# Patient Record
Sex: Male | Born: 1960 | Race: White | Hispanic: No | Marital: Single | Smoking: Current every day smoker
Health system: Southern US, Community
[De-identification: ages and names within clinical notes are randomized; demographics above are authoritative.]

## PROBLEM LIST (undated history)

## (undated) DIAGNOSIS — I10 Essential (primary) hypertension: Secondary | ICD-10-CM

## (undated) DIAGNOSIS — G8929 Other chronic pain: Secondary | ICD-10-CM

## (undated) DIAGNOSIS — F259 Schizoaffective disorder, unspecified: Secondary | ICD-10-CM

## (undated) DIAGNOSIS — E119 Type 2 diabetes mellitus without complications: Secondary | ICD-10-CM

## (undated) DIAGNOSIS — I219 Acute myocardial infarction, unspecified: Secondary | ICD-10-CM

## (undated) DIAGNOSIS — J449 Chronic obstructive pulmonary disease, unspecified: Secondary | ICD-10-CM

## (undated) DIAGNOSIS — Z91199 Patient's noncompliance with other medical treatment and regimen due to unspecified reason: Secondary | ICD-10-CM

## (undated) DIAGNOSIS — I469 Cardiac arrest, cause unspecified: Secondary | ICD-10-CM

## (undated) DIAGNOSIS — F1910 Other psychoactive substance abuse, uncomplicated: Secondary | ICD-10-CM

## (undated) DIAGNOSIS — Z9119 Patient's noncompliance with other medical treatment and regimen: Secondary | ICD-10-CM

## (undated) DIAGNOSIS — M549 Dorsalgia, unspecified: Secondary | ICD-10-CM

## (undated) DIAGNOSIS — F1020 Alcohol dependence, uncomplicated: Secondary | ICD-10-CM

## (undated) DIAGNOSIS — F1410 Cocaine abuse, uncomplicated: Secondary | ICD-10-CM

## (undated) DIAGNOSIS — F1320 Sedative, hypnotic or anxiolytic dependence, uncomplicated: Secondary | ICD-10-CM

## (undated) HISTORY — DX: Sedative, hypnotic or anxiolytic dependence, uncomplicated: F13.20

## (undated) HISTORY — PX: CYST EXCISION: SHX5701

## (undated) HISTORY — DX: Type 2 diabetes mellitus without complications: E11.9

## (undated) HISTORY — DX: Alcohol dependence, uncomplicated: F10.20

## (undated) HISTORY — DX: Essential (primary) hypertension: I10

## (undated) HISTORY — DX: Schizoaffective disorder, unspecified: F25.9

## (undated) HISTORY — DX: Cardiac arrest, cause unspecified: I46.9

## (undated) HISTORY — DX: Chronic obstructive pulmonary disease, unspecified: J44.9

## (undated) HISTORY — DX: Acute myocardial infarction, unspecified: I21.9

## (undated) HISTORY — DX: Cocaine abuse, uncomplicated: F14.10

---
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
## 2002-01-21 ENCOUNTER — Inpatient Hospital Stay (HOSPITAL_COMMUNITY): Admission: AD | Admit: 2002-01-21 | Discharge: 2002-01-26 | Payer: Self-pay | Admitting: Psychiatry

## 2002-09-19 ENCOUNTER — Emergency Department (HOSPITAL_COMMUNITY): Admission: EM | Admit: 2002-09-19 | Discharge: 2002-09-19 | Payer: Self-pay | Admitting: Emergency Medicine

## 2003-03-05 ENCOUNTER — Emergency Department (HOSPITAL_COMMUNITY): Admission: EM | Admit: 2003-03-05 | Discharge: 2003-03-05 | Payer: Self-pay | Admitting: Emergency Medicine

## 2003-11-12 ENCOUNTER — Inpatient Hospital Stay (HOSPITAL_COMMUNITY): Admission: AD | Admit: 2003-11-12 | Discharge: 2003-11-18 | Payer: Self-pay | Admitting: Psychiatry

## 2004-10-18 DIAGNOSIS — F259 Schizoaffective disorder, unspecified: Secondary | ICD-10-CM

## 2004-10-18 HISTORY — DX: Schizoaffective disorder, unspecified: F25.9

## 2004-12-01 ENCOUNTER — Emergency Department (HOSPITAL_COMMUNITY): Admission: EM | Admit: 2004-12-01 | Discharge: 2004-12-01 | Payer: Self-pay | Admitting: Emergency Medicine

## 2006-10-18 DIAGNOSIS — E119 Type 2 diabetes mellitus without complications: Secondary | ICD-10-CM

## 2006-10-18 DIAGNOSIS — I10 Essential (primary) hypertension: Secondary | ICD-10-CM

## 2006-10-18 HISTORY — DX: Type 2 diabetes mellitus without complications: E11.9

## 2006-10-18 HISTORY — DX: Essential (primary) hypertension: I10

## 2007-02-01 ENCOUNTER — Ambulatory Visit: Payer: Self-pay | Admitting: Cardiology

## 2007-08-17 ENCOUNTER — Inpatient Hospital Stay (HOSPITAL_COMMUNITY): Admission: EM | Admit: 2007-08-17 | Discharge: 2007-08-20 | Payer: Self-pay | Admitting: Emergency Medicine

## 2007-08-20 ENCOUNTER — Inpatient Hospital Stay (HOSPITAL_COMMUNITY): Admission: AD | Admit: 2007-08-20 | Discharge: 2007-08-28 | Payer: Self-pay | Admitting: Psychiatry

## 2007-08-20 ENCOUNTER — Ambulatory Visit: Payer: Self-pay | Admitting: Psychiatry

## 2007-08-31 ENCOUNTER — Observation Stay (HOSPITAL_COMMUNITY): Admission: EM | Admit: 2007-08-31 | Discharge: 2007-09-04 | Payer: Self-pay | Admitting: Emergency Medicine

## 2007-09-04 ENCOUNTER — Inpatient Hospital Stay (HOSPITAL_COMMUNITY): Admission: AD | Admit: 2007-09-04 | Discharge: 2007-09-11 | Payer: Self-pay | Admitting: Psychiatry

## 2007-09-13 ENCOUNTER — Emergency Department (HOSPITAL_COMMUNITY): Admission: EM | Admit: 2007-09-13 | Discharge: 2007-09-13 | Payer: Self-pay | Admitting: Emergency Medicine

## 2007-09-28 ENCOUNTER — Inpatient Hospital Stay: Payer: Self-pay | Admitting: Unknown Physician Specialty

## 2007-12-21 ENCOUNTER — Ambulatory Visit: Payer: Self-pay | Admitting: Cardiology

## 2008-02-10 ENCOUNTER — Emergency Department (HOSPITAL_COMMUNITY): Admission: EM | Admit: 2008-02-10 | Discharge: 2008-02-10 | Payer: Self-pay | Admitting: Emergency Medicine

## 2008-11-16 ENCOUNTER — Inpatient Hospital Stay (HOSPITAL_COMMUNITY): Admission: EM | Admit: 2008-11-16 | Discharge: 2008-11-19 | Payer: Self-pay | Admitting: Emergency Medicine

## 2008-11-18 ENCOUNTER — Ambulatory Visit: Payer: Self-pay | Admitting: Psychiatry

## 2008-11-19 ENCOUNTER — Inpatient Hospital Stay (HOSPITAL_COMMUNITY): Admission: AD | Admit: 2008-11-19 | Discharge: 2008-11-26 | Payer: Self-pay | Admitting: Psychiatry

## 2009-11-04 ENCOUNTER — Emergency Department (HOSPITAL_COMMUNITY): Admission: EM | Admit: 2009-11-04 | Discharge: 2009-11-04 | Payer: Self-pay | Admitting: Emergency Medicine

## 2011-02-01 LAB — URINALYSIS, ROUTINE W REFLEX MICROSCOPIC
Bilirubin Urine: NEGATIVE
Hgb urine dipstick: NEGATIVE
Ketones, ur: 40 mg/dL — AB
Nitrite: NEGATIVE
Nitrite: NEGATIVE
Protein, ur: NEGATIVE mg/dL
Specific Gravity, Urine: 1.01 (ref 1.005–1.030)
Specific Gravity, Urine: <1.005 — ABNORMAL LOW (ref 1.005–1.030)
Urobilinogen, UA: 0.2 mg/dL (ref 0.0–1.0)
pH: 6 (ref 5.0–8.0)

## 2011-02-01 LAB — BLOOD GAS, ARTERIAL
Bicarbonate: 16.1 mEq/L — ABNORMAL LOW (ref 20.0–24.0)
Bicarbonate: 17.3 mEq/L — ABNORMAL LOW (ref 20.0–24.0)
Bicarbonate: 20.7 mEq/L (ref 20.0–24.0)
FIO2: 0.21 %
FIO2: 0.21 %
O2 Saturation: 93.7 %
Patient temperature: 37
Patient temperature: 37
TCO2: 14.7 mmol/L (ref 0–100)
TCO2: 15.7 mmol/L (ref 0–100)
pCO2 arterial: 24 mmHg — ABNORMAL LOW (ref 35.0–45.0)
pCO2 arterial: 28.4 mmHg — ABNORMAL LOW (ref 35.0–45.0)
pCO2 arterial: 31.4 mmHg — ABNORMAL LOW (ref 35.0–45.0)
pH, Arterial: 7.403 (ref 7.350–7.450)
pH, Arterial: 7.432 (ref 7.350–7.450)
pH, Arterial: 7.435 (ref 7.350–7.450)
pH, Arterial: 7.443 (ref 7.350–7.450)
pO2, Arterial: 85.2 mmHg (ref 80.0–100.0)
pO2, Arterial: 93.1 mmHg (ref 80.0–100.0)

## 2011-02-01 LAB — BASIC METABOLIC PANEL
BUN: 10 mg/dL (ref 6–23)
BUN: 7 mg/dL (ref 6–23)
BUN: 7 mg/dL (ref 6–23)
BUN: 7 mg/dL (ref 6–23)
BUN: 8 mg/dL (ref 6–23)
CO2: 18 mEq/L — ABNORMAL LOW (ref 19–32)
CO2: 20 mEq/L (ref 19–32)
CO2: 23 mEq/L (ref 19–32)
CO2: 25 mEq/L (ref 19–32)
CO2: 27 mEq/L (ref 19–32)
Calcium: 8.5 mg/dL (ref 8.4–10.5)
Calcium: 8.6 mg/dL (ref 8.4–10.5)
Calcium: 8.9 mg/dL (ref 8.4–10.5)
Calcium: 8.9 mg/dL (ref 8.4–10.5)
Calcium: 9.5 mg/dL (ref 8.4–10.5)
Chloride: 107 mEq/L (ref 96–112)
Chloride: 109 mEq/L (ref 96–112)
Chloride: 110 mEq/L (ref 96–112)
Creatinine, Ser: 0.86 mg/dL (ref 0.4–1.5)
Creatinine, Ser: 0.91 mg/dL (ref 0.4–1.5)
Creatinine, Ser: 0.92 mg/dL (ref 0.4–1.5)
Creatinine, Ser: 0.97 mg/dL (ref 0.4–1.5)
GFR calc Af Amer: >60 mL/min (ref >60)
GFR calc Af Amer: >60 mL/min (ref >60)
GFR calc Af Amer: >60 mL/min (ref >60)
GFR calc non Af Amer: >60 mL/min (ref >60)
GFR calc non Af Amer: >60 mL/min (ref >60)
GFR calc non Af Amer: >60 mL/min (ref >60)
GFR calc non Af Amer: >60 mL/min (ref >60)
GFR calc non Af Amer: >60 mL/min (ref >60)
Glucose, Bld: 110 mg/dL — ABNORMAL HIGH (ref 70–99)
Glucose, Bld: 112 mg/dL — ABNORMAL HIGH (ref 70–99)
Glucose, Bld: 92 mg/dL (ref 70–99)
Glucose, Bld: 98 mg/dL (ref 70–99)
Potassium: 3.3 mEq/L — ABNORMAL LOW (ref 3.5–5.1)
Potassium: 3.5 mEq/L (ref 3.5–5.1)
Potassium: 3.9 mEq/L (ref 3.5–5.1)
Potassium: 4.3 mEq/L (ref 3.5–5.1)
Sodium: 137 mEq/L (ref 135–145)
Sodium: 140 mEq/L (ref 135–145)
Sodium: 140 mEq/L (ref 135–145)
Sodium: 140 mEq/L (ref 135–145)
Sodium: 142 mEq/L (ref 135–145)

## 2011-02-01 LAB — DIFFERENTIAL
Basophils Relative: 1 % (ref 0–1)
Eosinophils Absolute: 0.3 10*3/uL (ref 0.0–0.7)
Eosinophils Relative: 2 % (ref 0–5)
Lymphs Abs: 1.5 10*3/uL (ref 0.7–4.0)
Lymphs Abs: 1.9 10*3/uL (ref 0.7–4.0)
Monocytes Absolute: 0.7 10*3/uL (ref 0.1–1.0)
Monocytes Relative: 7 % (ref 3–12)
Neutro Abs: 5.7 10*3/uL (ref 1.7–7.7)
Neutrophils Relative %: 67 % (ref 43–77)

## 2011-02-01 LAB — CBC
Hemoglobin: 17.7 g/dL — ABNORMAL HIGH (ref 13.0–17.0)
MCHC: 34.3 g/dL (ref 30.0–36.0)
MCHC: 34.7 g/dL (ref 30.0–36.0)
MCV: 94.4 fL (ref 78.0–100.0)
MCV: 95.6 fL (ref 78.0–100.0)
Platelets: 123 10*3/uL — ABNORMAL LOW (ref 150–400)
WBC: 8.4 10*3/uL (ref 4.0–10.5)

## 2011-02-01 LAB — GLUCOSE, CAPILLARY: Glucose-Capillary: 118 mg/dL — ABNORMAL HIGH (ref 70–99)

## 2011-02-01 LAB — RAPID URINE DRUG SCREEN, HOSP PERFORMED
Amphetamines: NOT DETECTED
Opiates: NOT DETECTED
Tetrahydrocannabinol: NOT DETECTED

## 2011-02-01 LAB — URINALYSIS, DIPSTICK ONLY
Hgb urine dipstick: NEGATIVE
Nitrite: NEGATIVE
Nitrite: NEGATIVE
Protein, ur: NEGATIVE mg/dL
Specific Gravity, Urine: 1.01 (ref 1.005–1.030)
Urobilinogen, UA: 0.2 mg/dL (ref 0.0–1.0)
pH: 6 (ref 5.0–8.0)

## 2011-02-01 LAB — SALICYLATE LEVEL
Salicylate Lvl: 52 mg/dL (ref 2.8–20.0)
Salicylate Lvl: 67.7 mg/dL (ref 2.8–20.0)

## 2011-02-01 LAB — HEPATIC FUNCTION PANEL
ALT: 11 U/L (ref 0–53)
Alkaline Phosphatase: 50 U/L (ref 39–117)
Bilirubin, Direct: 0.1 mg/dL (ref 0.0–0.3)
Indirect Bilirubin: 0.2 mg/dL — ABNORMAL LOW (ref 0.3–0.9)
Total Bilirubin: 0.3 mg/dL (ref 0.3–1.2)

## 2011-02-01 LAB — HEMOGLOBIN A1C: Mean Plasma Glucose: 114 mg/dL

## 2011-02-02 LAB — GLUCOSE, CAPILLARY
Glucose-Capillary: 104 mg/dL — ABNORMAL HIGH (ref 70–99)
Glucose-Capillary: 60 mg/dL — ABNORMAL LOW (ref 70–99)
Glucose-Capillary: 76 mg/dL (ref 70–99)
Glucose-Capillary: 77 mg/dL (ref 70–99)
Glucose-Capillary: 79 mg/dL (ref 70–99)
Glucose-Capillary: 83 mg/dL (ref 70–99)
Glucose-Capillary: 89 mg/dL (ref 70–99)
Glucose-Capillary: 90 mg/dL (ref 70–99)
Glucose-Capillary: 91 mg/dL (ref 70–99)
Glucose-Capillary: 94 mg/dL (ref 70–99)
Glucose-Capillary: 94 mg/dL (ref 70–99)
Glucose-Capillary: 94 mg/dL (ref 70–99)
Glucose-Capillary: 95 mg/dL (ref 70–99)
Glucose-Capillary: 96 mg/dL (ref 70–99)

## 2011-02-02 LAB — COMPREHENSIVE METABOLIC PANEL
AST: 13 U/L (ref 0–37)
Albumin: 3.4 g/dL — ABNORMAL LOW (ref 3.5–5.2)
Chloride: 111 mEq/L (ref 96–112)
Creatinine, Ser: 0.77 mg/dL (ref 0.4–1.5)
GFR calc Af Amer: >60 mL/min (ref >60)
Potassium: 3.7 mEq/L (ref 3.5–5.1)
Sodium: 139 mEq/L (ref 135–145)
Total Bilirubin: 0.3 mg/dL (ref 0.3–1.2)

## 2011-02-02 LAB — DIFFERENTIAL
Basophils Absolute: 0 10*3/uL (ref 0.0–0.1)
Eosinophils Relative: 4 % (ref 0–5)
Lymphocytes Relative: 39 % (ref 12–46)
Monocytes Absolute: 0.4 10*3/uL (ref 0.1–1.0)

## 2011-02-02 LAB — CBC
Platelets: 112 10*3/uL — ABNORMAL LOW (ref 150–400)
WBC: 5.9 10*3/uL (ref 4.0–10.5)

## 2011-03-02 NOTE — Discharge Summary (Signed)
NAMEBARTOW, [name]                 ACCOUNT NO.:  [id number]   MEDICAL RECORD NO.:  [id number]           PATIENT TYPE:  INP   LOCATION:  IC03                          FACILITY:  [facility]   PHYSICIAN:  Dorris [name], DO    DATE OF BIRTH:  1960-10-20   DATE OF ADMISSION:  08/17/2007  DATE OF DISCHARGE:  11/02/2008LH                               DISCHARGE SUMMARY   ADMISSION DIAGNOSES:  1. Ethylene glycol overdose.  2. Methanol overdose.  3. History of hypertension.  4. History of diabetes.  5. History of schizophrenia.  6. History of suicidal ideation attempt.   DISCHARGE DIAGNOSES:  1. Suicide ideation.  2. History of suicide attempt.  3. History of methanol and ethylene glycol overdose.  4. History of hypertension.  5. History of diabetes.   PATIENT'S PRIMARY CARE PHYSICIAN:  He does not have one.   CONSULTS THAT WERE MADE:  1. Dr. Kristian Covey.  2. Dr. Juanetta Gosling.  3. Dr. Suzette Battiest, of Surgery.   PROCEDURES THAT WERE DONE:  Had a chest x-ray on October 31st that  demonstrated mild cardiac enlargement with minimal bibasilar  atelectasis.  Did not have any other tests done.   HISTORY AND PHYSICAL:  Please refer to his History and Physical that was  done by Dr. Juanetta Gosling.  To summarize, this is a 50 year old Caucasian male  who had a suicide attempt and drank about 8 ounces of Zerex antifreeze,  another 8 ounces of windshield cleaning solution and an unknown amount  of Zyrtec.  He presented to the ED.  At that point in time, Poison  Control was called and the right information was taken and patient was  admitted to the ICU in which Dr. Kristian Covey came to see him.  His ethanol  level was obtained which was at an ethanol level of 41.  At that point  in time, the patient was brought in to have the procedure done by  Surgery by Dr. Suzette Battiest, who placed a venous passive access for post  ethylene glycol ingestion.  Consent was performed and a Port-A-Cath  Gambro line was placed.  Dr. Kristian Covey  then followed the patient.  He was  dialyzed and also given up to six doses of Antizol per protocol for  overdose.  Patient continued to improve and his Perm-A-Cath was removed  and his ethylene glycol decreased below 20, last reading was at 18.  Patient began to eat and continued to improve.  At this point in time,  Behavioral Health came to see him.  It was determined that he could be  discharged to [facility] and he is medically stable to do that.   CONDITION:  Stable.   DISPOSITION:  To Behavioral Health.   He will be sent on current medications of Zoloft, no doses for his  medications given; Zoloft, aspirin, Glucophage, Klonopin, lisinopril,  Risperdal and Prevacid.  They will establish doses that he can get from  his family at this point in time.      Dorris [name], DO  Electronically Signed     CB/MEDQ  D:  08/20/2007  T:  08/20/2007  Job:  [id number]
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
## 2011-03-02 NOTE — Consult Note (Signed)
NAMEMARKEES, CARNS                 ACCOUNT NO.:  [id number]   MEDICAL RECORD NO.:  [id number]          PATIENT TYPE:  INP   LOCATION:  IC03                          FACILITY:  [facility]   PHYSICIAN:  Jorja Loa, M.D.DATE OF BIRTH:  03-15-1961   DATE OF CONSULTATION:  DATE OF DISCHARGE:                                 CONSULTATION   REASON FOR CONSULT:  Methanol over dose.   The patient is a 50 year old with past medical history of hypertension,  history of diabetes, mellitus, schizophrenia, presently came after  drinking about 3 glasses of antifreeze.  The patient has been, according  to him, stated that he was intending to kill himself but he did not  drink enough doses.  He has previous history where he tried to hang  himself but the rope broke down and the patient was at this  hospital  about 3 years ago.  Presently, patient feels sleepy otherwise.  No new  complaints.   PAST MEDICAL HISTORY:  As stated above, patient with:  1. History of diabetes.  2. History of hypertension.  3. History of schizophrenia.  4. History of hypothyroidism.   MEDICATIONS:  Consist of:  1. Folic acid 50 mg IV every 24 hours.  2. __________  IV every 12 hours.  3. __________ 100 mg IV every 6 hours.  4. Sodium bicarb 1 amp in IV fluid at 25 mL per hour.  5. He is also getting thiamine.   As an outpatient presently, patient seems to be on Zoloft, aspirin, and  Glucophage plus other medication for his __________ psychiatry is not  available.   ALLERGIES:  HE HAS NO KNOWN ALLERGY EXCEPT SEROQUEL.   SOCIAL HISTORY:  Occasional alcohol but today he denies drinking any  alcohol.   REVIEW OF SYSTEMS:  Complains of some sleepiness, otherwise no new  complaints.  He does not have any nausea.  No vomiting.  He denies any  chest pain, urgency, or frequency.   EXAMINATION:  His blood pressure is 108/74.  Heart rate was 79.  CHEST:  Clear to auscultation.  HEART:  Revealed regular rate and rhythm.   No murmur.  ABDOMEN:  Soft.  Positive bowel sound.  EXTREMITIES:  No edema.   His blood work today is a pH of 7.34, PCO2 of 36.9, and O2 saturation is  94.  His white blood cell count is 8.8, hemoglobin 15.1, hematocrit  42.1, platelets 103, sodium 136, potassium 3.5, BUN 5, creatinine 0.7,  calcium 9.2, phosphorus of 3.2.  His methanol level is 40.   ASSESSMENT:  1. Methanol overdose.  Presently, he is not acidotic, but his level is      very high according to poison control.  Their suggestion is to go      ahead and dialyze him.  At this moment, also, patient __________      seems to be more or less stable.  He is alert and talking.  He was      able to get out of bed to go to the bathroom.  He does not have any  sign of fluid overload.  2. History of hypertension.  Blood pressure seems to be reasonable.  3. History of diabetes.  He was on hypoglycemic agent.  4. History of schizophrenia.  5. Possible suicidal ideation.   RECOMMENDATIONS:  We will try to dialyze patient today after __________  and we will use 4K 2.5 calcium bath, start flow at 300 mL per minute,  and if we get high blood flow possibly we will dialyze him for 4 hours  but if not probably the patient require 5 hours, and we will check the  methanol level after dialysis.  We will continue with other treatments  while inpatient.      Jorja Loa, M.D.  Electronically Signed     BB/MEDQ  D:  08/18/2007  T:  08/18/2007  Job:  [id number]

## 2011-03-02 NOTE — Group Therapy Note (Signed)
[name], [name]                 ACCOUNT NO.:  [id number]   MEDICAL RECORD NO.:  [id number]          PATIENT TYPE:  INP   LOCATION:  IC03                          FACILITY:  [facility]   PHYSICIAN:  Dorris [name], DO    DATE OF BIRTH:  1961-08-11   DATE OF PROCEDURE:  08/20/2007  DATE OF DISCHARGE:                                 PROGRESS NOTE   This is ICU day #3.  Patient was admitted on August 17, 2007, for  ethylene glycol poisoning as well as methanol poisoning through  windshield wiper fluid.  Currently his ethylene glycol level is less  than 5.  They went ahead and pulled his femoral catheter and they have  discontinued treatment of Antizole for him at this point in time.  Patient is continuing to improve.  He is eating.  He has had a bowel  movements and now we will discuss with the ACT team possible placement  due to his continued suicidal ideations.   PHYSICAL EXAMINATION:  CURRENT VITAL SIGNS:  Blood pressure 137/64,  respirations 18, heart rate is stable.  GENERAL:  This is a 50 year old male who is well-developed, well-  nourished, currently in no acute distress.  HEENT:  Eyes are equal and reactive to light bilaterally.  Teeth are in  poor repair.  NECK:  Supple.  No masses noted.  HEART:  Regular rate and rhythm, no murmurs noted.  LUNGS:  Clear to auscultation bilaterally, no wheezing, rhonchi or  rales.  ABDOMEN:  Round, distended but soft and nontender.  No rebound or  rigidity noted.  EXTREMITIES:  Positive pulses, no ecchymosis, cyanosis or edema.   LABORATORY DATA:  Currently his CBC and CMET are pending but will review  those and adjust any changes as necessary.   ASSESSMENT/PLAN:  1. Ethylene glycol overdose.  2. Windshield wiper fluid overdose.  3. Suicidal ideation and attempt.   PLAN:  Patient currently is medically stable.  Will have the ACT team  come in and assess him today and discuss possible placement depending on  what is appropriate for him  at this time.  Will probably come and do an  addendum regarding his placement to facility.      Dorris [name], DO  Electronically Signed     CB/MEDQ  D:  08/20/2007  T:  08/20/2007  Job:  [id number]
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
## 2011-03-02 NOTE — Consult Note (Signed)
[name], [name]                 ACCOUNT NO.:  [id number]   MEDICAL RECORD NO.:  [id number]          PATIENT TYPE:  INP   LOCATION:  IC03                          FACILITY:  [facility]   PHYSICIAN:  Tilford Pillar, MD      DATE OF BIRTH:  January 24, 1961   DATE OF CONSULTATION:  08/17/2007  DATE OF DISCHARGE:                                 CONSULTATION   REASON FOR CONSULTATION:  Ethylene glycol/antifreeze ingestion.   HISTORY OF PRESENT ILLNESS:  Patient is a 50 year old white male with  apparent history of mental health disease, who earlier this evening,  ingested a bottle of antifreeze with apparent suicidal ideation.  He was  evaluated in the emergency department by Dr. Juanetta Gosling and was admitted to  intensive care unit for close observation.  During his portion of the  evaluation, there was some suggestion of acute renal failure, secondary  to the antifreeze ingestion.  Secondary to this, he was recommended for  placement of hemodialysis catheter for planned hemodialysis.  At this  point, surgical consultation was obtained.   PAST MEDICAL HISTORY:  Chart was reviewed at the patient's bedside.  No  history or suspicion of increased bleeding diathesis.   MEDICATIONS:  Patient has not been on any anticoagulation medications.   PHYSICAL EXAMINATION:  Patient was evaluated in the intensive care unit.  He is awake, alert.  He has a pleasant affect at this point, in no  apparent acute distress.  Evaluation of his right groin did not demonstrate any prior incisions or  scars suggestive of prior surgery or line placement.  Additionally,  there was 2+ palpable femoral pulse with similar 2+ dorsalis pedis,  posterior tibialis on the right.  There is no evidence of any peripheral  edema of the lower extremities.   LABORATORY:  Current laboratory results were evaluated and no evidence  of suspicion for bleeding diathesis.   ASSESSMENT AND PLAN:  Need for venous passive access for status post  ethylene glycol ingestion.  It was discussed at length with the patient  the risks and benefits of placement of a femoral PermCath/Gambro line  placement for utilization with hemodialysis.  Consent was obtained.  At  this point, we will plan to proceed with placement of a right femoral  PermCath line.      Tilford Pillar, MD  Electronically Signed     BZ/MEDQ  D:  08/18/2007  T:  08/18/2007  Job:  [id number]

## 2011-03-02 NOTE — H&P (Signed)
NAMECLEARENCE, VITUG                 ACCOUNT NO.:  [id number]   MEDICAL RECORD NO.:  [id number]          PATIENT TYPE:  INP   LOCATION:  6706                         FACILITY:  [facility]   PHYSICIAN:  Mobolaji B. Bakare, M.D.DATE OF BIRTH:  [birth year]   DATE OF ADMISSION:  08/31/2007  DATE OF DISCHARGE:  08/20/2007                              HISTORY & PHYSICAL   PRIORITY ADMISSION HISTORY AND PHYSICAL   PRIMARY CARE PHYSICIAN:  Unassigned.   CHIEF COMPLAINT:  Overdose on 20 tablets of 1000 mg Glucophage about 3  p.m. today.   HISTORY OF PRESENTING COMPLAINT:  Mr. Muscat is a 50 year old, Caucasian  male with history of schizophrenia and a previous suicidal attempt.  The  patient stated that intentionally took 20 tablets of 1000 mg Glucophage  about 3 p.m. today so that he could get out of the nursing home where he  resides.  He stated that he does not like the nursing home, and they  would not let him out.  Looking at the E-chart, the patient had an overdose of methanol and  ethylene glycol with antifreeze and windshield cleaning solution on the  30th of October 2008. He received a short course of hemodialysis at that  time. He was admitted to inpatient psychiatric unit by Dr. Electa Sniff on  the 2nd of November 2008.  His discharge date is not clear to me at this  point.   The patient was brought to the emergency room by EMS.  Upon arrival by  EMS, the patient was alert and oriented, his vitals were normal with a  heart rate of 96, respiratory rate of 16, blood glucose was 89.  Currently, patient complains of abdominal pain (epigastric in location)  without radiation.  No vomiting or diarrhea.  There is also left-sided  chest pain, both of which started after he took the overdose.  He denies  diaphoresis, nausea.  He is not short of breath.   REVIEW OF SYSTEMS:  The patient states he had diarrhea yesterday x2.  There was no associated abdominal pain at that time and no hematochezia.  He denies fever or chills, cough, and headaches.  He feels frustrated  that he could not get out of the group home.   PAST MEDICAL HISTORY:  1. Schizophrenia.  2. Previous suicide attempt. Most recent was October 30th 2008.  3. Alcohol abuse with detoxification.  4. Diabetes mellitus.  5. Psychosocial problems.  6. Psoriasis.   CURRENT MEDICATIONS:  1. Klonopin 0.5 mg p.o. t.i.d.  2. Risperdal 2 mg p.o. q.h.s.  3. Zoloft 50 mg p.o. daily.  4. Zestril 20 mg p.o. daily.  5. Hydrochlorothiazide 12.5 mg p.o. daily.  6. Aspirin 81 mg p.o. daily.  7. Glucophage 500 p.o. b.i.d.  8. Protonix 40 mg p.o. daily.   ALLERGIES:  No known drug allergies.   SOCIAL HISTORY:  Patient drinks alcohol and he would not ease under pain  the amount he drinks; however, he states that he has not drank alcohol  for some time because he does not have money.  He smokes one pack  per  day of cigarettes, and he is not been able to have a smoke since  yesterday; he ran out of money and cigarettes.  Patient lives in a  nursing home, I believe, [place].   FAMILY HISTORY:  His mother is deceased.   INITIAL VITALS IN THE EMERGENCY ROOM:  Temperature 97.1, blood pressure  103/64, pulse of 96, respiratory rate of 18, O2 SATs of 97%.   PHYSICAL EXAMINATION:  GENERAL:  Patient is awake, alert, oriented in  time, place, and person.  He is not in respiratory distress.  HEENT:  Mucous membrane moist.  NECK:  No elevated JVD, no carotid bruit.  LUNGS:  Clear clinically to auscultation.  CVS:  S1 and S2 are regular; no murmur and no gallop are heard.  ABDOMEN:  Distended, not tympanitic, bowel sounds present, not tender,  and no palpable organomegaly (the patient states that his abdomen is  usually distended, but he has not observed any new changes).  EXTREMITIES:  No pedal edema or calf tenderness.  The patient has a 10-  ml syringe hidden in his left sock without any syringe attached to it.  CNS:  No focal  neurological deficit.  SKIN:  Multiple skin lesions resembling a psoriatic rash.   INITIAL LABORATORY DATA:  Drug screen was negative for barbiturates,  cocaine, opiates, and cannabis.  It was positive for benzodiazepines  (patient is on Klonopin).  Urinalysis unremarkable, specific gravity of  1.011 and urine pH of 6.0.  Salicylate level is less than 4.  Alcohol  level is less than 5.  Acetaminophen level is 110.  Sodium 132,  potassium 4.1, chloride 99, bicarb 21, glucose 92, BUN 7, creatinine  1.35, bilirubin 0.7, alkaline-phosphatase 23, AST 22, ALT 23, total  protein 6.8, albumin 4.2, calcium 9.5, PT 13.9 and 1.1, white cells  10.1, hemoglobin 15.3, hematocrit 42.7, platelets 198, and normal  differential.   EKG shows normal sinus rhythm; no acute ST changes.   ASSESSMENT AND PLAN:  1. Intentional overdose with Glucophage and probable suicide attempt      (although patient denies this):  Patient has underlying      schizophrenia and clearly is at risk to harm himself.  He will be      admitted for observation and management of the overdose.  Will      check his lactic acid level, and repeat BMET in six hours and also      in the morning.  He will have a 24-hour sitter.  Will give one gram      per kilogram body weight of activated charcoal.  Blood glucose will      be checked hourly for six hours and then every two hours if stable.      Will give Protonix 40 mg intravenously, and p.r.n. Mylanta for      epigastric pain.  Psychiatric consult will be obtained.  2. Chest pain:  This is probably gastrointestinal related.      Nevertheless, we will cycle cardiac enzymes.  Electrocardiogram      showed no acute changes.  3. Alcohol abuse:  His alcohol level is less than 5 at this point and      is not demonstrating any withdrawal symptoms.  Will place on      thiamine and folate.  We will watch for withdrawal and give Ativan      p.r.n.  4. History of schizophrenia:  Will resume  Risperdal.  5. Anxiety:  Will  continue with Klonopin and Zoloft.  6. Tobacco abuse:  Will offer tobacco cessation counseling and give      nicotine patch.  7. Diabetes mellitus:  Will monitor blood glucose and place patient on      a regular diet for now given the fact he has taken overdose of      Glucophage.  Will check hemoglobin A1c to assess control.      Mobolaji B. [name] Downs, M.D.  Electronically Signed     MBB/MEDQ  D:  08/31/2007  T:  08/31/2007  Job:  [id number]

## 2011-03-02 NOTE — Discharge Summary (Signed)
[name], [name]                 ACCOUNT NO.:  [id number]   MEDICAL RECORD NO.:  [id number]          PATIENT TYPE:  INP   LOCATION:  IC03                          FACILITY:  [facility]   PHYSICIAN:  Dorris [name], DO    DATE OF BIRTH:  [birth year]   DATE OF ADMISSION:  08/17/2007  DATE OF DISCHARGE:  11/02/2008LH                               DISCHARGE SUMMARY   ADMISSION DIAGNOSES:  1. Ethylene glycol overdose.  2. Methanol overdose.  3. History of hypertension.  4. History of diabetes.  5. History of schizophrenia.  6. History of suicidal ideation attempt.   DISCHARGE DIAGNOSES:  1. Suicide ideation.  2. History of suicide attempt.  3. History of methanol and ethylene glycol overdose.  4. History of hypertension.  5. History of diabetes.   PATIENT'S PRIMARY CARE PHYSICIAN:  He does not have one.   CONSULTS THAT WERE MADE:  1. Dr. Kristian Covey.  2. Dr. Juanetta Gosling.  3. Dr. Suzette Battiest, of Surgery.   PROCEDURES THAT WERE DONE:  Had a chest x-ray on October 31st that  demonstrated mild cardiac enlargement with minimal bibasilar  atelectasis.  Did not have any other tests done.   HISTORY AND PHYSICAL:  Please refer to his History and Physical that was  done by Dr. Juanetta Gosling.  To summarize, this is a 50 year old Caucasian male  who had a suicide attempt and drank about 8 ounces of Zerex antifreeze,  another 8 ounces of windshield cleaning solution and an unknown amount  of Zyrtec.  He presented to the ED.  At that point in time, Poison  Control was called and the right information was taken and patient was  admitted to the ICU in which Dr. Kristian Covey came to see him.  His ethanol  level was obtained which was at an ethanol level of 41.  At that point  in time, the patient was brought in to have the procedure done by  Surgery by Dr. Suzette Battiest, who placed a venous passive access for post  ethylene glycol ingestion.  Consent was performed and a Port-A-Cath  Gambro line was placed.  Dr. Kristian Covey  then followed the patient.  He was  dialyzed and also given up to six doses of Antizol per protocol for  overdose.  Patient continued to improve and his Perm-A-Cath was removed  and his ethylene glycol decreased below 20, last reading was at 18.  Patient began to eat and continued to improve.  At this point in time,  Behavioral Health came to see him.  It was determined that he could be  discharged to [facility] and he is medically stable to do that.   CONDITION:  Stable.   DISPOSITION:  To Behavioral Health.   He will be sent on current medications of Zoloft, no doses for his  medications given; Zoloft, aspirin, Glucophage, Klonopin, lisinopril,  Risperdal and Prevacid.  They will establish doses that he can get from  his family at this point in time.      Dorris [name], DO  Electronically Signed     CB/MEDQ  D:  08/20/2007  T:  08/20/2007  Job:  [id number]

## 2011-03-02 NOTE — Discharge Summary (Signed)
[name], [name]                 ACCOUNT NO.:  [id number]   MEDICAL RECORD NO.:  [id number]          PATIENT TYPE:  INP   LOCATION:  6706                         FACILITY:  [facility]   PHYSICIAN:  Mobolaji B. Bakare, M.D.DATE OF BIRTH:  07/21/1961   DATE OF ADMISSION:  08/31/2007  DATE OF DISCHARGE:  09/04/2007                               DISCHARGE SUMMARY   PRIMARY CARE PHYSICIAN:  Unassigned.   FINAL DIAGNOSES:  1. Overdose on Glucophage (20 tablets of 1000 mg tablets).  2. Acute renal failure secondary to metformin.  3. Lactic acidosis secondary to metformin overdose.  4. Hyponatremia, resolved.  5. Hypokalemia, corrected.  6. Schizoaffective disorder.  7. Hypertension.  8. Type 2 diabetes mellitus.  9. Alcohol abuse.   PROCEDURES:  Chest x-ray done on August 31, 2007 showed increased  vascular congestion compatible with mild pulmonary interstitial edema.  Abdominal x-ray:  Normal bowel gas pattern.   CONSULTATIONS:  Psychiatric consult provided by Dr. Jeanie Sewer.   BRIEF HISTORY:  [name] is a 50 year old Caucasian male with history  of schizoaffective disorder and multiple episodes of suicidal attempts.  The most recent was on August 15, 2007 when he drank windshield washing  fluid and antifreeze.  He had temporary dialysis at that time in [facility] and was discharged to [facility] on August 20, 2007 and probably discharged on August 24, 2007.  The patient again  took Glucophage on August 31, 2007, 20 tablets of 1,000 mg.  This was  intentional according to patient, to get him out of the facility.  He  was subsequently evaluated in the emergency room.  Initial vital's were  normal.  Laboratory data revealed a normal bicarb.  Initial Lactic acid  level was 4.2.  He had a normal BUN and creatinine on admission, 7/1.35  respectively.  The patient was admitted and placed on suicidal watch.  Psychiatry was consulted.   HOSPITAL COURSE:  PROBLEM  #1:  INTENTIONAL OVERDOSE WITH GLUCOPHAGE:  The patient was managed symptomatically.  Serial BMET's were checked and  he was given activated charcoal in the emergency room, which was about  four hours of presentation.  He was started on D5 normal saline.  Glucophage was held during the course of hospitalization.  The patient  was seen in consultation by psychiatrist, Dr. Jeanie Sewer. He recommended  admission to psychiatric ward.   PROBLEM #2:  ACUTE RENAL FAILURE:  This is secondary to metformin and  lactic acidosis.  The patient's creatinine got worse during this  hospitalization to 2.56 on the second day of admission.  BUN was normal.  The patient was on lisinopril and hydrochlorothiazide prior to  hospitalization.  These were held during the course of hospitalization.  He was continued on IV fluids and lactic acidosis trended downwards and  subsequent BUN and creatinine also trended downwards and now normalized  at the time of discharge with BUN of 6 and creatinine of 0.76.  He is  making good urine.   PROBLEM #3:  DIABETES MELLITUS:  The patient's blood glucose was checked  initially q.4h.  and this was within normal limits.  His fasting blood  sugar at the time of discharge was 106 despite not having taken any oral  hypoglycemic agents.  Additionally, the patient was on sliding scale  insulin, maximum dose a day was 3 units of NovoLog.  His hemoglobin A1C  is 6.2, which is within normal limits.  It is not compelling to start  him on any oral hypoglycemic agent at this time since his blood glucose  is normal.  We recommend continuing to monitor fasting blood glucose and  if necessary, he may be restarted on oral hypoglycemic agents,  preferably not Glucophage given the risk of suicidal attempt with these  and concomitant complications.   PROBLEM #4:  ALCOHOL ABUSE:  The patient does have a history of alcohol  abuse and he was placed on alcohol withdrawal watch.  He did not go into   any withdrawal during this hospitalization.   PROBLEM #4:  TOBACCO ABUSE:  He was placed on a Nicotine patch during  the course of his hospitalization.   PROBLEM #5:  HYPONATREMIA:  The patient's serum sodium became low during  the course of hospitalization to 124.  He had a low serum osmolality of  262.  Urine sodium was 28.  Urine osmolality was also low at 124.  Zoloft was discontinued.  Sodium had normalized prior to his discharge.  Hyponatremia was felt to be probably secondary to medication (Zoloft).   PROBLEM #6:  HYPERTENSION:  The patient was previously on ACE inhibitor  and hydrochlorothiazide prior to hospitalization.  His ACE inhibitor  (lisinopril) was discontinued secondary to renal failure and  hydrochlorothiazide was held during this time.  He was started on  Norvasc 10 mg daily.  His blood pressure is well controlled at the time  of discharge.   DISCHARGE MEDICATIONS:  1. Norvasc 10 mg daily.  2. Klonopin 0.5 mg p.o. t.i.d.  3. Nicotine patch 21 mg daily.  4. Multivitamin one daily.  5. NovoLog sliding scale.  6. Risperdal 2 mg q.h.s.  7. Thiamine 100 mg p.o. daily.  8. Lamisil powder to groin rash b.i.d.  9. Aspirin 81 mg p.o. daily.  10.Protonix 40 mg daily.   DISCHARGE LABORATORY DATA:  Sodium 135, potassium 4.5, chloride 103,  bicarb 25, glucose 82, BUN 6, creatinine 0.76, calcium 9.2.  Hemoglobin  A1C 6.2.   CONDITION ON DISCHARGE:  Stable.   PHYSICAL EXAMINATION:  VITAL SIGNS:  At the time of discharge were  temperature 97.7, pulse of 68, respiratory rate 20, blood pressure  120/76.  Oxygen saturation of 95% on room air.   PENDING DATA:  Followup chest x-ray is pending at the time of this  dictation.      Mobolaji B. [name] Downs, M.D.  Electronically Signed     MBB/MEDQ  D:  09/04/2007  T:  09/04/2007  Job:  [id number]

## 2011-03-02 NOTE — Group Therapy Note (Signed)
NAMEDONIVIN, WIRT                 ACCOUNT NO.:  [id number]   MEDICAL RECORD NO.:  [id number]          PATIENT TYPE:  INP   LOCATION:  IC03                          FACILITY:  [facility]   PHYSICIAN:  Edward L. Juanetta Gosling, M.D.DATE OF BIRTH:  [birth year]   DATE OF PROCEDURE:  DATE OF DISCHARGE:                                 PROGRESS NOTE   PROBLEM:  Ethylene glycol and methanol ingestion in a suicide attempt.   SUBJECTIVE:  Mr. Jay says he is better.  He feels a little better.  He  has no new complaints.  He says he is sleepy.  He is undergoing dialysis  now.   Blood pressure is 129/77, pulse is 17.  He is afebrile.  His I&O -900  yesterday and +531 so far today.  His weight is unchanged at 103.2 kg.  Last blood gas shows pH 7.43, pCO2 of 41, pO2 of 61.  BUN is 5,  creatinine 0.73 yesterday.  We are awaiting today's results.  Otherwise  he is awake, alert.  No complaints of visual disturbance.   PLAN:  To continue his meds and treatments.  He is to undergo dialysis  is needed.  He is going to have an ACT team consult once he is medically  stable.  He is going to need to be admitted to a psychiatric institution  because of his suicide attempt.      Edward L. Juanetta Gosling, M.D.  Electronically Signed     ELH/MEDQ  D:  08/18/2007  T:  08/18/2007  Job:  [id number]

## 2011-03-02 NOTE — Discharge Summary (Signed)
[name], [name] NO.:  [id number]   MEDICAL RECORD NO.:  [id number]          PATIENT TYPE:  IPS   LOCATION:  0402                          FACILITY:  [facility]   PHYSICIAN:  Anselm Jungling, MD  DATE OF BIRTH:  1961/05/21   DATE OF ADMISSION:  08/20/2007  DATE OF DISCHARGE:  08/28/2007                               DISCHARGE SUMMARY   IDENTIFYING DATA AND REASON FOR ADMISSION:  This was an inpatient  psychiatric admission for [name], a 50 year old Caucasian male with a  history of schizophrenia.  He was admitted after drinking antifreeze and  methanol.  He came to [place] with a history of numerous previous suicide  attempts.  He had been living at __________  group home.  He claimed  that he had attempted suicide in response to mistreatment by other  patients at that facility.  Please refer to the admission note for  further details pertaining to the symptoms, circumstances and history  that led to his hospitalization.  He was given initial Axis I diagnoses  of schizophrenia, and history of alcohol abuse.   MEDICAL AND LABORATORY:  The patient was treated for his antifreeze and  methanol ingestion in the medical hospital.  Once transferred to our  facility, he was further assessed by the psychiatric nurse practitioner.  He had a previous history of hypertension, diabetes mellitus, GERD.  He  was treated with Prinivil, hydrochlorothiazide, aspirin, Glucophage,  Protonix, and he was on an insulin sliding scale that was  overseen by  the pharmacist and the nurse practitioner.  There were no acute medical  issues.   HOSPITAL COURSE:  The patient was admitted to the adult inpatient  psychiatric service.  He presented as a well-nourished, normally-  developed male who was alert, fully oriented, pleasant and fairly  polite.  He appeared depressed, and was withdrawn, staying in bed.  He  made no overtly delusional statements and denied auditory  hallucinations.  He denied  any further suicidal ideation.  His thoughts  however were disorganized, and the statements that he made regarding the  treatment that he had been subjected to at __________  group home  suggested paranoid referential thinking.   He was continued on a psychotropic regimen that included Klonopin,  Zoloft, and Risperdal.  We attempted to involve him in the therapeutic  milieu.  During his entire stay he was generally calm, pleasant, and  cooperative, although he had moments of frustration over delays in  finding a new residential placement for him.  He appeared to tolerate  his medications well.  They appeared to help stabilize sleep.   Our case manager, working with the patient, learned that the __________  group home would not accept the patient back.  Efforts were made to find  an appropriate residential group home for him.  There was some delay  accomplishing this, but the patient was eventually able to be discharged  on the 9th hospital day.   AFTERCARE:  The patient was to follow up at the [facility] with an  appointment with Darl Pikes __________  on September 06, 2007   DISCHARGE MEDICATIONS:  1. Klonopin 0.5 mg t.i.d.  2. Prinivil 20 mg daily.  3. Hydrochlorothiazide 12.5 mg daily.  4. Aspirin 81 mg daily.  5. Glucophage 500 mg b.i.d.  6. Protonix 40 mg daily.  7. Zoloft 50 mg daily.  8. Risperdal 2 mg q.h.s.   DISCHARGE DIAGNOSES:  Axis I:  Schizophrenia, chronic paranoid type, and  history of alcohol abuse.  Axis II:  Deferred.  Axis III:  History of hypertension, diabetes mellitus, gastroesophageal  reflux disease.  Axis IV:  Stressors, severe.  Axis V:  Global Assessment of Functioning on discharge 50.      Anselm Jungling, MD  Electronically Signed     SPB/MEDQ  D:  08/31/2007  T:  09/01/2007  Job:  [id number]

## 2011-03-02 NOTE — Op Note (Signed)
NAMEEDWARD, GUTHMILLER                 ACCOUNT NO.:  [id number]   MEDICAL RECORD NO.:  [id number]          PATIENT TYPE:  INP   LOCATION:  IC03                          FACILITY:  [facility]   PHYSICIAN:  Tilford Pillar, MD      DATE OF BIRTH:  January 30, 1961   DATE OF PROCEDURE:  08/18/2007  DATE OF DISCHARGE:                               OPERATIVE REPORT   PREOPERATIVE DIAGNOSIS:  Ethylene glycol ingestion requiring  hemodialysis.   POSTOPERATIVE DIAGNOSIS:  Ethylene glycol ingestion requiring  hemodialysis.   PROCEDURE:  Right femoral vein PermCath placement for dialysis.   SURGEON:  Tilford Pillar, M.D.   ANESTHESIA:  1% lidocaine for local anesthetic.   ESTIMATED BLOOD LOSS:  Minimal.   INDICATIONS FOR PROCEDURE:  This is an unfortunate 50 year old male with  apparent history of mental illness who presented to [facility] emergency department after ingesting a bottle of antifreeze.  He initially evaluated in the emergency department by Ramon Dredge L. Juanetta Gosling,  M.D. and was admitted to the intensive care unit.  At this point due to  the ethylene glycol ingestion, it had been recommended for hemodialysis  for treatment.  Benefits were discussed with the patient in regards to  planned placement of a femoral PermCath.  Consent was obtained.   DESCRIPTION OF PROCEDURE:  The patient was placed in the supine position  in his intensive care unit bed.  His right groin was prepped with a  Betadine solution.  Sterile drapes were placed.  At this point a  __________  was utilized to place the right femoral line.  1% lidocaine  utilized for local anesthetic.  During the injection of the local  anesthetic, aspiration was used to identify the right femoral vein.  Venous return was obtained.  At this point an 18 gauge introducer needle  was utilized to again locate the right femoral vein.  Good venous return  was obtained.  Wire was advanced without difficulty.  The needle was  removed.   At this point Seldinger technique was utilized to place the  PermCath hemodialysis catheter over the wire into the right femoral  vein.  At this point, the patient tolerated the procedure well.  Both  ports of the PermCath were aspirated easily with good venous return.  Both catheters were flushed with normal saline as planned and no heparin  solution was used at this point as hemodialysis was began immediately  following placement of the line.  At this point, sterile caps were  placed on the end of both ports and the catheter was secured to the  groin with 2-0 silk suture.  At this point a sterile dressing was  placed.  A Tegaderm  dressing was utilized to secure the gauze over the catheter.  Drapes  were removed.  Sharps were disposed of according to protocol.  The  patient tolerated the procedure well, hemodialysis is to begin  immediately following completion of this placement.      Tilford Pillar, MD  Electronically Signed     BZ/MEDQ  D:  08/18/2007  T:  08/18/2007  Job:  [id number]   cc:   Ramon Dredge L. Juanetta Gosling, M.D.  Fax: [phone]

## 2011-03-02 NOTE — H&P (Signed)
[name], [name] NO.:  [id number]   MEDICAL RECORD NO.:  [id number]          PATIENT TYPE:  IPS   LOCATION:  0403                          FACILITY:  [facility]   PHYSICIAN:  Anselm Jungling, MD  DATE OF BIRTH:  09/18/1961   DATE OF ADMISSION:  11/19/2008  DATE OF DISCHARGE:                       PSYCHIATRIC ADMISSION ASSESSMENT   PATIENT IDENTIFICATION:  A 50 year old male who was involuntarily  petitioned on November 19, 2008.   HISTORY OF PRESENT ILLNESS:  The patient is here on petition papers  stating the patient has a history of schizophrenia, paranoid type and  made a suicide attempt by intentionally overdosing on taking 77 aspirin  tablets.  It was noted that the patient apparently was going to a  sister's home in the middle of a snowstorm, was found by the police, and  the patient had admitted taking aspirin and was taken then to the  emergency room and admitted for overdose of 70 tablets of 325 mg aspirin  tablets.  The patient was assessed at [facility], had  approximately 3-4 days stay for salicylate overdose and is in our  facility for assessment of overdose.   PAST PSYCHIATRIC HISTORY:  The patient had a history of an overdose in  2008 and was in our facility at that time.   SOCIAL HISTORY:  The patient lives in a group home and has a court date  pending on February 16 for apparent shoplifting charge.   FAMILY HISTORY:  Unknown.   ALCOHOL AND DRUG HABITS:  No apparent alcohol or drug use.   PRIMARY CARE [name]:  Unclear.   MEDICAL PROBLEMS:  1. Insulin-dependent diabetes.  2. Chronic back pain.  3. Currently is status post salicylate overdose.   MEDICATIONS:  1. The patient is discharged on Cogentin 1 mg h.s.  2. Risperdal 1 mg h.s.  3. Darvocet as needed.  4. Klonopin 1 mg t.i.d.  5. Metformin 1000 mg b.i.d.  6. Zoloft 200 mg daily.   DRUG ALLERGIES:  ZYPREXA AND SEROQUEL.   PHYSICAL EXAMINATION:  GENERAL:  This is a  middle-aged male.  He is in  no acute distress.  He is complaining of a toothache and left lower jaw  has a broken tooth noted.  Otherwise, he offers no other complaints and  appears in no acute distress.  VITAL SIGNS:  Temperature 97, 9, 78 heart rate, 20 respirations, blood  pressure is 131/85, 6 feet one inches tall, 211 pounds.   LABORATORY DATA:  Shows urinalysis was negative.  Glucose of 110.  Acetaminophen level less than 10.  Alcohol level less than 5.  Urine  drug screen negative.  Initial salicylate level was 67.7 on January 30,  down to 15.7 with a normal reference range on February 1.   MENTAL STATUS EXAM:  The patient is awake and alert and sitting in the  day room, cooperative, asking medially who was when I approached him.  He is appropriately dressed, good eye contact.  Speech is normal pace  and tone, clear.  Mood is complaining of pain.  The patient is  calm and  cooperative, agreeable to options to controlling his tooth pain.  He is  wanting to go home and mildly agitated in regards to that.  Thought  processes.  Denies any suicidal or homicidal thoughts.  Denies any  hallucinations.  Cognitive function:  He seems to be aware of himself  and situation and place.  Judgment:  Poor insight is none.   DISCHARGE DIAGNOSES:  AXIS I:  Mood disorder NOS, rule out psychosis  NOS, rule out alcohol abuse.  AXIS II:  Deferred.  AXIS III:  Salicylate toxicity which is resolved type 2 diabetes.  AXIS IV:  Psychosocial problems related to burden of illness.  AXIS V:  Current is 30.   PLAN:  Plan to continue medications listed up on the discharge summary.  Will check his blood sugar twice a day.  Will continue to identify  support, comorbidities and stressors.  Reinforce medication compliance  at follow-up.  Case manager will assess housing situation and follow-up.  Tentative length of stay at this time is 3-4 days.      Landry Corporal, N.P.      Anselm Jungling, MD   Electronically Signed    JO/MEDQ  D:  11/20/2008  T:  11/20/2008  Job:  [phone]

## 2011-03-02 NOTE — H&P (Signed)
[name], [name]                 ACCOUNT NO.:  [id number]   MEDICAL RECORD NO.:  [id number]          PATIENT TYPE:  INP   LOCATION:  IC03                          FACILITY:  [facility]   PHYSICIAN:  Edward L. Juanetta Gosling, M.D.DATE OF BIRTH:  [birth year]   DATE OF ADMISSION:  08/17/2007  DATE OF DISCHARGE:  LH                              HISTORY & PHYSICAL   REASON FOR ADMISSION:  Drug overdose.   HISTORY:  [name] is a 50 year old who said that he was trying to kill  himself and drank about 8 ounces, he thinks, of Zerex antifreeze and he  also drank about 8 ounces of a windshield cleaning solution and took an  unknown amount of Zyrtec.  He says that it did not work fast enough so  he went ahead and came to the emergency room.  He has a previous history  of psychiatric illness with at least one previous suicide attempt.  He  has multiple psychiatric admissions, however - [facility]  in [place] about 3 years ago, [facility] about 5 years ago,  [facility] about 7 years ago.  He has had previous alcohol  detoxification but I am not sure exactly when.  According to the last  history available from the psychiatrist, he had tried to hang himself  twice, he had overdosed to kill himself, and he had been cutting himself  as well.   SOCIAL HISTORY:  He is 50 years old.  He is on disability.  He smokes  about a pack of cigarettes daily.   FAMILY HISTORY:  Apparently his mother died in the last several weeks,  and   Dictation ended at this point.      Edward L. Juanetta Gosling, M.D.  Electronically Signed     ELH/MEDQ  D:  08/17/2007  T:  08/18/2007  Job:  [id number]

## 2011-03-02 NOTE — H&P (Signed)
[name], [name] NO.:  [id number]   MEDICAL RECORD NO.:  [id number]          PATIENT TYPE:  IPS   LOCATION:  0507                          FACILITY:  [facility]   PHYSICIAN:  Geoffery Lyons, M.D.      DATE OF BIRTH:  10/16/1961   DATE OF ADMISSION:  09/04/2007  DATE OF DISCHARGE:                       PSYCHIATRIC ADMISSION ASSESSMENT   This is a 50 year old male voluntarily admitted on September 04, 2007.   HISTORY OF PRESENT ILLNESS:  The patient presents with a history of  intentional overdose on 20 tablets of 1000 mg Glucophage he states that  he did this because of his roommate and the TV in the group home where  he resides.  Patient states that he was hoping to leave after the  overdose and he thought that what was on the TV was related to him.  He  was recently discharged from [facility] after an overdose on  antifreeze.   PAST PSYCHIATRIC HISTORY:  The patient has had a few admissions to  [facility], was recently discharged after an overdose on  antifreeze, has a history of schizophrenia.  Sees Dr. Betti Cruz.   SOCIAL HISTORY:  50 year old divorced male who has two adult children.  The patient has been living in a group home.   FAMILY HISTORY:  Is none.   INDICATIONS:  Nonsmoker.  No current alcohol or drug use.   MEDICAL HISTORY:  Primary care Chanah Tidmore is unknown.  Medical problems  are type 2 diabetes, hypertension and GERD.  Medication:  Has been on  Klonopin 0.5 mg t.i.d., discharged on medications Norvasc 10 mg daily,  Klonopin 0.5 mg p.o. t.i.d. nicotine patch daily 21 mg, multivitamin one  daily.  NovoLog sliding scale, Risperdal 2 mg at bedtime and thiamine  100 mg daily, Lamisil powder to groin rash b.i.d., aspirin 81 mg daily  and  Protonix 40 mg daily.   DRUG ALLERGY:  SEROQUEL, unclear about the patient's side effects to  medication.   PHYSICAL EXAM:  The patient was fully assessed over at [facility].  He is  unkempt today, appears anxious but no physical distress  noted.  His temperature is 97.9, 62 heart rate, 20 respirations, blood  pressure 135/71.   LABORATORY DATA:  Hemoglobin A1c is 6.2.  His lactic acid was 2.8 with a  reference range of 0.5-2.2.  Initial sodium on 11/13 was 131, on day of  discharge the patient's sodium was within normal limits at 135. Troponin  level was 0.03.   MENTAL STATUS EXAM:  He is unkempt, cooperative, poor eye contact,  somewhat anxious.  He has little spontaneity in conversation.  Mood is  anxious.  The patient again also appears very anxious, hearing voices.  Thought process:  Ideas of reference and delusions and paranoid  ideation.  Cognitive function: He is grossly oriented, judgment and  insight poor, concentration this time seems to be intact.   AXIS I:  Schizoaffective disorder.  AXIS II:  Deferred.  AXIS III:  Diabetes, hypertension and psoriasis  AXIS IV:  Problems with housing, other psychosocial  problems related to  burden of illness, medical problems, lack of social support.  AXIS V:  Current is 25.   PLAN:  To contract for safety.  Stabilize his mood and thinking.  We  will continue to monitor his CBGs, will resume his discharge  medications.  Case manager is to assess his living situation.  Will  continue to do reality testing.  Tentative length of stay at this time  is 5-7 days.      Landry Corporal, N.P.      Geoffery Lyons, M.D.  Electronically Signed    JO/MEDQ  D:  09/07/2007  T:  09/07/2007  Job:  [id number]

## 2011-03-02 NOTE — H&P (Signed)
[name], [name]                 ACCOUNT NO.:  [id number]   MEDICAL RECORD NO.:  [id number]          PATIENT TYPE:  INP   LOCATION:  IC03                          FACILITY:  [facility]   PHYSICIAN:  Edward L. Juanetta Gosling, M.D.DATE OF BIRTH:  13-Jun-1961   DATE OF ADMISSION:  08/17/2007  DATE OF DISCHARGE:  LH                              HISTORY & PHYSICAL   CONTINUATION   SOCIAL HISTORY:  He does drink large amounts of alcohol, he has a hard  time quantifying it, and he smokes about a pack of cigarettes daily.   He does not have any known drug allergies.   PHYSICAL EXAMINATION:  Shows that he is awake and alert.  He is able to  converse.  His pulse is in the 70s, O2 saturations in the mid 90s, blood  pressure 120/70.  He is afebrile.  HEENT:  Shows his pupils are equal, round, react to light and  accommodation.  Nose and throat are clear.  NECK:  Supple without masses.  CHEST:  Fairly clear.  HEART:  Regular without murmur, gallop or rub.  ABDOMEN:  Soft without masses.  He does not have any tenderness.  EXTREMITIES:  Showed a lot of scarring on his extremities but no edema.  CENTRAL NERVOUS SYSTEM EXAMINATION:  Grossly intact.   LABORATORY WORK THUS FAR:  Alcohol level less than 5.  Drug screen shows  benzodiazepines, otherwise negative.  Comprehensive metabolic profile:  Sodium is 130, potassium 3.4, chloride 97, CO2 of 20, glucose 123, BUN  5, creatinine 0.73.  His liver functions so far normal.  Urinalysis is  essentially negative.  Blood gas on room air shows pO2 71, pCO2 of 30,  pH 7.32.  His CBC:  White count 8800, hemoglobin 15.1, platelets 103.   ASSESSMENT:  He has had an intentional suicide attempt with the use of  antifreeze and windshield wiper cleaning solution.  He has a methyl  alcohol level that has been sent to [facility] and pending.  He is  currently hemodynamically stable.  He has been started on fomepizole at  the suggestion of Poison Control.  He is going to be  on thiamine,  vitamin B6, folic acid.  He is going to have blood gases every 2 hours  to check to see if his pH drops.  He is going to have serum osmolality,  another BMET, he is going to have a BMET at 0400, and he is going to be  followed closely in the intensive care unit.      Edward L. Juanetta Gosling, M.D.  Electronically Signed    ELH/MEDQ  D:  08/17/2007  T:  08/18/2007  Job:  [id number]

## 2011-03-02 NOTE — Consult Note (Signed)
[name], [name] NO.:  [id number]   MEDICAL RECORD NO.:  [id number]           PATIENT TYPE:   LOCATION:                                 FACILITY:   PHYSICIAN:  Antonietta Breach, M.D.  DATE OF BIRTH:  1961/03/16   DATE OF CONSULTATION:  09/02/2007  DATE OF DISCHARGE:                                 CONSULTATION   REQUESTING PHYSICIAN:  [facility]   REASON FOR CONSULTATION:  Psychosis.   HISTORY OF PRESENT ILLNESS:  [name] is a 50 year old male  admitted to the [facility] on August 31, 2007, due to an overdose.   [name] has been residing in a nursing home.  He overdosed on twenty  1000-mg tablets of Glucophage.  He states that when he was watching the  TV at the nursing home, there was a court TV show on.  He had an idea of  reference that meant to him that he was going to be going back to court  soon.  He became catastrophically anxious and took the overdose.  He  also cites difficulty with other residents as a stress.   [name] continues with approximately 4 days of psychosis.  The  psychosis mainly includes florid ideas of reference.  The sitter that  has been sitting with him today witnessed the patient having an idea of  reference with the TV which involved some bizarre delusion about being  buried somewhere.  The other ideas of reference involves words such as  when the patient was asked do you enjoying any music, he stated that's  just a problem, M U C is U ick, and that's you.   The patient has not been combative.  He is cooperative with bedside  care.  He is motivated for treatment and has been taking his  medications.  He is grossly oriented to all spheres.  His memory  function is intact.  His thought processes overall are coherent.   He has been maintained on a psychotropic medication.  Please see the  discussion below.  It is unclear whether he has been taking his  Risperdal 2 mg daily, however, he denies not taking  it.   PAST PSYCHIATRIC HISTORY:  [name] has a history of involuntary  commitment to the [facility] in April 2003.  On  review of the past medical record, he had auditory hallucinations at  that time.  He was discharged on Risperdal as well as Paxil.  The  patient does have a history of depression and anxiety as well.   Other diagnoses in the past medical record include schizoaffective  disorder.   The patient has a history of at least 2 self-hanging attempts and  another overdose.  He has been admitted to [facility] as well  as [facility].   He was also involuntarily committed to the [facility]  in January 2005.  He was trying to hang himself with an extension cord  at that time.  He was discharged on Paxil and Risperdal at that  point.   In October of this year, the patient overdosed on antifreeze.  This led  to an admission to [facility] for general medical treatment,  followed by an admission to [facility] again. The  patient was discharged on Klonopin 0.5 mg t.i.d., Zoloft 50 mg q. day,  and Risperdal 2 mg daily.   FAMILY PSYCHIATRIC HISTORY:  None known.   SOCIAL HISTORY:  [name] used to work in [employer].  He is now  medically disabled and retired.  He has been married twice and is  divorced.  He has 2 children in their 65s.  They apparently live in  [place].  The patient used to live with his mother in [place].   The patient has a history of excessive alcohol use, and he has had  several DWIs.  He went to prison for his DWIs for some time.   He does not do any illegal drugs.   He is now residing at a nursing home prior to this admission.   PAST MEDICAL HISTORY:  1. Diabetes mellitus.  2. Psoriasis.   ALLERGIES:  SEROQUEL.   MEDICATIONS:  MAR reviewed.  The psychotropics include:  1. Klonopin 0.5 mg t.i.d.  2. Risperdal 2 mg nightly.  3. Zoloft 50 mg q. day.  4. Ativan 1-2 mg  q.2 hours p.r.n.   LABORATORY DATA:  Sodium 124, potassium 30, BUN 11, creatinine 1.32,  SGOT 23, SGPT 20, hemoglobin A1c 6.2 with a high limit of normal 6.1.  Tylenol negative.  Urine drug screen positive for benzodiazepines;  aspirin negative, alcohol negative.   REVIEW OF SYSTEMS:  CONSTITUTIONAL:  Afebrile.  No weight loss.  HEAD:  No trauma.  EYES:  No visual changes.  EARS:  No hearing impairment.  NOSE:  No rhinorrhea.  MOUTH/THROAT:  No sore throat.  NEUROLOGIC:  No focal motor or sensory changes.  PSYCHIATRIC:  As above.  CARDIOVASCULAR:  No chest pain, palpitations.  RESPIRATORY:  No coughing or wheezing.  GASTROINTESTINAL:  No nausea, vomiting, diarrhea.  GENITOURINARY:  No dysuria.  SKIN:  Unremarkable.  ENDOCRINE/METABOLIC:  No heat or cold intolerance.  The hemoglobin A1c  brings into question possible noncompliance with a number of his  medicines including Glucophage and possibly his psychotropics.  MUSCULOSKELETAL:  No deformities.  HEMATOLOGIC/LYMPHATIC:  Unremarkable.   EXAMINATION:  VITAL SIGNS:  Temperature 97.6, pulse 65, respiratory rate  22, blood pressure 115/54, O2 saturation on room air 98%.  GENERAL APPEARANCE:  [name] is a middle-aged male, sitting up in his  hospital bed.  He has no abnormal involuntary movements.  OTHER MENTAL STATUS EXAM:  [name] is alert.  His eye contact is good.  His attention span is grossly within normal limits.  His concentration  is slightly decreased.  He is oriented to all spheres.  His memory is  intact to immediate recent and remote.  His fund of knowledge and  intelligence are estimated to be mildly below average.  His speech  involves normal rate and prosody without dysarthria.  Affect is anxious.  Mood is anxious.  Thought process is coherent.  There are no looseness  of associations.  However, there is alogia thought content.  Please see  the history of present illness.  Insight is poor.  Judgment is  impaired.   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   Rule out 295.70, schizoaffective disorder versus schizophrenia, chronic  undifferentiated type with  acute exacerbation, along with depressive  disorder not otherwise specified.   Alcohol dependence, currently in remission.   AXIS II:  None.  AXIS III:  See general medical section above.  AXIS IV:  Primary support group, general medical.  AXIS V:  15.   [name] is demonstrating ongoing psychosis which is placing him at  risk for self-harm and lethal self-neglect.   The undersigned provided ego-supportive psychotherapy.   RECOMMENDATIONS:  Would continue his Risperdal 2 mg daily for  antipsychosis on the average.  This should be an adequate antipsychotic  dose.  On review of his past psychiatric record, this should be adequate  for him given that he may have been noncompliant with medications such  as cheeking them.   This patient may be a candidate for clozapine next, however, the  consideration of clozapine will be deferred until an adequate retrial of  Risperdal and the patient moving on to a psychiatric inpatient  admission.   Would continue Klonopin 0.5 mg t.i.d. for antiacute anxiety, as well as  Ativan 1 mg to 2 mg q.2 hours p.r.n. agitation.   Regarding the Zoloft, the patient does have some hyponatremia at this  time.  Therefore, before proceeding with the Zoloft for anti depression,  Zoloft should be considered in the differential of hyponatremia  etiologies.   Would continue the sitter and low stimulation ego support.   Would admit the patient to a psychiatric hospital as soon as he is  medically cleared.      Antonietta Breach, M.D.  Electronically Signed     JW/MEDQ  D:  09/03/2007  T:  09/04/2007  Job:  [id number]

## 2011-03-02 NOTE — Group Therapy Note (Signed)
[name], [name]                 ACCOUNT NO.:  [id number]   MEDICAL RECORD NO.:  [id number]          PATIENT TYPE:  INP   LOCATION:  IC03                          FACILITY:  [facility]   PHYSICIAN:  Dorris [name], DO    DATE OF BIRTH:  [birth year]   DATE OF PROCEDURE:  DATE OF DISCHARGE:                                 PROGRESS NOTE   The patient seen today in the ICU.  ICU day #3.  He was admitted on  October 30 for ethylene glycol poisoning as well as windshield wiper  poisoning, and took an unknown amount of Zyrtec as well.  He has a  previous history of psychiatric illness, and has attempted suicide in  the past before.  Currently, the poison control center is involved and  we have been monitoring his ethylene glycol levels.  Yesterday his level  was 41.  He came in on the 30th with it being 84.  Currently, he is  getting hemodialysis, and he has had 5 doses of the Antizol.  He has  been alert and oriented and was able to increase his diet yesterday,  complaining of abdominal distension, has not had a bowel movement, so we  will go ahead and see if that relieves it.  If not, we will consider  doing possible x-ray if needed.  He seems to be improving, at least from  a clinical standpoint, and has remained stable, but we will go ahead and  continue to monitor him.   His vitals are as follows:  His blood pressure is 137/64, respirations  18, and heart rate is 84.  GENERALLY:  This is a 50 year old male who is well developed and well  nourished and currently in no acute distress.  HEENT:  Eyes are equal and reactive to light bilaterally.  Teeth are in  poor repair.  NECK:  Supple, no masses noted.  HEART:  Regular rate and rhythm.  LUNGS:  Clear to auscultation bilaterally.  No wheezes, rales, or  rhonchi noted.  ABDOMEN:  Round, distended, but soft, nontender.  No rebound or rigidity  noted.  Legs are positive pulses and no ecchymosis, cyanosis or edema noted.   His labs:  Will  do labs for this morning.  He did have an ABG done, pH  was 7.3 and O2 66.6, bicarbonate of 24.2.   ASSESSMENT AND PLAN:  1. Ethylene glycol overdose.  2. Windshield wiper overdose.   PLAN:  Will continue to monitor his ethylene glycol levels with a repeat  done today, and will continue him on hemodialysis and the Antizol until  his levels are within normal limits.  Also will continue to monitor his  vital signs  to make sure that he stays stable.  Until his levels are brought down to  an acceptable level we will continue to monitor that.  Once the patient  is stable, we will assess him and plan on moving him down to step down  for further care.      Dorris [name], DO  Electronically Signed     CB/MEDQ  D:  08/19/2007  T:  08/19/2007  Job:  [id number]

## 2011-03-02 NOTE — H&P (Signed)
NAMEKIENAN, DOUBLIN NO.:  [id number]   MEDICAL RECORD NO.:  [id number]          PATIENT TYPE:  IPS   LOCATION:  0402                          FACILITY:  [facility]   PHYSICIAN:  Anselm Jungling, MD  DATE OF BIRTH:  Mar 06, 1961   DATE OF ADMISSION:  08/20/2007  DATE OF DISCHARGE:                       PSYCHIATRIC ADMISSION ASSESSMENT   HISTORY OF PRESENT ILLNESS:  The patient is a transfer from [facility] after the patient intentionally overdosed on antifreeze.  The  patient drank approximately 8 ounces of antifreeze and 8 ounces of  windshield cleaning solution.  He states he did this at the group home  and his intention was that he was hoping to leave the group home with  the overdose.  He states that he has had problems sleeping in the group  home due to his roommate.  He denies any psychotic symptoms,  hallucinations.  He states his appetite has been satisfactory and denies  any alcohol or drug use.  He was dialyzed at the [facility] due  to the overdose.   PAST PSYCHIATRIC HISTORY:  The patient was here before.  This is the  third admission.  He sees Dr. Betti Cruz for outpatient mental health  services.  He has a history of multiple suicide attempts by trying to  hang himself and has been detoxed from alcohol in the past.   SOCIAL HISTORY:  A 50 year old divorced male, homeless, was living in  __________  Group Home prior to this admission.   FAMILY HISTORY:  None that we are aware of.   ALCOHOL AND DRUG USE:  The patient smokes.  Denies any alcohol or drug  use.   PRIMARY CARE Katheryne Gorr:  Is unknown.   MEDICAL PROBLEMS:  Non-insulin-dependent diabetes mellitus and  hypertension.   MEDICATIONS:  The patient has been on Klonopin, Glucophage and Risperdal  along with metformin and Prilosec daily.   DRUG ALLERGIES:  SEROQUEL, THE PATIENT REPORTED A PROBLEM WITH  DRY  MOUTH.   PHYSICAL EXAM:  This is a middle-aged male who is disheveled.  He  is  complaining of constipation for 4 days.  Otherwise denied any  complaints.  He was fully assessed at [facility].  His  temperature today is 98.6, 60 for heart rate, 20 respirations, blood  pressure 150/97, 99% saturated.  His chest x-ray showed mild cardiac  enlargement and minimal basilar atelectasis.  Potassium is 3.3, platelet  count was down at 119, ethylene glycol/ level was 41, BUN was 2.   MENTAL STATUS EXAM:  He is alert and cooperative, no eye contact.  He is  unkempt.  His speech is very concrete.  His mood is depressed.  The  patient's affect is flat.  Thought processes:  There is no evidence of  any thought disorder.  No delusional statements, cognitive function  intact.  His memory is fair.  Judgment and insight is impaired   Axis I:  Schizoaffective disorder, suicide attempt by overdose on  antifreeze.  Axis II:  Deferred.  Axis III:  Non-insulin-dependent diabetes mellitus and hypertension.  Axis IV:  Problems with housing, medical problems, other psychosocial  problems.  Axis V:  Current is 30.   PLAN:  Contract for safety, stabilize his mood and thinking.  We will  clarify his medications per the group home.  We will check his CBGs.  Put patient on a 60 g modified carbohydrate diet.  Will assess him for  comorbidities.  Case manager is to assess his living situation.  His  tentative length of stay is 5-7 days.      Landry Corporal, N.P.      Anselm Jungling, MD  Electronically Signed    JO/MEDQ  D:  08/24/2007  T:  08/25/2007  Job:  [id number]

## 2011-03-05 NOTE — Discharge Summary (Signed)
NAMEMEKEL, HAVERSTOCK                           ACCOUNT NO.:  [id number]   MEDICAL RECORD NO.:  [id number]                   PATIENT TYPE:  IPS   LOCATION:  0407                                 FACILITY:  [facility]   PHYSICIAN:  Geoffery Lyons, M.D.                   DATE OF BIRTH:  03/21/1961   DATE OF ADMISSION:  11/12/2003  DATE OF DISCHARGE:  11/18/2003                                 DISCHARGE SUMMARY   CHIEF COMPLAINT AND PRESENT ILLNESS:  This was the second admission to [facility] for this 50 year old divorced white male  involuntarily committed.  History of commitment.  Tried to hang himself with  an extension cord.  He admitted that he tried to kill himself.  Feeling  hopeless, helpless.  Felt alone.  No one was coming to visit him.  He has no  license to drive.  He has nothing to live for.  Has been compliant with  medications.  Sleep is satisfactory.  Appetite has been satisfactory.   PAST PSYCHIATRIC HISTORY:  Second time at [facility].  Hospitalized  at [facility] and [facility].  Been detoxed from alcohol in the past.  Seen  as an outpatient at [facility].   ALCOHOL/DRUG HISTORY:  As already stated, history of heavier use of alcohol.  At this time, drinks two beers a day.  Admits he gets more paranoid when he  drinks.   PAST MEDICAL HISTORY:  Non-insulin-dependent diabetes mellitus, arterial  hypertension, hypothyroidism.   MEDICATIONS:  Lotensin 10 mg daily, Levoxyl 150 mg daily, Prevacid 30 mg  daily, Xanax XR 3 mg in the morning, Risperdal 2 mg, 1/2 in the morning and  4 mg at night, Paxil CR 25 mg daily, Zocor 20 mg daily, Glucovance 2.5 mg  twice a day.   PHYSICAL EXAMINATION:  Performed and failed to show any acute findings.   LABORATORY DATA:  Urine drug screen was negative.  Glucose was 180.  BUN was  6, sodium 131.  Hemoglobin 17.1, hemoglobin A1C 9.5.  Cholesterol 249,  triglycerides 362.   MENTAL STATUS EXAM:   Unkempt middle-age male.  Cooperative.  Fair eye  contact.  Speech was clear. Mood was depressed.  Affect was flat.  Some mild  irritability.  Thought processes were positive for paranoia and some  delusions.  Cognition well-preserved.   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   AXIS II:  No diagnosis.   AXIS III:  1. Non-insulin-dependent diabetes mellitus.  2. Hypercholesterolemia.  3. Hypertension.  4. Hypothyroidism.   AXIS IV:  Moderate.   AXIS V:  Global Assessment of Functioning upon admission 25; highest Global  Assessment of Functioning in the last year 55-60.   HOSPITAL COURSE:  He was admitted and started intensive individual and group  psychotherapy.  He  was placed on Zocor 20 mg per day, Paxil 25 mg per day,  naproxen 150 mg per day, Xanax XR 3 mg in the morning, Prevacid 30 mg daily,  Glucovance 2.5 mg in the morning and at night, trazodone 100 mg at bedtime,  Risperdal 1 mg in the morning and 4 mg at night, Lotensin 10 mg daily,  lisinopril 10 mg daily and Ambien 10 mg for sleep.  Paxil was increased to  37.5 mg.  As he was placed on his medications, the main issue became that he  was wanting to go home but he was willing to be placed in a group home.  Endorsed he was hearing the voices but then he felt it was getting better.  He admitted that he had gone off his medication and, once the medication was  reinstituted, he felt better about it.  He remained anxious, ruminating and  worried.  We started working to find a group home for him to go.  He was  basically minimizing symptoms, stating that he was ready to be discharged  but we encouraged him to wait until we got a place for him.  Still having a  hard time with the commitment.  He was afraid it could happen anytime,  though it was clarified that the main reason why he was committed was  because he was not doing well and it was because he had  quit taking his  medication.  He seemed to understand these and developed some insight in  terms of the need to stay on them.  He continued to improve and, on November 18, 2003, he was in full contact with reality.  There were no suicidal  ideation, no homicidal ideation, no hallucinations, no delusions.  He was  excited because he was leaving.  He was going to go home and willing to  follow up on an outpatient basis.   DISCHARGE DIAGNOSES:   AXIS I:  Schizoaffective disorder.   AXIS II:  No diagnosis.   AXIS III:  1. Non-insulin-dependent diabetes mellitus.  2. Arterial hypertension.  3. Hypercholesterolemia.  4. Hypothyroidism.   AXIS IV:  Moderate.   AXIS V:  Global Assessment of Functioning upon discharge 50.   DISCHARGE MEDICATIONS:  1. Zocor 20 mg daily.  2. Synthroid 150 mg daily.  3. Xanax XR 3 mg daily.  4. Protonix 40 mg daily.  5. DiaBeta 2.5 mg in the morning and at bedtime.  6. Glucophage 500 mg in the morning and at bedtime.  7. Trazodone 100 mg at bedtime as needed for sleep.  8. Risperdal 1 mg in the morning and 2 at night.  9. Paxil CR 37.5 mg daily.  10.      Lotensin 10 mg daily.   FOLLOW UP:  [facility].                                               Geoffery Lyons, M.D.    IL/MEDQ  D:  12/04/2003  T:  12/05/2003  Job:  [id number]

## 2011-03-05 NOTE — H&P (Signed)
[facility]  Patient:    [name], [name] Visit Number: [id number] MRN: [id number]          Service Type: PSY Location: 400 0405 01 Attending Physician:  Rachael Fee Dictated by:   Young Berry Scott, N.P. Admit Date:  01/21/2002                     Psychiatric Admission Assessment  DATE OF ASSESSMENT:  January 22, 2002 at 10 a.m.  IDENTIFYING INFORMATION:  This is a 50 year old Caucasian male who is an involuntary commitment.  HISTORY OF PRESENT ILLNESS:  This 50 year old male was brought to the emergency room by his mother because he was threatening to kill himself by injecting himself with motor oil.  He had told his mother also that he had taken 30 Xanax on the prior night, that would have been January 20, 2002.  He states today "I took the Xanax to go to sleep and, if I died, it didnt matter."  The patient reports a long past history of alcohol and other substance abuse.  He states that he has been "drinking pretty heavy for about the past six months."  He states "that means up to about a 12-pack of beer per day."  His mother reported that he was drinking up to a case of beer per day for the last few months.  The patient reports taking his medications only sporadically while he was drinking.  He stopped his Topamax approximately four days ago because he thought it might be making him sick but he is unable to be more specific.  He does complain of having some auditory hallucinations, hearing his mothers voice calling his name.  He denies any suicidal ideation or any homicidal ideation.  He does continue to think he is hearing his mother voice.  No visual hallucinations.  The patient had been scheduled for an involuntary commitment for treatment at [facility] this coming Friday, January 26, 2002.  However, he says "I was too impatient to wait any longer."  PAST PSYCHIATRIC HISTORY:  The patient is followed at [facility].  He has a  history of prior admissions at [facility] and at [facility].  This is his first [facility] admission.  His medical history taken in the emergency room reports that he has no history of prior suicide attempts but does have a history of prior suicidal ideation.  SOCIAL HISTORY:  The patient reports he was educated through the eighth grade. Dropped out of school because he did not like it.  He worked in [employer] for several years.  Last worked in 1996.  He has been married twice, divorced twice and he has two children, ages 59 and 68, by his first marriage, who live with their mother in [place].  The patient, himself, lives with his mother in [place] and receives disability for his mental health problems. He was in prison for several years for driving under the influence.  He denies that he has ever harmed others under the influence of alcohol.  He is unclear on when he was released from prison.  FAMILY HISTORY:  The patient is unclear about this.  ALCOHOL/DRUG HISTORY:  The patient states he smokes 1-1/2-2 packs per day of cigarettes.  The patients urine drug screen is positive for benzodiazepines and barbiturates.  His alcohol use is noted above.  He denies any use of other drugs of abuse.  MEDICAL HISTORY:  The  patient is followed by Dr. Claudie Revering in [place], [name].  Medical problems are hypothyroid, diabetes mellitus, type 2, hypertension, obesity, psoriasis.  Past medical history is remarkable for a questionable myocardial infarction.  The patient was told that he might have had a mild myocardial infarction several years ago.  He has been hospitalized in the past for his diabetes in 1999 and for problems with his gallbladder approximately six months ago.  Previous surgeries include excision of a benign cyst from his anterior chest sometime in the past and an incision and drainage of an abscess on his buttocks.  MEDICATIONS:  Confirmed by  calling [employer] in [place], [place] at [phone].  His medications include Risperdal 4 mg p.o. q.h.s., Prevacid 30 mg daily, Vicodin 5 mg p.o. p.r.n. for joint pain, Xanax 1 mg p.o. q.i.d. p.r.n., Arthrotec 75 mg p.o. b.i.d., Humulin R insulin 10 U at 9 a.m., 10 U at 9 p.m., Humulin N insulin 25 U q.a.m. and 15 U at 9 p.m., Gabitril 4 mg p.o. q.d., Topamax 100 mg p.o. q.d., Combivent inhaler daily, Advair Diskus 25/50 daily, Zyrtec 10 mg daily, Plavix 75 mg daily, Cogentin 1 mg p.o. q.d., Prinivil 10 mg daily.  DRUG ALLERGIES:  No known drug allergies.  POSITIVE PHYSICAL FINDINGS:  The patients physical examination was done at [facility] Emergency Room and is thoroughly documented in the record. Vital signs, on admission to the unit, are temperature 98.1, pulse 97, respirations 20, blood pressure 148/101 this morning.  LABORATORY DATA:  Fasting CBG this morning was 197.  His CMET reveals an elevated glucose in the emergency room of 190, sodium mildly decreased at 135. His SGPT is 58, his BUN is 6.0, his creatinine 0.7.  CBC revealed that WBC normal at 7.5, his platelets are mildly decreased at 128.  His alcohol level in the emergency room was less than 5.  Additional labs pending include a hemoglobin A1C, thyroid panel and routine urinalysis.  MENTAL STATUS EXAMINATION:  This is an obese disheveled male with poor hygiene.  He is Transport planner.  He has a very protuberant abdomen, is large built. He has a blunted and dulled affect and displays a moderate amount of psychomotor slowing.  He is cooperative, although he seems a bit perplexed and is having difficulty with recalling his past history.  His speech is slowed with a normal tone with some spontaneity noted.  Mood is mildly depressed and a bit perplexed.  He is still attempting to get himself oriented this morning although he is fully alert.  Thought process is slowed.  He is having occasional auditory  hallucinations.  No evidence of suicidal or homicidal ideation this morning.  His primary concerns this morning are in getting  something to drink and notifying his family that he is in here.  He does have some subjective complaints of mild paranoia but he does not appear guarded or delusional.  He is able to give appropriate answers.  Cognitively, he is intact to person and place but he is unclear on time or day.  DIAGNOSES: Axis I:    1. Alcohol abuse; rule out dependence.            2. Schizoaffective disorder by history.            3. Anxiety disorder not otherwise specified. Axis II:   Deferred. Axis III:  1. Diabetes mellitus, type 2.            2. Hypothyroidism.  3. Psoriasis.            4. Hypertension.            5. Obesity. Axis IV:   Deferred at this time. Axis V:    Current 30; past year 23.  PLAN:  Involuntarily admit the patient to detox him from alcohol and to evaluate his auditory hallucinations and his mood.  Our goal is for a safe detox and to alleviate his previous suicidal ideation.  We have elected to start him on a phenobarbital protocol without a loading dose and he will be getting a tapering dose today.  We will make sure we have his complete records from [facility] ER visit yesterday.  Meanwhile, we will restart his previous medications.  We are going to hold his Gabitril at this time.  Since his compliance has been sporadic in the past, we will also not restart his Topamax at this time, especially since he has been noncompliant.  However, we will restart his other medications.  We are going to hold his Xanax at this time until we get an idea of the frequency of his anxiety attacks.  We will do close monitoring of his CBGs q.i.d. at this time and of his vital signs as we start his Prinivil and attempt to get his blood pressure back down under control.  ESTIMATED LENGTH OF STAY:  Seven days. Dictated by:   Young Berry Scott,  N.P. Attending Physician:  Rachael Fee DD:  01/22/02 TD:  01/22/02 Job: [id number]

## 2011-03-05 NOTE — Discharge Summary (Signed)
[facility]  Patient:    [name], [name] Visit Number: [id number] MRN: [id number]          Service Type: PSY Location: 400 0405 01 Attending Physician:  Rachael Fee Dictated by:   Reymundo Poll Dub Mikes, M.D. Admit Date:  01/21/2002 Discharge Date: 01/26/2002                             Discharge Summary  CHIEF COMPLAINT AND HISTORY OF PRESENT ILLNESS:  This was the first admission to [facility] for this 50 year old male brought to the emergency room by his mother, threatening to kill himself, injecting himself with motor oil.  Was claiming to have taken 30 Xanax the night before. Claimed he was took it to go to sleep and if he died, it would not matter. Long history of alcohol and other substance abuse, drinking pretty heavy for about the past six months, a 12 pack of beer per day.  Just took the medications probably when he was drinking.  Was using Topamax and he stopped because he felt it was going to make him sick.  Complained of some auditory hallucinations hearing his mothers voice calling his name but denied any suicidal or homicidal ideas.  PAST PSYCHIATRIC HISTORY:  [facility].  History of prior admission to [facility]  First time at [facility]  SUBSTANCE ABUSE HISTORY:  As already stated.  PAST MEDICAL HISTORY:  Hypertension, diabetes mellitus type 2, hypothyroidism, obesity, psoriasis.  MEDICATIONS ON ADMISSION:  1. Risperdal 4 mg at night.  2. Prevacid 30 mg daily.  3. Vicodin 5 mg p.o. as needed for joint pain.  4. Xanax 1 mg four times a day.  5. Arthrotec 10/75 mg twice a day.  6. Humulin R insulin 10 units at 9 in the morning, 10 units at 9 p.m.  7. Humulin N insulin 25 units in the morning, 15 units p.m.  8. Gabitril 4 mg every day.  9. Topamax 100 mg every day. 10. Combivent inhaler daily. 11. Advair Diskus. 12. Zyrtec 10 mg daily. 13.  Plavix 25 mg daily. 14. Cogentin 1 mg daily. 15. ______ 10 mg daily.   PHYSICAL EXAMINATION:  GENERAL:  Performed and failed to show any acute findings.  LABORATORY DATA:  CBG 187.  GPT 58.  BUN 6.0, creatinine 0.7.  CBC: Within normal limits.  MENTAL STATUS EXAMINATION ON ADMISSION:  Obese, disheveled male, poor hygiene, unshaven, protuberant abdomen, large built.  Blunted, dull affect.  Displayed a moderate amount of psychomotor slowing.  Cooperative although a bit perplexed, having difficulty recalling the past history.  Slow speech, some spontaneity.  Mood: Mildly depressed.  Thought processes: Question of auditory hallucinations, no evidence of suicidal or homicidal ideas.  Cognitive: Well preserved.  ADMITTING DIAGNOSES: Axis I:    1. Alcohol dependence.            2. Schizoaffective disorder.            3. Anxiety disorder, not otherwise specified. Axis II:   No diagnosis. Axis III:  1. Diabetes mellitus type 2.            2. Hypothyroidism.            3. Psoriasis.            4. Hypertension.            5. Obesity. Axis  IV:   Deferred. Axis V:    Global assessment of functioning upon admission 30, highest global            assessment of functioning in the last year 58.  HOSPITAL COURSE:  He was admitted and started in intensive individual and group psychotherapy.  He was placed back on his medications.  He was detoxified using phenobarbital.  He was placed on Paxil 37.5 mg every day, Synthroid 0.15 every day, Risperdal 4 mg at bedtime.  He started improving, looking at what led to his admission, tried to sleep better as the hospitalization progressed, was hearing some voices, felt that the appetite was coming back.  Continued to improve and on April 11, he was in full contact with reality, was fully detoxified, no suicidal ideas, no homicidal ideas, no active psychosis.  Willing to abstain from alcohol long-term and willing to pursue further outpatient  treatment.  DISCHARGE DIAGNOSES: Axis I:    1. Schizoaffective disorder, depressed.            2. Alcohol dependence.            3. Anxiety disorder, not otherwise specified. Axis II:   No diagnosis. Axis III:  1. Diabetes mellitus type 2.            2. Hypothyroidism.            3. Psoriasis.            4. Arterial hypertension. Axis IV:   Moderate. Axis V:    Global assessment of functioning upon discharge 55.  DISCHARGE MEDICATIONS:  1. Paxil CR 37.5 mg daily.  2. Synthroid 0.15 mg every day.  3. Risperdal 2 mg two at bedtime.  4. Cogentin 1 mg at bedtime.  5. Plavix 75 mg every day.  6. Prevacid 30 mg one daily.  7. Zestril 10 mg every 12 hours.  8. Combivent inhaler.  9. Advair. 10. Humulin insulin. 11. Humulin Regular insulin.  FOLLOWUP:  [facility]. Dictated by:   Reymundo Poll Dub Mikes, M.D. Attending Physician:  Rachael Fee DD:  02/28/02 TD:  03/02/02 Job: [id number]

## 2011-03-05 NOTE — H&P (Signed)
[name], [name]                           ACCOUNT NO.:  [id number]   MEDICAL RECORD NO.:  [id number]                   PATIENT TYPE:  IPS   LOCATION:  0407                                 FACILITY:  [facility]   PHYSICIAN:  Geoffery Lyons, M.D.                   DATE OF BIRTH:  1961-07-01   DATE OF ADMISSION:  11/12/2003  DATE OF DISCHARGE:  11/18/2003                                HISTORY & PHYSICAL   IDENTIFYING INFORMATION:  This is a 50 year old divorced white male who was  involuntarily committed on November 12, 2003.   HISTORY OF PRESENT ILLNESS:  The patient presents with a history of  commitment.  Papers state that the patient tried to hang himself with an  extension cord.  The patient states that he tried to kill himself.  He feels  hopeless and helpless, feels that he is alone, no one comes to visit him.  He has no license to drive.  He feels he has nothing to live for.  He states  he has been compliant with his medication.  He states he sleeps  satisfactory.  Appetite has been satisfactory.  He does state that the TV is  broadcasting his life.  He is denying any current psychotic symptoms.   PAST PSYCHIATRIC HISTORY:  Second hospitalization at [facility], was here approximately 3 years ago.  He has been hospitalized at  [facility] and [place], other psychiatric admissions, has a history of  being detoxed from alcohol in the past, is an outpatient at [facility]  He reports a history of 2 hangings and overdosing to kill  himself and history of cutting himself.   SOCIAL HISTORY:  He is a 50 year old divorced white male, has 2 children  ages 97 and 22.  He lives with his mother.  He is on disability.  Has  DUI.  Has been in jail 4 times, the last in 2002.  Completed the 8th grade.   FAMILY HISTORY:  None.   ALCOHOL DRUG HISTORY:  The patient smokes.  He states he drinks 2 beers a  day gets more paranoid when he does.  His last drink was on  Tuesday prior to  this admission.   PAST MEDICAL HISTORY:  Primary care Adlean Hardeman is Dr. Sherril Croon in [place].  Medical  problems are non-insulin-dependent diabetes mellitus, hypertension,  hypothyroidism.   MEDICATIONS:  Lotensin 10 mg daily, Levoxyl 150 ml daily, Prevacid 30 mg,  Xanax XR 3 mg q.a.m., Risperdal 2 mg 1/2 in the morning, 4 mg at bedtime,  Paxil CR 25 mg daily, Zocor 20 mg daily, Glucovance 2.5 mg b.i.d..   DRUG ALLERGIES:  No known allergies.   PHYSICAL EXAMINATION:  Was done at [facility] where it was stated the  patient was in a catatonic state.  His vital signs today are stable, 97.8,  89 heart  rate, 20 respirations.  Blood pressure is 126/81.  He is somewhat  unkempt, complains of some numbness to his right hand 4th and 5th fingers.  He does have a strong right radial pulse.  His urine drug screen is  negative.  Glucose was 180, BUN is 6, sodium is 131.  Alcohol level less  than 5.  Hemoglobin is 17.1.   MENTAL STATUS EXAM:  He is an unkempt middle-aged male, cooperative.  Fair  eye contact.  Speech is clear, mood is depressed, affect is flat.  There is  some mild irritability.  Thought process is positive paranoia, positive  delusions.  Cognitive function intact.  Memory is fair, judgment and insight  poor.  He is a poor historian.   ADMISSION DIAGNOSES:   AXIS I:  1. Schizophrenia.  2. Major depression, recurrent, severe.   AXIS II:  Deferred.   AXIS III:  Non-insulin-dependent diabetes mellitus, elevated cholesterol,  hypertension, hypothyroidism.   AXIS IV:  Problems with primary support group, lack of support, occupation,  other psychosocial problems, medical problems.   AXIS V:  Current is 25, past year 55-60.   PLAN:  Involuntary commitment for suicide attempt.  The patient will be  placed in the 400 hall for close monitoring.  We will check the patient's  CBGs, put the patient on a modified carbohydrate diet.  Will have Motrin  available as an  anti-inflammatory as the patient complains of numbness to  fingers of is right hand.  Will increase the patient's antidepressant, will  have a session with the patient's mother.  Case manager to consider  placement.  The patient is to follow up with mental health and to continue  to be medication compliant.   TENTATIVE LENGTH OF CARE:  3-5 days or more depending on if the patient will  need placement.     Landry Corporal, N.P.                       Geoffery Lyons, M.D.    JO/MEDQ  D:  11/18/2003  T:  11/18/2003  Job:  [id number]
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
## 2011-03-05 NOTE — Discharge Summary (Signed)
[name]                 ACCOUNT NO.:  [id number]   MEDICAL RECORD NO.:  [id number]          PATIENT TYPE:  IPS   LOCATION:  0403                          FACILITY:  BH   PHYSICIAN:  Anselm Jungling, MD  DATE OF BIRTH:  [birth year]   DATE OF ADMISSION:  11/18/2008  DATE OF DISCHARGE:  11/25/2008                               DISCHARGE SUMMARY   IDENTIFYING DATA AND REASON FOR ADMISSION:  This was an inpatient  psychiatric admission for [name], a 50 year old single Caucasian male  with a history of psychotic disorder.  He had been living in a group  home, but needed psychiatric hospitalization in the aftermath of an  overdose.  Please refer to the admission note for further details  pertaining to the symptoms, circumstances and history that led to his  hospitalization.  He was given an initial Axis I diagnosis of mood  disorder NOS, rule out psychosis NOS, and rule out polysubstance abuse.   MEDICAL AND LABORATORY:  The patient was medically and physically  assessed by the psychiatric nurse practitioner.  This followed his  medical clearance for his overdose.  He came to [place] with a history of  diabetes mellitus, and was continued on a regimen of Glucophage and  Novolin insulin.  Darvocet was used on an as-needed basis for pain.  There were no acute medical issues.   HOSPITAL COURSE:  The patient was admitted to the adult inpatient  psychiatric service.  He presented as a well-nourished, normally-  developed male who was alert, fully oriented, and a poor historian.  He  appeared to be of low intelligence and poor education.  His thoughts  appeared well organized, however, and there were no overtly delusional  ideations or statements.  His insight and judgment appeared to be poor.  He reported that he had just been released from prison in December.  He  had also recently been arrested for shoplifting from [place].  His  remark regarding this was that he was not sure why they  were so  concerned about what he had shoplifted since they had gotten it back.   The patient was involved in the therapeutic milieu and he was treated  with a psychotropic regimen that included Risperdal, Cogentin, Zoloft,  and Klonopin.  He was generally pleasant and cooperative, which was felt  to be his baseline level of functioning, even though he remained poor  insight and judgment.   We contacted the group home that he had been living at, and they were  unwilling to accept him back.  As such, a new group home needed to be  found for him.  The patient worked closely with the case manager  regarding such efforts.  The patient remained free of suicidal ideation  throughout the remainder of his stay.   He was ultimately accepted by the staff of the [facility] in  [place].  He was discharged on the 9th hospital day.   AFTERCARE:  The patient was to follow up for psychiatric aftercare at  [facility] in [place] with an appointment  on November 28, 2008 at 8 a.m.  The patient was also instructed to follow up with his  medical physician within 2 weeks regarding his diabetic management.   DISCHARGE MEDICATIONS:  Glucophage 1000 mg b.i.d., Klonopin 1 mg t.i.d.,  sertraline 200 mg q.a.m., Risperdal 1 mg q.h.s., Cogentin 1 mg q.h.s.,  NovoLog insulin 10 units a.m. and supper, and Darvocet p.r.n. pain.   DISCHARGE DIAGNOSES:  AXIS I:  Schizoaffective disorder not otherwise  specified.  AXIS II:  Deferred.  AXIS III:  History of diabetes mellitus.  AXIS IV:  Stressors:  Severe.  AXIS V:  Global assessment of functioning on discharge 50.      Anselm Jungling, MD  Electronically Signed     SPB/MEDQ  D:  11/27/2008  T:  11/27/2008  Job:  [id number]

## 2011-07-13 LAB — RAPID URINE DRUG SCREEN, HOSP PERFORMED
Amphetamines: NOT DETECTED
Tetrahydrocannabinol: NOT DETECTED

## 2011-07-13 LAB — CBC
Hemoglobin: 13.1
Platelets: 228
RDW: 15.1

## 2011-07-13 LAB — COMPREHENSIVE METABOLIC PANEL
ALT: 54 — ABNORMAL HIGH
AST: 37
Albumin: 4
Alkaline Phosphatase: 54
GFR calc Af Amer: >60
Potassium: 3.6
Sodium: 133 — ABNORMAL LOW
Total Protein: 6.6

## 2011-07-13 LAB — ETHANOL

## 2011-07-27 LAB — DIFFERENTIAL
Basophils Absolute: 0
Eosinophils Absolute: 0.5
Eosinophils Absolute: 0.5
Eosinophils Relative: 7 — ABNORMAL HIGH
Eosinophils Relative: 5
Lymphocytes Relative: 28
Lymphs Abs: 2.8
Monocytes Relative: 7

## 2011-07-27 LAB — CBC
HCT: 40.2
HCT: 44.2
Hemoglobin: 13.8
Hemoglobin: 15.5
MCHC: 35
MCHC: 35.1
MCV: 97.9
MCV: 98.6
Platelets: 198
RBC: 4.07 — ABNORMAL LOW
RBC: 4.51
RBC: 4.4
RDW: 12.8
WBC: 6.6
WBC: 7.2

## 2011-07-27 LAB — COMPREHENSIVE METABOLIC PANEL
ALT: 23
AST: 18
AST: 22
AST: 23
Albumin: 3.7
Albumin: 4.2
Alkaline Phosphatase: 49
BUN: 2 — ABNORMAL LOW
CO2: 30
Calcium: 9.5
Chloride: 102
Chloride: 97
Creatinine, Ser: 0.69
GFR calc Af Amer: >60
GFR calc Af Amer: 33 — ABNORMAL LOW
GFR calc Af Amer: >60
GFR calc non Af Amer: >60
Potassium: 3.7
Sodium: 131 — ABNORMAL LOW
Sodium: 132 — ABNORMAL LOW
Total Bilirubin: 0.7
Total Bilirubin: 0.9
Total Protein: 5.9 — ABNORMAL LOW
Total Protein: 6.8

## 2011-07-27 LAB — BASIC METABOLIC PANEL
CO2: 25
CO2: 27
CO2: 28
CO2: 22
Calcium: 9.2
Chloride: 103
Chloride: 99
Chloride: 95 — ABNORMAL LOW
Chloride: 96
Creatinine, Ser: 0.76
Creatinine, Ser: 0.93
Creatinine, Ser: 0.96
GFR calc Af Amer: >60
GFR calc Af Amer: >60
GFR calc Af Amer: 37 — ABNORMAL LOW
Glucose, Bld: 120 — ABNORMAL HIGH
Glucose, Bld: 109 — ABNORMAL HIGH
Potassium: 4.5
Potassium: 3.2 — ABNORMAL LOW
Potassium: 3.8
Sodium: 133 — ABNORMAL LOW
Sodium: 132 — ABNORMAL LOW
Sodium: 127 — ABNORMAL LOW

## 2011-07-27 LAB — I-STAT 8, (EC8 V) (CONVERTED LAB)
Acid-base deficit: 4 — ABNORMAL HIGH
Chloride: 102
HCT: 44
Operator id: 270111
Potassium: 4.1
TCO2: 20
pCO2, Ven: 29.9 — ABNORMAL LOW

## 2011-07-27 LAB — POCT I-STAT CREATININE
Creatinine, Ser: 1.3
Operator id: 270111

## 2011-07-27 LAB — BLOOD GAS, ARTERIAL
Acid-base deficit: 0.2
Bicarbonate: 24.2 — ABNORMAL HIGH
TCO2: 21.6
pCO2 arterial: 42.1
pO2, Arterial: 66.6 — ABNORMAL LOW

## 2011-07-27 LAB — RENAL FUNCTION PANEL
Albumin: 3.3 — ABNORMAL LOW
Calcium: 8.5
Phosphorus: 4.1
Potassium: 3 — ABNORMAL LOW
Sodium: 124 — ABNORMAL LOW

## 2011-07-27 LAB — TROPONIN I
Troponin I: 0.03
Troponin I: 0.05

## 2011-07-27 LAB — CK TOTAL AND CKMB (NOT AT ARMC)
CK, MB: 1
CK, MB: 1.2
Relative Index: INVALID
Total CK: 47

## 2011-07-27 LAB — LACTIC ACID, PLASMA
Lactic Acid, Venous: 1.5
Lactic Acid, Venous: 2.8 — ABNORMAL HIGH
Lactic Acid, Venous: 4.2 — ABNORMAL HIGH

## 2011-07-27 LAB — URINALYSIS, ROUTINE W REFLEX MICROSCOPIC
Bilirubin Urine: NEGATIVE
Glucose, UA: NEGATIVE
Ketones, ur: NEGATIVE
Protein, ur: NEGATIVE

## 2011-07-27 LAB — HEMOGLOBIN A1C: Hgb A1c MFr Bld: 6.2 — ABNORMAL HIGH

## 2011-07-27 LAB — ACETAMINOPHEN LEVEL: Acetaminophen (Tylenol), Serum: <10 — ABNORMAL LOW

## 2011-07-27 LAB — RAPID URINE DRUG SCREEN, HOSP PERFORMED
Benzodiazepines: POSITIVE — AB
Tetrahydrocannabinol: NOT DETECTED

## 2011-07-27 LAB — B-NATRIURETIC PEPTIDE (CONVERTED LAB): Pro B Natriuretic peptide (BNP): 154 — ABNORMAL HIGH

## 2011-07-27 LAB — CARDIAC PANEL(CRET KIN+CKTOT+MB+TROPI): CK, MB: 0.9

## 2011-07-27 LAB — ETHYLENE GLYCOL: Ethylene Glycol Lvl: 18

## 2011-07-28 LAB — BASIC METABOLIC PANEL
BUN: 1 — ABNORMAL LOW
CO2: 22
Chloride: 104
Chloride: 99
GFR calc Af Amer: >60
GFR calc non Af Amer: >60
Glucose, Bld: 135 — ABNORMAL HIGH
Potassium: 3.3 — ABNORMAL LOW
Potassium: 3.5
Sodium: 134 — ABNORMAL LOW
Sodium: 136

## 2011-07-28 LAB — BLOOD GAS, ARTERIAL
Acid-Base Excess: 0.8
Acid-Base Excess: 1.5
Acid-Base Excess: 3.5 — ABNORMAL HIGH
Acid-base deficit: 5.2 — ABNORMAL HIGH
Acid-base deficit: 9.1 — ABNORMAL HIGH
Bicarbonate: 19.5 — ABNORMAL LOW
Bicarbonate: 25.2 — ABNORMAL HIGH
Bicarbonate: 25.2 — ABNORMAL HIGH
FIO2: 21
FIO2: 21
FIO2: 21
FIO2: 21
FIO2: 21
O2 Saturation: 93.1
O2 Saturation: 94
O2 Saturation: 96.2
Patient temperature: 37
Patient temperature: 37
Patient temperature: 37
Patient temperature: 37
Patient temperature: 37
Patient temperature: 37
TCO2: 22.1
TCO2: 22.3
TCO2: 22.8
TCO2: 24
TCO2: 25.2
pCO2 arterial: 30.7 — ABNORMAL LOW
pCO2 arterial: 35.5
pCO2 arterial: 41.6
pCO2 arterial: 42.8
pCO2 arterial: 43.3
pH, Arterial: 7.381
pH, Arterial: 7.401
pH, Arterial: 7.434
pO2, Arterial: 71.1 — ABNORMAL LOW
pO2, Arterial: 71.8 — ABNORMAL LOW
pO2, Arterial: 83.4

## 2011-07-28 LAB — COMPREHENSIVE METABOLIC PANEL
ALT: 25
AST: 23
Calcium: 8.9
GFR calc Af Amer: >60
Glucose, Bld: 123 — ABNORMAL HIGH
Sodium: 130 — ABNORMAL LOW
Total Protein: 6.3

## 2011-07-28 LAB — ETHYLENE GLYCOL
Ethylene Glycol Lvl: 242
Ethylene Glycol Lvl: 41

## 2011-07-28 LAB — CBC
MCHC: 35.7
Platelets: 103 — ABNORMAL LOW
RDW: 13.6

## 2011-07-28 LAB — DIFFERENTIAL
Eosinophils Absolute: 0.3
Lymphs Abs: 2.5
Monocytes Relative: 6
Neutrophils Relative %: 61

## 2011-07-28 LAB — CARDIAC PANEL(CRET KIN+CKTOT+MB+TROPI)
CK, MB: 0.6
Relative Index: INVALID
Total CK: 59
Total CK: 63

## 2011-07-28 LAB — RAPID URINE DRUG SCREEN, HOSP PERFORMED: Barbiturates: NOT DETECTED

## 2011-07-28 LAB — URINALYSIS, ROUTINE W REFLEX MICROSCOPIC
Glucose, UA: NEGATIVE
Nitrite: NEGATIVE
Protein, ur: NEGATIVE
Urobilinogen, UA: 0.2

## 2011-07-28 LAB — OSMOLALITY: Osmolality: 334 — ABNORMAL HIGH

## 2011-12-19 DIAGNOSIS — F17200 Nicotine dependence, unspecified, uncomplicated: Secondary | ICD-10-CM | POA: Diagnosis not present

## 2011-12-19 DIAGNOSIS — M545 Low back pain: Secondary | ICD-10-CM | POA: Diagnosis not present

## 2011-12-19 DIAGNOSIS — E119 Type 2 diabetes mellitus without complications: Secondary | ICD-10-CM | POA: Diagnosis not present

## 2011-12-19 DIAGNOSIS — I10 Essential (primary) hypertension: Secondary | ICD-10-CM | POA: Diagnosis not present

## 2011-12-19 DIAGNOSIS — Z79899 Other long term (current) drug therapy: Secondary | ICD-10-CM | POA: Diagnosis not present

## 2011-12-19 DIAGNOSIS — M5380 Other specified dorsopathies, site unspecified: Secondary | ICD-10-CM | POA: Diagnosis not present

## 2011-12-19 DIAGNOSIS — Z794 Long term (current) use of insulin: Secondary | ICD-10-CM | POA: Diagnosis not present

## 2012-02-21 DIAGNOSIS — L851 Acquired keratosis [keratoderma] palmaris et plantaris: Secondary | ICD-10-CM | POA: Diagnosis not present

## 2012-02-21 DIAGNOSIS — E1149 Type 2 diabetes mellitus with other diabetic neurological complication: Secondary | ICD-10-CM | POA: Diagnosis not present

## 2012-02-21 DIAGNOSIS — E119 Type 2 diabetes mellitus without complications: Secondary | ICD-10-CM | POA: Diagnosis not present

## 2012-02-21 DIAGNOSIS — G5760 Lesion of plantar nerve, unspecified lower limb: Secondary | ICD-10-CM | POA: Diagnosis not present

## 2012-02-23 DIAGNOSIS — Z8249 Family history of ischemic heart disease and other diseases of the circulatory system: Secondary | ICD-10-CM | POA: Diagnosis not present

## 2012-02-23 DIAGNOSIS — I10 Essential (primary) hypertension: Secondary | ICD-10-CM | POA: Diagnosis not present

## 2012-02-23 DIAGNOSIS — E119 Type 2 diabetes mellitus without complications: Secondary | ICD-10-CM | POA: Diagnosis not present

## 2012-02-23 DIAGNOSIS — F411 Generalized anxiety disorder: Secondary | ICD-10-CM | POA: Diagnosis not present

## 2012-02-23 DIAGNOSIS — Z79899 Other long term (current) drug therapy: Secondary | ICD-10-CM | POA: Diagnosis not present

## 2012-02-23 DIAGNOSIS — F17200 Nicotine dependence, unspecified, uncomplicated: Secondary | ICD-10-CM | POA: Diagnosis not present

## 2012-02-23 DIAGNOSIS — Z794 Long term (current) use of insulin: Secondary | ICD-10-CM | POA: Diagnosis not present

## 2012-02-23 DIAGNOSIS — F329 Major depressive disorder, single episode, unspecified: Secondary | ICD-10-CM | POA: Diagnosis not present

## 2012-02-23 DIAGNOSIS — R0789 Other chest pain: Secondary | ICD-10-CM | POA: Diagnosis not present

## 2012-02-23 DIAGNOSIS — R079 Chest pain, unspecified: Secondary | ICD-10-CM | POA: Diagnosis not present

## 2012-02-23 DIAGNOSIS — I252 Old myocardial infarction: Secondary | ICD-10-CM | POA: Diagnosis not present

## 2012-03-01 DIAGNOSIS — E559 Vitamin D deficiency, unspecified: Secondary | ICD-10-CM | POA: Diagnosis not present

## 2012-03-01 DIAGNOSIS — E785 Hyperlipidemia, unspecified: Secondary | ICD-10-CM | POA: Diagnosis not present

## 2012-03-01 DIAGNOSIS — I10 Essential (primary) hypertension: Secondary | ICD-10-CM | POA: Diagnosis not present

## 2012-03-01 DIAGNOSIS — E669 Obesity, unspecified: Secondary | ICD-10-CM | POA: Diagnosis not present

## 2012-03-21 DIAGNOSIS — I10 Essential (primary) hypertension: Secondary | ICD-10-CM | POA: Diagnosis not present

## 2012-03-21 DIAGNOSIS — E669 Obesity, unspecified: Secondary | ICD-10-CM | POA: Diagnosis not present

## 2012-03-21 DIAGNOSIS — E559 Vitamin D deficiency, unspecified: Secondary | ICD-10-CM | POA: Diagnosis not present

## 2012-03-21 DIAGNOSIS — E785 Hyperlipidemia, unspecified: Secondary | ICD-10-CM | POA: Diagnosis not present

## 2012-03-27 DIAGNOSIS — Z794 Long term (current) use of insulin: Secondary | ICD-10-CM | POA: Diagnosis not present

## 2012-03-27 DIAGNOSIS — Z79899 Other long term (current) drug therapy: Secondary | ICD-10-CM | POA: Diagnosis not present

## 2012-03-27 DIAGNOSIS — R109 Unspecified abdominal pain: Secondary | ICD-10-CM | POA: Diagnosis not present

## 2012-03-27 DIAGNOSIS — E119 Type 2 diabetes mellitus without complications: Secondary | ICD-10-CM | POA: Diagnosis not present

## 2012-03-27 DIAGNOSIS — R339 Retention of urine, unspecified: Secondary | ICD-10-CM | POA: Diagnosis not present

## 2012-03-27 DIAGNOSIS — I10 Essential (primary) hypertension: Secondary | ICD-10-CM | POA: Diagnosis not present

## 2012-03-27 DIAGNOSIS — R3911 Hesitancy of micturition: Secondary | ICD-10-CM | POA: Diagnosis not present

## 2012-03-27 DIAGNOSIS — R112 Nausea with vomiting, unspecified: Secondary | ICD-10-CM | POA: Diagnosis not present

## 2012-04-03 DIAGNOSIS — F315 Bipolar disorder, current episode depressed, severe, with psychotic features: Secondary | ICD-10-CM | POA: Diagnosis not present

## 2012-04-03 DIAGNOSIS — J0110 Acute frontal sinusitis, unspecified: Secondary | ICD-10-CM | POA: Diagnosis not present

## 2012-04-03 DIAGNOSIS — M549 Dorsalgia, unspecified: Secondary | ICD-10-CM | POA: Diagnosis not present

## 2012-04-03 DIAGNOSIS — I10 Essential (primary) hypertension: Secondary | ICD-10-CM | POA: Diagnosis not present

## 2012-04-03 DIAGNOSIS — E782 Mixed hyperlipidemia: Secondary | ICD-10-CM | POA: Diagnosis not present

## 2012-04-03 DIAGNOSIS — Z125 Encounter for screening for malignant neoplasm of prostate: Secondary | ICD-10-CM | POA: Diagnosis not present

## 2012-04-03 DIAGNOSIS — K219 Gastro-esophageal reflux disease without esophagitis: Secondary | ICD-10-CM | POA: Diagnosis not present

## 2012-04-03 DIAGNOSIS — N400 Enlarged prostate without lower urinary tract symptoms: Secondary | ICD-10-CM | POA: Diagnosis not present

## 2012-05-01 DIAGNOSIS — L851 Acquired keratosis [keratoderma] palmaris et plantaris: Secondary | ICD-10-CM | POA: Diagnosis not present

## 2012-05-01 DIAGNOSIS — E1149 Type 2 diabetes mellitus with other diabetic neurological complication: Secondary | ICD-10-CM | POA: Diagnosis not present

## 2012-05-01 DIAGNOSIS — E119 Type 2 diabetes mellitus without complications: Secondary | ICD-10-CM | POA: Diagnosis not present

## 2012-05-01 DIAGNOSIS — G5760 Lesion of plantar nerve, unspecified lower limb: Secondary | ICD-10-CM | POA: Diagnosis not present

## 2012-06-16 DIAGNOSIS — F411 Generalized anxiety disorder: Secondary | ICD-10-CM | POA: Diagnosis not present

## 2012-07-14 DIAGNOSIS — M549 Dorsalgia, unspecified: Secondary | ICD-10-CM | POA: Diagnosis not present

## 2012-08-11 DIAGNOSIS — J019 Acute sinusitis, unspecified: Secondary | ICD-10-CM | POA: Diagnosis not present

## 2012-09-08 DIAGNOSIS — R079 Chest pain, unspecified: Secondary | ICD-10-CM | POA: Diagnosis not present

## 2012-09-08 DIAGNOSIS — R6889 Other general symptoms and signs: Secondary | ICD-10-CM | POA: Diagnosis not present

## 2012-09-29 DIAGNOSIS — R3915 Urgency of urination: Secondary | ICD-10-CM | POA: Diagnosis not present

## 2012-11-28 DIAGNOSIS — Z23 Encounter for immunization: Secondary | ICD-10-CM | POA: Diagnosis not present

## 2012-11-28 DIAGNOSIS — Z125 Encounter for screening for malignant neoplasm of prostate: Secondary | ICD-10-CM | POA: Diagnosis not present

## 2012-11-28 DIAGNOSIS — M25519 Pain in unspecified shoulder: Secondary | ICD-10-CM | POA: Diagnosis not present

## 2012-11-28 DIAGNOSIS — Z0000 Encounter for general adult medical examination without abnormal findings: Secondary | ICD-10-CM | POA: Diagnosis not present

## 2012-12-10 DIAGNOSIS — N179 Acute kidney failure, unspecified: Secondary | ICD-10-CM | POA: Diagnosis not present

## 2012-12-10 DIAGNOSIS — E871 Hypo-osmolality and hyponatremia: Secondary | ICD-10-CM | POA: Diagnosis not present

## 2012-12-11 DIAGNOSIS — J811 Chronic pulmonary edema: Secondary | ICD-10-CM | POA: Diagnosis not present

## 2012-12-11 DIAGNOSIS — N19 Unspecified kidney failure: Secondary | ICD-10-CM | POA: Diagnosis not present

## 2012-12-11 DIAGNOSIS — F200 Paranoid schizophrenia: Secondary | ICD-10-CM | POA: Diagnosis not present

## 2012-12-11 DIAGNOSIS — N170 Acute kidney failure with tubular necrosis: Secondary | ICD-10-CM | POA: Diagnosis not present

## 2012-12-11 DIAGNOSIS — N179 Acute kidney failure, unspecified: Secondary | ICD-10-CM | POA: Diagnosis not present

## 2012-12-11 DIAGNOSIS — T528X1A Toxic effect of other organic solvents, accidental (unintentional), initial encounter: Secondary | ICD-10-CM | POA: Diagnosis not present

## 2012-12-14 DIAGNOSIS — J811 Chronic pulmonary edema: Secondary | ICD-10-CM | POA: Diagnosis not present

## 2012-12-14 DIAGNOSIS — N179 Acute kidney failure, unspecified: Secondary | ICD-10-CM | POA: Diagnosis not present

## 2012-12-15 DIAGNOSIS — J811 Chronic pulmonary edema: Secondary | ICD-10-CM | POA: Diagnosis not present

## 2012-12-16 DIAGNOSIS — N179 Acute kidney failure, unspecified: Secondary | ICD-10-CM | POA: Diagnosis not present

## 2012-12-18 DIAGNOSIS — J690 Pneumonitis due to inhalation of food and vomit: Secondary | ICD-10-CM | POA: Diagnosis not present

## 2012-12-18 DIAGNOSIS — F200 Paranoid schizophrenia: Secondary | ICD-10-CM | POA: Diagnosis not present

## 2012-12-21 DIAGNOSIS — R42 Dizziness and giddiness: Secondary | ICD-10-CM | POA: Diagnosis not present

## 2012-12-21 DIAGNOSIS — R5381 Other malaise: Secondary | ICD-10-CM | POA: Diagnosis not present

## 2012-12-21 DIAGNOSIS — E871 Hypo-osmolality and hyponatremia: Secondary | ICD-10-CM | POA: Diagnosis not present

## 2012-12-21 DIAGNOSIS — I252 Old myocardial infarction: Secondary | ICD-10-CM | POA: Diagnosis not present

## 2012-12-21 DIAGNOSIS — J40 Bronchitis, not specified as acute or chronic: Secondary | ICD-10-CM | POA: Diagnosis not present

## 2012-12-21 DIAGNOSIS — I120 Hypertensive chronic kidney disease with stage 5 chronic kidney disease or end stage renal disease: Secondary | ICD-10-CM | POA: Diagnosis not present

## 2012-12-21 DIAGNOSIS — F17200 Nicotine dependence, unspecified, uncomplicated: Secondary | ICD-10-CM | POA: Diagnosis not present

## 2012-12-21 DIAGNOSIS — N186 End stage renal disease: Secondary | ICD-10-CM | POA: Diagnosis not present

## 2012-12-21 DIAGNOSIS — Z992 Dependence on renal dialysis: Secondary | ICD-10-CM | POA: Diagnosis not present

## 2012-12-21 DIAGNOSIS — E119 Type 2 diabetes mellitus without complications: Secondary | ICD-10-CM | POA: Diagnosis not present

## 2012-12-21 DIAGNOSIS — J449 Chronic obstructive pulmonary disease, unspecified: Secondary | ICD-10-CM | POA: Diagnosis not present

## 2012-12-21 DIAGNOSIS — Z79899 Other long term (current) drug therapy: Secondary | ICD-10-CM | POA: Diagnosis not present

## 2012-12-25 DIAGNOSIS — N186 End stage renal disease: Secondary | ICD-10-CM | POA: Diagnosis not present

## 2012-12-28 DIAGNOSIS — N179 Acute kidney failure, unspecified: Secondary | ICD-10-CM | POA: Diagnosis not present

## 2013-01-03 DIAGNOSIS — Z79899 Other long term (current) drug therapy: Secondary | ICD-10-CM | POA: Diagnosis not present

## 2013-01-03 DIAGNOSIS — I129 Hypertensive chronic kidney disease with stage 1 through stage 4 chronic kidney disease, or unspecified chronic kidney disease: Secondary | ICD-10-CM | POA: Diagnosis not present

## 2013-01-03 DIAGNOSIS — N189 Chronic kidney disease, unspecified: Secondary | ICD-10-CM | POA: Diagnosis not present

## 2013-01-12 DIAGNOSIS — M25519 Pain in unspecified shoulder: Secondary | ICD-10-CM | POA: Diagnosis not present

## 2013-01-12 DIAGNOSIS — S4980XA Other specified injuries of shoulder and upper arm, unspecified arm, initial encounter: Secondary | ICD-10-CM | POA: Diagnosis not present

## 2013-01-19 DIAGNOSIS — M25519 Pain in unspecified shoulder: Secondary | ICD-10-CM | POA: Diagnosis not present

## 2013-01-25 DIAGNOSIS — E119 Type 2 diabetes mellitus without complications: Secondary | ICD-10-CM | POA: Diagnosis not present

## 2013-01-25 DIAGNOSIS — G8929 Other chronic pain: Secondary | ICD-10-CM | POA: Diagnosis not present

## 2013-01-25 DIAGNOSIS — M25519 Pain in unspecified shoulder: Secondary | ICD-10-CM | POA: Diagnosis not present

## 2013-01-25 DIAGNOSIS — M7989 Other specified soft tissue disorders: Secondary | ICD-10-CM | POA: Diagnosis not present

## 2013-01-25 DIAGNOSIS — F209 Schizophrenia, unspecified: Secondary | ICD-10-CM | POA: Diagnosis not present

## 2013-01-25 DIAGNOSIS — N189 Chronic kidney disease, unspecified: Secondary | ICD-10-CM | POA: Diagnosis not present

## 2013-01-25 DIAGNOSIS — R0602 Shortness of breath: Secondary | ICD-10-CM | POA: Diagnosis not present

## 2013-01-25 DIAGNOSIS — R0989 Other specified symptoms and signs involving the circulatory and respiratory systems: Secondary | ICD-10-CM | POA: Diagnosis not present

## 2013-01-25 DIAGNOSIS — K449 Diaphragmatic hernia without obstruction or gangrene: Secondary | ICD-10-CM | POA: Diagnosis not present

## 2013-01-25 DIAGNOSIS — R079 Chest pain, unspecified: Secondary | ICD-10-CM | POA: Diagnosis not present

## 2013-01-25 DIAGNOSIS — M545 Low back pain: Secondary | ICD-10-CM | POA: Diagnosis not present

## 2013-01-25 DIAGNOSIS — I129 Hypertensive chronic kidney disease with stage 1 through stage 4 chronic kidney disease, or unspecified chronic kidney disease: Secondary | ICD-10-CM | POA: Diagnosis not present

## 2013-01-25 DIAGNOSIS — S4980XA Other specified injuries of shoulder and upper arm, unspecified arm, initial encounter: Secondary | ICD-10-CM | POA: Diagnosis not present

## 2013-01-25 DIAGNOSIS — Z888 Allergy status to other drugs, medicaments and biological substances status: Secondary | ICD-10-CM | POA: Diagnosis not present

## 2013-01-25 DIAGNOSIS — F329 Major depressive disorder, single episode, unspecified: Secondary | ICD-10-CM | POA: Diagnosis not present

## 2013-01-25 DIAGNOSIS — N400 Enlarged prostate without lower urinary tract symptoms: Secondary | ICD-10-CM | POA: Diagnosis not present

## 2013-01-25 DIAGNOSIS — F17200 Nicotine dependence, unspecified, uncomplicated: Secondary | ICD-10-CM | POA: Diagnosis not present

## 2013-01-25 DIAGNOSIS — Z79899 Other long term (current) drug therapy: Secondary | ICD-10-CM | POA: Diagnosis not present

## 2013-01-25 DIAGNOSIS — F411 Generalized anxiety disorder: Secondary | ICD-10-CM | POA: Diagnosis not present

## 2013-01-25 DIAGNOSIS — Z2821 Immunization not carried out because of patient refusal: Secondary | ICD-10-CM | POA: Diagnosis not present

## 2013-01-26 DIAGNOSIS — F411 Generalized anxiety disorder: Secondary | ICD-10-CM | POA: Diagnosis not present

## 2013-01-26 DIAGNOSIS — N189 Chronic kidney disease, unspecified: Secondary | ICD-10-CM | POA: Diagnosis not present

## 2013-01-26 DIAGNOSIS — R0989 Other specified symptoms and signs involving the circulatory and respiratory systems: Secondary | ICD-10-CM | POA: Diagnosis not present

## 2013-01-26 DIAGNOSIS — N400 Enlarged prostate without lower urinary tract symptoms: Secondary | ICD-10-CM | POA: Diagnosis not present

## 2013-01-26 DIAGNOSIS — S4980XA Other specified injuries of shoulder and upper arm, unspecified arm, initial encounter: Secondary | ICD-10-CM | POA: Diagnosis not present

## 2013-01-26 DIAGNOSIS — M25519 Pain in unspecified shoulder: Secondary | ICD-10-CM | POA: Diagnosis not present

## 2013-01-26 DIAGNOSIS — I129 Hypertensive chronic kidney disease with stage 1 through stage 4 chronic kidney disease, or unspecified chronic kidney disease: Secondary | ICD-10-CM | POA: Diagnosis not present

## 2013-01-26 DIAGNOSIS — R079 Chest pain, unspecified: Secondary | ICD-10-CM | POA: Diagnosis not present

## 2013-02-11 DIAGNOSIS — Z9115 Patient's noncompliance with renal dialysis: Secondary | ICD-10-CM | POA: Diagnosis not present

## 2013-02-11 DIAGNOSIS — Z9119 Patient's noncompliance with other medical treatment and regimen: Secondary | ICD-10-CM | POA: Diagnosis not present

## 2013-02-11 DIAGNOSIS — R7309 Other abnormal glucose: Secondary | ICD-10-CM | POA: Diagnosis not present

## 2013-02-11 DIAGNOSIS — R609 Edema, unspecified: Secondary | ICD-10-CM | POA: Diagnosis not present

## 2013-02-11 DIAGNOSIS — F17200 Nicotine dependence, unspecified, uncomplicated: Secondary | ICD-10-CM | POA: Diagnosis not present

## 2013-02-11 DIAGNOSIS — Z79899 Other long term (current) drug therapy: Secondary | ICD-10-CM | POA: Diagnosis not present

## 2013-02-11 DIAGNOSIS — N189 Chronic kidney disease, unspecified: Secondary | ICD-10-CM | POA: Diagnosis not present

## 2013-02-11 DIAGNOSIS — R7301 Impaired fasting glucose: Secondary | ICD-10-CM | POA: Diagnosis not present

## 2013-02-11 DIAGNOSIS — J449 Chronic obstructive pulmonary disease, unspecified: Secondary | ICD-10-CM | POA: Diagnosis not present

## 2013-02-11 DIAGNOSIS — I252 Old myocardial infarction: Secondary | ICD-10-CM | POA: Diagnosis not present

## 2013-02-11 DIAGNOSIS — I129 Hypertensive chronic kidney disease with stage 1 through stage 4 chronic kidney disease, or unspecified chronic kidney disease: Secondary | ICD-10-CM | POA: Diagnosis not present

## 2013-02-11 DIAGNOSIS — E119 Type 2 diabetes mellitus without complications: Secondary | ICD-10-CM | POA: Diagnosis not present

## 2013-02-18 DIAGNOSIS — M25473 Effusion, unspecified ankle: Secondary | ICD-10-CM | POA: Diagnosis not present

## 2013-02-18 DIAGNOSIS — R7309 Other abnormal glucose: Secondary | ICD-10-CM | POA: Diagnosis not present

## 2013-02-18 DIAGNOSIS — E119 Type 2 diabetes mellitus without complications: Secondary | ICD-10-CM | POA: Diagnosis not present

## 2013-02-18 DIAGNOSIS — R7301 Impaired fasting glucose: Secondary | ICD-10-CM | POA: Diagnosis not present

## 2013-02-18 DIAGNOSIS — F17200 Nicotine dependence, unspecified, uncomplicated: Secondary | ICD-10-CM | POA: Diagnosis not present

## 2013-02-18 DIAGNOSIS — I10 Essential (primary) hypertension: Secondary | ICD-10-CM | POA: Diagnosis not present

## 2013-02-18 DIAGNOSIS — Z79899 Other long term (current) drug therapy: Secondary | ICD-10-CM | POA: Diagnosis not present

## 2013-02-18 DIAGNOSIS — J449 Chronic obstructive pulmonary disease, unspecified: Secondary | ICD-10-CM | POA: Diagnosis not present

## 2013-02-20 DIAGNOSIS — I509 Heart failure, unspecified: Secondary | ICD-10-CM | POA: Diagnosis not present

## 2013-02-20 DIAGNOSIS — N179 Acute kidney failure, unspecified: Secondary | ICD-10-CM | POA: Diagnosis not present

## 2013-02-20 DIAGNOSIS — E1129 Type 2 diabetes mellitus with other diabetic kidney complication: Secondary | ICD-10-CM | POA: Diagnosis not present

## 2013-02-20 DIAGNOSIS — E1165 Type 2 diabetes mellitus with hyperglycemia: Secondary | ICD-10-CM | POA: Diagnosis not present

## 2013-02-20 DIAGNOSIS — I10 Essential (primary) hypertension: Secondary | ICD-10-CM | POA: Diagnosis not present

## 2013-02-22 DIAGNOSIS — I872 Venous insufficiency (chronic) (peripheral): Secondary | ICD-10-CM | POA: Diagnosis not present

## 2013-02-26 DIAGNOSIS — I10 Essential (primary) hypertension: Secondary | ICD-10-CM | POA: Diagnosis not present

## 2013-02-26 DIAGNOSIS — R7309 Other abnormal glucose: Secondary | ICD-10-CM | POA: Diagnosis not present

## 2013-02-26 DIAGNOSIS — E871 Hypo-osmolality and hyponatremia: Secondary | ICD-10-CM | POA: Diagnosis not present

## 2013-02-26 DIAGNOSIS — F17200 Nicotine dependence, unspecified, uncomplicated: Secondary | ICD-10-CM | POA: Diagnosis not present

## 2013-02-26 DIAGNOSIS — J42 Unspecified chronic bronchitis: Secondary | ICD-10-CM | POA: Diagnosis not present

## 2013-02-26 DIAGNOSIS — R7301 Impaired fasting glucose: Secondary | ICD-10-CM | POA: Diagnosis not present

## 2013-02-26 DIAGNOSIS — J449 Chronic obstructive pulmonary disease, unspecified: Secondary | ICD-10-CM | POA: Diagnosis not present

## 2013-02-26 DIAGNOSIS — Z79899 Other long term (current) drug therapy: Secondary | ICD-10-CM | POA: Diagnosis not present

## 2013-02-26 DIAGNOSIS — M7989 Other specified soft tissue disorders: Secondary | ICD-10-CM | POA: Diagnosis not present

## 2013-02-26 DIAGNOSIS — E119 Type 2 diabetes mellitus without complications: Secondary | ICD-10-CM | POA: Diagnosis not present

## 2013-03-21 DIAGNOSIS — M67919 Unspecified disorder of synovium and tendon, unspecified shoulder: Secondary | ICD-10-CM | POA: Diagnosis not present

## 2013-03-21 DIAGNOSIS — F17200 Nicotine dependence, unspecified, uncomplicated: Secondary | ICD-10-CM | POA: Diagnosis not present

## 2013-03-21 DIAGNOSIS — J449 Chronic obstructive pulmonary disease, unspecified: Secondary | ICD-10-CM | POA: Diagnosis not present

## 2013-03-21 DIAGNOSIS — E119 Type 2 diabetes mellitus without complications: Secondary | ICD-10-CM | POA: Diagnosis not present

## 2013-03-21 DIAGNOSIS — M719 Bursopathy, unspecified: Secondary | ICD-10-CM | POA: Diagnosis not present

## 2013-03-21 DIAGNOSIS — I10 Essential (primary) hypertension: Secondary | ICD-10-CM | POA: Diagnosis not present

## 2013-03-21 DIAGNOSIS — Z79899 Other long term (current) drug therapy: Secondary | ICD-10-CM | POA: Diagnosis not present

## 2013-04-27 DIAGNOSIS — R609 Edema, unspecified: Secondary | ICD-10-CM | POA: Diagnosis not present

## 2013-04-27 DIAGNOSIS — I10 Essential (primary) hypertension: Secondary | ICD-10-CM | POA: Diagnosis not present

## 2013-05-01 DIAGNOSIS — F2089 Other schizophrenia: Secondary | ICD-10-CM | POA: Diagnosis not present

## 2013-05-01 DIAGNOSIS — T50901A Poisoning by unspecified drugs, medicaments and biological substances, accidental (unintentional), initial encounter: Secondary | ICD-10-CM | POA: Diagnosis not present

## 2013-05-01 DIAGNOSIS — T424X4A Poisoning by benzodiazepines, undetermined, initial encounter: Secondary | ICD-10-CM | POA: Diagnosis not present

## 2013-05-01 DIAGNOSIS — E871 Hypo-osmolality and hyponatremia: Secondary | ICD-10-CM

## 2013-05-01 DIAGNOSIS — N400 Enlarged prostate without lower urinary tract symptoms: Secondary | ICD-10-CM | POA: Diagnosis not present

## 2013-05-01 DIAGNOSIS — R079 Chest pain, unspecified: Secondary | ICD-10-CM | POA: Diagnosis not present

## 2013-05-01 DIAGNOSIS — T424X1A Poisoning by benzodiazepines, accidental (unintentional), initial encounter: Secondary | ICD-10-CM | POA: Diagnosis not present

## 2013-05-02 DIAGNOSIS — T424X4A Poisoning by benzodiazepines, undetermined, initial encounter: Secondary | ICD-10-CM | POA: Diagnosis not present

## 2013-05-02 DIAGNOSIS — F2089 Other schizophrenia: Secondary | ICD-10-CM | POA: Diagnosis not present

## 2013-05-02 DIAGNOSIS — T424X1A Poisoning by benzodiazepines, accidental (unintentional), initial encounter: Secondary | ICD-10-CM | POA: Diagnosis not present

## 2013-05-02 DIAGNOSIS — E871 Hypo-osmolality and hyponatremia: Secondary | ICD-10-CM | POA: Diagnosis not present

## 2013-05-02 DIAGNOSIS — N400 Enlarged prostate without lower urinary tract symptoms: Secondary | ICD-10-CM | POA: Diagnosis not present

## 2013-05-10 DIAGNOSIS — I10 Essential (primary) hypertension: Secondary | ICD-10-CM | POA: Diagnosis not present

## 2013-05-10 DIAGNOSIS — F411 Generalized anxiety disorder: Secondary | ICD-10-CM | POA: Diagnosis not present

## 2013-05-10 DIAGNOSIS — Z79899 Other long term (current) drug therapy: Secondary | ICD-10-CM | POA: Diagnosis not present

## 2013-05-10 DIAGNOSIS — M25529 Pain in unspecified elbow: Secondary | ICD-10-CM | POA: Diagnosis not present

## 2013-05-10 DIAGNOSIS — E119 Type 2 diabetes mellitus without complications: Secondary | ICD-10-CM | POA: Diagnosis not present

## 2013-05-10 DIAGNOSIS — R209 Unspecified disturbances of skin sensation: Secondary | ICD-10-CM | POA: Diagnosis not present

## 2013-05-10 DIAGNOSIS — M25519 Pain in unspecified shoulder: Secondary | ICD-10-CM | POA: Diagnosis not present

## 2013-05-10 DIAGNOSIS — F17200 Nicotine dependence, unspecified, uncomplicated: Secondary | ICD-10-CM | POA: Diagnosis not present

## 2013-05-10 DIAGNOSIS — I252 Old myocardial infarction: Secondary | ICD-10-CM | POA: Diagnosis not present

## 2013-05-10 DIAGNOSIS — R079 Chest pain, unspecified: Secondary | ICD-10-CM | POA: Diagnosis not present

## 2013-05-10 DIAGNOSIS — J449 Chronic obstructive pulmonary disease, unspecified: Secondary | ICD-10-CM | POA: Diagnosis not present

## 2013-05-10 DIAGNOSIS — I6789 Other cerebrovascular disease: Secondary | ICD-10-CM | POA: Diagnosis not present

## 2013-05-10 DIAGNOSIS — R609 Edema, unspecified: Secondary | ICD-10-CM | POA: Diagnosis not present

## 2013-05-29 DIAGNOSIS — IMO0001 Reserved for inherently not codable concepts without codable children: Secondary | ICD-10-CM | POA: Diagnosis not present

## 2013-05-29 DIAGNOSIS — I10 Essential (primary) hypertension: Secondary | ICD-10-CM | POA: Diagnosis not present

## 2013-07-05 DIAGNOSIS — M549 Dorsalgia, unspecified: Secondary | ICD-10-CM | POA: Diagnosis not present

## 2013-07-05 DIAGNOSIS — Z6834 Body mass index (BMI) 34.0-34.9, adult: Secondary | ICD-10-CM | POA: Diagnosis not present

## 2013-07-20 DIAGNOSIS — Z79899 Other long term (current) drug therapy: Secondary | ICD-10-CM | POA: Diagnosis not present

## 2013-07-20 DIAGNOSIS — N189 Chronic kidney disease, unspecified: Secondary | ICD-10-CM | POA: Diagnosis not present

## 2013-07-20 DIAGNOSIS — D649 Anemia, unspecified: Secondary | ICD-10-CM | POA: Diagnosis not present

## 2013-07-20 DIAGNOSIS — I129 Hypertensive chronic kidney disease with stage 1 through stage 4 chronic kidney disease, or unspecified chronic kidney disease: Secondary | ICD-10-CM | POA: Diagnosis not present

## 2013-07-24 DIAGNOSIS — E1129 Type 2 diabetes mellitus with other diabetic kidney complication: Secondary | ICD-10-CM | POA: Diagnosis not present

## 2013-07-24 DIAGNOSIS — N179 Acute kidney failure, unspecified: Secondary | ICD-10-CM | POA: Diagnosis not present

## 2013-07-24 DIAGNOSIS — I509 Heart failure, unspecified: Secondary | ICD-10-CM | POA: Diagnosis not present

## 2013-07-24 DIAGNOSIS — I10 Essential (primary) hypertension: Secondary | ICD-10-CM | POA: Diagnosis not present

## 2013-07-31 DIAGNOSIS — R7301 Impaired fasting glucose: Secondary | ICD-10-CM | POA: Diagnosis not present

## 2013-07-31 DIAGNOSIS — R7309 Other abnormal glucose: Secondary | ICD-10-CM | POA: Diagnosis not present

## 2013-08-02 DIAGNOSIS — I10 Essential (primary) hypertension: Secondary | ICD-10-CM | POA: Diagnosis not present

## 2013-08-02 DIAGNOSIS — K449 Diaphragmatic hernia without obstruction or gangrene: Secondary | ICD-10-CM | POA: Diagnosis not present

## 2013-08-02 DIAGNOSIS — R0602 Shortness of breath: Secondary | ICD-10-CM | POA: Diagnosis not present

## 2013-08-02 DIAGNOSIS — R6889 Other general symptoms and signs: Secondary | ICD-10-CM | POA: Diagnosis not present

## 2013-08-02 DIAGNOSIS — R109 Unspecified abdominal pain: Secondary | ICD-10-CM | POA: Diagnosis not present

## 2013-08-02 DIAGNOSIS — N2889 Other specified disorders of kidney and ureter: Secondary | ICD-10-CM | POA: Diagnosis not present

## 2013-08-02 DIAGNOSIS — F209 Schizophrenia, unspecified: Secondary | ICD-10-CM | POA: Diagnosis not present

## 2013-08-02 DIAGNOSIS — N400 Enlarged prostate without lower urinary tract symptoms: Secondary | ICD-10-CM | POA: Diagnosis not present

## 2013-08-02 DIAGNOSIS — E871 Hypo-osmolality and hyponatremia: Secondary | ICD-10-CM | POA: Diagnosis not present

## 2013-08-03 DIAGNOSIS — E871 Hypo-osmolality and hyponatremia: Secondary | ICD-10-CM | POA: Diagnosis not present

## 2013-08-03 DIAGNOSIS — K449 Diaphragmatic hernia without obstruction or gangrene: Secondary | ICD-10-CM | POA: Diagnosis not present

## 2013-08-03 DIAGNOSIS — I10 Essential (primary) hypertension: Secondary | ICD-10-CM | POA: Diagnosis not present

## 2013-08-03 DIAGNOSIS — F209 Schizophrenia, unspecified: Secondary | ICD-10-CM | POA: Diagnosis not present

## 2013-08-03 DIAGNOSIS — N400 Enlarged prostate without lower urinary tract symptoms: Secondary | ICD-10-CM | POA: Diagnosis not present

## 2013-08-10 DIAGNOSIS — M25519 Pain in unspecified shoulder: Secondary | ICD-10-CM | POA: Diagnosis not present

## 2013-08-10 DIAGNOSIS — E782 Mixed hyperlipidemia: Secondary | ICD-10-CM | POA: Diagnosis not present

## 2013-08-10 DIAGNOSIS — I10 Essential (primary) hypertension: Secondary | ICD-10-CM | POA: Diagnosis not present

## 2013-08-10 DIAGNOSIS — E119 Type 2 diabetes mellitus without complications: Secondary | ICD-10-CM | POA: Diagnosis not present

## 2013-10-09 DIAGNOSIS — M549 Dorsalgia, unspecified: Secondary | ICD-10-CM | POA: Diagnosis not present

## 2013-12-21 DIAGNOSIS — E782 Mixed hyperlipidemia: Secondary | ICD-10-CM | POA: Diagnosis not present

## 2013-12-21 DIAGNOSIS — M549 Dorsalgia, unspecified: Secondary | ICD-10-CM | POA: Diagnosis not present

## 2013-12-21 DIAGNOSIS — B372 Candidiasis of skin and nail: Secondary | ICD-10-CM | POA: Diagnosis not present

## 2013-12-21 DIAGNOSIS — G609 Hereditary and idiopathic neuropathy, unspecified: Secondary | ICD-10-CM | POA: Diagnosis not present

## 2013-12-21 DIAGNOSIS — I10 Essential (primary) hypertension: Secondary | ICD-10-CM | POA: Diagnosis not present

## 2013-12-21 DIAGNOSIS — IMO0001 Reserved for inherently not codable concepts without codable children: Secondary | ICD-10-CM | POA: Diagnosis not present

## 2014-01-15 DIAGNOSIS — M25519 Pain in unspecified shoulder: Secondary | ICD-10-CM | POA: Diagnosis not present

## 2014-03-04 DIAGNOSIS — M545 Low back pain, unspecified: Secondary | ICD-10-CM | POA: Diagnosis not present

## 2014-03-04 DIAGNOSIS — M1990 Unspecified osteoarthritis, unspecified site: Secondary | ICD-10-CM | POA: Diagnosis not present

## 2014-03-04 DIAGNOSIS — E1149 Type 2 diabetes mellitus with other diabetic neurological complication: Secondary | ICD-10-CM | POA: Diagnosis not present

## 2014-03-04 DIAGNOSIS — L408 Other psoriasis: Secondary | ICD-10-CM | POA: Diagnosis not present

## 2014-03-14 DIAGNOSIS — M25869 Other specified joint disorders, unspecified knee: Secondary | ICD-10-CM | POA: Diagnosis not present

## 2014-03-14 DIAGNOSIS — M19019 Primary osteoarthritis, unspecified shoulder: Secondary | ICD-10-CM | POA: Diagnosis not present

## 2014-03-14 DIAGNOSIS — M1990 Unspecified osteoarthritis, unspecified site: Secondary | ICD-10-CM | POA: Diagnosis not present

## 2014-03-27 DIAGNOSIS — I252 Old myocardial infarction: Secondary | ICD-10-CM | POA: Diagnosis not present

## 2014-03-27 DIAGNOSIS — R6889 Other general symptoms and signs: Secondary | ICD-10-CM | POA: Diagnosis not present

## 2014-03-27 DIAGNOSIS — Z8249 Family history of ischemic heart disease and other diseases of the circulatory system: Secondary | ICD-10-CM | POA: Diagnosis not present

## 2014-03-27 DIAGNOSIS — Z79899 Other long term (current) drug therapy: Secondary | ICD-10-CM | POA: Diagnosis not present

## 2014-03-27 DIAGNOSIS — G8929 Other chronic pain: Secondary | ICD-10-CM | POA: Diagnosis not present

## 2014-03-27 DIAGNOSIS — F3289 Other specified depressive episodes: Secondary | ICD-10-CM | POA: Diagnosis not present

## 2014-03-27 DIAGNOSIS — Z8659 Personal history of other mental and behavioral disorders: Secondary | ICD-10-CM | POA: Diagnosis not present

## 2014-03-27 DIAGNOSIS — J449 Chronic obstructive pulmonary disease, unspecified: Secondary | ICD-10-CM | POA: Diagnosis not present

## 2014-03-27 DIAGNOSIS — E119 Type 2 diabetes mellitus without complications: Secondary | ICD-10-CM | POA: Diagnosis not present

## 2014-03-27 DIAGNOSIS — F411 Generalized anxiety disorder: Secondary | ICD-10-CM | POA: Diagnosis not present

## 2014-03-27 DIAGNOSIS — I10 Essential (primary) hypertension: Secondary | ICD-10-CM | POA: Diagnosis not present

## 2014-03-27 DIAGNOSIS — R52 Pain, unspecified: Secondary | ICD-10-CM | POA: Diagnosis not present

## 2014-03-27 DIAGNOSIS — F329 Major depressive disorder, single episode, unspecified: Secondary | ICD-10-CM | POA: Diagnosis not present

## 2014-03-29 DIAGNOSIS — I252 Old myocardial infarction: Secondary | ICD-10-CM | POA: Diagnosis not present

## 2014-03-29 DIAGNOSIS — M25519 Pain in unspecified shoulder: Secondary | ICD-10-CM | POA: Diagnosis not present

## 2014-03-29 DIAGNOSIS — F3289 Other specified depressive episodes: Secondary | ICD-10-CM | POA: Diagnosis not present

## 2014-03-29 DIAGNOSIS — G589 Mononeuropathy, unspecified: Secondary | ICD-10-CM | POA: Diagnosis not present

## 2014-03-29 DIAGNOSIS — I10 Essential (primary) hypertension: Secondary | ICD-10-CM | POA: Diagnosis not present

## 2014-03-29 DIAGNOSIS — E7800 Pure hypercholesterolemia, unspecified: Secondary | ICD-10-CM | POA: Diagnosis not present

## 2014-03-29 DIAGNOSIS — Z79899 Other long term (current) drug therapy: Secondary | ICD-10-CM | POA: Diagnosis not present

## 2014-03-29 DIAGNOSIS — J449 Chronic obstructive pulmonary disease, unspecified: Secondary | ICD-10-CM | POA: Diagnosis not present

## 2014-03-29 DIAGNOSIS — R42 Dizziness and giddiness: Secondary | ICD-10-CM | POA: Diagnosis not present

## 2014-03-29 DIAGNOSIS — R631 Polydipsia: Secondary | ICD-10-CM | POA: Diagnosis not present

## 2014-03-29 DIAGNOSIS — K449 Diaphragmatic hernia without obstruction or gangrene: Secondary | ICD-10-CM | POA: Diagnosis not present

## 2014-03-29 DIAGNOSIS — F17200 Nicotine dependence, unspecified, uncomplicated: Secondary | ICD-10-CM | POA: Diagnosis not present

## 2014-03-29 DIAGNOSIS — E871 Hypo-osmolality and hyponatremia: Secondary | ICD-10-CM | POA: Diagnosis not present

## 2014-03-29 DIAGNOSIS — F329 Major depressive disorder, single episode, unspecified: Secondary | ICD-10-CM | POA: Diagnosis not present

## 2014-03-29 DIAGNOSIS — Z794 Long term (current) use of insulin: Secondary | ICD-10-CM | POA: Diagnosis not present

## 2014-03-29 DIAGNOSIS — E119 Type 2 diabetes mellitus without complications: Secondary | ICD-10-CM | POA: Diagnosis not present

## 2014-03-29 DIAGNOSIS — J310 Chronic rhinitis: Secondary | ICD-10-CM | POA: Diagnosis not present

## 2014-03-29 DIAGNOSIS — Z888 Allergy status to other drugs, medicaments and biological substances status: Secondary | ICD-10-CM | POA: Diagnosis not present

## 2014-03-29 DIAGNOSIS — F411 Generalized anxiety disorder: Secondary | ICD-10-CM | POA: Diagnosis not present

## 2014-03-29 DIAGNOSIS — F209 Schizophrenia, unspecified: Secondary | ICD-10-CM | POA: Diagnosis not present

## 2014-03-29 DIAGNOSIS — R109 Unspecified abdominal pain: Secondary | ICD-10-CM | POA: Diagnosis not present

## 2014-03-30 DIAGNOSIS — I10 Essential (primary) hypertension: Secondary | ICD-10-CM | POA: Diagnosis not present

## 2014-03-30 DIAGNOSIS — J310 Chronic rhinitis: Secondary | ICD-10-CM | POA: Diagnosis not present

## 2014-03-30 DIAGNOSIS — E871 Hypo-osmolality and hyponatremia: Secondary | ICD-10-CM | POA: Diagnosis not present

## 2014-03-30 DIAGNOSIS — G589 Mononeuropathy, unspecified: Secondary | ICD-10-CM | POA: Diagnosis not present

## 2014-03-30 DIAGNOSIS — R631 Polydipsia: Secondary | ICD-10-CM | POA: Diagnosis not present

## 2014-04-05 DIAGNOSIS — Z136 Encounter for screening for cardiovascular disorders: Secondary | ICD-10-CM | POA: Diagnosis not present

## 2014-04-07 DIAGNOSIS — R6889 Other general symptoms and signs: Secondary | ICD-10-CM | POA: Diagnosis not present

## 2014-04-18 DIAGNOSIS — B369 Superficial mycosis, unspecified: Secondary | ICD-10-CM | POA: Diagnosis not present

## 2014-05-01 DIAGNOSIS — M47812 Spondylosis without myelopathy or radiculopathy, cervical region: Secondary | ICD-10-CM | POA: Diagnosis not present

## 2014-05-01 DIAGNOSIS — M5137 Other intervertebral disc degeneration, lumbosacral region: Secondary | ICD-10-CM | POA: Diagnosis not present

## 2014-05-01 DIAGNOSIS — E1149 Type 2 diabetes mellitus with other diabetic neurological complication: Secondary | ICD-10-CM | POA: Diagnosis not present

## 2014-05-01 DIAGNOSIS — M47817 Spondylosis without myelopathy or radiculopathy, lumbosacral region: Secondary | ICD-10-CM | POA: Diagnosis not present

## 2014-05-16 DIAGNOSIS — Z794 Long term (current) use of insulin: Secondary | ICD-10-CM | POA: Diagnosis not present

## 2014-05-16 DIAGNOSIS — M1990 Unspecified osteoarthritis, unspecified site: Secondary | ICD-10-CM | POA: Diagnosis not present

## 2014-05-16 DIAGNOSIS — M47817 Spondylosis without myelopathy or radiculopathy, lumbosacral region: Secondary | ICD-10-CM | POA: Diagnosis not present

## 2014-05-16 DIAGNOSIS — L408 Other psoriasis: Secondary | ICD-10-CM | POA: Diagnosis not present

## 2014-05-16 DIAGNOSIS — M47812 Spondylosis without myelopathy or radiculopathy, cervical region: Secondary | ICD-10-CM | POA: Diagnosis not present

## 2014-05-16 DIAGNOSIS — F259 Schizoaffective disorder, unspecified: Secondary | ICD-10-CM | POA: Diagnosis not present

## 2014-05-16 DIAGNOSIS — F411 Generalized anxiety disorder: Secondary | ICD-10-CM | POA: Diagnosis not present

## 2014-05-16 DIAGNOSIS — M5137 Other intervertebral disc degeneration, lumbosacral region: Secondary | ICD-10-CM | POA: Diagnosis not present

## 2014-05-16 DIAGNOSIS — F17200 Nicotine dependence, unspecified, uncomplicated: Secondary | ICD-10-CM | POA: Diagnosis not present

## 2014-05-16 DIAGNOSIS — E1149 Type 2 diabetes mellitus with other diabetic neurological complication: Secondary | ICD-10-CM | POA: Diagnosis not present

## 2014-05-16 DIAGNOSIS — E785 Hyperlipidemia, unspecified: Secondary | ICD-10-CM | POA: Diagnosis not present

## 2014-05-16 DIAGNOSIS — G479 Sleep disorder, unspecified: Secondary | ICD-10-CM | POA: Diagnosis not present

## 2014-05-16 DIAGNOSIS — E1142 Type 2 diabetes mellitus with diabetic polyneuropathy: Secondary | ICD-10-CM | POA: Diagnosis not present

## 2014-05-16 DIAGNOSIS — J449 Chronic obstructive pulmonary disease, unspecified: Secondary | ICD-10-CM | POA: Diagnosis not present

## 2014-05-16 DIAGNOSIS — Z888 Allergy status to other drugs, medicaments and biological substances status: Secondary | ICD-10-CM | POA: Diagnosis not present

## 2014-05-16 DIAGNOSIS — E559 Vitamin D deficiency, unspecified: Secondary | ICD-10-CM | POA: Diagnosis not present

## 2014-05-16 DIAGNOSIS — Z79899 Other long term (current) drug therapy: Secondary | ICD-10-CM | POA: Diagnosis not present

## 2014-05-16 DIAGNOSIS — I10 Essential (primary) hypertension: Secondary | ICD-10-CM | POA: Diagnosis not present

## 2014-05-30 DIAGNOSIS — Z888 Allergy status to other drugs, medicaments and biological substances status: Secondary | ICD-10-CM | POA: Diagnosis not present

## 2014-05-30 DIAGNOSIS — Z5181 Encounter for therapeutic drug level monitoring: Secondary | ICD-10-CM | POA: Diagnosis not present

## 2014-05-30 DIAGNOSIS — L259 Unspecified contact dermatitis, unspecified cause: Secondary | ICD-10-CM | POA: Diagnosis not present

## 2014-05-30 DIAGNOSIS — F17200 Nicotine dependence, unspecified, uncomplicated: Secondary | ICD-10-CM | POA: Diagnosis not present

## 2014-05-30 DIAGNOSIS — B372 Candidiasis of skin and nail: Secondary | ICD-10-CM | POA: Diagnosis not present

## 2014-05-30 DIAGNOSIS — E119 Type 2 diabetes mellitus without complications: Secondary | ICD-10-CM | POA: Diagnosis not present

## 2014-05-30 DIAGNOSIS — G8929 Other chronic pain: Secondary | ICD-10-CM | POA: Diagnosis not present

## 2014-05-30 DIAGNOSIS — I10 Essential (primary) hypertension: Secondary | ICD-10-CM | POA: Diagnosis not present

## 2014-05-30 DIAGNOSIS — M545 Low back pain, unspecified: Secondary | ICD-10-CM | POA: Diagnosis not present

## 2014-05-30 DIAGNOSIS — R079 Chest pain, unspecified: Secondary | ICD-10-CM | POA: Diagnosis not present

## 2014-05-30 DIAGNOSIS — Z79899 Other long term (current) drug therapy: Secondary | ICD-10-CM | POA: Diagnosis not present

## 2014-05-30 DIAGNOSIS — F411 Generalized anxiety disorder: Secondary | ICD-10-CM | POA: Diagnosis not present

## 2014-05-30 DIAGNOSIS — Z0000 Encounter for general adult medical examination without abnormal findings: Secondary | ICD-10-CM | POA: Diagnosis not present

## 2014-05-30 DIAGNOSIS — E871 Hypo-osmolality and hyponatremia: Secondary | ICD-10-CM | POA: Diagnosis not present

## 2014-05-30 DIAGNOSIS — F209 Schizophrenia, unspecified: Secondary | ICD-10-CM | POA: Diagnosis not present

## 2014-05-30 DIAGNOSIS — Z794 Long term (current) use of insulin: Secondary | ICD-10-CM | POA: Diagnosis not present

## 2014-05-30 DIAGNOSIS — Z125 Encounter for screening for malignant neoplasm of prostate: Secondary | ICD-10-CM | POA: Diagnosis not present

## 2014-05-31 DIAGNOSIS — E871 Hypo-osmolality and hyponatremia: Secondary | ICD-10-CM | POA: Diagnosis not present

## 2014-05-31 DIAGNOSIS — M545 Low back pain, unspecified: Secondary | ICD-10-CM | POA: Diagnosis not present

## 2014-05-31 DIAGNOSIS — F209 Schizophrenia, unspecified: Secondary | ICD-10-CM | POA: Diagnosis not present

## 2014-05-31 DIAGNOSIS — G8929 Other chronic pain: Secondary | ICD-10-CM | POA: Diagnosis not present

## 2014-05-31 DIAGNOSIS — R079 Chest pain, unspecified: Secondary | ICD-10-CM | POA: Diagnosis not present

## 2014-06-07 DIAGNOSIS — K3184 Gastroparesis: Secondary | ICD-10-CM | POA: Diagnosis not present

## 2014-06-07 DIAGNOSIS — I209 Angina pectoris, unspecified: Secondary | ICD-10-CM | POA: Diagnosis not present

## 2014-07-18 DIAGNOSIS — Z794 Long term (current) use of insulin: Secondary | ICD-10-CM | POA: Diagnosis not present

## 2014-07-18 DIAGNOSIS — E119 Type 2 diabetes mellitus without complications: Secondary | ICD-10-CM | POA: Diagnosis not present

## 2014-09-09 DIAGNOSIS — R351 Nocturia: Secondary | ICD-10-CM | POA: Diagnosis not present

## 2014-09-09 DIAGNOSIS — E1142 Type 2 diabetes mellitus with diabetic polyneuropathy: Secondary | ICD-10-CM | POA: Diagnosis not present

## 2014-09-09 DIAGNOSIS — G894 Chronic pain syndrome: Secondary | ICD-10-CM | POA: Diagnosis not present

## 2014-09-09 DIAGNOSIS — I10 Essential (primary) hypertension: Secondary | ICD-10-CM | POA: Diagnosis not present

## 2014-09-09 DIAGNOSIS — Z720 Tobacco use: Secondary | ICD-10-CM | POA: Diagnosis not present

## 2014-09-09 DIAGNOSIS — J449 Chronic obstructive pulmonary disease, unspecified: Secondary | ICD-10-CM | POA: Diagnosis not present

## 2014-09-09 DIAGNOSIS — E785 Hyperlipidemia, unspecified: Secondary | ICD-10-CM | POA: Diagnosis not present

## 2014-09-25 ENCOUNTER — Ambulatory Visit: Payer: Medicare Other | Admitting: Family Medicine

## 2014-09-25 DIAGNOSIS — J441 Chronic obstructive pulmonary disease with (acute) exacerbation: Secondary | ICD-10-CM | POA: Diagnosis not present

## 2014-09-25 DIAGNOSIS — R0602 Shortness of breath: Secondary | ICD-10-CM | POA: Diagnosis not present

## 2014-09-25 DIAGNOSIS — Z794 Long term (current) use of insulin: Secondary | ICD-10-CM | POA: Diagnosis not present

## 2014-09-25 DIAGNOSIS — R0789 Other chest pain: Secondary | ICD-10-CM | POA: Diagnosis not present

## 2014-09-25 DIAGNOSIS — R05 Cough: Secondary | ICD-10-CM | POA: Diagnosis not present

## 2014-09-25 DIAGNOSIS — I10 Essential (primary) hypertension: Secondary | ICD-10-CM | POA: Diagnosis not present

## 2014-09-25 DIAGNOSIS — F17200 Nicotine dependence, unspecified, uncomplicated: Secondary | ICD-10-CM | POA: Diagnosis not present

## 2014-09-25 DIAGNOSIS — E119 Type 2 diabetes mellitus without complications: Secondary | ICD-10-CM | POA: Diagnosis not present

## 2014-09-25 DIAGNOSIS — R0989 Other specified symptoms and signs involving the circulatory and respiratory systems: Secondary | ICD-10-CM | POA: Diagnosis not present

## 2014-10-10 ENCOUNTER — Ambulatory Visit: Payer: Medicare Other | Admitting: Internal Medicine

## 2014-10-14 ENCOUNTER — Telehealth: Payer: Self-pay | Admitting: Internal Medicine

## 2014-10-14 ENCOUNTER — Ambulatory Visit: Payer: Medicare Other | Attending: Family Medicine | Admitting: Internal Medicine

## 2014-10-14 ENCOUNTER — Encounter: Payer: Self-pay | Admitting: Internal Medicine

## 2014-10-14 VITALS — BP 109/74 | HR 83 | Temp 97.7°F | Resp 16 | Ht 72.0 in | Wt 241.0 lb

## 2014-10-14 DIAGNOSIS — F251 Schizoaffective disorder, depressive type: Secondary | ICD-10-CM | POA: Insufficient documentation

## 2014-10-14 DIAGNOSIS — Z720 Tobacco use: Secondary | ICD-10-CM | POA: Diagnosis not present

## 2014-10-14 DIAGNOSIS — E119 Type 2 diabetes mellitus without complications: Secondary | ICD-10-CM | POA: Diagnosis not present

## 2014-10-14 DIAGNOSIS — E785 Hyperlipidemia, unspecified: Secondary | ICD-10-CM | POA: Insufficient documentation

## 2014-10-14 DIAGNOSIS — J449 Chronic obstructive pulmonary disease, unspecified: Secondary | ICD-10-CM

## 2014-10-14 DIAGNOSIS — R0989 Other specified symptoms and signs involving the circulatory and respiratory systems: Secondary | ICD-10-CM | POA: Insufficient documentation

## 2014-10-14 DIAGNOSIS — F259 Schizoaffective disorder, unspecified: Secondary | ICD-10-CM | POA: Diagnosis not present

## 2014-10-14 DIAGNOSIS — I10 Essential (primary) hypertension: Secondary | ICD-10-CM | POA: Insufficient documentation

## 2014-10-14 DIAGNOSIS — F419 Anxiety disorder, unspecified: Secondary | ICD-10-CM | POA: Diagnosis not present

## 2014-10-14 DIAGNOSIS — J42 Unspecified chronic bronchitis: Secondary | ICD-10-CM | POA: Insufficient documentation

## 2014-10-14 DIAGNOSIS — J441 Chronic obstructive pulmonary disease with (acute) exacerbation: Secondary | ICD-10-CM | POA: Diagnosis not present

## 2014-10-14 DIAGNOSIS — L409 Psoriasis, unspecified: Secondary | ICD-10-CM | POA: Insufficient documentation

## 2014-10-14 DIAGNOSIS — M545 Low back pain: Secondary | ICD-10-CM | POA: Diagnosis not present

## 2014-10-14 DIAGNOSIS — Z0000 Encounter for general adult medical examination without abnormal findings: Secondary | ICD-10-CM | POA: Diagnosis present

## 2014-10-14 DIAGNOSIS — F17210 Nicotine dependence, cigarettes, uncomplicated: Secondary | ICD-10-CM | POA: Diagnosis not present

## 2014-10-14 LAB — POCT GLYCOSYLATED HEMOGLOBIN (HGB A1C): HEMOGLOBIN A1C: 7.9

## 2014-10-14 LAB — GLUCOSE, POCT (MANUAL RESULT ENTRY): POC GLUCOSE: 153 mg/dL — AB (ref 70–99)

## 2014-10-14 MED ORDER — BETAMETHASONE DIPROPIONATE 0.05 % EX OINT: TOPICAL_OINTMENT | Freq: Two times a day (BID) | CUTANEOUS | Status: DC

## 2014-10-14 NOTE — Telephone Encounter (Signed)
Pt just had appt with PCP today and says he did not receive a requested script for decongestant. Please f/u with pt with more info.

## 2014-10-14 NOTE — Progress Notes (Signed)
Pt is here to establish care. Pt has a history of diabetes and HTN. Pt states that for a week he has been having pain in his lower back. Pt reports that for 4 weeks he has had congestion in his head and lungs.
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
## 2014-10-17 ENCOUNTER — Encounter: Payer: Self-pay | Admitting: Family Medicine

## 2014-10-22 ENCOUNTER — Telehealth: Payer: Self-pay | Admitting: Emergency Medicine

## 2014-10-22 NOTE — Telephone Encounter (Signed)
Pt is requesting pain medication and anxiety meds. Please f/u

## 2014-10-22 NOTE — Telephone Encounter (Signed)
Patient will now be under the care of Dr. Adrian Blackwater when he returns for a follow up appointment. He may address it with her at that time.

## 2014-10-23 ENCOUNTER — Telehealth: Payer: Self-pay | Admitting: *Deleted

## 2014-10-23 ENCOUNTER — Telehealth: Payer: Self-pay | Admitting: Family Medicine

## 2014-10-23 NOTE — Telephone Encounter (Signed)
Pt returning nurse's call. Please f/u with pt.  °

## 2014-10-23 NOTE — Telephone Encounter (Signed)
Left voice to return call

## 2014-10-23 NOTE — Telephone Encounter (Signed)
Pt returned call, advised to make appointment with Dr Adrian Blackwater F/U anxiety and pain      Author Note Status Last Update User Last Update Date/Time   Lance Bosch, NP Signed Lance Bosch, NP 10/22/2014 7:00 PM  Telephone Encounter   Expand All Collapse All    Patient will now be under the care of Dr. Adrian Blackwater when he returns for a follow up appointment. He may address it with her at that time.

## 2014-10-29 ENCOUNTER — Telehealth: Payer: Self-pay | Admitting: Family Medicine

## 2014-10-29 NOTE — Telephone Encounter (Signed)
Pt advice to come to walking clinic for evaluation Between 9-11 and 2-4

## 2014-10-29 NOTE — Telephone Encounter (Signed)
Pt. Is also requesting an inhaler, he states that he is short of breath due to his congestion.

## 2014-10-29 NOTE — Telephone Encounter (Signed)
Patient called to request some medication to treat his congestion, patient states that he is not feeling well and would like to speak to a nurse. Please f/u with pt.

## 2014-10-31 ENCOUNTER — Ambulatory Visit: Payer: Medicare Other | Admitting: Family Medicine

## 2014-11-04 ENCOUNTER — Ambulatory Visit: Payer: Medicare Other | Admitting: Family Medicine

## 2014-11-07 ENCOUNTER — Ambulatory Visit (HOSPITAL_COMMUNITY)
Admission: RE | Admit: 2014-11-07 | Discharge: 2014-11-07 | Disposition: A | Discharge status: Home / Self Care | Payer: Medicare Other | Source: Ambulatory Visit | Attending: Family Medicine | Admitting: Family Medicine

## 2014-11-07 ENCOUNTER — Ambulatory Visit: Payer: Medicare Other | Admitting: Family Medicine

## 2014-11-07 ENCOUNTER — Ambulatory Visit (HOSPITAL_BASED_OUTPATIENT_CLINIC_OR_DEPARTMENT_OTHER): Payer: Medicare Other | Admitting: Family Medicine

## 2014-11-07 ENCOUNTER — Encounter: Payer: Self-pay | Admitting: Family Medicine

## 2014-11-07 VITALS — BP 119/75 | HR 82 | Temp 97.6°F | Resp 16 | Ht 72.0 in | Wt 244.0 lb

## 2014-11-07 DIAGNOSIS — B351 Tinea unguium: Secondary | ICD-10-CM

## 2014-11-07 DIAGNOSIS — M545 Low back pain, unspecified: Secondary | ICD-10-CM

## 2014-11-07 DIAGNOSIS — B353 Tinea pedis: Secondary | ICD-10-CM | POA: Diagnosis not present

## 2014-11-07 DIAGNOSIS — E119 Type 2 diabetes mellitus without complications: Secondary | ICD-10-CM | POA: Insufficient documentation

## 2014-11-07 DIAGNOSIS — Z794 Long term (current) use of insulin: Secondary | ICD-10-CM | POA: Insufficient documentation

## 2014-11-07 DIAGNOSIS — G8929 Other chronic pain: Secondary | ICD-10-CM

## 2014-11-07 DIAGNOSIS — R739 Hyperglycemia, unspecified: Secondary | ICD-10-CM | POA: Diagnosis not present

## 2014-11-07 DIAGNOSIS — F259 Schizoaffective disorder, unspecified: Secondary | ICD-10-CM | POA: Diagnosis not present

## 2014-11-07 DIAGNOSIS — I10 Essential (primary) hypertension: Secondary | ICD-10-CM

## 2014-11-07 DIAGNOSIS — R05 Cough: Secondary | ICD-10-CM | POA: Diagnosis not present

## 2014-11-07 DIAGNOSIS — J449 Chronic obstructive pulmonary disease, unspecified: Secondary | ICD-10-CM | POA: Diagnosis not present

## 2014-11-07 DIAGNOSIS — M47816 Spondylosis without myelopathy or radiculopathy, lumbar region: Secondary | ICD-10-CM | POA: Diagnosis not present

## 2014-11-07 LAB — COMPLETE METABOLIC PANEL WITH GFR
ALBUMIN: 4.8 g/dL (ref 3.5–5.2)
ALT: 12 U/L (ref 0–53)
AST: 12 U/L (ref 0–37)
Alkaline Phosphatase: 84 U/L (ref 39–117)
BUN: 15 mg/dL (ref 6–23)
CHLORIDE: 99 meq/L (ref 96–112)
CO2: 26 mEq/L (ref 19–32)
Calcium: 9.8 mg/dL (ref 8.4–10.5)
Creat: 1.38 mg/dL — ABNORMAL HIGH (ref 0.50–1.35)
GFR, EST AFRICAN AMERICAN: 67 mL/min
GFR, EST NON AFRICAN AMERICAN: 58 mL/min — AB
Glucose, Bld: 194 mg/dL — ABNORMAL HIGH (ref 70–99)
POTASSIUM: 5 meq/L (ref 3.5–5.3)
SODIUM: 136 meq/L (ref 135–145)
Total Bilirubin: 0.5 mg/dL (ref 0.2–1.2)
Total Protein: 7.2 g/dL (ref 6.0–8.3)

## 2014-11-07 LAB — LIPID PANEL
CHOL/HDL RATIO: 6.3 ratio
CHOLESTEROL: 190 mg/dL (ref 0–200)
HDL: 30 mg/dL — AB (ref >39)
LDL Cholesterol: 95 mg/dL (ref 0–99)
TRIGLYCERIDES: 325 mg/dL — AB (ref <150)
VLDL: 65 mg/dL — AB (ref 0–40)

## 2014-11-07 LAB — CBC
HEMATOCRIT: 40.8 % (ref 39.0–52.0)
Hemoglobin: 14.5 g/dL (ref 13.0–17.0)
MCH: 31.8 pg (ref 26.0–34.0)
MCHC: 35.5 g/dL (ref 30.0–36.0)
MCV: 89.5 fL (ref 78.0–100.0)
MPV: 9.6 fL (ref 8.6–12.4)
PLATELETS: 162 10*3/uL (ref 150–400)
RBC: 4.56 MIL/uL (ref 4.22–5.81)
RDW: 13.7 % (ref 11.5–15.5)
WBC: 5.9 10*3/uL (ref 4.0–10.5)

## 2014-11-07 LAB — GLUCOSE, POCT (MANUAL RESULT ENTRY)
POC Glucose: 325 mg/dl — AB (ref 70–99)
POC Glucose: 214 mg/dl — AB (ref 70–99)

## 2014-11-07 MED ORDER — FLUTICASONE-SALMETEROL 100-50 MCG/DOSE IN AEPB: 1.0000 | INHALATION_SPRAY | Freq: Two times a day (BID) | RESPIRATORY_TRACT | Status: DC

## 2014-11-07 MED ORDER — CLONAZEPAM 1 MG PO TABS: 1.0000 mg | ORAL_TABLET | Freq: Three times a day (TID) | ORAL | Status: DC

## 2014-11-07 MED ORDER — ESCITALOPRAM OXALATE 20 MG PO TABS: 20.0000 mg | ORAL_TABLET | Freq: Every day | ORAL | Status: DC

## 2014-11-07 MED ORDER — ACETAMINOPHEN-CODEINE #3 300-30 MG PO TABS: 1.0000 | ORAL_TABLET | Freq: Two times a day (BID) | ORAL | Status: DC

## 2014-11-07 MED ORDER — METOPROLOL TARTRATE 25 MG PO TABS: 25.0000 mg | ORAL_TABLET | Freq: Two times a day (BID) | ORAL | Status: DC

## 2014-11-07 MED ORDER — ALBUTEROL SULFATE HFA 108 (90 BASE) MCG/ACT IN AERS: 2.0000 | INHALATION_SPRAY | Freq: Four times a day (QID) | RESPIRATORY_TRACT | Status: DC | PRN

## 2014-11-07 MED ORDER — INSULIN ASPART 100 UNIT/ML ~~LOC~~ SOLN
10.0000 [IU] | Freq: Once | SUBCUTANEOUS | Status: AC
  Administered 2014-11-07: 10 [IU] via SUBCUTANEOUS

## 2014-11-07 MED ORDER — KETOCONAZOLE 2 % EX CREA: 1.0000 "application " | TOPICAL_CREAM | Freq: Two times a day (BID) | CUTANEOUS | Status: DC | PRN

## 2014-11-07 NOTE — Assessment & Plan Note (Signed)
Schizoaffective with anxiety:  lexapro 20 mg daily. Stop zoloft. Continue klonopin 1 mg three times daily

## 2014-11-07 NOTE — Assessment & Plan Note (Signed)
A; chronic tinea pedis  P: podiatry referral. Checking liver function and white count. If normal will start lamisil 250 mg daily for 12 weeks.

## 2014-11-07 NOTE — Assessment & Plan Note (Signed)
  1. COPD: start advair 1 puff twice daily. Continue albuterol as needed   .

## 2014-11-07 NOTE — Progress Notes (Addendum)
Pt comes in for f/u DM uncontrolled,anxiety with medical management States he refused taking Lantus insulin, only taking sliding scale insulin C/o decreased urination with dizziness Pt appears sob,pursed lip breathing; not on home O2 CBG- elevated 325, 10 units given per protocol Abdomen bloated and distended,denies pain,n,v States he resides in assisted living

## 2014-11-07 NOTE — Progress Notes (Signed)
   Subjective:    Patient ID: [name], male    DOB: [birth year], 54 y.o.   MRN: [id number] CC: establish with new PCP, COPD, chronic pain  HPI 54 yo M:  1. COPD: daily cough. Non productive. Has SOB and feels congested. Using albuterol 2x per night. Denies fever. Gets intermittent CP.   2. DM2:  Refusing using lantus at group home. Taking novolog. No low sugars. Has numbness in feet.   3. Chronic low back pain: x at least 5 years. Pain in low back down legs. Pain associated with increase anxiety.  Worse after laying for a long time. No weakness. No fecal or urinary incontinence. Patient is still smoking. Patient requesting pain medicine and increase in clonazepam dose. Reports codeine causes itching. Reports tramadol causes nightmares.   4. Onychomycosis: thick and long toenails, overlapping. Slight pain.   Soc Hx: down from 4 PPD, to 1-2 PPD   Review of Systems As per HPI     Objective:   Physical Exam BP 119/75 mmHg  Pulse 82  Temp(Src) 97.6 F (36.4 C) (Oral)  Resp 16  Ht 6' (1.829 m)  Wt 244 lb (110.678 kg)  BMI 33.09 kg/m2  SpO2 97% General appearance: alert, cooperative and no distress, pursed lip breathing  Lungs: normal WOB, coarse BS both lungs, scattered exp wheezing  Heart: regular rate and rhythm, S1, S2 normal, no murmur, click, rub or gallop Extremities: edema trace   Diabetic foot exam done  Feet: with thick, long toenails. Scaling.   Lab Results  Component Value Date   HGBA1C 7.9 10/14/2014   CBG 325, 10 U of novolog, repeat 214      Assessment & Plan:

## 2014-11-07 NOTE — Assessment & Plan Note (Signed)
  5. HTN: change from labetalol to metoprolol this BB will not affect your lungs.

## 2014-11-07 NOTE — Patient Instructions (Addendum)
[name],  Thank you for coming in today. It was a pleasure meeting you. I look forward to being your primary doctor.   1. COPD: start advair 1 puff twice daily. Continue albuterol as needed    2. Anxiety:  lexapro 20 mg daily. Stop zoloft. Continue klonopin 1 mg three times daily   3. Low back pain: Establish with pain management, keep that appt. Low back x-ray Adding tylenol #3 1 tab twice daily as needed, printed Rx.   4. Long toenail: podiatry referral. Checking liver function and white count. If normal will start lamisil 250 mg daily for 12 weeks.   5. HTN: change from labetalol to metoprolol this BB will not affect your lungs.   6. Diabetes: high sugar today, treated. Please take lantus, you need it.   Blood work: CMP, CBC, lipid panel, cholesterol.   F/u in 6 weeks for anxiety and low back pain    Dr. Adrian Blackwater

## 2014-11-07 NOTE — Assessment & Plan Note (Signed)
4. Long toenail: podiatry referral. Checking liver function and white count. If normal will start lamisil 250 mg daily for 12 weeks.

## 2014-11-07 NOTE — Assessment & Plan Note (Signed)
3. Low back pain: Establish with pain management, keep that appt. Low back x-ray Adding tylenol #3 1 tab twice daily as needed, printed Rx.

## 2014-11-10 MED ORDER — ATORVASTATIN CALCIUM 40 MG PO TABS: 40.0000 mg | ORAL_TABLET | Freq: Every day | ORAL | Status: DC

## 2014-11-10 NOTE — Addendum Note (Signed)
Addended by: Boykin Nearing on: 11/10/2014 07:15 PM   Modules accepted: Orders

## 2014-11-11 ENCOUNTER — Telehealth: Payer: Self-pay | Admitting: Family Medicine

## 2014-11-11 DIAGNOSIS — J449 Chronic obstructive pulmonary disease, unspecified: Secondary | ICD-10-CM

## 2014-11-11 NOTE — Telephone Encounter (Signed)
Patient is calling stating that his new blood pressure is giving him headaches and would like to be changed back to his original blood pressure medication. Please f/u with pt.

## 2014-11-14 ENCOUNTER — Ambulatory Visit: Payer: Medicare Other

## 2014-11-14 ENCOUNTER — Other Ambulatory Visit: Payer: Self-pay | Admitting: Family Medicine

## 2014-11-14 DIAGNOSIS — B353 Tinea pedis: Secondary | ICD-10-CM

## 2014-11-14 MED ORDER — TERBINAFINE HCL 250 MG PO TABS: 250.0000 mg | ORAL_TABLET | Freq: Every day | ORAL | Status: DC

## 2014-11-15 ENCOUNTER — Telehealth: Payer: Self-pay | Admitting: Family Medicine

## 2014-11-15 NOTE — Telephone Encounter (Signed)
Pt has question regarding medication, please f/u with pt.

## 2014-11-18 NOTE — Telephone Encounter (Signed)
Stated want to change blood pressure medication to the one he was taking before Needs Rx  nose spray, feeling stuffy

## 2014-11-19 ENCOUNTER — Other Ambulatory Visit: Payer: Self-pay | Admitting: Family Medicine

## 2014-11-19 ENCOUNTER — Telehealth: Payer: Self-pay | Admitting: *Deleted

## 2014-11-19 DIAGNOSIS — J309 Allergic rhinitis, unspecified: Secondary | ICD-10-CM | POA: Insufficient documentation

## 2014-11-19 MED ORDER — FLUTICASONE PROPIONATE 50 MCG/ACT NA SUSP: 2.0000 | Freq: Every day | NASAL | Status: DC

## 2014-11-19 MED ORDER — DEXTROMETHORPHAN-GUAIFENESIN 10-200 MG PO CAPS: 1.0000 | ORAL_CAPSULE | Freq: Three times a day (TID) | ORAL | Status: DC | PRN

## 2014-11-19 NOTE — Telephone Encounter (Signed)
Patient called requesting medication for nasal congestion.  Dr. Adrian Blackwater made aware and sent prescription for cough syrup and Flonase to patient's pharmacy. Patient also requesting we call his "rests home" and discontinue the Flomax.  Will do.  Kerri Perches

## 2014-11-19 NOTE — Addendum Note (Signed)
Addended by: Boykin Nearing on: 11/19/2014 03:47 PM   Modules accepted: Orders

## 2014-11-19 NOTE — Telephone Encounter (Signed)
Patient called in and spoke to [facility].  1. flonase for nasal congestion  2. D/c flomax patient reports it is not helping.  3. Beta blocker causing HA, improving. Patient willing to continue cardioselective BB.  4. Patient request cough medicine ordered coricidin HBP

## 2014-11-19 NOTE — Assessment & Plan Note (Signed)
A: allergic rhinitis symptoms P: flonase sent to pharmacy

## 2014-11-19 NOTE — Telephone Encounter (Signed)
Spoke with Jaqueline at assisted living informing her that patient's Flomax had been discontinued and that MD had escribed prescription for Flonase and Coricidin HBP for patient's congestion.

## 2014-11-20 ENCOUNTER — Telehealth: Payer: Self-pay | Admitting: *Deleted

## 2014-11-20 NOTE — Telephone Encounter (Signed)
Nursing home for [name] call requesting in writing reason for Flomax was discontinue

## 2014-11-28 ENCOUNTER — Telehealth: Payer: Self-pay | Admitting: Family Medicine

## 2014-11-28 ENCOUNTER — Other Ambulatory Visit: Payer: Self-pay | Admitting: Family Medicine

## 2014-11-28 DIAGNOSIS — G8929 Other chronic pain: Secondary | ICD-10-CM | POA: Diagnosis not present

## 2014-11-28 DIAGNOSIS — M79641 Pain in right hand: Secondary | ICD-10-CM | POA: Diagnosis not present

## 2014-11-28 NOTE — Telephone Encounter (Signed)
Please place in letter that flomax was discontinued due to patient request and reported lack of effectiveness.

## 2014-11-28 NOTE — Telephone Encounter (Signed)
Request d/c order for tylenol #3.  VO given

## 2014-12-06 ENCOUNTER — Telehealth: Payer: Self-pay | Admitting: General Practice

## 2014-12-06 NOTE — Telephone Encounter (Signed)
Patient calling to request an adjustment to his medication: clonazePAM (KLONOPIN) 1 MG tablet. Patient is currently taking 1 mg and is now requesting 2 mg. Patient states he has been having panic attacks more often. Patient states that he is "immune" to the 1 mg. Please follow up with patient.

## 2014-12-06 NOTE — Telephone Encounter (Signed)
Pt stated Klonopin not working  Requesting Rx to increased dose

## 2014-12-09 ENCOUNTER — Ambulatory Visit: Payer: Medicare Other | Admitting: Podiatry

## 2014-12-10 NOTE — Telephone Encounter (Signed)
Please call patient. Please keep klonopin the same Continue lexapro 20 mg daily. Seek mental health services, please give patient the options.

## 2014-12-11 ENCOUNTER — Telehealth: Payer: Self-pay | Admitting: General Practice

## 2014-12-11 NOTE — Telephone Encounter (Signed)
Explain Dr Adrian Blackwater information to pt Pt stated Mental health services did not help him.

## 2014-12-12 ENCOUNTER — Ambulatory Visit: Payer: Medicare Other | Attending: Family Medicine

## 2014-12-12 DIAGNOSIS — M79641 Pain in right hand: Secondary | ICD-10-CM | POA: Diagnosis not present

## 2014-12-12 DIAGNOSIS — G8929 Other chronic pain: Secondary | ICD-10-CM | POA: Diagnosis not present

## 2014-12-27 ENCOUNTER — Telehealth: Payer: Self-pay | Admitting: Family Medicine

## 2014-12-27 NOTE — Telephone Encounter (Signed)
Pt called requesting a letter to be sent to [employer] stating that pt is capable of receiving his own check. Please f/u with pt if pt would have to see PCP to obtain this letter.

## 2015-01-03 NOTE — Telephone Encounter (Signed)
Please call patient. Need more info about who is currently receiving his check and why this is the case? He may need psychiatric eval to determine that he is capable of managing his own finances

## 2015-01-08 DIAGNOSIS — G8929 Other chronic pain: Secondary | ICD-10-CM | POA: Diagnosis not present

## 2015-01-08 DIAGNOSIS — M79641 Pain in right hand: Secondary | ICD-10-CM | POA: Diagnosis not present

## 2015-01-09 ENCOUNTER — Other Ambulatory Visit: Payer: Self-pay | Admitting: Family Medicine

## 2015-01-13 ENCOUNTER — Telehealth: Payer: Self-pay | Admitting: Family Medicine

## 2015-01-13 ENCOUNTER — Telehealth: Payer: Self-pay | Admitting: General Practice

## 2015-01-13 DIAGNOSIS — G8929 Other chronic pain: Secondary | ICD-10-CM

## 2015-01-13 DIAGNOSIS — R600 Localized edema: Secondary | ICD-10-CM

## 2015-01-13 DIAGNOSIS — F259 Schizoaffective disorder, unspecified: Secondary | ICD-10-CM

## 2015-01-13 DIAGNOSIS — E559 Vitamin D deficiency, unspecified: Secondary | ICD-10-CM

## 2015-01-13 DIAGNOSIS — N400 Enlarged prostate without lower urinary tract symptoms: Secondary | ICD-10-CM

## 2015-01-13 DIAGNOSIS — M545 Low back pain, unspecified: Secondary | ICD-10-CM

## 2015-01-13 NOTE — Telephone Encounter (Signed)
Patient nurse called to request clarification on one of the patients medication, patient has a medication that is PRN but she needs to know how many hours in between the medication needs to be taken.

## 2015-01-13 NOTE — Telephone Encounter (Signed)
Patient calling to speak to nurse in regards to the level of care that he has been getting at the pain management clinic that he was referred to. Patient states he is still in a great deal of pain and not receiving pain medications that touches his pain. Patient is requesting a referral to a different pain clinic. Please follow up

## 2015-01-14 ENCOUNTER — Telehealth: Payer: Self-pay | Admitting: Family Medicine

## 2015-01-14 DIAGNOSIS — M545 Low back pain: Principal | ICD-10-CM

## 2015-01-14 DIAGNOSIS — G8929 Other chronic pain: Secondary | ICD-10-CM

## 2015-01-14 NOTE — Telephone Encounter (Signed)
Patient called to speak to nurse about his pain management clinic, patient states that he would like to go to a different facility due to his pain level not being controlled. Please f/u with pt.

## 2015-01-14 NOTE — Telephone Encounter (Signed)
Pt stated do not want to go to pain management clinic he was referred

## 2015-01-15 ENCOUNTER — Telehealth: Payer: Self-pay | Admitting: Family Medicine

## 2015-01-15 NOTE — Telephone Encounter (Signed)
Nurse stated need medication update

## 2015-01-15 NOTE — Telephone Encounter (Signed)
Patient nurse called to request clarification on one of the patients medication, patient has a medication that is PRN but she needs to know how many hours in between the medication needs to be taken. Please f/u with nurse

## 2015-01-16 ENCOUNTER — Other Ambulatory Visit: Payer: Self-pay | Admitting: Family Medicine

## 2015-01-16 DIAGNOSIS — N400 Enlarged prostate without lower urinary tract symptoms: Secondary | ICD-10-CM | POA: Insufficient documentation

## 2015-01-16 DIAGNOSIS — E559 Vitamin D deficiency, unspecified: Secondary | ICD-10-CM | POA: Insufficient documentation

## 2015-01-16 MED ORDER — ACETAMINOPHEN ER 650 MG PO TBCR: 650.0000 mg | EXTENDED_RELEASE_TABLET | Freq: Three times a day (TID) | ORAL | Status: DC

## 2015-01-16 MED ORDER — TAMSULOSIN HCL 0.4 MG PO CAPS: 0.4000 mg | ORAL_CAPSULE | Freq: Every day | ORAL | Status: DC

## 2015-01-16 MED ORDER — HYDROCODONE-ACETAMINOPHEN 5-325 MG PO TABS: 1.0000 | ORAL_TABLET | Freq: Three times a day (TID) | ORAL | Status: DC

## 2015-01-16 NOTE — Addendum Note (Signed)
Addended by: Boykin Nearing on: 01/16/2015 02:04 PM   Modules accepted: Orders, Medications

## 2015-01-16 NOTE — Telephone Encounter (Signed)
Reviewed med list Provided changes. Patient need ECHO to evaluate heart function to determine if 80 mg torsemide still needed.

## 2015-01-16 NOTE — Telephone Encounter (Signed)
Reviewed med list  Provided changes.  Patient need ECHO to evaluate heart function to determine if 80 mg torsemide still needed.  Referral to pain management placed, as I see vicodin was D/Cd on 01/14/15.  Stop ibuprofen Schedule tylenol 650 TID for pain.  Stop date for lamisil 01/30/15 Change amitiza to BID at nursing home D/c simvastatin at [facility] as patient on lipitor D/c vit D at NH, will recheck level at f/u

## 2015-01-16 NOTE — Telephone Encounter (Signed)
Patient may call around and find another pain management clinic if they are accepting new patients with his coverage he can let us know and we will send referral and records.  Until he finds a new clinic he should continue with his current as he cannot

## 2015-01-16 NOTE — Addendum Note (Signed)
Addended by: Boykin Nearing on: 01/16/2015 01:31 PM   Modules accepted: Orders

## 2015-01-16 NOTE — Telephone Encounter (Signed)
Since he is already established, it will be the duty of  Mr. Connon, to  call around and find another pain management clinic if they are accepting new patients with his coverage he can let us know and we will send referral and records.  Until he finds a new clinic he should continue with current clinic.

## 2015-01-17 ENCOUNTER — Telehealth: Payer: Self-pay | Admitting: Family Medicine

## 2015-01-17 NOTE — Telephone Encounter (Signed)
Patient is calling to check on the status on the referral for his pain management. Patient requested to speak to nurse. Please f/u with pt.

## 2015-01-17 NOTE — Telephone Encounter (Signed)
Patient also requested to be sent to Hospice stating " I am tired of the pain".

## 2015-01-20 ENCOUNTER — Other Ambulatory Visit: Payer: Self-pay | Admitting: Family Medicine

## 2015-01-20 NOTE — Telephone Encounter (Signed)
Unable to contact Pt Not accepting phone call at this time

## 2015-01-23 ENCOUNTER — Telehealth: Payer: Self-pay | Admitting: Family Medicine

## 2015-01-23 DIAGNOSIS — Z794 Long term (current) use of insulin: Secondary | ICD-10-CM | POA: Diagnosis not present

## 2015-01-23 DIAGNOSIS — E119 Type 2 diabetes mellitus without complications: Secondary | ICD-10-CM | POA: Diagnosis not present

## 2015-01-23 NOTE — Telephone Encounter (Signed)
Pt. Would like to know how long it will take to receive his appt. To a pain management clinic...pt. States he is in constant pain and can't stand it.....Marland KitchenI informed patient referral was placed on 3/31, referral coordinator would follow up with him with further information

## 2015-01-23 NOTE — Telephone Encounter (Signed)
Pt calling to report that he is in a lot of pain and would like a medication to be prescribed for pain. Please f/u with pt.

## 2015-01-28 ENCOUNTER — Other Ambulatory Visit: Payer: Self-pay | Admitting: Family Medicine

## 2015-01-30 ENCOUNTER — Telehealth: Payer: Self-pay | Admitting: Family Medicine

## 2015-01-30 NOTE — Telephone Encounter (Signed)
Patient called back requesting to speak to nurse, pt states he is having pain and needs pain medication prescribed. Please f/u

## 2015-01-30 NOTE — Telephone Encounter (Signed)
Patient called requesting to speak to nurse regarding medication to be prescribed for pain. Please f/u with pt, he states he needs to be referred to pain management.

## 2015-01-30 NOTE — Telephone Encounter (Signed)
Patient called to speak to nurse about his pain management referral. Please f/u

## 2015-01-31 ENCOUNTER — Telehealth: Payer: Self-pay | Admitting: *Deleted

## 2015-01-31 DIAGNOSIS — E1140 Type 2 diabetes mellitus with diabetic neuropathy, unspecified: Secondary | ICD-10-CM | POA: Diagnosis not present

## 2015-01-31 DIAGNOSIS — R52 Pain, unspecified: Secondary | ICD-10-CM | POA: Diagnosis not present

## 2015-01-31 DIAGNOSIS — Z7982 Long term (current) use of aspirin: Secondary | ICD-10-CM | POA: Diagnosis not present

## 2015-01-31 DIAGNOSIS — F259 Schizoaffective disorder, unspecified: Secondary | ICD-10-CM | POA: Diagnosis not present

## 2015-01-31 DIAGNOSIS — Z794 Long term (current) use of insulin: Secondary | ICD-10-CM | POA: Diagnosis not present

## 2015-01-31 DIAGNOSIS — I10 Essential (primary) hypertension: Secondary | ICD-10-CM | POA: Diagnosis not present

## 2015-01-31 DIAGNOSIS — E784 Other hyperlipidemia: Secondary | ICD-10-CM | POA: Diagnosis not present

## 2015-01-31 DIAGNOSIS — J449 Chronic obstructive pulmonary disease, unspecified: Secondary | ICD-10-CM | POA: Diagnosis not present

## 2015-01-31 DIAGNOSIS — Z79899 Other long term (current) drug therapy: Secondary | ICD-10-CM | POA: Diagnosis not present

## 2015-01-31 DIAGNOSIS — M549 Dorsalgia, unspecified: Secondary | ICD-10-CM | POA: Diagnosis not present

## 2015-01-31 DIAGNOSIS — Z720 Tobacco use: Secondary | ICD-10-CM | POA: Diagnosis not present

## 2015-01-31 DIAGNOSIS — M79606 Pain in leg, unspecified: Secondary | ICD-10-CM | POA: Diagnosis not present

## 2015-01-31 DIAGNOSIS — Z87891 Personal history of nicotine dependence: Secondary | ICD-10-CM | POA: Diagnosis not present

## 2015-01-31 NOTE — Telephone Encounter (Signed)
Patient calling asking status of his pain management referral.  There are no notes in Epic regarding progress made with this.  I have contacted Maren Reamer to ask for her expertise and told the patient I woud call him back as soon as I heard anthing

## 2015-02-03 ENCOUNTER — Telehealth: Payer: Self-pay | Admitting: *Deleted

## 2015-02-03 ENCOUNTER — Telehealth: Payer: Self-pay | Admitting: Family Medicine

## 2015-02-03 NOTE — Telephone Encounter (Signed)
Patient called requesting to speak to nurse, please f/u with patient

## 2015-02-03 NOTE — Telephone Encounter (Signed)
Attempted to call patient on both numbers listed in chart.  His home phone number message says his message box is full and his mobile message says the person is not accepting calls at this time.  If patient calls back let him know that his referral to [facility] is complete and they will be trying to call him to schedule an appointment.  There phone number is [phone]

## 2015-02-03 NOTE — Telephone Encounter (Signed)
Patient called nurse back, please f/u.

## 2015-02-03 NOTE — Telephone Encounter (Signed)
Spoke to patient and let him know that his referral to pain management was complete and they would call him to setup an appointment.  Patient asked the name of the pain clinic and he was told it was [facility].  Patient said he just left them because they were "not treating him right."  He then said he would given them another try.  Patient appreciative.

## 2015-02-05 ENCOUNTER — Telehealth: Payer: Self-pay | Admitting: *Deleted

## 2015-02-05 NOTE — Telephone Encounter (Signed)
Returned Pt call Unable to LVM. Mail box is full

## 2015-02-05 NOTE — Telephone Encounter (Signed)
Patient called in again to ask about his referral to pain management.  He said he had their number and would call them.

## 2015-02-06 ENCOUNTER — Encounter: Payer: Self-pay | Admitting: Family Medicine

## 2015-02-06 ENCOUNTER — Ambulatory Visit: Payer: Medicare Other | Attending: Family Medicine | Admitting: Family Medicine

## 2015-02-06 VITALS — BP 97/68 | HR 87 | Temp 97.9°F | Ht 72.0 in | Wt 243.0 lb

## 2015-02-06 DIAGNOSIS — J449 Chronic obstructive pulmonary disease, unspecified: Secondary | ICD-10-CM

## 2015-02-06 DIAGNOSIS — M545 Low back pain: Secondary | ICD-10-CM | POA: Diagnosis not present

## 2015-02-06 DIAGNOSIS — N400 Enlarged prostate without lower urinary tract symptoms: Secondary | ICD-10-CM

## 2015-02-06 DIAGNOSIS — F1010 Alcohol abuse, uncomplicated: Secondary | ICD-10-CM

## 2015-02-06 DIAGNOSIS — I10 Essential (primary) hypertension: Secondary | ICD-10-CM

## 2015-02-06 DIAGNOSIS — G8929 Other chronic pain: Secondary | ICD-10-CM

## 2015-02-06 DIAGNOSIS — K5900 Constipation, unspecified: Secondary | ICD-10-CM | POA: Insufficient documentation

## 2015-02-06 DIAGNOSIS — K5901 Slow transit constipation: Secondary | ICD-10-CM

## 2015-02-06 DIAGNOSIS — Z0000 Encounter for general adult medical examination without abnormal findings: Secondary | ICD-10-CM

## 2015-02-06 DIAGNOSIS — E119 Type 2 diabetes mellitus without complications: Secondary | ICD-10-CM

## 2015-02-06 LAB — COMPLETE METABOLIC PANEL WITH GFR
ALBUMIN: 4.6 g/dL (ref 3.5–5.2)
ALT: 14 U/L (ref 0–53)
AST: 12 U/L (ref 0–37)
Alkaline Phosphatase: 73 U/L (ref 39–117)
BUN: 15 mg/dL (ref 6–23)
CHLORIDE: 96 meq/L (ref 96–112)
CO2: 21 mEq/L (ref 19–32)
Calcium: 9.3 mg/dL (ref 8.4–10.5)
Creat: 1.19 mg/dL (ref 0.50–1.35)
GFR, Est African American: 80 mL/min
GFR, Est Non African American: 69 mL/min
Glucose, Bld: 116 mg/dL — ABNORMAL HIGH (ref 70–99)
Potassium: 4 mEq/L (ref 3.5–5.3)
Sodium: 130 mEq/L — ABNORMAL LOW (ref 135–145)
Total Bilirubin: 0.5 mg/dL (ref 0.2–1.2)
Total Protein: 6.7 g/dL (ref 6.0–8.3)

## 2015-02-06 LAB — GLUCOSE, POCT (MANUAL RESULT ENTRY): POC GLUCOSE: 133 mg/dL — AB (ref 70–99)

## 2015-02-06 LAB — POCT GLYCOSYLATED HEMOGLOBIN (HGB A1C): HEMOGLOBIN A1C: 7.6

## 2015-02-06 MED ORDER — ACETAMINOPHEN-CODEINE #3 300-30 MG PO TABS: 1.0000 | ORAL_TABLET | ORAL | Status: DC | PRN

## 2015-02-06 MED ORDER — LACTULOSE 10 GM/15ML PO SOLN: 20.0000 g | Freq: Every day | ORAL | Status: DC | PRN

## 2015-02-06 MED ORDER — FINASTERIDE 5 MG PO TABS: 5.0000 mg | ORAL_TABLET | Freq: Every day | ORAL | Status: DC

## 2015-02-06 NOTE — Patient Instructions (Signed)
[name],  Thank you for coming in today.  Diabetes is doing well.   See order sheet for med changes.   F/u in 3 months for diabetes  Dr. Adrian Blackwater

## 2015-02-06 NOTE — Progress Notes (Signed)
Patient here for medication refills.  He complains of chronic pain all over body.  He would like to be tested for cancer.

## 2015-02-07 DIAGNOSIS — I10 Essential (primary) hypertension: Secondary | ICD-10-CM | POA: Diagnosis not present

## 2015-02-07 DIAGNOSIS — M79606 Pain in leg, unspecified: Secondary | ICD-10-CM | POA: Diagnosis not present

## 2015-02-07 DIAGNOSIS — M79642 Pain in left hand: Secondary | ICD-10-CM | POA: Diagnosis not present

## 2015-02-07 DIAGNOSIS — F17200 Nicotine dependence, unspecified, uncomplicated: Secondary | ICD-10-CM | POA: Diagnosis not present

## 2015-02-07 DIAGNOSIS — J449 Chronic obstructive pulmonary disease, unspecified: Secondary | ICD-10-CM | POA: Diagnosis not present

## 2015-02-07 DIAGNOSIS — Z79899 Other long term (current) drug therapy: Secondary | ICD-10-CM | POA: Diagnosis not present

## 2015-02-07 DIAGNOSIS — G8929 Other chronic pain: Secondary | ICD-10-CM | POA: Diagnosis not present

## 2015-02-07 DIAGNOSIS — M79605 Pain in left leg: Secondary | ICD-10-CM | POA: Diagnosis not present

## 2015-02-07 DIAGNOSIS — M549 Dorsalgia, unspecified: Secondary | ICD-10-CM | POA: Diagnosis not present

## 2015-02-07 DIAGNOSIS — M79641 Pain in right hand: Secondary | ICD-10-CM | POA: Diagnosis not present

## 2015-02-07 DIAGNOSIS — M79604 Pain in right leg: Secondary | ICD-10-CM | POA: Diagnosis not present

## 2015-02-07 DIAGNOSIS — E119 Type 2 diabetes mellitus without complications: Secondary | ICD-10-CM | POA: Diagnosis not present

## 2015-02-07 DIAGNOSIS — Z7982 Long term (current) use of aspirin: Secondary | ICD-10-CM | POA: Diagnosis not present

## 2015-02-07 DIAGNOSIS — Z794 Long term (current) use of insulin: Secondary | ICD-10-CM | POA: Diagnosis not present

## 2015-02-10 ENCOUNTER — Telehealth: Payer: Self-pay | Admitting: Family Medicine

## 2015-02-10 ENCOUNTER — Telehealth: Payer: Self-pay | Admitting: General Practice

## 2015-02-10 ENCOUNTER — Encounter: Payer: Self-pay | Admitting: *Deleted

## 2015-02-10 NOTE — Progress Notes (Signed)
   Subjective:    Patient ID: [name], male    DOB: [birth year], 54 y.o.   MRN: [id number] CC: f/u med review from assisted living. Would like to be tested for cancer desires hospice  HPI 54 yo M with schizoaffective disorder:  1. Checked for cancer: patient reports that he would like to be on hospice. He would like to be screened for cancer. He has abdominal distension. He denies ETOH. He has a hx of ETOH abuse. He has COPD denies worsening SOB or cough. He has BP. Not taking flomax bc he reports it makes his retention worse. Reports he had a screening colonscopy with in the last 10 years that was normal. No records available for review.   2. .diabetes: compliant with lantus and novolog. Sugars reportedly well controlled at rest home. No low CBGs. Weight stable.   Soc Hx: current heavy smoker   Review of Systems  Constitutional: Positive for fatigue. Negative for fever, chills, diaphoresis and unexpected weight change.  Respiratory: Positive for shortness of breath.   Cardiovascular: Negative for chest pain and palpitations.  Gastrointestinal: Positive for abdominal distention. Negative for nausea, vomiting, abdominal pain and blood in stool.  Genitourinary: Positive for frequency and difficulty urinating.  Musculoskeletal: Positive for myalgias, back pain and arthralgias.  Psychiatric/Behavioral: The patient is nervous/anxious.        Objective:   Physical Exam BP 97/68 mmHg  Pulse 87  Temp(Src) 97.9 F (36.6 C) (Oral)  Ht 6' (1.829 m)  Wt 243 lb (110.224 kg)  BMI 32.95 kg/m2  SpO2 95%  BP Readings from Last 3 Encounters:  02/06/15 97/68  11/07/14 119/75  10/14/14 109/74    Wt Readings from Last 3 Encounters:  02/06/15 243 lb (110.224 kg)  11/07/14 244 lb (110.678 kg)  10/14/14 241 lb (109.317 kg)   General appearance: alert, cooperative and no distress Lungs: clear to auscultation bilaterally Heart: regular rate and rhythm, S1, S2 normal, no murmur, click, rub or  gallop Extremities: edema trace   Skin: flaky plaques  Rectal: enlarged prostate. FOBT negative. Prostate non tender   Lab Results  Component Value Date   HGBA1C 7.60 02/06/2015   CBG 133     Assessment & Plan:

## 2015-02-10 NOTE — Telephone Encounter (Signed)
Patient's liaison from the rest home has given [place] a call to clarify the medication list that was faxed over; liaison says that updated list will not be accepted because it was not signed; she also has some additional questions regarding the discontinuation of his codeine medication, please f/u @ [phone] or [phone] and ask for [place].

## 2015-02-10 NOTE — Telephone Encounter (Signed)
Patient calling to speak to a nurse. Patient has questions as to why his pain medication was discontinued. Patient states he is in pain & the nurses at the rest home where he resides informed him that they could not provide him any pain medications as they were [place].  Patient request a call back to explain.  Please assist.

## 2015-02-10 NOTE — Assessment & Plan Note (Signed)
A: low BP today. Normal fluid balance Med: compliant P: Continue current reigmen If f/u low decrease demadex or nifedipine dose depending on fluid status

## 2015-02-10 NOTE — Assessment & Plan Note (Signed)
A; COPD and still smoking P: CXR ordered to evaluate for nodule

## 2015-02-10 NOTE — Telephone Encounter (Signed)
[phone] or [phone] and ask for [place]. Left voice for Geni Bers to return call

## 2015-02-10 NOTE — Assessment & Plan Note (Signed)
A: hx of alcohol abuse with abdominal distension P:  Abdominal ultrasound ordered to evaluate for cirrhosis and liver mass

## 2015-02-10 NOTE — Assessment & Plan Note (Signed)
A: well controlled DM2 Med: compliant P: continue current regimen

## 2015-02-10 NOTE — Telephone Encounter (Signed)
Please call back to rest home. Speak first to the nurse then the patient.  His ibuprofen was DCd due to risk of CKD. His narcotic (vicodin) was prescribed by pain management, [facility], and he told me he is not willing to go back. If he is not willing to go back to [facility] for pain management he cannot take vicodin. He must be under the care of pain management for vicodin.  In the meantime I prescibed tylenol #3 which has not been discontinued as I gave him an Rx to turn into his pharmacy at his last OV.

## 2015-02-10 NOTE — Assessment & Plan Note (Signed)
Referral to GI for screening colonoscopy 

## 2015-02-10 NOTE — Assessment & Plan Note (Signed)
A; enlarged prostate patient not compliant with flomax P: Ordered proscar

## 2015-02-12 ENCOUNTER — Ambulatory Visit (HOSPITAL_COMMUNITY): Payer: Medicare Other

## 2015-02-12 ENCOUNTER — Telehealth: Payer: Self-pay | Admitting: Family Medicine

## 2015-02-12 NOTE — Telephone Encounter (Signed)
Patient called requesting results, please f/u with patient  °

## 2015-02-13 ENCOUNTER — Ambulatory Visit: Payer: Medicare Other | Admitting: Podiatry

## 2015-02-14 ENCOUNTER — Other Ambulatory Visit: Payer: Self-pay | Admitting: Family Medicine

## 2015-02-17 ENCOUNTER — Ambulatory Visit (HOSPITAL_COMMUNITY)
Admission: RE | Admit: 2015-02-17 | Discharge: 2015-02-17 | Disposition: A | Discharge status: Home / Self Care | Payer: Medicare Other | Source: Ambulatory Visit | Attending: Family Medicine | Admitting: Family Medicine

## 2015-02-17 DIAGNOSIS — N281 Cyst of kidney, acquired: Secondary | ICD-10-CM | POA: Diagnosis not present

## 2015-02-17 DIAGNOSIS — R1084 Generalized abdominal pain: Secondary | ICD-10-CM | POA: Diagnosis not present

## 2015-02-17 DIAGNOSIS — F1010 Alcohol abuse, uncomplicated: Secondary | ICD-10-CM

## 2015-02-18 ENCOUNTER — Telehealth: Payer: Self-pay | Admitting: Family Medicine

## 2015-02-18 ENCOUNTER — Other Ambulatory Visit: Payer: Self-pay | Admitting: Family Medicine

## 2015-02-18 NOTE — Telephone Encounter (Signed)
Patient called to request a med refill on acetaminophen (TYLENOL 8 HOUR) 650 MG CR tablet. Patient uses [facility] in [place]. Please f/u

## 2015-02-18 NOTE — Telephone Encounter (Signed)
-----   Message from Boykin Nearing, MD sent at 02/17/2015 11:04 AM EDT ----- Ultrasound reveals cyst in R kidney, cyst is small. Does not appear malignant  Otherwise normal ultrasound

## 2015-02-18 NOTE — Telephone Encounter (Signed)
Pt aware of results  Advised to call pharmacy for refills

## 2015-02-18 NOTE — Telephone Encounter (Signed)
Patient would also like his results for his recent tests.

## 2015-02-19 ENCOUNTER — Telehealth: Payer: Self-pay | Admitting: Family Medicine

## 2015-02-19 NOTE — Telephone Encounter (Signed)
Spoke with [facility], requesting verification Sig Rx Tylenol #3 Given verbal sig and Fax updated med list to Fax [phone] Pain Management appointment faxed

## 2015-02-19 NOTE — Telephone Encounter (Signed)
[facility] [facility] called to get clarification on the instructions of Tylenol #3, they faxed over a clarification form that needs to be corrected and faxed back as soon as possible.

## 2015-02-20 ENCOUNTER — Ambulatory Visit (INDEPENDENT_AMBULATORY_CARE_PROVIDER_SITE_OTHER): Payer: Medicare Other | Admitting: Podiatry

## 2015-02-20 ENCOUNTER — Encounter: Payer: Self-pay | Admitting: Podiatry

## 2015-02-20 VITALS — BP 109/57 | HR 69 | Resp 12

## 2015-02-20 DIAGNOSIS — B351 Tinea unguium: Secondary | ICD-10-CM

## 2015-02-20 DIAGNOSIS — E0842 Diabetes mellitus due to underlying condition with diabetic polyneuropathy: Secondary | ICD-10-CM | POA: Diagnosis not present

## 2015-02-20 DIAGNOSIS — M79606 Pain in leg, unspecified: Secondary | ICD-10-CM | POA: Diagnosis not present

## 2015-02-20 NOTE — Progress Notes (Signed)
Subjective:     Patient ID: [name], male   DOB: 1961/05/12, 54 y.o.   MRN: [id number]  HPI long-term diabetic with thick brittle nailbeds 1-5 both feet that are impossible for him to cut and patient is in relatively poor health   Review of Systems  All other systems reviewed and are negative.      Objective:   Physical Exam  Constitutional: He is oriented to person, place, and time.  Cardiovascular: Intact distal pulses.   Musculoskeletal: Normal range of motion.  Neurological: He is oriented to person, place, and time.  Skin: Skin is warm and dry.  Nursing note and vitals reviewed.  neurovascular status is found to be mildly diminished but intact with mild equinus condition and diminished range of motion subtalar midtarsal joint. Patient is noted to have severe thickness of nailbeds 1 through 5 both feet with pain when palpated and inability to wear shoe gear comfortably     Assessment:     Long-term at risk diabetic with diminished neurological vascular status and severe nail disease that are painful 1-5 both feet    Plan:     Reviewed both conditions and at this time deep debridement of nailbeds was accomplished with no iatrogenic bleeding noted. Diabetic education rendered to patient

## 2015-02-20 NOTE — Progress Notes (Signed)
   Subjective:    Patient ID: [name], male    DOB: [birth year], 54 y.o.   MRN: [id number]  HPI  PT REQUESTING FOR TOENAILS DEBRIDEMENT.  Review of Systems  Respiratory: Positive for shortness of breath.   Skin: Positive for color change.  Neurological: Positive for dizziness and numbness.       Objective:   Physical Exam        Assessment & Plan:

## 2015-02-25 ENCOUNTER — Telehealth: Payer: Self-pay | Admitting: Family Medicine

## 2015-02-25 NOTE — Telephone Encounter (Signed)
Please notify patient and RN at his home.  Called patient's pharmacy, he has plenty of refills at [facility] of [facility]. His home will have to call and request the refills.

## 2015-02-25 NOTE — Telephone Encounter (Signed)
Patient called requesting medication refill for clonazePAM (KLONOPIN) 1 MG tablet . Patient would like to speak to nurse regarding medication, please f/u.

## 2015-02-27 ENCOUNTER — Telehealth: Payer: Self-pay | Admitting: Family Medicine

## 2015-02-27 NOTE — Telephone Encounter (Signed)
Pt contacted [facility] in [place] and states that they do not have any refills for clonazePAM (KLONOPIN) 1 MG tablet. Pt also states that needs refill for nasal spray. Please f/u with pt.

## 2015-03-03 DIAGNOSIS — M488X6 Other specified spondylopathies, lumbar region: Secondary | ICD-10-CM | POA: Diagnosis not present

## 2015-03-03 DIAGNOSIS — Z79899 Other long term (current) drug therapy: Secondary | ICD-10-CM | POA: Diagnosis not present

## 2015-03-03 DIAGNOSIS — M545 Low back pain: Secondary | ICD-10-CM | POA: Diagnosis not present

## 2015-03-03 DIAGNOSIS — E669 Obesity, unspecified: Secondary | ICD-10-CM | POA: Diagnosis not present

## 2015-03-03 DIAGNOSIS — G894 Chronic pain syndrome: Secondary | ICD-10-CM | POA: Diagnosis not present

## 2015-03-03 NOTE — Telephone Encounter (Signed)
Message left on VM from [facility] Pharmacist requesting clarification on med Attempted to reach pharmacist, however, pharmacy closed at 5:30 pm

## 2015-03-03 NOTE — Telephone Encounter (Signed)
Pt went to pain management clinic and received script for pain medications but is unable to obtain meds from pharmacy until medications from [facility] are discontinued. Please f/u with pt with more information.

## 2015-03-07 ENCOUNTER — Other Ambulatory Visit: Payer: Self-pay | Admitting: Family Medicine

## 2015-03-07 NOTE — Telephone Encounter (Signed)
Pt called requesting medication refill on acetaminophen-codeine (TYLENOL #3) 300-30 MG per tablet. Please f/u with patient

## 2015-03-09 NOTE — Telephone Encounter (Signed)
Will disregard, patient under pain management contract

## 2015-03-10 ENCOUNTER — Telehealth: Payer: Self-pay | Admitting: Family Medicine

## 2015-03-10 NOTE — Telephone Encounter (Signed)
Pt requesting refill on Tylenol #3. Please f/u with pt.

## 2015-03-12 ENCOUNTER — Encounter: Payer: Self-pay | Admitting: Family Medicine

## 2015-03-12 ENCOUNTER — Other Ambulatory Visit: Payer: Self-pay | Admitting: Family Medicine

## 2015-03-12 DIAGNOSIS — E119 Type 2 diabetes mellitus without complications: Secondary | ICD-10-CM

## 2015-03-12 MED ORDER — GLUCOSE BLOOD VI STRP: 1.0000 | ORAL_STRIP | Freq: Three times a day (TID) | Status: DC

## 2015-03-12 NOTE — Telephone Encounter (Signed)
Pt has refill need to call pharmacy  Pt aware

## 2015-03-21 ENCOUNTER — Telehealth: Payer: Self-pay | Admitting: Family Medicine

## 2015-03-21 NOTE — Telephone Encounter (Signed)
Received a fax telephone-advise-record from patient stating that he is having trouble breathing. Pt is experience SOB and would like a Rx that will open up his lungs. Please follow up with pt with advise.

## 2015-03-23 ENCOUNTER — Encounter: Payer: Self-pay | Admitting: Family Medicine

## 2015-03-24 NOTE — Telephone Encounter (Signed)
Called patient left VM. Advised f/u appt.  Called patient to discuss SOB. ? Taking albuterol, advair, torsemide.  Advised OV for in person evaluation.

## 2015-03-25 ENCOUNTER — Telehealth: Payer: Self-pay | Admitting: Family Medicine

## 2015-03-25 NOTE — Telephone Encounter (Signed)
Please call [name] and clarify the following:  ? Is patient under the care of pain management? If not the medication options are tylenol #3 or tramadol.  Where is patient moving?

## 2015-03-25 NOTE — Telephone Encounter (Signed)
Pt stated moving to [facility] and will like to continue with our care  Advised to call pain management for pain medication

## 2015-03-25 NOTE — Telephone Encounter (Signed)
Patient called requesting medication refill on his pain medication. Patient states pharmacy needs authorization from doctor. Patient states he is moving and wants PCP to be aware of that. Please f/u with patient

## 2015-03-27 ENCOUNTER — Telehealth: Payer: Self-pay | Admitting: Family Medicine

## 2015-03-27 NOTE — Telephone Encounter (Signed)
[facility] is returning phone call from PCP. Please f/u

## 2015-03-27 NOTE — Telephone Encounter (Signed)
[facility] called to inform PCP that per Dr. Andree Elk it is authorized for patient to take his pain medications to his new apartment;

## 2015-03-27 NOTE — Telephone Encounter (Signed)
Pt calling to follow up on Dr Andree Elk earlier call regarding refill on pain medication.  Pt will be moving out of currently living arrangement and physician at center is requiring PCP's permission to prescribe pain medication. Please f/u with pt and Dr Baruch Merl nurse.

## 2015-03-27 NOTE — Telephone Encounter (Signed)
Left VM Called back to verify patient's pain medication regimen.  Asked for a call back.

## 2015-03-27 NOTE — Telephone Encounter (Signed)
Called back. Left VM.  Asked for return call.

## 2015-03-27 NOTE — Telephone Encounter (Signed)
Kesha from [place] at [facility] called requesting PCP to call back

## 2015-03-28 ENCOUNTER — Telehealth: Payer: Self-pay | Admitting: Family Medicine

## 2015-03-28 DIAGNOSIS — J309 Allergic rhinitis, unspecified: Secondary | ICD-10-CM

## 2015-03-28 MED ORDER — CETIRIZINE HCL 10 MG PO TABS: 10.0000 mg | ORAL_TABLET | Freq: Every day | ORAL | Status: DC
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
## 2015-03-28 NOTE — Telephone Encounter (Signed)
Called patient He has his pain medicine He has moved into his new place ACT team checks in daily and gives pain medicine   Patient asked for refill and antihistamine. Changed for Claritin to zyrtec

## 2015-03-28 NOTE — Telephone Encounter (Signed)
Patient called to speak with PCP about his medications being approved in order for him to move out. Please f/u with pt.

## 2015-04-03 ENCOUNTER — Other Ambulatory Visit: Payer: Self-pay | Admitting: Family Medicine

## 2015-04-03 NOTE — Telephone Encounter (Signed)
Pt's ACT Team rep calling to request refill on all of patient's medications.  Pt has moved away from assisted living facility and prescriptions do not transfer to new arrangement. Please f/u with Patrici Ranks, ACT team member who is faxing patient's current med list to discuss medications.  Patrici Ranks would like clarification on patient's current bp medication.

## 2015-04-07 DIAGNOSIS — G894 Chronic pain syndrome: Secondary | ICD-10-CM | POA: Diagnosis not present

## 2015-04-07 DIAGNOSIS — E669 Obesity, unspecified: Secondary | ICD-10-CM | POA: Diagnosis not present

## 2015-04-07 DIAGNOSIS — M545 Low back pain: Secondary | ICD-10-CM | POA: Diagnosis not present

## 2015-04-07 DIAGNOSIS — M488X6 Other specified spondylopathies, lumbar region: Secondary | ICD-10-CM | POA: Diagnosis not present

## 2015-04-07 DIAGNOSIS — Z79899 Other long term (current) drug therapy: Secondary | ICD-10-CM | POA: Diagnosis not present

## 2015-04-08 NOTE — Telephone Encounter (Signed)
Called back to [place] to discuss patient's medications.  Left VM. Reviewed fax of patient's meds. Sent back fax with updated med list.

## 2015-04-14 ENCOUNTER — Telehealth: Payer: Self-pay | Admitting: *Deleted
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
## 2015-04-15 ENCOUNTER — Telehealth: Payer: Self-pay | Admitting: Family Medicine

## 2015-04-15 DIAGNOSIS — J309 Allergic rhinitis, unspecified: Secondary | ICD-10-CM

## 2015-04-15 DIAGNOSIS — J449 Chronic obstructive pulmonary disease, unspecified: Secondary | ICD-10-CM

## 2015-04-15 MED ORDER — FLUTICASONE-SALMETEROL 100-50 MCG/DOSE IN AEPB: 1.0000 | INHALATION_SPRAY | Freq: Two times a day (BID) | RESPIRATORY_TRACT | Status: DC

## 2015-04-15 MED ORDER — FLUTICASONE PROPIONATE 50 MCG/ACT NA SUSP: 2.0000 | Freq: Every day | NASAL | Status: DC

## 2015-04-15 MED ORDER — ALBUTEROL SULFATE HFA 108 (90 BASE) MCG/ACT IN AERS: 2.0000 | INHALATION_SPRAY | Freq: Four times a day (QID) | RESPIRATORY_TRACT | Status: DC | PRN

## 2015-04-15 NOTE — Telephone Encounter (Signed)
Pt requesting refill on inhaler and nasal spray. Please f/u with pt.

## 2015-04-15 NOTE — Telephone Encounter (Signed)
Refill send to [facility]

## 2015-04-16 ENCOUNTER — Telehealth: Payer: Self-pay | Admitting: Family Medicine

## 2015-04-16 DIAGNOSIS — M545 Low back pain: Principal | ICD-10-CM

## 2015-04-16 DIAGNOSIS — G8929 Other chronic pain: Secondary | ICD-10-CM

## 2015-04-16 NOTE — Telephone Encounter (Signed)
Called preferred pain management Left VM voicing patient concerns about his pain regimen, too sedated  Asked for call back

## 2015-04-16 NOTE — Telephone Encounter (Signed)
Patient called requesting home health nurse from [facility] in [place] to help with everyday activities. Please f/u with patient if request is possible.

## 2015-04-17 NOTE — Telephone Encounter (Signed)
Patient called requesting home health nurse from [facility] in [place] to help with everyday activities. Please f/u with patient if request is possible.

## 2015-04-17 NOTE — Telephone Encounter (Signed)
Please call back to patient home health order placed

## 2015-04-18 ENCOUNTER — Telehealth: Payer: Self-pay | Admitting: Family Medicine

## 2015-04-18 NOTE — Telephone Encounter (Signed)
Patient has called in today to request information about his home health nurse; patient was given information requested; [facility] [address] [place], [place] [address] [phone]

## 2015-04-22 NOTE — Telephone Encounter (Signed)
Form was faxed per home health care Pt aware

## 2015-04-23 DIAGNOSIS — M545 Low back pain: Secondary | ICD-10-CM | POA: Diagnosis not present

## 2015-04-23 DIAGNOSIS — M488X6 Other specified spondylopathies, lumbar region: Secondary | ICD-10-CM | POA: Diagnosis not present

## 2015-04-23 DIAGNOSIS — G894 Chronic pain syndrome: Secondary | ICD-10-CM | POA: Diagnosis not present

## 2015-04-23 DIAGNOSIS — M47817 Spondylosis without myelopathy or radiculopathy, lumbosacral region: Secondary | ICD-10-CM | POA: Diagnosis not present

## 2015-04-23 DIAGNOSIS — M79606 Pain in leg, unspecified: Secondary | ICD-10-CM | POA: Diagnosis not present

## 2015-04-23 DIAGNOSIS — E669 Obesity, unspecified: Secondary | ICD-10-CM | POA: Diagnosis not present

## 2015-04-23 DIAGNOSIS — Z79899 Other long term (current) drug therapy: Secondary | ICD-10-CM | POA: Diagnosis not present

## 2015-04-28 DIAGNOSIS — F17210 Nicotine dependence, cigarettes, uncomplicated: Secondary | ICD-10-CM | POA: Diagnosis not present

## 2015-04-28 DIAGNOSIS — F17200 Nicotine dependence, unspecified, uncomplicated: Secondary | ICD-10-CM | POA: Diagnosis not present

## 2015-04-28 DIAGNOSIS — N289 Disorder of kidney and ureter, unspecified: Secondary | ICD-10-CM | POA: Diagnosis not present

## 2015-04-28 DIAGNOSIS — I10 Essential (primary) hypertension: Secondary | ICD-10-CM | POA: Diagnosis not present

## 2015-04-28 DIAGNOSIS — R0789 Other chest pain: Secondary | ICD-10-CM | POA: Diagnosis not present

## 2015-04-28 DIAGNOSIS — R079 Chest pain, unspecified: Secondary | ICD-10-CM | POA: Diagnosis not present

## 2015-04-28 DIAGNOSIS — J449 Chronic obstructive pulmonary disease, unspecified: Secondary | ICD-10-CM | POA: Diagnosis not present

## 2015-04-29 DIAGNOSIS — J449 Chronic obstructive pulmonary disease, unspecified: Secondary | ICD-10-CM | POA: Diagnosis not present

## 2015-04-29 DIAGNOSIS — R079 Chest pain, unspecified: Secondary | ICD-10-CM | POA: Diagnosis not present

## 2015-04-29 DIAGNOSIS — N289 Disorder of kidney and ureter, unspecified: Secondary | ICD-10-CM | POA: Diagnosis not present

## 2015-04-29 DIAGNOSIS — F17200 Nicotine dependence, unspecified, uncomplicated: Secondary | ICD-10-CM | POA: Diagnosis not present

## 2015-04-29 DIAGNOSIS — I10 Essential (primary) hypertension: Secondary | ICD-10-CM | POA: Diagnosis not present

## 2015-04-30 ENCOUNTER — Telehealth: Payer: Self-pay | Admitting: Family Medicine

## 2015-04-30 DIAGNOSIS — L409 Psoriasis, unspecified: Secondary | ICD-10-CM

## 2015-04-30 DIAGNOSIS — J449 Chronic obstructive pulmonary disease, unspecified: Secondary | ICD-10-CM

## 2015-04-30 NOTE — Telephone Encounter (Signed)
Patient called to request a prescription for his Psoriasis, and he would also like Mucinex for his sinuses. Please f/u

## 2015-05-02 ENCOUNTER — Other Ambulatory Visit: Payer: Self-pay | Admitting: Family Medicine

## 2015-05-02 DIAGNOSIS — L409 Psoriasis, unspecified: Secondary | ICD-10-CM

## 2015-05-02 MED ORDER — GUAIFENESIN ER 600 MG PO TB12: 600.0000 mg | ORAL_TABLET | Freq: Two times a day (BID) | ORAL | Status: DC | PRN

## 2015-05-02 MED ORDER — BETAMETHASONE DIPROPIONATE 0.05 % EX OINT: TOPICAL_OINTMENT | Freq: Two times a day (BID) | CUTANEOUS | Status: DC

## 2015-05-02 NOTE — Telephone Encounter (Signed)
Diprolene for psoriasis and mucinex sent in to [facility]

## 2015-05-05 ENCOUNTER — Telehealth: Payer: Self-pay | Admitting: Family Medicine

## 2015-05-05 NOTE — Telephone Encounter (Signed)
Patient called stating that he is in a lot of pain and would like a prescription to treat. Patient stated that he does not want to go back to his pain clinic. He would also like a refill for his Klonopin, he states that his mother is not doing well and his nerves are bad. He would like to personally speak to his PCP. Patient also stated that his right leg is swollen. Please f/u

## 2015-05-06 NOTE — Telephone Encounter (Signed)
Patient called requesting to check status of rx refill, patient would like to know if medication can be refilled. Please f/u with patient

## 2015-05-07 NOTE — Telephone Encounter (Signed)
Pt stated not going to pain management anymore  Advised to request a discharge paper from pain management fax to Dr Adrian Blackwater

## 2015-05-07 NOTE — Telephone Encounter (Signed)
Patient called to request a prescription for his pain, patient would like Tylenol #3 4 times a day, and also something to take in between the pills. Patient stated that he does not wish to go back to the pain clinic because " they made my pain worse, it hurts 24/7". Patient also requested a diarrhetic to take at night because his leg and stomach is swollen. Please f/u with pt.

## 2015-05-12 ENCOUNTER — Telehealth: Payer: Self-pay | Admitting: Family Medicine

## 2015-05-12 DIAGNOSIS — G8929 Other chronic pain: Secondary | ICD-10-CM

## 2015-05-12 DIAGNOSIS — M545 Low back pain: Principal | ICD-10-CM

## 2015-05-12 NOTE — Telephone Encounter (Signed)
Patient called requesting medication for a boil on buttocks, please f/u .

## 2015-05-12 NOTE — Telephone Encounter (Signed)
Patient called requesting medication refill on clonazePAM (KLONOPIN) 1 MG tablet. Please f/u

## 2015-05-15 ENCOUNTER — Telehealth: Payer: Self-pay | Admitting: Family Medicine

## 2015-05-15 MED ORDER — ACETAMINOPHEN-CODEINE #3 300-30 MG PO TABS: 1.0000 | ORAL_TABLET | Freq: Three times a day (TID) | ORAL | Status: DC | PRN

## 2015-05-15 MED ORDER — ACETAMINOPHEN ER 650 MG PO TBCR: 650.0000 mg | EXTENDED_RELEASE_TABLET | Freq: Two times a day (BID) | ORAL | Status: DC

## 2015-05-15 MED ORDER — CLONAZEPAM 1 MG PO TABS: 1.0000 mg | ORAL_TABLET | Freq: Three times a day (TID) | ORAL | Status: DC

## 2015-05-15 NOTE — Telephone Encounter (Signed)
Called in klonopin refill Patient already had plenty of refills   For but boil recommend warm water soak, do not pick at or express the boil  appt will be needed to determine if antibiotics necessary

## 2015-05-15 NOTE — Telephone Encounter (Signed)
Patient called to request a med refill for his Tylenol #3, patient would also like something to take in between taking the Tylenol #3. Patient stated that pain management already faxed a discharge paper stating that he no longer goes to the clinic. Please f/u with pt.

## 2015-05-15 NOTE — Telephone Encounter (Signed)
Tylenol #3 refilled, for three times daily prn  Take regular tylenol twice daily instead of three times a day  Morphine with naltrexone discontinued

## 2015-05-16 NOTE — Telephone Encounter (Signed)
Pt aware Rx ready Stated need to be fax at [facility]

## 2015-05-16 NOTE — Telephone Encounter (Signed)
No. I will not increase klonopin dose.

## 2015-05-16 NOTE — Telephone Encounter (Signed)
Pt aware.

## 2015-05-19 ENCOUNTER — Telehealth: Payer: Self-pay | Admitting: Family Medicine

## 2015-05-19 ENCOUNTER — Telehealth: Payer: Self-pay | Admitting: General Practice

## 2015-05-19 DIAGNOSIS — G8929 Other chronic pain: Secondary | ICD-10-CM

## 2015-05-19 DIAGNOSIS — M545 Low back pain: Principal | ICD-10-CM

## 2015-05-19 NOTE — Telephone Encounter (Signed)
Patient calling to speak to nurse in regards to medication review for Tylenol 3... Patient states the instructions on the bottle states for him to take the medicine 2x a day. Patient states with the amount of pain he is in, he needs to take it 3x a day. Advised patient to follow the medical instructions on the bottle until he speaks to his nurse. Please assist

## 2015-05-19 NOTE — Telephone Encounter (Signed)
Susa Griffins from [facility] called requesting order for [facility] care for state nurse to go to patient's house 2 days a week for aide for daily activities. Please f/u

## 2015-05-20 NOTE — Telephone Encounter (Signed)
Please call back to Diane and give verbal order per Dr. Adrian Blackwater to start home care services twice a week

## 2015-05-20 NOTE — Telephone Encounter (Signed)
Patient called requesting to speak to nurse regarding medication, please f/u

## 2015-05-20 NOTE — Telephone Encounter (Signed)
Pt requesting referral for pain management in [facility]  Per Dr Adrian Blackwater no changes on Tylenol or Klonopin, pt aware

## 2015-05-21 NOTE — Telephone Encounter (Signed)
Unable to contact [name] closed at [employer]

## 2015-05-23 ENCOUNTER — Telehealth: Payer: Self-pay | Admitting: Family Medicine

## 2015-05-23 NOTE — Telephone Encounter (Signed)
Pt is having back and knee pain, wanting a referral for a recliner to help with pain to [facility] off [address] Phone: [phone].  Please follow up with patient. Thank you.

## 2015-05-23 NOTE — Telephone Encounter (Signed)
Please sent that to the provider I only do Specialist referral  Thank you

## 2015-05-26 ENCOUNTER — Other Ambulatory Visit: Payer: Self-pay

## 2015-05-26 ENCOUNTER — Ambulatory Visit (HOSPITAL_COMMUNITY)
Admission: RE | Admit: 2015-05-26 | Discharge: 2015-05-26 | Disposition: A | Discharge status: Home / Self Care | Payer: Medicare Other | Source: Ambulatory Visit | Attending: Cardiology | Admitting: Cardiology

## 2015-05-26 ENCOUNTER — Ambulatory Visit: Payer: Medicare Other | Attending: Family Medicine | Admitting: Family Medicine

## 2015-05-26 ENCOUNTER — Encounter: Payer: Self-pay | Admitting: Family Medicine

## 2015-05-26 VITALS — BP 114/71 | HR 85 | Temp 97.8°F | Resp 16 | Ht 72.0 in | Wt 231.0 lb

## 2015-05-26 DIAGNOSIS — Z794 Long term (current) use of insulin: Secondary | ICD-10-CM | POA: Diagnosis not present

## 2015-05-26 DIAGNOSIS — E119 Type 2 diabetes mellitus without complications: Secondary | ICD-10-CM | POA: Diagnosis not present

## 2015-05-26 DIAGNOSIS — R079 Chest pain, unspecified: Secondary | ICD-10-CM | POA: Diagnosis not present

## 2015-05-26 DIAGNOSIS — F17210 Nicotine dependence, cigarettes, uncomplicated: Secondary | ICD-10-CM | POA: Insufficient documentation

## 2015-05-26 DIAGNOSIS — R600 Localized edema: Secondary | ICD-10-CM

## 2015-05-26 DIAGNOSIS — R609 Edema, unspecified: Secondary | ICD-10-CM | POA: Diagnosis not present

## 2015-05-26 LAB — GLUCOSE, POCT (MANUAL RESULT ENTRY): POC Glucose: 285 mg/dl — AB (ref 70–99)

## 2015-05-26 LAB — POCT GLYCOSYLATED HEMOGLOBIN (HGB A1C): HEMOGLOBIN A1C: 7.4

## 2015-05-26 MED ORDER — ASPIRIN 325 MG PO TABS
325.0000 mg | ORAL_TABLET | Freq: Once | ORAL | Status: AC
  Administered 2015-05-26: 325 mg via ORAL

## 2015-05-26 MED ORDER — ACETAMINOPHEN 500 MG PO TABS
1000.0000 mg | ORAL_TABLET | Freq: Once | ORAL | Status: AC
  Administered 2015-05-26: 1000 mg via ORAL

## 2015-05-26 MED ORDER — INSULIN ASPART 100 UNIT/ML ~~LOC~~ SOLN
10.0000 [IU] | Freq: Once | SUBCUTANEOUS | Status: AC
  Administered 2015-05-26: 10 [IU] via SUBCUTANEOUS

## 2015-05-26 MED ORDER — ONDANSETRON HCL 4 MG PO TABS
8.0000 mg | ORAL_TABLET | Freq: Once | ORAL | Status: AC
  Administered 2015-05-26: 8 mg via ORAL

## 2015-05-26 MED ORDER — NITROGLYCERIN 0.4 MG SL SUBL
0.4000 mg | SUBLINGUAL_TABLET | Freq: Once | SUBLINGUAL | Status: AC
  Administered 2015-05-26: 0.4 mg via SUBLINGUAL

## 2015-05-26 NOTE — Telephone Encounter (Signed)
LVM to Pt to return call   Pt has ECHO appointment on 05/28/2015 at 11:00 arriving 15 min early [facility]

## 2015-05-26 NOTE — Progress Notes (Signed)
   Subjective:    Patient ID: [name], male    DOB: Feb 21, 1961, 54 y.o.   MRN: [id number] CC: pain mamangement referral, chest pain  HPI 54 yo M with hx of schizoaffective disorder and chronic pain presents for f/u:  1. Chest pain: started this AM. L sided pressure and tightness, non-radiating, 8/10. Associated with nausea. Patient is a smoker of 1.5 PPD. Patient has HTN, COPD and diabetes. 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 He was previously in an assisted living facility but now lives at home with regular ACT team check ins.   2. Chronic pain: patient has chronic pain in his back since 2011. He has decided to leave his previous pain management office as he did not feel morphine-naltrexone was working well for him. He has not had tylenol #3 to take. He request a referral to a new pain management facility. He is fully aware that tylenol #3 and tramadol are the only options here.   3. Diabetes: compliant with insulin regimen. No low sugars. Ate french toast w/o syrup at 8 AM.   History  Substance Use Topics  . Smoking status: Heavy Tobacco Smoker -- 1.50 packs/day    Types: Cigarettes  . Smokeless tobacco: Not on file  . Alcohol Use: No   Review of Systems  Constitutional: Negative for fever, chills, fatigue and unexpected weight change.  Eyes: Positive for visual disturbance (blurry vision ).  Respiratory: Positive for cough and chest tightness. Negative for shortness of breath.   Cardiovascular: Positive for chest pain. Negative for palpitations and leg swelling.  Gastrointestinal: Positive for nausea. Negative for vomiting, abdominal pain, diarrhea, constipation and blood in stool.  Endocrine: Negative for polydipsia, polyphagia and polyuria.  Musculoskeletal: Positive for back pain. Negative for myalgias, arthralgias, gait problem and neck pain.  Skin: Positive for rash.  Allergic/Immunologic: Negative for immunocompromised state.  Neurological:  Positive for headaches.  Hematological: Negative for adenopathy. Does not bruise/bleed easily.  Psychiatric/Behavioral: Negative for suicidal ideas, sleep disturbance and dysphoric mood. The patient is not nervous/anxious.       Objective:   Physical Exam  Constitutional: He appears well-developed and well-nourished. No distress.  HENT:  Head: Normocephalic and atraumatic.  Neck: Normal range of motion. Neck supple.  Cardiovascular: Normal rate, regular rhythm, normal heart sounds and intact distal pulses.   Pulmonary/Chest: Effort normal and breath sounds normal.  Musculoskeletal: He exhibits no edema.  Neurological: He is alert.  Skin: Skin is warm and dry. No rash noted. No erythema.  Psychiatric: He has a normal mood and affect.  BP 114/71 mmHg  Pulse 85  Temp(Src) 97.8 F (36.6 C) (Oral)  Resp 16  Ht 6' (1.829 m)  Wt 231 lb (104.781 kg)  BMI 31.32 kg/m2  SpO2 97%  CBG 285  Lab Results  Component Value Date   HGBA1C 7.60 02/06/2015   Lab Results  Component Value Date   HGBA1C 7.40 05/26/2015   EKG: normal EKG, normal sinus rhythm, unchanged from previous tracings.  Treatment: Novolog 10 U Nitroglycerin 0.4 mg ODT Aspirin 325 mg  Zofran 8 mg Tylenol 1000 mg     Assessment & Plan:    EMS called for chest pain r/o NSTEMI

## 2015-05-26 NOTE — Progress Notes (Signed)
Patient being transferred to [facility] to be evaluated for chest pains Spoke with Lenna Sciara in the ED

## 2015-05-26 NOTE — Progress Notes (Signed)
Complaining of chest pain running to lt arm this morning  Sated no changes on medication  Stomach pain, no nausea no diarrhea  Hx tobacco 1 1/2 pper day

## 2015-05-27 ENCOUNTER — Telehealth: Payer: Self-pay | Admitting: *Deleted

## 2015-05-27 DIAGNOSIS — J309 Allergic rhinitis, unspecified: Secondary | ICD-10-CM

## 2015-05-27 LAB — PRO B NATRIURETIC PEPTIDE: Pro B Natriuretic peptide (BNP): 82.2 pg/mL (ref <126)

## 2015-05-27 NOTE — Telephone Encounter (Signed)
-----   Message from Boykin Nearing, MD sent at 05/27/2015  9:01 AM EDT ----- proBNP normal, this lab assess for acute congestive heart failure

## 2015-05-27 NOTE — Telephone Encounter (Signed)
LVM to return call.

## 2015-05-28 ENCOUNTER — Ambulatory Visit (HOSPITAL_COMMUNITY): Admission: RE | Admit: 2015-05-28 | Payer: Medicare Other | Source: Ambulatory Visit

## 2015-05-29 ENCOUNTER — Ambulatory Visit: Payer: Medicare Other | Admitting: Podiatry

## 2015-06-02 ENCOUNTER — Other Ambulatory Visit: Payer: Self-pay | Admitting: Family Medicine

## 2015-06-03 DIAGNOSIS — R531 Weakness: Secondary | ICD-10-CM | POA: Diagnosis not present

## 2015-06-03 DIAGNOSIS — Z7982 Long term (current) use of aspirin: Secondary | ICD-10-CM | POA: Diagnosis not present

## 2015-06-03 DIAGNOSIS — R110 Nausea: Secondary | ICD-10-CM | POA: Diagnosis not present

## 2015-06-03 DIAGNOSIS — Z79899 Other long term (current) drug therapy: Secondary | ICD-10-CM | POA: Diagnosis not present

## 2015-06-03 DIAGNOSIS — Z794 Long term (current) use of insulin: Secondary | ICD-10-CM | POA: Diagnosis not present

## 2015-06-03 DIAGNOSIS — J449 Chronic obstructive pulmonary disease, unspecified: Secondary | ICD-10-CM | POA: Diagnosis not present

## 2015-06-03 DIAGNOSIS — R05 Cough: Secondary | ICD-10-CM | POA: Diagnosis not present

## 2015-06-03 DIAGNOSIS — E119 Type 2 diabetes mellitus without complications: Secondary | ICD-10-CM | POA: Diagnosis not present

## 2015-06-03 DIAGNOSIS — R61 Generalized hyperhidrosis: Secondary | ICD-10-CM | POA: Diagnosis not present

## 2015-06-03 DIAGNOSIS — R031 Nonspecific low blood-pressure reading: Secondary | ICD-10-CM | POA: Diagnosis not present

## 2015-06-03 DIAGNOSIS — F17200 Nicotine dependence, unspecified, uncomplicated: Secondary | ICD-10-CM | POA: Diagnosis not present

## 2015-06-03 DIAGNOSIS — R079 Chest pain, unspecified: Secondary | ICD-10-CM | POA: Diagnosis not present

## 2015-06-03 DIAGNOSIS — L409 Psoriasis, unspecified: Secondary | ICD-10-CM | POA: Diagnosis not present

## 2015-06-03 DIAGNOSIS — I10 Essential (primary) hypertension: Secondary | ICD-10-CM | POA: Diagnosis not present

## 2015-06-05 ENCOUNTER — Telehealth: Payer: Self-pay | Admitting: Family Medicine

## 2015-06-05 MED ORDER — FLUTICASONE PROPIONATE 50 MCG/ACT NA SUSP: 2.0000 | Freq: Every day | NASAL | Status: DC

## 2015-06-05 NOTE — Telephone Encounter (Signed)
Patient called requesting to speak to nurse regarding pain management referral and wants to get higher dosage of nasal spray , patient states the one he is taking right now is not helping much ,please f/u

## 2015-06-05 NOTE — Telephone Encounter (Signed)
verified date of birth Pt aware of results  Verbalize understanding   Requesting Flonase refills Refill send to [facility]

## 2015-06-06 ENCOUNTER — Other Ambulatory Visit: Payer: Self-pay | Admitting: Family Medicine

## 2015-06-06 ENCOUNTER — Telehealth: Payer: Self-pay | Admitting: Family Medicine

## 2015-06-06 DIAGNOSIS — J309 Allergic rhinitis, unspecified: Secondary | ICD-10-CM

## 2015-06-06 NOTE — Telephone Encounter (Signed)
Patient called requesting medication refill for Diabetic machine plus test strips send to [facility] . Please f/u

## 2015-06-06 NOTE — Telephone Encounter (Signed)
Patient called requesting diabetic meter and test strips, test strips have refills just needs to be transferred, patient would like meter sent to [facility]

## 2015-06-12 ENCOUNTER — Other Ambulatory Visit: Payer: Self-pay | Admitting: Family Medicine

## 2015-06-12 NOTE — Telephone Encounter (Signed)
Patient called to request a stronger dosage of his nasal spray, patient stated that the current dosage is not helping his congestion. Please f/u

## 2015-06-17 ENCOUNTER — Telehealth: Payer: Self-pay | Admitting: Family Medicine

## 2015-06-17 MED ORDER — TRIAMCINOLONE ACETONIDE 55 MCG/ACT NA AERO: 2.0000 | INHALATION_SPRAY | Freq: Every day | NASAL | Status: DC

## 2015-06-17 NOTE — Telephone Encounter (Signed)
I'm not sure which pain clinic patient is talking about if is [facility] or [facility]  I called patient no answer I will try later thanks .

## 2015-06-17 NOTE — Telephone Encounter (Signed)
That is the  Strongest dose of flonase, Sent in nasacort to replace flonase

## 2015-06-17 NOTE — Telephone Encounter (Signed)
Patient called requesting a referral for a different pain management location Patient stated that at previous pain management place, that they had caused him more pain when they tried to stick a needle in his back. Please follow up.

## 2015-06-17 NOTE — Telephone Encounter (Signed)
Referral placedon 05/20/2015

## 2015-06-18 ENCOUNTER — Other Ambulatory Visit: Payer: Self-pay | Admitting: Family Medicine

## 2015-06-19 ENCOUNTER — Other Ambulatory Visit: Payer: Self-pay | Admitting: Family Medicine

## 2015-06-19 MED ORDER — MOMETASONE FUROATE 50 MCG/ACT NA SUSP: NASAL | Status: DC

## 2015-06-26 ENCOUNTER — Telehealth: Payer: Self-pay | Admitting: Family Medicine

## 2015-06-26 NOTE — Telephone Encounter (Signed)
Diane from [facility] called regarding the [facility] form, she stated that the patient needed to move out as soon as possible, and requires this form filled out. Patient PCP will not be in clinic until Wednesday. [facility] stated it was urgent. Form placed on medical directors desk.

## 2015-06-26 NOTE — Telephone Encounter (Signed)
Paperwork was completed and signed by Market researcher and faxed back to [facility] at Fx: [phone]

## 2015-06-26 NOTE — Telephone Encounter (Signed)
Patient called to check on status of the FL2 form. He is currently homeless and needs the form in order to find somewhere to live.

## 2015-06-30 ENCOUNTER — Telehealth: Payer: Self-pay | Admitting: Family Medicine

## 2015-06-30 NOTE — Telephone Encounter (Signed)
Patient is requesting to up the dosage due to pain on hip...please f/u

## 2015-07-02 NOTE — Telephone Encounter (Signed)
Form completed by Dr. Doreene Burke

## 2015-07-17 ENCOUNTER — Telehealth: Payer: Self-pay | Admitting: Family Medicine

## 2015-07-17 NOTE — Telephone Encounter (Signed)
Patient called for a med refill on Flonase. Patient stated that he would want it sent to [facility] in [place]. Patient would also like to go to a pain clinic because he is in pain. Please f/u with pt.

## 2015-07-21 NOTE — Telephone Encounter (Signed)
Patient called requesting med refill. Patient does not know what med it is. Patient stated that the PCP wrote down all the med that he needed, and the pharmacy is not giving it to him. Patient also stated that the pharmacy is charging for his medication, patient has Medicaid and Medicare. Patient also requested to go to a Pain Clinic. Please f/u with pt.

## 2015-07-22 NOTE — Telephone Encounter (Signed)
Patient called and requested medication for his potassium levels, patient stated that he feels very weak and with out energy. Patient also wanted something to treat for his asthma. Please f/u with pt.

## 2015-07-24 NOTE — Telephone Encounter (Signed)
Patient called stating that he is not getting all the medications that he used to and would like to speak to a nurse. Patient also requested a med refill for his Flonase, send medication to [facility]. Please f/u.

## 2015-07-25 ENCOUNTER — Telehealth: Payer: Self-pay

## 2015-07-25 ENCOUNTER — Telehealth: Payer: Self-pay | Admitting: Family Medicine

## 2015-07-25 DIAGNOSIS — J449 Chronic obstructive pulmonary disease, unspecified: Secondary | ICD-10-CM

## 2015-07-25 MED ORDER — ALBUTEROL SULFATE HFA 108 (90 BASE) MCG/ACT IN AERS: 2.0000 | INHALATION_SPRAY | Freq: Four times a day (QID) | RESPIRATORY_TRACT | Status: DC | PRN

## 2015-07-25 MED ORDER — FLUTICASONE-SALMETEROL 100-50 MCG/DOSE IN AEPB: 1.0000 | INHALATION_SPRAY | Freq: Two times a day (BID) | RESPIRATORY_TRACT | Status: DC

## 2015-07-25 NOTE — Telephone Encounter (Signed)
Patient called stating that he is having asthma attacks and needs his albuterol and advair called in as soon as possible. Please f/u

## 2015-07-25 NOTE — Telephone Encounter (Signed)
Nurse called [facility]. Per pharmacy patient needs Advair. Last ordered in May. Pharmacy does not have Advair or ProAir not on current, active orders on FL2 signed by Dr. Adrian Blackwater. Per pharmacy patient has refills for a year and only needs Advair and/or ProAir.  Pharmacy has patient list:  amitiza 34mcg QAM  amitriptyline 25mg  QHS  aspirin 81mg  QAM  lipitor 40mg  QHS  zyrtec 10mg  QAM  clonazepam 1mg  TID  lexapro 20mg  QAM  finestiride 5mg  QAM  neurontin 800mg  4 times daily  imdur 30mg  QAM  lactulose 10g 24ml PRN  lisinopril 10mg  QAM  lopressor 25mg  BID  nifedipine ER 90mg  QAM  novolog flex pen sliding scale  omeprazole 20mg  QAM  torsemide 20mg  4 tab every QAM  Vit D 50,000 unit once a week ketoconazole 2% cream BID  tylenol #3 q8h PRN

## 2015-07-25 NOTE — Telephone Encounter (Signed)
Patient called requesting a med refill on Flonase, patient stated his nose is stuffed up. Please f/u with pt.

## 2015-07-25 NOTE — Telephone Encounter (Signed)
Nurse spoke to Dr. Adrian Blackwater. Send Advair and ProAir inhaler to pharmacy per Dr. Adrian Blackwater.  Medications sent. Nurse called patient to make him aware of prescriptions being sent.  No answer, no voice mail.

## 2015-07-25 NOTE — Telephone Encounter (Signed)
Patient called nurse, verified date of birth. Patient states he needs all of his medications refilled. He explains he used to take 24 pills but now he does not have any medication. He explains the pharmacy says medicaid/medicare will not pay for his medications.  Some of the medications have refills. Nurse will call [facility] pharmacy.

## 2015-07-25 NOTE — Telephone Encounter (Signed)
Patient fell off a picnic table and hurt his hip. Patient fell about 2-3 weeks ago and never was treated. Pt is hoping to be prescribed more pain medicince, he is stating that he has been taking goody powder since the fall. Please follow up with pt.

## 2015-07-26 ENCOUNTER — Encounter (HOSPITAL_COMMUNITY): Payer: Self-pay | Admitting: Emergency Medicine

## 2015-07-26 ENCOUNTER — Inpatient Hospital Stay (HOSPITAL_COMMUNITY)
Admission: EM | Admit: 2015-07-26 | Discharge: 2015-07-30 | DRG: 914 | Disposition: A | Discharge status: Home / Self Care | Payer: Medicare Other | Attending: Internal Medicine | Admitting: Internal Medicine

## 2015-07-26 ENCOUNTER — Emergency Department (HOSPITAL_COMMUNITY): Payer: Medicare Other

## 2015-07-26 DIAGNOSIS — M545 Low back pain, unspecified: Secondary | ICD-10-CM

## 2015-07-26 DIAGNOSIS — T528X1A Toxic effect of other organic solvents, accidental (unintentional), initial encounter: Secondary | ICD-10-CM

## 2015-07-26 DIAGNOSIS — J449 Chronic obstructive pulmonary disease, unspecified: Secondary | ICD-10-CM | POA: Diagnosis present

## 2015-07-26 DIAGNOSIS — T1491 Suicide attempt: Secondary | ICD-10-CM | POA: Diagnosis not present

## 2015-07-26 DIAGNOSIS — F1020 Alcohol dependence, uncomplicated: Secondary | ICD-10-CM | POA: Diagnosis present

## 2015-07-26 DIAGNOSIS — G8929 Other chronic pain: Secondary | ICD-10-CM

## 2015-07-26 DIAGNOSIS — Z8249 Family history of ischemic heart disease and other diseases of the circulatory system: Secondary | ICD-10-CM

## 2015-07-26 DIAGNOSIS — F1320 Sedative, hypnotic or anxiolytic dependence, uncomplicated: Secondary | ICD-10-CM | POA: Diagnosis present

## 2015-07-26 DIAGNOSIS — T518X1A Toxic effect of other alcohols, accidental (unintentional), initial encounter: Secondary | ICD-10-CM | POA: Diagnosis not present

## 2015-07-26 DIAGNOSIS — T511X1A Toxic effect of methanol, accidental (unintentional), initial encounter: Secondary | ICD-10-CM | POA: Diagnosis not present

## 2015-07-26 DIAGNOSIS — Z833 Family history of diabetes mellitus: Secondary | ICD-10-CM

## 2015-07-26 DIAGNOSIS — Z888 Allergy status to other drugs, medicaments and biological substances status: Secondary | ICD-10-CM

## 2015-07-26 DIAGNOSIS — Z8674 Personal history of sudden cardiac arrest: Secondary | ICD-10-CM

## 2015-07-26 DIAGNOSIS — E1165 Type 2 diabetes mellitus with hyperglycemia: Secondary | ICD-10-CM | POA: Diagnosis present

## 2015-07-26 DIAGNOSIS — F17210 Nicotine dependence, cigarettes, uncomplicated: Secondary | ICD-10-CM | POA: Diagnosis present

## 2015-07-26 DIAGNOSIS — D6959 Other secondary thrombocytopenia: Secondary | ICD-10-CM | POA: Diagnosis present

## 2015-07-26 DIAGNOSIS — I2510 Atherosclerotic heart disease of native coronary artery without angina pectoris: Secondary | ICD-10-CM | POA: Diagnosis present

## 2015-07-26 DIAGNOSIS — Z452 Encounter for adjustment and management of vascular access device: Secondary | ICD-10-CM

## 2015-07-26 DIAGNOSIS — E872 Acidosis: Secondary | ICD-10-CM | POA: Diagnosis present

## 2015-07-26 DIAGNOSIS — Z7982 Long term (current) use of aspirin: Secondary | ICD-10-CM

## 2015-07-26 DIAGNOSIS — I10 Essential (primary) hypertension: Secondary | ICD-10-CM | POA: Diagnosis not present

## 2015-07-26 DIAGNOSIS — N179 Acute kidney failure, unspecified: Secondary | ICD-10-CM | POA: Diagnosis not present

## 2015-07-26 DIAGNOSIS — Z885 Allergy status to narcotic agent status: Secondary | ICD-10-CM

## 2015-07-26 DIAGNOSIS — T5791XA Toxic effect of unspecified inorganic substance, accidental (unintentional), initial encounter: Secondary | ICD-10-CM | POA: Diagnosis not present

## 2015-07-26 DIAGNOSIS — Z79899 Other long term (current) drug therapy: Secondary | ICD-10-CM

## 2015-07-26 DIAGNOSIS — F259 Schizoaffective disorder, unspecified: Secondary | ICD-10-CM | POA: Diagnosis present

## 2015-07-26 DIAGNOSIS — Z915 Personal history of self-harm: Secondary | ICD-10-CM

## 2015-07-26 DIAGNOSIS — Z794 Long term (current) use of insulin: Secondary | ICD-10-CM

## 2015-07-26 DIAGNOSIS — E11649 Type 2 diabetes mellitus with hypoglycemia without coma: Secondary | ICD-10-CM | POA: Diagnosis not present

## 2015-07-26 DIAGNOSIS — I517 Cardiomegaly: Secondary | ICD-10-CM | POA: Diagnosis not present

## 2015-07-26 DIAGNOSIS — T528X2A Toxic effect of other organic solvents, intentional self-harm, initial encounter: Secondary | ICD-10-CM | POA: Diagnosis present

## 2015-07-26 DIAGNOSIS — I252 Old myocardial infarction: Secondary | ICD-10-CM

## 2015-07-26 LAB — URINALYSIS, ROUTINE W REFLEX MICROSCOPIC
BILIRUBIN URINE: NEGATIVE
Glucose, UA: NEGATIVE mg/dL
HGB URINE DIPSTICK: NEGATIVE
KETONES UR: NEGATIVE mg/dL
Leukocytes, UA: NEGATIVE
NITRITE: NEGATIVE
PH: 6 (ref 5.0–8.0)
Protein, ur: NEGATIVE mg/dL
UROBILINOGEN UA: 0.2 mg/dL (ref 0.0–1.0)

## 2015-07-26 LAB — CBC WITH DIFFERENTIAL/PLATELET
BASOS ABS: 0.1 10*3/uL (ref 0.0–0.1)
BASOS PCT: 1 %
Eosinophils Absolute: 0.3 10*3/uL (ref 0.0–0.7)
Eosinophils Relative: 4 %
HEMATOCRIT: 42.5 % (ref 39.0–52.0)
Hemoglobin: 15.2 g/dL (ref 13.0–17.0)
Lymphocytes Relative: 34 %
Lymphs Abs: 2.4 10*3/uL (ref 0.7–4.0)
MCH: 33.7 pg (ref 26.0–34.0)
MCHC: 35.8 g/dL (ref 30.0–36.0)
MCV: 94.2 fL (ref 78.0–100.0)
MONO ABS: 0.6 10*3/uL (ref 0.1–1.0)
MONOS PCT: 9 %
Neutro Abs: 3.6 10*3/uL (ref 1.7–7.7)
Neutrophils Relative %: 52 %
PLATELETS: 152 10*3/uL (ref 150–400)
RBC: 4.51 MIL/uL (ref 4.22–5.81)
RDW: 13.3 % (ref 11.5–15.5)
WBC: 7 10*3/uL (ref 4.0–10.5)

## 2015-07-26 LAB — COMPREHENSIVE METABOLIC PANEL
ALBUMIN: 4 g/dL (ref 3.5–5.0)
ALK PHOS: 54 U/L (ref 38–126)
ALT: 15 U/L — AB (ref 17–63)
AST: 16 U/L (ref 15–41)
Anion gap: 11 (ref 5–15)
BILIRUBIN TOTAL: 0.6 mg/dL (ref 0.3–1.2)
BUN: 8 mg/dL (ref 6–20)
CO2: 23 mmol/L (ref 22–32)
Calcium: 8.7 mg/dL — ABNORMAL LOW (ref 8.9–10.3)
Chloride: 102 mmol/L (ref 101–111)
Creatinine, Ser: 1.07 mg/dL (ref 0.61–1.24)
GFR calc Af Amer: >60 mL/min (ref >60)
GFR calc non Af Amer: >60 mL/min (ref >60)
GLUCOSE: 128 mg/dL — AB (ref 65–99)
Potassium: 3.5 mmol/L (ref 3.5–5.1)
SODIUM: 136 mmol/L (ref 135–145)
TOTAL PROTEIN: 6.7 g/dL (ref 6.5–8.1)

## 2015-07-26 LAB — ETHANOL: Alcohol, Ethyl (B): <5 mg/dL (ref <5)

## 2015-07-26 LAB — LIPASE, BLOOD: LIPASE: 41 U/L (ref 22–51)

## 2015-07-26 LAB — RAPID URINE DRUG SCREEN, HOSP PERFORMED
Amphetamines: NOT DETECTED
BARBITURATES: NOT DETECTED
Benzodiazepines: NOT DETECTED
COCAINE: NOT DETECTED
Opiates: POSITIVE — AB
TETRAHYDROCANNABINOL: NOT DETECTED

## 2015-07-26 LAB — ACETAMINOPHEN LEVEL

## 2015-07-26 LAB — I-STAT CHEM 8, ED
BUN: 6 mg/dL (ref 6–20)
CALCIUM ION: 1.18 mmol/L (ref 1.12–1.23)
Chloride: 99 mmol/L — ABNORMAL LOW (ref 101–111)
Creatinine, Ser: 1.1 mg/dL (ref 0.61–1.24)
Glucose, Bld: 122 mg/dL — ABNORMAL HIGH (ref 65–99)
HEMATOCRIT: 44 % (ref 39.0–52.0)
HEMOGLOBIN: 15 g/dL (ref 13.0–17.0)
Potassium: 3.5 mmol/L (ref 3.5–5.1)
SODIUM: 137 mmol/L (ref 135–145)
TCO2: 21 mmol/L (ref 0–100)

## 2015-07-26 LAB — SALICYLATE LEVEL: SALICYLATE LVL: 6.7 mg/dL (ref 2.8–30.0)

## 2015-07-26 MED ORDER — SODIUM CHLORIDE 0.9 % IV SOLN
1500.0000 mg | Freq: Once | INTRAVENOUS | Status: AC
  Administered 2015-07-26: 1500 mg via INTRAVENOUS
  Filled 2015-07-26: qty 1.5

## 2015-07-26 MED ORDER — SODIUM CHLORIDE 0.9 % IV BOLUS (SEPSIS)
500.0000 mL | Freq: Once | INTRAVENOUS | Status: AC
  Administered 2015-07-26: 500 mL via INTRAVENOUS

## 2015-07-26 MED ORDER — ALBUTEROL SULFATE (2.5 MG/3ML) 0.083% IN NEBU
5.0000 mg | INHALATION_SOLUTION | Freq: Once | RESPIRATORY_TRACT | Status: AC
  Administered 2015-07-26: 5 mg via RESPIRATORY_TRACT
  Filled 2015-07-26: qty 6

## 2015-07-26 MED ORDER — SODIUM CHLORIDE 0.9 % IV SOLN
INTRAVENOUS | Status: DC
  Administered 2015-07-26: 22:00:00 via INTRAVENOUS

## 2015-07-26 MED ORDER — ONDANSETRON HCL 4 MG/2ML IJ SOLN
4.0000 mg | Freq: Once | INTRAMUSCULAR | Status: AC
  Administered 2015-07-26: 4 mg via INTRAVENOUS
  Filled 2015-07-26: qty 2

## 2015-07-26 MED ORDER — SODIUM CHLORIDE 0.9 % IV SOLN
15.0000 mg/kg | Freq: Once | INTRAVENOUS | Status: DC
  Filled 2015-07-26: qty 1.63

## 2015-07-26 NOTE — ED Provider Notes (Signed)
CSN: [id number]     Arrival date & time 07/26/15  2021 History  By signing my name below, I, Meriel Pica, attest that this documentation has been prepared under the direction and in the presence of Fredia Sorrow, MD. Electronically Signed: Meriel Pica, ED Scribe. 07/26/2015. 9:06 PM.   Chief Complaint  Patient presents with  . Ingestion   The history is provided by the patient. The history is limited by the condition of the patient. No language interpreter was used.   LEVEL 5 CAVEAT: ALTERED MENTAL STATUS   HPI Comments: [name] is a 54 y.o. male, with a significant PMhx, who presents to the Emergency Department complaining of a possible ingestion of antifreeze with last time consumed being 1 hour ago. Pt reports he found and drank a bottle of green liquid that appeared to be a margarita mix in the woods from 3 PM to 8 PM. After consumption he believed the bottle to actually be antifreeze due to the taste, however the bottle was not labeled antifreeze. He states he has consumed antifreeze 6 times in the past when he was treated at [facility] and is familiar with the taste. He did not bring the supposed bottle of antifreeze with him to the ED. Pt also reports consuming a tylenol 3, klonopin, and 1 beer approximately 7 hours ago. Denies urinating or vomiting since consumption.   Past Medical History  Diagnosis Date  . Diabetes mellitus without complication ([place]) 2409  . Hypertension 2008  . Schizoaffective disorder ([place]) 2006   . COPD (chronic obstructive pulmonary disease) ([place])   . Alcohol dependence ([place]) 1979    stated abusing ETOH at age 54   . Benzodiazepine dependence ([place])   . Cocaine abuse   . MI (myocardial infarction) ([place])   . Cardiac arrest [facility])    History reviewed. No pertinent past surgical history. Family History  Problem Relation Age of Onset  . Heart disease Mother   . Heart disease Father   . Heart disease Maternal Grandmother   . Diabetes Maternal  Grandmother    Social History  Substance Use Topics  . Smoking status: Heavy Tobacco Smoker -- 1.50 packs/day    Types: Cigarettes  . Smokeless tobacco: None  . Alcohol Use: No    Review of Systems  Unable to perform ROS: Mental status change  Gastrointestinal: Negative for vomiting.   Allergies  Codeine; Haldol; Seroquel; and Trazodone and nefazodone  Home Medications   Prior to Admission medications   Medication Sig Start Date End Date Taking? Authorizing Provider  acetaminophen-codeine (TYLENOL #3) 300-30 MG per tablet Take 1 tablet by mouth every 8 (eight) hours as needed for moderate pain. 05/15/15  Yes Josalyn Funches, MD  amitriptyline (ELAVIL) 25 MG tablet Take 25 mg by mouth at bedtime.   Yes Historical Provider, MD  aspirin 81 MG chewable tablet Chew 81 mg by mouth daily.   Yes Historical Provider, MD  atorvastatin (LIPITOR) 40 MG tablet Take 1 tablet (40 mg total) by mouth daily. 11/10/14  Yes Josalyn Funches, MD  cetirizine (ZYRTEC) 10 MG tablet Take 1 tablet (10 mg total) by mouth daily. 03/28/15  Yes Josalyn Funches, MD  clonazePAM (KLONOPIN) 1 MG tablet Take 1 tablet (1 mg total) by mouth 3 (three) times daily. 05/15/15  Yes Josalyn Funches, MD  escitalopram (LEXAPRO) 20 MG tablet Take 1 tablet (20 mg total) by mouth daily. 11/07/14  Yes Josalyn Funches, MD  finasteride (PROSCAR) 5 MG tablet Take 1 tablet (  5 mg total) by mouth daily. 02/06/15  Yes Josalyn Funches, MD  gabapentin (NEURONTIN) 800 MG tablet Take 800 mg by mouth 4 (four) times daily.   Yes Historical Provider, MD  insulin aspart (NOVOLOG FLEXPEN) 100 UNIT/ML FlexPen Inject 15 Units into the skin 3 (three) times daily with meals. 150-200= 17 units 201-300= 19 units 301-400= 23 units 401-500= 27 units >500= 30 units and call MD   Yes Historical Provider, MD  isosorbide mononitrate (IMDUR) 30 MG 24 hr tablet Take 30 mg by mouth daily.   Yes Historical Provider, MD  ketoconazole (NIZORAL) 2 % cream Apply 1  application topically 2 (two) times daily as needed for irritation. Patient taking differently: Apply 1 application topically 2 (two) times daily.  11/07/14  Yes Josalyn Funches, MD  lactulose (CHRONULAC) 10 GM/15ML solution Take 30 mLs (20 g total) by mouth daily as needed for mild constipation. 02/06/15  Yes Josalyn Funches, MD  lisinopril (PRINIVIL,ZESTRIL) 10 MG tablet Take 10 mg by mouth daily.   Yes Historical Provider, MD  lubiprostone (AMITIZA) 24 MCG capsule Take 24 mcg by mouth every morning.    Yes Historical Provider, MD  metoprolol tartrate (LOPRESSOR) 25 MG tablet Take 1 tablet (25 mg total) by mouth 2 (two) times daily. 11/07/14  Yes Josalyn Funches, MD  NIFEdipine (ADALAT CC) 90 MG 24 hr tablet Take 90 mg by mouth daily.   Yes Historical Provider, MD  omeprazole (PRILOSEC) 20 MG capsule Take 20 mg by mouth daily.   Yes Historical Provider, MD  torsemide (DEMADEX) 20 MG tablet Take 80 mg by mouth daily.   Yes Historical Provider, MD  Vitamin D, Ergocalciferol, (DRISDOL) 50000 UNITS CAPS capsule Take 50,000 Units by mouth every 7 (seven) days.   Yes Historical Provider, MD  acetaminophen (TYLENOL 8 HOUR) 650 MG CR tablet Take 1 tablet (650 mg total) by mouth 2 (two) times daily. 05/15/15   Josalyn Funches, MD  albuterol (PROVENTIL HFA;VENTOLIN HFA) 108 (90 BASE) MCG/ACT inhaler Inhale 2 puffs into the lungs every 6 (six) hours as needed for wheezing or shortness of breath. 07/25/15   Josalyn Funches, MD  betamethasone dipropionate (DIPROLENE) 0.05 % ointment Apply topically 2 (two) times daily. On hands 05/02/15   Boykin Nearing, MD  Dextromethorphan-Guaifenesin (CORICIDIN HBP CONGESTION/COUGH) 10-200 MG CAPS Take 1-2 capsules by mouth 3 (three) times daily as needed (cough or congeston). 11/19/14   Josalyn Funches, MD  Fluticasone-Salmeterol (ADVAIR) 100-50 MCG/DOSE AEPB Inhale 1 puff into the lungs 2 (two) times daily. 07/25/15   Josalyn Funches, MD  glucose blood test strip 1 each by Other  route 3 (three) times daily. ICD 10 E11.9 03/12/15   Boykin Nearing, MD  guaiFENesin (MUCINEX) 600 MG 12 hr tablet Take 1 tablet (600 mg total) by mouth 2 (two) times daily as needed for cough or to loosen phlegm. 05/02/15   Josalyn Funches, MD  Insulin Glargine (LANTUS SOLOSTAR) 100 UNIT/ML Solostar Pen Inject 40 Units into the skin daily at 10 pm.    Historical Provider, MD  loratadine (CLARITIN) 10 MG tablet Take 10 mg by mouth daily.    Historical Provider, MD  mometasone (NASONEX) 50 MCG/ACT nasal spray 2 prays into each nostril daily 06/19/15   Boykin Nearing, MD  Paliperidone Palmitate 234 MG/1.5ML SUSP Inject 234 mg into the muscle every 28 (twenty-eight) days.    Historical Provider, MD  terbinafine (LAMISIL) 250 MG tablet Take 1 tablet (250 mg total) by mouth daily. 11/14/14   Boykin Nearing, MD  BP 145/95 mmHg  Pulse 85  Temp(Src) 98.5 F (36.9 C) (Oral)  Resp 20  Ht 5\' 11"  (1.803 m)  Wt 240 lb (108.863 kg)  BMI 33.49 kg/m2  SpO2 95% Physical Exam  Constitutional: He appears well-developed and well-nourished. No distress.  HENT:  Head: Normocephalic and atraumatic.  Mouth/Throat: Oropharynx is clear and moist. No oropharyngeal exudate.  With black light, no fluorescence in the oropharynx or to the tongue.   Eyes: EOM are normal. Pupils are equal, round, and reactive to light.  Pupils normal, sclera erythematous bilaterally, EOM normal.   Neck: Neck supple.  Cardiovascular: Normal rate, regular rhythm and normal heart sounds.   No murmur heard. No swelling in ankles bilaterally.   Pulmonary/Chest: Effort normal and breath sounds normal. No respiratory distress. He has no wheezes.  Lungs clear bilaterally.   Abdominal: Soft. He exhibits distension. There is no tenderness.  Abdomen distended but with normal bowel sounds.   Musculoskeletal: Normal range of motion. He exhibits no edema.  Neurological: He is alert. No cranial nerve deficit. He exhibits normal muscle tone.  Coordination normal.  Skin: Skin is warm and dry.  Psychiatric: He has a normal mood and affect.  Nursing note and vitals reviewed.   ED Course  Procedures  DIAGNOSTIC STUDIES: Oxygen Saturation is 95% on RA, normal by my interpretation.    COORDINATION OF CARE: 9:02 PM Discussed treatment plan with pt at bedside and pt agreed to plan. Will consult with poison control.  10:26 PM With black light, no fluorescence in the oropharynx or to the tongue. Urine was also negative for fluorescein with black light exam. Discussed with poison control and they recommended giving initial bolus of Antazole antidote.   Labs Review Labs Reviewed  COMPREHENSIVE METABOLIC PANEL - Abnormal; Notable for the following:    Glucose, Bld 128 (*)    Calcium 8.7 (*)    ALT 15 (*)    All other components within normal limits  URINALYSIS, ROUTINE W REFLEX MICROSCOPIC (NOT AT [facility]) - Abnormal; Notable for the following:    Specific Gravity, Urine <1.005 (*)    All other components within normal limits  URINE RAPID DRUG SCREEN, HOSP PERFORMED - Abnormal; Notable for the following:    Opiates POSITIVE (*)    All other components within normal limits  ACETAMINOPHEN LEVEL - Abnormal; Notable for the following:    Acetaminophen (Tylenol), Serum <10 (*)    All other components within normal limits  I-STAT CHEM 8, ED - Abnormal; Notable for the following:    Chloride 99 (*)    Glucose, Bld 122 (*)    All other components within normal limits  CBC WITH DIFFERENTIAL/PLATELET  ETHANOL  LIPASE, BLOOD  SALICYLATE LEVEL  ETHYLENE GLYCOL  SALICYLATE LEVEL   Results for orders placed or performed during the hospital encounter of 07/26/15  Comprehensive metabolic panel  Result Value Ref Range   Sodium 136 135 - 145 mmol/L   Potassium 3.5 3.5 - 5.1 mmol/L   Chloride 102 101 - 111 mmol/L   CO2 23 22 - 32 mmol/L   Glucose, Bld 128 (H) 65 - 99 mg/dL   BUN 8 6 - 20 mg/dL   Creatinine, Ser 1.07 0.61 - 1.24 mg/dL    Calcium 8.7 (L) 8.9 - 10.3 mg/dL   Total Protein 6.7 6.5 - 8.1 g/dL   Albumin 4.0 3.5 - 5.0 g/dL   AST 16 15 - 41 U/L   ALT 15 (L) 17 - 63  U/L   Alkaline Phosphatase 54 38 - 126 U/L   Total Bilirubin 0.6 0.3 - 1.2 mg/dL   GFR calc non Af Amer >60 >60 mL/min   GFR calc Af Amer >60 >60 mL/min   Anion gap 11 5 - 15  CBC with Differential  Result Value Ref Range   WBC 7.0 4.0 - 10.5 K/uL   RBC 4.51 4.22 - 5.81 MIL/uL   Hemoglobin 15.2 13.0 - 17.0 g/dL   HCT 42.5 39.0 - 52.0 %   MCV 94.2 78.0 - 100.0 fL   MCH 33.7 26.0 - 34.0 pg   MCHC 35.8 30.0 - 36.0 g/dL   RDW 13.3 11.5 - 15.5 %   Platelets 152 150 - 400 K/uL   Neutrophils Relative % 52 %   Neutro Abs 3.6 1.7 - 7.7 K/uL   Lymphocytes Relative 34 %   Lymphs Abs 2.4 0.7 - 4.0 K/uL   Monocytes Relative 9 %   Monocytes Absolute 0.6 0.1 - 1.0 K/uL   Eosinophils Relative 4 %   Eosinophils Absolute 0.3 0.0 - 0.7 K/uL   Basophils Relative 1 %   Basophils Absolute 0.1 0.0 - 0.1 K/uL  Urinalysis, Routine w reflex microscopic (not at [facility])  Result Value Ref Range   Color, Urine YELLOW YELLOW   APPearance CLEAR CLEAR   Specific Gravity, Urine <1.005 (L) 1.005 - 1.030   pH 6.0 5.0 - 8.0   Glucose, UA NEGATIVE NEGATIVE mg/dL   Hgb urine dipstick NEGATIVE NEGATIVE   Bilirubin Urine NEGATIVE NEGATIVE   Ketones, ur NEGATIVE NEGATIVE mg/dL   Protein, ur NEGATIVE NEGATIVE mg/dL   Urobilinogen, UA 0.2 0.0 - 1.0 mg/dL   Nitrite NEGATIVE NEGATIVE   Leukocytes, UA NEGATIVE NEGATIVE  Urine rapid drug screen (hosp performed)  Result Value Ref Range   Opiates POSITIVE (A) NONE DETECTED   Cocaine NONE DETECTED NONE DETECTED   Benzodiazepines NONE DETECTED NONE DETECTED   Amphetamines NONE DETECTED NONE DETECTED   Tetrahydrocannabinol NONE DETECTED NONE DETECTED   Barbiturates NONE DETECTED NONE DETECTED  Ethanol  Result Value Ref Range   Alcohol, Ethyl (B) <5 <5 mg/dL  Lipase, blood  Result Value Ref Range   Lipase 41 22 - 51 U/L   Acetaminophen level  Result Value Ref Range   Acetaminophen (Tylenol), Serum <10 (L) 10 - 30 ug/mL  Salicylate level  Result Value Ref Range   Salicylate Lvl 6.7 2.8 - 30.0 mg/dL  I-Stat Chem 8, ED  Result Value Ref Range   Sodium 137 135 - 145 mmol/L   Potassium 3.5 3.5 - 5.1 mmol/L   Chloride 99 (L) 101 - 111 mmol/L   BUN 6 6 - 20 mg/dL   Creatinine, Ser 1.10 0.61 - 1.24 mg/dL   Glucose, Bld 122 (H) 65 - 99 mg/dL   Calcium, Ion 1.18 1.12 - 1.23 mmol/L   TCO2 21 0 - 100 mmol/L   Hemoglobin 15.0 13.0 - 17.0 g/dL   HCT 44.0 39.0 - 52.0 %     Imaging Review Dg Chest Port 1 View  07/26/2015   CLINICAL DATA:  54 year old who states that he may have ingested anti freeze (ethylene glycol) earlier today.  EXAM: PORTABLE CHEST 1 VIEW  COMPARISON:  06/03/2015 and earlier.  FINDINGS: Cardiac silhouette mildly enlarged for AP portable technique, unchanged. Lungs clear. Bronchovascular markings normal. Pulmonary vascularity normal. No visible pleural effusions. No pneumothorax.  IMPRESSION: Stable mild cardiomegaly.  No acute cardiopulmonary disease.   Electronically Signed  By: Evangeline Dakin M.D.   On: 07/26/2015 21:31   I have personally reviewed and evaluated these images and lab results as part of my medical decision-making.   EKG Interpretation   Date/Time:  Saturday July 26 2015 20:27:08 EDT Ventricular Rate:  85 PR Interval:  196 QRS Duration: 89 QT Interval:  392 QTC Calculation: 466 R Axis:   -76 Text Interpretation:  Sinus rhythm Left anterior fascicular block No  significant change since last tracing Confirmed by Raciel Caffrey  MD, Shamel Germond  [phone]) on 07/26/2015 8:45:06 PM      CRITICAL CARE Performed by: Fredia Sorrow Total critical care time: 30 Critical care time was exclusive of separately billable procedures and treating other patients. Critical care was necessary to treat or prevent imminent or life-threatening deterioration. Critical care was time spent  personally by me on the following activities: development of treatment plan with patient and/or surrogate as well as nursing, discussions with consultants, evaluation of patient's response to treatment, examination of patient, obtaining history from patient or surrogate, ordering and performing treatments and interventions, ordering and review of laboratory studies, ordering and review of radiographic studies, pulse oximetry and re-evaluation of patient's condition.    MDM   Final diagnoses:  Ingestion of substance, initial encounter   Patient with potential toxic ingestion of the filling glycol. Patient claims that he drank a green mixture that he thought was in a tequila bottle that he found in the woods from about 3:00 in the afternoon until 8 in the evening. States that the bottle was almost completely full. States it tasted like antifreeze to him because he drank it before and knows his taste. Bottle was not brought in by EMS. Patient states his nausea but there's been no vomiting.  Patient clinically without any severe signs of intoxication. No neurological symptoms. Patient may appear slightly intoxicated but denies dizziness does not seem to have any significant coordination loss with his muscles. There is no drooling no slurred speech no history of any seizures. No abnormal eye movements. Patient denies headache does possibly seem to be confused. Patient without any physical findings concerning for which would include elevated heart rate hyperventilation. Also no evidence of any metabolic acidosis on the labs. Patient's mouth does not fluoresce with Wood's lamp. Patient's urine does not fluoresce with Wood's lamp. Patient's urine does not have calcium oxalate crystals in it.  Still these things are not all confirmatory for significant ingestion of a filling glycol. Discussed with poison control. They recommended sending a filling glycol level to [facility] lab for confirmation of ingestion  tonight. Also recommended starting the antidote, Antizole. First bolus ordered as per recommendations from poison control. Poison control also discussed the case with the toxicologist. If the F filling glycol level is negative and patient did not have significant ingestion. In addition patient salicylate level was slightly elevated repeat level be checked. Patient had no ethanol in the system patient had no liver function test abnormalities. Patient also Tylenol level was negative.      I, Deysy Schabel, personally performed the services described in this documentation. All medical record entries made by the scribe were at my direction and in my presence.  I have reviewed the chart and discharge instructions and agree that the record reflects my personal performance and is accurate and complete. Ramona Ruark.  07/26/2015. 11:27 PM.      Fredia Sorrow, MD 07/26/15 [id number]

## 2015-07-26 NOTE — ED Notes (Addendum)
Pt states he was drinking something green mixed with tequila and thinks it was "antifreeze." bottle was not labled as antifreeze, states it was just green like [employer]. Also states he has numbness to left arms as he did "last time he drank the antifreeze 10 years ago"

## 2015-07-26 NOTE — ED Notes (Signed)
Pt states that he walked into the woods today and found a tequila bottle with green liquid and pt assumed with [employer].  Pt states that he drank it and stated that it tasted like antifreeze which pt states he had drank before about 10 years ago.

## 2015-07-27 ENCOUNTER — Inpatient Hospital Stay (HOSPITAL_COMMUNITY): Payer: Medicare Other

## 2015-07-27 DIAGNOSIS — Z452 Encounter for adjustment and management of vascular access device: Secondary | ICD-10-CM

## 2015-07-27 DIAGNOSIS — I252 Old myocardial infarction: Secondary | ICD-10-CM | POA: Diagnosis not present

## 2015-07-27 DIAGNOSIS — Z8249 Family history of ischemic heart disease and other diseases of the circulatory system: Secondary | ICD-10-CM | POA: Diagnosis not present

## 2015-07-27 DIAGNOSIS — T5791XA Toxic effect of unspecified inorganic substance, accidental (unintentional), initial encounter: Secondary | ICD-10-CM | POA: Diagnosis present

## 2015-07-27 DIAGNOSIS — I2510 Atherosclerotic heart disease of native coronary artery without angina pectoris: Secondary | ICD-10-CM | POA: Diagnosis not present

## 2015-07-27 DIAGNOSIS — E872 Acidosis: Secondary | ICD-10-CM | POA: Diagnosis present

## 2015-07-27 DIAGNOSIS — T528X2A Toxic effect of other organic solvents, intentional self-harm, initial encounter: Secondary | ICD-10-CM | POA: Diagnosis not present

## 2015-07-27 DIAGNOSIS — Z79899 Other long term (current) drug therapy: Secondary | ICD-10-CM | POA: Diagnosis not present

## 2015-07-27 DIAGNOSIS — I10 Essential (primary) hypertension: Secondary | ICD-10-CM | POA: Diagnosis not present

## 2015-07-27 DIAGNOSIS — F259 Schizoaffective disorder, unspecified: Secondary | ICD-10-CM | POA: Diagnosis not present

## 2015-07-27 DIAGNOSIS — Z8674 Personal history of sudden cardiac arrest: Secondary | ICD-10-CM | POA: Diagnosis not present

## 2015-07-27 DIAGNOSIS — M545 Low back pain: Secondary | ICD-10-CM | POA: Diagnosis not present

## 2015-07-27 DIAGNOSIS — D6959 Other secondary thrombocytopenia: Secondary | ICD-10-CM | POA: Diagnosis not present

## 2015-07-27 DIAGNOSIS — E11649 Type 2 diabetes mellitus with hypoglycemia without coma: Secondary | ICD-10-CM | POA: Diagnosis not present

## 2015-07-27 DIAGNOSIS — I120 Hypertensive chronic kidney disease with stage 5 chronic kidney disease or end stage renal disease: Secondary | ICD-10-CM | POA: Diagnosis not present

## 2015-07-27 DIAGNOSIS — Z833 Family history of diabetes mellitus: Secondary | ICD-10-CM | POA: Diagnosis not present

## 2015-07-27 DIAGNOSIS — J449 Chronic obstructive pulmonary disease, unspecified: Secondary | ICD-10-CM | POA: Diagnosis not present

## 2015-07-27 DIAGNOSIS — Z794 Long term (current) use of insulin: Secondary | ICD-10-CM | POA: Diagnosis not present

## 2015-07-27 DIAGNOSIS — Z915 Personal history of self-harm: Secondary | ICD-10-CM | POA: Diagnosis not present

## 2015-07-27 DIAGNOSIS — F17210 Nicotine dependence, cigarettes, uncomplicated: Secondary | ICD-10-CM | POA: Diagnosis not present

## 2015-07-27 DIAGNOSIS — F1320 Sedative, hypnotic or anxiolytic dependence, uncomplicated: Secondary | ICD-10-CM | POA: Diagnosis not present

## 2015-07-27 DIAGNOSIS — Z7982 Long term (current) use of aspirin: Secondary | ICD-10-CM | POA: Diagnosis not present

## 2015-07-27 DIAGNOSIS — Z885 Allergy status to narcotic agent status: Secondary | ICD-10-CM | POA: Diagnosis not present

## 2015-07-27 DIAGNOSIS — Z888 Allergy status to other drugs, medicaments and biological substances status: Secondary | ICD-10-CM | POA: Diagnosis not present

## 2015-07-27 DIAGNOSIS — T528X1A Toxic effect of other organic solvents, accidental (unintentional), initial encounter: Secondary | ICD-10-CM

## 2015-07-27 DIAGNOSIS — E1165 Type 2 diabetes mellitus with hyperglycemia: Secondary | ICD-10-CM | POA: Diagnosis not present

## 2015-07-27 DIAGNOSIS — T1491 Suicide attempt: Secondary | ICD-10-CM | POA: Diagnosis not present

## 2015-07-27 DIAGNOSIS — F1020 Alcohol dependence, uncomplicated: Secondary | ICD-10-CM | POA: Diagnosis not present

## 2015-07-27 DIAGNOSIS — N179 Acute kidney failure, unspecified: Secondary | ICD-10-CM | POA: Diagnosis not present

## 2015-07-27 DIAGNOSIS — E1129 Type 2 diabetes mellitus with other diabetic kidney complication: Secondary | ICD-10-CM | POA: Diagnosis not present

## 2015-07-27 LAB — BLOOD GAS, ARTERIAL
ACID-BASE DEFICIT: 6 mmol/L — AB (ref 0.0–2.0)
Bicarbonate: 19.2 mEq/L — ABNORMAL LOW (ref 20.0–24.0)
Drawn by: 105551
FIO2: 0.21
O2 SAT: 94.5 %
PCO2 ART: 40.5 mmHg (ref 35.0–45.0)
PH ART: 7.299 — AB (ref 7.350–7.450)
PO2 ART: 74.2 mmHg — AB (ref 80.0–100.0)
Patient temperature: 98.6

## 2015-07-27 LAB — BASIC METABOLIC PANEL
ANION GAP: 12 (ref 5–15)
BUN: 8 mg/dL (ref 6–20)
CALCIUM: 8.8 mg/dL — AB (ref 8.9–10.3)
CO2: 21 mmol/L — ABNORMAL LOW (ref 22–32)
Chloride: 107 mmol/L (ref 101–111)
Creatinine, Ser: 1.15 mg/dL (ref 0.61–1.24)
GFR calc Af Amer: >60 mL/min (ref >60)
GLUCOSE: 174 mg/dL — AB (ref 65–99)
POTASSIUM: 4.6 mmol/L (ref 3.5–5.1)
SODIUM: 140 mmol/L (ref 135–145)

## 2015-07-27 LAB — CBC
HCT: 42.5 % (ref 39.0–52.0)
HEMOGLOBIN: 14.5 g/dL (ref 13.0–17.0)
MCH: 32.7 pg (ref 26.0–34.0)
MCHC: 34.1 g/dL (ref 30.0–36.0)
MCV: 95.7 fL (ref 78.0–100.0)
PLATELETS: 140 10*3/uL — AB (ref 150–400)
RBC: 4.44 MIL/uL (ref 4.22–5.81)
RDW: 13.4 % (ref 11.5–15.5)
WBC: 5.5 10*3/uL (ref 4.0–10.5)

## 2015-07-27 LAB — URINALYSIS, ROUTINE W REFLEX MICROSCOPIC
Bilirubin Urine: NEGATIVE
GLUCOSE, UA: NEGATIVE mg/dL
Hgb urine dipstick: NEGATIVE
KETONES UR: NEGATIVE mg/dL
LEUKOCYTES UA: NEGATIVE
NITRITE: NEGATIVE
PROTEIN: NEGATIVE mg/dL
Specific Gravity, Urine: <1.005 — ABNORMAL LOW (ref 1.005–1.030)
UROBILINOGEN UA: 0.2 mg/dL (ref 0.0–1.0)
pH: 6 (ref 5.0–8.0)

## 2015-07-27 LAB — ETHYLENE GLYCOL
ETHYLENE GLYCOL LVL: 47 mg/dL — AB
Ethylene Glycol Lvl: 89 mg/dL — ABNORMAL HIGH

## 2015-07-27 LAB — GLUCOSE, CAPILLARY: Glucose-Capillary: 144 mg/dL — ABNORMAL HIGH (ref 65–99)

## 2015-07-27 LAB — COMPREHENSIVE METABOLIC PANEL
ALK PHOS: 65 U/L (ref 38–126)
ALT: 15 U/L — AB (ref 17–63)
ANION GAP: 8 (ref 5–15)
AST: 16 U/L (ref 15–41)
Albumin: 3.7 g/dL (ref 3.5–5.0)
BILIRUBIN TOTAL: 0.6 mg/dL (ref 0.3–1.2)
BUN: 7 mg/dL (ref 6–20)
CALCIUM: 8.9 mg/dL (ref 8.9–10.3)
CO2: 22 mmol/L (ref 22–32)
CREATININE: 0.98 mg/dL (ref 0.61–1.24)
Chloride: 107 mmol/L (ref 101–111)
Glucose, Bld: 137 mg/dL — ABNORMAL HIGH (ref 65–99)
Potassium: 5 mmol/L (ref 3.5–5.1)
SODIUM: 137 mmol/L (ref 135–145)
TOTAL PROTEIN: 6.2 g/dL — AB (ref 6.5–8.1)

## 2015-07-27 LAB — SALICYLATE LEVEL: SALICYLATE LVL: 7 mg/dL (ref 2.8–30.0)

## 2015-07-27 LAB — PHOSPHORUS: PHOSPHORUS: 4.1 mg/dL (ref 2.5–4.6)

## 2015-07-27 LAB — LIPASE, BLOOD: LIPASE: 33 U/L (ref 22–51)

## 2015-07-27 LAB — MRSA PCR SCREENING: MRSA by PCR: NEGATIVE

## 2015-07-27 MED ORDER — PYRIDOXINE HCL 100 MG/ML IJ SOLN
100.0000 mg | Freq: Every day | INTRAMUSCULAR | Status: DC
  Administered 2015-07-27 – 2015-07-30 (×4): 100 mg via INTRAVENOUS
  Filled 2015-07-27 (×5): qty 1

## 2015-07-27 MED ORDER — SODIUM CHLORIDE 0.9 % IV SOLN: 100.0000 mL | INTRAVENOUS | Status: DC | PRN

## 2015-07-27 MED ORDER — FENTANYL CITRATE (PF) 100 MCG/2ML IJ SOLN
12.5000 ug | INTRAMUSCULAR | Status: DC | PRN
  Administered 2015-07-27 – 2015-07-30 (×16): 12.5 ug via INTRAVENOUS
  Filled 2015-07-27 (×16): qty 2

## 2015-07-27 MED ORDER — ARFORMOTEROL TARTRATE 15 MCG/2ML IN NEBU
15.0000 ug | INHALATION_SOLUTION | Freq: Two times a day (BID) | RESPIRATORY_TRACT | Status: DC
  Administered 2015-07-29: 15 ug via RESPIRATORY_TRACT
  Filled 2015-07-27 (×8): qty 2

## 2015-07-27 MED ORDER — PANTOPRAZOLE SODIUM 40 MG IV SOLR
40.0000 mg | Freq: Two times a day (BID) | INTRAVENOUS | Status: DC
  Administered 2015-07-27 – 2015-07-30 (×7): 40 mg via INTRAVENOUS
  Filled 2015-07-27 (×8): qty 40

## 2015-07-27 MED ORDER — PNEUMOCOCCAL VAC POLYVALENT 25 MCG/0.5ML IJ INJ
0.5000 mL | INJECTION | INTRAMUSCULAR | Status: DC
  Filled 2015-07-27: qty 0.5

## 2015-07-27 MED ORDER — SODIUM BICARBONATE 8.4 % IV SOLN
INTRAVENOUS | Status: DC
  Administered 2015-07-27 – 2015-07-28 (×2): via INTRAVENOUS
  Filled 2015-07-27 (×3): qty 150

## 2015-07-27 MED ORDER — HEPARIN 1000 UNIT/ML FOR PERITONEAL DIALYSIS
1000.0000 [IU] | INTRAMUSCULAR | Status: DC | PRN
  Filled 2015-07-27 (×2): qty 6

## 2015-07-27 MED ORDER — HEPARIN SODIUM (PORCINE) 1000 UNIT/ML DIALYSIS: 1000.0000 [IU] | INTRAMUSCULAR | Status: DC | PRN

## 2015-07-27 MED ORDER — LACTULOSE 10 GM/15ML PO SOLN
20.0000 g | Freq: Every day | ORAL | Status: DC | PRN
  Filled 2015-07-27: qty 30

## 2015-07-27 MED ORDER — SODIUM CHLORIDE 0.9 % IV SOLN: INTRAVENOUS | Status: DC

## 2015-07-27 MED ORDER — FOLIC ACID 5 MG/ML IJ SOLN
1.0000 mg | Freq: Every day | INTRAMUSCULAR | Status: DC
  Administered 2015-07-27 – 2015-07-30 (×4): 1 mg via INTRAVENOUS
  Filled 2015-07-27 (×4): qty 0.2

## 2015-07-27 MED ORDER — SODIUM CHLORIDE 0.9 % IV SOLN
10.0000 mg/kg | Freq: Two times a day (BID) | INTRAVENOUS | Status: DC
  Administered 2015-07-28 – 2015-07-29 (×4): 1040 mg via INTRAVENOUS
  Filled 2015-07-27 (×8): qty 1.04

## 2015-07-27 MED ORDER — SODIUM CHLORIDE 0.9 % IV SOLN
10.0000 mg/kg | Freq: Once | INTRAVENOUS | Status: AC
  Administered 2015-07-27: 1040 mg via INTRAVENOUS
  Filled 2015-07-27: qty 1.04

## 2015-07-27 MED ORDER — HEPARIN SODIUM (PORCINE) 5000 UNIT/ML IJ SOLN
5000.0000 [IU] | Freq: Three times a day (TID) | INTRAMUSCULAR | Status: DC
  Administered 2015-07-27 – 2015-07-30 (×9): 5000 [IU] via SUBCUTANEOUS
  Filled 2015-07-27 (×12): qty 1

## 2015-07-27 MED ORDER — ALTEPLASE 2 MG IJ SOLR: 2.0000 mg | Freq: Once | INTRAMUSCULAR | Status: DC | PRN

## 2015-07-27 MED ORDER — LIDOCAINE HCL (PF) 1 % IJ SOLN: 5.0000 mL | INTRAMUSCULAR | Status: DC | PRN

## 2015-07-27 MED ORDER — CLONAZEPAM 0.5 MG PO TABS
0.5000 mg | ORAL_TABLET | Freq: Three times a day (TID) | ORAL | Status: DC | PRN
  Administered 2015-07-27 – 2015-07-30 (×9): 0.5 mg via ORAL
  Filled 2015-07-27 (×9): qty 1

## 2015-07-27 MED ORDER — SODIUM CHLORIDE 0.9 % IV SOLN
250.0000 mL | INTRAVENOUS | Status: DC | PRN
  Administered 2015-07-27: 250 mL via INTRAVENOUS

## 2015-07-27 MED ORDER — HEPARIN SODIUM (PORCINE) 1000 UNIT/ML DIALYSIS: 2000.0000 [IU] | INTRAMUSCULAR | Status: DC | PRN

## 2015-07-27 MED ORDER — SODIUM CHLORIDE 0.9 % IV SOLN
10.0000 mg/kg | Freq: Two times a day (BID) | INTRAVENOUS | Status: AC
  Administered 2015-07-27: 1040 mg via INTRAVENOUS
  Filled 2015-07-27: qty 1.04

## 2015-07-27 MED ORDER — BUDESONIDE 0.25 MG/2ML IN SUSP
0.2500 mg | Freq: Two times a day (BID) | RESPIRATORY_TRACT | Status: DC
  Administered 2015-07-29: 0.25 mg via RESPIRATORY_TRACT
  Filled 2015-07-27 (×6): qty 2

## 2015-07-27 MED ORDER — PENTAFLUOROPROP-TETRAFLUOROETH EX AERO: 1.0000 "application " | INHALATION_SPRAY | CUTANEOUS | Status: DC | PRN

## 2015-07-27 MED ORDER — INFLUENZA VAC SPLIT QUAD 0.5 ML IM SUSY
0.5000 mL | PREFILLED_SYRINGE | INTRAMUSCULAR | Status: DC
  Filled 2015-07-27: qty 0.5

## 2015-07-27 MED ORDER — THIAMINE HCL 100 MG/ML IJ SOLN
100.0000 mg | Freq: Every day | INTRAMUSCULAR | Status: DC
  Administered 2015-07-27 – 2015-07-28 (×2): 100 mg via INTRAVENOUS
  Filled 2015-07-27 (×2): qty 1

## 2015-07-27 MED ORDER — FINASTERIDE 5 MG PO TABS
5.0000 mg | ORAL_TABLET | Freq: Every day | ORAL | Status: DC
  Administered 2015-07-27 – 2015-07-30 (×4): 5 mg via ORAL
  Filled 2015-07-27 (×5): qty 1

## 2015-07-27 MED ORDER — LIDOCAINE-PRILOCAINE 2.5-2.5 % EX CREA: 1.0000 "application " | TOPICAL_CREAM | CUTANEOUS | Status: DC | PRN

## 2015-07-27 NOTE — ED Notes (Addendum)
EDP aware and reported if patient threatened to leave again due to not getting anything to eat or drink, to provide food or drink per request. Pt has not been given any food/drink po at this time.

## 2015-07-27 NOTE — ED Notes (Signed)
Pt continually calling out and asking nurse for something to drink. I have advised pt that he could not have anything to drink at this time. We are waiting for his labs to come back.

## 2015-07-27 NOTE — Consult Note (Addendum)
Renal Service Consult Note [name]  [name] 07/27/2015 [name] Requesting Physician:  Dr. Titus Mould  Reason for Consult:  Ethylene glycol poisoning HPI: The patient is a 54 y.o. year-old with hx of schizophrenia, previous suicide attempts with ethylene glycol who was admitted for ethylene glycol ingestion.  Hx of same in the past.  Ethylene glycol level is m89 g/dL drawn last night about 10:30 pm. Normal level is <5 mg/dL and dialysis is recommended for this level according to Poison Control.  Asked to see for dialysis.   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  Multiple suicide attempts, hx schizophrenia, see below.  Denies active CP, sob, n/v/d, abd pain.  No joint pain or rash. No fevers.    Chart review: 2003 - suicide attempt by self-injection with motor oil and taking 30 Xanax. Admitted to psych unit and rx'd per psych team.  2005 - suicide attempt by attempted hanging from an extension cord. Rx'd in psych ward.   08/2007 - ethylene glycol overdose, methonal overdose, hx DM/ HTN/ psych illness, suicide attempt treated with dialysis and Antizol w improvement 09/2007 - intentional OD of 20 tablets of 1 gm Glucophage. Rx'd in psych ward 11/2008 - attempted suicide taking 70 tablets of 325mg  ASA.  Salicylate level was in the 60's.  Rx'd with IVF's and improved. Renal fxn was stable.   Past Medical History  Past Medical History  Diagnosis Date  . Diabetes mellitus without complication ([place]) 0102  . Hypertension 2008  . Schizoaffective disorder ([place]) 2006   . COPD (chronic obstructive pulmonary disease) ([place])   . Alcohol dependence ([place]) 1979    stated abusing ETOH at age 17   . Benzodiazepine dependence ([place])   . Cocaine abuse   . MI (myocardial infarction) ([place])   . Cardiac arrest [facility])    Past Surgical History History reviewed. No pertinent past surgical  history. Family History  Family History  Problem Relation Age of Onset  . Heart disease Mother   . Heart disease Father   . Heart disease Maternal Grandmother   . Diabetes Maternal Grandmother    Social History  reports that he has been smoking Cigarettes.  He has been smoking about 1.50 packs per day. He does not have any smokeless tobacco history on file. He reports that he does not drink alcohol or use illicit drugs. Allergies  Allergies  Allergen Reactions  . Codeine Itching  . Haldol [Haloperidol Lactate] Other (See Comments)    Nightmares   . Seroquel [Quetiapine Fumarate] Other (See Comments)    Nightmares   . Trazodone And Nefazodone Other (See Comments)    Nightmares    Home medications Prior to Admission medications   Medication Sig Start Date End Date Taking? Authorizing Provider  acetaminophen-codeine (TYLENOL #3) 300-30 MG per tablet Take 1 tablet by mouth every 8 (eight) hours as needed for moderate pain. 05/15/15  Yes Josalyn Funches, MD  amitriptyline (ELAVIL) 25 MG tablet Take 25 mg by mouth at bedtime.   Yes Historical Provider, MD  aspirin 81 MG chewable tablet Chew 81 mg by mouth daily.   Yes Historical Provider, MD  atorvastatin (LIPITOR) 40 MG tablet Take 1 tablet (40 mg total) by mouth daily. 11/10/14  Yes Josalyn Funches, MD  cetirizine (ZYRTEC) 10 MG tablet Take 1 tablet (10 mg total) by mouth daily. 03/28/15  Yes Boykin Nearing, MD  clonazePAM (KLONOPIN) 1 MG tablet Take 1 tablet (1 mg total) by mouth 3 (three) times daily. 05/15/15  Yes Josalyn Funches, MD  escitalopram (LEXAPRO) 20 MG tablet Take 1 tablet (20 mg total) by mouth daily. 11/07/14  Yes Josalyn Funches, MD  finasteride (PROSCAR) 5 MG tablet Take 1 tablet (5 mg total) by mouth daily. 02/06/15  Yes Josalyn Funches, MD  gabapentin (NEURONTIN) 800 MG tablet Take 800 mg by mouth 4 (four) times daily.   Yes Historical Provider, MD  insulin aspart (NOVOLOG FLEXPEN) 100 UNIT/ML FlexPen Inject 15 Units into  the skin 3 (three) times daily with meals. 150-200= 17 units 201-300= 19 units 301-400= 23 units 401-500= 27 units >500= 30 units and call MD   Yes Historical Provider, MD  isosorbide mononitrate (IMDUR) 30 MG 24 hr tablet Take 30 mg by mouth daily.   Yes Historical Provider, MD  ketoconazole (NIZORAL) 2 % cream Apply 1 application topically 2 (two) times daily as needed for irritation. Patient taking differently: Apply 1 application topically 2 (two) times daily.  11/07/14  Yes Josalyn Funches, MD  lactulose (CHRONULAC) 10 GM/15ML solution Take 30 mLs (20 g total) by mouth daily as needed for mild constipation. 02/06/15  Yes Josalyn Funches, MD  lisinopril (PRINIVIL,ZESTRIL) 10 MG tablet Take 10 mg by mouth daily.   Yes Historical Provider, MD  lubiprostone (AMITIZA) 24 MCG capsule Take 24 mcg by mouth every morning.    Yes Historical Provider, MD  metoprolol tartrate (LOPRESSOR) 25 MG tablet Take 1 tablet (25 mg total) by mouth 2 (two) times daily. 11/07/14  Yes Josalyn Funches, MD  NIFEdipine (ADALAT CC) 90 MG 24 hr tablet Take 90 mg by mouth daily.   Yes Historical Provider, MD  omeprazole (PRILOSEC) 20 MG capsule Take 20 mg by mouth daily.   Yes Historical Provider, MD  torsemide (DEMADEX) 20 MG tablet Take 80 mg by mouth daily.   Yes Historical Provider, MD  Vitamin D, Ergocalciferol, (DRISDOL) 50000 UNITS CAPS capsule Take 50,000 Units by mouth every 7 (seven) days.   Yes Historical Provider, MD  acetaminophen (TYLENOL 8 HOUR) 650 MG CR tablet Take 1 tablet (650 mg total) by mouth 2 (two) times daily. 05/15/15   Josalyn Funches, MD  albuterol (PROVENTIL HFA;VENTOLIN HFA) 108 (90 BASE) MCG/ACT inhaler Inhale 2 puffs into the lungs every 6 (six) hours as needed for wheezing or shortness of breath. 07/25/15   Josalyn Funches, MD  betamethasone dipropionate (DIPROLENE) 0.05 % ointment Apply topically 2 (two) times daily. On hands 05/02/15   Boykin Nearing, MD  Dextromethorphan-Guaifenesin (CORICIDIN  HBP CONGESTION/COUGH) 10-200 MG CAPS Take 1-2 capsules by mouth 3 (three) times daily as needed (cough or congeston). 11/19/14   Josalyn Funches, MD  Fluticasone-Salmeterol (ADVAIR) 100-50 MCG/DOSE AEPB Inhale 1 puff into the lungs 2 (two) times daily. 07/25/15   Josalyn Funches, MD  glucose blood test strip 1 each by Other route 3 (three) times daily. ICD 10 E11.9 03/12/15   Boykin Nearing, MD  guaiFENesin (MUCINEX) 600 MG 12 hr tablet Take 1 tablet (600 mg total) by mouth 2 (two) times daily as needed for cough or to loosen phlegm. 05/02/15   Josalyn Funches, MD  Insulin Glargine (LANTUS SOLOSTAR) 100 UNIT/ML Solostar Pen Inject 40 Units into the skin daily at 10 pm.    Historical Provider, MD  loratadine (CLARITIN) 10 MG tablet Take 10 mg by mouth daily.    Historical Provider, MD  mometasone (NASONEX) 50 MCG/ACT nasal spray 2 prays  into each nostril daily 06/19/15   Boykin Nearing, MD  Paliperidone Palmitate 234 MG/1.5ML SUSP Inject 234 mg into the muscle every 28 (twenty-eight) days.    Historical Provider, MD  terbinafine (LAMISIL) 250 MG tablet Take 1 tablet (250 mg total) by mouth daily. 11/14/14   Boykin Nearing, MD   Liver Function Tests  Recent Labs Lab 07/26/15 2050  AST 16  ALT 15*  ALKPHOS 54  BILITOT 0.6  PROT 6.7  ALBUMIN 4.0    Recent Labs Lab 07/26/15 2050  LIPASE 41   CBC  Recent Labs Lab 07/26/15 2050 07/26/15 2134  WBC 7.0  --   NEUTROABS 3.6  --   HGB 15.2 15.0  HCT 42.5 44.0  MCV 94.2  --   PLT 152  --    Basic Metabolic Panel  Recent Labs Lab 07/26/15 2050 07/26/15 2134 07/27/15 0324  NA 136 137 140  K 3.5 3.5 4.6  CL 102 99* 107  CO2 23  --  21*  GLUCOSE 128* 122* 174*  BUN 8 6 8   CREATININE 1.07 1.10 1.15  CALCIUM 8.7*  --  8.8*    Filed Vitals:   07/27/15 0730 07/27/15 0800 07/27/15 0932 07/27/15 1106  BP: 171/90 158/78 143/64   Pulse: 72 77 64   Temp:    97.6 F (36.4 C)  TempSrc:    Oral  Resp: 19 17 16    Height:    5\' 11"   (1.803 m)  Weight:    103.5 kg (228 lb 2.8 oz)  SpO2: 98% 98% 100%    Exam Alert, calm, no distress, wdwn No rash, cyanosis or gangrene Sclera anicteric, throat clear No jvd Chest clear bilat RRR no mrg Abd soft, obese ntnd +bs no ascites GU normal male No LE or UE edema Neuro is alert, Ox 3, nf   Assessment: 1. Ethylene glycol poisoning - plan dialysis 6 hours with 2-hr post HD f/u level. No signs of acute renal failure at this time. Fomepizole ordered. Agree w bicarb gtt as well.  He will need additional fomepizole during and after HD due to the fact that dialysis removes it.  2. HTN on lisinopril/ MTP/ nifedipine/ torsemide - holding med now, BP ok. If bp lowering needed use nifedipine or MTP; avoid acei/ diuretics for now. Keep BP's up for now 3. Suicide attempt - hx of same in the past multiple events 4. Schizophrenia   Plan- 6 hour HD , fomepezil, IVF"s  Kelly Splinter MD [facility] pager [phone]    cell [phone] 07/27/2015, 12:46 PM

## 2015-07-27 NOTE — ED Notes (Signed)
Report given to carelink. Carelink reported would be to AP in approx 25 minutes.

## 2015-07-27 NOTE — ED Notes (Addendum)
Contacted AP AC regarding pt placement. AP AC reported pt to be admitted to [facility]M at [facility] and reported that bed would be assigned soon.

## 2015-07-27 NOTE — ED Notes (Signed)
Attempted report. Receiving Unit reported could not take patient at this time and was consulting [place]. AP Charge and [facility] also aware.

## 2015-07-27 NOTE — Progress Notes (Signed)
Pt arrived from [facility] via [place]. Pt arrived with personal belongings. Pt wants to keep 1 black cell phone and 1 black cell phone charger at the bedside. Also at the bedside are 2 red shoes and 1 blue top and 1 pair gray pants.  Pt has requested that the rest of his belongings be locked up with security. Security has been called and items will be walked to security. Items to be sent with security are 3 packs of cigarettes, 2 black cell phones, 2 lighters, 1 black and brown wallet, 1 blue pocket knife, 1 key chain with 1 silver key, 1 white metal necklace and 28 cents in change.

## 2015-07-27 NOTE — Procedures (Signed)
  I was present at this dialysis session, have reviewed the session itself and made  appropriate changes Kelly Splinter MD [place] pager [phone]    cell [phone] 07/27/2015, 7:12 PM

## 2015-07-27 NOTE — ED Notes (Signed)
Notified Clarene Critchley, RN at [facility] 2100 that pt en route to [facility].

## 2015-07-27 NOTE — ED Notes (Signed)
Poison control called and said they would call back in the morning.

## 2015-07-27 NOTE — ED Notes (Addendum)
Rounded on patient. Pt drinking water out of faucet in room. Pt fully dressed refusing for cardiac monitor,bp cuff, and pulse ox to be placed back on. EDP aware.

## 2015-07-27 NOTE — Procedures (Signed)
Central Venous Catheter Insertion Procedure Note ULYSESS WITZ [id number] [birth year]  Procedure: Insertion of Central Venous Catheter Indications: hd  Procedure Details Consent: Risks of procedure as well as the alternatives and risks of each were explained to the (patient/caregiver).  Consent for procedure obtained. Time Out: Verified patient identification, verified procedure, site/side was marked, verified correct patient position, special equipment/implants available, medications/allergies/relevent history reviewed, required imaging and test results available.  Performed  Maximum sterile technique was used including antiseptics, cap, gloves, gown, hand hygiene, mask and sheet. Skin prep: Chlorhexidine; local anesthetic administered A antimicrobial bonded/coated triple lumen catheter was placed in the left internal jugular vein using the Seldinger technique.  Evaluation Blood flow good Complications: No apparent complications Patient did tolerate procedure well. Chest X-ray ordered to verify placement.  CXR: pending.  Raylene Miyamoto 07/27/2015, 1:17 PM  [place]  Alhaji Mcneal J. Titus Mould, MD, [place] Pgr: [facility]

## 2015-07-27 NOTE — ED Notes (Signed)
Pt placed back on cardiac monitor,bp cuff, pulse ox by ED NT tech. nad noted.

## 2015-07-27 NOTE — ED Notes (Addendum)
Ethylene glycol- 89

## 2015-07-27 NOTE — Care Management Note (Signed)
Case Management Note  Patient Details  Name: [name] MRN: [id number] Date of Birth: Jun 29, 1961  Subjective/Objective:  Pt resident of [place], [place]; POCs: Marinus Maw (ACT team worker: [phone]), and  Roberts Gaudy, [place]. Assistant [facility], [place], [place]                  Action/Plan:   Expected Discharge Date:                  [facility]  In-House Referral:  Clinical Social Work  Discharge planning Services     Post Acute Care Choice:    Choice offered to:     DME Arranged:    DME Agency:     HH Arranged:    [place] Agency:     Status of Service:  In process, will continue to follow  Medicare Important Message Given:    Date Medicare IM Given:    Medicare IM give by:    Date Additional Medicare IM Given:    Additional Medicare Important Message give by:     If discussed at [place] of Stay Meetings, dates discussed:    Additional Comments:  Vergie Living, RN 07/27/2015, 8:13 PM

## 2015-07-27 NOTE — ED Notes (Signed)
Pt attempted to leave facility. Escorted back to room by Engineer, structural. Pt attempting to remove IV.

## 2015-07-27 NOTE — ED Notes (Addendum)
Pt received a meal tray and coke. EDP aware

## 2015-07-27 NOTE — ED Provider Notes (Signed)
Patient was left to change of shift to get results of his ethylene glycol level. When we attempted to call [facility] they are unaware of this test being performed there and  then tried to call lab core in [facility] there was no answer. We contacted poison control again. They say it could take 3 days to get the lab results. At this point repeat urine in [place] was done. Patient should have signs of toxicity and his blood work. I will then discuss him with the hospitalist and see if we should just discharge him or if he should be admitted for observation.  04:13 Poison Control states if the sample went to [facility] it can take 3 days to get results, if sent to [facility] would get back tonight  04:15 I spoke to our lab, she states they only send labs to [facility] and it was sent as a stat and it should be back tonight. States she doesn't have a number to speak to anyone there to check on the sample. Should get the result by fax.   05:00 lab called states results should be back in about an hour  06:00 ethylene glycol level is 89  06:05 Poison Control states with level over 25 will need dialysis. Will fax dialysis protocol for antizol (fomepizole).   06:20 D/W Dr Hinda Lenis, nephrology, feels patient will need vascular access and should be transferred to [facility]  06:23 D/W Dr Nehemiah Settle, hospitalist, will admit patient to step down to hospitalist service at [facility]. I am waiting for nephrology at [facility] to call back.   06:31 Dr Nehemiah Settle, states no step down beds, talk to PCCM to see if they will admit  06:38 Dr Jonnie Finner, nephrology at [facility], made aware of patient and need for dialysis  Discussed need to transfer to [facility] for dialysis. Pt is only concerned about getting pain medication for his hip and his abdomen.   07:10 Dr Titus Mould, PCCM accepts in transfer to [facility] to ICU (2100).  Results for orders placed or performed during the hospital encounter of 07/26/15  Result Value Ref Range   Alcohol, Ethyl (B) <5 <5 mg/dL   Lipase, blood  Result Value Ref Range   Lipase 41 22 - 51 U/L  Acetaminophen level  Result Value Ref Range   Acetaminophen (Tylenol), Serum <10 (L) 10 - 30 ug/mL  Salicylate level  Result Value Ref Range   Salicylate Lvl 6.7 2.8 - 36.6 mg/dL  Salicylate level  Result Value Ref Range   Salicylate Lvl 7.0 2.8 - 30.0 mg/dL  Urinalysis, Routine w reflex microscopic  Result Value Ref Range   Color, Urine YELLOW YELLOW   APPearance CLEAR CLEAR   Specific Gravity, Urine <1.005 (L) 1.005 - 1.030   pH 6.0 5.0 - 8.0   Glucose, UA NEGATIVE NEGATIVE mg/dL   Hgb urine dipstick NEGATIVE NEGATIVE   Bilirubin Urine NEGATIVE NEGATIVE   Ketones, ur NEGATIVE NEGATIVE mg/dL   Protein, ur NEGATIVE NEGATIVE mg/dL   Urobilinogen, UA 0.2 0.0 - 1.0 mg/dL   Nitrite NEGATIVE NEGATIVE   Leukocytes, UA NEGATIVE NEGATIVE  Basic metabolic panel  Result Value Ref Range   Sodium 140 135 - 145 mmol/L   Potassium 4.6 3.5 - 5.1 mmol/L   Chloride 107 101 - 111 mmol/L   CO2 21 (L) 22 - 32 mmol/L   Glucose, Bld 174 (H) 65 - 99 mg/dL   BUN 8 6 - 20 mg/dL   Creatinine, Ser 1.15 0.61 - 1.24 mg/dL  Calcium 8.8 (L) 8.9 - 10.3 mg/dL   GFR calc non Af Amer >60 >60 mL/min   GFR calc Af Amer >60 >60 mL/min   Anion gap 12 5 - 15  Blood gas, arterial  Result Value Ref Range   FIO2 0.21    Delivery systems ROOM AIR    pH, Arterial 7.299 (L) 7.350 - 7.450   pCO2 arterial 40.5 35.0 - 45.0 mmHg   pO2, Arterial 74.2 (L) 80.0 - 100.0 mmHg   Bicarbonate 19.2 (L) 20.0 - 24.0 mEq/L   Acid-base deficit 6.0 (H) 0.0 - 2.0 mmol/L   O2 Saturation 94.5 %   Patient temperature 98.6    Collection site RADIAL    Drawn by [id number]    Sample type ARTERIAL    Allens test (pass/fail) PASS PASS     Laboratory interpretation all normal except second chemistries still had normal and anion gap of 12. ABG shows patient is getting metabolic acidosis.     Diagnoses that have been ruled out:  None  Diagnoses that are still under  consideration:  None  Final diagnoses:  Ingestion of substance, initial encounter  Ethylene glycol poisoning, accidental or unintentional, initial encounter     Plan transfer to [facility] for admission   CRITICAL CARE Performed by: Rolland Porter L Total critical care time: 39 Critical care time was exclusive of separately billable procedures and treating other patients. Critical care was necessary to treat or prevent imminent or life-threatening deterioration. Critical care was time spent personally by me on the following activities: development of treatment plan with patient and/or surrogate as well as nursing, discussions with consultants, evaluation of patient's response to treatment, examination of patient, obtaining history from patient or surrogate, ordering and performing treatments and interventions, ordering and review of laboratory studies, ordering and review of radiographic studies, pulse oximetry and re-evaluation of patient's condition.   Rolland Porter, MD 07/27/15 [phone]

## 2015-07-27 NOTE — H&P (Addendum)
PULMONARY / CRITICAL CARE MEDICINE   Name: [name] MRN: [id number] DOB: 03/02/1961    ADMISSION DATE:  07/26/2015 CONSULTATION DATE:  10/9  REFERRING MD :  APER  CHIEF COMPLAINT:  Overdose   INITIAL PRESENTATION:  54 year old male w/ schizophrenia. Presented to ER at [facility] on 10/8 after ingesting antifreeze.  He stated he was walking in the woods and found a tequila bottle w/ green liquid that he thought was a  Data processing manager. He mixed this w/ his mountain dew and then said 1/2 way through realized "hey this isn't tequila; it's antifreeze!!" In ER ethylene glycol level was 89. Got fomepizole and was sent to [facility] for HD.   STUDIES:  Ethylene glycol 10/8: 89  SIGNIFICANT EVENTS:    HISTORY OF PRESENT ILLNESS:   54 y.o. male, with a significant PMhx, who presents to the Emergency Department on 10/8 reporting of a possible ingestion of antifreeze an hour prior.  He stated he was walking in the woods and found a tequila bottle w/ green liquid that he thought was a  Data processing manager. He mixed this w/ his mountain dew and then said 1/2 way through realized "hey this isn't tequila; it's antifreeze!!". He states he has consumed antifreeze 6 times in the past when he was treated at [facility] and is familiar with the taste. He did not bring the supposed bottle of antifreeze with him to the ED. Pt also reports consuming a tylenol 3, klonopin, and 1 beer approximately 7 hours ago. Denies urinating or vomiting since consumption. Presented to the ER at [facility]. Ethylene Glycol was sent to [facility] and was confirmed @ 89. He was given Fomepizole IV and transferred to [facility] for HD.    PAST MEDICAL HISTORY :   has a past medical history of Diabetes mellitus without complication ([place]) (9735); Hypertension (2008); Schizoaffective disorder ([place]) (2006 ); COPD (chronic obstructive pulmonary disease) ([place]); Alcohol dependence ([place]) (1979); Benzodiazepine dependence ([place]); Cocaine abuse; MI (myocardial infarction)  ([place]); and Cardiac arrest ([place]).  has no past surgical history on file. Prior to Admission medications   Medication Sig Start Date End Date Taking? Authorizing Provider  acetaminophen-codeine (TYLENOL #3) 300-30 MG per tablet Take 1 tablet by mouth every 8 (eight) hours as needed for moderate pain. 05/15/15  Yes Josalyn Funches, MD  amitriptyline (ELAVIL) 25 MG tablet Take 25 mg by mouth at bedtime.   Yes Historical Provider, MD  aspirin 81 MG chewable tablet Chew 81 mg by mouth daily.   Yes Historical Provider, MD  atorvastatin (LIPITOR) 40 MG tablet Take 1 tablet (40 mg total) by mouth daily. 11/10/14  Yes Josalyn Funches, MD  cetirizine (ZYRTEC) 10 MG tablet Take 1 tablet (10 mg total) by mouth daily. 03/28/15  Yes Josalyn Funches, MD  clonazePAM (KLONOPIN) 1 MG tablet Take 1 tablet (1 mg total) by mouth 3 (three) times daily. 05/15/15  Yes Josalyn Funches, MD  escitalopram (LEXAPRO) 20 MG tablet Take 1 tablet (20 mg total) by mouth daily. 11/07/14  Yes Josalyn Funches, MD  finasteride (PROSCAR) 5 MG tablet Take 1 tablet (5 mg total) by mouth daily. 02/06/15  Yes Josalyn Funches, MD  gabapentin (NEURONTIN) 800 MG tablet Take 800 mg by mouth 4 (four) times daily.   Yes Historical Provider, MD  insulin aspart (NOVOLOG FLEXPEN) 100 UNIT/ML FlexPen Inject 15 Units into the skin 3 (three) times daily with meals. 150-200= 17 units 201-300= 19 units 301-400= 23 units 401-500= 27 units >500= 30  units and call MD   Yes Historical Provider, MD  isosorbide mononitrate (IMDUR) 30 MG 24 hr tablet Take 30 mg by mouth daily.   Yes Historical Provider, MD  ketoconazole (NIZORAL) 2 % cream Apply 1 application topically 2 (two) times daily as needed for irritation. Patient taking differently: Apply 1 application topically 2 (two) times daily.  11/07/14  Yes Josalyn Funches, MD  lactulose (CHRONULAC) 10 GM/15ML solution Take 30 mLs (20 g total) by mouth daily as needed for mild constipation. 02/06/15  Yes Josalyn  Funches, MD  lisinopril (PRINIVIL,ZESTRIL) 10 MG tablet Take 10 mg by mouth daily.   Yes Historical Provider, MD  lubiprostone (AMITIZA) 24 MCG capsule Take 24 mcg by mouth every morning.    Yes Historical Provider, MD  metoprolol tartrate (LOPRESSOR) 25 MG tablet Take 1 tablet (25 mg total) by mouth 2 (two) times daily. 11/07/14  Yes Josalyn Funches, MD  NIFEdipine (ADALAT CC) 90 MG 24 hr tablet Take 90 mg by mouth daily.   Yes Historical Provider, MD  omeprazole (PRILOSEC) 20 MG capsule Take 20 mg by mouth daily.   Yes Historical Provider, MD  torsemide (DEMADEX) 20 MG tablet Take 80 mg by mouth daily.   Yes Historical Provider, MD  Vitamin D, Ergocalciferol, (DRISDOL) 50000 UNITS CAPS capsule Take 50,000 Units by mouth every 7 (seven) days.   Yes Historical Provider, MD  acetaminophen (TYLENOL 8 HOUR) 650 MG CR tablet Take 1 tablet (650 mg total) by mouth 2 (two) times daily. 05/15/15   Josalyn Funches, MD  albuterol (PROVENTIL HFA;VENTOLIN HFA) 108 (90 BASE) MCG/ACT inhaler Inhale 2 puffs into the lungs every 6 (six) hours as needed for wheezing or shortness of breath. 07/25/15   Josalyn Funches, MD  betamethasone dipropionate (DIPROLENE) 0.05 % ointment Apply topically 2 (two) times daily. On hands 05/02/15   Boykin Nearing, MD  Dextromethorphan-Guaifenesin (CORICIDIN HBP CONGESTION/COUGH) 10-200 MG CAPS Take 1-2 capsules by mouth 3 (three) times daily as needed (cough or congeston). 11/19/14   Josalyn Funches, MD  Fluticasone-Salmeterol (ADVAIR) 100-50 MCG/DOSE AEPB Inhale 1 puff into the lungs 2 (two) times daily. 07/25/15   Josalyn Funches, MD  glucose blood test strip 1 each by Other route 3 (three) times daily. ICD 10 E11.9 03/12/15   Boykin Nearing, MD  guaiFENesin (MUCINEX) 600 MG 12 hr tablet Take 1 tablet (600 mg total) by mouth 2 (two) times daily as needed for cough or to loosen phlegm. 05/02/15   Josalyn Funches, MD  Insulin Glargine (LANTUS SOLOSTAR) 100 UNIT/ML Solostar Pen Inject 40 Units  into the skin daily at 10 pm.    Historical Provider, MD  loratadine (CLARITIN) 10 MG tablet Take 10 mg by mouth daily.    Historical Provider, MD  mometasone (NASONEX) 50 MCG/ACT nasal spray 2 prays into each nostril daily 06/19/15   Boykin Nearing, MD  Paliperidone Palmitate 234 MG/1.5ML SUSP Inject 234 mg into the muscle every 28 (twenty-eight) days.    Historical Provider, MD  terbinafine (LAMISIL) 250 MG tablet Take 1 tablet (250 mg total) by mouth daily. 11/14/14   Boykin Nearing, MD   Allergies  Allergen Reactions  . Codeine Itching  . Haldol [Haloperidol Lactate] Other (See Comments)    Nightmares   . Seroquel [Quetiapine Fumarate] Other (See Comments)    Nightmares   . Trazodone And Nefazodone Other (See Comments)    Nightmares     FAMILY HISTORY:  has no family status information on file.  SOCIAL HISTORY:  reports  that he has been smoking Cigarettes.  He has been smoking about 1.50 packs per day. He does not have any smokeless tobacco history on file. He reports that he does not drink alcohol or use illicit drugs.  REVIEW OF SYSTEMS:  Per above   SUBJECTIVE:  No distress Thirsty C/o abd discomfort  VITAL SIGNS: Temp:  [97.4 F (36.3 C)-98.5 F (36.9 C)] 97.4 F (36.3 C) (10/09 0710) Pulse Rate:  [62-87] 64 (10/09 0932) Resp:  [12-20] 16 (10/09 0932) BP: (112-171)/(64-107) 143/64 mmHg (10/09 0932) SpO2:  [94 %-100 %] 100 % (10/09 0932) Weight:  [108.863 kg (240 lb)] 108.863 kg (240 lb) (10/09 0710) HEMODYNAMICS:   VENTILATOR SETTINGS:   INTAKE / OUTPUT:  Intake/Output Summary (Last 24 hours) at 07/27/15 1059 Last data filed at 07/27/15 0826  Gross per 24 hour  Intake      0 ml  Output   1600 ml  Net  -1600 ml    PHYSICAL EXAMINATION: General:  Chronically ill appearing white male, resting in bed, no acute distress.  Neuro:  Awake, oriented, impulsive  HEENT:  NCAT, poor dentition  Cardiovascular:  rrr Lungs:  Exp wheeze, occ rhonchi no accessory muscle  use  Abdomen:  Soft, not tender, + bowel sounds, no OM  Musculoskeletal:  Intact  Skin:  Trace LE edema, dry chronic flaky skin   LABS:  CBC  Recent Labs Lab 07/26/15 2050 07/26/15 2134  WBC 7.0  --   HGB 15.2 15.0  HCT 42.5 44.0  PLT 152  --    Coag's No results for input(s): APTT, INR in the last 168 hours. BMET  Recent Labs Lab 07/26/15 2050 07/26/15 2134 07/27/15 0324  NA 136 137 140  K 3.5 3.5 4.6  CL 102 99* 107  CO2 23  --  21*  BUN 8 6 8   CREATININE 1.07 1.10 1.15  GLUCOSE 128* 122* 174*   Electrolytes  Recent Labs Lab 07/26/15 2050 07/27/15 0324  CALCIUM 8.7* 8.8*   Sepsis Markers No results for input(s): LATICACIDVEN, PROCALCITON, O2SATVEN in the last 168 hours. ABG  Recent Labs Lab 07/27/15 0649  PHART 7.299*  PCO2ART 40.5  PO2ART 74.2*   Liver Enzymes  Recent Labs Lab 07/26/15 2050  AST 16  ALT 15*  ALKPHOS 54  BILITOT 0.6  ALBUMIN 4.0   Cardiac Enzymes No results for input(s): TROPONINI, PROBNP in the last 168 hours. Glucose No results for input(s): GLUCAP in the last 168 hours.  Imaging Dg Chest Port 1 View  07/26/2015   CLINICAL DATA:  54 year old who states that he may have ingested anti freeze (ethylene glycol) earlier today.  EXAM: PORTABLE CHEST 1 VIEW  COMPARISON:  06/03/2015 and earlier.  FINDINGS: Cardiac silhouette mildly enlarged for AP portable technique, unchanged. Lungs clear. Bronchovascular markings normal. Pulmonary vascularity normal. No visible pleural effusions. No pneumothorax.  IMPRESSION: Stable mild cardiomegaly.  No acute cardiopulmonary disease.   Electronically Signed   By: Evangeline Dakin M.D.   On: 07/26/2015 21:31     ASSESSMENT / PLAN: TOXICITY  A Ethylene glycol overdose  P: Repeat second dose of fomepizole Call nephrology will need HD Bicarb gtt IVFs Tele  Thiamine and folate  PULMONARY OETT A: COPD P:   Scheduled BDs Supplemental oxygen   CARDIOVASCULAR CVL A: CAD  HTN   P:  12 lead Tele MIVFs Hold antihypertensives for now   RENAL A:   See above P:   For HD   GASTROINTESTINAL A:  abd discomfort  P:   Supportive care Adv diet as tol   HEMATOLOGIC A:   No acute  P:  Trend cbc  [place] heparin   INFECTIOUS A:   No acute P:   Trend CBC and fever curve   ENDOCRINE A:   Dm w/ hyperglycemia  P:   ssi   NEUROLOGIC A:   Schizophrenia  Overdose   P:   RASS goal: 0 Supportive care    FAMILY  - Updates: pending   - Inter-disciplinary family meet or Palliative Care meeting due by:  10/16    TODAY'S SUMMARY:  Antifreeze overdose. Needs HD. Will place line and call nephrology.   Erick Colace ACNP-BC [place] Pager # [phone] OR # [phone] if no answer   07/27/2015, 10:59 AM   STAFF NOTE: I, Merrie Roof, MD FACP have personally reviewed patient's available data, including medical history, events of note, physical examination and test results as part of my evaluation. I have discussed with resident/NP and other care providers such as pharmacist, RN and RRT. In addition, I personally evaluated patient and elicited key findings of: alert, CTA, obese, mental status wnl as of now, hint of int prom on pcxr, EG level noted, unknown exact amount antifreeze, but likely 400 cc or more, high mortality without HD and ph noted, add bicarb for now, likley to dc, no role NIMV as no distress awake alert, add b1, b6 to shunt, after Emergent HD then repeat EG level, will need pscy evaluation, high risk shock, neurostatus changes, remain in icu, appreciate renal help, monitor for WD, benzo home dose redcution, monitor calcium closely / at risk seziures, fomepizole was given at [facility], continue this post HD as cleared through HD The patient is critically ill with multiple organ systems failure and requires high complexity decision making for assessment and support, frequent evaluation and titration of therapies, application  of advanced monitoring technologies and extensive interpretation of multiple databases.   Critical Care Time devoted to patient care services described in this note is30 Minutes. This time reflects time of care of this signee: Merrie Roof, MD FACP. This critical care time does not reflect procedure time, or teaching time or supervisory time of PA/NP/Med student/Med Resident etc but could involve care discussion time. Rest per NP/medical resident whose note is outlined above and that I agree with   Lavon Paganini. Titus Mould, MD, [place] Pgr: [facility] 07/27/2015 1:18 PM

## 2015-07-28 DIAGNOSIS — N179 Acute kidney failure, unspecified: Secondary | ICD-10-CM

## 2015-07-28 LAB — BASIC METABOLIC PANEL
ANION GAP: 10 (ref 5–15)
ANION GAP: 8 (ref 5–15)
ANION GAP: 11 (ref 5–15)
BUN: <5 mg/dL — ABNORMAL LOW (ref 6–20)
BUN: <5 mg/dL — ABNORMAL LOW (ref 6–20)
BUN: <5 mg/dL — ABNORMAL LOW (ref 6–20)
CALCIUM: 7.9 mg/dL — AB (ref 8.9–10.3)
CHLORIDE: 102 mmol/L (ref 101–111)
CHLORIDE: 100 mmol/L — AB (ref 101–111)
CHLORIDE: 101 mmol/L (ref 101–111)
CO2: 23 mmol/L (ref 22–32)
CO2: 31 mmol/L (ref 22–32)
CO2: 27 mmol/L (ref 22–32)
CREATININE: 0.73 mg/dL (ref 0.61–1.24)
Calcium: 7.8 mg/dL — ABNORMAL LOW (ref 8.9–10.3)
Calcium: 8 mg/dL — ABNORMAL LOW (ref 8.9–10.3)
Creatinine, Ser: 0.88 mg/dL (ref 0.61–1.24)
Creatinine, Ser: 0.98 mg/dL (ref 0.61–1.24)
GFR calc Af Amer: >60 mL/min (ref >60)
GFR calc non Af Amer: >60 mL/min (ref >60)
GFR calc non Af Amer: >60 mL/min (ref >60)
GFR calc non Af Amer: >60 mL/min (ref >60)
GLUCOSE: 144 mg/dL — AB (ref 65–99)
GLUCOSE: 135 mg/dL — AB (ref 65–99)
Glucose, Bld: 200 mg/dL — ABNORMAL HIGH (ref 65–99)
POTASSIUM: 3.5 mmol/L (ref 3.5–5.1)
POTASSIUM: 3.8 mmol/L (ref 3.5–5.1)
Potassium: 4.4 mmol/L (ref 3.5–5.1)
SODIUM: 139 mmol/L (ref 135–145)
Sodium: 135 mmol/L (ref 135–145)
Sodium: 139 mmol/L (ref 135–145)

## 2015-07-28 LAB — HEPATITIS PANEL, ACUTE
HCV Ab: <0.1 s/co ratio (ref 0.0–0.9)
HEP B C IGM: NEGATIVE
HEP B S AG: NEGATIVE
Hep A IgM: NEGATIVE

## 2015-07-28 LAB — CBC
HEMATOCRIT: 39.4 % (ref 39.0–52.0)
Hemoglobin: 13.6 g/dL (ref 13.0–17.0)
MCH: 32.7 pg (ref 26.0–34.0)
MCHC: 34.5 g/dL (ref 30.0–36.0)
MCV: 94.7 fL (ref 78.0–100.0)
Platelets: 127 10*3/uL — ABNORMAL LOW (ref 150–400)
RBC: 4.16 MIL/uL — ABNORMAL LOW (ref 4.22–5.81)
RDW: 13.3 % (ref 11.5–15.5)
WBC: 6.4 10*3/uL (ref 4.0–10.5)

## 2015-07-28 LAB — HEPATITIS B CORE ANTIBODY, TOTAL: Hep B Core Total Ab: NEGATIVE

## 2015-07-28 LAB — HEPATITIS B SURFACE ANTIBODY,QUALITATIVE: Hep B S Ab: NONREACTIVE

## 2015-07-28 LAB — MAGNESIUM: Magnesium: 1.7 mg/dL (ref 1.7–2.4)

## 2015-07-28 LAB — HEPATITIS B SURFACE ANTIGEN: Hepatitis B Surface Ag: NEGATIVE

## 2015-07-28 LAB — PHOSPHORUS: Phosphorus: 2.3 mg/dL — ABNORMAL LOW (ref 2.5–4.6)

## 2015-07-28 MED ORDER — LORAZEPAM 2 MG/ML IJ SOLN
1.0000 mg | Freq: Four times a day (QID) | INTRAMUSCULAR | Status: DC | PRN
  Administered 2015-07-28: 1 mg via INTRAVENOUS
  Filled 2015-07-28 (×2): qty 1

## 2015-07-28 MED ORDER — ADULT MULTIVITAMIN W/MINERALS CH
1.0000 | ORAL_TABLET | Freq: Every day | ORAL | Status: DC
  Administered 2015-07-28 – 2015-07-30 (×3): 1 via ORAL
  Filled 2015-07-28 (×3): qty 1

## 2015-07-28 MED ORDER — FOLIC ACID 1 MG PO TABS: 1.0000 mg | ORAL_TABLET | Freq: Every day | ORAL | Status: DC

## 2015-07-28 MED ORDER — THIAMINE HCL 100 MG/ML IJ SOLN: 100.0000 mg | Freq: Every day | INTRAMUSCULAR | Status: DC

## 2015-07-28 MED ORDER — OXYCODONE-ACETAMINOPHEN 5-325 MG PO TABS
1.0000 | ORAL_TABLET | Freq: Four times a day (QID) | ORAL | Status: DC | PRN
  Administered 2015-07-28 – 2015-07-30 (×6): 1 via ORAL
  Filled 2015-07-28 (×6): qty 1

## 2015-07-28 MED ORDER — VITAMIN B-1 100 MG PO TABS
100.0000 mg | ORAL_TABLET | Freq: Every day | ORAL | Status: DC
  Administered 2015-07-28 – 2015-07-30 (×3): 100 mg via ORAL
  Filled 2015-07-28 (×3): qty 1

## 2015-07-28 MED ORDER — LORAZEPAM 1 MG PO TABS
1.0000 mg | ORAL_TABLET | Freq: Four times a day (QID) | ORAL | Status: DC | PRN
  Administered 2015-07-29 – 2015-07-30 (×2): 1 mg via ORAL
  Filled 2015-07-28 (×3): qty 1

## 2015-07-28 NOTE — Progress Notes (Signed)
PULMONARY / CRITICAL CARE MEDICINE   Name: [name] MRN: [id number] DOB: [birth year]    ADMISSION DATE:  07/26/2015 CONSULTATION DATE:  07/27/15  REFERRING MD :  AP EDP  CHIEF COMPLAINT:  overdose  INITIAL PRESENTATION: 54 year old male w/ schizophrenia. Presented to ER at [facility] on 10/8 after ingesting antifreeze. He stated he was walking in the woods and found a tequila bottle w/ green liquid that he thought was a Data processing manager. He mixed this w/ his mountain dew and then said 1/2 way through realized "hey this isn't tequila; it's antifreeze!!" In ER ethylene glycol level was 89. Got fomepizole and was sent to [facility] for HD.   STUDIES:  Ethylene glycol 10/8: 89  SIGNIFICANT EVENTS: 10/9 - HD  SUBJECTIVE: Reports chronic LBP, otherwise alright  VITAL SIGNS: Temp:  [97.6 F (36.4 C)-98.7 F (37.1 C)] 98.5 F (36.9 C) (10/10 0349) Pulse Rate:  [58-98] 72 (10/10 0600) Resp:  [8-23] 14 (10/10 0600) BP: (101-191)/(62-139) 148/80 mmHg (10/10 0600) SpO2:  [91 %-100 %] 93 % (10/10 0600) Weight:  [228 lb 2.8 oz (103.5 kg)-231 lb 7.7 oz (105 kg)] 231 lb 7.7 oz (105 kg) (10/10 0500) HEMODYNAMICS:   VENTILATOR SETTINGS:   INTAKE / OUTPUT:  Intake/Output Summary (Last 24 hours) at 07/28/15 0803 Last data filed at 07/28/15 7408  Gross per 24 hour  Intake   2013 ml  Output   3525 ml  Net  -1512 ml    PHYSICAL EXAMINATION: General: Chronically ill appearing male, resting in bed, no acute distress. Neuro: Awake, oriented  HEENT: NCAT, poor dentition  Cardiovascular: rrr, no m/r/g Lungs: Exp wheeze, no accessory muscle use  Abdomen: Soft, NTND, + bowel sounds Musculoskeletal: Intact  Skin: Trace LE edema, dry chronic flaky skin   LABS:  CBC  Recent Labs Lab 07/26/15 2050 07/26/15 2134 07/27/15 1313 07/28/15 0305  WBC 7.0  --  5.5 6.4  HGB 15.2 15.0 14.5 13.6  HCT 42.5 44.0 42.5 39.4  PLT 152  --  140* 127*   Coag's No results for input(s): APTT, INR in  the last 168 hours. BMET  Recent Labs Lab 07/27/15 1313 07/27/15 2317 07/28/15 0305  NA 137 135 139  K 5.0 4.4 3.5  CL 107 102 100*  CO2 22 23 31   BUN 7 <5* <5*  CREATININE 0.98 0.73 0.88  GLUCOSE 137* 144* 135*   Electrolytes  Recent Labs Lab 07/27/15 1313 07/27/15 2317 07/28/15 0305  CALCIUM 8.9 7.8* 7.9*  MG  --   --  1.7  PHOS 4.1  --  2.3*   Sepsis Markers No results for input(s): LATICACIDVEN, PROCALCITON, O2SATVEN in the last 168 hours. ABG  Recent Labs Lab 07/27/15 0649  PHART 7.299*  PCO2ART 40.5  PO2ART 74.2*   Liver Enzymes  Recent Labs Lab 07/26/15 2050 07/27/15 1313  AST 16 16  ALT 15* 15*  ALKPHOS 54 65  BILITOT 0.6 0.6  ALBUMIN 4.0 3.7   Cardiac Enzymes No results for input(s): TROPONINI, PROBNP in the last 168 hours. Glucose  Recent Labs Lab 07/27/15 1105  GLUCAP 144*    Imaging Dg Chest Port 1 View  07/27/2015   CLINICAL DATA:  Central line placement.  EXAM: PORTABLE CHEST 1 VIEW  COMPARISON:  July 26, 2015.  FINDINGS: Stable cardiomegaly. No pneumothorax or pleural effusion is noted. Interval placement of left internal jugular catheter with distal tip at expected position of the cavoatrial junction. No acute pulmonary disease is noted. Bony  thorax is unremarkable.  IMPRESSION: Interval placement of left internal jugular catheter with distal tip at expected position of cavoatrial junction. No pneumothorax is noted.   Electronically Signed   By: Marijo Conception, M.D.   On: 07/27/2015 12:40     ASSESSMENT / PLAN: TOXICITY  A Ethylene glycol overdose - AG closed, level downtrending  P: S/p 3 doses of fomepizole Nephrology following for HD D/c Bicarb gtt IVFs Tele  Thiamine and folate  F/u repeat ethylene glycol level  PULMONARY A: COPD P:  Scheduled BDs Supplemental oxygen prn   CARDIOVASCULAR CVL A: CAD  HTN  P:  Tele MIVFs Hold antihypertensives for now   RENAL A:  See above P:  S/p HD  10/9  GASTROINTESTINAL A:  abd discomfort - resolved P:  Supportive care Adv diet as tol   HEMATOLOGIC A:  Thrombocytopenia - mild, likely related to EtOH abuse P:  Trend cbc  [place] heparin   INFECTIOUS A:  No acute issues P:  Trend CBC and fever curve   ENDOCRINE A:  Dm w/ hyperglycemia  P:  ssi   NEUROLOGIC A:  Schizophrenia  Overdose  P:  RASS goal: 0 Supportive care  Psych consult today - will likely need d/c to psych hospital Restart home lexapro, klonopin Pharmacy to med rec and determine last dose of paliperidone   FAMILY  - Updates: pending  - Inter-disciplinary family meet or Palliative Care meeting due by: 10/16   Virginia Crews, MD, MPH PGY-2,  [place] [facility] 07/28/2015 8:03 AM Pager: [phone]

## 2015-07-28 NOTE — Progress Notes (Signed)
Admission note:  Arrival Method: wheelchair Mental Orientation: alert & oriented x 4  Telemetry: box #8 applied and CCMD notified  Assessment: completed Skin: flaky dry red patches covering skin in generalized fashion IV: left IJ HD catheter triple lumen catheter Pain: pt states "pain is really bad in my neck and head" Tubes: see above Safety Measures: Patient Handbook has been given, and discussed the Fall Prevention worksheet. Left at bedside  Admission: Completed and admission orders have been written  6E Orientation: Patient has been oriented to the unit, staff and to the room.     Berle Fitz [employer], RN [employer] Phone [phone]

## 2015-07-28 NOTE — Progress Notes (Signed)
Patient ID: [name], male   DOB: 25-Jul-1961, 54 y.o.   MRN: [id number] S:feels better O:BP 148/80 mmHg  Pulse 72  Temp(Src) 98.5 F (36.9 C) (Oral)  Resp 14  Ht 5\' 11"  (1.803 m)  Wt 105 kg (231 lb 7.7 oz)  BMI 32.30 kg/m2  SpO2 93%  Intake/Output Summary (Last 24 hours) at 07/28/15 0826 Last data filed at 07/28/15 0657  Gross per 24 hour  Intake   2013 ml  Output   2925 ml  Net   -912 ml   Intake/Output: I/O last 3 completed shifts: In: 2013 [P.O.:300; I.V.:1410; IV Piggyback:303] Out: 4525 [Urine:4575]  Intake/Output this shift:    Weight change: -5.363 kg (-11 lb 13.2 oz) Gen:WD WN WM in NAd CVS:no rub Resp:occ rhonchi bilaterally Abd:+BS, soft Ext:no edema   Recent Labs Lab 07/26/15 2050 07/26/15 2134 07/27/15 0324 07/27/15 1313 07/27/15 2317 07/28/15 0305  NA 136 137 140 137 135 139  K 3.5 3.5 4.6 5.0 4.4 3.5  CL 102 99* 107 107 102 100*  CO2 23  --  21* 22 23 31   GLUCOSE 128* 122* 174* 137* 144* 135*  BUN 8 6 8 7  <5* <5*  CREATININE 1.07 1.10 1.15 0.98 0.73 0.88  ALBUMIN 4.0  --   --  3.7  --   --   CALCIUM 8.7*  --  8.8* 8.9 7.8* 7.9*  PHOS  --   --   --  4.1  --  2.3*  AST 16  --   --  16  --   --   ALT 15*  --   --  15*  --   --    Liver Function Tests:  Recent Labs Lab 07/26/15 2050 07/27/15 1313  AST 16 16  ALT 15* 15*  ALKPHOS 54 65  BILITOT 0.6 0.6  PROT 6.7 6.2*  ALBUMIN 4.0 3.7    Recent Labs Lab 07/26/15 2050 07/27/15 1313  LIPASE 41 33   No results for input(s): AMMONIA in the last 168 hours. CBC:  Recent Labs Lab 07/26/15 2050 07/26/15 2134 07/27/15 1313 07/28/15 0305  WBC 7.0  --  5.5 6.4  NEUTROABS 3.6  --   --   --   HGB 15.2 15.0 14.5 13.6  HCT 42.5 44.0 42.5 39.4  MCV 94.2  --  95.7 94.7  PLT 152  --  140* 127*   Cardiac Enzymes: No results for input(s): CKTOTAL, CKMB, CKMBINDEX, TROPONINI in the last 168 hours. CBG:  Recent Labs Lab 07/27/15 1105  GLUCAP 144*    Iron Studies: No results for  input(s): IRON, TIBC, TRANSFERRIN, FERRITIN in the last 72 hours. Studies/Results: Dg Chest Port 1 View  07/27/2015   CLINICAL DATA:  Central line placement.  EXAM: PORTABLE CHEST 1 VIEW  COMPARISON:  July 26, 2015.  FINDINGS: Stable cardiomegaly. No pneumothorax or pleural effusion is noted. Interval placement of left internal jugular catheter with distal tip at expected position of the cavoatrial junction. No acute pulmonary disease is noted. Bony thorax is unremarkable.  IMPRESSION: Interval placement of left internal jugular catheter with distal tip at expected position of cavoatrial junction. No pneumothorax is noted.   Electronically Signed   By: Marijo Conception, M.D.   On: 07/27/2015 12:40   Dg Chest Port 1 View  07/26/2015   CLINICAL DATA:  54 year old who states that he may have ingested anti freeze (ethylene glycol) earlier today.  EXAM: PORTABLE CHEST 1 VIEW  COMPARISON:  06/03/2015 and earlier.  FINDINGS: Cardiac silhouette mildly enlarged for AP portable technique, unchanged. Lungs clear. Bronchovascular markings normal. Pulmonary vascularity normal. No visible pleural effusions. No pneumothorax.  IMPRESSION: Stable mild cardiomegaly.  No acute cardiopulmonary disease.   Electronically Signed   By: Evangeline Dakin M.D.   On: 07/26/2015 21:31   . arformoterol  15 mcg Nebulization Q12H  . budesonide (PULMICORT) nebulizer solution  0.25 mg Nebulization Q12H  . finasteride  5 mg Oral Daily  . folic acid  1 mg Intravenous Daily  . fomepizole (ANTIZOL) IV  10 mg/kg Intravenous Q12H  . heparin  5,000 Units Subcutaneous 3 times per day  . Influenza vac split quadrivalent PF  0.5 mL Intramuscular Tomorrow-1000  . pantoprazole (PROTONIX) IV  40 mg Intravenous Q12H  . pneumococcal 23 valent vaccine  0.5 mL Intramuscular Tomorrow-1000  . pyridOXINE  100 mg Intravenous Daily  . thiamine IV  100 mg Intravenous Daily    BMET    Component Value Date/Time   NA 139 07/28/2015 0305   K 3.5  07/28/2015 0305   CL 100* 07/28/2015 0305   CO2 31 07/28/2015 0305   GLUCOSE 135* 07/28/2015 0305   BUN <5* 07/28/2015 0305   CREATININE 0.88 07/28/2015 0305   CREATININE 1.19 02/06/2015 1712   CALCIUM 7.9* 07/28/2015 0305   GFRNONAA >60 07/28/2015 0305   GFRNONAA 69 02/06/2015 1712   GFRAA >60 07/28/2015 0305   GFRAA 80 02/06/2015 1712   CBC    Component Value Date/Time   WBC 6.4 07/28/2015 0305   RBC 4.16* 07/28/2015 0305   HGB 13.6 07/28/2015 0305   HCT 39.4 07/28/2015 0305   PLT 127* 07/28/2015 0305   MCV 94.7 07/28/2015 0305   MCH 32.7 07/28/2015 0305   MCHC 34.5 07/28/2015 0305   RDW 13.3 07/28/2015 0305   LYMPHSABS 2.4 07/26/2015 2050   MONOABS 0.6 07/26/2015 2050   EOSABS 0.3 07/26/2015 2050   BASOSABS 0.1 07/26/2015 2050     Assessment/Plan:  1. 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  Good urine output and hemodynamically stable. Normal anion gap.  Hold off on another session of hemodialysis until repeat ethylene glycol level is back from this morning but  I suspect he will not need further HD. 2. Suicide attempt- plan per psych 3. HTN- ok to resume metoprolol, hold lisinopril and diuretics for now. 4. Schizophrenia- per Psych  Aureliano Oshields A

## 2015-07-28 NOTE — Progress Notes (Signed)
This RN called to [facility] regarding ethylene glycol test that has not resulted. The lab center where this test is processed has not received the tube into their department yet. May still be in transit. Level was drawn at 3:30 am. The test was not ordered as STAT, therefore their turnaround time is up to 3 days. They stated that if ordered STAT, still may not result today.   Number to [facility] is [phone] with account number [id number]  Will pass on for new RN on 6E to call back and follow up to be sure the level has been received.

## 2015-07-28 NOTE — Progress Notes (Signed)
Attempted report x1. Number given to RN, will call back.

## 2015-07-28 NOTE — Progress Notes (Signed)
eLink Physician-Brief Progress Note Patient Name: [name] DOB: 06-30-1961 MRN: [id number]   Date of Service  07/28/2015  HPI/Events of Note  In pain and feels warm after drinking anti-freeze.  eICU Interventions  Percocet.     Intervention Category Major Interventions: Acid-Base disturbance - evaluation and management;Other:  YACOUB,WESAM 07/28/2015, 9:23 PM

## 2015-07-29 DIAGNOSIS — T1491 Suicide attempt: Principal | ICD-10-CM

## 2015-07-29 DIAGNOSIS — T528X2A Toxic effect of other organic solvents, intentional self-harm, initial encounter: Secondary | ICD-10-CM

## 2015-07-29 LAB — RENAL FUNCTION PANEL
ALBUMIN: 3.4 g/dL — AB (ref 3.5–5.0)
ANION GAP: 8 (ref 5–15)
BUN: <5 mg/dL — ABNORMAL LOW (ref 6–20)
CO2: 29 mmol/L (ref 22–32)
Calcium: 8.3 mg/dL — ABNORMAL LOW (ref 8.9–10.3)
Chloride: 100 mmol/L — ABNORMAL LOW (ref 101–111)
Creatinine, Ser: 1 mg/dL (ref 0.61–1.24)
Glucose, Bld: 200 mg/dL — ABNORMAL HIGH (ref 65–99)
PHOSPHORUS: 3.7 mg/dL (ref 2.5–4.6)
POTASSIUM: 3.6 mmol/L (ref 3.5–5.1)
Sodium: 137 mmol/L (ref 135–145)

## 2015-07-29 LAB — BASIC METABOLIC PANEL
ANION GAP: 8 (ref 5–15)
Anion gap: 9 (ref 5–15)
BUN: <5 mg/dL — ABNORMAL LOW (ref 6–20)
BUN: <5 mg/dL — ABNORMAL LOW (ref 6–20)
CHLORIDE: 98 mmol/L — AB (ref 101–111)
CHLORIDE: 100 mmol/L — AB (ref 101–111)
CO2: 27 mmol/L (ref 22–32)
CO2: 28 mmol/L (ref 22–32)
Calcium: 8.3 mg/dL — ABNORMAL LOW (ref 8.9–10.3)
Calcium: 8.4 mg/dL — ABNORMAL LOW (ref 8.9–10.3)
Creatinine, Ser: 1.01 mg/dL (ref 0.61–1.24)
Creatinine, Ser: 1.05 mg/dL (ref 0.61–1.24)
GFR calc non Af Amer: >60 mL/min (ref >60)
Glucose, Bld: 186 mg/dL — ABNORMAL HIGH (ref 65–99)
Glucose, Bld: 225 mg/dL — ABNORMAL HIGH (ref 65–99)
POTASSIUM: 3.4 mmol/L — AB (ref 3.5–5.1)
Potassium: 4.3 mmol/L (ref 3.5–5.1)
SODIUM: 134 mmol/L — AB (ref 135–145)
Sodium: 136 mmol/L (ref 135–145)

## 2015-07-29 LAB — CBC
HEMATOCRIT: 39.6 % (ref 39.0–52.0)
HEMOGLOBIN: 13.7 g/dL (ref 13.0–17.0)
MCH: 32.6 pg (ref 26.0–34.0)
MCHC: 34.6 g/dL (ref 30.0–36.0)
MCV: 94.3 fL (ref 78.0–100.0)
Platelets: 130 10*3/uL — ABNORMAL LOW (ref 150–400)
RBC: 4.2 MIL/uL — ABNORMAL LOW (ref 4.22–5.81)
RDW: 13.2 % (ref 11.5–15.5)
WBC: 5.2 10*3/uL (ref 4.0–10.5)

## 2015-07-29 LAB — ETHYLENE GLYCOL
ETHYLENE GLYCOL LVL: 6 mg/dL — AB
ETHYLENE GLYCOL LVL: NOT DETECTED mg/dL

## 2015-07-29 LAB — GLUCOSE, CAPILLARY
GLUCOSE-CAPILLARY: 185 mg/dL — AB (ref 65–99)
Glucose-Capillary: 169 mg/dL — ABNORMAL HIGH (ref 65–99)

## 2015-07-29 MED ORDER — INSULIN GLARGINE 100 UNIT/ML ~~LOC~~ SOLN
20.0000 [IU] | Freq: Every day | SUBCUTANEOUS | Status: DC
  Administered 2015-07-29: 20 [IU] via SUBCUTANEOUS
  Filled 2015-07-29 (×2): qty 0.2

## 2015-07-29 MED ORDER — PANTOPRAZOLE SODIUM 40 MG PO TBEC
40.0000 mg | DELAYED_RELEASE_TABLET | Freq: Every day | ORAL | Status: DC
  Administered 2015-07-29: 40 mg via ORAL
  Filled 2015-07-29: qty 1

## 2015-07-29 MED ORDER — CLONAZEPAM 0.5 MG PO TABS
0.5000 mg | ORAL_TABLET | Freq: Three times a day (TID) | ORAL | Status: DC
  Administered 2015-07-29 – 2015-07-30 (×3): 0.5 mg via ORAL
  Filled 2015-07-29 (×3): qty 1

## 2015-07-29 MED ORDER — HYDRALAZINE HCL 20 MG/ML IJ SOLN
5.0000 mg | Freq: Once | INTRAMUSCULAR | Status: DC
  Filled 2015-07-29: qty 1

## 2015-07-29 MED ORDER — HYDRALAZINE HCL 20 MG/ML IJ SOLN
10.0000 mg | Freq: Four times a day (QID) | INTRAMUSCULAR | Status: DC | PRN
  Administered 2015-07-29: 10 mg via INTRAVENOUS
  Filled 2015-07-29: qty 1

## 2015-07-29 MED ORDER — ESCITALOPRAM OXALATE 20 MG PO TABS
20.0000 mg | ORAL_TABLET | Freq: Every day | ORAL | Status: DC
  Administered 2015-07-29 – 2015-07-30 (×2): 20 mg via ORAL
  Filled 2015-07-29 (×2): qty 1

## 2015-07-29 MED ORDER — INSULIN ASPART 100 UNIT/ML ~~LOC~~ SOLN
0.0000 [IU] | Freq: Three times a day (TID) | SUBCUTANEOUS | Status: DC
  Administered 2015-07-29 – 2015-07-30 (×2): 2 [IU] via SUBCUTANEOUS

## 2015-07-29 MED ORDER — METOPROLOL TARTRATE 25 MG PO TABS
25.0000 mg | ORAL_TABLET | Freq: Two times a day (BID) | ORAL | Status: DC
  Administered 2015-07-29 – 2015-07-30 (×3): 25 mg via ORAL
  Filled 2015-07-29 (×3): qty 1

## 2015-07-29 NOTE — Clinical Social Work Psych Assess (Signed)
Clinical Social Work Nature conservation officer  Clinical Social Worker:  Dulcy Fanny, [place] Date/Time:  07/29/2015, 12:20 PM Referred By:  Physician Date Referred:  07/29/15 Reason for Referral:   (ingestion of anti-freeze)  Presenting Symptoms/Problems Patient called EMS status post anti-freeze ingestion  Abuse/Neglect/Trauma History Denies History  Psychiatric History Inpatient/Hospitalization, Outpatient Treatment  Inpatient: 2003, 2005, 2008, 2010 Inpatient behavioral health admissions status post antifreeze ingestion Outpatient: patient is currently being followed by [facility] team: Marinus Maw [phone]  Emotional Health/Current Symptoms  (denies)  Psychotic/Dissociative Symptoms None Reported Other Psychotic/Dissociative Symptoms:  Paranoia (baseline)  Attention/Behavioral Symptoms Restless  Cognitive Impairment Within Normal Limits  Mood and Adjustment Anxious, Paranoid  Stress, Anxiety, Trauma, Any Recent Loss/Stressor Anxiety (chronic back pain; baseline-paranioa) Anxiety (frequency):  Daily: Group Home owner reports patient trigger is being without nicotine.  Of report, the patient was instructed that he had to "cut back" on his cigarette smoking.  The patient was cut back to 5 per day and is given to the patient on a timed interval.  Per report, the patient often exhibits agitation and verbal aggression when he is "having a nicotine fit".  Group Home director states that once the patient has a cigarette, he apologizes for the outbursts and is remorseful.  During these "fits" the patient is not easily redirected and often calls 911 or the [place] or Marinus Maw (his ACTT team lead).    Other Stress, Anxiety, Trauma, Any Recent Loss/Stressor:  Cigarettes being rationed and chronic back pain   Substance Abuse/Use History of Substance Use (ACTT team states patient seeks out MDs (numerous) to provide additional monthly narcotics)  ACTT team lead states patient neither  participates in illicit drugs nor consumes alcohol SBIRT Completed (please refer for detailed history): No Self-reported Substance Use (last use and frequency):  Patient reports prior to arrival- drinking a beer  Urinary Drug Screen Completed: Yes  Environment/Housing/Living Arrangement  (Group Home: [facility] owner, [phone])  Emergency Contact:  Brandon Melnick [phone]  Financial Medicaid, Medicare Disability income: Marinus Maw Consulting civil engineer ACTT) is patient's payee  Patient's Strengths and Goals Patient has supportive ACTT team and stable housing (group home).  Patient is being followed in the community and managed well to remain out of the hospital setting for the last 6 years.  Patient has stable income and transportation via group home/ACTT team.   Clinical Social Worker's Interpretive Summary  Clinical Social Workers Interpretive Summary:  Psych CSW was consulted regarding collateral information for patient: group home and [facility] ACTT team lead, Licensed conveyancer.  Psych CSW spoke with Marinus Maw who states he has been assisting the patient with ACTT services for approximately the past 5 years.  Marinus Maw states the patient has no mental health admissions since he has been assisting the patient.    ACTT team reports that patient has narcotic seeking behaviors which stem from chronic back pain due to DDD.  Patient denies this was a suicide attempt.  Patient denies current SI/HI.  Patient denies current AVHD.  Patient exhibits moderate anxiety and baseline paranoia.  Group home states patient often sits on the front porch and listens to the radio and is very talkative and pleasant.  Group home states the patient has been a resident there for the past month.  Prior to this placement, patient was at a boarding home where he was evicted (per [employer] team).  The eviction was due to the patient's non-compliance with rules of hygiene, visitors and smoking.  Group home and  ACTT team lead is  unaware of how patient would have gotten anti-freeze.  Both state there is a county store across the street from the group home, but the ACTT team lead/payee reports patient was out of money with no means to purchase anti-freeze.  The group home director states there are very limited areas of "woods" around the group home and none of the areas would have anti-freeze lying around.   Group home states the patient is able to return and will provide transportation at time of discharge.   Disposition  Disposition: Outpatient Referral Made/Needed (ACTT team to follow) Return to [place]

## 2015-07-29 NOTE — Progress Notes (Signed)
Spoke with Audrea Muscat from [facility] in regards to ethenylene glycol result. Per Audrea Muscat the specimen did get to the facility; the specimen was not tested d/t cancellation. Dr. Marval Regal made aware. Level has been redrawn and per Audrea Muscat should result in the next 12 hours; she states "this is a 12 hour test."

## 2015-07-29 NOTE — Progress Notes (Signed)
[facility] Note  Ethylene glycol from 07/29/15 - none detected. No additional dialysis indicated at this time  Jamal Maes, MD [place] Pager 07/29/2015, 7:19 PM

## 2015-07-29 NOTE — Consult Note (Signed)
[facility] Face-to-Face Psychiatry Consult   Reason for Consult:  Intentional overdose Referring Physician:  Dr. Allyson Sabal Patient Identification: [name] MRN:  [id number] Principal Diagnosis: Ethylene glycol poisoning Diagnosis:   Patient Active Problem List   Diagnosis Date Noted  . AKI (acute kidney injury) ([place]) [N17.9]   . Overdose [T50.901A] 07/27/2015  . Encounter for central line placement [Z45.2]   . 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 Chest pain [R07.9] 05/26/2015  . Healthcare maintenance [Z00.00] 02/10/2015  . Constipation [K59.00] 02/06/2015  . BPH (benign prostatic hyperplasia) [N40.0] 01/16/2015  . Bilateral leg edema [R60.0] 01/16/2015  . Vitamin D deficiency [E55.9] 01/16/2015  . Allergic rhinitis [J30.9] 11/19/2014  . Chronic low back pain [M54.5, G89.29] 11/07/2014  . Onychomycosis of toenail [B35.1] 11/07/2014  . Tinea pedis [B35.3] 11/07/2014  . Alcohol abuse [F10.10] 10/17/2014  . Psoriasis [L40.9] 10/14/2014  . HTN (hypertension) [I10] 10/14/2014  . Diabetes mellitus type 2, controlled ([place]) [E11.9] 10/14/2014  . COPD (chronic obstructive pulmonary disease) ([place]) [J44.9] 10/14/2014  . Tobacco abuse [Z72.0] 10/14/2014  . HLD (hyperlipidemia) [E78.5] 10/14/2014  . Schizoaffective disorder ([place]) [F25.9] 10/14/2014    Total Time spent with patient: 1 hour  Subjective:   [name] is a 54 y.o. male patient admitted with intentional overdose.  HPI:  [name] is a 54 y.o. Male with a significant history of chronic, recurrent schizoaffective disorder, polysubstance abuse and drug-seeking behavior admitted to [facility] from [facility] status post intentional ingestion of ethylene glycol. Patient stated that he called himself for help after ingestion. Patient required hemodialysis and now considered to be medically stable to be discharged. Psychiatric consultation requested for safety and evaluation of depression, anxiety and  psychosis. Patient has ongoing paranoid delusional thoughts but no suicidal/homicidal ideation intention or plans. Patient is known to have drug-seeking behavior especially opiates and according while in the group home. Patient mental [facility] disease and he does not get money to buy on his own as per his act members and group home reported. Reportedly there are no woods near the group home and the staff over there not able to the identify how he ended up accessing ethylene glycol. Patient has history of ethylene glycol injection at least 6 times in the past and last admission to the hospitalist 2008. Patient stated that he does not want to be hospitalized or incarcerated he likes to go back to his community stay safe in his group home follow-up with mental health care. Patient contract for safety during this evaluation.  Past Psychiatric History: Patient has been suffering with schizoaffective disorder, history of polysubstance abuse including alcohol, nicotine and pain medication. Patient has been followed with [facility] for mental health and also has ACT team.  Risk to Self: Is patient at risk for suicide?: No Risk to Others:   Prior Inpatient Therapy:   Prior Outpatient Therapy:    Past Medical History:  Past Medical History  Diagnosis Date  . Diabetes mellitus without complication ([place]) 7673  . Hypertension 2008  . Schizoaffective disorder ([place]) 2006   . COPD (chronic obstructive pulmonary disease) ([place])   . Alcohol dependence ([place]) 1979    stated abusing ETOH at age 8   . Benzodiazepine dependence ([place])   . Cocaine abuse   . MI (myocardial infarction) ([place])   . Cardiac arrest [facility])    History reviewed. No pertinent past surgical history. Family History:  Family History  Problem Relation Age of Onset  .  Heart disease Mother   . Heart disease Father   . Heart disease Maternal Grandmother   . Diabetes Maternal Grandmother    Family Psychiatric  History: Patient  does not want talk about his family history of mental illness and reportedly spoke with his mother recently.  Social History:  History  Alcohol Use No     History  Drug Use No    Social History   Social History  . Marital Status: Single    Spouse Name: N/A  . Number of Children: N/A  . Years of Education: N/A   Social History Main Topics  . Smoking status: Heavy Tobacco Smoker -- 1.50 packs/day    Types: Cigarettes  . Smokeless tobacco: None  . Alcohol Use: No  . Drug Use: No  . Sexual Activity: No   Other Topics Concern  . None   Social History Narrative   Additional Social History: Patient is a resident of a local group home in [place] area and follow with activity members. Patient is disabled more than 5 years from now. Patient has significant history of alcohol abuse versus dependence and chronic mental illness. Patient also reportedly incarcerated several years ago.                           Allergies:   Allergies  Allergen Reactions  . Codeine Itching  . Haldol [Haloperidol Lactate] Other (See Comments)    Nightmares   . Seroquel [Quetiapine Fumarate] Other (See Comments)    Nightmares   . Trazodone And Nefazodone Other (See Comments)    Nightmares     Labs:  Results for orders placed or performed during the hospital encounter of 07/26/15 (from the past 48 hour(s))  Glucose, capillary     Status: Abnormal   Collection Time: 07/27/15 11:05 AM  Result Value Ref Range   Glucose-Capillary 144 (H) 65 - 99 mg/dL  MRSA PCR Screening     Status: None   Collection Time: 07/27/15 12:05 PM  Result Value Ref Range   MRSA by PCR NEGATIVE NEGATIVE    Comment:        The GeneXpert MRSA Assay (FDA approved for NASAL specimens only), is one component of a comprehensive MRSA colonization surveillance program. It is not intended to diagnose MRSA infection nor to guide or monitor treatment for MRSA infections.   Comprehensive metabolic panel      Status: Abnormal   Collection Time: 07/27/15  1:13 PM  Result Value Ref Range   Sodium 137 135 - 145 mmol/L   Potassium 5.0 3.5 - 5.1 mmol/L   Chloride 107 101 - 111 mmol/L   CO2 22 22 - 32 mmol/L   Glucose, Bld 137 (H) 65 - 99 mg/dL   BUN 7 6 - 20 mg/dL   Creatinine, Ser 0.98 0.61 - 1.24 mg/dL   Calcium 8.9 8.9 - 10.3 mg/dL   Total Protein 6.2 (L) 6.5 - 8.1 g/dL   Albumin 3.7 3.5 - 5.0 g/dL   AST 16 15 - 41 U/L   ALT 15 (L) 17 - 63 U/L   Alkaline Phosphatase 65 38 - 126 U/L   Total Bilirubin 0.6 0.3 - 1.2 mg/dL   GFR calc non Af Amer >60 >60 mL/min   GFR calc Af Amer >60 >60 mL/min    Comment: (NOTE) The eGFR has been calculated using the CKD EPI equation. This calculation has not been validated in all clinical situations. eGFR's persistently <  60 mL/min signify possible Chronic Kidney Disease.    Anion gap 8 5 - 15  CBC     Status: Abnormal   Collection Time: 07/27/15  1:13 PM  Result Value Ref Range   WBC 5.5 4.0 - 10.5 K/uL   RBC 4.44 4.22 - 5.81 MIL/uL   Hemoglobin 14.5 13.0 - 17.0 g/dL   HCT 42.5 39.0 - 52.0 %   MCV 95.7 78.0 - 100.0 fL   MCH 32.7 26.0 - 34.0 pg   MCHC 34.1 30.0 - 36.0 g/dL   RDW 13.4 11.5 - 15.5 %   Platelets 140 (L) 150 - 400 K/uL  Lipase, blood     Status: None   Collection Time: 07/27/15  1:13 PM  Result Value Ref Range   Lipase 33 22 - 51 U/L  Phosphorus     Status: None   Collection Time: 07/27/15  1:13 PM  Result Value Ref Range   Phosphorus 4.1 2.5 - 4.6 mg/dL  Hepatitis panel, acute     Status: None   Collection Time: 07/27/15  1:13 PM  Result Value Ref Range   Hepatitis B Surface Ag Negative Negative   HCV Ab <0.1 0.0 - 0.9 s/co ratio    Comment: (NOTE)                                  Negative:     < 0.8                             Indeterminate: 0.8 - 0.9                                  Positive:     > 0.9 The CDC recommends that a positive HCV antibody result be followed up with a HCV Nucleic Acid Amplification test  (388828). Performed At: [facility] [place], [place] [id number] Lindon Romp MD [id number]    Hep A IgM Negative Negative   Hep B C IgM Negative Negative  Ethylene glycol     Status: Abnormal   Collection Time: 07/27/15  2:45 PM  Result Value Ref Range   Ethylene Glycol Lvl 47 (H) None detected mg/dL    Comment: (NOTE)                                Detection Limit = 5 Performed At: [facility] [place], [place] [id number] Lindon Romp MD [id number]   Hepatitis B surface antibody     Status: None   Collection Time: 07/27/15  4:00 PM  Result Value Ref Range   Hep B S Ab Non Reactive     Comment: (NOTE)              Non Reactive: Inconsistent with immunity,                            less than 10 mIU/mL              Reactive:     Consistent with immunity,  greater than 9.9 mIU/mL Performed At: [facility] [place], [place] [id number] Lindon Romp MD [id number]   Hepatitis B surface antigen     Status: None   Collection Time: 07/27/15  4:00 PM  Result Value Ref Range   Hepatitis B Surface Ag Negative Negative    Comment: (NOTE) Performed At: [facility] [address] [place], [place] [id number] Lindon Romp MD [id number]   Hepatitis B core antibody, total     Status: None   Collection Time: 07/27/15  4:00 PM  Result Value Ref Range   Hep B Core Total Ab Negative Negative    Comment: (NOTE) Performed At: [facility] [place], [place] [id number] Lindon Romp MD [id number]   Basic metabolic panel     Status: Abnormal   Collection Time: 07/27/15 11:17 PM  Result Value Ref Range   Sodium 135 135 - 145 mmol/L   Potassium 4.4 3.5 - 5.1 mmol/L   Chloride 102 101 - 111 mmol/L   CO2 23 22 - 32 mmol/L   Glucose, Bld 144 (H) 65 - 99 mg/dL   BUN <5 (L) 6 - 20 mg/dL   Creatinine, Ser 0.73 0.61 - 1.24 mg/dL    Calcium 7.8 (L) 8.9 - 10.3 mg/dL   GFR calc non Af Amer >60 >60 mL/min   GFR calc Af Amer >60 >60 mL/min    Comment: (NOTE) The eGFR has been calculated using the CKD EPI equation. This calculation has not been validated in all clinical situations. eGFR's persistently <60 mL/min signify possible Chronic Kidney Disease.    Anion gap 10 5 - 15  CBC     Status: Abnormal   Collection Time: 07/28/15  3:05 AM  Result Value Ref Range   WBC 6.4 4.0 - 10.5 K/uL   RBC 4.16 (L) 4.22 - 5.81 MIL/uL   Hemoglobin 13.6 13.0 - 17.0 g/dL   HCT 39.4 39.0 - 52.0 %   MCV 94.7 78.0 - 100.0 fL   MCH 32.7 26.0 - 34.0 pg   MCHC 34.5 30.0 - 36.0 g/dL   RDW 13.3 11.5 - 15.5 %   Platelets 127 (L) 150 - 400 K/uL  Basic metabolic panel     Status: Abnormal   Collection Time: 07/28/15  3:05 AM  Result Value Ref Range   Sodium 139 135 - 145 mmol/L   Potassium 3.5 3.5 - 5.1 mmol/L    Comment: DELTA CHECK NOTED   Chloride 100 (L) 101 - 111 mmol/L   CO2 31 22 - 32 mmol/L   Glucose, Bld 135 (H) 65 - 99 mg/dL   BUN <5 (L) 6 - 20 mg/dL   Creatinine, Ser 0.88 0.61 - 1.24 mg/dL   Calcium 7.9 (L) 8.9 - 10.3 mg/dL   GFR calc non Af Amer >60 >60 mL/min   GFR calc Af Amer >60 >60 mL/min    Comment: (NOTE) The eGFR has been calculated using the CKD EPI equation. This calculation has not been validated in all clinical situations. eGFR's persistently <60 mL/min signify possible Chronic Kidney Disease.    Anion gap 8 5 - 15  Magnesium     Status: None   Collection Time: 07/28/15  3:05 AM  Result Value Ref Range   Magnesium 1.7 1.7 - 2.4 mg/dL  Phosphorus     Status: Abnormal   Collection Time: 07/28/15  3:05 AM  Result Value Ref Range   Phosphorus 2.3 (L) 2.5 - 4.6  mg/dL  Basic metabolic panel     Status: Abnormal   Collection Time: 07/28/15 10:47 AM  Result Value Ref Range   Sodium 139 135 - 145 mmol/L   Potassium 3.8 3.5 - 5.1 mmol/L   Chloride 101 101 - 111 mmol/L   CO2 27 22 - 32 mmol/L   Glucose, Bld  200 (H) 65 - 99 mg/dL   BUN <5 (L) 6 - 20 mg/dL   Creatinine, Ser 0.98 0.61 - 1.24 mg/dL   Calcium 8.0 (L) 8.9 - 10.3 mg/dL   GFR calc non Af Amer >60 >60 mL/min   GFR calc Af Amer >60 >60 mL/min    Comment: (NOTE) The eGFR has been calculated using the CKD EPI equation. This calculation has not been validated in all clinical situations. eGFR's persistently <60 mL/min signify possible Chronic Kidney Disease.    Anion gap 11 5 - 15  Basic metabolic panel     Status: Abnormal   Collection Time: 07/28/15 11:28 PM  Result Value Ref Range   Sodium 134 (L) 135 - 145 mmol/L   Potassium 3.4 (L) 3.5 - 5.1 mmol/L   Chloride 98 (L) 101 - 111 mmol/L   CO2 27 22 - 32 mmol/L   Glucose, Bld 186 (H) 65 - 99 mg/dL   BUN <5 (L) 6 - 20 mg/dL   Creatinine, Ser 1.01 0.61 - 1.24 mg/dL   Calcium 8.3 (L) 8.9 - 10.3 mg/dL   GFR calc non Af Amer >60 >60 mL/min   GFR calc Af Amer >60 >60 mL/min    Comment: (NOTE) The eGFR has been calculated using the CKD EPI equation. This calculation has not been validated in all clinical situations. eGFR's persistently <60 mL/min signify possible Chronic Kidney Disease.    Anion gap 9 5 - 15  CBC     Status: Abnormal   Collection Time: 07/29/15  6:35 AM  Result Value Ref Range   WBC 5.2 4.0 - 10.5 K/uL   RBC 4.20 (L) 4.22 - 5.81 MIL/uL   Hemoglobin 13.7 13.0 - 17.0 g/dL   HCT 39.6 39.0 - 52.0 %   MCV 94.3 78.0 - 100.0 fL   MCH 32.6 26.0 - 34.0 pg   MCHC 34.6 30.0 - 36.0 g/dL   RDW 13.2 11.5 - 15.5 %   Platelets 130 (L) 150 - 400 K/uL    Current Facility-Administered Medications  Medication Dose Route Frequency Provider Last Rate Last Dose  . 0.9 %  sodium chloride infusion  250 mL Intravenous PRN Erick Colace, NP 10 mL/hr at 07/27/15 1242 250 mL at 07/27/15 1242  . 0.9 %  sodium chloride infusion  100 mL Intravenous PRN Roney Jaffe, MD      . 0.9 %  sodium chloride infusion  100 mL Intravenous PRN Roney Jaffe, MD      . alteplase (CATHFLO  ACTIVASE) injection 2 mg  2 mg Intracatheter Once PRN Roney Jaffe, MD      . arformoterol [facility]) nebulizer solution 15 mcg  15 mcg Nebulization Q12H Erick Colace, NP   15 mcg at 07/27/15 1619  . budesonide (PULMICORT) nebulizer solution 0.25 mg  0.25 mg Nebulization Q12H Erick Colace, NP   0.25 mg at 07/27/15 1619  . clonazePAM (KLONOPIN) tablet 0.5 mg  0.5 mg Oral TID PRN Erick Colace, NP   0.5 mg at 07/28/15 1605  . fentaNYL (SUBLIMAZE) injection 12.5 mcg  12.5 mcg Intravenous Q2H PRN Erick Colace, NP  12.5 mcg at 07/28/15 2348  . finasteride (PROSCAR) tablet 5 mg  5 mg Oral Daily Erick Colace, NP   5 mg at 07/28/15 1100  . folic acid injection 1 mg  1 mg Intravenous Daily Erick Colace, NP   1 mg at 07/28/15 1100  . fomepizole (ANTIZOL) 1,040 mg in sodium chloride 0.9 % 100 mL IVPB  10 mg/kg Intravenous Q12H Roney Jaffe, MD   1,040 mg at 07/29/15 0626  . heparin 1000 unit/ml injection 1,000-6,000 Units  1,000-6,000 Units Dialysis PRN Raylene Miyamoto, MD      . heparin injection 1,000 Units  1,000 Units Dialysis PRN Roney Jaffe, MD      . heparin injection 2,000-4,000 Units  2,000-4,000 Units Dialysis Q2H PRN Roney Jaffe, MD      . heparin injection 5,000 Units  5,000 Units Subcutaneous 3 times per day Erick Colace, NP   5,000 Units at 07/29/15 0526  . Influenza vac split quadrivalent PF (FLUARIX) injection 0.5 mL  0.5 mL Intramuscular Tomorrow-1000 Raylene Miyamoto, MD   0.5 mL at 07/28/15 1209  . lactulose (CHRONULAC) 10 GM/15ML solution 20 g  20 g Oral Daily PRN Erick Colace, NP      . lidocaine (PF) (XYLOCAINE) 1 % injection 5 mL  5 mL Intradermal PRN Roney Jaffe, MD      . lidocaine-prilocaine (EMLA) cream 1 application  1 application Topical PRN Roney Jaffe, MD      . LORazepam (ATIVAN) tablet 1 mg  1 mg Oral Q6H PRN Virginia Crews, MD       Or  . LORazepam (ATIVAN) injection 1 mg  1 mg Intravenous Q6H PRN Virginia Crews, MD   1 mg  at 07/28/15 1937  . multivitamin with minerals tablet 1 tablet  1 tablet Oral Daily Virginia Crews, MD   1 tablet at 07/28/15 1728  . oxyCODONE-acetaminophen (PERCOCET/ROXICET) 5-325 MG per tablet 1 tablet  1 tablet Oral Q6H PRN Rush Farmer, MD   1 tablet at 07/29/15 0526  . pantoprazole (PROTONIX) injection 40 mg  40 mg Intravenous Q12H Erick Colace, NP   40 mg at 07/28/15 2135  . pentafluoroprop-tetrafluoroeth (GEBAUERS) aerosol 1 application  1 application Topical PRN Roney Jaffe, MD      . pneumococcal 23 valent vaccine (PNU-IMMUNE) injection 0.5 mL  0.5 mL Intramuscular Tomorrow-1000 Raylene Miyamoto, MD   0.5 mL at 07/28/15 1210  . pyridOXINE (B-6) injection 100 mg  100 mg Intravenous Daily Erick Colace, NP   100 mg at 07/28/15 1100  . thiamine (VITAMIN B-1) tablet 100 mg  100 mg Oral Daily Virginia Crews, MD   100 mg at 07/28/15 1728   Or  . thiamine (B-1) injection 100 mg  100 mg Intravenous Daily Virginia Crews, MD        Musculoskeletal: Strength & Muscle Tone: within normal limits Gait & Station: normal Patient leans: N/A  Psychiatric Specialty Exam: ROS back pain, thirsty and paranoia but denied chest pain and SOB No Fever-chills, No Headache, No changes with Vision or hearing, reports vertigo No problems swallowing food or Liquids, No Chest pain, Cough or Shortness of Breath, No Abdominal pain, No Nausea or Vommitting, Bowel movements are regular, No Blood in stool or Urine, No dysuria, No new skin rashes or bruises, No new joints pains-aches,  No new weakness, tingling, numbness in any extremity, No recent weight gain or loss, No polyuria, polydypsia or  polyphagia,   A full 10 point Review of Systems was done, except as stated above, all other Review of Systems were negative.  Blood pressure 119/71, pulse 79, temperature 98.1 F (36.7 C), temperature source Oral, resp. rate 19, height '5\' 11"'  (1.803 m), weight 104 kg (229 lb 4.5 oz), SpO2  99 %.Body mass index is 31.99 kg/(m^2).  General Appearance: Casual  Eye Contact::  Good  Speech:  Clear and Coherent  Volume:  Normal  Mood:  Euthymic  Affect:  Appropriate and Congruent  Thought Process:  Coherent and Goal Directed  Orientation:  Full (Time, Place, and Person)  Thought Content:  Paranoid Ideation and Rumination  Suicidal Thoughts:  No  Homicidal Thoughts:  No  Memory:  Immediate;   Fair Recent;   Fair  Judgement:  Impaired  Insight:  Fair  Psychomotor Activity:  Normal  Concentration:  Fair  Recall:  [employer] of Knowledge:Fair  Language: Good  Akathisia:  Negative  Handed:  Right  AIMS (if indicated):     Assets:  Communication Skills Desire for Improvement Financial Resources/Insurance Housing Leisure Time Resilience Social Support Transportation  ADL's:  Intact  Cognition: WNL  Sleep:      Treatment Plan Summary: Daily contact with patient to assess and evaluate symptoms and progress in treatment and Medication management  Disposition: Manufacturing engineer as patient contract for safety  Psychiatric social service able to contact with the group home and also ACT team from [facility]  Patient does not meet criteria for psychiatric inpatient admission. Supportive therapy provided about ongoing stressors.  Patient will be referred to the outpatient medication management and act services and medically stable.   Tritia Endo,JANARDHAHA R. 07/29/2015 10:29 AM

## 2015-07-29 NOTE — Progress Notes (Signed)
Patient ID: [name], male   DOB: 05-Sep-1961, 54 y.o.   MRN: [id number] S:feels good, wants to go home O:BP 119/71 mmHg  Pulse 79  Temp(Src) 98.1 F (36.7 C) (Oral)  Resp 19  Ht 5\' 11"  (1.803 m)  Wt 104 kg (229 lb 4.5 oz)  BMI 31.99 kg/m2  SpO2 99%  Intake/Output Summary (Last 24 hours) at 07/29/15 0910 Last data filed at 07/29/15 0844  Gross per 24 hour  Intake   1937 ml  Output    550 ml  Net   1387 ml   Intake/Output: I/O last 3 completed shifts: In: 3089 [P.O.:1302; I.V.:1585; IV Piggyback:202] Out: 1937 [Urine:1525]  Intake/Output this shift:  Total I/O In: 240 [P.O.:240] Out: 0  Weight change: 0.5 kg (1 lb 1.6 oz) Gen:WD WN WM in NAd CVS:no rub Resp:cta TKW:IOXBDZ HGD:JMEQAST pretibial edema   Recent Labs Lab 07/26/15 2050  07/27/15 0324 07/27/15 1313 07/27/15 2317 07/28/15 0305 07/28/15 1047 07/28/15 2328 07/29/15 1009  NA 136  < > 140 137 135 139 139 134* 137  K 3.5  < > 4.6 5.0 4.4 3.5 3.8 3.4* 3.6  CL 102  < > 107 107 102 100* 101 98* 100*  CO2 23  --  21* 22 23 31 27 27 29   GLUCOSE 128*  < > 174* 137* 144* 135* 200* 186* 200*  BUN 8  < > 8 7 <5* <5* <5* <5* <5*  CREATININE 1.07  < > 1.15 0.98 0.73 0.88 0.98 1.01 1.00  ALBUMIN 4.0  --   --  3.7  --   --   --   --  3.4*  CALCIUM 8.7*  --  8.8* 8.9 7.8* 7.9* 8.0* 8.3* 8.3*  PHOS  --   --   --  4.1  --  2.3*  --   --  3.7  AST 16  --   --  16  --   --   --   --   --   ALT 15*  --   --  15*  --   --   --   --   --   < > = values in this interval not displayed. Liver Function Tests:  Recent Labs Lab 07/26/15 2050 07/27/15 1313  AST 16 16  ALT 15* 15*  ALKPHOS 54 65  BILITOT 0.6 0.6  PROT 6.7 6.2*  ALBUMIN 4.0 3.7    Recent Labs Lab 07/26/15 2050 07/27/15 1313  LIPASE 41 33   No results for input(s): AMMONIA in the last 168 hours. CBC:  Recent Labs Lab 07/26/15 2050  07/27/15 1313 07/28/15 0305 07/29/15 0635  WBC 7.0  --  5.5 6.4 5.2  NEUTROABS 3.6  --   --   --   --    HGB 15.2  < > 14.5 13.6 13.7  HCT 42.5  < > 42.5 39.4 39.6  MCV 94.2  --  95.7 94.7 94.3  PLT 152  --  140* 127* 130*  < > = values in this interval not displayed. Cardiac Enzymes: No results for input(s): CKTOTAL, CKMB, CKMBINDEX, TROPONINI in the last 168 hours. CBG:  Recent Labs Lab 07/27/15 1105  GLUCAP 144*    Iron Studies: No results for input(s): IRON, TIBC, TRANSFERRIN, FERRITIN in the last 72 hours. Studies/Results: Dg Chest Port 1 View  07/27/2015   CLINICAL DATA:  Central line placement.  EXAM: PORTABLE CHEST 1 VIEW  COMPARISON:  July 26, 2015.  FINDINGS: Stable  cardiomegaly. No pneumothorax or pleural effusion is noted. Interval placement of left internal jugular catheter with distal tip at expected position of the cavoatrial junction. No acute pulmonary disease is noted. Bony thorax is unremarkable.  IMPRESSION: Interval placement of left internal jugular catheter with distal tip at expected position of cavoatrial junction. No pneumothorax is noted.   Electronically Signed   By: Marijo Conception, M.D.   On: 07/27/2015 12:40   . arformoterol  15 mcg Nebulization Q12H  . budesonide (PULMICORT) nebulizer solution  0.25 mg Nebulization Q12H  . finasteride  5 mg Oral Daily  . folic acid  1 mg Intravenous Daily  . fomepizole (ANTIZOL) IV  10 mg/kg Intravenous Q12H  . heparin  5,000 Units Subcutaneous 3 times per day  . Influenza vac split quadrivalent PF  0.5 mL Intramuscular Tomorrow-1000  . multivitamin with minerals  1 tablet Oral Daily  . pantoprazole (PROTONIX) IV  40 mg Intravenous Q12H  . pneumococcal 23 valent vaccine  0.5 mL Intramuscular Tomorrow-1000  . pyridOXINE  100 mg Intravenous Daily  . thiamine  100 mg Oral Daily   Or  . thiamine  100 mg Intravenous Daily    BMET    Component Value Date/Time   NA 134* 07/28/2015 2328   K 3.4* 07/28/2015 2328   CL 98* 07/28/2015 2328   CO2 27 07/28/2015 2328   GLUCOSE 186* 07/28/2015 2328   BUN <5* 07/28/2015  2328   CREATININE 1.01 07/28/2015 2328   CREATININE 1.19 02/06/2015 1712   CALCIUM 8.3* 07/28/2015 2328   GFRNONAA >60 07/28/2015 2328   GFRNONAA 69 02/06/2015 1712   GFRAA >60 07/28/2015 2328   GFRAA 80 02/06/2015 1712   CBC    Component Value Date/Time   WBC 5.2 07/29/2015 0635   RBC 4.20* 07/29/2015 0635   HGB 13.7 07/29/2015 0635   HCT 39.6 07/29/2015 0635   PLT 130* 07/29/2015 0635   MCV 94.3 07/29/2015 0635   MCH 32.6 07/29/2015 0635   MCHC 34.6 07/29/2015 0635   RDW 13.2 07/29/2015 0635   LYMPHSABS 2.4 07/26/2015 2050   MONOABS 0.6 07/26/2015 2050   EOSABS 0.3 07/26/2015 2050   BASOSABS 0.1 07/26/2015 2050     Assessment/Plan:  1. 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 Good urine output and hemodynamically stable. Normal anion gap. Hold off on another session of hemodialysis until repeat ethylene glycol level is back from this morning but I suspect he will not need further HD. 1. Still no ethylene glycol level, unsure of delay as it was resulted the same day on admission and repeat.   2. Will repeat labs today  3. No electrolyte abnormalities so will cont with fomepizole and hold HD, however ethylene glycol level is needed. 2. Suicide attempt- need psych eval as I don't believe he could mistake antifreeze with tequilla, especially since he has a history of this in the past. 3. HTN- ok to resume metoprolol, hold lisinopril and diuretics for now. 4. Schizophrenia- per Psych  Alazar Cherian A

## 2015-07-29 NOTE — Progress Notes (Signed)
[facility] PROGRESS NOTE  [name] ZOX:[id number] DOB: 1961/01/13 DOA: 07/26/2015 PCP: Minerva Ends, MD  Length of stay: 2   Assessment/Plan: Principal Problem:   Ethylene glycol poisoning Active Problems:   Overdose   Encounter for central line placement   AKI (acute kidney injury) ([place])     Ethylene glycol poisoning-status post one session of hemodialysis and fomepizole Normal anion gap, repeat   ethylene glycol level  still pending Status post receiving IV bicarbonate Psychiatry consulted given history of schizophrenia Continue telemetry   EtOH abuse? Patient denies any history of alcohol abuse, continue thiamine and folic acid  Hypertension stable  Thrombocytopenia likely secondary to EtOH abuse  Hypoglycemia continue SSI, hemoglobin A1c 7.4 , hemoglobin A1c 7.4 on 8/8, resume NovoLog/Lantus flex pen on discharge   Schizophrenia -patient on paliperidone, psych to evaluate, also on Lexapro Klonopin Will need inpatient psychiatric evaluation Anticipate patient will be stable for discharge tomorrow  DVT prophylaxsis   Code Status:      Code Status Orders        Start     Ordered   07/27/15 1129  Full code   Continuous     07/27/15 1130     Family Communication: family updated about patient's clinical progress Disposition Plan:  As above    Brief narrative: 54 year old male w/ schizophrenia. Presented to ER at [facility] on 10/8 after ingesting antifreeze. He stated he was walking in the woods and found a tequila bottle w/ green liquid that he thought was a Data processing manager. He mixed this w/ his mountain dew and then said 1/2 way through realized "hey this isn't tequila; it's antifreeze!!" In ER ethylene glycol level was 89. Got fomepizole and was sent to [facility] for HD.    Consultants:  Psychiatry  Critical care  Nephrology  Procedures:  *None  Antibiotics: Anti-infectives    None         HPI/Subjective: Patient denies  any shortness of breath or chest pain, no abdominal pain or nausea or vomiting  Objective: Filed Vitals:   07/28/15 1723 07/28/15 2100 07/29/15 0500 07/29/15 0844  BP: 134/78 152/72 124/75 119/71  Pulse: 81 73 71 79  Temp: 97.5 F (36.4 C) 98 F (36.7 C) 98.6 F (37 C) 98.1 F (36.7 C)  TempSrc: Oral Oral Oral Oral  Resp: 20 18 18 19   Height:      Weight:  104 kg (229 lb 4.5 oz)    SpO2: 99% 97% 97% 99%    Intake/Output Summary (Last 24 hours) at 07/29/15 1244 Last data filed at 07/29/15 0844  Gross per 24 hour  Intake   1722 ml  Output      0 ml  Net   1722 ml    Exam:  General: No acute respiratory distress Lungs: Clear to auscultation bilaterally without wheezes or crackles Cardiovascular: Regular rate and rhythm without murmur gallop or rub normal S1 and S2 Abdomen: Nontender, nondistended, soft, bowel sounds positive, no rebound, no ascites, no appreciable mass Extremities: No significant cyanosis, clubbing, or edema bilateral lower extremities     Data Review   Micro Results Recent Results (from the past 240 hour(s))  MRSA PCR Screening     Status: None   Collection Time: 07/27/15 12:05 PM  Result Value Ref Range Status   MRSA by PCR NEGATIVE NEGATIVE Final    Comment:        The GeneXpert MRSA Assay (FDA approved  for NASAL specimens only), is one component of a comprehensive MRSA colonization surveillance program. It is not intended to diagnose MRSA infection nor to guide or monitor treatment for MRSA infections.     Radiology Reports Dg Chest Port 1 View  07/27/2015   CLINICAL DATA:  Central line placement.  EXAM: PORTABLE CHEST 1 VIEW  COMPARISON:  July 26, 2015.  FINDINGS: Stable cardiomegaly. No pneumothorax or pleural effusion is noted. Interval placement of left internal jugular catheter with distal tip at expected position of the cavoatrial junction. No acute pulmonary disease is noted. Bony thorax is unremarkable.  IMPRESSION: Interval  placement of left internal jugular catheter with distal tip at expected position of cavoatrial junction. No pneumothorax is noted.   Electronically Signed   By: Marijo Conception, M.D.   On: 07/27/2015 12:40   Dg Chest Port 1 View  07/26/2015   CLINICAL DATA:  54 year old who states that he may have ingested anti freeze (ethylene glycol) earlier today.  EXAM: PORTABLE CHEST 1 VIEW  COMPARISON:  06/03/2015 and earlier.  FINDINGS: Cardiac silhouette mildly enlarged for AP portable technique, unchanged. Lungs clear. Bronchovascular markings normal. Pulmonary vascularity normal. No visible pleural effusions. No pneumothorax.  IMPRESSION: Stable mild cardiomegaly.  No acute cardiopulmonary disease.   Electronically Signed   By: Evangeline Dakin M.D.   On: 07/26/2015 21:31     CBC  Recent Labs Lab 07/26/15 2050 07/26/15 2134 07/27/15 1313 07/28/15 0305 07/29/15 0635  WBC 7.0  --  5.5 6.4 5.2  HGB 15.2 15.0 14.5 13.6 13.7  HCT 42.5 44.0 42.5 39.4 39.6  PLT 152  --  140* 127* 130*  MCV 94.2  --  95.7 94.7 94.3  MCH 33.7  --  32.7 32.7 32.6  MCHC 35.8  --  34.1 34.5 34.6  RDW 13.3  --  13.4 13.3 13.2  LYMPHSABS 2.4  --   --   --   --   MONOABS 0.6  --   --   --   --   EOSABS 0.3  --   --   --   --   BASOSABS 0.1  --   --   --   --     Chemistries   Recent Labs Lab 07/26/15 2050  07/27/15 1313  07/28/15 0305 07/28/15 1047 07/28/15 2328 07/29/15 1009 07/29/15 1100  NA 136  < > 137  < > 139 139 134* 137 136  K 3.5  < > 5.0  < > 3.5 3.8 3.4* 3.6 4.3  CL 102  < > 107  < > 100* 101 98* 100* 100*  CO2 23  < > 22  < > 31 27 27 29 28   GLUCOSE 128*  < > 137*  < > 135* 200* 186* 200* 225*  BUN 8  < > 7  < > <5* <5* <5* <5* <5*  CREATININE 1.07  < > 0.98  < > 0.88 0.98 1.01 1.00 1.05  CALCIUM 8.7*  < > 8.9  < > 7.9* 8.0* 8.3* 8.3* 8.4*  MG  --   --   --   --  1.7  --   --   --   --   AST 16  --  16  --   --   --   --   --   --   ALT 15*  --  15*  --   --   --   --   --   --  ALKPHOS 54   --  65  --   --   --   --   --   --   BILITOT 0.6  --  0.6  --   --   --   --   --   --   < > = values in this interval not displayed. ------------------------------------------------------------------------------------------------------------------ estimated creatinine clearance is 97.6 mL/min (by C-G formula based on Cr of 1.05). ------------------------------------------------------------------------------------------------------------------ No results for input(s): HGBA1C in the last 72 hours. ------------------------------------------------------------------------------------------------------------------ No results for input(s): CHOL, HDL, LDLCALC, TRIG, CHOLHDL, LDLDIRECT in the last 72 hours. ------------------------------------------------------------------------------------------------------------------ No results for input(s): TSH, T4TOTAL, T3FREE, THYROIDAB in the last 72 hours.  Invalid input(s): FREET3 ------------------------------------------------------------------------------------------------------------------ No results for input(s): VITAMINB12, FOLATE, FERRITIN, TIBC, IRON, RETICCTPCT in the last 72 hours.  Coagulation profile No results for input(s): INR, PROTIME in the last 168 hours.  No results for input(s): DDIMER in the last 72 hours.  Cardiac Enzymes No results for input(s): CKMB, TROPONINI, MYOGLOBIN in the last 168 hours.  Invalid input(s): CK ------------------------------------------------------------------------------------------------------------------ Invalid input(s): POCBNP   CBG:  Recent Labs Lab 07/27/15 1105  GLUCAP 144*       Studies: No results found.    Lab Results  Component Value Date   HGBA1C 7.40 05/26/2015   HGBA1C 7.60 02/06/2015   HGBA1C 7.9 10/14/2014   Lab Results  Component Value Date   LDLCALC 95 11/07/2014   CREATININE 1.05 07/29/2015       Scheduled Meds: . arformoterol  15 mcg Nebulization Q12H  .  budesonide (PULMICORT) nebulizer solution  0.25 mg Nebulization Q12H  . finasteride  5 mg Oral Daily  . folic acid  1 mg Intravenous Daily  . fomepizole (ANTIZOL) IV  10 mg/kg Intravenous Q12H  . heparin  5,000 Units Subcutaneous 3 times per day  . Influenza vac split quadrivalent PF  0.5 mL Intramuscular Tomorrow-1000  . multivitamin with minerals  1 tablet Oral Daily  . pantoprazole (PROTONIX) IV  40 mg Intravenous Q12H  . pneumococcal 23 valent vaccine  0.5 mL Intramuscular Tomorrow-1000  . pyridOXINE  100 mg Intravenous Daily  . thiamine  100 mg Oral Daily   Or  . thiamine  100 mg Intravenous Daily   Continuous Infusions:   Principal Problem:   Ethylene glycol poisoning Active Problems:   Overdose   Encounter for central line placement   AKI (acute kidney injury) ([place])    Time spent: 45 minutes   [facility] Pager [phone]. If 7PM-7AM, please contact night-coverage at [email or web address], password [facility] 07/29/2015, 12:44 PM  LOS: 2 days

## 2015-07-30 DIAGNOSIS — M545 Low back pain: Secondary | ICD-10-CM

## 2015-07-30 DIAGNOSIS — G8929 Other chronic pain: Secondary | ICD-10-CM

## 2015-07-30 LAB — RENAL FUNCTION PANEL
ALBUMIN: 3.5 g/dL (ref 3.5–5.0)
Anion gap: 8 (ref 5–15)
BUN: <5 mg/dL — ABNORMAL LOW (ref 6–20)
CALCIUM: 8.6 mg/dL — AB (ref 8.9–10.3)
CO2: 27 mmol/L (ref 22–32)
CREATININE: 1.04 mg/dL (ref 0.61–1.24)
Chloride: 100 mmol/L — ABNORMAL LOW (ref 101–111)
Glucose, Bld: 149 mg/dL — ABNORMAL HIGH (ref 65–99)
PHOSPHORUS: 4.2 mg/dL (ref 2.5–4.6)
Potassium: 3.7 mmol/L (ref 3.5–5.1)
SODIUM: 135 mmol/L (ref 135–145)

## 2015-07-30 LAB — GLUCOSE, CAPILLARY
GLUCOSE-CAPILLARY: 113 mg/dL — AB (ref 65–99)
Glucose-Capillary: 172 mg/dL — ABNORMAL HIGH (ref 65–99)

## 2015-07-30 LAB — CBC
HCT: 37.8 % — ABNORMAL LOW (ref 39.0–52.0)
Hemoglobin: 13 g/dL (ref 13.0–17.0)
MCH: 32.6 pg (ref 26.0–34.0)
MCHC: 34.4 g/dL (ref 30.0–36.0)
MCV: 94.7 fL (ref 78.0–100.0)
PLATELETS: 120 10*3/uL — AB (ref 150–400)
RBC: 3.99 MIL/uL — AB (ref 4.22–5.81)
RDW: 13.3 % (ref 11.5–15.5)
WBC: 4.9 10*3/uL (ref 4.0–10.5)

## 2015-07-30 LAB — HEMOGLOBIN A1C
Hgb A1c MFr Bld: 6.8 % — ABNORMAL HIGH (ref 4.8–5.6)
Mean Plasma Glucose: 148 mg/dL

## 2015-07-30 MED ORDER — PYRIDOXINE HCL 100 MG/ML IJ SOLN: 100.0000 mg | Freq: Every day | INTRAMUSCULAR | Status: DC

## 2015-07-30 MED ORDER — ACETAMINOPHEN-CODEINE #3 300-30 MG PO TABS: 1.0000 | ORAL_TABLET | Freq: Three times a day (TID) | ORAL | Status: DC | PRN

## 2015-07-30 MED ORDER — ARFORMOTEROL TARTRATE 15 MCG/2ML IN NEBU: 15.0000 ug | INHALATION_SOLUTION | Freq: Two times a day (BID) | RESPIRATORY_TRACT | Status: DC

## 2015-07-30 MED ORDER — CLONAZEPAM 0.5 MG PO TABS: 0.5000 mg | ORAL_TABLET | Freq: Three times a day (TID) | ORAL | Status: DC

## 2015-07-30 MED ORDER — INSULIN GLARGINE 100 UNIT/ML ~~LOC~~ SOLN: 30.0000 [IU] | Freq: Every day | SUBCUTANEOUS | Status: DC

## 2015-07-30 MED ORDER — TORSEMIDE 20 MG PO TABS: 20.0000 mg | ORAL_TABLET | Freq: Two times a day (BID) | ORAL | Status: DC

## 2015-07-30 MED ORDER — THIAMINE HCL 100 MG PO TABS: 100.0000 mg | ORAL_TABLET | Freq: Every day | ORAL | Status: DC

## 2015-07-30 NOTE — Progress Notes (Signed)
[name] to be D/C'd Group Home per MD order.  Discussed prescriptions and follow up appointments with the patient. Prescriptions given to patient, medication list explained in detail. Pt verbalized understanding.    Medication List    STOP taking these medications        acetaminophen 650 MG CR tablet  Commonly known as:  TYLENOL 8 HOUR     gabapentin 800 MG tablet  Commonly known as:  NEURONTIN     glucose blood test strip     guaiFENesin 600 MG 12 hr tablet  Commonly known as:  MUCINEX     LANTUS SOLOSTAR 100 UNIT/ML Solostar Pen  Generic drug:  Insulin Glargine  Replaced by:  insulin glargine 100 UNIT/ML injection     terbinafine 250 MG tablet  Commonly known as:  LAMISIL      TAKE these medications        acetaminophen-codeine 300-30 MG tablet  Commonly known as:  TYLENOL #3  Take 1 tablet by mouth every 8 (eight) hours as needed for moderate pain.     albuterol 108 (90 BASE) MCG/ACT inhaler  Commonly known as:  PROVENTIL HFA;VENTOLIN HFA  Inhale 2 puffs into the lungs every 6 (six) hours as needed for wheezing or shortness of breath.     amitriptyline 25 MG tablet  Commonly known as:  ELAVIL  Take 25 mg by mouth at bedtime.     arformoterol 15 MCG/2ML Nebu  Commonly known as:  BROVANA  Take 2 mLs (15 mcg total) by nebulization every 12 (twelve) hours.     aspirin EC 81 MG tablet  Take 81 mg by mouth daily.     atorvastatin 40 MG tablet  Commonly known as:  LIPITOR  Take 1 tablet (40 mg total) by mouth daily.     betamethasone dipropionate 0.05 % ointment  Commonly known as:  DIPROLENE  Apply topically 2 (two) times daily. On hands     cetirizine 10 MG tablet  Commonly known as:  ZYRTEC  Take 1 tablet (10 mg total) by mouth daily.     clonazePAM 0.5 MG tablet  Commonly known as:  KLONOPIN  Take 1 tablet (0.5 mg total) by mouth 3 (three) times daily.     Dextromethorphan-Guaifenesin 10-200 MG Caps  Commonly known as:  CORICIDIN HBP  CONGESTION/COUGH  Take 1-2 capsules by mouth 3 (three) times daily as needed (cough or congeston).     escitalopram 20 MG tablet  Commonly known as:  LEXAPRO  Take 1 tablet (20 mg total) by mouth daily.     finasteride 5 MG tablet  Commonly known as:  PROSCAR  Take 1 tablet (5 mg total) by mouth daily.     Fluticasone-Salmeterol 100-50 MCG/DOSE Aepb  Commonly known as:  ADVAIR  Inhale 1 puff into the lungs 2 (two) times daily.     insulin glargine 100 UNIT/ML injection  Commonly known as:  LANTUS  Inject 0.3 mLs (30 Units total) into the skin daily at 10 pm.     isosorbide mononitrate 30 MG 24 hr tablet  Commonly known as:  IMDUR  Take 30 mg by mouth daily.     ketoconazole 2 % cream  Commonly known as:  NIZORAL  Apply 1 application topically 2 (two) times daily as needed for irritation.     lactulose 10 GM/15ML solution  Commonly known as:  CHRONULAC  Take 30 mLs (20 g total) by mouth daily as needed for mild constipation.  lisinopril 10 MG tablet  Commonly known as:  PRINIVIL,ZESTRIL  Take 10 mg by mouth daily.     loratadine 10 MG tablet  Commonly known as:  CLARITIN  Take 10 mg by mouth daily.     lubiprostone 24 MCG capsule  Commonly known as:  AMITIZA  Take 24 mcg by mouth daily.     metoprolol tartrate 25 MG tablet  Commonly known as:  LOPRESSOR  Take 1 tablet (25 mg total) by mouth 2 (two) times daily.     MILK OF MAGNESIA PO  Take 30 mLs by mouth daily as needed (indigestion).     mometasone 50 MCG/ACT nasal spray  Commonly known as:  NASONEX  2 prays into each nostril daily     NIFEdipine 90 MG 24 hr tablet  Commonly known as:  ADALAT CC  Take 90 mg by mouth daily.     NOVOLOG FLEXPEN 100 UNIT/ML FlexPen  Generic drug:  insulin aspart  Inject 15-30 Units into the skin 3 (three) times daily with meals. Inject 15 units subcutaneously before each meal plus ss: CBG 150-200 17 units, 201-300 19 units, 301-400 23 units, 401-500 27 units, >500 30  units and call MD     omeprazole 20 MG capsule  Commonly known as:  PRILOSEC  Take 20 mg by mouth daily.     paliperidone 234 MG/1.5ML Susp injection  Commonly known as:  INVEGA SUSTENNA  Inject 234 mg into the muscle every 28 (twenty-eight) days.     thiamine 100 MG tablet  Take 1 tablet (100 mg total) by mouth daily.     torsemide 20 MG tablet  Commonly known as:  DEMADEX  Take 1 tablet (20 mg total) by mouth 2 (two) times daily.     Vitamin D (Ergocalciferol) 50000 UNITS Caps capsule  Commonly known as:  DRISDOL  Take 50,000 Units by mouth every Wednesday.        Filed Vitals:   07/30/15 0905  BP: 130/54  Pulse: 70  Temp: 97.8 F (36.6 C)  Resp: 19    Skin clean, dry and intact without evidence of skin break down, no evidence of skin tears noted. IV catheter discontinued intact. Site without signs and symptoms of complications. Dressing and pressure applied. No complaints noted.  An After Visit Summary was printed and given to the patient. Group home staff are here to pick him up. Patient ambulated and escorted out.  Retta Mac BSN, RN

## 2015-07-30 NOTE — Progress Notes (Signed)
Patient's belongings returned to patient from security safe.  Joellen Jersey, RN.

## 2015-07-30 NOTE — Progress Notes (Signed)
Patient complained of chest and neck pain. His blood was elevated greater 562 systolic. He remained asymptomatic.  Notified Melody Haver covering NP. She gave a one time order for hydralazine 5mg  and order an EKG. When recheck patient bp prior to giving the hydralazine, it was 143/80 HR 63. Did not administer the hydralazine since his systolic bp was less than 170. Patient resolved after receiving IV fentanyl.   Patient also verbalizing people were trying to kill him the last 2 days with how they were administering his medications. He said someone was plotting in the room across the hall to harm him. I also made  Notified Melody Haver NP aware. Will continue to monitor for patient safety

## 2015-07-30 NOTE — Care Management Important Message (Signed)
Important Message  Patient Details  Name: [name] MRN: [id number] Date of Birth: 1961-05-01   Medicare Important Message Given:  Yes-second notification given    Delorse Lek 07/30/2015, 10:33 AM

## 2015-07-30 NOTE — Discharge Planning (Signed)
Patient will discharge today per MD order. Patient will discharge to (return): [place] Director: Ms Oswaldo Milian [phone] Transportation: [place] on chart  Psych CSW sent dc summary and FL2 to group home for review.  Nonnie Done, [place] [phone]  Psychiatric & Orthopedics (5N 1-16) Clinical Social Worker

## 2015-07-30 NOTE — Discharge Summary (Signed)
Physician Discharge Summary  [name] MRN: [id number] DOB/AGE: 05-18-1961 54 y.o.  PCP: Minerva Ends, MD   Admit date: 07/26/2015 Discharge date: 07/30/2015  Discharge Diagnoses:     Principal Problem:   Ethylene glycol poisoning Active Problems:   Overdose   Encounter for central line placement   AKI (acute kidney injury) ([place])  schizophrenia   Follow-up recommendations Follow-up with PCP in 3-5 days , including all  additional recommended appointments as below Follow-up CBC, CMP in 3-5 days Patient to follow-up with [facility] team: Marinus Maw [phone] Patient follow-up with PCP in 3-5 days to ensure stability of renal function    Medication List    STOP taking these medications        acetaminophen 650 MG CR tablet  Commonly known as:  TYLENOL 8 HOUR     gabapentin 800 MG tablet  Commonly known as:  NEURONTIN     glucose blood test strip     guaiFENesin 600 MG 12 hr tablet  Commonly known as:  MUCINEX     LANTUS SOLOSTAR 100 UNIT/ML Solostar Pen  Generic drug:  Insulin Glargine  Replaced by:  insulin glargine 100 UNIT/ML injection     terbinafine 250 MG tablet  Commonly known as:  LAMISIL      TAKE these medications        acetaminophen-codeine 300-30 MG tablet  Commonly known as:  TYLENOL #3  Take 1 tablet by mouth every 8 (eight) hours as needed for moderate pain.     albuterol 108 (90 BASE) MCG/ACT inhaler  Commonly known as:  PROVENTIL HFA;VENTOLIN HFA  Inhale 2 puffs into the lungs every 6 (six) hours as needed for wheezing or shortness of breath.     amitriptyline 25 MG tablet  Commonly known as:  ELAVIL  Take 25 mg by mouth at bedtime.     arformoterol 15 MCG/2ML Nebu  Commonly known as:  BROVANA  Take 2 mLs (15 mcg total) by nebulization every 12 (twelve) hours.     aspirin EC 81 MG tablet  Take 81 mg by mouth daily.     atorvastatin 40 MG tablet  Commonly known as:  LIPITOR  Take 1 tablet (40 mg total) by mouth daily.      betamethasone dipropionate 0.05 % ointment  Commonly known as:  DIPROLENE  Apply topically 2 (two) times daily. On hands     cetirizine 10 MG tablet  Commonly known as:  ZYRTEC  Take 1 tablet (10 mg total) by mouth daily.     clonazePAM 0.5 MG tablet  Commonly known as:  KLONOPIN  Take 1 tablet (0.5 mg total) by mouth 3 (three) times daily.     Dextromethorphan-Guaifenesin 10-200 MG Caps  Commonly known as:  CORICIDIN HBP CONGESTION/COUGH  Take 1-2 capsules by mouth 3 (three) times daily as needed (cough or congeston).     escitalopram 20 MG tablet  Commonly known as:  LEXAPRO  Take 1 tablet (20 mg total) by mouth daily.     finasteride 5 MG tablet  Commonly known as:  PROSCAR  Take 1 tablet (5 mg total) by mouth daily.     Fluticasone-Salmeterol 100-50 MCG/DOSE Aepb  Commonly known as:  ADVAIR  Inhale 1 puff into the lungs 2 (two) times daily.     insulin glargine 100 UNIT/ML injection  Commonly known as:  LANTUS  Inject 0.3 mLs (30 Units total) into the skin daily at 10 pm.     isosorbide mononitrate  30 MG 24 hr tablet  Commonly known as:  IMDUR  Take 30 mg by mouth daily.     ketoconazole 2 % cream  Commonly known as:  NIZORAL  Apply 1 application topically 2 (two) times daily as needed for irritation.     lactulose 10 GM/15ML solution  Commonly known as:  CHRONULAC  Take 30 mLs (20 g total) by mouth daily as needed for mild constipation.     lisinopril 10 MG tablet  Commonly known as:  PRINIVIL,ZESTRIL  Take 10 mg by mouth daily.     loratadine 10 MG tablet  Commonly known as:  CLARITIN  Take 10 mg by mouth daily.     lubiprostone 24 MCG capsule  Commonly known as:  AMITIZA  Take 24 mcg by mouth daily.     metoprolol tartrate 25 MG tablet  Commonly known as:  LOPRESSOR  Take 1 tablet (25 mg total) by mouth 2 (two) times daily.     MILK OF MAGNESIA PO  Take 30 mLs by mouth daily as needed (indigestion).     mometasone 50 MCG/ACT nasal spray  Commonly  known as:  NASONEX  2 prays into each nostril daily     NIFEdipine 90 MG 24 hr tablet  Commonly known as:  ADALAT CC  Take 90 mg by mouth daily.     NOVOLOG FLEXPEN 100 UNIT/ML FlexPen  Generic drug:  insulin aspart  Inject 15-30 Units into the skin 3 (three) times daily with meals. Inject 15 units subcutaneously before each meal plus ss: CBG 150-200 17 units, 201-300 19 units, 301-400 23 units, 401-500 27 units, >500 30 units and call MD     omeprazole 20 MG capsule  Commonly known as:  PRILOSEC  Take 20 mg by mouth daily.     paliperidone 234 MG/1.5ML Susp injection  Commonly known as:  INVEGA SUSTENNA  Inject 234 mg into the muscle every 28 (twenty-eight) days.     thiamine 100 MG tablet  Take 1 tablet (100 mg total) by mouth daily.     torsemide 20 MG tablet  Commonly known as:  DEMADEX  Take 1 tablet (20 mg total) by mouth 2 (two) times daily.     Vitamin D (Ergocalciferol) 50000 UNITS Caps capsule  Commonly known as:  DRISDOL  Take 50,000 Units by mouth every Wednesday.         Discharge Condition: Stable  Disposition:    Consults: Psychiatry Critical care Nephrology     Significant Diagnostic Studies:  Dg Chest Port 1 View  07/27/2015  CLINICAL DATA:  Central line placement. EXAM: PORTABLE CHEST 1 VIEW COMPARISON:  July 26, 2015. FINDINGS: Stable cardiomegaly. No pneumothorax or pleural effusion is noted. Interval placement of left internal jugular catheter with distal tip at expected position of the cavoatrial junction. No acute pulmonary disease is noted. Bony thorax is unremarkable. IMPRESSION: Interval placement of left internal jugular catheter with distal tip at expected position of cavoatrial junction. No pneumothorax is noted. Electronically Signed   By: Marijo Conception, M.D.   On: 07/27/2015 12:40   Dg Chest Port 1 View  07/26/2015  CLINICAL DATA:  54 year old who states that he may have ingested anti freeze (ethylene glycol) earlier today. EXAM:  PORTABLE CHEST 1 VIEW COMPARISON:  06/03/2015 and earlier. FINDINGS: Cardiac silhouette mildly enlarged for AP portable technique, unchanged. Lungs clear. Bronchovascular markings normal. Pulmonary vascularity normal. No visible pleural effusions. No pneumothorax. IMPRESSION: Stable mild cardiomegaly.  No acute cardiopulmonary  disease. Electronically Signed   By: Evangeline Dakin M.D.   On: 07/26/2015 21:31        Filed Weights   07/27/15 1520 07/28/15 0500 07/28/15 2100  Weight: 104.6 kg (230 lb 9.6 oz) 105 kg (231 lb 7.7 oz) 104 kg (229 lb 4.5 oz)     Microbiology: Recent Results (from the past 240 hour(s))  MRSA PCR Screening     Status: None   Collection Time: 07/27/15 12:05 PM  Result Value Ref Range Status   MRSA by PCR NEGATIVE NEGATIVE Final    Comment:        The GeneXpert MRSA Assay (FDA approved for NASAL specimens only), is one component of a comprehensive MRSA colonization surveillance program. It is not intended to diagnose MRSA infection nor to guide or monitor treatment for MRSA infections.        Blood Culture No results found for: SDES, SPECREQUEST, CULT, REPTSTATUS    Labs: Results for orders placed or performed during the hospital encounter of 07/26/15 (from the past 48 hour(s))  Basic metabolic panel     Status: Abnormal   Collection Time: 07/28/15 11:28 PM  Result Value Ref Range   Sodium 134 (L) 135 - 145 mmol/L   Potassium 3.4 (L) 3.5 - 5.1 mmol/L   Chloride 98 (L) 101 - 111 mmol/L   CO2 27 22 - 32 mmol/L   Glucose, Bld 186 (H) 65 - 99 mg/dL   BUN <5 (L) 6 - 20 mg/dL   Creatinine, Ser 1.01 0.61 - 1.24 mg/dL   Calcium 8.3 (L) 8.9 - 10.3 mg/dL   GFR calc non Af Amer >60 >60 mL/min   GFR calc Af Amer >60 >60 mL/min    Comment: (NOTE) The eGFR has been calculated using the CKD EPI equation. This calculation has not been validated in all clinical situations. eGFR's persistently <60 mL/min signify possible Chronic Kidney Disease.    Anion  gap 9 5 - 15  CBC     Status: Abnormal   Collection Time: 07/29/15  6:35 AM  Result Value Ref Range   WBC 5.2 4.0 - 10.5 K/uL   RBC 4.20 (L) 4.22 - 5.81 MIL/uL   Hemoglobin 13.7 13.0 - 17.0 g/dL   HCT 39.6 39.0 - 52.0 %   MCV 94.3 78.0 - 100.0 fL   MCH 32.6 26.0 - 34.0 pg   MCHC 34.6 30.0 - 36.0 g/dL   RDW 13.2 11.5 - 15.5 %   Platelets 130 (L) 150 - 400 K/uL  Hemoglobin A1c     Status: Abnormal   Collection Time: 07/29/15  6:35 AM  Result Value Ref Range   Hgb A1c MFr Bld 6.8 (H) 4.8 - 5.6 %    Comment: (NOTE)         Pre-diabetes: 5.7 - 6.4         Diabetes: >6.4         Glycemic control for adults with diabetes: <7.0    Mean Plasma Glucose 148 mg/dL    Comment: (NOTE) Performed At: [facility] [place], [place] [id number] Lindon Romp MD [id number]   Renal function panel     Status: Abnormal   Collection Time: 07/29/15 10:09 AM  Result Value Ref Range   Sodium 137 135 - 145 mmol/L   Potassium 3.6 3.5 - 5.1 mmol/L   Chloride 100 (L) 101 - 111 mmol/L   CO2 29 22 - 32 mmol/L   Glucose, Bld 200 (H)  65 - 99 mg/dL   BUN <5 (L) 6 - 20 mg/dL   Creatinine, Ser 1.00 0.61 - 1.24 mg/dL   Calcium 8.3 (L) 8.9 - 10.3 mg/dL   Phosphorus 3.7 2.5 - 4.6 mg/dL   Albumin 3.4 (L) 3.5 - 5.0 g/dL   GFR calc non Af Amer >60 >60 mL/min   GFR calc Af Amer >60 >60 mL/min    Comment: (NOTE) The eGFR has been calculated using the CKD EPI equation. This calculation has not been validated in all clinical situations. eGFR's persistently <60 mL/min signify possible Chronic Kidney Disease.    Anion gap 8 5 - 15  Ethylene glycol     Status: None   Collection Time: 07/29/15 10:09 AM  Result Value Ref Range   Ethylene Glycol Lvl None Detected None detected mg/dL    Comment: (NOTE)                                Detection Limit = 5 Performed At: [facility] [place], [place] [id number] Lindon Romp MD OA:[id number]   Basic metabolic  panel     Status: Abnormal   Collection Time: 07/29/15 11:00 AM  Result Value Ref Range   Sodium 136 135 - 145 mmol/L   Potassium 4.3 3.5 - 5.1 mmol/L    Comment: HEMOLYSIS AT THIS LEVEL MAY AFFECT RESULT   Chloride 100 (L) 101 - 111 mmol/L   CO2 28 22 - 32 mmol/L   Glucose, Bld 225 (H) 65 - 99 mg/dL   BUN <5 (L) 6 - 20 mg/dL   Creatinine, Ser 1.05 0.61 - 1.24 mg/dL   Calcium 8.4 (L) 8.9 - 10.3 mg/dL   GFR calc non Af Amer >60 >60 mL/min   GFR calc Af Amer >60 >60 mL/min    Comment: (NOTE) The eGFR has been calculated using the CKD EPI equation. This calculation has not been validated in all clinical situations. eGFR's persistently <60 mL/min signify possible Chronic Kidney Disease.    Anion gap 8 5 - 15  Glucose, capillary     Status: Abnormal   Collection Time: 07/29/15  5:45 PM  Result Value Ref Range   Glucose-Capillary 169 (H) 65 - 99 mg/dL  Glucose, capillary     Status: Abnormal   Collection Time: 07/29/15  7:53 PM  Result Value Ref Range   Glucose-Capillary 185 (H) 65 - 99 mg/dL  Renal function panel     Status: Abnormal   Collection Time: 07/30/15  5:11 AM  Result Value Ref Range   Sodium 135 135 - 145 mmol/L   Potassium 3.7 3.5 - 5.1 mmol/L   Chloride 100 (L) 101 - 111 mmol/L   CO2 27 22 - 32 mmol/L   Glucose, Bld 149 (H) 65 - 99 mg/dL   BUN <5 (L) 6 - 20 mg/dL   Creatinine, Ser 1.04 0.61 - 1.24 mg/dL   Calcium 8.6 (L) 8.9 - 10.3 mg/dL   Phosphorus 4.2 2.5 - 4.6 mg/dL   Albumin 3.5 3.5 - 5.0 g/dL   GFR calc non Af Amer >60 >60 mL/min   GFR calc Af Amer >60 >60 mL/min    Comment: (NOTE) The eGFR has been calculated using the CKD EPI equation. This calculation has not been validated in all clinical situations. eGFR's persistently <60 mL/min signify possible Chronic Kidney Disease.    Anion gap 8 5 - 15  CBC  Status: Abnormal   Collection Time: 07/30/15  5:11 AM  Result Value Ref Range   WBC 4.9 4.0 - 10.5 K/uL   RBC 3.99 (L) 4.22 - 5.81 MIL/uL    Hemoglobin 13.0 13.0 - 17.0 g/dL   HCT 37.8 (L) 39.0 - 52.0 %   MCV 94.7 78.0 - 100.0 fL   MCH 32.6 26.0 - 34.0 pg   MCHC 34.4 30.0 - 36.0 g/dL   RDW 13.3 11.5 - 15.5 %   Platelets 120 (L) 150 - 400 K/uL  Glucose, capillary     Status: Abnormal   Collection Time: 07/30/15  8:06 AM  Result Value Ref Range   Glucose-Capillary 113 (H) 65 - 99 mg/dL  Glucose, capillary     Status: Abnormal   Collection Time: 07/30/15 11:38 AM  Result Value Ref Range   Glucose-Capillary 172 (H) 65 - 99 mg/dL     Lipid Panel     Component Value Date/Time   CHOL 190 11/07/2014 1143   TRIG 325* 11/07/2014 1143   HDL 30* 11/07/2014 1143   CHOLHDL 6.3 11/07/2014 1143   VLDL 65* 11/07/2014 1143   LDLCALC 95 11/07/2014 1143     Lab Results  Component Value Date   HGBA1C 6.8* 07/29/2015   HGBA1C 7.40 05/26/2015   HGBA1C 7.60 02/06/2015     Lab Results  Component Value Date   LDLCALC 95 11/07/2014   CREATININE 1.04 07/30/2015     HPI :*54 y.o. year-old with hx of schizophrenia, previous suicide attempts with ethylene glycol who was admitted for ethylene glycol ingestion. Hx of same in the past. Ethylene glycol level is  89 g/dL drawn last night about 10:30 pm. Normal level is <5 mg/dL and dialysis is recommended for this level according to Poison Control. Asked to see for dialysis.   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 Multiple suicide attempts, hx schizophrenia, see below. Denies active CP, sob, n/v/d, abd pain. No joint pain or rash. 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 Patient denies any history of alcohol abuse, continue thiamine and folic acid, Xanax  Hypertension stable  Thrombocytopenia likely secondary to EtOH abuse  Hypoglycemia continue SSI, hemoglobin A1c 7.4 , hemoglobin A1c 7.4 on 8/8, resume NovoLog/Lantus flex pen on discharge   Schizophrenia -patient on paliperidone, discussed with psychiatry Ambrose Finland, MD and reviewed psychiatric medications with him prior to discharge, continue Elavil, Lexapro Klonopin Okay to discharge for psychiatry   Discharge Exam:   Blood pressure 130/54, pulse 70, temperature 97.8 F (36.6 C), temperature source Oral, resp. rate 19, height $RemoveBe'5\' 11"'[id number]$  (1.803 m), weight 104 kg (229 lb 4.5 oz), SpO2 98 %.  Gen:WD WN WM in NAD CVS:no rub Resp:cta ENI:DPOEUM PNT:IRWER edema       Discharge Instructions    Diet - low sodium heart healthy    Complete by:  As directed      Increase activity slowly    Complete by:  As directed              Signed: Lynnae Ludemann 07/30/2015, 12:10 PM  Time spent >45 mins   

## 2015-07-30 NOTE — Clinical Social Work Psych Note (Signed)
Patient will return to [facility] at time of discharge.  Group Home will provide transportation.  Group Home requesting to review signed FL2 and discharge summary prior to transport.  MD made aware.  Nonnie Done, LCSW [phone]  [facility] Licensed Clinical Social Worker

## 2015-07-30 NOTE — Consult Note (Signed)
[facility] Face-to-Face Psychiatry Consult Follow up  Reason for Consult:  Intentional overdose Referring Physician:  Dr. Allyson Sabal Patient Identification: [name] MRN:  [id number] Principal Diagnosis: Ethylene glycol poisoning Diagnosis:   Patient Active Problem List   Diagnosis Date Noted  . AKI (acute kidney injury) ([place]) [N17.9]   . Overdose [T50.901A] 07/27/2015  . Encounter for central line placement [Z45.2]   . 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 Chest pain [R07.9] 05/26/2015  . Healthcare maintenance [Z00.00] 02/10/2015  . Constipation [K59.00] 02/06/2015  . BPH (benign prostatic hyperplasia) [N40.0] 01/16/2015  . Bilateral leg edema [R60.0] 01/16/2015  . Vitamin D deficiency [E55.9] 01/16/2015  . Allergic rhinitis [J30.9] 11/19/2014  . Chronic low back pain [M54.5, G89.29] 11/07/2014  . Onychomycosis of toenail [B35.1] 11/07/2014  . Tinea pedis [B35.3] 11/07/2014  . Alcohol abuse [F10.10] 10/17/2014  . Psoriasis [L40.9] 10/14/2014  . HTN (hypertension) [I10] 10/14/2014  . Diabetes mellitus type 2, controlled ([place]) [E11.9] 10/14/2014  . COPD (chronic obstructive pulmonary disease) ([place]) [J44.9] 10/14/2014  . Tobacco abuse [Z72.0] 10/14/2014  . HLD (hyperlipidemia) [E78.5] 10/14/2014  . Schizoaffective disorder ([place]) [F25.9] 10/14/2014    Total Time spent with patient: 30 minutes  Subjective:   [name] is a 54 y.o. male patient admitted with intentional overdose.  HPI:  [name] is a 54 y.o. Male with a significant history of chronic, recurrent schizoaffective disorder, polysubstance abuse and drug-seeking behavior admitted to [facility] from [facility] status post intentional ingestion of ethylene glycol. Patient stated that he called himself for help after ingestion. Patient required hemodialysis and now considered to be medically stable to be discharged. Psychiatric consultation requested for safety and evaluation of depression,  anxiety and psychosis. Patient has ongoing paranoid delusional thoughts but no suicidal/homicidal ideation intention or plans. Patient is known to have drug-seeking behavior especially opiates and according while in the group home. Patient [facility] disease and he does not get money to buy on his own as per his act members and group home reported. Reportedly there are no woods near the group home and the staff over there not able to the identify how he ended up accessing ethylene glycol. Patient has history of ethylene glycol injection at least 6 times in the past and last admission to the hospitalist 2008. Patient stated that he does not want to be hospitalized or incarcerated he likes to go back to his community stay safe in his group home follow-up with mental health care. Patient contract for safety during this evaluation.  Past Psychiatric History: Patient has been suffering with schizoaffective disorder, history of polysubstance abuse including alcohol, nicotine and pain medication. Patient has been followed with [facility] for [facility] health and also has ACT team.  Interval history: patient has no complaints today and has ongoing paranoid ideation without behavioral problems. He has no active symptoms of cravings for drugs, denied depression, anxiety, A/V hallucinations. He does not appeared to be responding to internal stimuli. He has intact good reality. Denied SI/HI and contract for safety. Case discussed with social service and Dr. Allyson Sabal.   Risk to Self: Is patient at risk for suicide?: No Risk to Others:   Prior Inpatient Therapy:   Prior Outpatient Therapy:    Past Medical History:  Past Medical History  Diagnosis Date  . Diabetes mellitus without complication ([place]) 6761  . Hypertension 2008  . Schizoaffective disorder ([place]) 2006   . COPD (chronic obstructive pulmonary disease) ([place])   .  Alcohol dependence ([place]) 1979    stated abusing ETOH at age 19   .  Benzodiazepine dependence ([place])   . Cocaine abuse   . MI (myocardial infarction) ([place])   . Cardiac arrest [facility])    History reviewed. No pertinent past surgical history. Family History:  Family History  Problem Relation Age of Onset  . Heart disease Mother   . Heart disease Father   . Heart disease Maternal Grandmother   . Diabetes Maternal Grandmother    Family Psychiatric  History: Patient does not want talk about his family history of mental illness and reportedly spoke with his mother recently.  Social History:  History  Alcohol Use No     History  Drug Use No    Social History   Social History  . Marital Status: Single    Spouse Name: N/A  . Number of Children: N/A  . Years of Education: N/A   Social History Main Topics  . Smoking status: Heavy Tobacco Smoker -- 1.50 packs/day    Types: Cigarettes  . Smokeless tobacco: None  . Alcohol Use: No  . Drug Use: No  . Sexual Activity: No   Other Topics Concern  . None   Social History Narrative   Additional Social History: Patient is a resident of a local group home in [place] area and follow with activity members. Patient is disabled more than 5 years from now. Patient has significant history of alcohol abuse versus dependence and chronic mental illness. Patient also reportedly incarcerated several years ago.                           Allergies:   Allergies  Allergen Reactions  . Codeine Itching  . Haldol [Haloperidol Lactate] Other (See Comments)    Nightmares   . Seroquel [Quetiapine Fumarate] Other (See Comments)    Nightmares   . Trazodone And Nefazodone Other (See Comments)    Nightmares     Labs:  Results for orders placed or performed during the hospital encounter of 07/26/15 (from the past 48 hour(s))  Basic metabolic panel     Status: Abnormal   Collection Time: 07/28/15 10:47 AM  Result Value Ref Range   Sodium 139 135 - 145 mmol/L   Potassium 3.8 3.5 - 5.1 mmol/L    Chloride 101 101 - 111 mmol/L   CO2 27 22 - 32 mmol/L   Glucose, Bld 200 (H) 65 - 99 mg/dL   BUN <5 (L) 6 - 20 mg/dL   Creatinine, Ser 0.98 0.61 - 1.24 mg/dL   Calcium 8.0 (L) 8.9 - 10.3 mg/dL   GFR calc non Af Amer >60 >60 mL/min   GFR calc Af Amer >60 >60 mL/min    Comment: (NOTE) The eGFR has been calculated using the CKD EPI equation. This calculation has not been validated in all clinical situations. eGFR's persistently <60 mL/min signify possible Chronic Kidney Disease.    Anion gap 11 5 - 15  Basic metabolic panel     Status: Abnormal   Collection Time: 07/28/15 11:28 PM  Result Value Ref Range   Sodium 134 (L) 135 - 145 mmol/L   Potassium 3.4 (L) 3.5 - 5.1 mmol/L   Chloride 98 (L) 101 - 111 mmol/L   CO2 27 22 - 32 mmol/L   Glucose, Bld 186 (H) 65 - 99 mg/dL   BUN <5 (L) 6 - 20 mg/dL   Creatinine, Ser 1.01 0.61 - 1.24  mg/dL   Calcium 8.3 (L) 8.9 - 10.3 mg/dL   GFR calc non Af Amer >60 >60 mL/min   GFR calc Af Amer >60 >60 mL/min    Comment: (NOTE) The eGFR has been calculated using the CKD EPI equation. This calculation has not been validated in all clinical situations. eGFR's persistently <60 mL/min signify possible Chronic Kidney Disease.    Anion gap 9 5 - 15  CBC     Status: Abnormal   Collection Time: 07/29/15  6:35 AM  Result Value Ref Range   WBC 5.2 4.0 - 10.5 K/uL   RBC 4.20 (L) 4.22 - 5.81 MIL/uL   Hemoglobin 13.7 13.0 - 17.0 g/dL   HCT 39.6 39.0 - 52.0 %   MCV 94.3 78.0 - 100.0 fL   MCH 32.6 26.0 - 34.0 pg   MCHC 34.6 30.0 - 36.0 g/dL   RDW 13.2 11.5 - 15.5 %   Platelets 130 (L) 150 - 400 K/uL  Hemoglobin A1c     Status: Abnormal   Collection Time: 07/29/15  6:35 AM  Result Value Ref Range   Hgb A1c MFr Bld 6.8 (H) 4.8 - 5.6 %    Comment: (NOTE)         Pre-diabetes: 5.7 - 6.4         Diabetes: >6.4         Glycemic control for adults with diabetes: <7.0    Mean Plasma Glucose 148 mg/dL    Comment: (NOTE) Performed At: [facility] [place], [place] [id number] Lindon Romp MD [id number]   Renal function panel     Status: Abnormal   Collection Time: 07/29/15 10:09 AM  Result Value Ref Range   Sodium 137 135 - 145 mmol/L   Potassium 3.6 3.5 - 5.1 mmol/L   Chloride 100 (L) 101 - 111 mmol/L   CO2 29 22 - 32 mmol/L   Glucose, Bld 200 (H) 65 - 99 mg/dL   BUN <5 (L) 6 - 20 mg/dL   Creatinine, Ser 1.00 0.61 - 1.24 mg/dL   Calcium 8.3 (L) 8.9 - 10.3 mg/dL   Phosphorus 3.7 2.5 - 4.6 mg/dL   Albumin 3.4 (L) 3.5 - 5.0 g/dL   GFR calc non Af Amer >60 >60 mL/min   GFR calc Af Amer >60 >60 mL/min    Comment: (NOTE) The eGFR has been calculated using the CKD EPI equation. This calculation has not been validated in all clinical situations. eGFR's persistently <60 mL/min signify possible Chronic Kidney Disease.    Anion gap 8 5 - 15  Ethylene glycol     Status: None   Collection Time: 07/29/15 10:09 AM  Result Value Ref Range   Ethylene Glycol Lvl None Detected None detected mg/dL    Comment: (NOTE)                                Detection Limit = 5 Performed At: [facility] [place], [place] [id number] Lindon Romp MD [id number]   Basic metabolic panel     Status: Abnormal   Collection Time: 07/29/15 11:00 AM  Result Value Ref Range   Sodium 136 135 - 145 mmol/L   Potassium 4.3 3.5 - 5.1 mmol/L    Comment: HEMOLYSIS AT THIS LEVEL MAY AFFECT RESULT   Chloride 100 (L) 101 - 111 mmol/L   CO2 28 22 - 32 mmol/L  Glucose, Bld 225 (H) 65 - 99 mg/dL   BUN <5 (L) 6 - 20 mg/dL   Creatinine, Ser 1.05 0.61 - 1.24 mg/dL   Calcium 8.4 (L) 8.9 - 10.3 mg/dL   GFR calc non Af Amer >60 >60 mL/min   GFR calc Af Amer >60 >60 mL/min    Comment: (NOTE) The eGFR has been calculated using the CKD EPI equation. This calculation has not been validated in all clinical situations. eGFR's persistently <60 mL/min signify possible Chronic Kidney Disease.    Anion gap 8 5 -  15  Glucose, capillary     Status: Abnormal   Collection Time: 07/29/15  5:45 PM  Result Value Ref Range   Glucose-Capillary 169 (H) 65 - 99 mg/dL  Glucose, capillary     Status: Abnormal   Collection Time: 07/29/15  7:53 PM  Result Value Ref Range   Glucose-Capillary 185 (H) 65 - 99 mg/dL  Renal function panel     Status: Abnormal   Collection Time: 07/30/15  5:11 AM  Result Value Ref Range   Sodium 135 135 - 145 mmol/L   Potassium 3.7 3.5 - 5.1 mmol/L   Chloride 100 (L) 101 - 111 mmol/L   CO2 27 22 - 32 mmol/L   Glucose, Bld 149 (H) 65 - 99 mg/dL   BUN <5 (L) 6 - 20 mg/dL   Creatinine, Ser 1.04 0.61 - 1.24 mg/dL   Calcium 8.6 (L) 8.9 - 10.3 mg/dL   Phosphorus 4.2 2.5 - 4.6 mg/dL   Albumin 3.5 3.5 - 5.0 g/dL   GFR calc non Af Amer >60 >60 mL/min   GFR calc Af Amer >60 >60 mL/min    Comment: (NOTE) The eGFR has been calculated using the CKD EPI equation. This calculation has not been validated in all clinical situations. eGFR's persistently <60 mL/min signify possible Chronic Kidney Disease.    Anion gap 8 5 - 15  CBC     Status: Abnormal   Collection Time: 07/30/15  5:11 AM  Result Value Ref Range   WBC 4.9 4.0 - 10.5 K/uL   RBC 3.99 (L) 4.22 - 5.81 MIL/uL   Hemoglobin 13.0 13.0 - 17.0 g/dL   HCT 37.8 (L) 39.0 - 52.0 %   MCV 94.7 78.0 - 100.0 fL   MCH 32.6 26.0 - 34.0 pg   MCHC 34.4 30.0 - 36.0 g/dL   RDW 13.3 11.5 - 15.5 %   Platelets 120 (L) 150 - 400 K/uL  Glucose, capillary     Status: Abnormal   Collection Time: 07/30/15  8:06 AM  Result Value Ref Range   Glucose-Capillary 113 (H) 65 - 99 mg/dL    Current Facility-Administered Medications  Medication Dose Route Frequency Provider Last Rate Last Dose  . 0.9 %  sodium chloride infusion  250 mL Intravenous PRN Erick Colace, NP 10 mL/hr at 07/27/15 1242 250 mL at 07/27/15 1242  . 0.9 %  sodium chloride infusion  100 mL Intravenous PRN Roney Jaffe, MD      . 0.9 %  sodium chloride infusion  100 mL  Intravenous PRN Roney Jaffe, MD      . alteplase (CATHFLO ACTIVASE) injection 2 mg  2 mg Intracatheter Once PRN Roney Jaffe, MD      . arformoterol [facility]) nebulizer solution 15 mcg  15 mcg Nebulization Q12H Erick Colace, NP   15 mcg at 07/29/15 1935  . budesonide (PULMICORT) nebulizer solution 0.25 mg  0.25 mg Nebulization Q12H  Erick Colace, NP   0.25 mg at 07/29/15 1934  . clonazePAM (KLONOPIN) tablet 0.5 mg  0.5 mg Oral TID PRN Erick Colace, NP   0.5 mg at 07/29/15 2242  . clonazePAM (KLONOPIN) tablet 0.5 mg  0.5 mg Oral TID Reyne Dumas, MD   0.5 mg at 07/30/15 0947  . escitalopram (LEXAPRO) tablet 20 mg  20 mg Oral Daily Reyne Dumas, MD   20 mg at 07/30/15 0948  . fentaNYL (SUBLIMAZE) injection 12.5 mcg  12.5 mcg Intravenous Q2H PRN Erick Colace, NP   12.5 mcg at 07/29/15 2242  . finasteride (PROSCAR) tablet 5 mg  5 mg Oral Daily Erick Colace, NP   5 mg at 07/30/15 0947  . folic acid injection 1 mg  1 mg Intravenous Daily Erick Colace, NP   1 mg at 07/30/15 6294  . heparin 1000 unit/ml injection 1,000-6,000 Units  1,000-6,000 Units Dialysis PRN Raylene Miyamoto, MD      . heparin injection 1,000 Units  1,000 Units Dialysis PRN Roney Jaffe, MD      . heparin injection 2,000-4,000 Units  2,000-4,000 Units Dialysis Q2H PRN Roney Jaffe, MD      . heparin injection 5,000 Units  5,000 Units Subcutaneous 3 times per day Erick Colace, NP   5,000 Units at 07/30/15 0535  . hydrALAZINE (APRESOLINE) injection 10 mg  10 mg Intravenous Q6H PRN Reyne Dumas, MD   10 mg at 07/29/15 1828  . hydrALAZINE (APRESOLINE) injection 5 mg  5 mg Intravenous Once Jeryl Columbia, NP   5 mg at 07/30/15 0536  . Influenza vac split quadrivalent PF (FLUARIX) injection 0.5 mL  0.5 mL Intramuscular Tomorrow-1000 Raylene Miyamoto, MD   0.5 mL at 07/28/15 1209  . insulin aspart (novoLOG) injection 0-9 Units  0-9 Units Subcutaneous TID WC Reyne Dumas, MD   2 Units at 07/29/15 1809  .  insulin glargine (LANTUS) injection 20 Units  20 Units Subcutaneous Q2200 Reyne Dumas, MD   20 Units at 07/29/15 2102  . lactulose (CHRONULAC) 10 GM/15ML solution 20 g  20 g Oral Daily PRN Erick Colace, NP      . lidocaine (PF) (XYLOCAINE) 1 % injection 5 mL  5 mL Intradermal PRN Roney Jaffe, MD      . lidocaine-prilocaine (EMLA) cream 1 application  1 application Topical PRN Roney Jaffe, MD      . LORazepam (ATIVAN) tablet 1 mg  1 mg Oral Q6H PRN Virginia Crews, MD   1 mg at 07/30/15 0001   Or  . LORazepam (ATIVAN) injection 1 mg  1 mg Intravenous Q6H PRN Virginia Crews, MD   1 mg at 07/28/15 1937  . metoprolol tartrate (LOPRESSOR) tablet 25 mg  25 mg Oral BID Reyne Dumas, MD   25 mg at 07/30/15 0947  . multivitamin with minerals tablet 1 tablet  1 tablet Oral Daily Virginia Crews, MD   1 tablet at 07/30/15 0947  . oxyCODONE-acetaminophen (PERCOCET/ROXICET) 5-325 MG per tablet 1 tablet  1 tablet Oral Q6H PRN Rush Farmer, MD   1 tablet at 07/30/15 0947  . pantoprazole (PROTONIX) EC tablet 40 mg  40 mg Oral Daily Reyne Dumas, MD   40 mg at 07/29/15 1609  . pantoprazole (PROTONIX) injection 40 mg  40 mg Intravenous Q12H Erick Colace, NP   40 mg at 07/30/15 0947  . pentafluoroprop-tetrafluoroeth (GEBAUERS) aerosol 1 application  1 application Topical PRN  Roney Jaffe, MD      . pneumococcal 23 valent vaccine (PNU-IMMUNE) injection 0.5 mL  0.5 mL Intramuscular Tomorrow-1000 Raylene Miyamoto, MD   0.5 mL at 07/28/15 1210  . pyridOXINE (B-6) injection 100 mg  100 mg Intravenous Daily Erick Colace, NP   100 mg at 07/29/15 1035  . thiamine (VITAMIN B-1) tablet 100 mg  100 mg Oral Daily Virginia Crews, MD   100 mg at 07/30/15 4827   Or  . thiamine (B-1) injection 100 mg  100 mg Intravenous Daily Virginia Crews, MD        Musculoskeletal: Strength & Muscle Tone: within normal limits Gait & Station: normal Patient leans: N/A  Psychiatric Specialty  Exam: ROS   Blood pressure 130/54, pulse 70, temperature 97.8 F (36.6 C), temperature source Oral, resp. rate 19, height _0  (1.803 m), weight 104 kg (229 lb 4.5 oz), SpO2 98 %.Body mass index is 31.99 kg/(m^2).  General Appearance: Casual  Eye Contact::  Good  Speech:  Clear and Coherent  Volume:  Normal  Mood:  Euthymic  Affect:  Appropriate and Congruent  Thought Process:  Coherent and Goal Directed  Orientation:  Full (Time, Place, and Person)  Thought Content:  Paranoid Ideation and Rumination - minimum at this time  Suicidal Thoughts:  No  Homicidal Thoughts:  No  Memory:  Immediate;   Fair Recent;   Fair  Judgement:  Impaired  Insight:  Fair  Psychomotor Activity:  Normal  Concentration:  Fair  Recall:  [employer] of Knowledge:Fair  Language: Good  Akathisia:  Negative  Handed:  Right  AIMS (if indicated):     Assets:  Communication Skills Desire for Improvement Financial Resources/Insurance Housing Leisure Time Resilience Social Support Transportation  ADL's:  Intact  Cognition: WNL  Sleep:      Treatment Plan Summary: Daily contact with patient to assess and evaluate symptoms and progress in treatment and Medication management  Disposition: Continue his home psych medication and no psych medication changes during this hospital 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 Supportive therapy provided about ongoing stressors.  Patient will be referred to the outpatient medication management and act services and medically stable.   Theseus Birnie,JANARDHAHA R. 07/30/2015 10:03 AM

## 2015-07-30 NOTE — Progress Notes (Signed)
Patient ID: [name], male   DOB: 05-22-1961, 53 y.o.   MRN: [id number] S:feels better O:BP 147/80 mmHg  Pulse 93  Temp(Src) 98 F (36.7 C) (Oral)  Resp 18  Ht 5\' 11"  (1.803 m)  Wt 104 kg (229 lb 4.5 oz)  BMI 31.99 kg/m2  SpO2 100%  Intake/Output Summary (Last 24 hours) at 07/30/15 0857 Last data filed at 07/29/15 1809  Gross per 24 hour  Intake 1662.54 ml  Output      0 ml  Net 1662.54 ml   Intake/Output: I/O last 3 completed shifts: In: 2682.5 [P.O.:2280; I.V.:301.5; IV Piggyback:101] Out: 0   Intake/Output this shift:    Weight change:  Gen:WD WN WM in NAD CVS:no rub Resp:cta EBR:AXENMM HWK:GSUPJ edema   Recent Labs Lab 07/26/15 2050  07/27/15 1313 07/27/15 2317 07/28/15 0305 07/28/15 1047 07/28/15 2328 07/29/15 1009 07/29/15 1100 07/30/15 0511  NA 136  < > 137 135 139 139 134* 137 136 135  K 3.5  < > 5.0 4.4 3.5 3.8 3.4* 3.6 4.3 3.7  CL 102  < > 107 102 100* 101 98* 100* 100* 100*  CO2 23  < > 22 23 31 27 27 29 28 27   GLUCOSE 128*  < > 137* 144* 135* 200* 186* 200* 225* 149*  BUN 8  < > 7 <5* <5* <5* <5* <5* <5* <5*  CREATININE 1.07  < > 0.98 0.73 0.88 0.98 1.01 1.00 1.05 1.04  ALBUMIN 4.0  --  3.7  --   --   --   --  3.4*  --  3.5  CALCIUM 8.7*  < > 8.9 7.8* 7.9* 8.0* 8.3* 8.3* 8.4* 8.6*  PHOS  --   --  4.1  --  2.3*  --   --  3.7  --  4.2  AST 16  --  16  --   --   --   --   --   --   --   ALT 15*  --  15*  --   --   --   --   --   --   --   < > = values in this interval not displayed. Liver Function Tests:  Recent Labs Lab 07/26/15 2050 07/27/15 1313 07/29/15 1009 07/30/15 0511  AST 16 16  --   --   ALT 15* 15*  --   --   ALKPHOS 54 65  --   --   BILITOT 0.6 0.6  --   --   PROT 6.7 6.2*  --   --   ALBUMIN 4.0 3.7 3.4* 3.5    Recent Labs Lab 07/26/15 2050 07/27/15 1313  LIPASE 41 33   No results for input(s): AMMONIA in the last 168 hours. CBC:  Recent Labs Lab 07/26/15 2050  07/27/15 1313 07/28/15 0305 07/29/15 0635  07/30/15 0511  WBC 7.0  --  5.5 6.4 5.2 4.9  NEUTROABS 3.6  --   --   --   --   --   HGB 15.2  < > 14.5 13.6 13.7 13.0  HCT 42.5  < > 42.5 39.4 39.6 37.8*  MCV 94.2  --  95.7 94.7 94.3 94.7  PLT 152  --  140* 127* 130* 120*  < > = values in this interval not displayed. Cardiac Enzymes: No results for input(s): CKTOTAL, CKMB, CKMBINDEX, TROPONINI in the last 168 hours. CBG:  Recent Labs Lab 07/27/15 1105 07/29/15 1745 07/29/15 1953 07/30/15 0315  GLUCAP 144* 169* 185* 113*    Iron Studies: No results for input(s): IRON, TIBC, TRANSFERRIN, FERRITIN in the last 72 hours. Studies/Results: No results found. [name] arformoterol  15 mcg Nebulization Q12H  . budesonide (PULMICORT) nebulizer solution  0.25 mg Nebulization Q12H  . clonazePAM  0.5 mg Oral TID  . escitalopram  20 mg Oral Daily  . finasteride  5 mg Oral Daily  . folic acid  1 mg Intravenous Daily  . heparin  5,000 Units Subcutaneous 3 times per day  . hydrALAZINE  5 mg Intravenous Once  . Influenza vac split quadrivalent PF  0.5 mL Intramuscular Tomorrow-1000  . insulin aspart  0-9 Units Subcutaneous TID WC  . insulin glargine  20 Units Subcutaneous Q2200  . metoprolol tartrate  25 mg Oral BID  . multivitamin with minerals  1 tablet Oral Daily  . pantoprazole  40 mg Oral Daily  . pantoprazole (PROTONIX) IV  40 mg Intravenous Q12H  . pneumococcal 23 valent vaccine  0.5 mL Intramuscular Tomorrow-1000  . pyridOXINE  100 mg Intravenous Daily  . thiamine  100 mg Oral Daily   Or  . thiamine  100 mg Intravenous Daily    BMET    Component Value Date/Time   NA 135 07/30/2015 0511   K 3.7 07/30/2015 0511   CL 100* 07/30/2015 0511   CO2 27 07/30/2015 0511   GLUCOSE 149* 07/30/2015 0511   BUN <5* 07/30/2015 0511   CREATININE 1.04 07/30/2015 0511   CREATININE 1.19 02/06/2015 1712   CALCIUM 8.6* 07/30/2015 0511   GFRNONAA >60 07/30/2015 0511   GFRNONAA 69 02/06/2015 1712   GFRAA >60 07/30/2015 0511   GFRAA 80 02/06/2015  1712   CBC    Component Value Date/Time   WBC 4.9 07/30/2015 0511   RBC 3.99* 07/30/2015 0511   HGB 13.0 07/30/2015 0511   HCT 37.8* 07/30/2015 0511   PLT 120* 07/30/2015 0511   MCV 94.7 07/30/2015 0511   MCH 32.6 07/30/2015 0511   MCHC 34.4 07/30/2015 0511   RDW 13.3 07/30/2015 0511   LYMPHSABS 2.4 07/26/2015 2050   MONOABS 0.6 07/26/2015 2050   EOSABS 0.3 07/26/2015 2050   BASOSABS 0.1 07/26/2015 2050     Assessment/Plan:  1. 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 Good urine output and hemodynamically stable. Normal anion gap. Hold off on another session of hemodialysis until repeat ethylene glycol level is back from this morning but I suspect he will not need further HD. 1. No detectable ethylene glycol level.  2. No electrolyte abnormalities  3. Discontinue HD catheter, no need for further HD or follow up 4. Will sign off 2. Suicide attempt- psych eval cleared pt for return to group home. 3. HTN- ok to resume metoprolol, hold lisinopril and diuretics for now. 4. Schizophrenia- per Psych 5. Disposition- d/c to group home per [place]

## 2015-07-31 ENCOUNTER — Telehealth: Payer: Self-pay | Admitting: Family Medicine

## 2015-07-31 DIAGNOSIS — G8929 Other chronic pain: Secondary | ICD-10-CM

## 2015-07-31 DIAGNOSIS — M545 Low back pain: Principal | ICD-10-CM

## 2015-07-31 NOTE — Telephone Encounter (Signed)
Patient calls with severe back, legs, and hip pain. He would like to begin the pain management program. Please f/u with patient ASAP.

## 2015-07-31 NOTE — Telephone Encounter (Signed)
He is been referral to several places with no luck and the last one at [place] need to call [facility] because patient has medicaid ca . I called patient lvm to call me back  Thank you .

## 2015-07-31 NOTE — Telephone Encounter (Signed)
Patient called requesting a high dosage of tylenol 3.  Please follow up with patient

## 2015-07-31 NOTE — Telephone Encounter (Signed)
Patient called requesting to be prescribed 4 pills a day instead of 3 pills a day for his acetaminophen-codeine (TYLENOL #3) 300-30 MG tablet because of his pain levels. Please follow up with pt. Thank you.

## 2015-08-01 NOTE — Telephone Encounter (Signed)
Patient called stating that he is in a lot of pain and would like to speak to nurse.

## 2015-08-01 NOTE — Telephone Encounter (Signed)
Pt. Called requesting to go to a pain management because he is in a lot of pian. Pt. Also requested he would like 4 tylenol pills a day until he can be able to go to a pain clinic. Please f/u with pt.

## 2015-08-04 ENCOUNTER — Telehealth: Payer: Self-pay | Admitting: Clinical

## 2015-08-04 NOTE — Telephone Encounter (Signed)
Pt. Called requesting for PCP to send him to hospice because he is suffering mentally and physically. Please f/u with pt. ASAP

## 2015-08-04 NOTE — Telephone Encounter (Signed)
[name] requests referral to [facility]; if he does not hear from Hospice in three days (08/07/15), it is recommended that he call [facility] from [facility] at [phone] to find out the outcome of his request. [name] is agreeable to this, says "thank you for trying to help me".

## 2015-08-05 DIAGNOSIS — I10 Essential (primary) hypertension: Secondary | ICD-10-CM | POA: Diagnosis not present

## 2015-08-05 DIAGNOSIS — J449 Chronic obstructive pulmonary disease, unspecified: Secondary | ICD-10-CM | POA: Diagnosis not present

## 2015-08-05 DIAGNOSIS — E785 Hyperlipidemia, unspecified: Secondary | ICD-10-CM | POA: Diagnosis not present

## 2015-08-05 DIAGNOSIS — E119 Type 2 diabetes mellitus without complications: Secondary | ICD-10-CM | POA: Diagnosis not present

## 2015-08-06 NOTE — Telephone Encounter (Signed)
Pt. Called requesting for PCP to refer him to a pain management clinic because he is in a lot of pain all over his body. Please f/u with pt.

## 2015-08-07 NOTE — Telephone Encounter (Signed)
Pt. Called requesting for PCP to refer him to a pain management clinic because he is in a lot of pain all over his body. Please f/u with pt.

## 2015-08-08 ENCOUNTER — Telehealth: Payer: Self-pay | Admitting: Clinical

## 2015-08-08 ENCOUNTER — Encounter: Payer: Self-pay | Admitting: Clinical

## 2015-08-08 NOTE — Progress Notes (Signed)
[name] was referred to hospice Palliative Care, as requested by [name], on 08-08-15. Palliative Care called on 08-08-15  to say they are unable to take [name], and they recommend he receive outpatient services via [facility] in [place], [place].

## 2015-08-08 NOTE — Telephone Encounter (Signed)
Left HIPPA-compliant message to return call to [facility] from [facility] at [phone].

## 2015-08-11 NOTE — Telephone Encounter (Signed)
Pt has refills need to contact pharmacy for refills

## 2015-08-11 NOTE — Telephone Encounter (Signed)
Unable to contact pt Busy line  x2 

## 2015-08-14 NOTE — Telephone Encounter (Signed)
Pain management referral placed.

## 2015-08-20 ENCOUNTER — Telehealth: Payer: Self-pay | Admitting: Family Medicine

## 2015-08-20 DIAGNOSIS — M545 Low back pain, unspecified: Secondary | ICD-10-CM

## 2015-08-20 DIAGNOSIS — G8929 Other chronic pain: Secondary | ICD-10-CM

## 2015-08-20 NOTE — Telephone Encounter (Signed)
Patient called requesting a medication refill for acetaminophen-codeine (TYLENOL #3 and Gabapentin

## 2015-08-21 MED ORDER — ACETAMINOPHEN-CODEINE #3 300-30 MG PO TABS: 1.0000 | ORAL_TABLET | Freq: Three times a day (TID) | ORAL | Status: DC | PRN

## 2015-08-21 MED ORDER — GABAPENTIN 300 MG PO CAPS: 300.0000 mg | ORAL_CAPSULE | Freq: Three times a day (TID) | ORAL | Status: DC

## 2015-08-21 NOTE — Addendum Note (Signed)
Addended by: Boykin Nearing on: 08/21/2015 02:02 PM   Modules accepted: Orders

## 2015-08-21 NOTE — Telephone Encounter (Signed)
Tylenol #3 refilled and ready for pick up when appropriate. Based on my query of the [place] controlled substance database patient was due for refill on 08/19/15 He did get an rx for 30 pills from the ED but it does not appear that this was filled.  Gabapentin reordered at dose of 300 mg TID patient was up to 800 mg TID but this was stopped due lack of improvement in symptoms.

## 2015-08-21 NOTE — Telephone Encounter (Signed)
Rx tylenol #3 Fax to [facility]  Fax [phone]

## 2015-08-21 NOTE — Telephone Encounter (Signed)
Patient called nurse and left voicemail explaining he needed more Tylenl 3. Nurse called patient at number provided in voice mail message, [phone]. Lum Babe, nurse in charge, with [facility], answered telephone. Tadae informed nurse patient had Tylenol 3 and did not need any at this time.

## 2015-08-22 DIAGNOSIS — E876 Hypokalemia: Secondary | ICD-10-CM | POA: Diagnosis present

## 2015-08-22 DIAGNOSIS — R51 Headache: Secondary | ICD-10-CM | POA: Diagnosis not present

## 2015-08-22 DIAGNOSIS — E785 Hyperlipidemia, unspecified: Secondary | ICD-10-CM | POA: Diagnosis not present

## 2015-08-22 DIAGNOSIS — T65891A Toxic effect of other specified substances, accidental (unintentional), initial encounter: Secondary | ICD-10-CM | POA: Diagnosis not present

## 2015-08-22 DIAGNOSIS — L02214 Cutaneous abscess of groin: Secondary | ICD-10-CM | POA: Diagnosis not present

## 2015-08-22 DIAGNOSIS — Z6830 Body mass index (BMI) 30.0-30.9, adult: Secondary | ICD-10-CM | POA: Diagnosis not present

## 2015-08-22 DIAGNOSIS — F209 Schizophrenia, unspecified: Secondary | ICD-10-CM | POA: Diagnosis not present

## 2015-08-22 DIAGNOSIS — K439 Ventral hernia without obstruction or gangrene: Secondary | ICD-10-CM | POA: Diagnosis present

## 2015-08-22 DIAGNOSIS — N179 Acute kidney failure, unspecified: Secondary | ICD-10-CM | POA: Diagnosis not present

## 2015-08-22 DIAGNOSIS — F23 Brief psychotic disorder: Secondary | ICD-10-CM | POA: Diagnosis not present

## 2015-08-22 DIAGNOSIS — L02818 Cutaneous abscess of other sites: Secondary | ICD-10-CM | POA: Diagnosis not present

## 2015-08-22 DIAGNOSIS — F329 Major depressive disorder, single episode, unspecified: Secondary | ICD-10-CM | POA: Diagnosis not present

## 2015-08-22 DIAGNOSIS — L0390 Cellulitis, unspecified: Secondary | ICD-10-CM | POA: Diagnosis present

## 2015-08-22 DIAGNOSIS — T528X4A Toxic effect of other organic solvents, undetermined, initial encounter: Secondary | ICD-10-CM | POA: Diagnosis not present

## 2015-08-22 DIAGNOSIS — Z794 Long term (current) use of insulin: Secondary | ICD-10-CM | POA: Diagnosis not present

## 2015-08-22 DIAGNOSIS — Z915 Personal history of self-harm: Secondary | ICD-10-CM | POA: Diagnosis not present

## 2015-08-22 DIAGNOSIS — T528X4D Toxic effect of other organic solvents, undetermined, subsequent encounter: Secondary | ICD-10-CM | POA: Diagnosis not present

## 2015-08-22 DIAGNOSIS — F17210 Nicotine dependence, cigarettes, uncomplicated: Secondary | ICD-10-CM | POA: Diagnosis present

## 2015-08-22 DIAGNOSIS — Z79899 Other long term (current) drug therapy: Secondary | ICD-10-CM | POA: Diagnosis not present

## 2015-08-22 DIAGNOSIS — D473 Essential (hemorrhagic) thrombocythemia: Secondary | ICD-10-CM | POA: Diagnosis present

## 2015-08-22 DIAGNOSIS — Z7982 Long term (current) use of aspirin: Secondary | ICD-10-CM | POA: Diagnosis not present

## 2015-08-22 DIAGNOSIS — E119 Type 2 diabetes mellitus without complications: Secondary | ICD-10-CM | POA: Diagnosis not present

## 2015-08-22 DIAGNOSIS — F419 Anxiety disorder, unspecified: Secondary | ICD-10-CM | POA: Diagnosis not present

## 2015-08-22 DIAGNOSIS — K219 Gastro-esophageal reflux disease without esophagitis: Secondary | ICD-10-CM | POA: Diagnosis not present

## 2015-08-22 DIAGNOSIS — Z8249 Family history of ischemic heart disease and other diseases of the circulatory system: Secondary | ICD-10-CM | POA: Diagnosis not present

## 2015-08-22 DIAGNOSIS — F259 Schizoaffective disorder, unspecified: Secondary | ICD-10-CM | POA: Diagnosis not present

## 2015-08-22 DIAGNOSIS — T6594XA Toxic effect of unspecified substance, undetermined, initial encounter: Secondary | ICD-10-CM | POA: Diagnosis not present

## 2015-08-22 DIAGNOSIS — T65894A Toxic effect of other specified substances, undetermined, initial encounter: Secondary | ICD-10-CM | POA: Diagnosis not present

## 2015-08-22 DIAGNOSIS — Z608 Other problems related to social environment: Secondary | ICD-10-CM | POA: Diagnosis present

## 2015-08-22 DIAGNOSIS — T518X1A Toxic effect of other alcohols, accidental (unintentional), initial encounter: Secondary | ICD-10-CM | POA: Diagnosis present

## 2015-08-22 DIAGNOSIS — F200 Paranoid schizophrenia: Secondary | ICD-10-CM | POA: Diagnosis not present

## 2015-08-22 DIAGNOSIS — T528X1A Toxic effect of other organic solvents, accidental (unintentional), initial encounter: Secondary | ICD-10-CM | POA: Diagnosis not present

## 2015-08-29 ENCOUNTER — Telehealth: Payer: Self-pay | Admitting: Family Medicine

## 2015-08-29 DIAGNOSIS — E119 Type 2 diabetes mellitus without complications: Secondary | ICD-10-CM | POA: Diagnosis not present

## 2015-08-29 DIAGNOSIS — L02221 Furuncle of abdominal wall: Secondary | ICD-10-CM | POA: Diagnosis not present

## 2015-08-29 DIAGNOSIS — I10 Essential (primary) hypertension: Secondary | ICD-10-CM | POA: Diagnosis not present

## 2015-08-29 DIAGNOSIS — T528X1D Toxic effect of other organic solvents, accidental (unintentional), subsequent encounter: Secondary | ICD-10-CM | POA: Diagnosis not present

## 2015-08-29 DIAGNOSIS — F29 Unspecified psychosis not due to a substance or known physiological condition: Secondary | ICD-10-CM | POA: Diagnosis not present

## 2015-08-29 NOTE — Telephone Encounter (Signed)
[facility] called to let PCP know that they took the patient in for wound care because patient has a boil on his superpubic area.  For any questions call Myriam Jacobson at [phone]

## 2015-08-31 ENCOUNTER — Emergency Department (HOSPITAL_COMMUNITY)
Admission: EM | Admit: 2015-08-31 | Discharge: 2015-08-31 | Disposition: A | Discharge status: Home / Self Care | Payer: Medicare Other | Attending: Emergency Medicine | Admitting: Emergency Medicine

## 2015-08-31 ENCOUNTER — Encounter (HOSPITAL_COMMUNITY): Payer: Self-pay | Admitting: Cardiology

## 2015-08-31 ENCOUNTER — Emergency Department (HOSPITAL_COMMUNITY): Payer: Medicare Other

## 2015-08-31 DIAGNOSIS — I10 Essential (primary) hypertension: Secondary | ICD-10-CM | POA: Insufficient documentation

## 2015-08-31 DIAGNOSIS — F918 Other conduct disorders: Secondary | ICD-10-CM | POA: Diagnosis not present

## 2015-08-31 DIAGNOSIS — F17210 Nicotine dependence, cigarettes, uncomplicated: Secondary | ICD-10-CM | POA: Diagnosis not present

## 2015-08-31 DIAGNOSIS — Z79899 Other long term (current) drug therapy: Secondary | ICD-10-CM | POA: Diagnosis not present

## 2015-08-31 DIAGNOSIS — R0602 Shortness of breath: Secondary | ICD-10-CM | POA: Diagnosis not present

## 2015-08-31 DIAGNOSIS — Z794 Long term (current) use of insulin: Secondary | ICD-10-CM | POA: Insufficient documentation

## 2015-08-31 DIAGNOSIS — F259 Schizoaffective disorder, unspecified: Secondary | ICD-10-CM | POA: Diagnosis not present

## 2015-08-31 DIAGNOSIS — I252 Old myocardial infarction: Secondary | ICD-10-CM | POA: Insufficient documentation

## 2015-08-31 DIAGNOSIS — E119 Type 2 diabetes mellitus without complications: Secondary | ICD-10-CM | POA: Diagnosis not present

## 2015-08-31 DIAGNOSIS — R079 Chest pain, unspecified: Secondary | ICD-10-CM | POA: Diagnosis not present

## 2015-08-31 DIAGNOSIS — J449 Chronic obstructive pulmonary disease, unspecified: Secondary | ICD-10-CM | POA: Insufficient documentation

## 2015-08-31 DIAGNOSIS — E871 Hypo-osmolality and hyponatremia: Secondary | ICD-10-CM | POA: Insufficient documentation

## 2015-08-31 DIAGNOSIS — R0789 Other chest pain: Secondary | ICD-10-CM | POA: Diagnosis not present

## 2015-08-31 DIAGNOSIS — Z7982 Long term (current) use of aspirin: Secondary | ICD-10-CM | POA: Diagnosis not present

## 2015-08-31 DIAGNOSIS — F419 Anxiety disorder, unspecified: Secondary | ICD-10-CM | POA: Diagnosis not present

## 2015-08-31 LAB — COMPREHENSIVE METABOLIC PANEL
ALBUMIN: 4.4 g/dL (ref 3.5–5.0)
ALK PHOS: 70 U/L (ref 38–126)
ALT: 16 U/L — AB (ref 17–63)
ANION GAP: 9 (ref 5–15)
AST: 15 U/L (ref 15–41)
BILIRUBIN TOTAL: 0.7 mg/dL (ref 0.3–1.2)
BUN: 21 mg/dL — ABNORMAL HIGH (ref 6–20)
CALCIUM: 9.4 mg/dL (ref 8.9–10.3)
CO2: 25 mmol/L (ref 22–32)
CREATININE: 1.29 mg/dL — AB (ref 0.61–1.24)
Chloride: 96 mmol/L — ABNORMAL LOW (ref 101–111)
GFR calc Af Amer: >60 mL/min (ref >60)
GFR calc non Af Amer: >60 mL/min (ref >60)
GLUCOSE: 129 mg/dL — AB (ref 65–99)
Potassium: 3.7 mmol/L (ref 3.5–5.1)
SODIUM: 130 mmol/L — AB (ref 135–145)
TOTAL PROTEIN: 7.3 g/dL (ref 6.5–8.1)

## 2015-08-31 LAB — CBC
HEMATOCRIT: 41.2 % (ref 39.0–52.0)
HEMOGLOBIN: 15.5 g/dL (ref 13.0–17.0)
MCH: 34 pg (ref 26.0–34.0)
MCHC: 37.6 g/dL — AB (ref 30.0–36.0)
MCV: 90.4 fL (ref 78.0–100.0)
Platelets: 154 10*3/uL (ref 150–400)
RBC: 4.56 MIL/uL (ref 4.22–5.81)
RDW: 12.7 % (ref 11.5–15.5)
WBC: 8.2 10*3/uL (ref 4.0–10.5)

## 2015-08-31 LAB — APTT: APTT: 29 s (ref 24–37)

## 2015-08-31 LAB — PROTIME-INR
INR: 1.07 (ref 0.00–1.49)
Prothrombin Time: 14.1 seconds (ref 11.6–15.2)

## 2015-08-31 LAB — TROPONIN I: Troponin I: <0.03 ng/mL (ref <0.031)

## 2015-08-31 MED ORDER — SODIUM CHLORIDE 0.9 % IV SOLN
1000.0000 mL | INTRAVENOUS | Status: DC
  Administered 2015-08-31: 1000 mL via INTRAVENOUS

## 2015-08-31 MED ORDER — ASPIRIN 81 MG PO CHEW
324.0000 mg | CHEWABLE_TABLET | Freq: Once | ORAL | Status: AC
  Administered 2015-08-31: 324 mg via ORAL
  Filled 2015-08-31: qty 4

## 2015-08-31 MED ORDER — CLONAZEPAM 0.5 MG PO TABS
0.5000 mg | ORAL_TABLET | Freq: Three times a day (TID) | ORAL | Status: DC
  Administered 2015-08-31: 0.5 mg via ORAL
  Filled 2015-08-31: qty 1

## 2015-08-31 NOTE — Discharge Instructions (Signed)
Nonspecific Chest Pain  °Chest pain can be caused by many different conditions. There is always a chance that your pain could be related to something serious, such as a heart attack or a blood clot in your lungs. Chest pain can also be caused by conditions that are not life-threatening. If you have chest pain, it is very important to follow up with your health care provider. °CAUSES  °Chest pain can be caused by: °· Heartburn. °· Pneumonia or bronchitis. °· Anxiety or stress. °· Inflammation around your heart (pericarditis) or lung (pleuritis or pleurisy). °· A blood clot in your lung. °· A collapsed lung (pneumothorax). It can develop suddenly on its own (spontaneous pneumothorax) or from trauma to the chest. °· Shingles infection (varicella-zoster virus). °· Heart attack. °· Damage to the bones, muscles, and cartilage that make up your chest wall. This can include: °¨ Bruised bones due to injury. °¨ Strained muscles or cartilage due to frequent or repeated coughing or overwork. °¨ Fracture to one or more ribs. °¨ Sore cartilage due to inflammation (costochondritis). °RISK FACTORS  °Risk factors for chest pain may include: °· Activities that increase your risk for trauma or injury to your chest. °· Respiratory infections or conditions that cause frequent coughing. °· Medical conditions or overeating that can cause heartburn. °· Heart disease or family history of heart disease. °· Conditions or health behaviors that increase your risk of developing a blood clot. °· Having had chicken pox (varicella zoster). °SIGNS AND SYMPTOMS °Chest pain can feel like: °· Burning or tingling on the surface of your chest or deep in your chest. °· Crushing, pressure, aching, or squeezing pain. °· Dull or sharp pain that is worse when you move, cough, or take a deep breath. °· Pain that is also felt in your back, neck, shoulder, or arm, or pain that spreads to any of these areas. °Your chest pain may come and go, or it may stay  constant. °DIAGNOSIS °Lab tests or other studies may be needed to find the cause of your pain. Your health care provider may have you take a test called an ambulatory ECG (electrocardiogram). An ECG records your heartbeat patterns at the time the test is performed. You may also have other tests, such as: °· Transthoracic echocardiogram (TTE). During echocardiography, sound waves are used to create a picture of all of the heart structures and to look at how blood flows through your heart. °· Transesophageal echocardiogram (TEE). This is a more advanced imaging test that obtains images from inside your body. It allows your health care provider to see your heart in finer detail. °· Cardiac monitoring. This allows your health care provider to monitor your heart rate and rhythm in real time. °· Holter monitor. This is a portable device that records your heartbeat and can help to diagnose abnormal heartbeats. It allows your health care provider to track your heart activity for several days, if needed. °· Stress tests. These can be done through exercise or by taking medicine that makes your heart beat more quickly. °· Blood tests. °· Imaging tests. °TREATMENT  °Your treatment depends on what is causing your chest pain. Treatment may include: °· Medicines. These may include: °¨ Acid blockers for heartburn. °¨ Anti-inflammatory medicine. °¨ Pain medicine for inflammatory conditions. °¨ Antibiotic medicine, if an infection is present. °¨ Medicines to dissolve blood clots. °¨ Medicines to treat coronary artery disease. °· Supportive care for conditions that do not require medicines. This may include: °¨ Resting. °¨ Applying heat   or cold packs to injured areas. °¨ Limiting activities until pain decreases. °HOME CARE INSTRUCTIONS °· If you were prescribed an antibiotic medicine, finish it all even if you start to feel better. °· Avoid any activities that bring on chest pain. °· Do not use any tobacco products, including  cigarettes, chewing tobacco, or electronic cigarettes. If you need help quitting, ask your health care provider. °· Do not drink alcohol. °· Take medicines only as directed by your health care provider. °· Keep all follow-up visits as directed by your health care provider. This is important. This includes any further testing if your chest pain does not go away. °· If heartburn is the cause for your chest pain, you may be told to keep your head raised (elevated) while sleeping. This reduces the chance that acid will go from your stomach into your esophagus. °· Make lifestyle changes as directed by your health care provider. These may include: °¨ Getting regular exercise. Ask your health care provider to suggest some activities that are safe for you. °¨ Eating a heart-healthy diet. A registered dietitian can help you to learn healthy eating options. °¨ Maintaining a healthy weight. °¨ Managing diabetes, if necessary. °¨ Reducing stress. °SEEK MEDICAL CARE IF: °· Your chest pain does not go away after treatment. °· You have a rash with blisters on your chest. °· You have a fever. °SEEK IMMEDIATE MEDICAL CARE IF:  °· Your chest pain is worse. °· You have an increasing cough, or you cough up blood. °· You have severe abdominal pain. °· You have severe weakness. °· You faint. °· You have chills. °· You have sudden, unexplained chest discomfort. °· You have sudden, unexplained discomfort in your arms, back, neck, or jaw. °· You have shortness of breath at any time. °· You suddenly start to sweat, or your skin gets clammy. °· You feel nauseous or you vomit. °· You suddenly feel light-headed or dizzy. °· Your heart begins to beat quickly, or it feels like it is skipping beats. °These symptoms may represent a serious problem that is an emergency. Do not wait to see if the symptoms will go away. Get medical help right away. Call your local emergency services (911 in the [place] Do not drive yourself to the hospital. °  °This  information is not intended to replace advice given to you by your health care provider. Make sure you discuss any questions you have with your health care provider. °  °Document Released: 07/14/2005 Document Revised: 10/25/2014 Document Reviewed: 05/10/2014 °Elsevier Interactive Patient Education ©2016 Else[employer] Inc[employer] ° °
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
## 2015-08-31 NOTE — ED Notes (Signed)
Discharge instructions given, pt demonstrated teach back and verbal understanding. No concerns voiced.  

## 2015-09-03 DIAGNOSIS — T528X1D Toxic effect of other organic solvents, accidental (unintentional), subsequent encounter: Secondary | ICD-10-CM | POA: Diagnosis not present

## 2015-09-03 DIAGNOSIS — L02221 Furuncle of abdominal wall: Secondary | ICD-10-CM | POA: Diagnosis not present

## 2015-09-03 DIAGNOSIS — E119 Type 2 diabetes mellitus without complications: Secondary | ICD-10-CM | POA: Diagnosis not present

## 2015-09-03 DIAGNOSIS — J449 Chronic obstructive pulmonary disease, unspecified: Secondary | ICD-10-CM | POA: Diagnosis not present

## 2015-09-03 DIAGNOSIS — F29 Unspecified psychosis not due to a substance or known physiological condition: Secondary | ICD-10-CM | POA: Diagnosis not present

## 2015-09-03 DIAGNOSIS — F259 Schizoaffective disorder, unspecified: Secondary | ICD-10-CM | POA: Diagnosis not present

## 2015-09-03 DIAGNOSIS — I10 Essential (primary) hypertension: Secondary | ICD-10-CM | POA: Diagnosis not present

## 2015-09-05 DIAGNOSIS — T528X1D Toxic effect of other organic solvents, accidental (unintentional), subsequent encounter: Secondary | ICD-10-CM | POA: Diagnosis not present

## 2015-09-05 DIAGNOSIS — F29 Unspecified psychosis not due to a substance or known physiological condition: Secondary | ICD-10-CM | POA: Diagnosis not present

## 2015-09-05 DIAGNOSIS — L02221 Furuncle of abdominal wall: Secondary | ICD-10-CM | POA: Diagnosis not present

## 2015-09-05 DIAGNOSIS — I10 Essential (primary) hypertension: Secondary | ICD-10-CM | POA: Diagnosis not present

## 2015-09-05 DIAGNOSIS — E119 Type 2 diabetes mellitus without complications: Secondary | ICD-10-CM | POA: Diagnosis not present

## 2015-09-08 DIAGNOSIS — Z7982 Long term (current) use of aspirin: Secondary | ICD-10-CM | POA: Diagnosis not present

## 2015-09-08 DIAGNOSIS — Z7951 Long term (current) use of inhaled steroids: Secondary | ICD-10-CM | POA: Diagnosis not present

## 2015-09-08 DIAGNOSIS — M79605 Pain in left leg: Secondary | ICD-10-CM | POA: Diagnosis not present

## 2015-09-08 DIAGNOSIS — G8929 Other chronic pain: Secondary | ICD-10-CM | POA: Diagnosis not present

## 2015-09-08 DIAGNOSIS — Z794 Long term (current) use of insulin: Secondary | ICD-10-CM | POA: Diagnosis not present

## 2015-09-08 DIAGNOSIS — R509 Fever, unspecified: Secondary | ICD-10-CM | POA: Diagnosis not present

## 2015-09-08 DIAGNOSIS — J449 Chronic obstructive pulmonary disease, unspecified: Secondary | ICD-10-CM | POA: Diagnosis not present

## 2015-09-08 DIAGNOSIS — R1084 Generalized abdominal pain: Secondary | ICD-10-CM | POA: Diagnosis not present

## 2015-09-08 DIAGNOSIS — F17200 Nicotine dependence, unspecified, uncomplicated: Secondary | ICD-10-CM | POA: Diagnosis not present

## 2015-09-08 DIAGNOSIS — I10 Essential (primary) hypertension: Secondary | ICD-10-CM | POA: Diagnosis not present

## 2015-09-08 DIAGNOSIS — F259 Schizoaffective disorder, unspecified: Secondary | ICD-10-CM | POA: Diagnosis not present

## 2015-09-08 DIAGNOSIS — R109 Unspecified abdominal pain: Secondary | ICD-10-CM | POA: Diagnosis not present

## 2015-09-08 DIAGNOSIS — M549 Dorsalgia, unspecified: Secondary | ICD-10-CM | POA: Diagnosis not present

## 2015-09-08 DIAGNOSIS — E119 Type 2 diabetes mellitus without complications: Secondary | ICD-10-CM | POA: Diagnosis not present

## 2015-09-08 DIAGNOSIS — Z79899 Other long term (current) drug therapy: Secondary | ICD-10-CM | POA: Diagnosis not present

## 2015-09-08 DIAGNOSIS — M79604 Pain in right leg: Secondary | ICD-10-CM | POA: Diagnosis not present

## 2015-09-09 DIAGNOSIS — E785 Hyperlipidemia, unspecified: Secondary | ICD-10-CM | POA: Diagnosis not present

## 2015-09-09 DIAGNOSIS — Z79899 Other long term (current) drug therapy: Secondary | ICD-10-CM | POA: Diagnosis not present

## 2015-09-09 DIAGNOSIS — Z9114 Patient's other noncompliance with medication regimen: Secondary | ICD-10-CM | POA: Diagnosis not present

## 2015-09-09 DIAGNOSIS — F17200 Nicotine dependence, unspecified, uncomplicated: Secondary | ICD-10-CM | POA: Diagnosis not present

## 2015-09-09 DIAGNOSIS — R05 Cough: Secondary | ICD-10-CM | POA: Diagnosis not present

## 2015-09-09 DIAGNOSIS — F419 Anxiety disorder, unspecified: Secondary | ICD-10-CM | POA: Diagnosis not present

## 2015-09-09 DIAGNOSIS — G8929 Other chronic pain: Secondary | ICD-10-CM | POA: Diagnosis not present

## 2015-09-09 DIAGNOSIS — I10 Essential (primary) hypertension: Secondary | ICD-10-CM | POA: Diagnosis not present

## 2015-09-09 DIAGNOSIS — Z794 Long term (current) use of insulin: Secondary | ICD-10-CM | POA: Diagnosis not present

## 2015-09-09 DIAGNOSIS — R109 Unspecified abdominal pain: Secondary | ICD-10-CM | POA: Diagnosis not present

## 2015-09-09 DIAGNOSIS — E119 Type 2 diabetes mellitus without complications: Secondary | ICD-10-CM | POA: Diagnosis not present

## 2015-09-09 DIAGNOSIS — J449 Chronic obstructive pulmonary disease, unspecified: Secondary | ICD-10-CM | POA: Diagnosis not present

## 2015-09-09 DIAGNOSIS — R1084 Generalized abdominal pain: Secondary | ICD-10-CM | POA: Diagnosis not present

## 2015-09-09 DIAGNOSIS — Z7982 Long term (current) use of aspirin: Secondary | ICD-10-CM | POA: Diagnosis not present

## 2015-09-09 DIAGNOSIS — F23 Brief psychotic disorder: Secondary | ICD-10-CM | POA: Diagnosis not present

## 2015-09-09 DIAGNOSIS — R0602 Shortness of breath: Secondary | ICD-10-CM | POA: Diagnosis not present

## 2015-09-09 DIAGNOSIS — F259 Schizoaffective disorder, unspecified: Secondary | ICD-10-CM | POA: Diagnosis not present

## 2015-09-09 DIAGNOSIS — Z7951 Long term (current) use of inhaled steroids: Secondary | ICD-10-CM | POA: Diagnosis not present

## 2015-09-10 DIAGNOSIS — F29 Unspecified psychosis not due to a substance or known physiological condition: Secondary | ICD-10-CM | POA: Diagnosis not present

## 2015-09-10 DIAGNOSIS — L02221 Furuncle of abdominal wall: Secondary | ICD-10-CM | POA: Diagnosis not present

## 2015-09-10 DIAGNOSIS — I10 Essential (primary) hypertension: Secondary | ICD-10-CM | POA: Diagnosis not present

## 2015-09-10 DIAGNOSIS — E119 Type 2 diabetes mellitus without complications: Secondary | ICD-10-CM | POA: Diagnosis not present

## 2015-09-10 DIAGNOSIS — T528X1D Toxic effect of other organic solvents, accidental (unintentional), subsequent encounter: Secondary | ICD-10-CM | POA: Diagnosis not present

## 2015-09-12 ENCOUNTER — Encounter: Payer: Self-pay | Admitting: *Deleted

## 2015-09-12 ENCOUNTER — Emergency Department: Payer: Medicare Other

## 2015-09-12 ENCOUNTER — Emergency Department
Admission: EM | Admit: 2015-09-12 | Discharge: 2015-09-13 | Disposition: A | Discharge status: Other Acute Inpatient Hospital | Payer: Medicare Other | Attending: Emergency Medicine | Admitting: Emergency Medicine

## 2015-09-12 DIAGNOSIS — Z794 Long term (current) use of insulin: Secondary | ICD-10-CM | POA: Diagnosis not present

## 2015-09-12 DIAGNOSIS — I10 Essential (primary) hypertension: Secondary | ICD-10-CM | POA: Diagnosis not present

## 2015-09-12 DIAGNOSIS — N289 Disorder of kidney and ureter, unspecified: Secondary | ICD-10-CM | POA: Diagnosis not present

## 2015-09-12 DIAGNOSIS — Z7951 Long term (current) use of inhaled steroids: Secondary | ICD-10-CM | POA: Diagnosis not present

## 2015-09-12 DIAGNOSIS — F1010 Alcohol abuse, uncomplicated: Secondary | ICD-10-CM | POA: Diagnosis not present

## 2015-09-12 DIAGNOSIS — E119 Type 2 diabetes mellitus without complications: Secondary | ICD-10-CM | POA: Insufficient documentation

## 2015-09-12 DIAGNOSIS — F1110 Opioid abuse, uncomplicated: Secondary | ICD-10-CM | POA: Insufficient documentation

## 2015-09-12 DIAGNOSIS — R109 Unspecified abdominal pain: Secondary | ICD-10-CM | POA: Diagnosis not present

## 2015-09-12 DIAGNOSIS — Z79899 Other long term (current) drug therapy: Secondary | ICD-10-CM | POA: Insufficient documentation

## 2015-09-12 DIAGNOSIS — Z7982 Long term (current) use of aspirin: Secondary | ICD-10-CM | POA: Diagnosis not present

## 2015-09-12 DIAGNOSIS — F99 Mental disorder, not otherwise specified: Secondary | ICD-10-CM | POA: Diagnosis not present

## 2015-09-12 DIAGNOSIS — K469 Unspecified abdominal hernia without obstruction or gangrene: Secondary | ICD-10-CM | POA: Insufficient documentation

## 2015-09-12 DIAGNOSIS — F17210 Nicotine dependence, cigarettes, uncomplicated: Secondary | ICD-10-CM | POA: Insufficient documentation

## 2015-09-12 DIAGNOSIS — K5900 Constipation, unspecified: Secondary | ICD-10-CM | POA: Diagnosis not present

## 2015-09-12 DIAGNOSIS — E860 Dehydration: Secondary | ICD-10-CM | POA: Insufficient documentation

## 2015-09-12 DIAGNOSIS — R45851 Suicidal ideations: Secondary | ICD-10-CM | POA: Diagnosis not present

## 2015-09-12 DIAGNOSIS — F1310 Sedative, hypnotic or anxiolytic abuse, uncomplicated: Secondary | ICD-10-CM | POA: Diagnosis not present

## 2015-09-12 DIAGNOSIS — R1032 Left lower quadrant pain: Secondary | ICD-10-CM

## 2015-09-12 LAB — COMPREHENSIVE METABOLIC PANEL
ALT: 15 U/L — AB (ref 17–63)
AST: 15 U/L (ref 15–41)
Albumin: 4.1 g/dL (ref 3.5–5.0)
Alkaline Phosphatase: 58 U/L (ref 38–126)
Anion gap: 10 (ref 5–15)
BUN: 20 mg/dL (ref 6–20)
CHLORIDE: 95 mmol/L — AB (ref 101–111)
CO2: 22 mmol/L (ref 22–32)
CREATININE: 1.61 mg/dL — AB (ref 0.61–1.24)
Calcium: 8.9 mg/dL (ref 8.9–10.3)
GFR calc Af Amer: 54 mL/min — ABNORMAL LOW (ref >60)
GFR, EST NON AFRICAN AMERICAN: 47 mL/min — AB (ref >60)
Glucose, Bld: 90 mg/dL (ref 65–99)
POTASSIUM: 3.7 mmol/L (ref 3.5–5.1)
SODIUM: 127 mmol/L — AB (ref 135–145)
Total Bilirubin: 0.6 mg/dL (ref 0.3–1.2)
Total Protein: 6.8 g/dL (ref 6.5–8.1)

## 2015-09-12 LAB — URINALYSIS COMPLETE WITH MICROSCOPIC (ARMC ONLY)
Bacteria, UA: NONE SEEN
Bilirubin Urine: NEGATIVE
Glucose, UA: NEGATIVE mg/dL
Hgb urine dipstick: NEGATIVE
KETONES UR: NEGATIVE mg/dL
LEUKOCYTES UA: NEGATIVE
Nitrite: NEGATIVE
PH: 5 (ref 5.0–8.0)
PROTEIN: NEGATIVE mg/dL
RBC / HPF: NONE SEEN RBC/hpf (ref 0–5)
SPECIFIC GRAVITY, URINE: 1.009 (ref 1.005–1.030)
WBC UA: NONE SEEN WBC/hpf (ref 0–5)

## 2015-09-12 LAB — CBC WITH DIFFERENTIAL/PLATELET
BASOS ABS: 0.1 10*3/uL (ref 0–0.1)
EOS ABS: 0.2 10*3/uL (ref 0–0.7)
HCT: 38.3 % — ABNORMAL LOW (ref 40.0–52.0)
Hemoglobin: 13.9 g/dL (ref 13.0–18.0)
Lymphocytes Relative: 33 %
Lymphs Abs: 2.2 10*3/uL (ref 1.0–3.6)
MCH: 32.7 pg (ref 26.0–34.0)
MCHC: 36.3 g/dL — ABNORMAL HIGH (ref 32.0–36.0)
MCV: 90 fL (ref 80.0–100.0)
MONO ABS: 0.5 10*3/uL (ref 0.2–1.0)
Monocytes Relative: 8 %
Neutro Abs: 3.8 10*3/uL (ref 1.4–6.5)
Neutrophils Relative %: 55 %
PLATELETS: 147 10*3/uL — AB (ref 150–440)
RBC: 4.25 MIL/uL — AB (ref 4.40–5.90)
RDW: 13.2 % (ref 11.5–14.5)
WBC: 6.9 10*3/uL (ref 3.8–10.6)

## 2015-09-12 LAB — URINE DRUG SCREEN, QUALITATIVE (ARMC ONLY)
Amphetamines, Ur Screen: NOT DETECTED
BENZODIAZEPINE, UR SCRN: NOT DETECTED
Barbiturates, Ur Screen: NOT DETECTED
CANNABINOID 50 NG, UR ~~LOC~~: NOT DETECTED
Cocaine Metabolite,Ur ~~LOC~~: NOT DETECTED
MDMA (Ecstasy)Ur Screen: NOT DETECTED
Methadone Scn, Ur: NOT DETECTED
Opiate, Ur Screen: POSITIVE — AB
PHENCYCLIDINE (PCP) UR S: NOT DETECTED
TRICYCLIC, UR SCREEN: POSITIVE — AB

## 2015-09-12 LAB — SALICYLATE LEVEL: Salicylate Lvl: <4 mg/dL (ref 2.8–30.0)

## 2015-09-12 LAB — LIPASE, BLOOD: LIPASE: 35 U/L (ref 11–51)

## 2015-09-12 LAB — ETHANOL

## 2015-09-12 LAB — ACETAMINOPHEN LEVEL: Acetaminophen (Tylenol), Serum: <10 ug/mL — ABNORMAL LOW (ref 10–30)

## 2015-09-12 MED ORDER — LORAZEPAM 2 MG/ML IJ SOLN
1.0000 mg | Freq: Once | INTRAMUSCULAR | Status: AC
  Administered 2015-09-12: 1 mg via INTRAVENOUS
  Filled 2015-09-12: qty 1

## 2015-09-12 NOTE — ED Notes (Addendum)
Pt reports he put a sheet around his neck last week and wanted to hang himself but did not.  Pt is from a group home.  Noncompliant with medicines.  Pt reports that he is having a nervous breakdown.  Pt is calm and cooperative at this time.  Alert.

## 2015-09-12 NOTE — BH Assessment (Signed)
Assessment Note  [name] is an 54 y.o. male who presents to the ER from [place], [name]. Patient came to the ER via EMS after calling them. He states he's tried to hang himself on last week. "I tied the sheet around the tree but I couldn't bring myself to do it."  Patient continues to voice SI but currently have no plans. Patient is requesting to be admitted to admitted to [facility] as a means to adjust his medications.  Patient is currently an open client with [name] and is on their ACT Team. He's under the care of [name] and see"s him once a month. Patient is known to be paranoid, even at base line.  He has history of being non-compliant with his medication. He often reports of having problems with pain, in order to obtained narcotic medication.   Diagnosis: Schizoaffective D/O  Past Medical History:  Past Medical History  Diagnosis Date  . Diabetes mellitus without complication ([place]) [id number]  . Hypertension 2008  . Schizoaffective disorder ([place]) 2006   . COPD (chronic obstructive pulmonary disease) ([place])   . Alcohol dependence ([place]) 1979    stated abusing ETOH at age 38   . Benzodiazepine dependence ([place])   . Cocaine abuse   . MI (myocardial infarction) ([place])   . Cardiac arrest [facility])     Past Surgical History  Procedure Laterality Date  . Cyst excision      Family History:  Family History  Problem Relation Age of Onset  . Heart disease Mother   . Heart disease Father   . Heart disease Maternal Grandmother   . Diabetes Maternal Grandmother     Social History:  reports that he has been smoking Cigarettes.  He has been smoking about 1.50 packs per day. He does not have any smokeless tobacco history on file. He reports that he does not drink alcohol or use illicit drugs.  Additional Social History:  Alcohol / Drug Use Pain Medications: See PTA Prescriptions: See PTA Over the Counter: See PTA History of alcohol / drug use?:  Yes Longest period of sobriety (when/how long): 4 to 5 years Negative Consequences of Use: Personal relationships Withdrawal Symptoms:  (None Reported) Substance #1 Name of Substance 1: Alcohol 1 - Age of First Use: Unknown 1 - Amount (size/oz): 1-40oz 1 - Frequency: "Once a month." 1 - Duration: "For years." 1 - Last Use / Amount: 09/11/2015  CIWA: CIWA-Ar BP: 112/86 mmHg Pulse Rate: 88 COWS:    Allergies:  Allergies  Allergen Reactions  . Codeine Itching  . Haldol [Haloperidol Lactate] Other (See Comments)    Nightmares   . Seroquel [Quetiapine Fumarate] Other (See Comments)    Nightmares   . Trazodone And Nefazodone Other (See Comments)    Nightmares     Home Medications:  (Not in a hospital admission)  OB/GYN Status:  No LMP for male patient.  General Assessment Data Location of Assessment: [facility] ED TTS Assessment: In system Is this a Tele or Face-to-Face Assessment?: Face-to-Face Is this an Initial Assessment or a Re-assessment for this encounter?: Initial Assessment Marital status: Divorced [name] name: n/a Is patient pregnant?: No Pregnancy Status: No Living Arrangements: Group Home [facility]) Can pt return to current living arrangement?: Yes Admission Status: Voluntary Is patient capable of signing voluntary admission?: Yes Referral Source: Self/Family/Friend Insurance type: Medicare & Medicaid  Medical Screening Exam ([place]) Medical Exam completed: Yes  Crisis  Care Plan Living Arrangements: Group Home [facility]) Name of Psychiatrist: Dr. Jeanell Sparrow ([place]) Name of Therapist: [facility]  Education Status Is patient currently in school?: No Current Grade: n/a Highest grade of school patient has completed: 8th Grade Name of school: n/a Contact person: n/a  Risk to self with the past 6 months Suicidal Ideation: Yes-Currently Present Has patient been a risk to self within the past 6  months prior to admission? : Yes Suicidal Intent: Yes-Currently Present Has patient had any suicidal intent within the past 6 months prior to admission? : Yes Is patient at risk for suicide?: Yes Suicidal Plan?: Yes-Currently Present Has patient had any suicidal plan within the past 6 months prior to admission? : Yes Specify Current Suicidal Plan: Tried to hang self two weeks ago Access to Means: Yes Specify Access to Suicidal Means: Have sheets on his bed What has been your use of drugs/alcohol within the last 12 months?: Alcohol Previous Attempts/Gestures: Yes How many times?: 100 ("Lord I can't count, probably a 100" ) Other Self Harm Risks: None Noted Triggers for Past Attempts: Unpredictable Intentional Self Injurious Behavior: None Family Suicide History: No Recent stressful life event(s): Other (Comment) (History of non-compliance) Persecutory voices/beliefs?: No Depression: Yes Depression Symptoms: Feeling worthless/self pity Substance abuse history and/or treatment for substance abuse?: Yes Suicide prevention information given to non-admitted patients: Yes  Risk to Others within the past 6 months Homicidal Ideation: No Does patient have any lifetime risk of violence toward others beyond the six months prior to admission? : No Thoughts of Harm to Others: No Current Homicidal Intent: No Current Homicidal Plan: No Access to Homicidal Means: No Identified Victim: None Reported History of harm to others?: No Assessment of Violence: None Noted Violent Behavior Description: None Reported Does patient have access to weapons?: No Criminal Charges Pending?: No Does patient have a court date: No Is patient on probation?: No  Psychosis Hallucinations: None noted Delusions: Persecutory (Paranoid)  Mental Status Report Appearance/Hygiene: In scrubs, Unremarkable, In hospital gown Eye Contact: Good Motor Activity: Unremarkable, Freedom of movement Speech: Logical/coherent,  Unremarkable Level of Consciousness: Alert Mood: Anxious, Depressed, Sad, Pleasant Affect: Appropriate to circumstance, Anxious, Sad, Irritable Anxiety Level: Minimal Thought Processes: Coherent, Relevant Judgement: Unimpaired Orientation: Person, Place, Time, Situation, Appropriate for developmental age Obsessive Compulsive Thoughts/Behaviors: Minimal  Cognitive Functioning Concentration: Normal Memory: Recent Intact, Remote Intact IQ: Average Insight: Poor Impulse Control: Poor Appetite: Good Weight Loss: 0 Weight Gain: 0 Sleep: No Change Total Hours of Sleep: 6 Vegetative Symptoms: None  ADLScreening [facility] Assessment Services) Patient's cognitive ability adequate to safely complete daily activities?: Yes Patient able to express need for assistance with ADLs?: Yes Independently performs ADLs?: Yes (appropriate for developmental age)  Prior Inpatient Therapy Prior Inpatient Therapy: Yes Prior Therapy Dates: Unknown (Unknown) Prior Therapy Facilty/Provider(s): Unknown Reason for Treatment: Unknown  Prior Outpatient Therapy Prior Outpatient Therapy: Yes Prior Therapy Dates: Current Prior Therapy Facilty/Provider(s): [facility] Reason for Treatment: History Schizoaffective Does patient have an ACCT team?: Yes Does patient have Intensive In-House Services?  : No Does patient have [facility] services? : No Does patient have P4CC services?: No  ADL Screening (condition at time of admission) Patient's cognitive ability adequate to safely complete daily activities?: Yes Is the patient deaf or have difficulty hearing?: No Does the patient have difficulty seeing, even when wearing glasses/contacts?: No Does the patient have difficulty concentrating, remembering, or making decisions?: No Patient able to express need for  assistance with ADLs?: Yes Does the patient have difficulty dressing or bathing?: No Independently performs ADLs?: Yes (appropriate for  developmental age) Does the patient have difficulty walking or climbing stairs?: No Weakness of Legs: None Weakness of Arms/Hands: None  Home Assistive Devices/Equipment Home Assistive Devices/Equipment: None  Therapy Consults (therapy consults require a physician order) PT Evaluation Needed: No OT Evalulation Needed: No SLP Evaluation Needed: No Abuse/Neglect Assessment (Assessment to be complete while patient is alone) Physical Abuse: Denies Verbal Abuse: Denies Sexual Abuse: Denies Exploitation of patient/patient's resources: Denies Values / Beliefs Cultural Requests During Hospitalization: None Spiritual Requests During Hospitalization: None Consults Spiritual Care Consult Needed: No Social Work Consult Needed: No      Additional Information 1:1 In Past 12 Months?: No CIRT Risk: No Elopement Risk: No Does patient have medical clearance?: Yes  Child/Adolescent Assessment Running Away Risk: Denies (Patient is an adult)  Disposition:  Disposition Initial Assessment Completed for this Encounter: Yes Disposition of Patient: Other dispositions (Pysch MD to see) Other disposition(s): Other (Comment) (Pysch MD to see)  On Site Evaluation by:   Reviewed with Physician:     Gunnar Fusi, MS, LCAS, LPC, [place], CCSI 09/12/2015 6:00 PM

## 2015-09-12 NOTE — ED Notes (Signed)
Blanket given to pt per dr Marcelene Butte.

## 2015-09-12 NOTE — ED Notes (Signed)
Pt eating dinner tray °

## 2015-09-12 NOTE — ED Notes (Signed)
Iv started and meds given.  Pt calm and coopertaive.

## 2015-09-12 NOTE — ED Notes (Addendum)
Pt to ED via [facility] from [facility] due to SI x 4 months. Per EMS family center called [facility] today because pt was threatening to harm self, with no specific plan, pt states "I was having a nervous breakdown" Pt hx of schizoprenia, per staff at home, non compliant with medications. Pt's med list placed on chart at this time.

## 2015-09-12 NOTE — ED Notes (Signed)
MD at bedside. 

## 2015-09-12 NOTE — ED Provider Notes (Signed)
Time Seen: Approximately ----------------------------------------- 5:54 PM on 09/12/2015 -----------------------------------------    I have reviewed the triage notes  Chief Complaint: Suicidal   History of Present Illness: [name] is a 54 y.o. male who presents with suicidal ideation. Patient states he's had suicidal thoughts intermittently since September. Patient thought of hanging himself couple of days ago and actually wrapped a sheet around his neck. He states he was able to "talk himself out of a "". Patient states he still has suicidal thoughts and plan is to hang himself. He denies any history of substance abuse and states that he drank some alcohol yesterday "" a couple "". He states he smokes a pack of cigarettes per day denies any illicit drug usage. Patient has a long history of substance abuse and multiple medical problems including diabetes, hypertension, COPD, previous alcohol dependence, substance abuse, etc.   Past Medical History  Diagnosis Date  . Diabetes mellitus without complication ([place]) [id number]  . Hypertension 2008  . Schizoaffective disorder ([place]) 2006   . COPD (chronic obstructive pulmonary disease) ([place])   . Alcohol dependence ([place]) 1979    stated abusing ETOH at age 75   . Benzodiazepine dependence ([place])   . Cocaine abuse   . MI (myocardial infarction) ([place])   . Cardiac arrest [facility])     Patient Active Problem List   Diagnosis Date Noted  . AKI (acute kidney injury) ([place])   . Overdose 07/27/2015  . Encounter for central line placement   . Ethylene glycol poisoning   . Chest pain 05/26/2015  . Healthcare maintenance 02/10/2015  . Constipation 02/06/2015  . BPH (benign prostatic hyperplasia) 01/16/2015  . Bilateral leg edema 01/16/2015  . Vitamin D deficiency 01/16/2015  . Allergic rhinitis 11/19/2014  . Chronic low back pain 11/07/2014  . Onychomycosis of toenail 11/07/2014  . Tinea pedis 11/07/2014  . Alcohol abuse 10/17/2014  .  Psoriasis 10/14/2014  . HTN (hypertension) 10/14/2014  . Diabetes mellitus type 2, controlled ([place]) 10/14/2014  . COPD (chronic obstructive pulmonary disease) ([place]) 10/14/2014  . Tobacco abuse 10/14/2014  . HLD (hyperlipidemia) 10/14/2014  . Schizoaffective disorder ([place]) 10/14/2014    Past Surgical History  Procedure Laterality Date  . Cyst excision      Past Surgical History  Procedure Laterality Date  . Cyst excision      Current Outpatient Rx  Name  Route  Sig  Dispense  Refill  . acetaminophen-codeine (TYLENOL #3) 300-30 MG tablet   Oral   Take 1 tablet by mouth every 8 (eight) hours as needed for moderate pain.   90 tablet   1   . albuterol (PROVENTIL HFA;VENTOLIN HFA) 108 (90 BASE) MCG/ACT inhaler   Inhalation   Inhale 2 puffs into the lungs every 6 (six) hours as needed for wheezing or shortness of breath. Patient taking differently: Inhale 2 puffs into the lungs every 6 (six) hours as needed for wheezing or shortness of breath. Pt has ProAir inhaler   1 Inhaler   5   . amitriptyline (ELAVIL) 25 MG tablet   Oral   Take 25 mg by mouth at bedtime.         [name] arformoterol (BROVANA) 15 MCG/2ML NEBU   Nebulization   Take 2 mLs (15 mcg total) by nebulization every 12 (twelve) hours.   120 mL   1   . aspirin EC 81 MG tablet   Oral   Take 81 mg by mouth daily.         [name]  atorvastatin (LIPITOR) 40 MG tablet   Oral   Take 1 tablet (40 mg total) by mouth daily. Patient taking differently: Take 40 mg by mouth at bedtime.    90 tablet   3   . betamethasone dipropionate (DIPROLENE) 0.05 % ointment   Topical   Apply topically 2 (two) times daily. On hands   50 g   1   . cetirizine (ZYRTEC) 10 MG tablet   Oral   Take 1 tablet (10 mg total) by mouth daily.   30 tablet   11   . clonazePAM (KLONOPIN) 0.5 MG tablet   Oral   Take 1 tablet (0.5 mg total) by mouth 3 (three) times daily.   60 tablet   0   . Dextromethorphan-Guaifenesin (CORICIDIN HBP  CONGESTION/COUGH) 10-200 MG CAPS   Oral   Take 1-2 capsules by mouth 3 (three) times daily as needed (cough or congeston).   90 each   6   . escitalopram (LEXAPRO) 20 MG tablet   Oral   Take 1 tablet (20 mg total) by mouth daily.   90 tablet   1   . finasteride (PROSCAR) 5 MG tablet   Oral   Take 1 tablet (5 mg total) by mouth daily.   30 tablet   5   . Fluticasone-Salmeterol (ADVAIR) 100-50 MCG/DOSE AEPB   Inhalation   Inhale 1 puff into the lungs 2 (two) times daily.   1 each   5   . gabapentin (NEURONTIN) 300 MG capsule   Oral   Take 1 capsule (300 mg total) by mouth 3 (three) times daily.   90 capsule   1   . insulin aspart (NOVOLOG FLEXPEN) 100 UNIT/ML FlexPen   Subcutaneous   Inject 15-30 Units into the skin 3 (three) times daily with meals. Inject 15 units subcutaneously before each meal plus ss: CBG 150-200 17 units, 201-300 19 units, 301-400 23 units, 401-500 27 units, >500 30 units and call MD         . insulin glargine (LANTUS) 100 UNIT/ML injection   Subcutaneous   Inject 0.3 mLs (30 Units total) into the skin daily at 10 pm.   10 mL   11   . isosorbide mononitrate (IMDUR) 30 MG 24 hr tablet   Oral   Take 30 mg by mouth daily.         [name] ketoconazole (NIZORAL) 2 % cream   Topical   Apply 1 application topically 2 (two) times daily as needed for irritation. Patient taking differently: Apply 1 application topically 2 (two) times daily.    60 g   1   . lactulose (CHRONULAC) 10 GM/15ML solution   Oral   Take 30 mLs (20 g total) by mouth daily as needed for mild constipation.   240 mL   0   . lisinopril (PRINIVIL,ZESTRIL) 10 MG tablet   Oral   Take 10 mg by mouth daily.         [name] loratadine (CLARITIN) 10 MG tablet   Oral   Take 10 mg by mouth daily.         [name] lubiprostone (AMITIZA) 24 MCG capsule   Oral   Take 24 mcg by mouth daily.          . Magnesium Hydroxide (MILK OF MAGNESIA PO)   Oral   Take 30 mLs by mouth daily as needed  (indigestion).         . metoprolol tartrate (LOPRESSOR) 25 MG tablet  Oral   Take 1 tablet (25 mg total) by mouth 2 (two) times daily.   60 tablet   2   . mometasone (NASONEX) 50 MCG/ACT nasal spray      2 prays into each nostril daily   17 g   12   . NIFEdipine (ADALAT CC) 90 MG 24 hr tablet   Oral   Take 90 mg by mouth daily.         [name] omeprazole (PRILOSEC) 20 MG capsule   Oral   Take 20 mg by mouth daily.         . Paliperidone Palmitate 234 MG/1.5ML SUSP   Intramuscular   Inject 234 mg into the muscle every 28 (twenty-eight) days.         [name] thiamine 100 MG tablet   Oral   Take 1 tablet (100 mg total) by mouth daily.   30 tablet   0   . torsemide (DEMADEX) 20 MG tablet   Oral   Take 1 tablet (20 mg total) by mouth 2 (two) times daily.   60 tablet   0   . Vitamin D, Ergocalciferol, (DRISDOL) 50000 UNITS CAPS capsule   Oral   Take 50,000 Units by mouth every Wednesday.            Allergies:  Codeine; Haldol; Seroquel; and Trazodone and nefazodone  Family History: Family History  Problem Relation Age of Onset  . Heart disease Mother   . Heart disease Father   . Heart disease Maternal Grandmother   . Diabetes Maternal Grandmother     Social History: Social History  Substance Use Topics  . Smoking status: Current Every Day Smoker -- 1.50 packs/day    Types: Cigarettes  . Smokeless tobacco: None  . Alcohol Use: No     Review of Systems:   10 point review of systems was performed and was otherwise negative:  Constitutional: No fever Eyes: No visual disturbances ENT: No sore throat, ear pain Cardiac: No chest pain Respiratory: No shortness of breath, wheezing, or stridor Abdomen: Patient has some mild intermittent left sided abdominal pain with occasional pain into the left flank area. He denies any vomiting or diarrhea. Endocrine: No weight loss, No night sweats Extremities: No peripheral edema, cyanosis Skin: No rashes, easy  bruising Neurologic: No focal weakness, trouble with speech or swollowing Urologic: No dysuria, Hematuria, or urinary frequency   Physical Exam:  ED Triage Vitals  Enc Vitals Group     BP 09/12/15 1708 112/86 mmHg     Pulse Rate 09/12/15 1708 88     Resp 09/12/15 1708 19     Temp 09/12/15 1708 98.5 F (36.9 C)     Temp Source 09/12/15 1708 Oral     SpO2 09/12/15 1708 95 %     Weight 09/12/15 1708 240 lb (108.863 kg)     Height 09/12/15 1708 6' (1.829 m)     Head Cir --      Peak Flow --      Pain Score 09/12/15 1709 0     Pain Loc --      Pain Edu? --      Excl. in [place]? --     General: Awake , Alert , and Oriented times 3; GCS 15 Head: Normal cephalic , atraumatic Eyes: Pupils equal , round, reactive to light Nose/Throat: No nasal drainage, patent upper airway without erythema or exudate.  Neck: Supple, Full range of motion, No anterior adenopathy or palpable thyroid  masses Lungs: Clear to ascultation without wheezes , rhonchi, or rales Heart: Regular rate, regular rhythm without murmurs , gallops , or rubs Abdomen: Large abdominal hernia without any peritoneal signs no rebound guarding or rigidity no palpable masses without rebound, guarding , or rigidity; bowel sounds positive and symmetric in all 4 quadrants. No organomegaly .        Extremities: 2 plus symmetric pulses. No edema, clubbing or cyanosis Neurologic: normal ambulation, Motor symmetric without deficits, sensory intact Skin: warm, dry, no rashes   Labs:   All laboratory work was reviewed including any pertinent negatives or positives listed below:  [place] ([place]) - Abnormal; Notable for the following:    Color, Urine STRAW (*)    APPearance CLEAR (*)    Squamous Epithelial / LPF 0-5 (*)    All other components within normal limits  CBC WITH DIFFERENTIAL/PLATELET  COMPREHENSIVE METABOLIC PANEL  LIPASE, BLOOD  URINE DRUG SCREEN, QUALITATIVE ([place])   ETHANOL  ACETAMINOPHEN LEVEL  SALICYLATE LEVEL   laboratory work was reviewed and the patient has some mildly low sodium and an elevated creatinine levels indicative of some dehydration. He is able drink fluids and was given some mild oral fluids here in emergency department. Urine drug screen was positive for opiates and tricyclics.  EKG: * ED ECG REPORT I, Daymon Larsen, the attending physician, personally viewed and interpreted this ECG.  Date: 09/12/2015 EKG Time: 2049 Rate: 75 Rhythm: normal sinus rhythm QRS Axis: Left axis deviation Intervals: normal ST/T Wave abnormalities: normal Conduction Disutrbances: none Narrative Interpretation: unremarkable No acute ischemic changes are noted   Radiology:   CT ABDOMEN AND PELVIS WITHOUT CONTRAST  TECHNIQUE: Multidetector CT imaging of the abdomen and pelvis was performed following the standard protocol without IV contrast.  COMPARISON: 04/29/2015  FINDINGS: Sagittal images of the spine are unremarkable. Unenhanced liver shows no biliary ductal dilatation. No calcified gallstones are noted within gallbladder. Unenhanced pancreas, spleen and adrenal glands are unremarkable. Unenhanced kidneys are symmetrical in size. No hydronephrosis or hydroureter. Bilateral renal vascular calcifications are unchanged from prior exam. A cyst in upper pole of the right kidney measures 1.4 cm stable in size in appearance from prior exam. No calcified ureteral calculi are noted bilaterally.  Abundant stool noted in right colon and transverse colon. Moderate stool noted in descending colon and proximal sigmoid colon. There is no small bowel obstruction. No ascites or free air. No adenopathy.  Normal appendix is noted in axial image 48. The terminal ileum is unremarkable.  No calcified calculi are noted within urinary bladder. Prostate gland and seminal vesicles are unremarkable. There is no inguinal adenopathy.  Atherosclerotic  calcifications of abdominal aorta and iliac arteries are noted. No aortic aneurysm.  IMPRESSION: 1. No hydronephrosis or hydroureter. No calcified ureteral calculi. Bilateral renal vascular calcifications are stable from prior exam. 2. Abundant stool noted in right colon and transverse colon. Moderate stool noted in descending colon and proximal sigmoid colon. No small bowel or colonic obstruction. 3. No pericecal inflammation. Normal appendix. 4. Atherosclerotic calcifications of abdominal aorta and iliac arteries.    I personally reviewed the radiologic studies   ED Course: Patient's stay here was uneventful and due to is described history of suicidal thoughts involuntary commitment paperwork was filled out. Patient require psychiatric consultation. Patient more or less came here voluntarily. Patient appears to be dehydrated and has some constipation but otherwise I felt was medically stable at this time and cleared  for further psychiatric evaluation. He will be continued on his normal home medication and given fluids here.    Assessment: * Suicidal ideation* Constipation Mild dehydration with low sodium and mild renal insufficiency  Final Clinical Impression: * Final diagnoses:  Abdominal pain, left lower quadrant     Plan:  Involuntary commitment Psychiatric evaluation           Daymon Larsen, MD 09/12/15 2159

## 2015-09-13 DIAGNOSIS — F259 Schizoaffective disorder, unspecified: Secondary | ICD-10-CM | POA: Diagnosis not present

## 2015-09-13 DIAGNOSIS — F1010 Alcohol abuse, uncomplicated: Secondary | ICD-10-CM | POA: Diagnosis not present

## 2015-09-13 LAB — GLUCOSE, CAPILLARY
GLUCOSE-CAPILLARY: 112 mg/dL — AB (ref 65–99)
GLUCOSE-CAPILLARY: 212 mg/dL — AB (ref 65–99)
Glucose-Capillary: 164 mg/dL — ABNORMAL HIGH (ref 65–99)
Glucose-Capillary: 170 mg/dL — ABNORMAL HIGH (ref 65–99)
Glucose-Capillary: 185 mg/dL — ABNORMAL HIGH (ref 65–99)

## 2015-09-13 MED ORDER — VITAMIN B-1 100 MG PO TABS
100.0000 mg | ORAL_TABLET | Freq: Every day | ORAL | Status: DC
  Administered 2015-09-13: 100 mg via ORAL
  Filled 2015-09-13: qty 1

## 2015-09-13 MED ORDER — GABAPENTIN 300 MG PO CAPS
300.0000 mg | ORAL_CAPSULE | Freq: Three times a day (TID) | ORAL | Status: DC
  Administered 2015-09-13: 300 mg via ORAL
  Filled 2015-09-13 (×2): qty 1

## 2015-09-13 MED ORDER — ACETAMINOPHEN-CODEINE #3 300-30 MG PO TABS
1.0000 | ORAL_TABLET | Freq: Three times a day (TID) | ORAL | Status: DC | PRN
  Administered 2015-09-13 (×2): 1 via ORAL
  Filled 2015-09-13 (×2): qty 1

## 2015-09-13 MED ORDER — ARFORMOTEROL TARTRATE 15 MCG/2ML IN NEBU
15.0000 ug | INHALATION_SOLUTION | Freq: Two times a day (BID) | RESPIRATORY_TRACT | Status: DC | PRN
  Filled 2015-09-13: qty 2

## 2015-09-13 MED ORDER — ALBUTEROL SULFATE (2.5 MG/3ML) 0.083% IN NEBU
3.0000 mL | INHALATION_SOLUTION | Freq: Four times a day (QID) | RESPIRATORY_TRACT | Status: DC | PRN
  Filled 2015-09-13: qty 3

## 2015-09-13 MED ORDER — CLONAZEPAM 0.5 MG PO TABS
0.5000 mg | ORAL_TABLET | Freq: Three times a day (TID) | ORAL | Status: DC
  Administered 2015-09-13: 0.5 mg via ORAL
  Filled 2015-09-13: qty 1

## 2015-09-13 MED ORDER — LUBIPROSTONE 24 MCG PO CAPS
24.0000 ug | ORAL_CAPSULE | Freq: Every day | ORAL | Status: DC
  Administered 2015-09-13: 24 ug via ORAL
  Filled 2015-09-13 (×2): qty 1

## 2015-09-13 MED ORDER — PALIPERIDONE PALMITATE 234 MG/1.5ML IM SUSP: 234.0000 mg | INTRAMUSCULAR | Status: DC

## 2015-09-13 MED ORDER — ESCITALOPRAM OXALATE 10 MG PO TABS
20.0000 mg | ORAL_TABLET | Freq: Every day | ORAL | Status: DC
  Administered 2015-09-13: 20 mg via ORAL
  Filled 2015-09-13: qty 2

## 2015-09-13 MED ORDER — ARFORMOTEROL TARTRATE 15 MCG/2ML IN NEBU
15.0000 ug | INHALATION_SOLUTION | Freq: Two times a day (BID) | RESPIRATORY_TRACT | Status: DC
  Filled 2015-09-13 (×3): qty 2

## 2015-09-13 MED ORDER — INSULIN GLARGINE 100 UNIT/ML ~~LOC~~ SOLN
30.0000 [IU] | Freq: Every day | SUBCUTANEOUS | Status: DC
  Administered 2015-09-13: 30 [IU] via SUBCUTANEOUS
  Filled 2015-09-13 (×2): qty 0.3

## 2015-09-13 MED ORDER — FINASTERIDE 5 MG PO TABS
5.0000 mg | ORAL_TABLET | Freq: Every day | ORAL | Status: DC
  Administered 2015-09-13: 5 mg via ORAL
  Filled 2015-09-13: qty 1

## 2015-09-13 MED ORDER — TORSEMIDE 20 MG PO TABS: 20.0000 mg | ORAL_TABLET | Freq: Two times a day (BID) | ORAL | Status: DC

## 2015-09-13 MED ORDER — TORSEMIDE 20 MG PO TABS
80.0000 mg | ORAL_TABLET | Freq: Every day | ORAL | Status: DC
  Administered 2015-09-13: 80 mg via ORAL
  Filled 2015-09-13: qty 4

## 2015-09-13 MED ORDER — INSULIN ASPART 100 UNIT/ML ~~LOC~~ SOLN
15.0000 [IU] | Freq: Three times a day (TID) | SUBCUTANEOUS | Status: DC
  Administered 2015-09-13 (×2): 15 [IU] via SUBCUTANEOUS
  Filled 2015-09-13: qty 0.15

## 2015-09-13 MED ORDER — INSULIN ASPART 100 UNIT/ML ~~LOC~~ SOLN
SUBCUTANEOUS | Status: AC
  Administered 2015-09-13: 15 [IU] via SUBCUTANEOUS
  Filled 2015-09-13: qty 15

## 2015-09-13 MED ORDER — INSULIN ASPART PROT & ASPART (70-30 MIX) 100 UNIT/ML ~~LOC~~ SUSP
SUBCUTANEOUS | Status: AC
  Filled 2015-09-13: qty 1

## 2015-09-13 NOTE — ED Notes (Signed)

## 2015-09-13 NOTE — ED Notes (Signed)
Dr Duane Lope here

## 2015-09-13 NOTE — ED Notes (Addendum)
Pt states he said he was suicidal because he was bored and he no longer feels that way,he also has no c/o resp difficulty and has refused nebulizer

## 2015-09-13 NOTE — ED Provider Notes (Signed)
-----------------------------------------   3:29 PM on 09/13/2015 -----------------------------------------  Patient has been seen by psychiatry. They believe he is safe for discharge. They have rescinded the IVC. In terms of the patient's sugars after the medication issue they have been stable. Feel patient is safe for discharge.  Nance Pear, MD 09/13/15 [phone]

## 2015-09-13 NOTE — ED Notes (Signed)
2 messages left at group home number to pick up pt

## 2015-09-13 NOTE — ED Notes (Signed)
ED BHU [place] Is the patient under IVC or is there intent for IVC: No. Is the patient medically cleared: Yes.   Is there vacancy in the ED BHU: Yes.   Is the population mix appropriate for patient: Yes.   Is the patient awaiting placement in inpatient or outpatient setting: Yes.   Has the patient had a psychiatric consult: Yes.   Survey of unit performed for contraband, proper placement and condition of furniture, tampering with fixtures in bathroom, shower, and each patient room: Yes.  ; Findings:  APPEARANCE/BEHAVIOR calm and cooperative NEURO ASSESSMENT Orientation: time, place and person Hallucinations: No.None noted (Hallucinations) Speech: Normal Gait: normal RESPIRATORY ASSESSMENT Normal expansion.  Clear to auscultation.  No rales, rhonchi, or wheezing. CARDIOVASCULAR ASSESSMENT regular rate and rhythm, S1, S2 normal, no murmur, click, rub or gallop GASTROINTESTINAL ASSESSMENT soft, nontender, BS WNL, no r/g EXTREMITIES normal strength, tone, and muscle mass PLAN OF CARE Provide calm/safe environment. Vital signs assessed twice daily. ED BHU Assessment once each 12-hour shift. Collaborate with intake RN daily or as condition indicates. Assure the ED provider has rounded once each shift. Provide and encourage hygiene. Provide redirection as needed. Assess for escalating behavior; address immediately and inform ED provider.  Assess family dynamic and appropriateness for visitation as needed: Yes.  ; If necessary, describe findings:  Educate the patient/family about BHU procedures/visitation: Yes.  ; If necessary, describe findings:

## 2015-09-13 NOTE — ED Notes (Signed)
[name] called and is on his way to pick up pt

## 2015-09-13 NOTE — ED Notes (Signed)
Report received from Amy RN  

## 2015-09-13 NOTE — ED Notes (Signed)
Patient resting quietly in bed.  Snack and insulin given.  Will continue to monitor with security cameras and q.15 minute safety checks.

## 2015-09-13 NOTE — ED Notes (Signed)
Asked pt about rechecking his cbg before he left he refused he was only interested in getting pain meds,still waiting on his ride

## 2015-09-13 NOTE — ED Notes (Signed)
Pt alert and states his pain is better he still just wants to go home still denies si

## 2015-09-13 NOTE — ED Notes (Signed)
Group home staff called that pt would be dc this afternoon

## 2015-09-13 NOTE — ED Notes (Signed)
Pt. Noted in room. No complaints or concerns voiced. No distress or abnormal behavior noted. Will continue to monitor with security cameras. Q 15 minute rounds continue.pt asking frequently for pain meds he was informed they are not due until 0900

## 2015-09-13 NOTE — ED Notes (Signed)
Pt. Noted in room. No complaints or concerns voiced. No distress or abnormal behavior noted. Will continue to monitor with security cameras. Q 15 minute rounds continue. 

## 2015-09-13 NOTE — Consult Note (Signed)
[facility] Consult   Reason for Consult:  Follow up Referring Physician:  ER Patient Identification: [name] MRN:  [id number] Principal Diagnosis: <principal problem not specified> Diagnosis:   Patient Active Problem List   Diagnosis Date Noted  . AKI (acute kidney injury) ([place]) [N17.9]   . Overdose [T50.901A] 07/27/2015  . Encounter for central line placement [Z45.2]   . 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 Chest pain [R07.9] 05/26/2015  . Healthcare maintenance [Z00.00] 02/10/2015  . Constipation [K59.00] 02/06/2015  . BPH (benign prostatic hyperplasia) [N40.0] 01/16/2015  . Bilateral leg edema [R60.0] 01/16/2015  . Vitamin D deficiency [E55.9] 01/16/2015  . Allergic rhinitis [J30.9] 11/19/2014  . Chronic low back pain [M54.5, G89.29] 11/07/2014  . Onychomycosis of toenail [B35.1] 11/07/2014  . Tinea pedis [B35.3] 11/07/2014  . Alcohol abuse [F10.10] 10/17/2014  . Psoriasis [L40.9] 10/14/2014  . HTN (hypertension) [I10] 10/14/2014  . Diabetes mellitus type 2, controlled ([place]) [E11.9] 10/14/2014  . COPD (chronic obstructive pulmonary disease) ([place]) [J44.9] 10/14/2014  . Tobacco abuse [Z72.0] 10/14/2014  . HLD (hyperlipidemia) [E78.5] 10/14/2014  . Schizoaffective disorder ([place]) [F25.9] 10/14/2014    Total Time spent with patient: 45 minutes  Subjective:   [name] is a 54 y.o. male patient admitted with a long H/O MI and has been living at a Group home were he had problems.  HPI:  Pt got drunk on beer and had behavioral problems for which he was here for help.  Past Psychiatric History:  Long H/O MI and has been living at [facility].  Risk to Self: Suicidal Ideation: Yes-Currently Present Suicidal Intent: Yes-Currently Present Is patient at risk for suicide?: Yes Suicidal Plan?: Yes-Currently Present Specify Current Suicidal Plan: Tried to hang self two weeks ago Access to Means: Yes Specify Access to Suicidal Means: Have sheets on his  bed What has been your use of drugs/alcohol within the last 12 months?: Alcohol How many times?: 100 ("Lord I can't count, probably a 100" ) Other Self Harm Risks: None Noted Triggers for Past Attempts: Unpredictable Intentional Self Injurious Behavior: None Risk to Others: Homicidal Ideation: No Thoughts of Harm to Others: No Current Homicidal Intent: No Current Homicidal Plan: No Access to Homicidal Means: No Identified Victim: None Reported History of harm to others?: No Assessment of Violence: None Noted Violent Behavior Description: None Reported Does patient have access to weapons?: No Criminal Charges Pending?: No Does patient have a court date: No Prior Inpatient Therapy: Prior Inpatient Therapy: Yes Prior Therapy Dates: Unknown (Unknown) Prior Therapy Facilty/Provider(s): Unknown Reason for Treatment: Unknown Prior Outpatient Therapy: Prior Outpatient Therapy: Yes Prior Therapy Dates: Current Prior Therapy Facilty/Provider(s): [facility] Reason for Treatment: History Schizoaffective Does patient have an ACCT team?: Yes Does patient have Intensive In-House Services?  : No Does patient have [facility] services? : No Does patient have P4CC services?: No  Past Medical History:  Past Medical History  Diagnosis Date  . Diabetes mellitus without complication ([place]) 9417  . Hypertension 2008  . Schizoaffective disorder ([place]) 2006   . COPD (chronic obstructive pulmonary disease) ([place])   . Alcohol dependence ([place]) 1979    stated abusing ETOH at age 88   . Benzodiazepine dependence ([place])   . Cocaine abuse   . MI (myocardial infarction) ([place])   . Cardiac arrest [facility])     Past Surgical History  Procedure Laterality Date  . Cyst excision     Family History:  Family History  Problem Relation  Age of Onset  . Heart disease Mother   . Heart disease Father   . Heart disease Maternal Grandmother   . Diabetes Maternal Grandmother    Family Psychiatric   History: None Social History:  History  Alcohol Use No     History  Drug Use No    Social History   Social History  . Marital Status: Divorced    Spouse Name: N/A  . Number of Children: N/A  . Years of Education: N/A   Social History Main Topics  . Smoking status: Current Every Day Smoker -- 1.50 packs/day    Types: Cigarettes  . Smokeless tobacco: None  . Alcohol Use: No  . Drug Use: No  . Sexual Activity: No   Other Topics Concern  . None   Social History Narrative   Additional Social History:    Pain Medications: See PTA Prescriptions: See PTA Over the Counter: See PTA History of alcohol / drug use?: Yes Longest period of sobriety (when/how long): 4 to 5 years Negative Consequences of Use: Personal relationships Withdrawal Symptoms:  (None Reported) Name of Substance 1: Alcohol 1 - Age of First Use: Unknown 1 - Amount (size/oz): 1-40oz 1 - Frequency: "Once a month." 1 - Duration: "For years." 1 - Last Use / Amount: 09/11/2015                   Allergies:   Allergies  Allergen Reactions  . Seroquel [Quetiapine Fumarate] Anxiety and Other (See Comments)    Reaction:  Nightmares   . Codeine Itching  . Haldol [Haloperidol Lactate] Anxiety and Other (See Comments)    Reaction:  Nightmares   . Trazodone And Nefazodone Anxiety and Other (See Comments)    Reaction:  Nightmares     Labs:  Results for orders placed or performed during the hospital encounter of 09/12/15 (from the past 48 hour(s))  CBC with Differential/Platelet     Status: Abnormal   Collection Time: 09/12/15  5:26 PM  Result Value Ref Range   WBC 6.9 3.8 - 10.6 K/uL   RBC 4.25 (L) 4.40 - 5.90 MIL/uL   Hemoglobin 13.9 13.0 - 18.0 g/dL    Comment: RESULT REPEATED AND VERIFIED   HCT 38.3 (L) 40.0 - 52.0 %   MCV 90.0 80.0 - 100.0 fL   MCH 32.7 26.0 - 34.0 pg   MCHC 36.3 (H) 32.0 - 36.0 g/dL   RDW 13.2 11.5 - 14.5 %   Platelets 147 (L) 150 - 440 K/uL   Neutrophils Relative % 55% %    Neutro Abs 3.8 1.4 - 6.5 K/uL   Lymphocytes Relative 33% %   Lymphs Abs 2.2 1.0 - 3.6 K/uL   Monocytes Relative 8% %   Monocytes Absolute 0.5 0.2 - 1.0 K/uL   Eosinophils Relative 3% %   Eosinophils Absolute 0.2 0 - 0.7 K/uL   Basophils Relative 1% %   Basophils Absolute 0.1 0 - 0.1 K/uL  Comprehensive metabolic panel     Status: Abnormal   Collection Time: 09/12/15  5:26 PM  Result Value Ref Range   Sodium 127 (L) 135 - 145 mmol/L   Potassium 3.7 3.5 - 5.1 mmol/L   Chloride 95 (L) 101 - 111 mmol/L   CO2 22 22 - 32 mmol/L   Glucose, Bld 90 65 - 99 mg/dL   BUN 20 6 - 20 mg/dL   Creatinine, Ser 1.61 (H) 0.61 - 1.24 mg/dL   Calcium 8.9 8.9 - 10.3  mg/dL   Total Protein 6.8 6.5 - 8.1 g/dL   Albumin 4.1 3.5 - 5.0 g/dL   AST 15 15 - 41 U/L   ALT 15 (L) 17 - 63 U/L   Alkaline Phosphatase 58 38 - 126 U/L   Total Bilirubin 0.6 0.3 - 1.2 mg/dL   GFR calc non Af Amer 47 (L) >60 mL/min   GFR calc Af Amer 54 (L) >60 mL/min    Comment: (NOTE) The eGFR has been calculated using the CKD EPI equation. This calculation has not been validated in all clinical situations. eGFR's persistently <60 mL/min signify possible Chronic Kidney Disease.    Anion gap 10 5 - 15  Lipase, blood     Status: None   Collection Time: 09/12/15  5:26 PM  Result Value Ref Range   Lipase 35 11 - 51 U/L  Urinalysis complete, with microscopic ([facility] only)     Status: Abnormal   Collection Time: 09/12/15  5:26 PM  Result Value Ref Range   Color, Urine STRAW (A) YELLOW   APPearance CLEAR (A) CLEAR   Glucose, UA NEGATIVE NEGATIVE mg/dL   Bilirubin Urine NEGATIVE NEGATIVE   Ketones, ur NEGATIVE NEGATIVE mg/dL   Specific Gravity, Urine 1.009 1.005 - 1.030   Hgb urine dipstick NEGATIVE NEGATIVE   pH 5.0 5.0 - 8.0   Protein, ur NEGATIVE NEGATIVE mg/dL   Nitrite NEGATIVE NEGATIVE   Leukocytes, UA NEGATIVE NEGATIVE   RBC / HPF NONE SEEN 0 - 5 RBC/hpf   WBC, UA NONE SEEN 0 - 5 WBC/hpf   Bacteria, UA NONE SEEN NONE  SEEN   Squamous Epithelial / LPF 0-5 (A) NONE SEEN  Urine Drug Screen, Qualitative ([facility] only)     Status: Abnormal   Collection Time: 09/12/15  5:26 PM  Result Value Ref Range   Tricyclic, Ur Screen POSITIVE (A) NONE DETECTED   Amphetamines, Ur Screen NONE DETECTED NONE DETECTED   MDMA (Ecstasy)Ur Screen NONE DETECTED NONE DETECTED   Cocaine Metabolite,Ur [place] NONE DETECTED NONE DETECTED   Opiate, Ur Screen POSITIVE (A) NONE DETECTED   Phencyclidine (PCP) Ur S NONE DETECTED NONE DETECTED   Cannabinoid 50 Ng, Ur [place] NONE DETECTED NONE DETECTED   Barbiturates, Ur Screen NONE DETECTED NONE DETECTED   Benzodiazepine, Ur Scrn NONE DETECTED NONE DETECTED   Methadone Scn, Ur NONE DETECTED NONE DETECTED    Comment: (NOTE) 950  Tricyclics, urine               Cutoff 1000 ng/mL 200  Amphetamines, urine             Cutoff 1000 ng/mL 300  MDMA (Ecstasy), urine           Cutoff 500 ng/mL 400  Cocaine Metabolite, urine       Cutoff 300 ng/mL 500  Opiate, urine                   Cutoff 300 ng/mL 600  Phencyclidine (PCP), urine      Cutoff 25 ng/mL 700  Cannabinoid, urine              Cutoff 50 ng/mL 800  Barbiturates, urine             Cutoff 200 ng/mL 900  Benzodiazepine, urine           Cutoff 200 ng/mL 1000 Methadone, urine                Cutoff 300 ng/mL 1100 1200  The urine drug screen provides only a preliminary, unconfirmed 1300 analytical test result and should not be used for non-medical 1400 purposes. Clinical consideration and professional judgment should 1500 be applied to any positive drug screen result due to possible 1600 interfering substances. A more specific alternate chemical method 1700 must be used in order to obtain a confirmed analytical result.  1800 Gas chromato graphy / mass spectrometry (GC/MS) is the preferred 1900 confirmatory method.   Ethanol     Status: None   Collection Time: 09/12/15  5:26 PM  Result Value Ref Range   Alcohol, Ethyl (B) <5 <5 mg/dL    Comment:         LOWEST DETECTABLE LIMIT FOR SERUM ALCOHOL IS 5 mg/dL FOR MEDICAL PURPOSES ONLY   Acetaminophen level     Status: Abnormal   Collection Time: 09/12/15  5:26 PM  Result Value Ref Range   Acetaminophen (Tylenol), Serum <10 (L) 10 - 30 ug/mL    Comment:        THERAPEUTIC CONCENTRATIONS VARY SIGNIFICANTLY. A RANGE OF 10-30 ug/mL MAY BE AN EFFECTIVE CONCENTRATION FOR MANY PATIENTS. HOWEVER, SOME ARE BEST TREATED AT CONCENTRATIONS OUTSIDE THIS RANGE. ACETAMINOPHEN CONCENTRATIONS >150 ug/mL AT 4 HOURS AFTER INGESTION AND >50 ug/mL AT 12 HOURS AFTER INGESTION ARE OFTEN ASSOCIATED WITH TOXIC REACTIONS.   Salicylate level     Status: None   Collection Time: 09/12/15  5:26 PM  Result Value Ref Range   Salicylate Lvl <6.9 2.8 - 30.0 mg/dL  Glucose, capillary     Status: Abnormal   Collection Time: 09/13/15 12:52 AM  Result Value Ref Range   Glucose-Capillary 170 (H) 65 - 99 mg/dL   Comment 1 Notify RN   Glucose, capillary     Status: Abnormal   Collection Time: 09/13/15  7:56 AM  Result Value Ref Range   Glucose-Capillary 164 (H) 65 - 99 mg/dL   Comment 1 Notify RN   Glucose, capillary     Status: Abnormal   Collection Time: 09/13/15 11:32 AM  Result Value Ref Range   Glucose-Capillary 112 (H) 65 - 99 mg/dL  Glucose, capillary     Status: Abnormal   Collection Time: 09/13/15 12:52 PM  Result Value Ref Range   Glucose-Capillary 185 (H) 65 - 99 mg/dL  Glucose, capillary     Status: Abnormal   Collection Time: 09/13/15  1:31 PM  Result Value Ref Range   Glucose-Capillary 212 (H) 65 - 99 mg/dL    Current Facility-Administered Medications  Medication Dose Route Frequency Provider Last Rate Last Dose  . acetaminophen-codeine (TYLENOL #3) 300-30 MG per tablet 1 tablet  1 tablet Oral Q8H PRN Daymon Larsen, MD   1 tablet at 09/13/15 [phone]  . albuterol (PROVENTIL) (2.5 MG/3ML) 0.083% nebulizer solution 3 mL  3 mL Inhalation Q6H PRN Daymon Larsen, MD      . arformoterol  [facility]) nebulizer solution 15 mcg  15 mcg Nebulization BID PRN Nance Pear, MD      . clonazePAM [facility] [place]) tablet 0.5 mg  0.5 mg Oral TID Daymon Larsen, MD   0.5 mg at 09/13/15 0920  . escitalopram (LEXAPRO) tablet 20 mg  20 mg Oral Daily Daymon Larsen, MD   20 mg at 09/13/15 0920  . finasteride (PROSCAR) tablet 5 mg  5 mg Oral Daily Daymon Larsen, MD   5 mg at 09/13/15 0919  . gabapentin (NEURONTIN) capsule 300 mg  300 mg Oral TID Daymon Larsen, MD  300 mg at 09/13/15 0918  . insulin aspart (novoLOG) injection 15 Units  15 Units Subcutaneous TID AC Daymon Larsen, MD   15 Units at 09/13/15 1308  . insulin glargine (LANTUS) injection 30 Units  30 Units Subcutaneous Q2200 Daymon Larsen, MD   30 Units at 09/13/15 0104  . lubiprostone (AMITIZA) capsule 24 mcg  24 mcg Oral Q breakfast Daymon Larsen, MD   24 mcg at 09/13/15 0919  . paliperidone (INVEGA SUSTENNA) injection 234 mg  234 mg Intramuscular Q28 days Daymon Larsen, MD   234 mg at 09/13/15 0116  . thiamine (VITAMIN B-1) tablet 100 mg  100 mg Oral Daily Daymon Larsen, MD   100 mg at 09/13/15 0919  . torsemide (DEMADEX) tablet 80 mg  80 mg Oral Daily Daymon Larsen, MD   80 mg at 09/13/15 2297   Current Outpatient Prescriptions  Medication Sig Dispense Refill  . escitalopram (LEXAPRO) 20 MG tablet Take 1 tablet (20 mg total) by mouth daily. 90 tablet 1  . finasteride (PROSCAR) 5 MG tablet Take 1 tablet (5 mg total) by mouth daily. 30 tablet 5  . insulin aspart (NOVOLOG) 100 UNIT/ML injection Inject 15 Units into the skin 3 (three) times daily before meals. Pt also uses per sliding scale:    150-200:  17 units  201-300:  19 units  301-400:  23 units  401-500:  27 units  Greater than 500:  30 units and call MD    . insulin glargine (LANTUS) 100 UNIT/ML injection Inject 0.3 mLs (30 Units total) into the skin daily at 10 pm. 10 mL 11  . ketoconazole (NIZORAL) 2 % cream Apply 1 application topically 2 (two) times daily.     [name] loratadine (CLARITIN) 10 MG tablet Take 10 mg by mouth daily.    [name] lubiprostone (AMITIZA) 24 MCG capsule Take 24 mcg by mouth daily.     . paliperidone (INVEGA SUSTENNA) 234 MG/1.5ML SUSP injection Inject 234 mg into the muscle every 28 (twenty-eight) days.    [name] torsemide (DEMADEX) 20 MG tablet Take 80 mg by mouth daily.    [name] acetaminophen-codeine (TYLENOL #3) 300-30 MG tablet Take 1 tablet by mouth every 8 (eight) hours as needed for moderate pain. (Patient not taking: Reported on 09/12/2015) 90 tablet 1  . albuterol (PROVENTIL HFA;VENTOLIN HFA) 108 (90 BASE) MCG/ACT inhaler Inhale 2 puffs into the lungs every 6 (six) hours as needed for wheezing or shortness of breath. (Patient not taking: Reported on 09/12/2015) 1 Inhaler 5  . arformoterol (BROVANA) 15 MCG/2ML NEBU Take 2 mLs (15 mcg total) by nebulization every 12 (twelve) hours. 120 mL 1  . cetirizine (ZYRTEC) 10 MG tablet Take 1 tablet (10 mg total) by mouth daily. (Patient not taking: Reported on 09/12/2015) 30 tablet 11  . clonazePAM (KLONOPIN) 0.5 MG tablet Take 1 tablet (0.5 mg total) by mouth 3 (three) times daily. (Patient not taking: Reported on 09/12/2015) 60 tablet 0  . Fluticasone-Salmeterol (ADVAIR) 100-50 MCG/DOSE AEPB Inhale 1 puff into the lungs 2 (two) times daily. (Patient not taking: Reported on 09/12/2015) 1 each 5  . gabapentin (NEURONTIN) 300 MG capsule Take 1 capsule (300 mg total) by mouth 3 (three) times daily. (Patient not taking: Reported on 09/12/2015) 90 capsule 1  . thiamine 100 MG tablet Take 1 tablet (100 mg total) by mouth daily. 30 tablet 0  . torsemide (DEMADEX) 20 MG tablet Take 1 tablet (20 mg total) by mouth 2 (two)  times daily. 60 tablet 0    Musculoskeletal: Strength & Muscle Tone: within normal limits Gait & Station: normal Patient leans: N/A  Psychiatric Specialty Exam: Review of Systems  All other systems reviewed and are negative.   Blood pressure 115/67, pulse 73, temperature 97.7 F (36.5  C), temperature source Oral, resp. rate 16, height 6' (1.829 m), weight 240 lb (108.863 kg), SpO2 98 %.Body mass index is 32.54 kg/(m^2).  General Appearance: Casual  Eye Contact::  Fair  Speech:  Normal Rate  Volume:  Normal  Mood:  Anxious  Affect:  Congruent  Thought Process:  Circumstantial  Orientation:  Full (Time, Place, and Person)  Thought Content:  NA  Suicidal Thoughts:  No  Homicidal Thoughts:  No  Memory:  Immediate;   Fair Recent;   Fair Remote;   Fair fair  Judgement:  Fair  Insight:  Fair  Psychomotor Activity:  Normal  Concentration:  Fair  Recall:  [employer] [place]  Language: Fair  Akathisia:  No  Handed:  Right  AIMS (if indicated):     Assets:  Communication Skills Desire for Improvement Housing Social Support Transportation  ADL's:  Intact  Cognition: WNL  Sleep:      Treatment Plan Summary: Plan D/C IVC and [facility].  Disposition: No evidence of imminent risk to self or others at present.    Dewain Penning 09/13/2015 2:17 PM

## 2015-09-13 NOTE — ED Notes (Signed)
Patient sleeping in bed.  Will continue to monitor with security cameras and q.15 minute safety checks.

## 2015-09-13 NOTE — ED Notes (Signed)
Pt given breakfast tray and diet sprite

## 2015-09-13 NOTE — ED Notes (Signed)
Patient to ED-BHU from ED ambulatory without difficulty to room #8.  Patient is alert and oriented, calm and cooperative and in no apparent distress.  Patient denies suicidal ideation, homicidal ideation, auditory or visual hallucinations at current time.  Patient denies pain.  Patient made aware of security cameras and q.15 minute safety checks.  Patient encouraged to notify staff of any questions or concerns.

## 2015-09-13 NOTE — ED Notes (Signed)
Informed charge nurse and she stated to call [place] [facility] to go to group home and tell them they had to pick up their client Also, . Called mr ellison what we were doing

## 2015-09-13 NOTE — ED Notes (Signed)
Patient sleeping.  Will continue to monitor with security cameras and q.15 minute safety checks.

## 2015-09-13 NOTE — ED Notes (Signed)
Patient is sleeping.  Will continue to monitor with security cameras and q.15 minute safety checks.

## 2015-09-13 NOTE — Discharge Instructions (Signed)
Please seek medical attention and help for any thoughts about wanting to harm herself, harm others, any concerning change in behavior, severe depression, inappropriate drug use or any other new or concerning symptoms.  Alcohol Use Disorder Alcohol use disorder is a mental disorder. It is not a one-time incident of heavy drinking. Alcohol use disorder is the excessive and uncontrollable use of alcohol over time that leads to problems with functioning in one or more areas of daily living. People with this disorder risk harming themselves and others when they drink to excess. Alcohol use disorder also can cause other mental disorders, such as mood and anxiety disorders, and serious physical problems. People with alcohol use disorder often misuse other drugs.  Alcohol use disorder is common and widespread. Some people with this disorder drink alcohol to cope with or escape from negative life events. Others drink to relieve chronic pain or symptoms of mental illness. People with a family history of alcohol use disorder are at higher risk of losing control and using alcohol to excess.  Drinking too much alcohol can cause injury, accidents, and health problems. One drink can be too much when you are:  Working.  Pregnant or breastfeeding.  Taking medicines. Ask your doctor.  Driving or planning to drive. SYMPTOMS  Signs and symptoms of alcohol use disorder may include the following:   Consumption ofalcohol inlarger amounts or over a longer period of time than intended.  Multiple unsuccessful attempts to cutdown or control alcohol use.   A great deal of time spent obtaining alcohol, using alcohol, or recovering from the effects of alcohol (hangover).  A strong desire or urge to use alcohol (cravings).   Continued use of alcohol despite problems at work, school, or home because of alcohol use.   Continued use of alcohol despite problems in relationships because of alcohol use.  Continued use  of alcohol in situations when it is physically hazardous, such as driving a car.  Continued use of alcohol despite awareness of a physical or psychological problem that is likely related to alcohol use. Physical problems related to alcohol use can involve the brain, heart, liver, stomach, and intestines. Psychological problems related to alcohol use include intoxication, depression, anxiety, psychosis, delirium, and dementia.   The need for increased amounts of alcohol to achieve the same desired effect, or a decreased effect from the consumption of the same amount of alcohol (tolerance).  Withdrawal symptoms upon reducing or stopping alcohol use, or alcohol use to reduce or avoid withdrawal symptoms. Withdrawal symptoms include:  Racing heart.  Hand tremor.  Difficulty sleeping.  Nausea.  Vomiting.  Hallucinations.  Restlessness.  Seizures. DIAGNOSIS Alcohol use disorder is diagnosed through an assessment by your health care provider. Your health care provider may start by asking three or four questions to screen for excessive or problematic alcohol use. To confirm a diagnosis of alcohol use disorder, at least two symptoms must be present within a 85-month period. The severity of alcohol use disorder depends on the number of symptoms:  Mild--two or three.  Moderate--four or five.  Severe--six or more. Your health care provider may perform a physical exam or use results from lab tests to see if you have physical problems resulting from alcohol use. Your health care provider may refer you to a mental health professional for evaluation. TREATMENT  Some people with alcohol use disorder are able to reduce their alcohol use to low-risk levels. Some people with alcohol use disorder need to quit drinking alcohol. When necessary,  mental health professionals with specialized training in substance use treatment can help. Your health care provider can help you decide how severe your alcohol  use disorder is and what type of treatment you need. The following forms of treatment are available:   Detoxification. Detoxification involves the use of prescription medicines to prevent alcohol withdrawal symptoms in the first week after quitting. This is important for people with a history of symptoms of withdrawal and for heavy drinkers who are likely to have withdrawal symptoms. Alcohol withdrawal can be dangerous and, in severe cases, cause death. Detoxification is usually provided in a hospital or in-patient substance use treatment facility.  Counseling or talk therapy. Talk therapy is provided by substance use treatment counselors. It addresses the reasons people use alcohol and ways to keep them from drinking again. The goals of talk therapy are to help people with alcohol use disorder find healthy activities and ways to cope with life stress, to identify and avoid triggers for alcohol use, and to handle cravings, which can cause relapse.  Medicines.Different medicines can help treat alcohol use disorder through the following actions:  Decrease alcohol cravings.  Decrease the positive reward response felt from alcohol use.  Produce an uncomfortable physical reaction when alcohol is used (aversion therapy).  Support groups. Support groups are run by people who have quit drinking. They provide emotional support, advice, and guidance. These forms of treatment are often combined. Some people with alcohol use disorder benefit from intensive combination treatment provided by specialized substance use treatment centers. Both inpatient and outpatient treatment programs are available.   This information is not intended to replace advice given to you by your health care provider. Make sure you discuss any questions you have with your health care provider.   Document Released: 11/11/2004 Document Revised: 10/25/2014 Document Reviewed: 01/11/2013 Elsevier Interactive Patient Education [employer]

## 2015-09-13 NOTE — ED Notes (Signed)
Pt awake alert still asking about meds , meds were given as ordered

## 2015-09-16 ENCOUNTER — Telehealth: Payer: Self-pay | Admitting: Family Medicine

## 2015-09-16 NOTE — Telephone Encounter (Signed)
Patient called and requested a referral to pain management, patient stated that the assisted living facility where he is currently at is not able to refer him. Please f/u

## 2015-09-18 ENCOUNTER — Encounter (HOSPITAL_COMMUNITY): Payer: Self-pay | Admitting: Emergency Medicine

## 2015-09-18 ENCOUNTER — Other Ambulatory Visit: Payer: Self-pay | Admitting: Family Medicine

## 2015-09-18 ENCOUNTER — Emergency Department (HOSPITAL_COMMUNITY)
Admission: EM | Admit: 2015-09-18 | Discharge: 2015-09-18 | Disposition: A | Discharge status: Home / Self Care | Payer: Medicare Other | Attending: Emergency Medicine | Admitting: Emergency Medicine

## 2015-09-18 ENCOUNTER — Emergency Department (HOSPITAL_COMMUNITY): Payer: Medicare Other

## 2015-09-18 DIAGNOSIS — R1030 Lower abdominal pain, unspecified: Secondary | ICD-10-CM | POA: Diagnosis not present

## 2015-09-18 DIAGNOSIS — R110 Nausea: Secondary | ICD-10-CM | POA: Diagnosis not present

## 2015-09-18 DIAGNOSIS — I10 Essential (primary) hypertension: Secondary | ICD-10-CM | POA: Insufficient documentation

## 2015-09-18 DIAGNOSIS — Z79899 Other long term (current) drug therapy: Secondary | ICD-10-CM | POA: Diagnosis not present

## 2015-09-18 DIAGNOSIS — J449 Chronic obstructive pulmonary disease, unspecified: Secondary | ICD-10-CM | POA: Diagnosis not present

## 2015-09-18 DIAGNOSIS — Z794 Long term (current) use of insulin: Secondary | ICD-10-CM | POA: Diagnosis not present

## 2015-09-18 DIAGNOSIS — F17210 Nicotine dependence, cigarettes, uncomplicated: Secondary | ICD-10-CM | POA: Insufficient documentation

## 2015-09-18 DIAGNOSIS — R109 Unspecified abdominal pain: Secondary | ICD-10-CM | POA: Diagnosis present

## 2015-09-18 DIAGNOSIS — Z8659 Personal history of other mental and behavioral disorders: Secondary | ICD-10-CM | POA: Insufficient documentation

## 2015-09-18 DIAGNOSIS — Z7951 Long term (current) use of inhaled steroids: Secondary | ICD-10-CM | POA: Diagnosis not present

## 2015-09-18 DIAGNOSIS — Z7952 Long term (current) use of systemic steroids: Secondary | ICD-10-CM | POA: Insufficient documentation

## 2015-09-18 DIAGNOSIS — Z7982 Long term (current) use of aspirin: Secondary | ICD-10-CM | POA: Insufficient documentation

## 2015-09-18 DIAGNOSIS — K5900 Constipation, unspecified: Secondary | ICD-10-CM | POA: Diagnosis not present

## 2015-09-18 DIAGNOSIS — E119 Type 2 diabetes mellitus without complications: Secondary | ICD-10-CM | POA: Insufficient documentation

## 2015-09-18 DIAGNOSIS — I252 Old myocardial infarction: Secondary | ICD-10-CM | POA: Insufficient documentation

## 2015-09-18 DIAGNOSIS — K5909 Other constipation: Secondary | ICD-10-CM | POA: Diagnosis not present

## 2015-09-18 LAB — URINALYSIS, ROUTINE W REFLEX MICROSCOPIC
BILIRUBIN URINE: NEGATIVE
Glucose, UA: NEGATIVE mg/dL
HGB URINE DIPSTICK: NEGATIVE
Ketones, ur: NEGATIVE mg/dL
Leukocytes, UA: NEGATIVE
Nitrite: NEGATIVE
PROTEIN: NEGATIVE mg/dL
Specific Gravity, Urine: <1.005 — ABNORMAL LOW (ref 1.005–1.030)
pH: 5.5 (ref 5.0–8.0)

## 2015-09-18 MED ORDER — MAGNESIUM CITRATE PO SOLN: 1.0000 | Freq: Once | ORAL | Status: DC

## 2015-09-18 NOTE — ED Notes (Signed)
Pt given discharge instructions - call to be placed to group home for pt to be picked up - Pt stated that he wanted to wait out front in waiting room . Verbalized understanding concerning prescription , Ambulated off unit at this time

## 2015-09-18 NOTE — ED Notes (Addendum)
Per eMS:Pt  From group home ([facility]) reports constipation x2 weeks with lower abd pain and lower back pain.  Pt eating appropriately. Nausea.  144/82, 90hr, 98.2temp, 146 cbg, 95%

## 2015-09-18 NOTE — ED Provider Notes (Signed)
CSN: [id number]     Arrival date & time 09/18/15  1544 History   First MD Initiated Contact with Patient 09/18/15 1556     Chief Complaint  Patient presents with  . Constipation     (Consider location/radiation/quality/duration/timing/severity/associated sxs/prior Treatment) Patient is a 54 y.o. male presenting with constipation. The history is provided by the patient.  Constipation Associated symptoms: abdominal pain and nausea   Associated symptoms: no back pain, no diarrhea, no dysuria, no fever and no vomiting    Patient with known history of constipation. CT scan for renal study about a week ago that showed constipation. Patient taking her laxative milk of magnesia without any relief. Having nausea but no vomiting. Patient's abdomen is distended most of the time. Patient nontoxic no acute distress.  Past Medical History  Diagnosis Date  . Diabetes mellitus without complication ([place]) [id number]  . Hypertension 2008  . Schizoaffective disorder ([place]) 2006   . COPD (chronic obstructive pulmonary disease) ([place])   . Alcohol dependence ([place]) 1979    stated abusing ETOH at age 79   . Benzodiazepine dependence ([place])   . Cocaine abuse   . MI (myocardial infarction) ([place])   . Cardiac arrest [facility])    Past Surgical History  Procedure Laterality Date  . Cyst excision     Family History  Problem Relation Age of Onset  . Heart disease Mother   . Heart disease Father   . Heart disease Maternal Grandmother   . Diabetes Maternal Grandmother    Social History  Substance Use Topics  . Smoking status: Current Every Day Smoker -- 1.50 packs/day    Types: Cigarettes  . Smokeless tobacco: None  . Alcohol Use: No    Review of Systems  Constitutional: Negative for fever.  HENT: Negative for congestion.   Eyes: Negative for visual disturbance.  Respiratory: Negative for shortness of breath.   Cardiovascular: Negative for chest pain.  Gastrointestinal: Positive for nausea, abdominal pain,  constipation and abdominal distention. Negative for vomiting and diarrhea.  Genitourinary: Negative for dysuria.  Musculoskeletal: Negative for back pain.  Skin: Negative for rash.  Neurological: Negative for headaches.  Hematological: Does not bruise/bleed easily.  Psychiatric/Behavioral: Negative for confusion.      Allergies  Seroquel; Codeine; Haldol; and Trazodone and nefazodone  Home Medications   Prior to Admission medications   Medication Sig Start Date End Date Taking? Authorizing Provider  acetaminophen-codeine (TYLENOL #3) 300-30 MG tablet Take 1 tablet by mouth every 8 (eight) hours as needed for moderate pain. 08/21/15  Yes Josalyn Funches, MD  albuterol (PROVENTIL HFA;VENTOLIN HFA) 108 (90 BASE) MCG/ACT inhaler Inhale 2 puffs into the lungs every 6 (six) hours as needed for wheezing or shortness of breath. 07/25/15  Yes Josalyn Funches, MD  amitriptyline (ELAVIL) 25 MG tablet Take 25 mg by mouth at bedtime.   Yes Historical Provider, MD  arformoterol (BROVANA) 15 MCG/2ML NEBU Take 2 mLs (15 mcg total) by nebulization every 12 (twelve) hours. 07/30/15  Yes Reyne Dumas, MD  aspirin EC 81 MG tablet Take 81 mg by mouth daily.   Yes Historical Provider, MD  atorvastatin (LIPITOR) 40 MG tablet Take 40 mg by mouth daily.   Yes Historical Provider, MD  betamethasone dipropionate (DIPROLENE) 0.05 % ointment Apply 1 application topically 2 (two) times daily.   Yes Historical Provider, MD  cetirizine (ZYRTEC) 10 MG tablet Take 1 tablet (10 mg total) by mouth daily. 03/28/15  Yes Boykin Nearing, MD  Chlorpheniramine-DM (CORICIDIN HBP COUGH/COLD  PO) Take 1-2 capsules by mouth 3 (three) times daily as needed (cough/ congestion).   Yes Historical Provider, MD  clonazePAM (KLONOPIN) 0.5 MG tablet Take 1 tablet (0.5 mg total) by mouth 3 (three) times daily. 07/30/15  Yes Reyne Dumas, MD  escitalopram (LEXAPRO) 20 MG tablet Take 1 tablet (20 mg total) by mouth daily. 11/07/14  Yes Josalyn  Funches, MD  finasteride (PROSCAR) 5 MG tablet Take 1 tablet (5 mg total) by mouth daily. 02/06/15  Yes Josalyn Funches, MD  Fluticasone-Salmeterol (ADVAIR) 100-50 MCG/DOSE AEPB Inhale 1 puff into the lungs 2 (two) times daily. 07/25/15  Yes Josalyn Funches, MD  gabapentin (NEURONTIN) 300 MG capsule Take 1 capsule (300 mg total) by mouth 3 (three) times daily. 08/21/15  Yes Josalyn Funches, MD  insulin aspart (NOVOLOG) 100 UNIT/ML injection Inject 15 Units into the skin 3 (three) times daily before meals. Pt also uses per sliding scale:    150-200:  17 units  201-300:  19 units  301-400:  23 units  401-500:  27 units  Greater than 500:  30 units and call MD   Yes Historical Provider, MD  insulin glargine (LANTUS) 100 UNIT/ML injection Inject 0.3 mLs (30 Units total) into the skin daily at 10 pm. 07/30/15  Yes Reyne Dumas, MD  isosorbide mononitrate (IMDUR) 30 MG 24 hr tablet Take 30 mg by mouth daily.   Yes Historical Provider, MD  ketoconazole (NIZORAL) 2 % cream Apply 1 application topically 2 (two) times daily as needed for irritation.    Yes Historical Provider, MD  lactulose (CHRONULAC) 10 GM/15ML solution Take 30 g by mouth daily as needed for mild constipation.   Yes Historical Provider, MD  lisinopril (PRINIVIL,ZESTRIL) 10 MG tablet Take 10 mg by mouth daily.   Yes Historical Provider, MD  loratadine (CLARITIN) 10 MG tablet Take 10 mg by mouth daily.   Yes Historical Provider, MD  lubiprostone (AMITIZA) 24 MCG capsule Take 24 mcg by mouth daily.    Yes Historical Provider, MD  magnesium hydroxide (MILK OF MAGNESIA) 400 MG/5ML suspension Take 30 mLs by mouth daily as needed for mild constipation.   Yes Historical Provider, MD  metoprolol tartrate (LOPRESSOR) 25 MG tablet Take 25 mg by mouth 2 (two) times daily.   Yes Historical Provider, MD  mometasone (NASONEX) 50 MCG/ACT nasal spray Place 2 sprays into the nose daily.   Yes Historical Provider, MD  NIFEdipine (ADALAT CC) 90 MG 24 hr  tablet Take 90 mg by mouth daily.   Yes Historical Provider, MD  nystatin (MYCOSTATIN/NYSTOP) 100000 UNIT/GM POWD Apply 1 g topically 2 (two) times daily.   Yes Historical Provider, MD  omeprazole (PRILOSEC) 20 MG capsule Take 20 mg by mouth daily.   Yes Historical Provider, MD  Oxycodone HCl 10 MG TABS Take 5 mg by mouth 3 (three) times daily as needed. 08/27/15  Yes Historical Provider, MD  paliperidone (INVEGA SUSTENNA) 234 MG/1.5ML SUSP injection Inject 234 mg into the muscle every 28 (twenty-eight) days.   Yes Historical Provider, MD  thiamine 100 MG tablet Take 1 tablet (100 mg total) by mouth daily. 07/30/15  Yes Reyne Dumas, MD  torsemide (DEMADEX) 20 MG tablet Take 1 tablet (20 mg total) by mouth 2 (two) times daily. 07/30/15  Yes Reyne Dumas, MD  Vitamin D, Ergocalciferol, (DRISDOL) 50000 UNITS CAPS capsule Take 50,000 Units by mouth every 7 (seven) days. On Wednesday.   Yes Historical Provider, MD  magnesium citrate SOLN Take 296 mLs (1 Bottle  total) by mouth once. 09/18/15   Fredia Sorrow, MD   BP 103/67 mmHg  Pulse 75  Temp(Src) 97.4 F (36.3 C) (Oral)  Resp 18  Ht 5\' 11"  (1.803 m)  Wt 104.327 kg  BMI 32.09 kg/m2  SpO2 95% Physical Exam  Constitutional: He is oriented to person, place, and time. He appears well-developed and well-nourished. No distress.  HENT:  Head: Normocephalic and atraumatic.  Mouth/Throat: Oropharynx is clear and moist.  Eyes: Conjunctivae and EOM are normal. Pupils are equal, round, and reactive to light.  Neck: Normal range of motion. Neck supple.  Cardiovascular: Normal rate, regular rhythm and normal heart sounds.   No murmur heard. Pulmonary/Chest: Effort normal and breath sounds normal. No respiratory distress.  Abdominal: Soft. Bowel sounds are normal. He exhibits distension. There is no tenderness.  Musculoskeletal: Normal range of motion.  Neurological: He is alert and oriented to person, place, and time. No cranial nerve deficit. He  exhibits normal muscle tone. Coordination normal.  Skin: Skin is warm.  Nursing note and vitals reviewed.   ED Course  Procedures (including critical care time) Labs Review Labs Reviewed  URINALYSIS, ROUTINE W REFLEX MICROSCOPIC (NOT AT [facility]) - Abnormal; Notable for the following:    Specific Gravity, Urine <1.005 (*)    All other components within normal limits   Results for orders placed or performed during the hospital encounter of 09/18/15  Urinalysis, Routine w reflex microscopic (not at [facility])  Result Value Ref Range   Color, Urine YELLOW YELLOW   APPearance CLEAR CLEAR   Specific Gravity, Urine <1.005 (L) 1.005 - 1.030   pH 5.5 5.0 - 8.0   Glucose, UA NEGATIVE NEGATIVE mg/dL   Hgb urine dipstick NEGATIVE NEGATIVE   Bilirubin Urine NEGATIVE NEGATIVE   Ketones, ur NEGATIVE NEGATIVE mg/dL   Protein, ur NEGATIVE NEGATIVE mg/dL   Nitrite NEGATIVE NEGATIVE   Leukocytes, UA NEGATIVE NEGATIVE     Imaging Review Dg Abd Acute W/chest  09/18/2015  CLINICAL DATA:  Constipation, lower abdominal pain EXAM: DG ABDOMEN ACUTE W/ 1V CHEST COMPARISON:  09/12/2015 FINDINGS: Cardiomediastinal silhouette is unremarkable. No acute infiltrate or pleural effusion. No pulmonary edema. There is normal small bowel gas pattern. Abundant stool noted throughout the colon. Moderate gas noted in distal sigmoid colon. Moderate stool noted within rectum. No free abdominal air. IMPRESSION: Normal small bowel gas pattern. Abundant colonic stool. No free abdominal air Electronically Signed   By: Lahoma Crocker M.D.   On: 09/18/2015 17:20   I have personally reviewed and evaluated these images and lab results as part of my medical decision-making.   EKG Interpretation None      MDM   Final diagnoses:  Constipation, unspecified constipation type     Patient with evidence of constipation on the CT renal stone study about a week ago. Abdominal x-rays today still show evidence of constipation. Patient without  any complicating factors. Patient already on Mira lax and milk of magnesia. Will give patient treatment with mag citrate.    Fredia Sorrow, MD 09/18/15 [phone]

## 2015-09-18 NOTE — Discharge Instructions (Signed)
Constipation, Adult Constipation is when a person:  Poops (has a bowel movement) less than 3 times a week.  Has a hard time pooping.  Has poop that is dry, hard, or bigger than normal. HOME CARE   Eat foods with a lot of fiber in them. This includes fruits, vegetables, beans, and whole grains such as brown rice.  Avoid fatty foods and foods with a lot of sugar. This includes french fries, hamburgers, cookies, candy, and soda.  If you are not getting enough fiber from food, take products with added fiber in them (supplements).  Drink enough fluid to keep your pee (urine) clear or pale yellow.  Exercise on a regular basis, or as told by your doctor.  Go to the restroom when you feel like you need to poop. Do not hold it.  Only take medicine as told by your doctor. Do not take medicines that help you poop (laxatives) without talking to your doctor first. GET HELP RIGHT AWAY IF:   You have bright red blood in your poop (stool).  Your constipation lasts more than 4 days or gets worse.  You have belly (abdominal) or butt (rectal) pain.  You have thin poop (as thin as a pencil).  You lose weight, and it cannot be explained. MAKE SURE YOU:   Understand these instructions.  Will watch your condition.  Will get help right away if you are not doing well or get worse.   This information is not intended to replace advice given to you by your health care provider. Make sure you discuss any questions you have with your health care provider.  Take the magnesium citrate as directed. Continue with the Mira lax. Return for any new or worse symptoms.   Document Released: 03/22/2008 Document Revised: 10/25/2014 Document Reviewed: 07/16/2013 Elsevier Interactive Patient Education [employer].

## 2015-09-19 DIAGNOSIS — E119 Type 2 diabetes mellitus without complications: Secondary | ICD-10-CM | POA: Diagnosis not present

## 2015-09-19 DIAGNOSIS — L02221 Furuncle of abdominal wall: Secondary | ICD-10-CM | POA: Diagnosis not present

## 2015-09-19 DIAGNOSIS — I10 Essential (primary) hypertension: Secondary | ICD-10-CM | POA: Diagnosis not present

## 2015-09-19 DIAGNOSIS — T528X1D Toxic effect of other organic solvents, accidental (unintentional), subsequent encounter: Secondary | ICD-10-CM | POA: Diagnosis not present

## 2015-09-19 DIAGNOSIS — F29 Unspecified psychosis not due to a substance or known physiological condition: Secondary | ICD-10-CM | POA: Diagnosis not present

## 2015-09-20 DIAGNOSIS — Z7982 Long term (current) use of aspirin: Secondary | ICD-10-CM | POA: Diagnosis not present

## 2015-09-20 DIAGNOSIS — R1084 Generalized abdominal pain: Secondary | ICD-10-CM | POA: Diagnosis not present

## 2015-09-20 DIAGNOSIS — Z7951 Long term (current) use of inhaled steroids: Secondary | ICD-10-CM | POA: Diagnosis not present

## 2015-09-20 DIAGNOSIS — K5900 Constipation, unspecified: Secondary | ICD-10-CM | POA: Diagnosis not present

## 2015-09-20 DIAGNOSIS — E119 Type 2 diabetes mellitus without complications: Secondary | ICD-10-CM | POA: Diagnosis not present

## 2015-09-20 DIAGNOSIS — I10 Essential (primary) hypertension: Secondary | ICD-10-CM | POA: Diagnosis not present

## 2015-09-20 DIAGNOSIS — R935 Abnormal findings on diagnostic imaging of other abdominal regions, including retroperitoneum: Secondary | ICD-10-CM | POA: Diagnosis not present

## 2015-09-20 DIAGNOSIS — J449 Chronic obstructive pulmonary disease, unspecified: Secondary | ICD-10-CM | POA: Diagnosis not present

## 2015-09-20 DIAGNOSIS — F17200 Nicotine dependence, unspecified, uncomplicated: Secondary | ICD-10-CM | POA: Diagnosis not present

## 2015-09-20 DIAGNOSIS — R109 Unspecified abdominal pain: Secondary | ICD-10-CM | POA: Diagnosis not present

## 2015-09-20 DIAGNOSIS — R0789 Other chest pain: Secondary | ICD-10-CM | POA: Diagnosis not present

## 2015-09-20 DIAGNOSIS — K5909 Other constipation: Secondary | ICD-10-CM | POA: Diagnosis not present

## 2015-09-20 DIAGNOSIS — R100 Acute abdomen: Secondary | ICD-10-CM | POA: Diagnosis not present

## 2015-09-20 DIAGNOSIS — Z794 Long term (current) use of insulin: Secondary | ICD-10-CM | POA: Diagnosis not present

## 2015-09-20 DIAGNOSIS — Z79899 Other long term (current) drug therapy: Secondary | ICD-10-CM | POA: Diagnosis not present

## 2015-09-21 DIAGNOSIS — R450 Nervousness: Secondary | ICD-10-CM | POA: Diagnosis not present

## 2015-09-21 DIAGNOSIS — F411 Generalized anxiety disorder: Secondary | ICD-10-CM | POA: Diagnosis not present

## 2015-09-21 DIAGNOSIS — F29 Unspecified psychosis not due to a substance or known physiological condition: Secondary | ICD-10-CM | POA: Diagnosis not present

## 2015-09-21 DIAGNOSIS — F99 Mental disorder, not otherwise specified: Secondary | ICD-10-CM | POA: Diagnosis not present

## 2015-09-23 ENCOUNTER — Other Ambulatory Visit: Payer: Self-pay | Admitting: Family Medicine

## 2015-09-25 NOTE — Telephone Encounter (Signed)
Referral was placed in October Here is the update regarding the referral  " 1. [facility]  Pt don't want to go there  2. [facility]  Provider denied the patient  3. [facility] Dr Maryjean Ka declined the referral  4. [place] patient  5. [facility] they need pre autorization from [facility]  Is on his medicaid  As pcp ."   I advise patient change PCP on medicaid card to me. So prior authorization to [facility] can be done.

## 2015-09-29 ENCOUNTER — Telehealth: Payer: Self-pay | Admitting: Family Medicine

## 2015-09-29 NOTE — Telephone Encounter (Signed)
Pt. Called requesting a medication that can help him have a bowel movement. Pt. Stated he has not had a bowel movement in over 10 days, and his stomach hurts. Please f/u with pt.

## 2015-09-30 ENCOUNTER — Telehealth: Payer: Self-pay | Admitting: Family Medicine

## 2015-09-30 ENCOUNTER — Other Ambulatory Visit: Payer: Self-pay | Admitting: Family Medicine

## 2015-09-30 DIAGNOSIS — K5901 Slow transit constipation: Secondary | ICD-10-CM

## 2015-09-30 MED ORDER — LACTULOSE 10 GM/15ML PO SOLN: 10.0000 g | Freq: Two times a day (BID) | ORAL | Status: DC

## 2015-09-30 NOTE — Telephone Encounter (Signed)
Unable to contact pt. No answer.

## 2015-09-30 NOTE — Telephone Encounter (Signed)
Pt. Caregiver called requesting for the Novolog order to be changed. Pt. Hours for Novolog are 6 a.m., 11a.m. and 4p.m., and he would like it to be changed 8a.m. 1p.m. And 6p.m. Pt. Also takes Lantus and he refuses to take it. Pt. Went to the ED and was prescribed magnesium citrate and would like to be prescribed the same medication because lactulose (CHRONULAC) 10 GM/15ML solution does not help him. Pt. Would also like to be prescribed a pain pill. Please f/u with pt.

## 2015-09-30 NOTE — Telephone Encounter (Signed)
I have sent a prescription for Lactulose to his pharmacy.

## 2015-10-01 MED ORDER — POLYETHYLENE GLYCOL 3350 17 GM/SCOOP PO POWD: 17.0000 g | Freq: Two times a day (BID) | ORAL | Status: DC | PRN

## 2015-10-01 NOTE — Telephone Encounter (Signed)
novolog is written for TID he can take it whenever is suits him Patient has recent AKI he cannot take mag citrate due to risk of mag level getting too high in blood He can try miralax, order placed

## 2015-10-07 ENCOUNTER — Telehealth: Payer: Self-pay | Admitting: Family Medicine

## 2015-10-07 NOTE — Telephone Encounter (Signed)
Patient called to request a medication to treat his hip pain, patient stated that he has not had a good night sleep for two months.  Please f/u with pt.

## 2015-10-08 DIAGNOSIS — F259 Schizoaffective disorder, unspecified: Secondary | ICD-10-CM | POA: Diagnosis not present

## 2015-10-08 DIAGNOSIS — E119 Type 2 diabetes mellitus without complications: Secondary | ICD-10-CM | POA: Diagnosis not present

## 2015-10-08 DIAGNOSIS — E785 Hyperlipidemia, unspecified: Secondary | ICD-10-CM | POA: Diagnosis not present

## 2015-10-08 DIAGNOSIS — J449 Chronic obstructive pulmonary disease, unspecified: Secondary | ICD-10-CM | POA: Diagnosis not present

## 2015-10-08 DIAGNOSIS — I10 Essential (primary) hypertension: Secondary | ICD-10-CM | POA: Diagnosis not present

## 2015-10-08 NOTE — Telephone Encounter (Signed)
Pt advised has Refills at his pharmacy

## 2015-10-17 ENCOUNTER — Telehealth: Payer: Self-pay | Admitting: Family Medicine

## 2015-10-17 NOTE — Telephone Encounter (Signed)
Patient is prescribed tylenol 3. Patient would like to know if they can be prescribed some pain medicine to take in between.

## 2015-10-21 ENCOUNTER — Telehealth: Payer: Self-pay | Admitting: Family Medicine

## 2015-10-21 ENCOUNTER — Other Ambulatory Visit: Payer: Self-pay | Admitting: Family Medicine

## 2015-10-21 NOTE — Telephone Encounter (Signed)
Pt. States he has been experiencing constipation for the past week...pt. Would like a prescription call into the pharmacy....please follow up with patient

## 2015-10-22 NOTE — Telephone Encounter (Signed)
Pt has F/U appointment with PCP

## 2015-10-31 ENCOUNTER — Ambulatory Visit: Payer: Self-pay | Admitting: Family Medicine

## 2015-11-03 ENCOUNTER — Telehealth: Payer: Self-pay

## 2015-11-03 NOTE — Telephone Encounter (Signed)
Patient called Sunday, November 02, 2015 at 5:20PM, office was closed. Per [facility] note: "Caller states when he woke up this morning his legs gave out, states he is diabetic and his toes are burning and hurting. He says he needs a wheelchair, hips and legs hurting. Says no one will help him". [place] advised patient to go to ED "now". Per two comments documented by [facility] Call Center: Ambulance was notified by Call Center Nurse. EMS had no address available for patient and could not find patient information by telephone numbers, name or date of birth. [place] with [employer] returned call to [facility] nurse to make nurse aware of patient being at [facility] and spoke to [facility], patients caregiver. Patients care giver explains patient has been dringking alcohol and did not need help from EMS, services for EMS were declined.  Nurse called patient, patient verified date of birth. Patient wants electric wheelchair because his hips, side, stomach and legs are in pain. Patient reports pain level at 10 in right side, legs and back. Patient had appointment Friday, October 31, 2015 and did not come to appointment.  Patient reports rest home did not take him.  Nurse spoke with front office staff, no appointments available. Nurse requested patient to call back November 19, 2015 to make appointment, as advised by front office staff.  Nurse will send message to provider.

## 2015-11-04 NOTE — Telephone Encounter (Signed)
Agree with plan for f/u I need to see him to evaluate need for electric wheelchair. Please facilitate f/u appt

## 2015-11-06 ENCOUNTER — Telehealth: Payer: Self-pay | Admitting: Family Medicine

## 2015-11-06 DIAGNOSIS — M545 Low back pain, unspecified: Secondary | ICD-10-CM

## 2015-11-06 DIAGNOSIS — J449 Chronic obstructive pulmonary disease, unspecified: Secondary | ICD-10-CM

## 2015-11-06 DIAGNOSIS — G8929 Other chronic pain: Secondary | ICD-10-CM

## 2015-11-06 NOTE — Telephone Encounter (Signed)
Pt. Needs refill for advair , inhaler, and pain medication needs to be straighten out....he needs it to be written this way 7 am 1pm 7pm...please follow up with patient.Marland KitchenMarland KitchenMarland KitchenMarland Kitchenplease send to [facility]...patient would like to know if it possible for him to get an electric wheelchair....please follow up with patient

## 2015-11-07 MED ORDER — FLUTICASONE-SALMETEROL 100-50 MCG/DOSE IN AEPB: 1.0000 | INHALATION_SPRAY | Freq: Two times a day (BID) | RESPIRATORY_TRACT | Status: DC

## 2015-11-07 MED ORDER — ACETAMINOPHEN-CODEINE #3 300-30 MG PO TABS: 1.0000 | ORAL_TABLET | Freq: Three times a day (TID) | ORAL | Status: DC

## 2015-11-07 NOTE — Telephone Encounter (Signed)
Please call back to patient.   I phoned in patient's tylenol #3 and scheduled it 3 times a day for moderate pain.  90 with 2 refills.  advair refilled.   Patient will need OV to determine if an electric wheelchair is needed

## 2015-11-07 NOTE — Telephone Encounter (Signed)
Nurse called Pearlie. Per Pearlie, patient is walking to store across the street and drinking beer, and buying Goody powders or BC powders.  Per Pearlie, patient gets Tylenol 3 at 8am and 4pm. Patient is asleep when third pill is due at night.  Nurse will send message to provider.

## 2015-11-07 NOTE — Telephone Encounter (Signed)
Pt has  advair refills need to contact [facility] for [facility]ills  Requesting Refill Tylenol #3, requesting increased mg..he needs it to be written this way 7 am 1pm 7pm..Pt pharmacy [facility]...patient would like to know if it possible for him to get an electric wheelchair.Marland KitchenMarland KitchenMarland Kitchen

## 2015-11-07 NOTE — Telephone Encounter (Signed)
Patient called nurse, verified date of birth. Patient aware of Tylenol 3 called in and scheduled 3 times a day for moderate pain. Patient reports facility will not give him his third dose of Tylenol 3 unless the prescription is written to be given at 7am, 1pm and 7pm.  Patient requesting nurse to call [facility], patients caregiver at [phone]. Patient aware of advair being refilled. Patient aware of calling back February 1 to schedule appointment to be seen for electric wheelchair.

## 2015-11-10 NOTE — Telephone Encounter (Signed)
Information noted. Will keep tylenol #3 scheduled for now Patient will get CMP q 3 months to monitor liver function and Cr  Will not refill tylenol #3 unless labs done and patient complies with follow up.  Next OV needed by 12/12/2014

## 2015-11-11 DIAGNOSIS — I10 Essential (primary) hypertension: Secondary | ICD-10-CM | POA: Diagnosis not present

## 2015-11-11 DIAGNOSIS — J449 Chronic obstructive pulmonary disease, unspecified: Secondary | ICD-10-CM | POA: Diagnosis not present

## 2015-11-11 DIAGNOSIS — E119 Type 2 diabetes mellitus without complications: Secondary | ICD-10-CM | POA: Diagnosis not present

## 2015-11-11 DIAGNOSIS — F259 Schizoaffective disorder, unspecified: Secondary | ICD-10-CM | POA: Diagnosis not present

## 2015-11-17 ENCOUNTER — Telehealth: Payer: Self-pay | Admitting: Family Medicine

## 2015-11-17 NOTE — Telephone Encounter (Signed)
Patient is experiencing leg pain, is expressing concern that it might be serious.... Patient states the facility he is staying are not caring for him and will not bring him to his doctor's office....please follow up with patient for advise.Marland KitchenMarland Kitchen

## 2015-11-17 NOTE — Telephone Encounter (Signed)
Patient was instructed by front office to call back February 1 for appointment. [place] office staff sent [facility] message today regarding patient and appointment. CMA will take care of coordinating apportionment for patient.

## 2015-11-20 ENCOUNTER — Telehealth: Payer: Self-pay | Admitting: Family Medicine

## 2015-11-20 DIAGNOSIS — J449 Chronic obstructive pulmonary disease, unspecified: Secondary | ICD-10-CM

## 2015-11-20 NOTE — Telephone Encounter (Signed)
Patient called requesting medication refill for tylenol #3. Please f/u

## 2015-11-20 NOTE — Telephone Encounter (Signed)
Nurse triage needed. Heather, please call patient  OV preferred. If condition sounds serious/acute ED visit recommended.

## 2015-11-21 NOTE — Telephone Encounter (Signed)
Pt. States he needs cough syrup he has congestion, phlegm, headache, runny nose....please follow up with patient

## 2015-11-21 NOTE — Telephone Encounter (Signed)
Nurse called home number, reached voice mail, left message for patient to return call to [facility] at [phone]. Nurse called mobile number reached Pearlie, patients care giver. Pearlie with [facility] explains patient is fine and has no needs at this time.  Pearlie reports patient has had no complaints to her and has been walking back and forth to the store.

## 2015-11-22 DIAGNOSIS — K5900 Constipation, unspecified: Secondary | ICD-10-CM | POA: Diagnosis not present

## 2015-11-22 DIAGNOSIS — R0789 Other chest pain: Secondary | ICD-10-CM | POA: Diagnosis not present

## 2015-11-22 DIAGNOSIS — J069 Acute upper respiratory infection, unspecified: Secondary | ICD-10-CM | POA: Diagnosis not present

## 2015-11-25 MED ORDER — DEXTROMETHORPHAN-GUAIFENESIN 10-200 MG PO CAPS: 1.0000 | ORAL_CAPSULE | Freq: Three times a day (TID) | ORAL | Status: DC | PRN

## 2015-11-25 NOTE — Telephone Encounter (Signed)
Please inform patient  Tylenol #3 was refilled two weeks ago Cough medicine sent in to his pharmacy   F/u appt needed before tylenol #3 will be refilled again

## 2015-12-01 DIAGNOSIS — E119 Type 2 diabetes mellitus without complications: Secondary | ICD-10-CM | POA: Diagnosis not present

## 2015-12-01 DIAGNOSIS — J449 Chronic obstructive pulmonary disease, unspecified: Secondary | ICD-10-CM | POA: Diagnosis not present

## 2015-12-01 DIAGNOSIS — T65894A Toxic effect of other specified substances, undetermined, initial encounter: Secondary | ICD-10-CM | POA: Diagnosis not present

## 2015-12-01 DIAGNOSIS — T6594XA Toxic effect of unspecified substance, undetermined, initial encounter: Secondary | ICD-10-CM | POA: Diagnosis not present

## 2015-12-01 DIAGNOSIS — E872 Acidosis: Secondary | ICD-10-CM | POA: Diagnosis not present

## 2015-12-01 DIAGNOSIS — I10 Essential (primary) hypertension: Secondary | ICD-10-CM | POA: Diagnosis not present

## 2015-12-01 DIAGNOSIS — K2970 Gastritis, unspecified, without bleeding: Secondary | ICD-10-CM | POA: Diagnosis not present

## 2015-12-01 DIAGNOSIS — R10819 Abdominal tenderness, unspecified site: Secondary | ICD-10-CM | POA: Diagnosis not present

## 2015-12-01 DIAGNOSIS — R109 Unspecified abdominal pain: Secondary | ICD-10-CM | POA: Diagnosis not present

## 2015-12-01 DIAGNOSIS — R1013 Epigastric pain: Secondary | ICD-10-CM | POA: Diagnosis not present

## 2015-12-01 DIAGNOSIS — R110 Nausea: Secondary | ICD-10-CM | POA: Diagnosis not present

## 2015-12-02 DIAGNOSIS — T6594XA Toxic effect of unspecified substance, undetermined, initial encounter: Secondary | ICD-10-CM | POA: Diagnosis not present

## 2015-12-02 DIAGNOSIS — N179 Acute kidney failure, unspecified: Secondary | ICD-10-CM | POA: Diagnosis not present

## 2015-12-02 DIAGNOSIS — M25569 Pain in unspecified knee: Secondary | ICD-10-CM | POA: Diagnosis present

## 2015-12-02 DIAGNOSIS — G8929 Other chronic pain: Secondary | ICD-10-CM | POA: Diagnosis not present

## 2015-12-02 DIAGNOSIS — T518X1A Toxic effect of other alcohols, accidental (unintentional), initial encounter: Secondary | ICD-10-CM | POA: Diagnosis present

## 2015-12-02 DIAGNOSIS — Z794 Long term (current) use of insulin: Secondary | ICD-10-CM | POA: Diagnosis not present

## 2015-12-02 DIAGNOSIS — E119 Type 2 diabetes mellitus without complications: Secondary | ICD-10-CM | POA: Diagnosis not present

## 2015-12-02 DIAGNOSIS — Z743 Need for continuous supervision: Secondary | ICD-10-CM | POA: Diagnosis not present

## 2015-12-02 DIAGNOSIS — F209 Schizophrenia, unspecified: Secondary | ICD-10-CM | POA: Diagnosis not present

## 2015-12-02 DIAGNOSIS — I10 Essential (primary) hypertension: Secondary | ICD-10-CM | POA: Diagnosis present

## 2015-12-02 DIAGNOSIS — I498 Other specified cardiac arrhythmias: Secondary | ICD-10-CM | POA: Diagnosis not present

## 2015-12-02 DIAGNOSIS — E876 Hypokalemia: Secondary | ICD-10-CM | POA: Diagnosis present

## 2015-12-02 DIAGNOSIS — F329 Major depressive disorder, single episode, unspecified: Secondary | ICD-10-CM | POA: Diagnosis present

## 2015-12-02 DIAGNOSIS — Z87898 Personal history of other specified conditions: Secondary | ICD-10-CM | POA: Diagnosis not present

## 2015-12-02 DIAGNOSIS — Z79899 Other long term (current) drug therapy: Secondary | ICD-10-CM | POA: Diagnosis not present

## 2015-12-02 DIAGNOSIS — L0291 Cutaneous abscess, unspecified: Secondary | ICD-10-CM | POA: Diagnosis not present

## 2015-12-02 DIAGNOSIS — F17210 Nicotine dependence, cigarettes, uncomplicated: Secondary | ICD-10-CM | POA: Diagnosis present

## 2015-12-02 DIAGNOSIS — Z888 Allergy status to other drugs, medicaments and biological substances status: Secondary | ICD-10-CM | POA: Diagnosis not present

## 2015-12-02 DIAGNOSIS — Z8249 Family history of ischemic heart disease and other diseases of the circulatory system: Secondary | ICD-10-CM | POA: Diagnosis not present

## 2015-12-02 DIAGNOSIS — R279 Unspecified lack of coordination: Secondary | ICD-10-CM | POA: Diagnosis not present

## 2015-12-02 DIAGNOSIS — Z781 Physical restraint status: Secondary | ICD-10-CM | POA: Diagnosis not present

## 2015-12-02 DIAGNOSIS — L409 Psoriasis, unspecified: Secondary | ICD-10-CM | POA: Diagnosis present

## 2015-12-02 DIAGNOSIS — M545 Low back pain: Secondary | ICD-10-CM | POA: Diagnosis present

## 2015-12-02 DIAGNOSIS — Z809 Family history of malignant neoplasm, unspecified: Secondary | ICD-10-CM | POA: Diagnosis not present

## 2015-12-02 DIAGNOSIS — R109 Unspecified abdominal pain: Secondary | ICD-10-CM | POA: Diagnosis not present

## 2015-12-02 DIAGNOSIS — E872 Acidosis: Secondary | ICD-10-CM | POA: Diagnosis present

## 2015-12-02 DIAGNOSIS — T528X1A Toxic effect of other organic solvents, accidental (unintentional), initial encounter: Secondary | ICD-10-CM | POA: Diagnosis not present

## 2015-12-02 DIAGNOSIS — R110 Nausea: Secondary | ICD-10-CM | POA: Diagnosis not present

## 2015-12-02 DIAGNOSIS — J449 Chronic obstructive pulmonary disease, unspecified: Secondary | ICD-10-CM | POA: Diagnosis not present

## 2015-12-02 DIAGNOSIS — Z7982 Long term (current) use of aspirin: Secondary | ICD-10-CM | POA: Diagnosis not present

## 2015-12-02 DIAGNOSIS — E785 Hyperlipidemia, unspecified: Secondary | ICD-10-CM | POA: Diagnosis present

## 2015-12-02 DIAGNOSIS — R1900 Intra-abdominal and pelvic swelling, mass and lump, unspecified site: Secondary | ICD-10-CM | POA: Diagnosis not present

## 2015-12-02 DIAGNOSIS — T518X2D Toxic effect of other alcohols, intentional self-harm, subsequent encounter: Secondary | ICD-10-CM | POA: Diagnosis not present

## 2015-12-02 DIAGNOSIS — K219 Gastro-esophageal reflux disease without esophagitis: Secondary | ICD-10-CM | POA: Diagnosis present

## 2015-12-02 DIAGNOSIS — T6591XA Toxic effect of unspecified substance, accidental (unintentional), initial encounter: Secondary | ICD-10-CM | POA: Diagnosis not present

## 2015-12-02 DIAGNOSIS — D473 Essential (hemorrhagic) thrombocythemia: Secondary | ICD-10-CM | POA: Diagnosis present

## 2015-12-05 ENCOUNTER — Ambulatory Visit: Payer: Self-pay | Admitting: Family Medicine

## 2015-12-09 ENCOUNTER — Emergency Department
Admission: EM | Admit: 2015-12-09 | Discharge: 2015-12-10 | Disposition: A | Discharge status: Other Acute Inpatient Hospital | Payer: Medicare Other | Attending: Emergency Medicine | Admitting: Emergency Medicine

## 2015-12-09 ENCOUNTER — Emergency Department: Payer: Medicare Other

## 2015-12-09 DIAGNOSIS — J441 Chronic obstructive pulmonary disease with (acute) exacerbation: Secondary | ICD-10-CM | POA: Diagnosis not present

## 2015-12-09 DIAGNOSIS — I129 Hypertensive chronic kidney disease with stage 1 through stage 4 chronic kidney disease, or unspecified chronic kidney disease: Secondary | ICD-10-CM | POA: Insufficient documentation

## 2015-12-09 DIAGNOSIS — Z79899 Other long term (current) drug therapy: Secondary | ICD-10-CM | POA: Diagnosis not present

## 2015-12-09 DIAGNOSIS — E871 Hypo-osmolality and hyponatremia: Secondary | ICD-10-CM | POA: Diagnosis not present

## 2015-12-09 DIAGNOSIS — N289 Disorder of kidney and ureter, unspecified: Secondary | ICD-10-CM

## 2015-12-09 DIAGNOSIS — F1310 Sedative, hypnotic or anxiolytic abuse, uncomplicated: Secondary | ICD-10-CM | POA: Diagnosis not present

## 2015-12-09 DIAGNOSIS — Z7951 Long term (current) use of inhaled steroids: Secondary | ICD-10-CM | POA: Diagnosis not present

## 2015-12-09 DIAGNOSIS — Z7982 Long term (current) use of aspirin: Secondary | ICD-10-CM | POA: Insufficient documentation

## 2015-12-09 DIAGNOSIS — F17210 Nicotine dependence, cigarettes, uncomplicated: Secondary | ICD-10-CM | POA: Diagnosis not present

## 2015-12-09 DIAGNOSIS — N189 Chronic kidney disease, unspecified: Secondary | ICD-10-CM | POA: Insufficient documentation

## 2015-12-09 DIAGNOSIS — Z794 Long term (current) use of insulin: Secondary | ICD-10-CM | POA: Insufficient documentation

## 2015-12-09 DIAGNOSIS — E119 Type 2 diabetes mellitus without complications: Secondary | ICD-10-CM | POA: Insufficient documentation

## 2015-12-09 DIAGNOSIS — F22 Delusional disorders: Secondary | ICD-10-CM

## 2015-12-09 DIAGNOSIS — F29 Unspecified psychosis not due to a substance or known physiological condition: Secondary | ICD-10-CM | POA: Diagnosis not present

## 2015-12-09 DIAGNOSIS — Z046 Encounter for general psychiatric examination, requested by authority: Secondary | ICD-10-CM | POA: Diagnosis present

## 2015-12-09 DIAGNOSIS — R062 Wheezing: Secondary | ICD-10-CM | POA: Diagnosis not present

## 2015-12-09 LAB — CBC WITH DIFFERENTIAL/PLATELET
Basophils Absolute: 0.1 10*3/uL (ref 0–0.1)
EOS ABS: 0.2 10*3/uL (ref 0–0.7)
HCT: 39.2 % — ABNORMAL LOW (ref 40.0–52.0)
HEMOGLOBIN: 14.1 g/dL (ref 13.0–18.0)
LYMPHS ABS: 1.4 10*3/uL (ref 1.0–3.6)
Lymphocytes Relative: 17 %
MCH: 34 pg (ref 26.0–34.0)
MCHC: 36 g/dL (ref 32.0–36.0)
MCV: 94.5 fL (ref 80.0–100.0)
Monocytes Absolute: 0.8 10*3/uL (ref 0.2–1.0)
Neutro Abs: 5.9 10*3/uL (ref 1.4–6.5)
PLATELETS: 143 10*3/uL — AB (ref 150–440)
RBC: 4.15 MIL/uL — AB (ref 4.40–5.90)
RDW: 14.2 % (ref 11.5–14.5)
WBC: 8.4 10*3/uL (ref 3.8–10.6)

## 2015-12-09 LAB — URINALYSIS COMPLETE WITH MICROSCOPIC (ARMC ONLY)
BILIRUBIN URINE: NEGATIVE
Bacteria, UA: NONE SEEN
GLUCOSE, UA: NEGATIVE mg/dL
Hgb urine dipstick: NEGATIVE
KETONES UR: NEGATIVE mg/dL
Leukocytes, UA: NEGATIVE
Nitrite: NEGATIVE
PROTEIN: NEGATIVE mg/dL
RBC / HPF: NONE SEEN RBC/hpf (ref 0–5)
SPECIFIC GRAVITY, URINE: 1.002 — AB (ref 1.005–1.030)
SQUAMOUS EPITHELIAL / LPF: NONE SEEN
pH: 6 (ref 5.0–8.0)

## 2015-12-09 LAB — COMPREHENSIVE METABOLIC PANEL
ALT: 18 U/L (ref 17–63)
ANION GAP: 10 (ref 5–15)
AST: 17 U/L (ref 15–41)
Albumin: 3.9 g/dL (ref 3.5–5.0)
Alkaline Phosphatase: 69 U/L (ref 38–126)
BUN: 27 mg/dL — ABNORMAL HIGH (ref 6–20)
CALCIUM: 8.9 mg/dL (ref 8.9–10.3)
CHLORIDE: 97 mmol/L — AB (ref 101–111)
CO2: 25 mmol/L (ref 22–32)
CREATININE: 1.72 mg/dL — AB (ref 0.61–1.24)
GFR calc non Af Amer: 43 mL/min — ABNORMAL LOW (ref >60)
GFR, EST AFRICAN AMERICAN: 50 mL/min — AB (ref >60)
Glucose, Bld: 145 mg/dL — ABNORMAL HIGH (ref 65–99)
POTASSIUM: 4 mmol/L (ref 3.5–5.1)
SODIUM: 132 mmol/L — AB (ref 135–145)
TOTAL PROTEIN: 7.1 g/dL (ref 6.5–8.1)
Total Bilirubin: 1 mg/dL (ref 0.3–1.2)

## 2015-12-09 LAB — URINE DRUG SCREEN, QUALITATIVE (ARMC ONLY)
AMPHETAMINES, UR SCREEN: NOT DETECTED
BARBITURATES, UR SCREEN: NOT DETECTED
BENZODIAZEPINE, UR SCRN: NOT DETECTED
Cannabinoid 50 Ng, Ur ~~LOC~~: NOT DETECTED
Cocaine Metabolite,Ur ~~LOC~~: NOT DETECTED
MDMA (Ecstasy)Ur Screen: NOT DETECTED
Methadone Scn, Ur: NOT DETECTED
OPIATE, UR SCREEN: NOT DETECTED
PHENCYCLIDINE (PCP) UR S: NOT DETECTED
Tricyclic, Ur Screen: POSITIVE — AB

## 2015-12-09 LAB — ETHANOL: Alcohol, Ethyl (B): <5 mg/dL (ref <5)

## 2015-12-09 LAB — ACETAMINOPHEN LEVEL

## 2015-12-09 LAB — SALICYLATE LEVEL

## 2015-12-09 MED ORDER — ALBUTEROL SULFATE HFA 108 (90 BASE) MCG/ACT IN AERS
2.0000 | INHALATION_SPRAY | Freq: Four times a day (QID) | RESPIRATORY_TRACT | Status: DC | PRN
  Filled 2015-12-09: qty 6.7

## 2015-12-09 MED ORDER — PREDNISONE 20 MG PO TABS: 60.0000 mg | ORAL_TABLET | Freq: Once | ORAL | Status: DC

## 2015-12-09 MED ORDER — PREDNISONE 20 MG PO TABS
60.0000 mg | ORAL_TABLET | Freq: Every day | ORAL | Status: DC
  Administered 2015-12-09: 60 mg via ORAL
  Filled 2015-12-09: qty 3

## 2015-12-09 MED ORDER — SODIUM CHLORIDE 0.9 % IV BOLUS (SEPSIS)
1000.0000 mL | Freq: Once | INTRAVENOUS | Status: AC
  Administered 2015-12-09: 1000 mL via INTRAVENOUS

## 2015-12-09 NOTE — ED Notes (Signed)
Patient transported to X-ray 

## 2015-12-09 NOTE — BH Assessment (Signed)
Assessment Note  [name] is an 55 y.o. male. Somebody took out papers on me. States that some one is coming up to his window at night looking at him and scaring him.  States that when he goes out to smoke a cigarette people are all shoot in the yard.  He states that his heart has not been beating properly since he drank anti-freeze 8 days ago. He states that he has drank anti freeze 5-6 times.  He states he drinks it to take away his leg and back pain. He states his head and mind don't think right. He denied symptoms of depression. He states that he has some anxiety about going to live with his "mama". He denied current auditory or visual hallucinations.  He denied suicidal or homicidal ideation or intent.  IVC report states  "Respondent recently released from hospital due to drinking anti-freeze. Today he presents with psychotic behaviors of delusions, paranoia and erratic behaviors. Respondent not taking medication according to staff and has made threats to kill himself and that others are telling him to kill himself.  He is currently residing at [facility] [phone].  Diagnosis: schizoaffective disorder Past Medical History:  Past Medical History  Diagnosis Date  . Diabetes mellitus without complication ([place]) [id number]  . Hypertension 2008  . Schizoaffective disorder ([place]) 2006   . COPD (chronic obstructive pulmonary disease) ([place])   . Alcohol dependence ([place]) 1979    stated abusing ETOH at age 50   . Benzodiazepine dependence ([place])   . Cocaine abuse   . MI (myocardial infarction) ([place])   . Cardiac arrest [facility])     Past Surgical History  Procedure Laterality Date  . Cyst excision      Family History:  Family History  Problem Relation Age of Onset  . Heart disease Mother   . Heart disease Father   . Heart disease Maternal Grandmother   . Diabetes Maternal Grandmother     Social History:  reports that he has been smoking Cigarettes.  He has been smoking about  1.50 packs per day. He does not have any smokeless tobacco history on file. He reports that he does not drink alcohol or use illicit drugs.  Additional Social History:  Alcohol / Drug Use History of alcohol / drug use?: No history of alcohol / drug abuse  CIWA: CIWA-Ar BP: 110/70 mmHg Pulse Rate: 89 COWS:    Allergies:  Allergies  Allergen Reactions  . Seroquel [Quetiapine Fumarate] Anxiety and Other (See Comments)    Reaction:  Nightmares   . Codeine Itching  . Haldol [Haloperidol Lactate] Anxiety and Other (See Comments)    Reaction:  Nightmares   . Trazodone And Nefazodone Anxiety and Other (See Comments)    Reaction:  Nightmares     Home Medications:  (Not in a hospital admission)  OB/GYN Status:  No LMP for male patient.  General Assessment Data Location of Assessment: [facility] ED TTS Assessment: In system Is this a Tele or Face-to-Face Assessment?: Face-to-Face Is this an Initial Assessment or a Re-assessment for this encounter?: Initial Assessment Marital status: Divorced [place] name: n/a Is patient pregnant?: No Pregnancy Status: No Living Arrangements: [facility] [phone]) Can pt return to current living arrangement?: Yes Admission Status: Involuntary Is patient capable of signing voluntary admission?: Yes Referral Source: Self/Family/Friend Insurance type: Medicare  Medical Screening Exam ([place]) Medical Exam completed: Yes  Crisis Care Plan Living Arrangements: Group Home (  [facility] [phone]) Legal Guardian: Other: (Self) [facility] of [facility]rist: [facility] of [facility]ist: [facility]  Education Status Is patient currently in school?: No Current Grade: n/a Highest grade of school patient has completed: 9th Name of school: [employer] person: n/a  Risk to self with the past 6 months Suicidal Ideation: No Has patient been a risk to self within the past 6 months prior to admission? :  No Suicidal Intent: No Has patient had any suicidal intent within the past 6 months prior to admission? : No Is patient at risk for suicide?: No Suicidal Plan?: No Has patient had any suicidal plan within the past 6 months prior to admission? : No Access to Means: No What has been your use of drugs/alcohol within the last 12 months?: denied use, but has a history of alcohol abuse Previous Attempts/Gestures: Yes How many times?: 2 (Unsure) Other Self Harm Risks: drinks antifreeze Triggers for Past Attempts: Unpredictable Intentional Self Injurious Behavior:  (drinks anti freeze) Family Suicide History: No Recent stressful life event(s):  (denied) Persecutory voices/beliefs?:  (believes others are out to get him) Depression: No Depression Symptoms:  (denied) Substance abuse history and/or treatment for substance abuse?: Yes Suicide prevention information given to non-admitted patients: Not applicable  Risk to Others within the past 6 months Homicidal Ideation: No Does patient have any lifetime risk of violence toward others beyond the six months prior to admission? : No Thoughts of Harm to Others: No Current Homicidal Intent: No Current Homicidal Plan: No Access to Homicidal Means: No Identified Victim: none identified History of harm to others?: No Assessment of Violence: None Noted Violent Behavior Description: denied Does patient have access to weapons?: No Criminal Charges Pending?: No Does patient have a court date: No Is patient on probation?: No  Psychosis Hallucinations: Auditory, Visual Delusions:  (Paranoid)  Mental Status Report Appearance/Hygiene: In scrubs, Unremarkable Eye Contact: Fair Motor Activity: Unremarkable Speech: Slow Level of Consciousness: Alert Mood: Pleasant Affect: Appropriate to circumstance Anxiety Level: Moderate Thought Processes: Flight of Ideas Judgement: Partial Orientation: Place, Situation, Person Obsessive Compulsive  Thoughts/Behaviors: None  Cognitive Functioning Concentration: Fair Memory: Recent Intact IQ: Average Insight: Fair Impulse Control: Fair Appetite: Poor Sleep: No Change Vegetative Symptoms: None  ADLScreening [facility] Assessment Services) Patient's cognitive ability adequate to safely complete daily activities?: Yes Patient able to express need for assistance with ADLs?: Yes Independently performs ADLs?: Yes (appropriate for developmental age)  Prior Inpatient Therapy Prior Inpatient Therapy: Yes Prior Therapy Dates: "Years ago" Prior Therapy Facilty/Provider(s): [facility] Reason for Treatment: substance abuse  Prior Outpatient Therapy Prior Outpatient Therapy: Yes Prior Therapy Dates: Current Prior Therapy Facilty/Provider(s): [facility] Reason for Treatment: Schizoaffective disorder Does patient have an ACCT team?: Yes Does patient have Intensive In-House Services?  : No Does patient have [facility] services? : No Does patient have P4CC services?: No  ADL Screening (condition at time of admission) Patient's cognitive ability adequate to safely complete daily activities?: Yes Patient able to express need for assistance with ADLs?: Yes Independently performs ADLs?: Yes (appropriate for developmental age)       Abuse/Neglect Assessment (Assessment to be complete while patient is alone) Physical Abuse: Denies Verbal Abuse: Denies Sexual Abuse: Denies Exploitation of patient/patient's resources: Denies Self-Neglect: Denies Values / Beliefs Cultural Requests During Hospitalization: None Spiritual Requests During Hospitalization: None   Advance Directives (For Healthcare) Does patient have an advance directive?: No    Additional Information 1:1 In Past 12 Months?: No CIRT Risk: No Elopement Risk: No  Does patient have medical clearance?: Yes     Disposition:  Disposition Initial Assessment Completed for this Encounter: Yes Disposition of Patient: Other dispositions  On  Site Evaluation by:   Reviewed with Physician:    Elmer Bales 12/09/2015 9:41 PM

## 2015-12-09 NOTE — ED Notes (Signed)
Pt arrived to ED via PD with IVC papers. IVC papers reports pt has psychotic behavior of delusions, paranoia and erratic behavior. Pt states "I am just all messed up. My heart is acting up". Reported pt has recent admission for drinking anti-freeze. Pt denies SI/HI at this time. Pt reports "people are out to get me." Pt alert and oriented at this time.

## 2015-12-09 NOTE — ED Provider Notes (Signed)
[facility] Emergency Department Provider Note  ____________________________________________  Time seen: Approximately 8:11 PM  I have reviewed the triage vital signs and the nursing notes.   HISTORY  Chief Complaint Psychiatric Evaluation    HPI [name] is a 55 y.o. male with a history of COPD and ongoing tobacco abuse, alcohol dependence, polysubstance abuse, HTN, CAD status post cardiac arrest, DM broad with IVC paperwork by the police department for delusions. The patient reports that he is scared" that someone is trying to kill me." When I asked him who, he responded "anyone, or someone from my group home, people who are out hunting." He denies any current SI, HI or hallucinations. He denies any acute medical complaints.   Past Medical History  Diagnosis Date  . Diabetes mellitus without complication ([place]) [id number]  . Hypertension 2008  . Schizoaffective disorder ([place]) 2006   . COPD (chronic obstructive pulmonary disease) ([place])   . Alcohol dependence ([place]) 1979    stated abusing ETOH at age 84   . Benzodiazepine dependence ([place])   . Cocaine abuse   . MI (myocardial infarction) ([place])   . Cardiac arrest [facility])     Patient Active Problem List   Diagnosis Date Noted  . AKI (acute kidney injury) ([place])   . Overdose 07/27/2015  . Encounter for central line placement   . Ethylene glycol poisoning   . Chest pain 05/26/2015  . Healthcare maintenance 02/10/2015  . Constipation 02/06/2015  . BPH (benign prostatic hyperplasia) 01/16/2015  . Bilateral leg edema 01/16/2015  . Vitamin D deficiency 01/16/2015  . Allergic rhinitis 11/19/2014  . Chronic low back pain 11/07/2014  . Onychomycosis of toenail 11/07/2014  . Tinea pedis 11/07/2014  . Alcohol abuse 10/17/2014  . Psoriasis 10/14/2014  . HTN (hypertension) 10/14/2014  . Diabetes mellitus type 2, controlled ([place]) 10/14/2014  . COPD (chronic obstructive pulmonary disease) ([place]) 10/14/2014  .  Tobacco abuse 10/14/2014  . HLD (hyperlipidemia) 10/14/2014  . Schizoaffective disorder ([place]) 10/14/2014    Past Surgical History  Procedure Laterality Date  . Cyst excision      Current Outpatient Rx  Name  Route  Sig  Dispense  Refill  . acetaminophen-codeine (TYLENOL #3) 300-30 MG tablet   Oral   Take 1 tablet by mouth 3 (three) times daily. For moderate pain at 7 AM, 1 PM and 7 PM   90 tablet   2   . albuterol (PROVENTIL HFA;VENTOLIN HFA) 108 (90 BASE) MCG/ACT inhaler   Inhalation   Inhale 2 puffs into the lungs every 6 (six) hours as needed for wheezing or shortness of breath.   1 Inhaler   5   . amitriptyline (ELAVIL) 25 MG tablet   Oral   Take 25 mg by mouth at bedtime.         [name] aspirin EC 81 MG tablet   Oral   Take 81 mg by mouth daily.         [name] atorvastatin (LIPITOR) 40 MG tablet   Oral   Take 40 mg by mouth daily.         . betamethasone dipropionate (DIPROLENE) 0.05 % ointment   Topical   Apply 1 application topically 2 (two) times daily.         . cetirizine (ZYRTEC) 10 MG tablet   Oral   Take 1 tablet (10 mg total) by mouth daily.   30 tablet   11   . clonazePAM (KLONOPIN) 0.5 MG tablet  Oral   Take 1 tablet (0.5 mg total) by mouth 3 (three) times daily.   60 tablet   0   . Dextromethorphan-Guaifenesin (CORICIDIN HBP CONGESTION/COUGH) 10-200 MG CAPS   Oral   Take 1 capsule by mouth every 8 (eight) hours as needed (cough and congestion).   60 each   3   . escitalopram (LEXAPRO) 20 MG tablet   Oral   Take 1 tablet (20 mg total) by mouth daily.   90 tablet   1   . finasteride (PROSCAR) 5 MG tablet   Oral   Take 1 tablet (5 mg total) by mouth daily.   30 tablet   5   . fluticasone (FLONASE) 50 MCG/ACT nasal spray      PLACE TWO   SPRAYS INTO BOTH NOSTRILS DAILY.   9.9 g   0   . Fluticasone-Salmeterol (ADVAIR) 100-50 MCG/DOSE AEPB   Inhalation   Inhale 1 puff into the lungs 2 (two) times daily.   1 each   5     Dc  brovana   . gabapentin (NEURONTIN) 300 MG capsule      TAKE 1 CAPSULE BY MOUTH 3 TIMES DAILY.   90 capsule   0   . insulin aspart (NOVOLOG) 100 UNIT/ML injection   Subcutaneous   Inject 15 Units into the skin 3 (three) times daily before meals. Pt also uses per sliding scale:    150-200:  17 units  201-300:  19 units  301-400:  23 units  401-500:  27 units  Greater than 500:  30 units and call MD         . insulin glargine (LANTUS) 100 UNIT/ML injection   Subcutaneous   Inject 0.3 mLs (30 Units total) into the skin daily at 10 pm.   10 mL   11   . isosorbide mononitrate (IMDUR) 30 MG 24 hr tablet   Oral   Take 30 mg by mouth daily.         [name] ketoconazole (NIZORAL) 2 % cream   Topical   Apply 1 application topically 2 (two) times daily as needed for irritation.          [name] lisinopril (PRINIVIL,ZESTRIL) 10 MG tablet   Oral   Take 10 mg by mouth daily.         [name] loratadine (CLARITIN) 10 MG tablet   Oral   Take 10 mg by mouth daily.         [name] lubiprostone (AMITIZA) 24 MCG capsule   Oral   Take 24 mcg by mouth daily.          . metoprolol tartrate (LOPRESSOR) 25 MG tablet   Oral   Take 25 mg by mouth 2 (two) times daily.         . mometasone (NASONEX) 50 MCG/ACT nasal spray   Nasal   Place 2 sprays into the nose daily.         [name] NIFEdipine (ADALAT CC) 90 MG 24 hr tablet   Oral   Take 90 mg by mouth daily.         [name] nystatin (MYCOSTATIN/NYSTOP) 100000 UNIT/GM POWD   Topical   Apply 1 g topically 2 (two) times daily.         [name] omeprazole (PRILOSEC) 20 MG capsule   Oral   Take 20 mg by mouth daily.         . Oxycodone HCl 10 MG TABS   Oral  Take 5 mg by mouth 3 (three) times daily as needed.         . paliperidone (INVEGA SUSTENNA) 234 MG/1.5ML SUSP injection   Intramuscular   Inject 234 mg into the muscle every 28 (twenty-eight) days.         . polyethylene glycol powder (GLYCOLAX/MIRALAX) powder   Oral   Take 17 g by mouth  2 (two) times daily as needed.   3350 g   1     D/c mag citrate and lactulose   . thiamine 100 MG tablet   Oral   Take 1 tablet (100 mg total) by mouth daily.   30 tablet   0   . torsemide (DEMADEX) 20 MG tablet   Oral   Take 1 tablet (20 mg total) by mouth 2 (two) times daily.   60 tablet   0   . Vitamin D, Ergocalciferol, (DRISDOL) 50000 UNITS CAPS capsule   Oral   Take 50,000 Units by mouth every 7 (seven) days. On Wednesday.           Allergies Seroquel; Codeine; Haldol; and Trazodone and nefazodone  Family History  Problem Relation Age of Onset  . Heart disease Mother   . Heart disease Father   . Heart disease Maternal Grandmother   . Diabetes Maternal Grandmother     Social History Social History  Substance Use Topics  . Smoking status: Current Every Day Smoker -- 1.50 packs/day    Types: Cigarettes  . Smokeless tobacco: None  . Alcohol Use: No    Review of Systems Constitutional: No fever/chills Eyes: No visual changes. ENT: No sore throat. Cardiovascular: Denies chest pain, palpitations. Respiratory: Denies shortness of breath.  No cough. Gastrointestinal: No abdominal pain.  No nausea, no vomiting.  No diarrhea.  No constipation. Genitourinary: Negative for dysuria. Musculoskeletal: Negative for back pain. Skin: Negative for rash. Neurological: Negative for headaches, focal weakness or numbness. Psychiatric:As of delusions. Negative SI, HI or hallucinations.  10-point ROS otherwise negative.  ____________________________________________   PHYSICAL EXAM:  VITAL SIGNS: ED Triage Vitals  Enc Vitals Group     BP 12/09/15 1953 110/70 mmHg     Pulse Rate 12/09/15 1953 89     Resp 12/09/15 1953 17     Temp 12/09/15 1953 98.3 F (36.8 C)     Temp Source 12/09/15 1953 Oral     SpO2 12/09/15 1953 95 %     Weight 12/09/15 1953 225 lb (102.059 kg)     Height 12/09/15 1953 5\' 11"  (1.803 m)     Head Cir --      Peak Flow --      Pain Score  12/09/15 1953 9     Pain Loc --      Pain Edu? --      Excl. in [place]? --     Constitutional: Patient is alert and oriented and answering questions in a calm manner. He is chronically ill-appearing and nontoxic.  Eyes: Conjunctivae are normal.  EOMI. Head: Atraumatic. Nose: No congestion/rhinnorhea. Mouth/Throat: Mucous membranes are moist.  Neck: No stridor.  Supple.   Cardiovascular: Normal rate, regular rhythm. No murmurs, rubs or gallops.  Respiratory: Normal respiratory effort.  No retractions. Lungs CTAB.  End expiratory wheezing bilaterally without rales or rhonchi. Good air exchange. Gastrointestinal: Obese. Soft and nontender. No distention. No peritoneal signs. Musculoskeletal: No LE edema.  Neurologic:  Normal speech and language. No gross focal neurologic deficits are appreciated.  Skin:  Skin is  warm, dry and intact. No rash noted. Psychiatric: Patient does demonstrate some paranoia and delusions. He does not have insight into these. He denies SI, HI or hallucinations at this time.  ____________________________________________   LABS (all labs ordered are listed, but only abnormal results are displayed)  Labs Reviewed  COMPREHENSIVE METABOLIC PANEL - Abnormal; Notable for the following:    Sodium 132 (*)    Chloride 97 (*)    Glucose, Bld 145 (*)    BUN 27 (*)    Creatinine, Ser 1.72 (*)    GFR calc non Af Amer 43 (*)    GFR calc Af Amer 50 (*)    All other components within normal limits  CBC WITH DIFFERENTIAL/PLATELET - Abnormal; Notable for the following:    RBC 4.15 (*)    HCT 39.2 (*)    Platelets 143 (*)    All other components within normal limits  ACETAMINOPHEN LEVEL - Abnormal; Notable for the following:    Acetaminophen (Tylenol), Serum <10 (*)    All other components within normal limits  URINE DRUG SCREEN, QUALITATIVE ([facility] ONLY) - Abnormal; Notable for the following:    Tricyclic, Ur Screen POSITIVE (*)    All other components within normal limits   URINALYSIS COMPLETEWITH MICROSCOPIC ([facility] ONLY) - Abnormal; Notable for the following:    Color, Urine STRAW (*)    APPearance CLEAR (*)    Specific Gravity, Urine 1.002 (*)    All other components within normal limits  ETHANOL  SALICYLATE LEVEL  BASIC METABOLIC PANEL   ____________________________________________  EKG  ED ECG REPORT I, Eula Listen, the attending physician, personally viewed and interpreted this ECG.   Date: 12/09/2015  EKG Time: 2008  Rate: 89  Rhythm: normal sinus rhythm  Axis: Leftward  Intervals:none  ST&T Change: No ST elevation. No ischemic changes.  ____________________________________________  RADIOLOGY  Dg Chest 2 View  12/09/2015  CLINICAL DATA:  Initial valuation for acute wheezing. EXAM: CHEST  2 VIEW COMPARISON:  Prior study from 09/18/2015. FINDINGS: Allowing for differences in technique, cardiac and mediastinal silhouettes are stable in size and contour, and remain within normal limits. Lungs are mildly hypoinflated. Diffuse peribronchial thickening is present, may be related to underlying COPD and/or acute bronchiolitis. No consolidative airspace disease. No pulmonary edema or pleural effusion. No pneumothorax. No acute osseus abnormality. IMPRESSION: Diffuse peribronchial thickening, which may related to acute bronchiolitis and/ or underlying chronic COPD. No consolidative airspace opacity to suggest pneumonia. Electronically Signed   By: Jeannine Boga M.D.   On: 12/09/2015 20:39    ____________________________________________   PROCEDURES  Procedure(s) performed: None  Critical Care performed: No ____________________________________________   INITIAL IMPRESSION / ASSESSMENT AND PLAN / ED COURSE  Pertinent labs & imaging results that were available during my care of the patient were reviewed by me and considered in my medical decision making (see chart for details).  55 y.o. with a history of polysubstance abuse,  schizoaffective disorder, COPD presenting with paranoid delusions. The patient does not have any SI or HI, but he does not have good insight into his condition today. I will maintain the IVC from the police department until he is able to be evaluated by TTS and psychiatry. In the meantime, he has some mild wheezing with normal oxygen saturations which I believe are most likely his baseline COPD. I will treat him with an albuterol MDI and get a chest x-ray. His EKG does not show any excuse make changes. Anticipate medical clearance with  psychiatric disposition.  ----------------------------------------- 9:47 PM on 12/09/2015 -----------------------------------------  The patient has acute on chronic renal insufficiency with hyponatremia. This is likely due to hypovolemia, so will place an IV and give the patient IV fluids to recheck that these are improving. His chest x-ray does show some peribronchial thickening that is consistent with COPD, and I have ordered a DuoNeb as well as prednisone for him.  ----------------------------------------- 11:27 PM on 12/09/2015 -----------------------------------------  The patient is receiving his IV fluid and no repeat basic metabolic panel is pending. I have signed the patient out to Dr. Beather Arbour, will follow up the patient's lab results and reevaluate his pulmonary status. His psychiatric evaluation is pending.  ____________________________________________  FINAL CLINICAL IMPRESSION(S) / ED DIAGNOSES  Final diagnoses:  Paranoia (psychosis) ([place])  Renal insufficiency  Hyponatremia  COPD exacerbation ([place])      NEW MEDICATIONS STARTED DURING THIS VISIT:  Current Discharge Medication List       Eula Listen, MD 12/09/15 2328

## 2015-12-10 ENCOUNTER — Inpatient Hospital Stay
Admission: EM | Admit: 2015-12-10 | Discharge: 2015-12-14 | DRG: 885 | Disposition: A | Discharge status: Home / Self Care | Payer: Medicare Other | Source: Intra-hospital | Attending: Psychiatry | Admitting: Psychiatry

## 2015-12-10 DIAGNOSIS — Z79899 Other long term (current) drug therapy: Secondary | ICD-10-CM | POA: Diagnosis not present

## 2015-12-10 DIAGNOSIS — F259 Schizoaffective disorder, unspecified: Secondary | ICD-10-CM

## 2015-12-10 DIAGNOSIS — E1122 Type 2 diabetes mellitus with diabetic chronic kidney disease: Secondary | ICD-10-CM | POA: Diagnosis present

## 2015-12-10 DIAGNOSIS — M545 Low back pain: Secondary | ICD-10-CM | POA: Diagnosis present

## 2015-12-10 DIAGNOSIS — K5900 Constipation, unspecified: Secondary | ICD-10-CM | POA: Diagnosis present

## 2015-12-10 DIAGNOSIS — F17210 Nicotine dependence, cigarettes, uncomplicated: Secondary | ICD-10-CM | POA: Diagnosis present

## 2015-12-10 DIAGNOSIS — I252 Old myocardial infarction: Secondary | ICD-10-CM | POA: Diagnosis not present

## 2015-12-10 DIAGNOSIS — Z833 Family history of diabetes mellitus: Secondary | ICD-10-CM | POA: Diagnosis not present

## 2015-12-10 DIAGNOSIS — Z886 Allergy status to analgesic agent status: Secondary | ICD-10-CM | POA: Diagnosis not present

## 2015-12-10 DIAGNOSIS — E221 Hyperprolactinemia: Secondary | ICD-10-CM | POA: Diagnosis present

## 2015-12-10 DIAGNOSIS — F1110 Opioid abuse, uncomplicated: Secondary | ICD-10-CM

## 2015-12-10 DIAGNOSIS — I129 Hypertensive chronic kidney disease with stage 1 through stage 4 chronic kidney disease, or unspecified chronic kidney disease: Secondary | ICD-10-CM | POA: Diagnosis present

## 2015-12-10 DIAGNOSIS — Z8249 Family history of ischemic heart disease and other diseases of the circulatory system: Secondary | ICD-10-CM

## 2015-12-10 DIAGNOSIS — F251 Schizoaffective disorder, depressive type: Secondary | ICD-10-CM | POA: Diagnosis present

## 2015-12-10 DIAGNOSIS — Z7951 Long term (current) use of inhaled steroids: Secondary | ICD-10-CM

## 2015-12-10 DIAGNOSIS — E781 Pure hyperglyceridemia: Secondary | ICD-10-CM | POA: Diagnosis present

## 2015-12-10 DIAGNOSIS — Z794 Long term (current) use of insulin: Secondary | ICD-10-CM | POA: Diagnosis not present

## 2015-12-10 DIAGNOSIS — IMO0002 Reserved for concepts with insufficient information to code with codable children: Secondary | ICD-10-CM

## 2015-12-10 DIAGNOSIS — N400 Enlarged prostate without lower urinary tract symptoms: Secondary | ICD-10-CM | POA: Diagnosis present

## 2015-12-10 DIAGNOSIS — E119 Type 2 diabetes mellitus without complications: Secondary | ICD-10-CM

## 2015-12-10 DIAGNOSIS — G4700 Insomnia, unspecified: Secondary | ICD-10-CM | POA: Diagnosis present

## 2015-12-10 DIAGNOSIS — K219 Gastro-esophageal reflux disease without esophagitis: Secondary | ICD-10-CM | POA: Diagnosis present

## 2015-12-10 DIAGNOSIS — G8929 Other chronic pain: Secondary | ICD-10-CM | POA: Diagnosis present

## 2015-12-10 DIAGNOSIS — E559 Vitamin D deficiency, unspecified: Secondary | ICD-10-CM | POA: Diagnosis present

## 2015-12-10 DIAGNOSIS — Z888 Allergy status to other drugs, medicaments and biological substances status: Secondary | ICD-10-CM

## 2015-12-10 DIAGNOSIS — J449 Chronic obstructive pulmonary disease, unspecified: Secondary | ICD-10-CM | POA: Diagnosis present

## 2015-12-10 DIAGNOSIS — F1310 Sedative, hypnotic or anxiolytic abuse, uncomplicated: Secondary | ICD-10-CM

## 2015-12-10 DIAGNOSIS — N189 Chronic kidney disease, unspecified: Secondary | ICD-10-CM | POA: Diagnosis present

## 2015-12-10 DIAGNOSIS — R609 Edema, unspecified: Secondary | ICD-10-CM | POA: Diagnosis present

## 2015-12-10 DIAGNOSIS — Z8674 Personal history of sudden cardiac arrest: Secondary | ICD-10-CM

## 2015-12-10 LAB — BASIC METABOLIC PANEL
Anion gap: 6 (ref 5–15)
BUN: 25 mg/dL — AB (ref 6–20)
CALCIUM: 8 mg/dL — AB (ref 8.9–10.3)
CO2: 25 mmol/L (ref 22–32)
CREATININE: 1.52 mg/dL — AB (ref 0.61–1.24)
Chloride: 102 mmol/L (ref 101–111)
GFR calc non Af Amer: 50 mL/min — ABNORMAL LOW (ref >60)
GFR, EST AFRICAN AMERICAN: 58 mL/min — AB (ref >60)
Glucose, Bld: 153 mg/dL — ABNORMAL HIGH (ref 65–99)
Potassium: 3.5 mmol/L (ref 3.5–5.1)
SODIUM: 133 mmol/L — AB (ref 135–145)

## 2015-12-10 LAB — GLUCOSE, CAPILLARY
GLUCOSE-CAPILLARY: 370 mg/dL — AB (ref 65–99)
Glucose-Capillary: 242 mg/dL — ABNORMAL HIGH (ref 65–99)
Glucose-Capillary: 249 mg/dL — ABNORMAL HIGH (ref 65–99)

## 2015-12-10 MED ORDER — LIDOCAINE 5 % EX PTCH
1.0000 | MEDICATED_PATCH | CUTANEOUS | Status: DC
  Administered 2015-12-10 – 2015-12-14 (×5): 1 via TRANSDERMAL
  Filled 2015-12-10 (×5): qty 1

## 2015-12-10 MED ORDER — PANTOPRAZOLE SODIUM 40 MG PO TBEC
40.0000 mg | DELAYED_RELEASE_TABLET | Freq: Every day | ORAL | Status: DC
  Administered 2015-12-10 – 2015-12-14 (×5): 40 mg via ORAL
  Filled 2015-12-10 (×6): qty 1

## 2015-12-10 MED ORDER — NIFEDIPINE ER 30 MG PO TB24
90.0000 mg | ORAL_TABLET | Freq: Every day | ORAL | Status: DC
  Administered 2015-12-10 – 2015-12-14 (×5): 90 mg via ORAL
  Filled 2015-12-10 (×9): qty 3

## 2015-12-10 MED ORDER — HYDROXYZINE HCL 50 MG PO TABS
50.0000 mg | ORAL_TABLET | Freq: Every day | ORAL | Status: DC
Start: 2015-12-10 — End: 2015-12-10

## 2015-12-10 MED ORDER — VITAMIN D (ERGOCALCIFEROL) 1.25 MG (50000 UNIT) PO CAPS
50000.0000 [IU] | ORAL_CAPSULE | ORAL | Status: DC
  Administered 2015-12-10: 50000 [IU] via ORAL
  Filled 2015-12-10: qty 1

## 2015-12-10 MED ORDER — HYDROXYZINE HCL 50 MG PO TABS
50.0000 mg | ORAL_TABLET | Freq: Four times a day (QID) | ORAL | Status: DC | PRN
  Administered 2015-12-10 – 2015-12-13 (×4): 50 mg via ORAL
  Filled 2015-12-10 (×4): qty 1

## 2015-12-10 MED ORDER — INSULIN ASPART 100 UNIT/ML ~~LOC~~ SOLN
0.0000 [IU] | Freq: Every day | SUBCUTANEOUS | Status: DC
  Administered 2015-12-10: 2 [IU] via SUBCUTANEOUS
  Filled 2015-12-10: qty 1
  Filled 2015-12-10: qty 2

## 2015-12-10 MED ORDER — FLUTICASONE PROPIONATE 50 MCG/ACT NA SUSP
2.0000 | Freq: Every day | NASAL | Status: DC
  Administered 2015-12-10 – 2015-12-12 (×3): 2 via NASAL
  Filled 2015-12-10: qty 16

## 2015-12-10 MED ORDER — THIOTHIXENE 10 MG PO CAPS
10.0000 mg | ORAL_CAPSULE | Freq: Every day | ORAL | Status: DC
  Administered 2015-12-10 – 2015-12-13 (×4): 10 mg via ORAL
  Filled 2015-12-10 (×5): qty 1

## 2015-12-10 MED ORDER — GABAPENTIN 300 MG PO CAPS
300.0000 mg | ORAL_CAPSULE | Freq: Three times a day (TID) | ORAL | Status: DC
  Administered 2015-12-10: 300 mg via ORAL
  Filled 2015-12-10: qty 1

## 2015-12-10 MED ORDER — ASPIRIN EC 81 MG PO TBEC
81.0000 mg | DELAYED_RELEASE_TABLET | Freq: Every day | ORAL | Status: DC
Start: 2015-12-10 — End: 2015-12-14
  Administered 2015-12-10 – 2015-12-14 (×5): 81 mg via ORAL
  Filled 2015-12-10 (×5): qty 1

## 2015-12-10 MED ORDER — TORSEMIDE 20 MG PO TABS
20.0000 mg | ORAL_TABLET | Freq: Two times a day (BID) | ORAL | Status: DC
  Administered 2015-12-10 – 2015-12-14 (×10): 20 mg via ORAL
  Filled 2015-12-10 (×10): qty 1

## 2015-12-10 MED ORDER — NICOTINE 21 MG/24HR TD PT24
21.0000 mg | MEDICATED_PATCH | Freq: Every day | TRANSDERMAL | Status: DC
  Administered 2015-12-10 – 2015-12-14 (×5): 21 mg via TRANSDERMAL
  Filled 2015-12-10 (×5): qty 1

## 2015-12-10 MED ORDER — ISOSORBIDE MONONITRATE ER 30 MG PO TB24
30.0000 mg | ORAL_TABLET | Freq: Every day | ORAL | Status: DC
  Administered 2015-12-10 – 2015-12-14 (×5): 30 mg via ORAL
  Filled 2015-12-10 (×5): qty 1

## 2015-12-10 MED ORDER — MAGNESIUM HYDROXIDE 400 MG/5ML PO SUSP: 30.0000 mL | Freq: Every day | ORAL | Status: DC | PRN

## 2015-12-10 MED ORDER — NICOTINE 21 MG/24HR TD PT24: 21.0000 mg | MEDICATED_PATCH | Freq: Every day | TRANSDERMAL | Status: DC | PRN

## 2015-12-10 MED ORDER — TRIAMCINOLONE ACETONIDE 0.5 % EX CREA
TOPICAL_CREAM | Freq: Two times a day (BID) | CUTANEOUS | Status: DC
  Filled 2015-12-10: qty 15

## 2015-12-10 MED ORDER — FINASTERIDE 5 MG PO TABS
5.0000 mg | ORAL_TABLET | Freq: Every day | ORAL | Status: DC
  Administered 2015-12-10 – 2015-12-14 (×5): 5 mg via ORAL
  Filled 2015-12-10 (×5): qty 1

## 2015-12-10 MED ORDER — HYDROXYZINE HCL 50 MG PO TABS: 50.0000 mg | ORAL_TABLET | Freq: Every evening | ORAL | Status: DC | PRN

## 2015-12-10 MED ORDER — LORATADINE 10 MG PO TABS
10.0000 mg | ORAL_TABLET | Freq: Every day | ORAL | Status: DC
  Filled 2015-12-10: qty 1

## 2015-12-10 MED ORDER — GABAPENTIN 400 MG PO CAPS
400.0000 mg | ORAL_CAPSULE | Freq: Three times a day (TID) | ORAL | Status: DC
  Administered 2015-12-10 – 2015-12-14 (×12): 400 mg via ORAL
  Filled 2015-12-10 (×12): qty 1

## 2015-12-10 MED ORDER — AMITRIPTYLINE HCL 25 MG PO TABS
25.0000 mg | ORAL_TABLET | Freq: Every day | ORAL | Status: DC
  Administered 2015-12-10 – 2015-12-12 (×3): 25 mg via ORAL
  Filled 2015-12-10 (×4): qty 1

## 2015-12-10 MED ORDER — LISINOPRIL 10 MG PO TABS
10.0000 mg | ORAL_TABLET | Freq: Every day | ORAL | Status: DC
  Administered 2015-12-10 – 2015-12-14 (×5): 10 mg via ORAL
  Filled 2015-12-10 (×5): qty 1

## 2015-12-10 MED ORDER — LUBIPROSTONE 24 MCG PO CAPS
24.0000 ug | ORAL_CAPSULE | Freq: Every day | ORAL | Status: DC
  Administered 2015-12-10 – 2015-12-14 (×5): 24 ug via ORAL
  Filled 2015-12-10 (×5): qty 1

## 2015-12-10 MED ORDER — ALBUTEROL SULFATE HFA 108 (90 BASE) MCG/ACT IN AERS
2.0000 | INHALATION_SPRAY | Freq: Four times a day (QID) | RESPIRATORY_TRACT | Status: DC | PRN
  Filled 2015-12-10: qty 6.7

## 2015-12-10 MED ORDER — ATORVASTATIN CALCIUM 20 MG PO TABS
40.0000 mg | ORAL_TABLET | Freq: Every day | ORAL | Status: DC
  Administered 2015-12-10 – 2015-12-14 (×5): 40 mg via ORAL
  Filled 2015-12-10 (×5): qty 2

## 2015-12-10 MED ORDER — INSULIN GLARGINE 100 UNIT/ML ~~LOC~~ SOLN
30.0000 [IU] | Freq: Every day | SUBCUTANEOUS | Status: DC
  Administered 2015-12-10 – 2015-12-13 (×4): 30 [IU] via SUBCUTANEOUS
  Filled 2015-12-10 (×5): qty 0.3

## 2015-12-10 MED ORDER — METOPROLOL TARTRATE 25 MG PO TABS
25.0000 mg | ORAL_TABLET | Freq: Two times a day (BID) | ORAL | Status: DC
  Administered 2015-12-10 – 2015-12-14 (×8): 25 mg via ORAL
  Filled 2015-12-10 (×9): qty 1

## 2015-12-10 MED ORDER — ACETAMINOPHEN 325 MG PO TABS
650.0000 mg | ORAL_TABLET | Freq: Four times a day (QID) | ORAL | Status: DC | PRN
  Administered 2015-12-11: 650 mg via ORAL
  Filled 2015-12-10: qty 2

## 2015-12-10 MED ORDER — INSULIN ASPART 100 UNIT/ML ~~LOC~~ SOLN
0.0000 [IU] | Freq: Three times a day (TID) | SUBCUTANEOUS | Status: DC
  Administered 2015-12-10: 9 [IU] via SUBCUTANEOUS
  Administered 2015-12-10: 3 [IU] via SUBCUTANEOUS
  Administered 2015-12-11: 5 [IU] via SUBCUTANEOUS
  Administered 2015-12-11: 2 [IU] via SUBCUTANEOUS
  Filled 2015-12-10: qty 2
  Filled 2015-12-10: qty 9
  Filled 2015-12-10: qty 3
  Filled 2015-12-10: qty 5

## 2015-12-10 MED ORDER — CYCLOBENZAPRINE HCL 10 MG PO TABS
10.0000 mg | ORAL_TABLET | Freq: Three times a day (TID) | ORAL | Status: DC
  Administered 2015-12-10 – 2015-12-14 (×12): 10 mg via ORAL
  Filled 2015-12-10 (×12): qty 1

## 2015-12-10 MED ORDER — POLYETHYLENE GLYCOL 3350 17 G PO PACK
17.0000 g | PACK | Freq: Every day | ORAL | Status: DC
  Administered 2015-12-10 – 2015-12-13 (×4): 17 g via ORAL
  Filled 2015-12-10 (×5): qty 1

## 2015-12-10 MED ORDER — MOMETASONE FURO-FORMOTEROL FUM 100-5 MCG/ACT IN AERO
2.0000 | INHALATION_SPRAY | Freq: Two times a day (BID) | RESPIRATORY_TRACT | Status: DC
  Administered 2015-12-10 – 2015-12-13 (×7): 2 via RESPIRATORY_TRACT
  Filled 2015-12-10: qty 8.8

## 2015-12-10 MED ORDER — ESCITALOPRAM OXALATE 10 MG PO TABS
20.0000 mg | ORAL_TABLET | Freq: Every day | ORAL | Status: DC
  Administered 2015-12-10 – 2015-12-14 (×5): 20 mg via ORAL
  Filled 2015-12-10 (×5): qty 2

## 2015-12-10 MED ORDER — POLYETHYLENE GLYCOL 3350 17 GM/SCOOP PO POWD: 1.0000 | Freq: Every day | ORAL | Status: DC

## 2015-12-10 MED ORDER — AMANTADINE HCL 100 MG PO CAPS
100.0000 mg | ORAL_CAPSULE | Freq: Two times a day (BID) | ORAL | Status: DC
  Administered 2015-12-10 – 2015-12-14 (×9): 100 mg via ORAL
  Filled 2015-12-10 (×9): qty 1

## 2015-12-10 NOTE — Progress Notes (Signed)
D: Pt received from ED. Pt abdomen appears distended and pt c/o of hernia, which pt was able to show Probation officer. Pt has tattoo on r shoulder. Pt had small bruises on both sides of abdomen and right upper arm. Pt his dry, flaky feet and lower legs and left hand. Pt stated he hadn't had a bowel movement in 15-20 days.Pt was tired on arrival, and did not want to participate in assessment. Pt did indicated that he had been paranoid about people killing him, hearing gunshots, and a man in the woods threatening him by his group home. Pt also believed that a pt in the ED was talking about him the whole time. Pt did say "I feel safe here."  Patient alert and oriented x4. Patient denies SI/HI/AVH. Pt affect is flat. Pt c/o of back pain but did not want medication for it. Pt rated depression 5/10, anxiety 5/10. Pt appears disheveled. A: Performed skin and contraband check with Clifton Custard. Reviewed admission material with pt. Educated pt on unit policy. Oriented pt to unit. Offered sandwich tray.  Offered active listening and support. Provided therapeutic communication. R: Pt acknowledged and agreed to follow unit policies. Pt had no medications this pm, and did not want any prn medication. Pt pleasant and cooperative. Will continue Q15 min. checks. Safety maintained.

## 2015-12-10 NOTE — Progress Notes (Signed)
Recreation Therapy Notes  Date: 02.22.17 Time: 3:00 pm Location: Community Room   Group Topic: Self-esteem, Coping skills  Goal Area(s) Addresses:  Patient will identify positive traits about self. Patient will identify at least one coping skill.  Behavioral Response: Did not attend  Intervention: All About Me  Activity: Patients were instructed to make an All About Me pamphlet including their life's motto, positive traits about themselves, healthy coping skills, and their support system.  Education: LRT educated patients on ways they can increase their self-esteem.  Education Outcome: Patient did not attend group.  Clinical Observations/Feedback: Patient walked in and out of group twice.  Leonette Monarch, LRT/CTRS 12/10/2015 4:19 PM

## 2015-12-10 NOTE — Tx Team (Signed)
Initial Interdisciplinary Treatment Plan   PATIENT STRESSORS: Don't like my living situation; worried about my mom passing away   PATIENT STRENGTHS: Average or above average intelligence Motivation for treatment/growth   PROBLEM LIST: Problem List/Patient Goals Date to be addressed Date deferred Reason deferred Estimated date of resolution  Psychosis 12/10/15     Paranoia 12/10/15     Suicidal Ideations 12/10/15                                          DISCHARGE CRITERIA:  Improved stabilization in mood, thinking, and/or behavior Motivation to continue treatment in a less acute level of care  PRELIMINARY DISCHARGE PLAN: Return to previous living arrangement  PATIENT/FAMIILY INVOLVEMENT: This treatment plan has been presented to and reviewed with the patient, [name] .  The patient and family have been given the opportunity to ask questions and make suggestions.  [name] 12/10/2015, 4:42 AM

## 2015-12-10 NOTE — Progress Notes (Signed)
D: Patient remained closed to room during shift. Minimizes  His suicidal attempt. Stated it was 2 weeks ago .  Patient voice of being ready to go home . Became upset when he could not get his klonopin or pain medication .  Voice of no Auditory hallucination . No  ADL;s preformed this shift . Compliant with medication regiment  Denies suicidal  homicidal ideations  .  No auditory hallucinations Appropriate ADL'S. Interacting with peers and staff. Patient voice of chest pain.  Vital signs normal stated he needed his Materials engineer informed Dr.Hernandez  .   A: Encourage patient participation with unit programming . Instruction  Given on  Medication , verbalize understanding. Patient received medication to  Calm his nerves  R: Voice no other concerns. Staff continue to monitor

## 2015-12-10 NOTE — BHH Group Notes (Signed)
[place] Group Notes:  (Nursing/MHT/Case Management/Adjunct)  Date:  12/10/2015  Time:  2:23 PM  Type of Therapy:  Psychoeducational Skills  Participation Level:  Did Not Attend   Lorane Gell 12/10/2015, 2:23 PM

## 2015-12-10 NOTE — BHH Group Notes (Signed)
[facility] LCSW Aftercare Discharge Planning Group Note   12/10/2015 12:05 PM  Participation Quality:  Patient was called to group but did not attend.    Keene Breath, MSW, LCSWA

## 2015-12-10 NOTE — Plan of Care (Signed)
Problem: Alteration in thought process Goal: LTG-Patient has not harmed self or others in at least 2 days Outcome: Progressing Denies suicidal ideations

## 2015-12-10 NOTE — BHH Suicide Risk Assessment (Signed)
[facility] Admission Suicide Risk Assessment   Nursing information obtained from:  Patient Demographic factors:  Male, Caucasian, Low socioeconomic status, Unemployed Current Mental Status:  NA Loss Factors:  Decline in physical health Historical Factors:  Impulsivity Risk Reduction Factors:  Living with another person, especially a relative, Positive therapeutic relationship  Total Time spent with patient: 1 hour Principal Problem: Schizoaffective disorder ([place]) Diagnosis:   Patient Active Problem List   Diagnosis Date Noted  . Alcohol use disorder ([place]) [F10.99] 12/10/2015  . Benzodiazepine abuse [F13.10] 12/10/2015  . Opioid abuse [F11.10] 12/10/2015  . Constipation [K59.00] 02/06/2015  . BPH (benign prostatic hyperplasia) [N40.0] 01/16/2015  . Vitamin D deficiency [E55.9] 01/16/2015  . Allergic rhinitis [J30.9] 11/19/2014  . Chronic low back pain [M54.5, G89.29] 11/07/2014  . Onychomycosis of toenail [B35.1] 11/07/2014  . Tinea pedis [B35.3] 11/07/2014  . Psoriasis [L40.9] 10/14/2014  . HTN (hypertension) [I10] 10/14/2014  . Diabetes mellitus type 2, controlled ([place]) [E11.9] 10/14/2014  . COPD (chronic obstructive pulmonary disease) ([place]) [J44.9] 10/14/2014  . Tobacco abuse [Z72.0] 10/14/2014  . HLD (hyperlipidemia) [E78.5] 10/14/2014  . Schizoaffective disorder ([place]) [F25.9] 10/14/2014   Subjective Data:   Continued Clinical Symptoms:  Alcohol Use Disorder Identification Test Final Score (AUDIT): 0 The "Alcohol Use Disorders Identification Test", Guidelines for Use in Primary Care, Second Edition.  World Pharmacologist [facility]). Score between 0-7:  no or low risk or alcohol related problems. Score between 8-15:  moderate risk of alcohol related problems. Score between 16-19:  high risk of alcohol related problems. Score 20 or above:  warrants further diagnostic evaluation for alcohol dependence and treatment.   CLINICAL FACTORS:   Severe Anxiety and/or  Agitation Alcohol/Substance Abuse/Dependencies Schizophrenia:   Paranoid or undifferentiated type Chronic Pain Previous Psychiatric Diagnoses and Treatments   Psychiatric Specialty Exam: ROS   COGNITIVE FEATURES THAT CONTRIBUTE TO RISK:  Closed-mindedness    SUICIDE RISK:   Moderate:  Frequent suicidal ideation with limited intensity, and duration, some specificity in terms of plans, no associated intent, good self-control, limited dysphoria/symptomatology, some risk factors present, and identifiable protective factors, including available and accessible social support.  PLAN OF CARE: admit to [facility]  I certify that inpatient services furnished can reasonably be expected to improve the patient's condition.   Hildred Priest, MD 12/10/2015, 2:08 PM
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
## 2015-12-10 NOTE — ED Provider Notes (Signed)
-----------------------------------------   1:59 AM on 12/10/2015 -----------------------------------------  Patient declined nebulizer treatment; stated he was "not ready for it but will let you know when I am". Currently he is resting in no acute distress. No respiratory distress, tachypnea or wheezing noted on auscultation. Repeat metabolic panel is improved from prior. Patient is medically cleared for psychiatric disposition. Patient has been accepted to behavioral medicine unit for admission.  Paulette Blanch, MD 12/10/15 [phone]

## 2015-12-10 NOTE — [facility] Group Notes (Signed)
[facility] Group Notes:  (Nursing/MHT/Case Management/Adjunct)  Date:  12/10/2015  Time:  10:09 PM  Type of Therapy:  Wrap-up Group  Participation Level:  Did Not Attend  Participation Quality:  N/A  Affect:  N/A  Cognitive:  N/A  Insight:  None  Engagement in Group:  N/A  Modes of Intervention:  N/A  Summary of Progress/Problems:  Levonne Spiller 12/10/2015, 10:09 PM

## 2015-12-10 NOTE — Plan of Care (Signed)
Problem: Alteration in thought process Goal: STG-Patient is able to follow short directions Outcome: Progressing Pt able to follow directions to room and during assessment

## 2015-12-10 NOTE — [facility] Group Notes (Signed)
[facility] LCSW Group Therapy  12/10/2015 2:43 PM  Type of Therapy:  Group Therapy  Participation Level:  Did Not Attend  Summary of Progress/Problems: Patient was called to group but did not attend.    Keene Breath, MSW, LCSWA 12/10/2015, 2:43 PM

## 2015-12-11 LAB — GLUCOSE, CAPILLARY
GLUCOSE-CAPILLARY: 256 mg/dL — AB (ref 65–99)
GLUCOSE-CAPILLARY: 175 mg/dL — AB (ref 65–99)
Glucose-Capillary: 198 mg/dL — ABNORMAL HIGH (ref 65–99)
Glucose-Capillary: 209 mg/dL — ABNORMAL HIGH (ref 65–99)

## 2015-12-11 LAB — TSH: TSH: 1.572 u[IU]/mL (ref 0.350–4.500)

## 2015-12-11 LAB — LIPID PANEL
CHOLESTEROL: 151 mg/dL (ref 0–200)
HDL: 20 mg/dL — ABNORMAL LOW (ref >40)
LDL Cholesterol: 88 mg/dL (ref 0–99)
TRIGLYCERIDES: 213 mg/dL — AB (ref <150)
Total CHOL/HDL Ratio: 7.6 RATIO
VLDL: 43 mg/dL — AB (ref 0–40)

## 2015-12-11 LAB — HEMOGLOBIN A1C: HEMOGLOBIN A1C: 6.6 % — AB (ref 4.0–6.0)

## 2015-12-11 MED ORDER — ACETAMINOPHEN 500 MG PO TABS
1000.0000 mg | ORAL_TABLET | Freq: Four times a day (QID) | ORAL | Status: DC | PRN
  Administered 2015-12-11 – 2015-12-14 (×7): 1000 mg via ORAL
  Filled 2015-12-11 (×8): qty 2

## 2015-12-11 MED ORDER — INSULIN ASPART 100 UNIT/ML ~~LOC~~ SOLN
0.0000 [IU] | Freq: Three times a day (TID) | SUBCUTANEOUS | Status: DC
  Administered 2015-12-11 – 2015-12-12 (×2): 5 [IU] via SUBCUTANEOUS
  Administered 2015-12-13 – 2015-12-14 (×3): 3 [IU] via SUBCUTANEOUS
  Administered 2015-12-14: 2 [IU] via SUBCUTANEOUS
  Filled 2015-12-11: qty 2
  Filled 2015-12-11 (×2): qty 5

## 2015-12-11 MED ORDER — INSULIN ASPART 100 UNIT/ML ~~LOC~~ SOLN: 0.0000 [IU] | Freq: Every day | SUBCUTANEOUS | Status: DC

## 2015-12-11 MED ORDER — SENNOSIDES-DOCUSATE SODIUM 8.6-50 MG PO TABS
3.0000 | ORAL_TABLET | Freq: Once | ORAL | Status: DC
  Filled 2015-12-11: qty 3

## 2015-12-11 MED ORDER — MAGNESIUM CITRATE PO SOLN
1.0000 | Freq: Once | ORAL | Status: DC
  Administered 2015-12-11: 1 via ORAL
  Filled 2015-12-11: qty 296

## 2015-12-11 NOTE — Progress Notes (Signed)
[facility] MD Progress Note  12/11/2015 9:21 AM [name]  MRN:  [id number] Subjective:  Per nurses "Irritable, hostile, inpatient, in mood, affect flat, attention needy and demanding, bragged about drinking antifreeze to another patient, "they don't make it as sweet any more.."; refused a replacement of Lidocaine Patch as ordered, "You better leave me alone an let me sleep ...." Medicated on time, slept 7.30 hours.  Today the patient tells me he is doing fine and he wants to be discharged back to the group home. He denies any paranoia or thoughts that they were trying to kill him anymore.  Denies suicidality, homicidality or having auditory or visual hallucinations. He denies side effects from medications. He denies any physical complaints other than chronic back pain. The patient has been educated about the reasons why were not prescribed benzodiazepines or opiates. The patient it's okay with the recommendations of using a higher dose of Neurontin, using Flexeril and Lidoderm patches.  Principal Problem: Schizoaffective disorder ([place]) Diagnosis:   Patient Active Problem List   Diagnosis Date Noted  . Alcohol use disorder ([place]) [F10.99] 12/10/2015  . Benzodiazepine abuse [F13.10] 12/10/2015  . Opioid abuse [F11.10] 12/10/2015  . Constipation [K59.00] 02/06/2015  . BPH (benign prostatic hyperplasia) [N40.0] 01/16/2015  . Vitamin D deficiency [E55.9] 01/16/2015  . Allergic rhinitis [J30.9] 11/19/2014  . Chronic low back pain [M54.5, G89.29] 11/07/2014  . Onychomycosis of toenail [B35.1] 11/07/2014  . Tinea pedis [B35.3] 11/07/2014  . Psoriasis [L40.9] 10/14/2014  . HTN (hypertension) [I10] 10/14/2014  . Diabetes mellitus type 2, controlled ([place]) [E11.9] 10/14/2014  . COPD (chronic obstructive pulmonary disease) ([place]) [J44.9] 10/14/2014  . Tobacco abuse [Z72.0] 10/14/2014  . HLD (hyperlipidemia) [E78.5] 10/14/2014  . Schizoaffective disorder ([place]) [F25.9] 10/14/2014   Total Time spent with  patient: 30 minutes   Past Medical History:  Past Medical History  Diagnosis Date  . Diabetes mellitus without complication ([place]) [id number]  . Hypertension 2008  . Schizoaffective disorder ([place]) 2006   . COPD (chronic obstructive pulmonary disease) ([place])   . Alcohol dependence ([place]) 1979    stated abusing ETOH at age 56   . Benzodiazepine dependence ([place])   . Cocaine abuse   . MI (myocardial infarction) ([place])   . Cardiac arrest [facility])     Past Surgical History  Procedure Laterality Date  . Cyst excision     Family History:  Family History  Problem Relation Age of Onset  . Heart disease Mother   . Heart disease Father   . Heart disease Maternal Grandmother   . Diabetes Maternal Grandmother    Social History:  History  Alcohol Use No     History  Drug Use No    Social History   Social History  . Marital Status: Divorced    Spouse Name: N/A  . Number of Children: N/A  . Years of Education: N/A   Social History Main Topics  . Smoking status: Current Every Day Smoker -- 1.50 packs/day    Types: Cigarettes  . Smokeless tobacco: None  . Alcohol Use: No  . Drug Use: No  . Sexual Activity: No   Other Topics Concern  . None   Social History Narrative     Current Medications: Current Facility-Administered Medications  Medication Dose Route Frequency Provider Last Rate Last Dose  . acetaminophen (TYLENOL) tablet 650 mg  650 mg Oral Q6H PRN Hildred Priest, MD      . albuterol (PROVENTIL HFA;VENTOLIN HFA) 108 (90 Base) MCG/ACT inhaler  2 puff  2 puff Inhalation Q6H PRN Hildred Priest, MD      . amantadine (SYMMETREL) capsule 100 mg  100 mg Oral BID Hildred Priest, MD   100 mg at 12/11/15 [id number]  . amitriptyline (ELAVIL) tablet 25 mg  25 mg Oral QHS Hildred Priest, MD   25 mg at 12/10/15 2205  . aspirin EC tablet 81 mg  81 mg Oral Daily Hildred Priest, MD   81 mg at 12/11/15 [id number]  . atorvastatin (LIPITOR) tablet 40 mg   40 mg Oral Daily Hildred Priest, MD   40 mg at 12/11/15 0820  . cyclobenzaprine (FLEXERIL) tablet 10 mg  10 mg Oral TID Hildred Priest, MD   10 mg at 12/11/15 [id number]  . escitalopram (LEXAPRO) tablet 20 mg  20 mg Oral Daily Hildred Priest, MD   20 mg at 12/11/15 0819  . finasteride (PROSCAR) tablet 5 mg  5 mg Oral Daily Hildred Priest, MD   5 mg at 12/11/15 [id number]  . fluticasone (FLONASE) 50 MCG/ACT nasal spray 2 spray  2 spray Each Nare Daily Hildred Priest, MD   2 spray at 12/11/15 [phone]  . gabapentin (NEURONTIN) capsule 400 mg  400 mg Oral TID Hildred Priest, MD   400 mg at 12/11/15 [id number]  . hydrOXYzine (ATARAX/VISTARIL) tablet 50 mg  50 mg Oral Q6H PRN Hildred Priest, MD   50 mg at 12/10/15 1447  . insulin aspart (novoLOG) injection 0-5 Units  0-5 Units Subcutaneous QHS Hildred Priest, MD   2 Units at 12/10/15 2157  . insulin aspart (novoLOG) injection 0-9 Units  0-9 Units Subcutaneous TID WC Hildred Priest, MD   2 Units at 12/11/15 [phone]  . insulin glargine (LANTUS) injection 30 Units  30 Units Subcutaneous Q2200 Hildred Priest, MD   30 Units at 12/10/15 2159  . isosorbide mononitrate (IMDUR) 24 hr tablet 30 mg  30 mg Oral Daily Hildred Priest, MD   30 mg at 12/11/15 [id number]  . lidocaine (LIDODERM) 5 % 1 patch  1 patch Transdermal Q24H Hildred Priest, MD   1 patch at 12/10/15 1229  . lisinopril (PRINIVIL,ZESTRIL) tablet 10 mg  10 mg Oral Daily Hildred Priest, MD   10 mg at 12/11/15 [id number]  . lubiprostone (AMITIZA) capsule 24 mcg  24 mcg Oral Daily Hildred Priest, MD   24 mcg at 12/11/15 [phone]  . magnesium hydroxide (MILK OF MAGNESIA) suspension 30 mL  30 mL Oral Daily PRN Hildred Priest, MD      . metoprolol tartrate (LOPRESSOR) tablet 25 mg  25 mg Oral BID Hildred Priest, MD   25 mg at 12/11/15 [id number]  . mometasone-formoterol (DULERA)  100-5 MCG/ACT inhaler 2 puff  2 puff Inhalation BID Hildred Priest, MD   2 puff at 12/11/15 0840  . nicotine (NICODERM CQ - dosed in mg/24 hours) patch 21 mg  21 mg Transdermal Daily Hildred Priest, MD   21 mg at 12/11/15 [id number]  . NIFEdipine (PROCARDIA-XL/ADALAT CC) 24 hr tablet 90 mg  90 mg Oral Daily Hildred Priest, MD   90 mg at 12/11/15 0820  . pantoprazole (PROTONIX) EC tablet 40 mg  40 mg Oral Daily Hildred Priest, MD   40 mg at 12/11/15 [id number]  . polyethylene glycol (MIRALAX / GLYCOLAX) packet 17 g  17 g Oral Daily Hildred Priest, MD   17 g at 12/11/15 [id number]  . thiothixene (NAVANE) capsule 10 mg  10 mg Oral QHS Hildred Priest, MD   10 mg at 12/10/15 2151  . torsemide (  DEMADEX) tablet 20 mg  20 mg Oral BID Hildred Priest, MD   20 mg at 12/11/15 0840  . triamcinolone cream (KENALOG) 0.5 %   Topical BID Hildred Priest, MD      . Vitamin D (Ergocalciferol) (DRISDOL) capsule 50,000 Units  50,000 Units Oral Q7 days Hildred Priest, MD   50,000 Units at 12/10/15 1025    Lab Results:  Results for orders placed or performed during the hospital encounter of 12/10/15 (from the past 48 hour(s))  Glucose, capillary     Status: Abnormal   Collection Time: 12/10/15 12:23 PM  Result Value Ref Range   Glucose-Capillary 370 (H) 65 - 99 mg/dL   Comment 1 Document in Chart   Glucose, capillary     Status: Abnormal   Collection Time: 12/10/15  4:12 PM  Result Value Ref Range   Glucose-Capillary 242 (H) 65 - 99 mg/dL   Comment 1 Document in Chart   Glucose, capillary     Status: Abnormal   Collection Time: 12/10/15  8:45 PM  Result Value Ref Range   Glucose-Capillary 249 (H) 65 - 99 mg/dL  Glucose, capillary     Status: Abnormal   Collection Time: 12/11/15  7:01 AM  Result Value Ref Range   Glucose-Capillary 198 (H) 65 - 99 mg/dL  Lipid panel     Status: Abnormal   Collection Time: 12/11/15  7:18 AM   Result Value Ref Range   Cholesterol 151 0 - 200 mg/dL   Triglycerides 213 (H) <150 mg/dL   HDL 20 (L) >40 mg/dL   Total CHOL/HDL Ratio 7.6 RATIO   VLDL 43 (H) 0 - 40 mg/dL   LDL Cholesterol 88 0 - 99 mg/dL    Comment:        Total Cholesterol/HDL:CHD Risk Coronary Heart Disease Risk Table                     Men   Women  1/2 Average Risk   3.4   3.3  Average Risk       5.0   4.4  2 X Average Risk   9.6   7.1  3 X Average Risk  23.4   11.0        Use the calculated Patient Ratio above and the CHD Risk Table to determine the patient's CHD Risk.        ATP III CLASSIFICATION (LDL):  <100     mg/dL   Optimal  100-129  mg/dL   Near or Above                    Optimal  130-159  mg/dL   Borderline  160-189  mg/dL   High  >190     mg/dL   Very High   TSH     Status: None   Collection Time: 12/11/15  7:18 AM  Result Value Ref Range   TSH 1.572 0.350 - 4.500 uIU/mL    Blood Alcohol level:  Lab Results  Component Value Date   ETH <5 12/09/2015   ETH <5 09/12/2015    Physical Findings: AIMS: Facial and Oral Movements Muscles of Facial Expression: None, normal Lips and Perioral Area: None, normal Jaw: None, normal Tongue: None, normal,Extremity Movements Upper (arms, wrists, hands, fingers): None, normal Lower (legs, knees, ankles, toes): None, normal, Trunk Movements Neck, shoulders, hips: None, normal, Overall Severity Severity of abnormal movements (highest score from questions above): None, normal Incapacitation due to abnormal  movements: None, normal Patient's awareness of abnormal movements (rate only patient's report): No Awareness, Dental Status Current problems with teeth and/or dentures?: No Does patient usually wear dentures?: No  CIWA:  CIWA-Ar Total: 1 COWS:  COWS Total Score: 1  Musculoskeletal: Strength & Muscle Tone: within normal limits Gait & Station: normal Patient leans: N/A  Psychiatric Specialty Exam: Review of Systems  Constitutional:  Negative.   Eyes: Negative.   Respiratory: Negative.   Cardiovascular: Negative.   Gastrointestinal: Negative.   Genitourinary: Negative.   Musculoskeletal: Negative.   Skin: Negative.   Neurological: Positive for headaches.  Endo/Heme/Allergies: Negative.   Psychiatric/Behavioral: Negative.     Blood pressure 130/71, pulse 67, temperature 98 F (36.7 C), temperature source Oral, resp. rate 18, height 5\' 11"  (1.803 m), weight 102.513 kg (226 lb), SpO2 96 %.Body mass index is 31.53 kg/(m^2).  General Appearance: Disheveled  Eye Contact::  Minimal  Speech:  Normal Rate  Volume:  Normal  Mood:  Euthymic  Affect:  Constricted  Thought Process:  Linear  Orientation:  Full (Time, Place, and Person)  Thought Content:  Paranoid Ideation  Suicidal Thoughts:  No  Homicidal Thoughts:  No  Memory:  Immediate;   Fair Recent;   Fair Remote;   Fair  Judgement:  Poor  Insight:  Shallow  Psychomotor Activity:  Normal  Concentration:  Fair  Recall:  [place]: Fair  Akathisia:  No  Handed:    AIMS (if indicated):     Assets:  Agricultural consultant Housing  ADL's:  Intact  Cognition: WNL  Sleep:  Number of Hours: 7.3   Treatment Plan Summary:  Schizoaffective disorder: Continue Invega Sustenna 234 mg every 28 days. ACT will contact us to let us know when the patient will be due for his next injection. They believe patient has been compliant with injectables. I will add a second antipsychotic as patient is is still having significant paranoia on maximum dose of Invega. I will add Navane 10 mg by mouth daily at bedtime. Patient is also on Lexapro 20 mg a day.  Patient was compliant with navane last night.  Today the patient reported resolution of his paranoia. Staff and myself suspected that the patient's goal  for this admission was to be prescribed benzodiazepine and opiates  EPS: Continue amantadine 100 mg by mouth twice a  day  Insomnia and anxiety: I will order Vistaril 50 mg every 6 hours as needed. Continue Elavil 25 mg at bedtime  Diabetes type 2: Continue Lantus 30 units daily at bedtime. I have ordered supplemental scale insulin.  Hypertension and kidney in dirt 30 mg daily Toprol 25 mg Procardia 90 mg daily  Cardiovascular health: Daily aspirin 81 mg a day  Dyslipidemia continue Lipitor 40 mg daily  Edema: Continue torsemide 20 mg twice a day  Vitamin D deficiency continue vitamin D 50,000 units   Constipation: No MiraLAX daily along with Amitiza daily  Chronic back pain: I have ordered Flexeril, Lidoderm patch when necessary I have increased Neurontin to 400 mg 3 times a day  BPH: Continue Proscar   COPD: On dulera and albuterol.  GERD: Continue Protonix  Tobacco use disorder: Continue nicotine patch 21 mg a day.  Diet carb modified and low sodium  Precautions every 15 minute checks  Hospitalization and status continue involuntary commitment  Labs I will order hemoglobin A1c, lipid panel, TSH and prolactin level tomorrow a.m.----TSH is normal, triglycerides are elevated, hemoglobin A1c  is pending  Disposition patient is to return to his group home once a stable--- possible discharge back to the group home early next week   Follow-up the patient will continue to follow-up with ACT  Hildred Priest, MD 12/11/2015, 9:21 AM

## 2015-12-11 NOTE — Progress Notes (Signed)
D: Patient  Remained close to his room . Complained of  Back pain . Lying in bed all shift  Limited interaction with his peers , Appetite fair . Continue to voice of wanting to go home. Paced hall through out shift . Voice of constipation. No participation with unit programing . Limited insight into behavior .Denies suicidal ideations.  A: Encourage patient participation with unit programming . Instruction  Given on  Medication , verbalize understanding.Instructed  MD on patient constipastion needs  R: Voice no other concerns. Staff continue to monitor

## 2015-12-11 NOTE — Plan of Care (Signed)
Problem: Alteration in thought process Goal: LTG-Patient behavior demonstrates decreased signs psychosis (Patient behavior demonstrates decreased signs of psychosis to the point the patient is safe to return home and continue treatment in an outpatient setting.)  Outcome: Progressing No psychosis noted.

## 2015-12-11 NOTE — Tx Team (Signed)
Interdisciplinary Treatment Plan Update (Adult)  Date:  12/11/2015 Time Reviewed:  4:54 PM  Progress in Treatment: Attending groups: Yes. Participating in groups:  Yes. Taking medication as prescribed:  Yes. Tolerating medication:  Yes. Family/Significant othe contact made:  Yes, individual(s) contacted:  group home and [facility]  Patient understands diagnosis:  Yes. Discussing patient identified problems/goals with staff:  Yes. Medical problems stabilized or resolved:  Yes. Denies suicidal/homicidal ideation: Yes. Issues/concerns per patient self-inventory:  Yes. Other:  New problem(s) identified: No, Describe:  NA  Discharge Plan or Barriers: Pt plans to return home and follow up with outpatient.  Pt can return to group home.   Reason for Continuation of Hospitalization: Delusions  Hallucinations Medication stabilization Other; describe Paranoia  Comments:Per nurses "Irritable, hostile, inpatient, in mood, affect flat, attention needy and demanding, bragged about drinking antifreeze to another patient, "they don't make it as sweet any more.."; refused a replacement of Lidocaine Patch as ordered, "You better leave me alone an let me sleep ...." Medicated on time, slept 7.30 hours. Today the patient tells me he is doing fine and he wants to be discharged back to the group home. He denies any paranoia or thoughts that they were trying to kill him anymore. Denies suicidality, homicidality or having auditory or visual hallucinations. He denies side effects from medications. He denies any physical complaints other than chronic back pain. The patient has been educated about the reasons why were not prescribed benzodiazepines or opiates. The patient it's okay with the recommendations of using a higher dose of Neurontin, using Flexeril and Lidoderm patches.  Estimated length of stay: 3 days   New goal(s):NA  Review of initial/current patient goals per problem list:   1.  Goal(s): Patient will  participate in aftercare plan * Met:  * Target date: at discharge * As evidenced by: Patient will participate within aftercare plan AEB aftercare provider and housing plan at discharge being identified.   2.  Goal (s): Patient will exhibit decreased depressive symptoms and suicidal ideations. * Met:  *  Target date: at discharge * As evidenced by: Patient will utilize self rating of depression at 3 or below and demonstrate decreased signs of depression or be deemed stable for discharge by MD.  3. Goal (s): Patient will demonstrate decreased symptoms of psychosis. * Met: No  *  Target date: at discharge * As evidenced by: Patient will not endorse signs of psychosis or be deemed stable for discharge by MD.   Attendees: Patient:  [name] 2/23/20174:54 PM  Family:   2/23/20174:54 PM  Physician:  Dr. Jerilee Hoh   2/23/20174:54 PM  Nursing:   Meredith Mody, RN  2/23/20174:54 PM  Case Manager:   2/23/20174:54 PM  Counselor:   2/23/20174:54 PM  Other:  Wray Kearns, [place] 2/23/20174:54 PM  Other:  Everitt Amber, [place]  2/23/20174:54 PM  Other:   2/23/20174:54 PM  Other:  2/23/20174:54 PM  Other:  2/23/20174:54 PM  Other:  2/23/20174:54 PM  Other:  2/23/20174:54 PM  Other:  2/23/20174:54 PM  Other:  2/23/20174:54 PM  Other:   2/23/20174:54 PM   Scribe for Treatment Team:   Wray Kearns, MSW, LCSWA  12/11/2015, 4:54 PM

## 2015-12-11 NOTE — Plan of Care (Signed)
Problem: Alteration in thought process Goal: LTG-Patient is able to perceive the environment accurately Outcome: Progressing Aware of surrounding, identified staff .

## 2015-12-11 NOTE — BHH Group Notes (Signed)
[place] LCSW Group Therapy  12/11/2015 4:06 PM  Type of Therapy:  Group Therapy  Participation Level:  Did Not Attend  Summary of Progress/Problems: Patient was called to group but did not attend.  Keene Breath, MSW, LCSWA 12/11/2015, 4:06 PM

## 2015-12-11 NOTE — Progress Notes (Signed)
D: Pt denies SI/HI/AVH. Pt is cooperative with care. Affect is flat and sad, he appears disheveled and has body odor. Patient was encouraged to take a shower tonight. Pt appears less anxious and he is interacting with peers and staff appropriately.  A: Pt was offered support and encouragement. Pt was given scheduled medications. Pt was encouraged to attend groups. Q 15 minute checks were done for safety.  R:Pt attended groups and interacts well with peers and staff. Pt is taking medication. Pt receptive to treatment and safety maintained on unit.

## 2015-12-11 NOTE — BHH Group Notes (Signed)
[place] Group Notes:  (Nursing/MHT/Case Management/Adjunct)  Date:  12/11/2015  Time:  2:06 PM  Type of Therapy:  Movement Therapy  Participation Level:  Did Not Attend  Shalynn Jorstad De'Chelle Garrin Kirwan 12/11/2015, 2:06 PM

## 2015-12-11 NOTE — Plan of Care (Signed)
Problem: Ineffective individual coping Goal: STG: Patient will remain free from self harm Outcome: Progressing 15 minute checks maintained for safety, clinical and moral support provided, patient encouraged to continue to express feelings and demonstrate safe care. Patient remain free from harm, will continue to monitor.

## 2015-12-11 NOTE — Progress Notes (Signed)
Recreation Therapy Notes  Date: 02.23.17 Time: 3:00 pm Location: Community Room  Group Topic: Leisure Education  Goal Area(s) Addresses:  Patient will identify activities for each letter of the alphabet. Patient will verbalize ability to integrate positive leisure into life post d/c. Patient will verbalize ability to use leisure as a Technical sales engineer.  Behavioral Response: Did not attend  Intervention: Leisure Alphabet  Activity: Patients were given a Leisure Alphabet and instructed to think of a healthy leisure activity for each letter of the alphabet.  Education: LRT educated patients on what they need to participate in leisure.  Education Outcome: Patient did not attend group.  Clinical Observations/Feedback: Patient did not attend group.  Leonette Monarch, LRT/CTRS 12/11/2015 4:26 PM

## 2015-12-11 NOTE — Progress Notes (Signed)
Irritable, hostile, inpatient, in mood, affect flat, attention needy and demanding, bragged about drinking antifreeze to another patient, "they don't make it as sweet any more.."; refused a replacement of Lidocaine Patch as ordered, "You better leave me alone an let me sleep ...." Medicated on time, slept 7.30 hours.

## 2015-12-11 NOTE — Progress Notes (Signed)
Recreation Therapy Notes  INPATIENT RECREATION THERAPY ASSESSMENT  Patient Details Name: [name] MRN: [id number] DOB: 17-Aug-1961 Today's Date: 12/11/2015  Patient Stressors: Family, Death, Friends, Other (Comment) (Good relationship with family, but doesn't get to see them; aunt passed away recently; lack of supportive friends; two men at the group home worry him)  Coping Skills:   Arguments, Avoidance, Art/Dance, Talking, Music, Sports, Other (Comment) (Smoke cigarettes)  Personal Challenges: Communication, Concentration, Decision-Making, Self-Esteem/Confidence, Trusting Others (Patient answered "I don't know" to most of these questions)  Leisure Interests (2+):  Music - Listen, Individual - Other (Comment) (Smoke cigarettes)  Awareness of Community Resources:  No  Community Resources:     Current Use:    If no, Barriers?:    Patient Strengths:  "I don't know"  Patient Identified Areas of Improvement:  "I don't know"  Current Recreation Participation:  Nothing  Patient Goal for Hospitalization:  "I don't know"  [place] of Residence:  [place] of Residence:  [place]   Current [place] (including self-harm):  No  Current [place]:  No  Consent to Intern Participation: N/A  Patient refused one-to-one treatment sessions. Due to patient's refusal, LRT will not develop a Recreational Therapy Care Plan at this time. If patient's status changes, LRT will develop a Recreational Therapy Care Plan.  Leonette Monarch, LRT/CTRS 12/11/2015, 1:32 PM

## 2015-12-12 LAB — GLUCOSE, CAPILLARY
GLUCOSE-CAPILLARY: 153 mg/dL — AB (ref 65–99)
GLUCOSE-CAPILLARY: 212 mg/dL — AB (ref 65–99)
Glucose-Capillary: 100 mg/dL — ABNORMAL HIGH (ref 65–99)
Glucose-Capillary: 164 mg/dL — ABNORMAL HIGH (ref 65–99)

## 2015-12-12 LAB — PROLACTIN: Prolactin: 70 ng/mL — ABNORMAL HIGH (ref 4.0–15.2)

## 2015-12-12 MED ORDER — ACETAMINOPHEN 500 MG PO TABS: 1000.0000 mg | ORAL_TABLET | Freq: Four times a day (QID) | ORAL | Status: DC | PRN

## 2015-12-12 MED ORDER — GABAPENTIN 400 MG PO CAPS: 400.0000 mg | ORAL_CAPSULE | Freq: Three times a day (TID) | ORAL | Status: DC

## 2015-12-12 MED ORDER — LIDOCAINE 5 % EX PTCH: 1.0000 | MEDICATED_PATCH | CUTANEOUS | Status: DC

## 2015-12-12 MED ORDER — THIOTHIXENE 10 MG PO CAPS
10.0000 mg | ORAL_CAPSULE | Freq: Every day | ORAL | Status: DC
Start: 2015-12-12 — End: 2016-01-02

## 2015-12-12 MED ORDER — AMANTADINE HCL 100 MG PO CAPS: 100.0000 mg | ORAL_CAPSULE | Freq: Two times a day (BID) | ORAL | Status: DC

## 2015-12-12 MED ORDER — CYCLOBENZAPRINE HCL 10 MG PO TABS: 10.0000 mg | ORAL_TABLET | Freq: Three times a day (TID) | ORAL | Status: DC

## 2015-12-12 NOTE — B[facility] Group Notes (Signed)
[facility] LCSW Group Therapy  12/12/2015 3:08 PM  Patient was called to group but did not attend.  Christa See, CSW Intern 12/12/2015, 3:08 PM  Carmell Austria, MSW, LCSWA 12/12/2015, 3:11 PM

## 2015-12-12 NOTE — BHH Counselor (Signed)
Adult Comprehensive Assessment  Patient ID: [name], male   DOB: 12-27-1960, 55 y.o.   MRN: [id number]  Information Source: Information source: Patient  Current Stressors:  Educational / Learning stressors: Pt did not finish high school  Employment / Job issues: Pt receives SSDI  Family Relationships: Pt family does not live nearby.  Financial / Lack of resources (include bankruptcy): Limited income.  Housing / Lack of housing: Pt lives in a group home.  Physical health (include injuries & life threatening diseases): Pt reports chronic back pain.  Social relationships: None reported  Substance abuse: Pt denies use. However, he has a history of abusing alcohol and anti- freeze.  Bereavement / Loss: None reported   Living/Environment/Situation:  Living Arrangements: Group Home Living conditions (as described by patient or guardian): Pt lives in [place]  How long has patient lived in current situation?: Since Sept. 2016 What is atmosphere in current home: Supportive, Comfortable  Family History:  Marital status: Divorced Divorced, when?: Over 6 years ago  What types of issues is patient dealing with in the relationship?: "She took off with another man"  Are you sexually active?: No What is your sexual orientation?: Heterosexual  Has your sexual activity been affected by drugs, alcohol, medication, or emotional stress?: None reported  Does patient have children?: Yes How many children?: 2 How is patient's relationship with their children?: Adult son and daughter, No contact.  Childhood History:  By whom was/is the patient raised?: Mother Additional childhood history information: Pt's father left when he was 85 years old.  Description of patient's relationship with caregiver when they were a child: Good relationship with mother.  Patient's description of current relationship with people who raised him/her: Good relationship with mother.  How were you  disciplined when you got in trouble as a child/adolescent?: None reported  Does patient have siblings?: Yes Number of Siblings: 1 Description of patient's current relationship with siblings: Sister; good relationship Did patient suffer any verbal/emotional/physical/sexual abuse as a child?: No Did patient suffer from severe childhood neglect?: No Has patient ever been sexually abused/assaulted/raped as an adolescent or adult?: No Was the patient ever a victim of a crime or a disaster?: No Witnessed domestic violence?: No Has patient been effected by domestic violence as an adult?: No  Education:  Highest grade of school patient has completed: 9th Currently a student?: No Learning disability?: No  Employment/Work Situation:   Employment situation: On disability Why is patient on disability: Schizoaffective disorder  How long has patient been on disability: "A long time"  Patient's job has been impacted by current illness: No What is the longest time patient has a held a job?: NA Where was the patient employed at that time?: NA Has patient ever been in the [employer]?: No  Financial Resources:   Museum/gallery curator resources: Teacher, early years/pre, [employer], Medicare Does patient have a Programmer, applications or guardian?: Yes Name of representative payee or guardian: [name] [facility] is his payee   Alcohol/Substance Abuse:   What has been your use of drugs/alcohol within the last 12 months?: Pt denies use. However, he has a history drinking alcohol and anti freeze.  If attempted suicide, did drugs/alcohol play a role in this?: No Alcohol/Substance Abuse Treatment Hx: Denies past history Has alcohol/substance abuse ever caused legal problems?: No  Social Support System:   Patient's Community Support System: Good Describe Community Support System: Family, [facility], group home  Type of faith/religion: Christainity  How does patient's faith help to cope  with current illness?: Prayer   Leisure/Recreation:    Leisure and Hobbies: fishing   Strengths/Needs:   What things does the patient do well?: Fishing  In what areas does patient struggle / problems for patient: "I got scared because people were shooting outside."   Discharge Plan:   Does patient have access to transportation?: Yes Will patient be returning to same living situation after discharge?: Yes Currently receiving community mental health services: Yes (From Whom) ([facility]) Does patient have financial barriers related to discharge medications?: No  Summary/Recommendations:    Patient is a 55 year old male admitted  with a diagnosis of Schizoaffective Disorder. Patient presented to the hospital with paranoia, agitation and refusing medications. Patient reports primary triggers for admission were non compliance of medications. Pt is living in a group home in [facility] [place], [place]. He receives outpatient services with [facility]. Pt plans to return home and follow up with outpatient.  Patient will benefit from crisis stabilization, medication evaluation, group therapy and psycho education in addition to case management for discharge. At discharge, it is recommended that patient remain compliant with established discharge plan and continued treatment.   [place].MSW, [facility]  12/12/2015

## 2015-12-12 NOTE — Progress Notes (Signed)
Patient was kind of irritable this morning,refused breakfast & insulin.Later he took a shower & more visible in the milieu.C/o back pain but helped with medications.Denies suicidal or homicidal ideations or AV hallucinations.Refused lunch,had soda.Gave insulin as per Dr.Hernandez.Looking forward for discharge tomorrow.

## 2015-12-12 NOTE — BHH Group Notes (Signed)
[place] Group Notes:  (Nursing/MHT/Case Management/Adjunct)  Date:  12/12/2015  Time:  1:15 AM  Type of Therapy:  Psychoeducational Skills  Participation Level:  Active  Participation Quality:  Appropriate and Sharing  Affect:  Appropriate  Cognitive:  Oriented  Insight:  Good  Engagement in Group:  Improving  Modes of Intervention:  Clarification and Discussion  Summary of Progress/Problems:  Gage Treiber R Jonalyn Sedlak 12/12/2015, 1:15 AM

## 2015-12-12 NOTE — Progress Notes (Signed)
[facility] MD Progress Note  12/12/2015 9:36 AM DONNA DECOTEAU  MRN:  [id number] Subjective:  Today the patient was calm, pleasant and cooperative. The patient has been compliant with medications. He has not displayed disruptive behaviors. The patient is tolerating medications well and denies side effects. As far as physical complaints the patient appears to be med seeking he keeps up with me on the hallways telling me that he is having withdrawal from benzodiazepines, that he feels his throat is closing in, and that he is having severe back pain. If the patient is reporting these complaints he is seen walking around the unit with not difficulties he does not appear to be in severe distress. His vital signs are within the normal limits. There is not evidence of paranoia or delusional thinking at this time. We will consider discharging him back to his group home tomorrow.  On 2/23 The patient tells me he is doing fine and he wants to be discharged back to the group home. He denies any paranoia or thoughts that they were trying to kill him anymore.  Denies suicidality, homicidality or having auditory or visual hallucinations. He denies side effects from medications. He denies any physical complaints other than chronic back pain. The patient has been educated about the reasons why we are not prescribing benzodiazepines or opiates. The patient it's okay with the recommendations of using a higher dose of Neurontin, using Flexeril and Lidoderm patches.  Per nurses on 2/22 "Irritable, hostile, inpatient, in mood, affect flat, attention needy and demanding, bragged about drinking antifreeze to another patient, "they don't make it as sweet any more.."; refused a replacement of Lidocaine Patch as ordered, "You better leave me alone an let me sleep ...." Medicated on time, slept 7.30 hours.    Per nursing: D: Pt denies SI/HI/AVH. Pt is cooperative with care. Affect is flat and sad, he appears disheveled and has body odor.  Patient was encouraged to take a shower tonight. Pt appears less anxious and he is interacting with peers and staff appropriately.  A: Pt was offered support and encouragement. Pt was given scheduled medications. Pt was encouraged to attend groups. Q 15 minute checks were done for safety.  R:Pt attended groups and interacts well with peers and staff. Pt is taking medication. Pt receptive to treatment and safety maintained on unit.  Principal Problem: Schizoaffective disorder ([place]) Diagnosis:   Patient Active Problem List   Diagnosis Date Noted  . Alcohol use disorder ([place]) [F10.99] 12/10/2015  . Benzodiazepine abuse [F13.10] 12/10/2015  . Opioid abuse [F11.10] 12/10/2015  . Constipation [K59.00] 02/06/2015  . BPH (benign prostatic hyperplasia) [N40.0] 01/16/2015  . Vitamin D deficiency [E55.9] 01/16/2015  . Allergic rhinitis [J30.9] 11/19/2014  . Chronic low back pain [M54.5, G89.29] 11/07/2014  . Onychomycosis of toenail [B35.1] 11/07/2014  . Tinea pedis [B35.3] 11/07/2014  . Psoriasis [L40.9] 10/14/2014  . HTN (hypertension) [I10] 10/14/2014  . Diabetes mellitus type 2, controlled ([place]) [E11.9] 10/14/2014  . COPD (chronic obstructive pulmonary disease) ([place]) [J44.9] 10/14/2014  . Tobacco abuse [Z72.0] 10/14/2014  . HLD (hyperlipidemia) [E78.5] 10/14/2014  . Schizoaffective disorder ([place]) [F25.9] 10/14/2014   Total Time spent with patient: 30 minutes   Past Medical History:  Past Medical History  Diagnosis Date  . Diabetes mellitus without complication ([place]) [id number]  . Hypertension 2008  . Schizoaffective disorder ([place]) 2006   . COPD (chronic obstructive pulmonary disease) ([place])   . Alcohol dependence ([place]) 1979    stated abusing ETOH at age  17   . Benzodiazepine dependence ([place])   . Cocaine abuse   . MI (myocardial infarction) ([place])   . Cardiac arrest [facility])     Past Surgical History  Procedure Laterality Date  . Cyst excision     Family History:  Family History   Problem Relation Age of Onset  . Heart disease Mother   . Heart disease Father   . Heart disease Maternal Grandmother   . Diabetes Maternal Grandmother    Social History:  History  Alcohol Use No     History  Drug Use No    Social History   Social History  . Marital Status: Divorced    Spouse Name: N/A  . Number of Children: N/A  . Years of Education: N/A   Social History Main Topics  . Smoking status: Current Every Day Smoker -- 1.50 packs/day    Types: Cigarettes  . Smokeless tobacco: None  . Alcohol Use: No  . Drug Use: No  . Sexual Activity: No   Other Topics Concern  . None   Social History Narrative     Current Medications: Current Facility-Administered Medications  Medication Dose Route Frequency Provider Last Rate Last Dose  . acetaminophen (TYLENOL) tablet 1,000 mg  1,000 mg Oral Q6H PRN Hildred Priest, MD   1,000 mg at 12/11/15 1841  . albuterol (PROVENTIL HFA;VENTOLIN HFA) 108 (90 Base) MCG/ACT inhaler 2 puff  2 puff Inhalation Q6H PRN Hildred Priest, MD      . amantadine (SYMMETREL) capsule 100 mg  100 mg Oral BID Hildred Priest, MD   100 mg at 12/11/15 2152  . amitriptyline (ELAVIL) tablet 25 mg  25 mg Oral QHS Hildred Priest, MD   25 mg at 12/11/15 2152  . aspirin EC tablet 81 mg  81 mg Oral Daily Hildred Priest, MD   81 mg at 12/11/15 [id number]  . atorvastatin (LIPITOR) tablet 40 mg  40 mg Oral Daily Hildred Priest, MD   40 mg at 12/11/15 0820  . cyclobenzaprine (FLEXERIL) tablet 10 mg  10 mg Oral TID Hildred Priest, MD   10 mg at 12/11/15 2152  . escitalopram (LEXAPRO) tablet 20 mg  20 mg Oral Daily Hildred Priest, MD   20 mg at 12/11/15 0819  . finasteride (PROSCAR) tablet 5 mg  5 mg Oral Daily Hildred Priest, MD   5 mg at 12/11/15 [id number]  . fluticasone (FLONASE) 50 MCG/ACT nasal spray 2 spray  2 spray Each Nare Daily Hildred Priest, MD   2 spray at  12/11/15 [phone]  . gabapentin (NEURONTIN) capsule 400 mg  400 mg Oral TID Hildred Priest, MD   400 mg at 12/11/15 2150  . hydrOXYzine (ATARAX/VISTARIL) tablet 50 mg  50 mg Oral Q6H PRN Hildred Priest, MD   50 mg at 12/11/15 1044  . insulin aspart (novoLOG) injection 0-15 Units  0-15 Units Subcutaneous TID WC Hildred Priest, MD   5 Units at 12/11/15 1628  . insulin aspart (novoLOG) injection 0-5 Units  0-5 Units Subcutaneous QHS Hildred Priest, MD   0 Units at 12/11/15 2154  . insulin glargine (LANTUS) injection 30 Units  30 Units Subcutaneous Q2200 Hildred Priest, MD   30 Units at 12/11/15 2153  . isosorbide mononitrate (IMDUR) 24 hr tablet 30 mg  30 mg Oral Daily Hildred Priest, MD   30 mg at 12/11/15 [id number]  . lidocaine (LIDODERM) 5 % 1 patch  1 patch Transdermal Q24H Hildred Priest, MD   1 patch at 12/11/15 1144  .  lisinopril (PRINIVIL,ZESTRIL) tablet 10 mg  10 mg Oral Daily Hildred Priest, MD   10 mg at 12/11/15 [id number]  . lubiprostone (AMITIZA) capsule 24 mcg  24 mcg Oral Daily Hildred Priest, MD   24 mcg at 12/11/15 [id number]  . magnesium hydroxide (MILK OF MAGNESIA) suspension 30 mL  30 mL Oral Daily PRN Hildred Priest, MD      . metoprolol tartrate (LOPRESSOR) tablet 25 mg  25 mg Oral BID Hildred Priest, MD   25 mg at 12/11/15 [id number]  . mometasone-formoterol (DULERA) 100-5 MCG/ACT inhaler 2 puff  2 puff Inhalation BID Hildred Priest, MD   2 puff at 12/11/15 2000  . nicotine (NICODERM CQ - dosed in mg/24 hours) patch 21 mg  21 mg Transdermal Daily Hildred Priest, MD   21 mg at 12/11/15 [id number]  . NIFEdipine (PROCARDIA-XL/ADALAT CC) 24 hr tablet 90 mg  90 mg Oral Daily Hildred Priest, MD   90 mg at 12/11/15 0820  . pantoprazole (PROTONIX) EC tablet 40 mg  40 mg Oral Daily Hildred Priest, MD   40 mg at 12/11/15 [id number]  . polyethylene glycol (MIRALAX /  GLYCOLAX) packet 17 g  17 g Oral Daily Hildred Priest, MD   17 g at 12/11/15 [id number]  . senna-docusate (Senokot-S) tablet 3 tablet  3 tablet Oral Once Hildred Priest, MD   3 tablet at 12/11/15 1841  . thiothixene (NAVANE) capsule 10 mg  10 mg Oral QHS Hildred Priest, MD   10 mg at 12/11/15 2150  . torsemide (DEMADEX) tablet 20 mg  20 mg Oral BID Hildred Priest, MD   20 mg at 12/11/15 1708  . triamcinolone cream (KENALOG) 0.5 %   Topical BID Hildred Priest, MD      . Vitamin D (Ergocalciferol) (DRISDOL) capsule 50,000 Units  50,000 Units Oral Q7 days Hildred Priest, MD   50,000 Units at 12/10/15 1025    Lab Results:  Results for orders placed or performed during the hospital encounter of 12/10/15 (from the past 48 hour(s))  Glucose, capillary     Status: Abnormal   Collection Time: 12/10/15 12:23 PM  Result Value Ref Range   Glucose-Capillary 370 (H) 65 - 99 mg/dL   Comment 1 Document in Chart   Glucose, capillary     Status: Abnormal   Collection Time: 12/10/15  4:12 PM  Result Value Ref Range   Glucose-Capillary 242 (H) 65 - 99 mg/dL   Comment 1 Document in Chart   Glucose, capillary     Status: Abnormal   Collection Time: 12/10/15  8:45 PM  Result Value Ref Range   Glucose-Capillary 249 (H) 65 - 99 mg/dL  Glucose, capillary     Status: Abnormal   Collection Time: 12/11/15  7:01 AM  Result Value Ref Range   Glucose-Capillary 198 (H) 65 - 99 mg/dL  Hemoglobin A1c     Status: Abnormal   Collection Time: 12/11/15  7:18 AM  Result Value Ref Range   Hgb A1c MFr Bld 6.6 (H) 4.0 - 6.0 %  Lipid panel     Status: Abnormal   Collection Time: 12/11/15  7:18 AM  Result Value Ref Range   Cholesterol 151 0 - 200 mg/dL   Triglycerides 213 (H) <150 mg/dL   HDL 20 (L) >40 mg/dL   Total CHOL/HDL Ratio 7.6 RATIO   VLDL 43 (H) 0 - 40 mg/dL   LDL Cholesterol 88 0 - 99 mg/dL    Comment:        Total  Cholesterol/HDL:CHD Risk Coronary  Heart Disease Risk Table                     Men   Women  1/2 Average Risk   3.4   3.3  Average Risk       5.0   4.4  2 X Average Risk   9.6   7.1  3 X Average Risk  23.4   11.0        Use the calculated Patient Ratio above and the CHD Risk Table to determine the patient's CHD Risk.        ATP III CLASSIFICATION (LDL):  <100     mg/dL   Optimal  100-129  mg/dL   Near or Above                    Optimal  130-159  mg/dL   Borderline  160-189  mg/dL   High  >190     mg/dL   Very High   Prolactin     Status: Abnormal   Collection Time: 12/11/15  7:18 AM  Result Value Ref Range   Prolactin 70.0 (H) 4.0 - 15.2 ng/mL    Comment: (NOTE) Performed At: [facility] [place], [place] [id number] Lindon Romp MD [id number]   TSH     Status: None   Collection Time: 12/11/15  7:18 AM  Result Value Ref Range   TSH 1.572 0.350 - 4.500 uIU/mL  Glucose, capillary     Status: Abnormal   Collection Time: 12/11/15 11:41 AM  Result Value Ref Range   Glucose-Capillary 256 (H) 65 - 99 mg/dL   Comment 1 Document in Chart   Glucose, capillary     Status: Abnormal   Collection Time: 12/11/15  4:25 PM  Result Value Ref Range   Glucose-Capillary 209 (H) 65 - 99 mg/dL  Glucose, capillary     Status: Abnormal   Collection Time: 12/11/15  9:13 PM  Result Value Ref Range   Glucose-Capillary 175 (H) 65 - 99 mg/dL  Glucose, capillary     Status: Abnormal   Collection Time: 12/12/15  7:04 AM  Result Value Ref Range   Glucose-Capillary 153 (H) 65 - 99 mg/dL    Blood Alcohol level:  Lab Results  Component Value Date   ETH <5 12/09/2015   ETH <5 09/12/2015    Physical Findings: AIMS: Facial and Oral Movements Muscles of Facial Expression: None, normal Lips and Perioral Area: None, normal Jaw: None, normal Tongue: None, normal,Extremity Movements Upper (arms, wrists, hands, fingers): None, normal Lower (legs, knees, ankles, toes): None, normal, Trunk  Movements Neck, shoulders, hips: None, normal, Overall Severity Severity of abnormal movements (highest score from questions above): None, normal Incapacitation due to abnormal movements: None, normal Patient's awareness of abnormal movements (rate only patient's report): No Awareness, Dental Status Current problems with teeth and/or dentures?: No Does patient usually wear dentures?: No  CIWA:  CIWA-Ar Total: 1 COWS:  COWS Total Score: 1  Musculoskeletal: Strength & Muscle Tone: within normal limits Gait & Station: normal Patient leans: N/A  Psychiatric Specialty Exam: Review of Systems  Constitutional: Negative.   Eyes: Negative.   Respiratory: Negative.   Cardiovascular: Negative.   Gastrointestinal: Negative.   Genitourinary: Negative.   Musculoskeletal: Negative.   Skin: Negative.   Neurological: Negative for headaches.  Endo/Heme/Allergies: Negative.   Psychiatric/Behavioral: Negative.     Blood pressure 101/69, pulse 67, temperature 98 F (36.7  C), temperature source Oral, resp. rate 18, height 5\' 11"  (1.803 m), weight 102.513 kg (226 lb), SpO2 96 %.Body mass index is 31.53 kg/(m^2).  General Appearance: Disheveled  Eye Contact::  Minimal  Speech:  Normal Rate  Volume:  Normal  Mood:  Euthymic  Affect:  Appropriate and Congruent  Thought Process:  Linear  Orientation:  Full (Time, Place, and Person)  Thought Content:  Delusions none  Suicidal Thoughts:  No  Homicidal Thoughts:  No  Memory:  Immediate;   Fair Recent;   Fair Remote;   Fair  Judgement:  Poor  Insight:  Shallow  Psychomotor Activity:  Normal  Concentration:  Fair  Recall:  [place]: Fair  Akathisia:  No  Handed:    AIMS (if indicated):     Assets:  Agricultural consultant Housing  ADL's:  Intact  Cognition: WNL  Sleep:  Number of Hours: 7.3   Treatment Plan Summary:  Schizoaffective disorder: Continue Invega Sustenna 234 mg  every 28 days. [facility] will contact us to let us know when the patient will be due for his next injection. They believe patient has been compliant with injectables.  Navane 10 mg has been added qhs.Patient is also on Lexapro 20 mg a day.   Yesterday  the patient reported resolution of his "paranoia". Staff and myself suspected that the patient's goal  for this admission was to be prescribed benzodiazepine and opiates  EPS: Continue amantadine 100 mg by mouth twice a day  Hyperprolactinemia: continue amantadine 100 mg po bid  Insomnia and anxiety: I will order Vistaril 50 mg every 6 hours as needed. Continue Elavil 25 mg at bedtime  Diabetes type 2: Continue Lantus 30 units daily at bedtime. I have ordered supplemental scale insulin.  Hypertension: continue  lisinopril,  Toprol 25 mg Procardia 90 mg daily  Cardiovascular health: Daily aspirin 81 mg a day  Dyslipidemia continue Lipitor 40 mg daily  Edema: Continue torsemide 20 mg twice a day  Vitamin D deficiency continue vitamin D 50,000 units   Constipation: continue  MiraLAX daily along with Amitiza daily  Chronic back pain: I have ordered Flexeril, Lidoderm patch when necessary I have increased Neurontin to 400 mg 3 times a day  BPH: Continue Proscar   COPD: On dulera and albuterol.  GERD: Continue Protonix  Tobacco use disorder: Continue nicotine patch 21 mg a day.  Diet carb modified and low sodium  Precautions every 15 minute checks  Hospitalization and status continue involuntary commitment  Labs I will order hemoglobin A1c, lipid panel, TSH and prolactin level tomorrow a.m.----TSH is normal, triglycerides are elevated, hemoglobin A1c is pending  Disposition patient is to return to his group home once a stable--- possible discharge back to the group home tomorrow  Follow-up the patient will continue to follow-up with [facility]  Hildred Priest, MD 12/12/2015, 9:36 AM

## 2015-12-12 NOTE — Progress Notes (Signed)
Recreation Therapy Notes  Date: 02.24.17 Time: 3:10 pm Location: Community Room  Group Topic: Coping Skills  Goal Area(s) Addresses:  Patient will participate in a coping skill. Patient will verbalize benefit of using art as a coping skill.  Behavioral Response: Did not attend  Intervention: Coloring  Activity: Patients were given coloring sheets and instructed to color while thinking about the emotions they are feeling and what their mind is focused on.  Education: LRT educated patients on healthy coping skills.  Education Outcome: Patient did not attend group.  Clinical Observations/Feedback: Patient did not attend group.  Leonette Monarch, LRT/CTRS 12/12/2015 4:20 PM

## 2015-12-12 NOTE — BHH Group Notes (Signed)
[facility] Group Notes:  (Nursing/MHT/Case Management/Adjunct)  Date:  12/12/2015  Time:  11:54 AM  Type of Therapy:  Psychoeducational Skills  Participation Level:  Active  Participation Quality:  Appropriate  Affect:  Appropriate  Cognitive:  Appropriate  Insight:  Appropriate  Engagement in Group:  Engaged  Modes of Intervention:  Socialization  Summary of Progress/Problems:  [name] 12/12/2015, 11:54 AM

## 2015-12-12 NOTE — BHH Group Notes (Signed)
[facility] LCSW Aftercare Discharge Planning Group Note   12/12/2015 3:35 PM  Participation Quality:  Patient was called to group but did not attend.  Keene Breath, MSW, LCSWA

## 2015-12-13 DIAGNOSIS — F251 Schizoaffective disorder, depressive type: Principal | ICD-10-CM

## 2015-12-13 LAB — GLUCOSE, CAPILLARY
GLUCOSE-CAPILLARY: 94 mg/dL (ref 65–99)
GLUCOSE-CAPILLARY: 112 mg/dL — AB (ref 65–99)
Glucose-Capillary: 173 mg/dL — ABNORMAL HIGH (ref 65–99)
Glucose-Capillary: 185 mg/dL — ABNORMAL HIGH (ref 65–99)

## 2015-12-13 NOTE — Progress Notes (Signed)
D: Denies SI/HI. Affect flat but brightens with interaction. Cooperative and calm. Appropriate with staff and peers. Denies pain. A: Q15 minute checks maintained for safety. Medications given as prescribed. Encouragement and support provided. R: Pt. Remains safe on unit. Receptive to interventions. Voices no additional concerns. Will continue to monitor.

## 2015-12-13 NOTE — Progress Notes (Signed)
Patient alert and oriented x 4, no acute distress noted, affect is flat but brightens on approach, thoughts are organized, no bizarre behavior noted. Patient denies SI/HI, interacting appropriately with peers and staff, and med compliant, 15 minutes checks maintained will continue to monitor.

## 2015-12-13 NOTE — Plan of Care (Signed)
Problem: Alteration in thought process Goal: LTG-Patient behavior demonstrates decreased signs psychosis (Patient behavior demonstrates decreased signs of psychosis to the point the patient is safe to return home and continue treatment in an outpatient setting.)  Outcome: Progressing Decreased psychosis noted during shift.

## 2015-12-13 NOTE — [facility] Group Notes (Signed)
[facility] LCSW Group Therapy  12/13/2015 4:12 PM  Type of Therapy:  Group Therapy  Participation Level:  Did Not Attend  Modes of Intervention:  Discussion, Education, Socialization and Support  Summary of Progress/Problems: Pt will identify unhealthy thoughts and how they impact their emotions and behavior. Pt will be encouraged to discuss these thoughts, emotions and behaviors with the group.   [place] MSW, LCSWA  12/13/2015, 4:12 PM

## 2015-12-13 NOTE — [facility] Suicide Risk Assessment (Addendum)
[facility] Discharge Suicide Risk Assessment   Principal Problem: Schizoaffective disorder, depressive type [facility]) Discharge Diagnoses:  Patient Active Problem List   Diagnosis Date Noted  . Alcohol use disorder ([place]) [F10.99] 12/10/2015  . Benzodiazepine abuse [F13.10] 12/10/2015  . Opioid abuse [F11.10] 12/10/2015  . Constipation [K59.00] 02/06/2015  . BPH (benign prostatic hyperplasia) [N40.0] 01/16/2015  . Vitamin D deficiency [E55.9] 01/16/2015  . Allergic rhinitis [J30.9] 11/19/2014  . Chronic low back pain [M54.5, G89.29] 11/07/2014  . Onychomycosis of toenail [B35.1] 11/07/2014  . Tinea pedis [B35.3] 11/07/2014  . Psoriasis [L40.9] 10/14/2014  . HTN (hypertension) [I10] 10/14/2014  . Diabetes mellitus type 2, controlled ([place]) [E11.9] 10/14/2014  . COPD (chronic obstructive pulmonary disease) ([place]) [J44.9] 10/14/2014  . Tobacco abuse [Z72.0] 10/14/2014  . HLD (hyperlipidemia) [E78.5] 10/14/2014  . Schizoaffective disorder, depressive type ([place]) [F25.1] 10/14/2014    Total Time spent with patient: 30 minutes  Musculoskeletal: Strength & Muscle Tone: within normal limits Gait & Station: normal Patient leans: N/A  Psychiatric Specialty Exam: Review of Systems  All other systems reviewed and are negative.   Blood pressure 117/55, pulse 62, temperature 98.6 F (37 C), temperature source Oral, resp. rate 20, height 5\' 11"  (1.803 m), weight 102.513 kg (226 lb), SpO2 96 %.Body mass index is 31.53 kg/(m^2).  General Appearance: Casual  Eye Contact::  Good  Speech:  Clear and [id number]  Volume:  Normal  Mood:  Euthymic  Affect:  Appropriate  Thought Process:  Goal Directed  Orientation:  Full (Time, Place, and Person)  Thought Content:  WDL  Suicidal Thoughts:  No  Homicidal Thoughts:  No  Memory:  Immediate;   Fair Recent;   Fair Remote;   Fair  Judgement:  Impaired  Insight:  Present  Psychomotor Activity:  Normal  Concentration:  Fair  Recall:  [employer]  [place]  Language: Fair  Akathisia:  No  Handed:  Right  AIMS (if indicated):     Assets:  Communication Skills Desire for Improvement Financial Resources/Insurance Housing Physical Health Resilience Social Support  Sleep:  Number of Hours: 5.75  Cognition: WNL  ADL's:  Intact   Mental Status Per Nursing Assessment::   On Admission:  NA  Demographic Factors:  Caucasian  Loss Factors: NA  Historical Factors: Prior suicide attempts, Family history of mental illness or substance abuse and Impulsivity  Risk Reduction Factors:   Sense of responsibility to family, Living with another person, especially a relative and Positive social support  Continued Clinical Symptoms:  Bipolar Disorder:   Depressive phase Schizophrenia:   Depressive state  Cognitive Features That Contribute To Risk:  None    Suicide Risk:  Minimal: No identifiable suicidal ideation.  Patients presenting with no risk factors but with morbid ruminations; may be classified as minimal risk based on the severity of the depressive symptoms  Follow-up Information    Follow up with Minerva Ends, MD. Schedule an appointment as soon as possible for a visit on 12/22/2015.   Specialty:  Family Medicine   Why:  Monday March 6 F/u at 10: [address] information:   [place] [address] [phone]       Plan Of Care/Follow-up recommendations:  Activity:  As tolerated. Diet:  Low sodium heart healthy. Other:  Keep follow-up appointments.  Orson Slick, MD 12/13/2015, 2:50 PM

## 2015-12-13 NOTE — Progress Notes (Signed)
[facility] MD Progress Note  12/13/2015 6:39 PM [name]  MRN:  [id number]  Subjective:  [name] has no complaints and is ready for disharge. We have not been able to contact his group home since yesterday. SW left messages. We will discharge tomorrow in a taxi as the group home is aware of his discharge. No somatic complaints. Accepts and tolerates medications well. Good group participation.  Principal Problem: Schizoaffective disorder, depressive type ([place]) Diagnosis:   Patient Active Problem List   Diagnosis Date Noted  . Alcohol use disorder ([place]) [F10.99] 12/10/2015  . Benzodiazepine abuse [F13.10] 12/10/2015  . Opioid abuse [F11.10] 12/10/2015  . Constipation [K59.00] 02/06/2015  . BPH (benign prostatic hyperplasia) [N40.0] 01/16/2015  . Vitamin D deficiency [E55.9] 01/16/2015  . Allergic rhinitis [J30.9] 11/19/2014  . Chronic low back pain [M54.5, G89.29] 11/07/2014  . Onychomycosis of toenail [B35.1] 11/07/2014  . Tinea pedis [B35.3] 11/07/2014  . Psoriasis [L40.9] 10/14/2014  . HTN (hypertension) [I10] 10/14/2014  . Diabetes mellitus type 2, controlled ([place]) [E11.9] 10/14/2014  . COPD (chronic obstructive pulmonary disease) ([place]) [J44.9] 10/14/2014  . Tobacco abuse [Z72.0] 10/14/2014  . HLD (hyperlipidemia) [E78.5] 10/14/2014  . Schizoaffective disorder, depressive type ([place]) [F25.1] 10/14/2014   Total Time spent with patient: 20 minutes  Past Psychiatric History: schizoaffective disorder.  Past Medical History:  Past Medical History  Diagnosis Date  . Diabetes mellitus without complication ([place]) [id number]  . Hypertension 2008  . Schizoaffective disorder ([place]) 2006   . COPD (chronic obstructive pulmonary disease) ([place])   . Alcohol dependence ([place]) 1979    stated abusing ETOH at age 44   . Benzodiazepine dependence ([place])   . Cocaine abuse   . MI (myocardial infarction) ([place])   . Cardiac arrest [facility])     Past Surgical History  Procedure Laterality Date  . Cyst  excision     Family History:  Family History  Problem Relation Age of Onset  . Heart disease Mother   . Heart disease Father   . Heart disease Maternal Grandmother   . Diabetes Maternal Grandmother    Family Psychiatric  History: see H&P. Social History:  History  Alcohol Use No     History  Drug Use No    Social History   Social History  . Marital Status: Divorced    Spouse Name: N/A  . Number of Children: N/A  . Years of Education: N/A   Social History Main Topics  . Smoking status: Current Every Day Smoker -- 1.50 packs/day    Types: Cigarettes  . Smokeless tobacco: None  . Alcohol Use: No  . Drug Use: No  . Sexual Activity: No   Other Topics Concern  . None   Social History Narrative   Additional Social History:                         Sleep: Fair  Appetite:  Fair  Current Medications: Current Facility-Administered Medications  Medication Dose Route Frequency Provider Last Rate Last Dose  . acetaminophen (TYLENOL) tablet 1,000 mg  1,000 mg Oral Q6H PRN Hildred Priest, MD   1,000 mg at 12/13/15 1504  . albuterol (PROVENTIL HFA;VENTOLIN HFA) 108 (90 Base) MCG/ACT inhaler 2 puff  2 puff Inhalation Q6H PRN Hildred Priest, MD      . amantadine (SYMMETREL) capsule 100 mg  100 mg Oral BID Hildred Priest, MD   100 mg at 12/13/15 0830  . amitriptyline (ELAVIL) tablet 25 mg  25 mg Oral QHS Hildred Priest, MD   25 mg at 12/12/15 2108  . aspirin EC tablet 81 mg  81 mg Oral Daily Hildred Priest, MD   81 mg at 12/13/15 0830  . atorvastatin (LIPITOR) tablet 40 mg  40 mg Oral Daily Hildred Priest, MD   40 mg at 12/13/15 0837  . cyclobenzaprine (FLEXERIL) tablet 10 mg  10 mg Oral TID Hildred Priest, MD   10 mg at 12/13/15 1504  . escitalopram (LEXAPRO) tablet 20 mg  20 mg Oral Daily Hildred Priest, MD   20 mg at 12/13/15 [id number]  . finasteride (PROSCAR) tablet 5 mg  5 mg Oral  Daily Hildred Priest, MD   5 mg at 12/13/15 [id number]  . fluticasone (FLONASE) 50 MCG/ACT nasal spray 2 spray  2 spray Each Nare Daily Hildred Priest, MD   2 spray at 12/12/15 [id number]  . gabapentin (NEURONTIN) capsule 400 mg  400 mg Oral TID Hildred Priest, MD   400 mg at 12/13/15 1504  . hydrOXYzine (ATARAX/VISTARIL) tablet 50 mg  50 mg Oral Q6H PRN Hildred Priest, MD   50 mg at 12/13/15 0134  . insulin aspart (novoLOG) injection 0-15 Units  0-15 Units Subcutaneous TID WC Hildred Priest, MD   3 Units at 12/13/15 1211  . insulin aspart (novoLOG) injection 0-5 Units  0-5 Units Subcutaneous QHS Hildred Priest, MD   0 Units at 12/11/15 2154  . insulin glargine (LANTUS) injection 30 Units  30 Units Subcutaneous Q2200 Hildred Priest, MD   30 Units at 12/12/15 2104  . isosorbide mononitrate (IMDUR) 24 hr tablet 30 mg  30 mg Oral Daily Hildred Priest, MD   30 mg at 12/13/15 0829  . lidocaine (LIDODERM) 5 % 1 patch  1 patch Transdermal Q24H Hildred Priest, MD   1 patch at 12/13/15 1229  . lisinopril (PRINIVIL,ZESTRIL) tablet 10 mg  10 mg Oral Daily Hildred Priest, MD   10 mg at 12/13/15 0831  . lubiprostone (AMITIZA) capsule 24 mcg  24 mcg Oral Daily Hildred Priest, MD   24 mcg at 12/13/15 0830  . magnesium hydroxide (MILK OF MAGNESIA) suspension 30 mL  30 mL Oral Daily PRN Hildred Priest, MD      . metoprolol tartrate (LOPRESSOR) tablet 25 mg  25 mg Oral BID Hildred Priest, MD   25 mg at 12/13/15 [id number]  . mometasone-formoterol (DULERA) 100-5 MCG/ACT inhaler 2 puff  2 puff Inhalation BID Hildred Priest, MD   2 puff at 12/12/15 2100  . nicotine (NICODERM CQ - dosed in mg/24 hours) patch 21 mg  21 mg Transdermal Daily Hildred Priest, MD   21 mg at 12/13/15 0827  . NIFEdipine (PROCARDIA-XL/ADALAT CC) 24 hr tablet 90 mg  90 mg Oral Daily Hildred Priest, MD   90 mg at 12/13/15 [id number]  . pantoprazole (PROTONIX) EC tablet 40 mg  40 mg Oral Daily Hildred Priest, MD   40 mg at 12/13/15 [id number]  . polyethylene glycol (MIRALAX / GLYCOLAX) packet 17 g  17 g Oral Daily Hildred Priest, MD   17 g at 12/13/15 [id number]  . senna-docusate (Senokot-S) tablet 3 tablet  3 tablet Oral Once Hildred Priest, MD   3 tablet at 12/11/15 1841  . thiothixene (NAVANE) capsule 10 mg  10 mg Oral QHS Hildred Priest, MD   10 mg at 12/12/15 2108  . torsemide (DEMADEX) tablet 20 mg  20 mg Oral BID Hildred Priest, MD   20 mg at 12/13/15 1656  . triamcinolone cream (  KENALOG) 0.5 %   Topical BID Hildred Priest, MD      . Vitamin D (Ergocalciferol) (DRISDOL) capsule 50,000 Units  50,000 Units Oral Q7 days Hildred Priest, MD   50,000 Units at 12/10/15 1025    Lab Results:  Results for orders placed or performed during the hospital encounter of 12/10/15 (from the past 48 hour(s))  Glucose, capillary     Status: Abnormal   Collection Time: 12/11/15  9:13 PM  Result Value Ref Range   Glucose-Capillary 175 (H) 65 - 99 mg/dL  Glucose, capillary     Status: Abnormal   Collection Time: 12/12/15  7:04 AM  Result Value Ref Range   Glucose-Capillary 153 (H) 65 - 99 mg/dL  Glucose, capillary     Status: Abnormal   Collection Time: 12/12/15 11:48 AM  Result Value Ref Range   Glucose-Capillary 212 (H) 65 - 99 mg/dL  Glucose, capillary     Status: Abnormal   Collection Time: 12/12/15  4:46 PM  Result Value Ref Range   Glucose-Capillary 100 (H) 65 - 99 mg/dL  Glucose, capillary     Status: Abnormal   Collection Time: 12/12/15  8:31 PM  Result Value Ref Range   Glucose-Capillary 164 (H) 65 - 99 mg/dL  Glucose, capillary     Status: Abnormal   Collection Time: 12/13/15  7:12 AM  Result Value Ref Range   Glucose-Capillary 173 (H) 65 - 99 mg/dL  Glucose, capillary     Status: Abnormal   Collection  Time: 12/13/15 11:57 AM  Result Value Ref Range   Glucose-Capillary 185 (H) 65 - 99 mg/dL   Comment 1 Notify RN   Glucose, capillary     Status: None   Collection Time: 12/13/15  4:33 PM  Result Value Ref Range   Glucose-Capillary 94 65 - 99 mg/dL   Comment 1 Notify RN     Blood Alcohol level:  Lab Results  Component Value Date   ETH <5 12/09/2015   ETH <5 09/12/2015    Physical Findings: AIMS: Facial and Oral Movements Muscles of Facial Expression: None, normal Lips and Perioral Area: None, normal Jaw: None, normal Tongue: None, normal,Extremity Movements Upper (arms, wrists, hands, fingers): None, normal Lower (legs, knees, ankles, toes): None, normal, Trunk Movements Neck, shoulders, hips: None, normal, Overall Severity Severity of abnormal movements (highest score from questions above): None, normal Incapacitation due to abnormal movements: None, normal Patient's awareness of abnormal movements (rate only patient's report): No Awareness, Dental Status Current problems with teeth and/or dentures?: No Does patient usually wear dentures?: No  CIWA:  CIWA-Ar Total: 1 COWS:  COWS Total Score: 1  Musculoskeletal: Strength & Muscle Tone: within normal limits Gait & Station: normal Patient leans: N/A  Psychiatric Specialty Exam: Review of Systems  All other systems reviewed and are negative.   Blood pressure 117/55, pulse 62, temperature 98.6 F (37 C), temperature source Oral, resp. rate 20, height 5\' 11"  (1.803 m), weight 102.513 kg (226 lb), SpO2 96 %.Body mass index is 31.53 kg/(m^2).  General Appearance: Casual  Eye Contact::  Good  Speech:  Clear and Coherent  Volume:  Normal  Mood:  Euthymic  Affect:  Appropriate  Thought Process:  Goal Directed  Orientation:  Full (Time, Place, and Person)  Thought Content:  WDL  Suicidal Thoughts:  No  Homicidal Thoughts:  No  Memory:  Immediate;   Fair Recent;   Fair Remote;   Fair  Judgement:  Poor  Insight:   Shallow  Psychomotor Activity:  Normal  Concentration:  Fair  Recall:  [place]  Language: Fair  Akathisia:  No  Handed:  Right  AIMS (if indicated):     Assets:  Communication Skills Desire for Improvement Financial Resources/Insurance Housing Resilience Social Support  ADL's:  Intact  Cognition: WNL  Sleep:  Number of Hours: 5.75   Treatment Plan Summary: Daily contact with patient to assess and evaluate symptoms and progress in treatment and Medication management   [name] is a 55 year old male with a history of schizoaffective disorder admitted for agitated psychotic behavior at the group home.  1. Schizoaffective disorder: Continue Invega Sustenna 234 mg every 28 days. [facility] will contact us to let us know when the patient will be due for his next injection. They believe patient has been compliant with injectables. Navane 10 mg has been added qhs.Patient is also on Lexapro 20 mg a day. Shortly, the patient reported resolution of his "paranoia". Staff and myself suspected that the patient's goal for this admission was to be prescribed benzodiazepine and opiates.  2. EPS: Continue amantadine 100 mg by mouth twice a day  3. Hyperprolactinemia: continue amantadine 100 mg po bid. PRL 70.  4. Insomnia and anxiety: I will order Vistaril 50 mg every 6 hours as needed. Continue Elavil 25 mg at bedtime  5. Diabetes type 2: Continue Lantus 30 units daily at bedtime. Hemoglobin A1c 6.6.  6. Hypertension: continue lisinopril, Toprol 25 mg Procardia 90 mg daily  7. Cardiovascular health: Daily aspirin 81 mg a day  8. Dyslipidemia continue Lipitor 40 mg daily. Triglycerides are elevated.  9. Edema: Continue torsemide 20 mg twice a day  10. Vitamin D deficiency continue vitamin D 50,000 units   11. Constipation: continue MiraLAX daily along with Amitiza daily  12. Chronic back pain: I have ordered Flexeril, Lidoderm patch when necessary I have increased  Neurontin to 400 mg 3 times a day  13. BPH: Continue Proscar   14. COPD: On dulera and albuterol.  15. GERD: Continue Protonix  16. Tobacco use disorder: Continue nicotine patch 21 mg a day.  17. Disposition. The patient was discharged to his group home. He will continue to follow-up with [facility].   Orson Slick, MD 12/13/2015, 6:39 PM
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
## 2015-12-14 LAB — GLUCOSE, CAPILLARY
Glucose-Capillary: 132 mg/dL — ABNORMAL HIGH (ref 65–99)
Glucose-Capillary: 188 mg/dL — ABNORMAL HIGH (ref 65–99)

## 2015-12-14 NOTE — Progress Notes (Signed)
Pt discharged from [facility]. All instructions reviewed with pt. Pt stated he understood and would comply with same. All pt's belongings returned.Pt denies SI and A/V hallucinations.

## 2015-12-14 NOTE — NC FL2 (Signed)
[place] LEVEL OF CARE SCREENING TOOL     IDENTIFICATION  Patient Name: [name] Birthdate: 1960/11/06 Sex: male Admission Date (Current Location): 12/10/2015  [place] and [place] Number:  Selena Lesser [id number] [place] and Address:  [facility], [address] [place], [place] [address]      Provider Number: [id number]  Attending Physician Name and Address:  Golden Hurter*  Relative Name and Phone Number:       Current Level of Care: Hospital Recommended Level of Care: [place] Prior Approval Number:    Date Approved/Denied:   PASRR Number:    Discharge Plan: Home    Current Diagnoses: Patient Active Problem List   Diagnosis Date Noted  . Alcohol use disorder ([place]) 12/10/2015  . Benzodiazepine abuse 12/10/2015  . Opioid abuse 12/10/2015  . Constipation 02/06/2015  . BPH (benign prostatic hyperplasia) 01/16/2015  . Vitamin D deficiency 01/16/2015  . Allergic rhinitis 11/19/2014  . Chronic low back pain 11/07/2014  . Onychomycosis of toenail 11/07/2014  . Tinea pedis 11/07/2014  . Psoriasis 10/14/2014  . HTN (hypertension) 10/14/2014  . Diabetes mellitus type 2, controlled ([place]) 10/14/2014  . COPD (chronic obstructive pulmonary disease) ([place]) 10/14/2014  . Tobacco abuse 10/14/2014  . HLD (hyperlipidemia) 10/14/2014  . Schizoaffective disorder, depressive type ([place]) 10/14/2014    Orientation RESPIRATION BLADDER Height & Weight     Time, Self, Situation, Place  Normal Continent Weight: 226 lb (102.513 kg) Height:  5\' 11"  (180.3 cm)  BEHAVIORAL SYMPTOMS/MOOD NEUROLOGICAL BOWEL NUTRITION STATUS      Continent    AMBULATORY STATUS COMMUNICATION OF NEEDS Skin   Independent Verbally Normal                       Personal Care Assistance Level of Assistance  Bathing, Feeding, Dressing, Total care Bathing Assistance: Independent Feeding assistance: Independent Dressing Assistance:  Independent Total Care Assistance: Independent   Functional Limitations Info  Sight, Hearing, Speech Sight Info: Adequate Hearing Info: Adequate Speech Info: Adequate    SPECIAL CARE FACTORS FREQUENCY                       Contractures Contractures Info: Not present    Additional Factors Info  Psychotropic               Current Medications (12/14/2015):  This is the current hospital active medication list Current Facility-Administered Medications  Medication Dose Route Frequency Provider Last Rate Last Dose  . acetaminophen (TYLENOL) tablet 1,000 mg  1,000 mg Oral Q6H PRN Hildred Priest, MD   1,000 mg at 12/13/15 1504  . albuterol (PROVENTIL HFA;VENTOLIN HFA) 108 (90 Base) MCG/ACT inhaler 2 puff  2 puff Inhalation Q6H PRN Hildred Priest, MD      . amantadine (SYMMETREL) capsule 100 mg  100 mg Oral BID Hildred Priest, MD   100 mg at 12/14/15 [id number]  . amitriptyline (ELAVIL) tablet 25 mg  25 mg Oral QHS Hildred Priest, MD   25 mg at 12/12/15 2108  . aspirin EC tablet 81 mg  81 mg Oral Daily Hildred Priest, MD   81 mg at 12/14/15 0831  . atorvastatin (LIPITOR) tablet 40 mg  40 mg Oral Daily Hildred Priest, MD   40 mg at 12/14/15 [id number]  . cyclobenzaprine (FLEXERIL) tablet 10 mg  10 mg Oral TID Hildred Priest, MD   10 mg at 12/14/15 0835  . escitalopram (LEXAPRO) tablet 20 mg  20  mg Oral Daily Hildred Priest, MD   20 mg at 12/14/15 [phone]  . finasteride (PROSCAR) tablet 5 mg  5 mg Oral Daily Hildred Priest, MD   5 mg at 12/14/15 0830  . fluticasone (FLONASE) 50 MCG/ACT nasal spray 2 spray  2 spray Each Nare Daily Hildred Priest, MD   2 spray at 12/12/15 [phone]  . gabapentin (NEURONTIN) capsule 400 mg  400 mg Oral TID Hildred Priest, MD   400 mg at 12/14/15 0834  . hydrOXYzine (ATARAX/VISTARIL) tablet 50 mg  50 mg Oral Q6H PRN Hildred Priest, MD   50 mg at  12/13/15 0134  . insulin aspart (novoLOG) injection 0-15 Units  0-15 Units Subcutaneous TID WC Hildred Priest, MD   2 Units at 12/14/15 [phone]  . insulin aspart (novoLOG) injection 0-5 Units  0-5 Units Subcutaneous QHS Hildred Priest, MD   0 Units at 12/11/15 2154  . insulin glargine (LANTUS) injection 30 Units  30 Units Subcutaneous Q2200 Hildred Priest, MD   30 Units at 12/13/15 2204  . isosorbide mononitrate (IMDUR) 24 hr tablet 30 mg  30 mg Oral Daily Hildred Priest, MD   30 mg at 12/14/15 0834  . lidocaine (LIDODERM) 5 % 1 patch  1 patch Transdermal Q24H Hildred Priest, MD   1 patch at 12/13/15 1229  . lisinopril (PRINIVIL,ZESTRIL) tablet 10 mg  10 mg Oral Daily Hildred Priest, MD   10 mg at 12/14/15 0834  . lubiprostone (AMITIZA) capsule 24 mcg  24 mcg Oral Daily Hildred Priest, MD   24 mcg at 12/14/15 [phone]  . magnesium hydroxide (MILK OF MAGNESIA) suspension 30 mL  30 mL Oral Daily PRN Hildred Priest, MD      . metoprolol tartrate (LOPRESSOR) tablet 25 mg  25 mg Oral BID Hildred Priest, MD   25 mg at 12/14/15 0835  . mometasone-formoterol (DULERA) 100-5 MCG/ACT inhaler 2 puff  2 puff Inhalation BID Hildred Priest, MD   2 puff at 12/13/15 2000  . nicotine (NICODERM CQ - dosed in mg/24 hours) patch 21 mg  21 mg Transdermal Daily Hildred Priest, MD   21 mg at 12/14/15 0947  . NIFEdipine (PROCARDIA-XL/ADALAT CC) 24 hr tablet 90 mg  90 mg Oral Daily Hildred Priest, MD   90 mg at 12/14/15 0831  . pantoprazole (PROTONIX) EC tablet 40 mg  40 mg Oral Daily Hildred Priest, MD   40 mg at 12/14/15 [id number]  . polyethylene glycol (MIRALAX / GLYCOLAX) packet 17 g  17 g Oral Daily Hildred Priest, MD   17 g at 12/13/15 [id number]  . senna-docusate (Senokot-S) tablet 3 tablet  3 tablet Oral Once Hildred Priest, MD   3 tablet at 12/11/15 1841  . thiothixene  (NAVANE) capsule 10 mg  10 mg Oral QHS Hildred Priest, MD   10 mg at 12/13/15 2044  . torsemide (DEMADEX) tablet 20 mg  20 mg Oral BID Hildred Priest, MD   20 mg at 12/14/15 [id number]  . triamcinolone cream (KENALOG) 0.5 %   Topical BID Hildred Priest, MD      . Vitamin D (Ergocalciferol) (DRISDOL) capsule 50,000 Units  50,000 Units Oral Q7 days Hildred Priest, MD   50,000 Units at 12/10/15 1025     Discharge Medications: Please see discharge summary for a list of discharge medications.  Relevant Imaging Results:  Relevant Lab Results:   Additional Information  SSN: [id number]  [place], LCSWA

## 2015-12-14 NOTE — [facility] Group Notes (Signed)
[facility] Group Notes:  (Nursing/MHT/Case Management/Adjunct)  Date:  12/14/2015  Time:  11:48 AM  Type of Therapy:  Psychoeducational Skills  Participation Level:  Did Not Attend   Adela Lank [facility] 12/14/2015, 11:48 AM

## 2015-12-14 NOTE — Tx Team (Addendum)
Interdisciplinary Treatment Plan Update (Adult)  Date:  12/14/2015 Time Reviewed:  9:34 AM  Progress in Treatment: Attending groups: No. Participating in groups:  No. Taking medication as prescribed:  Yes. Tolerating medication:  Yes. Family/Significant othe contact made:  Yes, individual(s) contacted:  group home and ACTT  Patient understands diagnosis:  No. and As evidenced by:  Limited insight  Discussing patient identified problems/goals with staff:  Yes. Medical problems stabilized or resolved:  Yes. Denies suicidal/homicidal ideation: Yes. Issues/concerns per patient self-inventory:  Yes. Other:  New problem(s) identified: No, Describe:  NA  Discharge Plan or Barriers: Pt plans to return home and follow up with outpatient.    Reason for Continuation of Hospitalization: Aggression Delusions  Medication stabilization  Comments: The patient has been compliant with medications. He has not displayed disruptive behaviors. The patient is tolerating medications well and denies side effects. As far as physical complaints the patient appears to be med seeking he keeps up with me on the hallways telling me that he is having withdrawal from benzodiazepines, that he feels his throat is closing in, and that he is having severe back pain. If the patient is reporting these complaints he is seen walking around the unit with not difficulties he does not appear to be in severe distress. His vital signs are within the normal limits. There is not evidence of paranoia or delusional thinking at this time. We will consider discharging him back to his group home tomorrow.  Estimated length of stay: Pt will likely d/c today.   New goal(s): NA  Review of initial/current patient goals per problem list:   1.  Goal(s): Patient will participate in aftercare plan * Met: yes * Target date: at discharge * As evidenced by: Patient will participate within aftercare plan AEB aftercare provider and housing plan  at discharge being identified.   2.  Goal(s): Patient will demonstrate decreased signs of withdrawal due to substance abuse * Met: yes * Target date: at discharge * As evidenced by: Patient will produce a CIWA/COWS score of 0, have stable vitals signs, and no symptoms of withdrawal.  3.  Goal (s): Patient will demonstrate decreased symptoms of psychosis. * Met: yes  *  Target date: at discharge * As evidenced by: Patient will not endorse signs of psychosis or be deemed stable for discharge by MD.   Attendees: Patient:  [name] 2/26/20179:34 AM  Family:   2/26/20179:34 AM  Physician:   Dr. Bary Leriche  2/26/20179:34 AM  Nursing:   Bertram Denver, RN  2/26/20179:34 AM  Case Manager:   2/26/20179:34 AM  Counselor:   2/26/20179:34 AM  Other:  [place] 2/26/20179:34 AM  Other:   2/26/20179:34 AM  Other:   2/26/20179:34 AM  Other:  2/26/20179:34 AM  Other:  2/26/20179:34 AM  Other:  2/26/20179:34 AM  Other:  2/26/20179:34 AM  Other:  2/26/20179:34 AM  Other:  2/26/20179:34 AM  Other:   2/26/20179:34 AM   Scribe for Treatment Team:   Wray Kearns, MSW, [place]  12/14/2015, 9:34 AM

## 2015-12-14 NOTE — Progress Notes (Signed)
  [name] Adult Case Management Discharge Plan :  Will you be returning to the same living situation after discharge:  Yes,  group home  At discharge, do you have transportation home?: Yes,  group home staff Do you have the ability to pay for your medications: Yes,  insurance, income ]  Release of information consent forms completed and in the chart;  Patient's signature needed at discharge.  Patient to Follow up at: Follow-up Information    Follow up with Minerva Ends, MD. Schedule an appointment as soon as possible for a visit on 12/22/2015.   Specialty:  Family Medicine   Why:  Monday March 6 F/u at 10: 00am   Contact information:   [place] [address] [phone]       Follow up with [facility]  On 12/15/2015.   Why:  Your ACT team will visit you on Monday Feb. 27th.    Contact information:   [address] [place], [place] [address] Phone: [phone] Fax: [phone]      Next level of care provider has access to [place] and Suicide Prevention discussed: Yes,  with patient and group home staff  Have you used any form of tobacco in the last 30 days? (Cigarettes, Smokeless Tobacco, Cigars, and/or Pipes): Yes  Has patient been referred to the Quitline?: Patient refused referral  Patient has been referred for addiction treatment: Pt. refused referral  Wray Kearns MSW, [place]  12/14/2015, 9:32 AM

## 2015-12-14 NOTE — [facility] Suicide Risk Assessment (Signed)
[place] INPATIENT:  Family/Significant Other Suicide Prevention Education  Suicide Prevention Education:  Education Completed; Catering manager (group home) has been identified by the patient as the family member/significant other with whom the patient will be residing, and identified as the person(s) who will aid the patient in the event of a mental health crisis (suicidal ideations/suicide attempt).  With written consent from the patient, the family member/significant other has been provided the following suicide prevention education, prior to the and/or following the discharge of the patient.  The suicide prevention education provided includes the following:  Suicide risk factors  Suicide prevention and interventions  National Suicide Hotline telephone number  [facility] assessment telephone number  [facility] Emergency Assistance [place] and/or Residential Mobile Crisis Unit telephone number  Request made of family/significant other to:  Remove weapons (e.g., guns, rifles, knives), all items previously/currently identified as safety concern.    Remove drugs/medications (over-the-counter, prescriptions, illicit drugs), all items previously/currently identified as a safety concern.  The family member/significant other verbalizes understanding of the suicide prevention education information provided.  The family member/significant other agrees to remove the items of safety concern listed above.  [place] MSW, [place]  12/14/2015, 9:31 AM

## 2015-12-14 NOTE — Progress Notes (Addendum)
Patient discharged at this time to to private car of group home.  Discharge plan reviewed with patient's nurse. Patient acknowledges to Probation officer that all belongings have been returned. Safety maintained.

## 2015-12-19 ENCOUNTER — Telehealth: Payer: Self-pay | Admitting: Family Medicine

## 2015-12-19 NOTE — Telephone Encounter (Signed)
Patient states he needs a prescription for Klonopin and tylenol #3.patient states he is not feeling well and needs to be put back on these medications..... I informed patient he has an appointment on Monday, he states he is not certain if he will come in.... Please follow up

## 2015-12-19 NOTE — Telephone Encounter (Signed)
Pt. Called requesting a Rx for Tylenol # 3 and Klonopin. Pt. Is not feeling good and needs this medications. Please f/u with pt.

## 2015-12-22 ENCOUNTER — Inpatient Hospital Stay: Payer: Self-pay | Admitting: Family Medicine

## 2015-12-23 NOTE — Telephone Encounter (Signed)
Patient has not been seen in 7 months Multiple DNKA DNKA yesterday.  I am unable to refill meds until f/u appt completed to determine if meds are still appropriate Please reschedule f/u appt

## 2015-12-24 NOTE — Telephone Encounter (Signed)
LVM to return call   Pt needs OV

## 2015-12-30 NOTE — Telephone Encounter (Signed)
Pt. Called requesting a med refill on Tylenol # 3, Klonopin and a muscle relaxer. Pt. Was told that he could not have a refill unless he had a office visit and pt. Stated that he has no way to get here. Please f/u

## 2015-12-31 ENCOUNTER — Telehealth: Payer: Self-pay | Admitting: Family Medicine

## 2015-12-31 NOTE — Telephone Encounter (Signed)
Patient called requesting a medication refill for novolog, lantus and lancets [facility]  [phone] [place], [place] Please follow up.

## 2015-12-31 NOTE — Telephone Encounter (Signed)
Patient called requesting a medication refill for novolog, lantus and lancets [facility] [phone] [place], [place] Please follow up.

## 2016-01-01 ENCOUNTER — Ambulatory Visit: Payer: Self-pay | Admitting: Family Medicine

## 2016-01-01 NOTE — Telephone Encounter (Signed)
Patient called stating they were not feeling well and is needing a refill on insulin. Patient would like prescription to be sent to,   [place], [place] Parents follow up.

## 2016-01-02 ENCOUNTER — Encounter: Payer: Self-pay | Admitting: Family Medicine

## 2016-01-02 ENCOUNTER — Ambulatory Visit: Payer: Medicare Other | Attending: Family Medicine | Admitting: Family Medicine

## 2016-01-02 ENCOUNTER — Telehealth: Payer: Self-pay | Admitting: Family Medicine

## 2016-01-02 VITALS — BP 137/80 | HR 79 | Temp 97.9°F | Resp 16 | Ht 72.0 in | Wt 226.0 lb

## 2016-01-02 DIAGNOSIS — F259 Schizoaffective disorder, unspecified: Secondary | ICD-10-CM | POA: Diagnosis not present

## 2016-01-02 DIAGNOSIS — R739 Hyperglycemia, unspecified: Secondary | ICD-10-CM

## 2016-01-02 DIAGNOSIS — Z79899 Other long term (current) drug therapy: Secondary | ICD-10-CM | POA: Insufficient documentation

## 2016-01-02 DIAGNOSIS — I10 Essential (primary) hypertension: Secondary | ICD-10-CM | POA: Insufficient documentation

## 2016-01-02 DIAGNOSIS — J309 Allergic rhinitis, unspecified: Secondary | ICD-10-CM

## 2016-01-02 DIAGNOSIS — Z794 Long term (current) use of insulin: Secondary | ICD-10-CM | POA: Diagnosis not present

## 2016-01-02 DIAGNOSIS — M545 Low back pain: Secondary | ICD-10-CM | POA: Diagnosis not present

## 2016-01-02 DIAGNOSIS — B351 Tinea unguium: Secondary | ICD-10-CM

## 2016-01-02 DIAGNOSIS — E119 Type 2 diabetes mellitus without complications: Secondary | ICD-10-CM | POA: Insufficient documentation

## 2016-01-02 DIAGNOSIS — Z9119 Patient's noncompliance with other medical treatment and regimen: Secondary | ICD-10-CM | POA: Insufficient documentation

## 2016-01-02 DIAGNOSIS — Z9114 Patient's other noncompliance with medication regimen: Secondary | ICD-10-CM | POA: Diagnosis not present

## 2016-01-02 DIAGNOSIS — G8929 Other chronic pain: Secondary | ICD-10-CM | POA: Diagnosis not present

## 2016-01-02 DIAGNOSIS — F251 Schizoaffective disorder, depressive type: Secondary | ICD-10-CM

## 2016-01-02 DIAGNOSIS — M549 Dorsalgia, unspecified: Secondary | ICD-10-CM | POA: Diagnosis not present

## 2016-01-02 DIAGNOSIS — J449 Chronic obstructive pulmonary disease, unspecified: Secondary | ICD-10-CM | POA: Diagnosis not present

## 2016-01-02 DIAGNOSIS — Z7982 Long term (current) use of aspirin: Secondary | ICD-10-CM | POA: Insufficient documentation

## 2016-01-02 DIAGNOSIS — E785 Hyperlipidemia, unspecified: Secondary | ICD-10-CM

## 2016-01-02 DIAGNOSIS — F1310 Sedative, hypnotic or anxiolytic abuse, uncomplicated: Secondary | ICD-10-CM

## 2016-01-02 LAB — POCT URINALYSIS DIPSTICK
BILIRUBIN UA: NEGATIVE
Blood, UA: NEGATIVE
GLUCOSE UA: 500
Ketones, UA: NEGATIVE
LEUKOCYTES UA: NEGATIVE
NITRITE UA: NEGATIVE
Protein, UA: NEGATIVE
Spec Grav, UA: <=1.005
UROBILINOGEN UA: 0.2
pH, UA: 7

## 2016-01-02 LAB — GLUCOSE, POCT (MANUAL RESULT ENTRY)
POC GLUCOSE: 365 mg/dL — AB (ref 70–99)
POC Glucose: 389 mg/dl — AB (ref 70–99)

## 2016-01-02 MED ORDER — INSULIN GLARGINE 100 UNIT/ML ~~LOC~~ SOLN: 30.0000 [IU] | Freq: Every day | SUBCUTANEOUS | Status: DC

## 2016-01-02 MED ORDER — INSULIN ASPART 100 UNIT/ML ~~LOC~~ SOLN
20.0000 [IU] | Freq: Once | SUBCUTANEOUS | Status: AC
  Administered 2016-01-02: 20 [IU] via SUBCUTANEOUS

## 2016-01-02 MED ORDER — FLUTICASONE PROPIONATE 50 MCG/ACT NA SUSP: NASAL | Status: DC

## 2016-01-02 MED ORDER — ALBUTEROL SULFATE HFA 108 (90 BASE) MCG/ACT IN AERS: 2.0000 | INHALATION_SPRAY | Freq: Four times a day (QID) | RESPIRATORY_TRACT | Status: DC | PRN

## 2016-01-02 MED ORDER — ASPIRIN EC 81 MG PO TBEC: 81.0000 mg | DELAYED_RELEASE_TABLET | Freq: Every day | ORAL | Status: DC

## 2016-01-02 MED ORDER — FLUTICASONE-SALMETEROL 100-50 MCG/DOSE IN AEPB: 1.0000 | INHALATION_SPRAY | Freq: Two times a day (BID) | RESPIRATORY_TRACT | Status: DC

## 2016-01-02 MED ORDER — INSULIN ASPART 100 UNIT/ML ~~LOC~~ SOLN: 15.0000 [IU] | Freq: Three times a day (TID) | SUBCUTANEOUS | Status: DC

## 2016-01-02 MED ORDER — CETIRIZINE HCL 10 MG PO TABS: 10.0000 mg | ORAL_TABLET | Freq: Every day | ORAL | Status: DC

## 2016-01-02 MED ORDER — ISOSORBIDE MONONITRATE ER 30 MG PO TB24: 30.0000 mg | ORAL_TABLET | Freq: Every day | ORAL | Status: DC

## 2016-01-02 MED ORDER — GABAPENTIN 400 MG PO CAPS: 400.0000 mg | ORAL_CAPSULE | Freq: Three times a day (TID) | ORAL | Status: DC

## 2016-01-02 MED ORDER — ATORVASTATIN CALCIUM 40 MG PO TABS: 40.0000 mg | ORAL_TABLET | Freq: Every day | ORAL | Status: DC

## 2016-01-02 MED ORDER — ACETAMINOPHEN 500 MG PO TABS: 500.0000 mg | ORAL_TABLET | Freq: Three times a day (TID) | ORAL | Status: DC | PRN

## 2016-01-02 MED ORDER — LISINOPRIL 10 MG PO TABS: 10.0000 mg | ORAL_TABLET | Freq: Every day | ORAL | Status: DC

## 2016-01-02 NOTE — Progress Notes (Signed)
F/U DM  No medication x 1 week  Stated glucose been running over 500 Tobacco user 1ppday Burning sensation on hands  Pain scale #9  No suicide thoughts in the past two weeks

## 2016-01-02 NOTE — Telephone Encounter (Signed)
RN concerned is that patient abruptly moved from the group home, patient is now living with his mother and sister, patient would be managing his own meds.   She requesting for his Klonopin and pain meds to not be refilled...Marland KitchenMarland KitchenMarland Kitchenpatient has been hospitalized three times for drinking antifreeze...Marland Kitchenhe has a history of abusing his pain meds.  IF you have further questions please contact RN.

## 2016-01-02 NOTE — Patient Instructions (Addendum)
[name] was seen today for diabetes.  Diagnoses and all orders for this visit:  Type 2 diabetes mellitus without complication, with long-term current use of insulin (HCC) -     Glucose (CBG) -     POCT urinalysis dipstick -     atorvastatin (LIPITOR) 40 MG tablet; Take 1 tablet (40 mg total) by mouth daily. Reported on 01/02/2016 -     aspirin EC 81 MG tablet; Take 1 tablet (81 mg total) by mouth daily. Reported on 01/02/2016 -     insulin aspart (NOVOLOG) 100 UNIT/ML injection; Inject 15 Units into the skin 3 (three) times daily before meals. Reported on 01/02/2016 -     insulin glargine (LANTUS) 100 UNIT/ML injection; Inject 0.3 mLs (30 Units total) into the skin daily at 10 pm.  Controlled type 2 diabetes mellitus without complication, with long-term current use of insulin (HCC)  Hyperglycemia -     insulin aspart (novoLOG) injection 20 Units; Inject 0.2 mLs (20 Units total) into the skin once. -     Glucose (CBG)  Chronic low back pain -     acetaminophen (TYLENOL) 500 MG tablet; Take 1 tablet (500 mg total) by mouth every 8 (eight) hours as needed for mild pain. -     gabapentin (NEURONTIN) 400 MG capsule; Take 1 capsule (400 mg total) by mouth 3 (three) times daily.  Chronic obstructive pulmonary disease, unspecified COPD type ([place]) -     Ambulatory referral to Pain Clinic  Chronic obstructive pulmonary disease, unspecified COPD, unspecified chronic bronchitis type -     albuterol (PROVENTIL HFA;VENTOLIN HFA) 108 (90 Base) MCG/ACT inhaler; Inhale 2 puffs into the lungs every 6 (six) hours as needed for wheezing or shortness of breath. -     Fluticasone-Salmeterol (ADVAIR) 100-50 MCG/DOSE AEPB; Inhale 1 puff into the lungs 2 (two) times daily.  Allergic rhinitis, unspecified allergic rhinitis type -     cetirizine (ZYRTEC) 10 MG tablet; Take 1 tablet (10 mg total) by mouth daily. -     fluticasone (FLONASE) 50 MCG/ACT nasal spray; PLACE TWO   SPRAYS INTO BOTH NOSTRILS  DAILY.  Essential hypertension -     isosorbide mononitrate (IMDUR) 30 MG 24 hr tablet; Take 1 tablet (30 mg total) by mouth daily. Reported on 01/02/2016 -     lisinopril (PRINIVIL,ZESTRIL) 10 MG tablet; Take 1 tablet (10 mg total) by mouth daily. Reported on 01/02/2016  Onychomycosis of toenail -     Ambulatory referral to Podiatry    F/u in 6 weeks for diabetes  Dr. Adrian Blackwater

## 2016-01-02 NOTE — Progress Notes (Signed)
Subjective:  Patient ID: [name], male    DOB: 16-Apr-1961  Age: 55 y.o. MRN: [id number]  CC: Diabetes   HPI [name] has multiple medical problems, schizoaffective disorder, diabetes, HTN, chronic back pain. He was living a group home until about 2 weeks ago when he checked himself out to live with his mother. He reports not taking any of he psych medications.    1. CHRONIC DIABETES  Disease Monitoring  Blood Sugar Ranges: not checking   Polyuria: no   Visual problems: no   Medication Compliance: no, none x one week   Medication Side Effects  Hypoglycemia: no   Preventitive Health Care  Eye Exam: due   Foot Exam: done today    2. Chronic pain; he has chronic pain in back. He has hx of substance abuse including alcohol and antifreeze. He has been in preferred pain management in the past but was released due to med non compliance. He request refill of tylenol #3 or regular tylenol.  3. Schizoaffective disorder: he is non compliant with all psych meds. He request klonopin for anxiety. He denies SI.   Outpatient Prescriptions Prior to Visit  Medication Sig Dispense Refill  . acetaminophen (TYLENOL) 500 MG tablet Take 2 tablets (1,000 mg total) by mouth every 6 (six) hours as needed for mild pain. 30 tablet 0  . albuterol (PROVENTIL HFA;VENTOLIN HFA) 108 (90 BASE) MCG/ACT inhaler Inhale 2 puffs into the lungs every 6 (six) hours as needed for wheezing or shortness of breath. 1 Inhaler 5  . amantadine (SYMMETREL) 100 MG capsule Take 1 capsule (100 mg total) by mouth 2 (two) times daily. 60 capsule 0  . amitriptyline (ELAVIL) 25 MG tablet Take 25 mg by mouth at bedtime.    [name] aspirin EC 81 MG tablet Take 81 mg by mouth daily.    [name] atorvastatin (LIPITOR) 40 MG tablet Take 40 mg by mouth daily.    . betamethasone dipropionate (DIPROLENE) 0.05 % ointment Apply 1 application topically 2 (two) times daily.    . cetirizine (ZYRTEC) 10 MG tablet Take 1 tablet (10 mg total) by  mouth daily. 30 tablet 11  . cyclobenzaprine (FLEXERIL) 10 MG tablet Take 1 tablet (10 mg total) by mouth 3 (three) times daily. 90 tablet 0  . escitalopram (LEXAPRO) 20 MG tablet Take 1 tablet (20 mg total) by mouth daily. 90 tablet 1  . finasteride (PROSCAR) 5 MG tablet Take 1 tablet (5 mg total) by mouth daily. 30 tablet 5  . fluticasone (FLONASE) 50 MCG/ACT nasal spray PLACE TWO   SPRAYS INTO BOTH NOSTRILS DAILY. 9.9 g 0  . Fluticasone-Salmeterol (ADVAIR) 100-50 MCG/DOSE AEPB Inhale 1 puff into the lungs 2 (two) times daily. 1 each 5  . gabapentin (NEURONTIN) 400 MG capsule Take 1 capsule (400 mg total) by mouth 3 (three) times daily. 90 capsule 0  . insulin aspart (NOVOLOG) 100 UNIT/ML injection Inject 15 Units into the skin 3 (three) times daily before meals. Pt also uses per sliding scale:    150-200:  17 units  201-300:  19 units  301-400:  23 units  401-500:  27 units  Greater than 500:  30 units and call MD    . insulin glargine (LANTUS) 100 UNIT/ML injection Inject 0.3 mLs (30 Units total) into the skin daily at 10 pm. 10 mL 11  . isosorbide mononitrate (IMDUR) 30 MG 24 hr tablet Take 30 mg by mouth daily.    [name] ketoconazole (NIZORAL)  2 % cream Apply 1 application topically 2 (two) times daily as needed for irritation.     . lidocaine (LIDODERM) 5 % Place 1 patch onto the skin daily. Remove & Discard patch within 12 hours or as directed by MD 30 patch 0  . lisinopril (PRINIVIL,ZESTRIL) 10 MG tablet Take 10 mg by mouth daily.    [name] lubiprostone (AMITIZA) 24 MCG capsule Take 24 mcg by mouth daily.     . metoprolol tartrate (LOPRESSOR) 25 MG tablet Take 25 mg by mouth 2 (two) times daily.    [name] NIFEdipine (ADALAT CC) 90 MG 24 hr tablet Take 90 mg by mouth daily.    [name] nystatin (MYCOSTATIN/NYSTOP) 100000 UNIT/GM POWD Apply 1 g topically 2 (two) times daily.    [name] omeprazole (PRILOSEC) 20 MG capsule Take 20 mg by mouth daily.    . paliperidone (INVEGA SUSTENNA) 234 MG/1.5ML SUSP injection  Inject 234 mg into the muscle every 28 (twenty-eight) days.    . polyethylene glycol powder (GLYCOLAX/MIRALAX) powder Take 17 g by mouth 2 (two) times daily as needed. 3350 g 1  . thiothixene (NAVANE) 10 MG capsule Take 1 capsule (10 mg total) by mouth at bedtime. 30 capsule 0  . torsemide (DEMADEX) 20 MG tablet Take 1 tablet (20 mg total) by mouth 2 (two) times daily. 60 tablet 0  . Vitamin D, Ergocalciferol, (DRISDOL) 50000 UNITS CAPS capsule Take 50,000 Units by mouth every 7 (seven) days. On Wednesday.     No facility-administered medications prior to visit.    ROS Review of Systems  Constitutional: Negative for fever, chills, fatigue and unexpected weight change.  Eyes: Negative for visual disturbance.  Respiratory: Negative for cough and shortness of breath.   Cardiovascular: Negative for chest pain, palpitations and leg swelling.  Gastrointestinal: Negative for nausea, vomiting, abdominal pain, diarrhea, constipation and blood in stool.  Endocrine: Negative for polydipsia, polyphagia and polyuria.  Musculoskeletal: Positive for back pain. Negative for myalgias, arthralgias, gait problem and neck pain.  Skin: Negative for rash.  Allergic/Immunologic: Negative for immunocompromised state.  Hematological: Negative for adenopathy. Does not bruise/bleed easily.  Psychiatric/Behavioral: Negative for suicidal ideas, sleep disturbance and dysphoric mood. The patient is nervous/anxious.     Objective:  BP 137/80 mmHg  Pulse 79  Temp(Src) 97.9 F (36.6 C) (Oral)  Resp 16  Ht 6' (1.829 m)  Wt 226 lb (102.513 kg)  BMI 30.64 kg/m2  SpO2 98%  BP/Weight 01/02/2016 12/09/2015 [id number]  Systolic BP 0000000 [id number]  Diastolic BP 80 70 67  Wt. (Lbs) 226 225 230  BMI 30.64 31.39 32.09  Some encounter information is confidential and restricted. Go to Review Flowsheets activity to see all data.   Physical Exam  Constitutional: He appears well-developed and well-nourished. No distress.  HENT:    Head: Normocephalic and atraumatic.  Neck: Normal range of motion. Neck supple.  Cardiovascular: Normal rate, regular rhythm, normal heart sounds and intact distal pulses.   Pulmonary/Chest: Effort normal and breath sounds normal.  Musculoskeletal: He exhibits no edema.  Neurological: He is alert.  Skin: Skin is warm and dry. No rash noted. No erythema.     Psychiatric: He has a normal mood and affect.    CBG 385 Lab Results  Component Value Date   HGBA1C 6.6* 12/11/2015    GAD 7 : Generalized Anxiety Score 01/02/2016  Nervous, Anxious, on Edge 1  Control/stop worrying 1  Worry too much - different things 1  Trouble relaxing 1  Restless  0  Easily annoyed or irritable 0  Afraid - awful might happen 0  Total GAD 7 Score 4    Depression screen [facility] 2/9 01/02/2016 05/26/2015  Decreased Interest 2 0  Down, Depressed, Hopeless 1 0  PHQ - 2 Score 3 0  Altered sleeping 1 -  Tired, decreased energy 3 -  Change in appetite 2 -  Feeling bad or failure about yourself  1 -  Trouble concentrating 1 -  Moving slowly or fidgety/restless 0 -  Suicidal thoughts 0 -  PHQ-9 Score 11 -    Assessment & Plan:   [name] was seen today for diabetes.  Diagnoses and all orders for this visit:  Type 2 diabetes mellitus without complication, with long-term current use of insulin (HCC) -     Glucose (CBG) -     POCT urinalysis dipstick -     atorvastatin (LIPITOR) 40 MG tablet; Take 1 tablet (40 mg total) by mouth daily. Reported on 01/02/2016 -     aspirin EC 81 MG tablet; Take 1 tablet (81 mg total) by mouth daily. Reported on 01/02/2016 -     insulin aspart (NOVOLOG) 100 UNIT/ML injection; Inject 15 Units into the skin 3 (three) times daily before meals. Reported on 01/02/2016 -     insulin glargine (LANTUS) 100 UNIT/ML injection; Inject 0.3 mLs (30 Units total) into the skin daily at 10 pm.  Controlled type 2 diabetes mellitus without complication, with long-term current use of insulin  (HCC)  Hyperglycemia -     insulin aspart (novoLOG) injection 20 Units; Inject 0.2 mLs (20 Units total) into the skin once. -     Glucose (CBG)  Chronic low back pain -     acetaminophen (TYLENOL) 500 MG tablet; Take 1 tablet (500 mg total) by mouth every 8 (eight) hours as needed for mild pain. -     gabapentin (NEURONTIN) 400 MG capsule; Take 1 capsule (400 mg total) by mouth 3 (three) times daily.  Chronic obstructive pulmonary disease, unspecified COPD type ([place]) -     Ambulatory referral to Pain Clinic  Chronic obstructive pulmonary disease, unspecified COPD, unspecified chronic bronchitis type -     albuterol (PROVENTIL HFA;VENTOLIN HFA) 108 (90 Base) MCG/ACT inhaler; Inhale 2 puffs into the lungs every 6 (six) hours as needed for wheezing or shortness of breath. -     Fluticasone-Salmeterol (ADVAIR) 100-50 MCG/DOSE AEPB; Inhale 1 puff into the lungs 2 (two) times daily.  Allergic rhinitis, unspecified allergic rhinitis type -     cetirizine (ZYRTEC) 10 MG tablet; Take 1 tablet (10 mg total) by mouth daily. -     fluticasone (FLONASE) 50 MCG/ACT nasal spray; PLACE TWO   SPRAYS INTO BOTH NOSTRILS DAILY.  Essential hypertension -     isosorbide mononitrate (IMDUR) 30 MG 24 hr tablet; Take 1 tablet (30 mg total) by mouth daily. Reported on 01/02/2016 -     lisinopril (PRINIVIL,ZESTRIL) 10 MG tablet; Take 1 tablet (10 mg total) by mouth daily. Reported on 01/02/2016  Onychomycosis of toenail -     Ambulatory referral to [facility] ordered this encounter  Medications  . insulin aspart (novoLOG) injection 20 Units    Sig:   . acetaminophen (TYLENOL) 500 MG tablet    Sig: Take 1 tablet (500 mg total) by mouth every 8 (eight) hours as needed for mild pain.    Dispense:  90 tablet    Refill:  2  . albuterol (PROVENTIL HFA;VENTOLIN  HFA) 108 (90 Base) MCG/ACT inhaler    Sig: Inhale 2 puffs into the lungs every 6 (six) hours as needed for wheezing or shortness of breath.     Dispense:  1 Inhaler    Refill:  11  . atorvastatin (LIPITOR) 40 MG tablet    Sig: Take 1 tablet (40 mg total) by mouth daily. Reported on 01/02/2016    Dispense:  30 tablet    Refill:  11  . aspirin EC 81 MG tablet    Sig: Take 1 tablet (81 mg total) by mouth daily. Reported on 01/02/2016    Dispense:  30 tablet    Refill:  11  . cetirizine (ZYRTEC) 10 MG tablet    Sig: Take 1 tablet (10 mg total) by mouth daily.    Dispense:  30 tablet    Refill:  11  . fluticasone (FLONASE) 50 MCG/ACT nasal spray    Sig: PLACE TWO   SPRAYS INTO BOTH NOSTRILS DAILY.    Dispense:  9.9 g    Refill:  11  . Fluticasone-Salmeterol (ADVAIR) 100-50 MCG/DOSE AEPB    Sig: Inhale 1 puff into the lungs 2 (two) times daily.    Dispense:  1 each    Refill:  5  . gabapentin (NEURONTIN) 400 MG capsule    Sig: Take 1 capsule (400 mg total) by mouth 3 (three) times daily.    Dispense:  90 capsule    Refill:  5  . insulin aspart (NOVOLOG) 100 UNIT/ML injection    Sig: Inject 15 Units into the skin 3 (three) times daily before meals. Reported on 01/02/2016    Dispense:  10 mL    Refill:  5  . insulin glargine (LANTUS) 100 UNIT/ML injection    Sig: Inject 0.3 mLs (30 Units total) into the skin daily at 10 pm.    Dispense:  10 mL    Refill:  5  . isosorbide mononitrate (IMDUR) 30 MG 24 hr tablet    Sig: Take 1 tablet (30 mg total) by mouth daily. Reported on 01/02/2016    Dispense:  30 tablet    Refill:  11  . lisinopril (PRINIVIL,ZESTRIL) 10 MG tablet    Sig: Take 1 tablet (10 mg total) by mouth daily. Reported on 01/02/2016    Dispense:  30 tablet    Refill:  11    Follow-up: No Follow-up on file.   Boykin Nearing MD

## 2016-01-04 NOTE — Assessment & Plan Note (Signed)
Hx of BZ abuse Patient informed that I will not prescribe BZs

## 2016-01-04 NOTE — Assessment & Plan Note (Signed)
Patient currently denies treatment Note routed to CSW to assist with coordinating psychiatric care since patient has left group home

## 2016-01-04 NOTE — Telephone Encounter (Signed)
Patient seen in office and concerns addressed on 01/02/16

## 2016-01-04 NOTE — Assessment & Plan Note (Signed)
A: hyperglycemia due to med non compliance P: Treated hyperglycemia in office Refilled all meds

## 2016-01-04 NOTE — Assessment & Plan Note (Addendum)
A: chronic back pain P: Tylenol for pain I informed patient that I will not prescribe narcotics or controlled substances due to his recent substance abuse, antifreeze   Previous attempts at pain management for pain clinic in the previous referrals is notes. 1. [facility] Pt don't want to go there  2. [facility] Provider denied the patient  3. [facility] Dr Maryjean Ka declined the referral  4. [place] patient  5. [facility] they need pre autorization from [facility] Is on his medicaid As pcp .

## 2016-01-06 ENCOUNTER — Encounter: Payer: Self-pay | Admitting: Clinical

## 2016-01-06 DIAGNOSIS — R197 Diarrhea, unspecified: Secondary | ICD-10-CM | POA: Diagnosis not present

## 2016-01-06 DIAGNOSIS — G4700 Insomnia, unspecified: Secondary | ICD-10-CM | POA: Diagnosis not present

## 2016-01-06 DIAGNOSIS — E119 Type 2 diabetes mellitus without complications: Secondary | ICD-10-CM | POA: Diagnosis not present

## 2016-01-06 DIAGNOSIS — Z87891 Personal history of nicotine dependence: Secondary | ICD-10-CM | POA: Diagnosis not present

## 2016-01-06 DIAGNOSIS — R109 Unspecified abdominal pain: Secondary | ICD-10-CM | POA: Diagnosis not present

## 2016-01-06 DIAGNOSIS — R1013 Epigastric pain: Secondary | ICD-10-CM | POA: Diagnosis not present

## 2016-01-06 DIAGNOSIS — I10 Essential (primary) hypertension: Secondary | ICD-10-CM | POA: Diagnosis not present

## 2016-01-06 DIAGNOSIS — R739 Hyperglycemia, unspecified: Secondary | ICD-10-CM | POA: Diagnosis not present

## 2016-01-06 DIAGNOSIS — Z7951 Long term (current) use of inhaled steroids: Secondary | ICD-10-CM | POA: Diagnosis not present

## 2016-01-06 DIAGNOSIS — E785 Hyperlipidemia, unspecified: Secondary | ICD-10-CM | POA: Diagnosis not present

## 2016-01-06 DIAGNOSIS — Z7982 Long term (current) use of aspirin: Secondary | ICD-10-CM | POA: Diagnosis not present

## 2016-01-06 DIAGNOSIS — Z79899 Other long term (current) drug therapy: Secondary | ICD-10-CM | POA: Diagnosis not present

## 2016-01-06 DIAGNOSIS — R112 Nausea with vomiting, unspecified: Secondary | ICD-10-CM | POA: Diagnosis not present

## 2016-01-06 DIAGNOSIS — F419 Anxiety disorder, unspecified: Secondary | ICD-10-CM | POA: Diagnosis not present

## 2016-01-06 DIAGNOSIS — J449 Chronic obstructive pulmonary disease, unspecified: Secondary | ICD-10-CM | POA: Diagnosis not present

## 2016-01-06 DIAGNOSIS — F259 Schizoaffective disorder, unspecified: Secondary | ICD-10-CM | POA: Diagnosis not present

## 2016-01-06 DIAGNOSIS — F29 Unspecified psychosis not due to a substance or known physiological condition: Secondary | ICD-10-CM | POA: Diagnosis not present

## 2016-01-06 NOTE — Progress Notes (Signed)
Depression screen [facility] 2/9 01/02/2016 05/26/2015  Decreased Interest 2 0  Down, Depressed, Hopeless 1 0  PHQ - 2 Score 3 0  Altered sleeping 1 -  Tired, decreased energy 3 -  Change in appetite 2 -  Feeling bad or failure about yourself  1 -  Trouble concentrating 1 -  Moving slowly or fidgety/restless 0 -  Suicidal thoughts 0 -  PHQ-9 Score 11 -    .bad7

## 2016-01-07 ENCOUNTER — Telehealth: Payer: Self-pay | Admitting: *Deleted

## 2016-01-07 DIAGNOSIS — F5104 Psychophysiologic insomnia: Secondary | ICD-10-CM

## 2016-01-07 NOTE — Telephone Encounter (Signed)
Pt requesting Rx Ambien  Stated unable to sleep x 4 days

## 2016-01-08 DIAGNOSIS — F5104 Psychophysiologic insomnia: Secondary | ICD-10-CM | POA: Insufficient documentation

## 2016-01-08 MED ORDER — ZOLPIDEM TARTRATE 10 MG PO TABS: 10.0000 mg | ORAL_TABLET | Freq: Every evening | ORAL | Status: DC | PRN

## 2016-01-08 NOTE — Telephone Encounter (Signed)
ambien ordered

## 2016-01-08 NOTE — Telephone Encounter (Signed)
LVM Rx at front office ready to be pickup 

## 2016-01-09 DIAGNOSIS — E871 Hypo-osmolality and hyponatremia: Secondary | ICD-10-CM | POA: Diagnosis not present

## 2016-01-09 DIAGNOSIS — R079 Chest pain, unspecified: Secondary | ICD-10-CM | POA: Diagnosis not present

## 2016-01-09 DIAGNOSIS — R0789 Other chest pain: Secondary | ICD-10-CM | POA: Diagnosis not present

## 2016-01-10 DIAGNOSIS — E871 Hypo-osmolality and hyponatremia: Secondary | ICD-10-CM | POA: Diagnosis not present

## 2016-01-10 DIAGNOSIS — R079 Chest pain, unspecified: Secondary | ICD-10-CM | POA: Diagnosis not present

## 2016-01-12 ENCOUNTER — Telehealth: Payer: Self-pay | Admitting: Family Medicine

## 2016-01-12 DIAGNOSIS — G8929 Other chronic pain: Secondary | ICD-10-CM

## 2016-01-12 DIAGNOSIS — M545 Low back pain, unspecified: Secondary | ICD-10-CM

## 2016-01-12 NOTE — Telephone Encounter (Signed)
Patient is requesting Tylenol # 3 and Klonopin..patient states he can sleep, eat, feels agitated and experiencing anxiety...he states he is not suicidal he is sick....please follow up

## 2016-01-13 MED ORDER — ACETAMINOPHEN-CODEINE #3 300-30 MG PO TABS: 1.0000 | ORAL_TABLET | Freq: Two times a day (BID) | ORAL | Status: DC | PRN

## 2016-01-13 NOTE — Telephone Encounter (Signed)
Pt requesting to fax Rx to pharmacy [facility] pharmacy   Pt requesting Rx Klonopin

## 2016-01-13 NOTE — Telephone Encounter (Signed)
Pt. Called requesting for his [name] to be faxed over to [facility] in [place]. Pt. staed he does not have a way to get here. Pt. Would also like his Tylenol # 3 to be faxed to [facility]. Please f/u

## 2016-01-13 NOTE — Telephone Encounter (Signed)
Rx faxed to [facility]

## 2016-01-13 NOTE — Telephone Encounter (Signed)
Pt. Called requesting for his Lorrin Mais to be faxed over to [facility] in [place]. Pt. staed he does not have a way to get here. Please f/u

## 2016-01-13 NOTE — Telephone Encounter (Signed)
Klonopin refused Patient advised to seek mental health for anxiety Patient has limited pain management options at this point, so I will treat with tylenol #3 Please fax tylenol #3 to his pharmacy

## 2016-01-14 ENCOUNTER — Telehealth: Payer: Self-pay | Admitting: Family Medicine

## 2016-01-14 NOTE — Telephone Encounter (Signed)
Patient has refills of his insulin from, 01/02/16. I do not know the patient and cannot adjust his dose. Will defer any changes to Dr. Adrian Blackwater.

## 2016-01-14 NOTE — Telephone Encounter (Signed)
PATIENT is stating he is out of his insulin, he needs a higher dosage....  Patient needs a laxative as well.   Please call patient as soon as possible

## 2016-01-14 NOTE — Telephone Encounter (Signed)
Patient called stating that they had a sugar of 500. Please follow up.

## 2016-01-15 MED ORDER — LACTULOSE 10 GM/15ML PO SOLN: 10.0000 g | Freq: Three times a day (TID) | ORAL | Status: DC

## 2016-01-15 NOTE — Telephone Encounter (Signed)
I will need more information regarding his blood sugars prior to my being able to adjust his insulin. Please obtain a 24-hour blood sugar log from the patient: Fasting sugar, postprandial sugars and bedtime sugar after which I will be able to adjust his insulin if need be. I have called in a laxative to his pharmacy.

## 2016-01-16 NOTE — Telephone Encounter (Signed)
Pt mom stated pt taking Novolog 2 to 3 time per day  Not taking Lantus  Advised to take Novolog 15 unit 3x per day before meals  Lantus 30 units at 10 pm  Notified Rx was send to [facility] with refills Mother stated pt very agitated,  Need refill Klonopin

## 2016-01-19 ENCOUNTER — Telehealth: Payer: Self-pay | Admitting: Family Medicine

## 2016-01-19 DIAGNOSIS — F251 Schizoaffective disorder, depressive type: Secondary | ICD-10-CM

## 2016-01-19 MED ORDER — HYDROXYZINE HCL 10 MG PO TABS: 10.0000 mg | ORAL_TABLET | Freq: Three times a day (TID) | ORAL | Status: DC | PRN

## 2016-01-19 NOTE — Telephone Encounter (Signed)
Pt. Called requesting an Rx for Klonopin. Please f/u

## 2016-01-19 NOTE — Telephone Encounter (Signed)
No to the klonopin given patient high risk behavior, drinking antifreeze in recent past Also patient currently on opiod pain medicine  I have prescribed atarax which patient can try prn anxiety Patient advised to follow up with mental health.

## 2016-01-19 NOTE — Telephone Encounter (Signed)
Not taking Lantus could explain high sugars. Advised to comply with Lantus. Klonopin to be addressed by PCP

## 2016-01-20 NOTE — Telephone Encounter (Signed)
LVM to return call.

## 2016-01-23 DIAGNOSIS — Z794 Long term (current) use of insulin: Secondary | ICD-10-CM | POA: Diagnosis not present

## 2016-01-23 DIAGNOSIS — Z888 Allergy status to other drugs, medicaments and biological substances status: Secondary | ICD-10-CM | POA: Diagnosis not present

## 2016-01-23 DIAGNOSIS — E785 Hyperlipidemia, unspecified: Secondary | ICD-10-CM | POA: Diagnosis not present

## 2016-01-23 DIAGNOSIS — R079 Chest pain, unspecified: Secondary | ICD-10-CM | POA: Diagnosis not present

## 2016-01-23 DIAGNOSIS — Z8249 Family history of ischemic heart disease and other diseases of the circulatory system: Secondary | ICD-10-CM | POA: Diagnosis not present

## 2016-01-23 DIAGNOSIS — F259 Schizoaffective disorder, unspecified: Secondary | ICD-10-CM | POA: Diagnosis not present

## 2016-01-23 DIAGNOSIS — Z7982 Long term (current) use of aspirin: Secondary | ICD-10-CM | POA: Diagnosis not present

## 2016-01-23 DIAGNOSIS — F17200 Nicotine dependence, unspecified, uncomplicated: Secondary | ICD-10-CM | POA: Diagnosis present

## 2016-01-23 DIAGNOSIS — E119 Type 2 diabetes mellitus without complications: Secondary | ICD-10-CM | POA: Diagnosis not present

## 2016-01-23 DIAGNOSIS — Z79899 Other long term (current) drug therapy: Secondary | ICD-10-CM | POA: Diagnosis not present

## 2016-01-23 DIAGNOSIS — R0789 Other chest pain: Secondary | ICD-10-CM | POA: Diagnosis present

## 2016-01-23 DIAGNOSIS — E871 Hypo-osmolality and hyponatremia: Secondary | ICD-10-CM | POA: Diagnosis not present

## 2016-01-23 DIAGNOSIS — Z7951 Long term (current) use of inhaled steroids: Secondary | ICD-10-CM | POA: Diagnosis not present

## 2016-01-23 DIAGNOSIS — R1084 Generalized abdominal pain: Secondary | ICD-10-CM | POA: Diagnosis not present

## 2016-01-23 DIAGNOSIS — I10 Essential (primary) hypertension: Secondary | ICD-10-CM | POA: Diagnosis not present

## 2016-01-23 DIAGNOSIS — R739 Hyperglycemia, unspecified: Secondary | ICD-10-CM | POA: Diagnosis not present

## 2016-01-23 DIAGNOSIS — J449 Chronic obstructive pulmonary disease, unspecified: Secondary | ICD-10-CM | POA: Diagnosis present

## 2016-01-23 DIAGNOSIS — I959 Hypotension, unspecified: Secondary | ICD-10-CM | POA: Diagnosis not present

## 2016-01-23 DIAGNOSIS — R109 Unspecified abdominal pain: Secondary | ICD-10-CM | POA: Diagnosis not present

## 2016-01-23 NOTE — Telephone Encounter (Signed)
Pt. Called requesting a hospital bed and a shower chair. Please f/u with pt.

## 2016-01-28 NOTE — Telephone Encounter (Signed)
Patient called to request a referral to a pain clinic for his stomach pains. Please f/u with pt.

## 2016-01-29 NOTE — Telephone Encounter (Signed)
Please inform patient  1. Pain management does not treat chronic abdominal pain. I have referred him for his chronic low back pain. He is limited as he has been treated at multiple pain management clinics already.   2. [facility], he medically does not qualify for a hospital bed.   Dr. Adrian Blackwater

## 2016-01-30 DIAGNOSIS — M545 Low back pain: Secondary | ICD-10-CM | POA: Diagnosis not present

## 2016-01-30 DIAGNOSIS — I6789 Other cerebrovascular disease: Secondary | ICD-10-CM | POA: Diagnosis not present

## 2016-01-30 DIAGNOSIS — F419 Anxiety disorder, unspecified: Secondary | ICD-10-CM | POA: Diagnosis not present

## 2016-01-30 DIAGNOSIS — J449 Chronic obstructive pulmonary disease, unspecified: Secondary | ICD-10-CM | POA: Diagnosis not present

## 2016-01-30 DIAGNOSIS — R079 Chest pain, unspecified: Secondary | ICD-10-CM | POA: Diagnosis not present

## 2016-01-30 DIAGNOSIS — E119 Type 2 diabetes mellitus without complications: Secondary | ICD-10-CM | POA: Diagnosis not present

## 2016-01-30 DIAGNOSIS — H9313 Tinnitus, bilateral: Secondary | ICD-10-CM | POA: Diagnosis not present

## 2016-01-30 DIAGNOSIS — I10 Essential (primary) hypertension: Secondary | ICD-10-CM | POA: Diagnosis not present

## 2016-01-30 DIAGNOSIS — R440 Auditory hallucinations: Secondary | ICD-10-CM | POA: Diagnosis not present

## 2016-01-30 DIAGNOSIS — F17200 Nicotine dependence, unspecified, uncomplicated: Secondary | ICD-10-CM | POA: Diagnosis not present

## 2016-01-30 DIAGNOSIS — G8929 Other chronic pain: Secondary | ICD-10-CM | POA: Diagnosis not present

## 2016-02-02 NOTE — Telephone Encounter (Signed)
Pt. Called requesting a med refill on Tylenol # 3. Please f/u with pt. °

## 2016-02-03 DIAGNOSIS — F419 Anxiety disorder, unspecified: Secondary | ICD-10-CM | POA: Diagnosis not present

## 2016-02-03 DIAGNOSIS — R0602 Shortness of breath: Secondary | ICD-10-CM | POA: Diagnosis not present

## 2016-02-03 DIAGNOSIS — G8929 Other chronic pain: Secondary | ICD-10-CM | POA: Diagnosis not present

## 2016-02-03 DIAGNOSIS — J449 Chronic obstructive pulmonary disease, unspecified: Secondary | ICD-10-CM | POA: Diagnosis not present

## 2016-02-03 DIAGNOSIS — Z9114 Patient's other noncompliance with medication regimen: Secondary | ICD-10-CM | POA: Diagnosis not present

## 2016-02-03 DIAGNOSIS — E119 Type 2 diabetes mellitus without complications: Secondary | ICD-10-CM | POA: Diagnosis not present

## 2016-02-03 DIAGNOSIS — F29 Unspecified psychosis not due to a substance or known physiological condition: Secondary | ICD-10-CM | POA: Diagnosis not present

## 2016-02-03 DIAGNOSIS — M545 Low back pain: Secondary | ICD-10-CM | POA: Diagnosis not present

## 2016-02-04 ENCOUNTER — Telehealth: Payer: Self-pay | Admitting: Family Medicine

## 2016-02-04 DIAGNOSIS — F251 Schizoaffective disorder, depressive type: Secondary | ICD-10-CM

## 2016-02-04 NOTE — Telephone Encounter (Signed)
Tylenol #3 will be refilled 30 days from last fill, it is too soon now

## 2016-02-04 NOTE — Telephone Encounter (Signed)
Patient called stating that the medication hydrOXYzine (ATARAX/VISTARIL) 10 MG tablet is inactive in the pharmacy. Please f/u

## 2016-02-05 ENCOUNTER — Telehealth: Payer: Self-pay | Admitting: Family Medicine

## 2016-02-05 NOTE — Telephone Encounter (Signed)
Pt. Called stating that his back and stomach are hurting and he hopes that his PCP can prescribe him Tylenol # 3. Please f/u with pt.

## 2016-02-05 NOTE — Telephone Encounter (Signed)
Pt is calling regarding his blood pressure . Please, call him back  Thank You

## 2016-02-09 ENCOUNTER — Telehealth: Payer: Self-pay | Admitting: Family Medicine

## 2016-02-09 ENCOUNTER — Telehealth: Payer: Self-pay | Admitting: Clinical

## 2016-02-09 DIAGNOSIS — I10 Essential (primary) hypertension: Secondary | ICD-10-CM

## 2016-02-09 DIAGNOSIS — E119 Type 2 diabetes mellitus without complications: Secondary | ICD-10-CM

## 2016-02-09 DIAGNOSIS — F251 Schizoaffective disorder, depressive type: Secondary | ICD-10-CM

## 2016-02-09 DIAGNOSIS — IMO0002 Reserved for concepts with insufficient information to code with codable children: Secondary | ICD-10-CM

## 2016-02-09 DIAGNOSIS — Z794 Long term (current) use of insulin: Secondary | ICD-10-CM

## 2016-02-09 DIAGNOSIS — F1310 Sedative, hypnotic or anxiolytic abuse, uncomplicated: Secondary | ICD-10-CM

## 2016-02-09 MED ORDER — ACETAMINOPHEN-CODEINE #3 300-30 MG PO TABS: 1.0000 | ORAL_TABLET | Freq: Two times a day (BID) | ORAL | Status: DC | PRN

## 2016-02-09 MED ORDER — HYDROXYZINE HCL 10 MG PO TABS: 10.0000 mg | ORAL_TABLET | Freq: Three times a day (TID) | ORAL | Status: DC | PRN

## 2016-02-09 NOTE — Telephone Encounter (Signed)
Attempt to f/u on pt, to find out if he is still seeing [facility] daily. No voicemail set up on [phone] or [phone] numbers (previously listed, no numbers listed current); left HIPPA-compliant message with emergency number listed for pt at [phone] to call [name] from [employer] at [phone].

## 2016-02-09 NOTE — Telephone Encounter (Signed)
Refilled atarax

## 2016-02-09 NOTE — Telephone Encounter (Signed)
[name], please inform patient that Tylenol #3 refilled  [name], patient is suppose to have daily visit from the ACT. He now lives with his mother. At his last OV he reported that he was no longer taking his mental health meds.  He calls in complaining of anxiety. Are you able to check to see if the ACT team is still seeing him.

## 2016-02-10 ENCOUNTER — Telehealth: Payer: Self-pay | Admitting: Clinical

## 2016-02-10 NOTE — Telephone Encounter (Signed)
Pt returned call; says he is on an [facility] in [facility], that they will be at his house this morning. Pt also says that the [employer] is in the process of helping him get into a low-income apartment in [place], [place]. He says he used to see Dr. Lin Landsman, a psychiatrist who is somewhere in [place], and that he would like to see him again because" he knows what works for me and what doesn't"; when pt finds out that Dr. Lin Landsman is at [facility] in [place], he says he wants to make an appointment with him, so is given the number to call and see if Dr. Lin Landsman is available to take any new patients at this time. Pt is not interested in seeing any other psychiatrists at this time, only Dr. Lin Landsman.

## 2016-02-12 NOTE — Telephone Encounter (Signed)
Pt. Called requesting for nurse to fax a list of his current medications to his [facility] pharmacy. Pt. Thinks he is not taking all his medications. Please f/u

## 2016-02-13 NOTE — Telephone Encounter (Signed)
Patient states he needs all his heart medications ,blood thinners.... He is not feeling well, as well as for his anxiety ( Klonopin, or Ativan)

## 2016-02-16 MED ORDER — ATORVASTATIN CALCIUM 40 MG PO TABS: 40.0000 mg | ORAL_TABLET | Freq: Every day | ORAL | Status: DC

## 2016-02-16 MED ORDER — LISINOPRIL 10 MG PO TABS: 10.0000 mg | ORAL_TABLET | Freq: Every day | ORAL | Status: DC

## 2016-02-16 MED ORDER — ISOSORBIDE MONONITRATE ER 30 MG PO TB24: 30.0000 mg | ORAL_TABLET | Freq: Every day | ORAL | Status: DC

## 2016-02-16 MED ORDER — METOPROLOL TARTRATE 25 MG PO TABS: 25.0000 mg | ORAL_TABLET | Freq: Two times a day (BID) | ORAL | Status: DC

## 2016-02-16 MED ORDER — ASPIRIN EC 81 MG PO TBEC: 81.0000 mg | DELAYED_RELEASE_TABLET | Freq: Every day | ORAL | Status: DC

## 2016-02-16 NOTE — Telephone Encounter (Signed)
error 

## 2016-02-16 NOTE — Telephone Encounter (Signed)
Refilled all BP meds Refilled statin Refilled aspirin  I no longer prescribe BZ to patient given hx of substance abuse  He has atarax prescribed for anxiety He is advised to re-establish with psychiatry, I have placed a referral

## 2016-02-16 NOTE — Telephone Encounter (Signed)
Pt called today, informed Tylenol was Faxed to his pharmacy  Pt advised need to go To Mental health due to anxiety

## 2016-02-17 NOTE — Telephone Encounter (Signed)
Pt. Called and stated that he does not use [facility] anymore. Pt new pharmacy is [facility]. Please f/u with pt.

## 2016-02-18 ENCOUNTER — Telehealth: Payer: Self-pay | Admitting: *Deleted

## 2016-02-18 DIAGNOSIS — Z794 Long term (current) use of insulin: Secondary | ICD-10-CM

## 2016-02-18 DIAGNOSIS — G8929 Other chronic pain: Secondary | ICD-10-CM

## 2016-02-18 DIAGNOSIS — E119 Type 2 diabetes mellitus without complications: Secondary | ICD-10-CM

## 2016-02-18 DIAGNOSIS — F5104 Psychophysiologic insomnia: Secondary | ICD-10-CM

## 2016-02-18 DIAGNOSIS — J309 Allergic rhinitis, unspecified: Secondary | ICD-10-CM

## 2016-02-18 DIAGNOSIS — I10 Essential (primary) hypertension: Secondary | ICD-10-CM

## 2016-02-18 DIAGNOSIS — M545 Low back pain, unspecified: Secondary | ICD-10-CM

## 2016-02-18 DIAGNOSIS — F251 Schizoaffective disorder, depressive type: Secondary | ICD-10-CM

## 2016-02-18 NOTE — Telephone Encounter (Signed)
[facility] Called All pt medication was transfer to [facility]

## 2016-02-18 NOTE — Telephone Encounter (Signed)
Pt call today requesting medication refills  Pt was notified Rx was refilled  Pt was  yelling and using profanities.   Advised to lower his voice since it was hard to understand him as he is yelling on the phone. Notified he was going to be reported to my supervisor. He Stated "fuck you and your supervisor"

## 2016-02-18 NOTE — Telephone Encounter (Signed)
Pt. Called requesting to be prescribed a Rx for his nerves. Pt. Stated he needs the medication 3 to 4 times a day. Please f/u with pt.

## 2016-02-19 NOTE — Telephone Encounter (Signed)
Patient called.  Left VM requesting a call back.   When patient calls back, please transfer call to me, Dr. Adrian Blackwater   Atarax has already been prescribed and is written for 3 times a day  If he needs additional treatment for anxiety he needs to see mental health, buspar is an option.  I was notified of his use of profanity on the phone with my RMA yesterday. This is not appropriate and will not be tolerated. His behavior was documented in a safety zone portal by my RMA at my urging.  Additional abusive behavior will be grounds for dismissal from the practice.

## 2016-02-20 MED ORDER — ISOSORBIDE MONONITRATE ER 30 MG PO TB24: 30.0000 mg | ORAL_TABLET | Freq: Every day | ORAL | Status: DC

## 2016-02-20 MED ORDER — GABAPENTIN 400 MG PO CAPS: 400.0000 mg | ORAL_CAPSULE | Freq: Three times a day (TID) | ORAL | Status: DC

## 2016-02-20 MED ORDER — FLUTICASONE PROPIONATE 50 MCG/ACT NA SUSP: NASAL | Status: DC

## 2016-02-20 MED ORDER — CETIRIZINE HCL 10 MG PO TABS: 10.0000 mg | ORAL_TABLET | Freq: Every day | ORAL | Status: DC

## 2016-02-20 MED ORDER — LISINOPRIL 10 MG PO TABS: 10.0000 mg | ORAL_TABLET | Freq: Every day | ORAL | Status: DC

## 2016-02-20 MED ORDER — ZOLPIDEM TARTRATE 10 MG PO TABS: 10.0000 mg | ORAL_TABLET | Freq: Every evening | ORAL | Status: DC | PRN

## 2016-02-20 MED ORDER — METOPROLOL TARTRATE 25 MG PO TABS: 25.0000 mg | ORAL_TABLET | Freq: Two times a day (BID) | ORAL | Status: DC

## 2016-02-20 NOTE — Telephone Encounter (Signed)
Patient called back.  I verified his name and SOB.   1. Informed patient that use of profanity will not be tolerated and will be grounds for dismissal from the practice. He used the word "damn" in the conversation. He stated that he did not realize he had used profanity.   2. Anxiety: "I need something for my nerves". Requested Restoril, ativan something.  He reports atarax causing GI upset so he stopped it. He is having trouble sleeping. Out of ambien. I reminded him that I will no longer prescribe BZ due to his substance abuse and significant mental health problems. I advised psychiatric treatment for anxiety and his report of paranoia. I informed him that I will refill ambien. If his has severe anxiety with paranoia I advised he go to [facility] ED for emergent psychiatric assessment.   3. R hip pain worsening: he is out of tylenol #3. Not taking gabapentin. Does not know why pain is worse. Wonders if he should take an antibiotic. I informed him that if pain is severe and he has fever, he may need an antibiotic. He can go to [facility] or ED for severe symptoms.   He will need a f/u appt with me to evaluate in order to come up with a treatment plan.   4. Bump on penis: no sex in over 20 years. I informed him that he will need a f/u appt to evaluate and come up with a treatment plan.    Called [facility] left VM refill for [employer], please look to get [name] a f/u appt with me at the earliest available

## 2016-02-23 DIAGNOSIS — R1084 Generalized abdominal pain: Secondary | ICD-10-CM | POA: Diagnosis not present

## 2016-02-23 NOTE — Telephone Encounter (Signed)
Called pt. And scheduled f/u appointment whit his PCP on 03/05/16.

## 2016-02-24 ENCOUNTER — Telehealth: Payer: Self-pay | Admitting: Family Medicine

## 2016-02-24 DIAGNOSIS — G8929 Other chronic pain: Secondary | ICD-10-CM

## 2016-02-24 DIAGNOSIS — M545 Low back pain: Principal | ICD-10-CM

## 2016-02-24 NOTE — Telephone Encounter (Signed)
Pt. Called stating that he misplaced his gabapentin (NEURONTIN) 400 MG capsule. Pt. Called his pharmacy and they will not refill it until his PCP calls and gives them authorization for them give give him a refill. Please f/u with pt.

## 2016-02-24 NOTE — Telephone Encounter (Signed)
Patient states he has misplaced his neurontin.... He would like a prescription refill.Marland KitchenMarland KitchenMarland KitchenMarland Kitchenplease advised

## 2016-02-24 NOTE — Telephone Encounter (Signed)
Pt. Called requesting a Rx to help him sleep because the zolpidem (AMBIEN) 10 MG tablet is not helping him. Pt. Also stated he has not ate in 4 days because he needs something for his nerves. He is also requesting pins for his insulin. Please f/u with pt.

## 2016-02-27 MED ORDER — GABAPENTIN 300 MG PO CAPS: 600.0000 mg | ORAL_CAPSULE | Freq: Three times a day (TID) | ORAL | Status: DC

## 2016-02-27 NOTE — Telephone Encounter (Addendum)
Called [facility] patient ran out of gabapentin early, last filled 3 weeks ago. Ran out bc he was taking it 4 times a day instead of 3 times a day.  Plan: Change gabapentin to 600 mg TID. New Rx sent.   Called patient. Verified name and DOB. Informed patient of change above. He agreed with plan and voiced understanding.  He stated that he wonders what makes his stomach tie up in knots. He has OV with me next week, we will address this during his OV.

## 2016-03-01 ENCOUNTER — Telehealth: Payer: Self-pay | Admitting: Family Medicine

## 2016-03-01 DIAGNOSIS — I10 Essential (primary) hypertension: Secondary | ICD-10-CM

## 2016-03-01 DIAGNOSIS — Z114 Encounter for screening for human immunodeficiency virus [HIV]: Secondary | ICD-10-CM

## 2016-03-01 NOTE — Telephone Encounter (Signed)
Too early for refills. Last Rx given  on 02/09/2016

## 2016-03-01 NOTE — Telephone Encounter (Signed)
Patient is experiencing back pain and stomach pain...  Patient would like to know if he can get a refill on his tylenol 3  He understands it is not time for his refill.[name] KitchenMarland [name]

## 2016-03-02 NOTE — Telephone Encounter (Signed)
Pt. Called and requested for his PCP to prescribe his an Rx for his low potassium, sodium, and electrolyte. Please f/u with pt.

## 2016-03-03 ENCOUNTER — Telehealth: Payer: Self-pay | Admitting: Family Medicine

## 2016-03-03 NOTE — Telephone Encounter (Signed)
Patient called stating that they're unable to sleep. Please follow up.  Patient also stated that they've been experiencing stomach pain.

## 2016-03-04 ENCOUNTER — Telehealth: Payer: Self-pay | Admitting: Family Medicine

## 2016-03-04 DIAGNOSIS — M545 Low back pain, unspecified: Secondary | ICD-10-CM

## 2016-03-04 DIAGNOSIS — G8929 Other chronic pain: Secondary | ICD-10-CM

## 2016-03-04 NOTE — Telephone Encounter (Signed)
Patient called and stated they're unable to sleep. That they've been racing and pacing.  Patient needs blood pressure medicine.  Please follow up.

## 2016-03-04 NOTE — Telephone Encounter (Signed)
Patient called stating he has a cough and would like cough medicine. Patient needs blood pressure medicine Please follow up.

## 2016-03-05 ENCOUNTER — Ambulatory Visit: Payer: Self-pay | Admitting: Family Medicine

## 2016-03-05 NOTE — Telephone Encounter (Signed)
Patient is requesting refill on his bp medication and tylenol #3...[name] Kitchenplease follow up

## 2016-03-08 MED ORDER — ACETAMINOPHEN-CODEINE #3 300-30 MG PO TABS: 1.0000 | ORAL_TABLET | Freq: Three times a day (TID) | ORAL | Status: DC | PRN

## 2016-03-08 NOTE — Telephone Encounter (Signed)
Tylenol #3 refilled BP meds at pharmacy Recommend OTC cough medicine for cough

## 2016-03-08 NOTE — Telephone Encounter (Signed)
Patient has been notified tylenol #3 was refilled and is ready for pick up.   Patient states he is unable to pick it up prescription due to transportation.  Please follow up

## 2016-03-08 NOTE — Telephone Encounter (Signed)
All blood pressure medicine was sent at the beginning of the month He has ambien for sleep He needs to see mental health

## 2016-03-08 NOTE — Telephone Encounter (Signed)
Patient missed last OV Last labs 11/2015 He needs to come in for labs in order for me to know how to treat any possible electrolyte imbalances He needs to reschedule his missed appt

## 2016-03-08 NOTE — Telephone Encounter (Signed)
Pt. Called requesting a refill on Tylenol # 3.  Pt. Is in pain. Please f/u with pt.

## 2016-03-09 NOTE — Telephone Encounter (Signed)
Patient needs rx faxed to [facility]. Patient would like medication for congestion. Please follow up.

## 2016-03-10 ENCOUNTER — Other Ambulatory Visit: Payer: Self-pay | Admitting: Family Medicine

## 2016-03-10 ENCOUNTER — Other Ambulatory Visit: Payer: Self-pay | Admitting: Internal Medicine

## 2016-03-10 NOTE — Telephone Encounter (Signed)
Pt notified Rx at front office ready to be pick up, need to sign Substance control agreement  Need ov for congestion  Pt stated do not have transportation. Pt was informed front office will call him when/if we have transportation set up for him  Pt verbalized understanding  [place] office informed

## 2016-03-10 NOTE — Telephone Encounter (Signed)
Patient has been calling every day this week multiple times a day.......[name] KitchenPatient states his prescription for tylenol #3 has been previously faxed to his pharmacy.[name] KitchenMarland KitchenMarland KitchenMarland Kitchenpatient is upset and states he has no way to get up to get to [facility]....[name] KitchenMarland Kitchenplease follow up with patient.[name]

## 2016-03-10 NOTE — Telephone Encounter (Signed)
Unable to contact Pt  Called both number provider, no voice mail    Rx Tylenol #3 at front office, pt need to sign Control substance agreement  Need ov for congestion

## 2016-03-10 NOTE — Telephone Encounter (Signed)
Pt. Called stating that his nurse was going to ask the ofice manager is we could send transportation To [place] and bring his to [facility]. I told pt. That we did not have transportation available and he would Have to come in and pick up his Rx to Tylenol # 3. Pt.got upset and hanged up the phone.

## 2016-03-12 ENCOUNTER — Ambulatory Visit: Payer: Self-pay | Admitting: Anesthesiology

## 2016-03-16 NOTE — Telephone Encounter (Signed)
Pt. Called requesting to speak with the nurse b/c he needs a refill on syringes. Please f/u with pt.

## 2016-03-16 NOTE — Telephone Encounter (Signed)
Pt. Called requesting for his PCP to send the Tylenol # 3 Rx To [facility]. Pt. Was informed that he needs to come in and  Sign the Substance Agreement. He stated he does not have a ride To come to [place], and requested to speak to the nurse.  Please f/u

## 2016-03-18 MED ORDER — "INSULIN SYRINGE-NEEDLE U-100 31G X 5/16"" 0.5 ML MISC": 1.0000 | Freq: Three times a day (TID) | Status: DC

## 2016-03-18 NOTE — Telephone Encounter (Signed)
Needles and syringes filled

## 2016-03-18 NOTE — Addendum Note (Signed)
Addended by: Boykin Nearing on: 03/18/2016 11:02 AM   Modules accepted: Orders

## 2016-03-22 ENCOUNTER — Telehealth: Payer: Self-pay | Admitting: Family Medicine

## 2016-03-22 NOTE — Telephone Encounter (Signed)
Pt. Called and stated that his leg was numb and was not able to move it.  Pt. Was advised to go to urgent care and he stated " I don't have a care and  I can not go anywhere". I told pt. All i could do is put a note in the system for the Nurse to call him back. He started yelling and cursing. I told pt. To lower his voice  And he hanged up the phone. Please f/u

## 2016-03-23 NOTE — Telephone Encounter (Signed)
Pt. Called requesting to speak with his nurse b/c he needs a nurse to got out to his  Home to help him. Pt. Is also requesting a wheelchair b/c he stated he can harley walk. Please f/u

## 2016-03-24 DIAGNOSIS — E785 Hyperlipidemia, unspecified: Secondary | ICD-10-CM | POA: Diagnosis not present

## 2016-03-24 DIAGNOSIS — S8392XA Sprain of unspecified site of left knee, initial encounter: Secondary | ICD-10-CM | POA: Diagnosis not present

## 2016-03-24 DIAGNOSIS — F259 Schizoaffective disorder, unspecified: Secondary | ICD-10-CM | POA: Diagnosis not present

## 2016-03-24 DIAGNOSIS — E872 Acidosis: Secondary | ICD-10-CM | POA: Diagnosis not present

## 2016-03-24 DIAGNOSIS — S0990XA Unspecified injury of head, initial encounter: Secondary | ICD-10-CM | POA: Diagnosis not present

## 2016-03-24 DIAGNOSIS — R918 Other nonspecific abnormal finding of lung field: Secondary | ICD-10-CM | POA: Diagnosis not present

## 2016-03-24 DIAGNOSIS — Z794 Long term (current) use of insulin: Secondary | ICD-10-CM | POA: Diagnosis not present

## 2016-03-24 DIAGNOSIS — G8929 Other chronic pain: Secondary | ICD-10-CM | POA: Diagnosis not present

## 2016-03-24 DIAGNOSIS — I10 Essential (primary) hypertension: Secondary | ICD-10-CM | POA: Diagnosis not present

## 2016-03-24 DIAGNOSIS — S199XXA Unspecified injury of neck, initial encounter: Secondary | ICD-10-CM | POA: Diagnosis not present

## 2016-03-24 DIAGNOSIS — E119 Type 2 diabetes mellitus without complications: Secondary | ICD-10-CM | POA: Diagnosis not present

## 2016-03-24 DIAGNOSIS — J449 Chronic obstructive pulmonary disease, unspecified: Secondary | ICD-10-CM | POA: Diagnosis not present

## 2016-03-24 DIAGNOSIS — J189 Pneumonia, unspecified organism: Secondary | ICD-10-CM | POA: Diagnosis not present

## 2016-03-24 DIAGNOSIS — K219 Gastro-esophageal reflux disease without esophagitis: Secondary | ICD-10-CM | POA: Diagnosis not present

## 2016-03-24 DIAGNOSIS — M25562 Pain in left knee: Secondary | ICD-10-CM | POA: Diagnosis not present

## 2016-03-24 DIAGNOSIS — S50312A Abrasion of left elbow, initial encounter: Secondary | ICD-10-CM | POA: Diagnosis not present

## 2016-03-24 DIAGNOSIS — F17200 Nicotine dependence, unspecified, uncomplicated: Secondary | ICD-10-CM | POA: Diagnosis not present

## 2016-03-24 DIAGNOSIS — M545 Low back pain: Secondary | ICD-10-CM | POA: Diagnosis not present

## 2016-03-24 DIAGNOSIS — W0110XA Fall on same level from slipping, tripping and stumbling with subsequent striking against unspecified object, initial encounter: Secondary | ICD-10-CM | POA: Diagnosis not present

## 2016-03-25 DIAGNOSIS — M25551 Pain in right hip: Secondary | ICD-10-CM | POA: Diagnosis not present

## 2016-03-25 DIAGNOSIS — E871 Hypo-osmolality and hyponatremia: Secondary | ICD-10-CM | POA: Diagnosis not present

## 2016-03-25 DIAGNOSIS — E872 Acidosis: Secondary | ICD-10-CM | POA: Diagnosis not present
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
## 2016-03-26 DIAGNOSIS — E871 Hypo-osmolality and hyponatremia: Secondary | ICD-10-CM | POA: Diagnosis not present

## 2016-03-26 DIAGNOSIS — E872 Acidosis: Secondary | ICD-10-CM | POA: Diagnosis not present

## 2016-03-28 DIAGNOSIS — Z9111 Patient's noncompliance with dietary regimen: Secondary | ICD-10-CM | POA: Diagnosis not present

## 2016-03-28 DIAGNOSIS — F259 Schizoaffective disorder, unspecified: Secondary | ICD-10-CM | POA: Diagnosis not present

## 2016-03-28 DIAGNOSIS — Z794 Long term (current) use of insulin: Secondary | ICD-10-CM | POA: Diagnosis not present

## 2016-03-28 DIAGNOSIS — R531 Weakness: Secondary | ICD-10-CM | POA: Diagnosis not present

## 2016-03-28 DIAGNOSIS — M25562 Pain in left knee: Secondary | ICD-10-CM | POA: Diagnosis not present

## 2016-03-28 DIAGNOSIS — S80212D Abrasion, left knee, subsequent encounter: Secondary | ICD-10-CM | POA: Diagnosis not present

## 2016-03-28 DIAGNOSIS — J449 Chronic obstructive pulmonary disease, unspecified: Secondary | ICD-10-CM | POA: Diagnosis not present

## 2016-03-28 DIAGNOSIS — Z6835 Body mass index (BMI) 35.0-35.9, adult: Secondary | ICD-10-CM | POA: Diagnosis not present

## 2016-03-28 DIAGNOSIS — E785 Hyperlipidemia, unspecified: Secondary | ICD-10-CM | POA: Diagnosis not present

## 2016-03-28 DIAGNOSIS — Z5181 Encounter for therapeutic drug level monitoring: Secondary | ICD-10-CM | POA: Diagnosis not present

## 2016-03-28 DIAGNOSIS — Z9114 Patient's other noncompliance with medication regimen: Secondary | ICD-10-CM | POA: Diagnosis not present

## 2016-03-28 DIAGNOSIS — E669 Obesity, unspecified: Secondary | ICD-10-CM | POA: Diagnosis not present

## 2016-03-28 DIAGNOSIS — F17219 Nicotine dependence, cigarettes, with unspecified nicotine-induced disorders: Secondary | ICD-10-CM | POA: Diagnosis not present

## 2016-03-28 DIAGNOSIS — E119 Type 2 diabetes mellitus without complications: Secondary | ICD-10-CM | POA: Diagnosis not present

## 2016-03-28 DIAGNOSIS — I10 Essential (primary) hypertension: Secondary | ICD-10-CM | POA: Diagnosis not present

## 2016-03-29 ENCOUNTER — Telehealth: Payer: Self-pay | Admitting: Family Medicine

## 2016-03-29 DIAGNOSIS — E119 Type 2 diabetes mellitus without complications: Secondary | ICD-10-CM | POA: Diagnosis not present

## 2016-03-29 DIAGNOSIS — J449 Chronic obstructive pulmonary disease, unspecified: Secondary | ICD-10-CM | POA: Diagnosis not present

## 2016-03-29 DIAGNOSIS — R531 Weakness: Secondary | ICD-10-CM | POA: Diagnosis not present

## 2016-03-29 DIAGNOSIS — I10 Essential (primary) hypertension: Secondary | ICD-10-CM | POA: Diagnosis not present

## 2016-03-29 DIAGNOSIS — S80212D Abrasion, left knee, subsequent encounter: Secondary | ICD-10-CM | POA: Diagnosis not present

## 2016-03-29 DIAGNOSIS — M25562 Pain in left knee: Secondary | ICD-10-CM | POA: Diagnosis not present

## 2016-03-29 MED ORDER — ACETAMINOPHEN-CODEINE #3 300-30 MG PO TABS: 1.0000 | ORAL_TABLET | Freq: Three times a day (TID) | ORAL | Status: DC | PRN
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
## 2016-03-29 NOTE — Telephone Encounter (Signed)
Patient called states he is aware of being released as a patient but would like a list of doctors so he can schedule an appointment

## 2016-03-30 DIAGNOSIS — J449 Chronic obstructive pulmonary disease, unspecified: Secondary | ICD-10-CM | POA: Diagnosis not present

## 2016-03-30 DIAGNOSIS — S80212D Abrasion, left knee, subsequent encounter: Secondary | ICD-10-CM | POA: Diagnosis not present

## 2016-03-30 DIAGNOSIS — R531 Weakness: Secondary | ICD-10-CM | POA: Diagnosis not present

## 2016-03-30 DIAGNOSIS — E119 Type 2 diabetes mellitus without complications: Secondary | ICD-10-CM | POA: Diagnosis not present

## 2016-03-30 DIAGNOSIS — I10 Essential (primary) hypertension: Secondary | ICD-10-CM | POA: Diagnosis not present

## 2016-03-30 DIAGNOSIS — M25562 Pain in left knee: Secondary | ICD-10-CM | POA: Diagnosis not present

## 2016-03-31 ENCOUNTER — Telehealth: Payer: Self-pay | Admitting: Family Medicine
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
## 2016-04-02 DIAGNOSIS — S80212D Abrasion, left knee, subsequent encounter: Secondary | ICD-10-CM | POA: Diagnosis not present

## 2016-04-02 DIAGNOSIS — M25562 Pain in left knee: Secondary | ICD-10-CM | POA: Diagnosis not present

## 2016-04-02 DIAGNOSIS — I10 Essential (primary) hypertension: Secondary | ICD-10-CM | POA: Diagnosis not present

## 2016-04-02 DIAGNOSIS — J449 Chronic obstructive pulmonary disease, unspecified: Secondary | ICD-10-CM | POA: Diagnosis not present

## 2016-04-02 DIAGNOSIS — R531 Weakness: Secondary | ICD-10-CM | POA: Diagnosis not present

## 2016-04-02 DIAGNOSIS — E119 Type 2 diabetes mellitus without complications: Secondary | ICD-10-CM | POA: Diagnosis not present

## 2016-04-18 DIAGNOSIS — R0602 Shortness of breath: Secondary | ICD-10-CM | POA: Diagnosis not present

## 2016-04-18 DIAGNOSIS — R05 Cough: Secondary | ICD-10-CM | POA: Diagnosis not present

## 2016-04-18 DIAGNOSIS — R079 Chest pain, unspecified: Secondary | ICD-10-CM | POA: Diagnosis not present

## 2016-04-18 DIAGNOSIS — R0682 Tachypnea, not elsewhere classified: Secondary | ICD-10-CM | POA: Diagnosis not present

## 2016-04-25 DIAGNOSIS — I10 Essential (primary) hypertension: Secondary | ICD-10-CM | POA: Diagnosis not present

## 2016-04-25 DIAGNOSIS — F419 Anxiety disorder, unspecified: Secondary | ICD-10-CM | POA: Diagnosis not present

## 2016-04-25 DIAGNOSIS — M5441 Lumbago with sciatica, right side: Secondary | ICD-10-CM | POA: Diagnosis not present

## 2016-04-25 DIAGNOSIS — E119 Type 2 diabetes mellitus without complications: Secondary | ICD-10-CM | POA: Diagnosis not present

## 2016-04-25 DIAGNOSIS — M79605 Pain in left leg: Secondary | ICD-10-CM | POA: Diagnosis not present

## 2016-04-25 DIAGNOSIS — F17200 Nicotine dependence, unspecified, uncomplicated: Secondary | ICD-10-CM | POA: Diagnosis not present

## 2016-04-25 DIAGNOSIS — J449 Chronic obstructive pulmonary disease, unspecified: Secondary | ICD-10-CM | POA: Diagnosis not present

## 2016-04-28 DIAGNOSIS — J449 Chronic obstructive pulmonary disease, unspecified: Secondary | ICD-10-CM | POA: Diagnosis not present

## 2016-04-28 DIAGNOSIS — G8929 Other chronic pain: Secondary | ICD-10-CM | POA: Diagnosis not present

## 2016-04-28 DIAGNOSIS — E785 Hyperlipidemia, unspecified: Secondary | ICD-10-CM | POA: Diagnosis not present

## 2016-04-28 DIAGNOSIS — R52 Pain, unspecified: Secondary | ICD-10-CM | POA: Diagnosis not present

## 2016-04-28 DIAGNOSIS — I10 Essential (primary) hypertension: Secondary | ICD-10-CM | POA: Diagnosis not present

## 2016-04-28 DIAGNOSIS — F411 Generalized anxiety disorder: Secondary | ICD-10-CM | POA: Diagnosis not present

## 2016-04-28 DIAGNOSIS — M5442 Lumbago with sciatica, left side: Secondary | ICD-10-CM | POA: Diagnosis not present

## 2016-04-28 DIAGNOSIS — M79605 Pain in left leg: Secondary | ICD-10-CM | POA: Diagnosis not present

## 2016-04-28 DIAGNOSIS — E119 Type 2 diabetes mellitus without complications: Secondary | ICD-10-CM | POA: Diagnosis not present

## 2016-04-28 DIAGNOSIS — F259 Schizoaffective disorder, unspecified: Secondary | ICD-10-CM | POA: Diagnosis not present

## 2016-04-28 DIAGNOSIS — M25552 Pain in left hip: Secondary | ICD-10-CM | POA: Diagnosis not present

## 2016-05-01 DIAGNOSIS — I10 Essential (primary) hypertension: Secondary | ICD-10-CM | POA: Diagnosis not present

## 2016-05-01 DIAGNOSIS — M25552 Pain in left hip: Secondary | ICD-10-CM | POA: Diagnosis not present

## 2016-05-01 DIAGNOSIS — M79605 Pain in left leg: Secondary | ICD-10-CM | POA: Diagnosis not present

## 2016-05-01 DIAGNOSIS — M25559 Pain in unspecified hip: Secondary | ICD-10-CM | POA: Diagnosis not present

## 2016-05-01 DIAGNOSIS — E119 Type 2 diabetes mellitus without complications: Secondary | ICD-10-CM | POA: Diagnosis not present

## 2016-05-01 DIAGNOSIS — R52 Pain, unspecified: Secondary | ICD-10-CM | POA: Diagnosis not present

## 2016-05-01 DIAGNOSIS — J449 Chronic obstructive pulmonary disease, unspecified: Secondary | ICD-10-CM | POA: Diagnosis not present

## 2016-05-03 ENCOUNTER — Telehealth: Payer: Self-pay | Admitting: General Practice

## 2016-05-03 NOTE — Telephone Encounter (Signed)
Noted. Per patient;s discharge letter stated that he would be able to receive 30 days of emergency care until he could re-establish. Additionally, patient canceled or no-showed all of his appointments except for one within since the beginning of the year.  I will defer any additional follow up or comments to clinic leadership.

## 2016-05-03 NOTE — Telephone Encounter (Signed)
Pt. Called stating that he is in pain and has not found a new PCP. Pt would like to speak with someone regarding the pain he has in his hip. He states that his physiatrist told him that he could sue his old PCP b/c she needed to help him until he could find a new PCP.  Please f/u

## 2016-05-04 NOTE — Telephone Encounter (Signed)
Pt. Called stating that his hip hurts and his legs are swollen. Told pt. That he had been discharged from the facility and that he could to urgent care or the ED. Pt. Hanged up the phone.

## 2016-05-13 DIAGNOSIS — J449 Chronic obstructive pulmonary disease, unspecified: Secondary | ICD-10-CM | POA: Diagnosis not present

## 2016-05-13 DIAGNOSIS — L409 Psoriasis, unspecified: Secondary | ICD-10-CM | POA: Diagnosis not present

## 2016-05-13 DIAGNOSIS — I10 Essential (primary) hypertension: Secondary | ICD-10-CM | POA: Diagnosis not present

## 2016-05-13 DIAGNOSIS — M25562 Pain in left knee: Secondary | ICD-10-CM | POA: Diagnosis not present

## 2016-05-13 DIAGNOSIS — E119 Type 2 diabetes mellitus without complications: Secondary | ICD-10-CM | POA: Diagnosis not present

## 2016-05-13 DIAGNOSIS — M461 Sacroiliitis, not elsewhere classified: Secondary | ICD-10-CM | POA: Diagnosis not present

## 2016-05-15 DIAGNOSIS — I10 Essential (primary) hypertension: Secondary | ICD-10-CM | POA: Diagnosis not present

## 2016-05-15 DIAGNOSIS — R05 Cough: Secondary | ICD-10-CM | POA: Diagnosis not present

## 2016-05-15 DIAGNOSIS — R52 Pain, unspecified: Secondary | ICD-10-CM | POA: Diagnosis not present

## 2016-05-15 DIAGNOSIS — R079 Chest pain, unspecified: Secondary | ICD-10-CM | POA: Diagnosis not present

## 2016-05-15 DIAGNOSIS — M79605 Pain in left leg: Secondary | ICD-10-CM | POA: Diagnosis not present

## 2016-05-15 DIAGNOSIS — S8002XA Contusion of left knee, initial encounter: Secondary | ICD-10-CM | POA: Diagnosis not present

## 2016-05-15 DIAGNOSIS — J449 Chronic obstructive pulmonary disease, unspecified: Secondary | ICD-10-CM | POA: Diagnosis not present

## 2016-05-15 DIAGNOSIS — F259 Schizoaffective disorder, unspecified: Secondary | ICD-10-CM | POA: Diagnosis not present

## 2016-05-15 DIAGNOSIS — M25562 Pain in left knee: Secondary | ICD-10-CM | POA: Diagnosis not present

## 2016-05-15 DIAGNOSIS — E119 Type 2 diabetes mellitus without complications: Secondary | ICD-10-CM | POA: Diagnosis not present

## 2016-05-15 DIAGNOSIS — F17200 Nicotine dependence, unspecified, uncomplicated: Secondary | ICD-10-CM | POA: Diagnosis not present

## 2016-05-15 DIAGNOSIS — W1839XA Other fall on same level, initial encounter: Secondary | ICD-10-CM | POA: Diagnosis not present

## 2016-05-15 DIAGNOSIS — R0989 Other specified symptoms and signs involving the circulatory and respiratory systems: Secondary | ICD-10-CM | POA: Diagnosis not present

## 2016-05-15 DIAGNOSIS — R0602 Shortness of breath: Secondary | ICD-10-CM | POA: Diagnosis not present

## 2016-05-15 DIAGNOSIS — S8992XA Unspecified injury of left lower leg, initial encounter: Secondary | ICD-10-CM | POA: Diagnosis not present

## 2016-05-15 DIAGNOSIS — F419 Anxiety disorder, unspecified: Secondary | ICD-10-CM | POA: Diagnosis not present

## 2016-05-18 DIAGNOSIS — E119 Type 2 diabetes mellitus without complications: Secondary | ICD-10-CM | POA: Diagnosis not present

## 2016-05-18 DIAGNOSIS — I10 Essential (primary) hypertension: Secondary | ICD-10-CM | POA: Diagnosis not present

## 2016-05-18 DIAGNOSIS — M25561 Pain in right knee: Secondary | ICD-10-CM | POA: Diagnosis not present

## 2016-05-18 DIAGNOSIS — Z7689 Persons encountering health services in other specified circumstances: Secondary | ICD-10-CM | POA: Diagnosis not present

## 2016-05-18 DIAGNOSIS — K5900 Constipation, unspecified: Secondary | ICD-10-CM | POA: Diagnosis not present

## 2016-05-18 DIAGNOSIS — G8929 Other chronic pain: Secondary | ICD-10-CM | POA: Diagnosis not present

## 2016-05-18 DIAGNOSIS — R1012 Left upper quadrant pain: Secondary | ICD-10-CM | POA: Diagnosis not present

## 2016-05-18 DIAGNOSIS — K469 Unspecified abdominal hernia without obstruction or gangrene: Secondary | ICD-10-CM | POA: Diagnosis not present

## 2016-05-18 DIAGNOSIS — M5442 Lumbago with sciatica, left side: Secondary | ICD-10-CM | POA: Diagnosis not present

## 2016-05-18 DIAGNOSIS — F209 Schizophrenia, unspecified: Secondary | ICD-10-CM | POA: Diagnosis not present

## 2016-05-22 DIAGNOSIS — E119 Type 2 diabetes mellitus without complications: Secondary | ICD-10-CM | POA: Diagnosis not present

## 2016-05-22 DIAGNOSIS — G8929 Other chronic pain: Secondary | ICD-10-CM | POA: Diagnosis not present

## 2016-05-22 DIAGNOSIS — J449 Chronic obstructive pulmonary disease, unspecified: Secondary | ICD-10-CM | POA: Diagnosis not present

## 2016-05-22 DIAGNOSIS — M25562 Pain in left knee: Secondary | ICD-10-CM | POA: Diagnosis not present

## 2016-05-22 DIAGNOSIS — M79605 Pain in left leg: Secondary | ICD-10-CM | POA: Diagnosis not present

## 2016-05-22 DIAGNOSIS — M25552 Pain in left hip: Secondary | ICD-10-CM | POA: Diagnosis not present

## 2016-05-22 DIAGNOSIS — I10 Essential (primary) hypertension: Secondary | ICD-10-CM | POA: Diagnosis not present

## 2016-05-22 DIAGNOSIS — R52 Pain, unspecified: Secondary | ICD-10-CM | POA: Diagnosis not present

## 2016-05-24 DIAGNOSIS — R069 Unspecified abnormalities of breathing: Secondary | ICD-10-CM | POA: Diagnosis not present

## 2016-05-24 DIAGNOSIS — Z5321 Procedure and treatment not carried out due to patient leaving prior to being seen by health care provider: Secondary | ICD-10-CM | POA: Diagnosis not present

## 2016-05-24 DIAGNOSIS — E119 Type 2 diabetes mellitus without complications: Secondary | ICD-10-CM | POA: Diagnosis not present

## 2016-05-24 DIAGNOSIS — F17200 Nicotine dependence, unspecified, uncomplicated: Secondary | ICD-10-CM | POA: Diagnosis not present

## 2016-05-24 DIAGNOSIS — G894 Chronic pain syndrome: Secondary | ICD-10-CM | POA: Diagnosis not present

## 2016-05-24 DIAGNOSIS — M25559 Pain in unspecified hip: Secondary | ICD-10-CM | POA: Diagnosis not present

## 2016-05-24 DIAGNOSIS — R51 Headache: Secondary | ICD-10-CM | POA: Diagnosis not present

## 2016-05-24 DIAGNOSIS — J449 Chronic obstructive pulmonary disease, unspecified: Secondary | ICD-10-CM | POA: Diagnosis not present

## 2016-05-29 ENCOUNTER — Other Ambulatory Visit: Payer: Self-pay | Admitting: Family Medicine

## 2016-05-29 DIAGNOSIS — E119 Type 2 diabetes mellitus without complications: Secondary | ICD-10-CM

## 2016-05-29 DIAGNOSIS — Z794 Long term (current) use of insulin: Principal | ICD-10-CM

## 2016-05-31 ENCOUNTER — Other Ambulatory Visit: Payer: Self-pay | Admitting: Family Medicine

## 2016-05-31 DIAGNOSIS — Z794 Long term (current) use of insulin: Principal | ICD-10-CM

## 2016-05-31 DIAGNOSIS — E119 Type 2 diabetes mellitus without complications: Secondary | ICD-10-CM

## 2016-06-09 DIAGNOSIS — I10 Essential (primary) hypertension: Secondary | ICD-10-CM | POA: Diagnosis not present

## 2016-06-09 DIAGNOSIS — G8929 Other chronic pain: Secondary | ICD-10-CM | POA: Diagnosis not present

## 2016-06-09 DIAGNOSIS — R05 Cough: Secondary | ICD-10-CM | POA: Diagnosis not present

## 2016-06-09 DIAGNOSIS — E119 Type 2 diabetes mellitus without complications: Secondary | ICD-10-CM | POA: Diagnosis not present

## 2016-06-09 DIAGNOSIS — F209 Schizophrenia, unspecified: Secondary | ICD-10-CM | POA: Diagnosis not present

## 2016-06-11 DIAGNOSIS — G8929 Other chronic pain: Secondary | ICD-10-CM | POA: Diagnosis not present

## 2016-06-11 DIAGNOSIS — R0602 Shortness of breath: Secondary | ICD-10-CM | POA: Diagnosis not present

## 2016-06-11 DIAGNOSIS — E119 Type 2 diabetes mellitus without complications: Secondary | ICD-10-CM | POA: Diagnosis not present

## 2016-06-11 DIAGNOSIS — R079 Chest pain, unspecified: Secondary | ICD-10-CM | POA: Diagnosis not present

## 2016-06-11 DIAGNOSIS — I10 Essential (primary) hypertension: Secondary | ICD-10-CM | POA: Diagnosis not present

## 2016-06-11 DIAGNOSIS — F17200 Nicotine dependence, unspecified, uncomplicated: Secondary | ICD-10-CM | POA: Diagnosis not present

## 2016-06-11 DIAGNOSIS — J449 Chronic obstructive pulmonary disease, unspecified: Secondary | ICD-10-CM | POA: Diagnosis not present

## 2016-06-11 DIAGNOSIS — M25562 Pain in left knee: Secondary | ICD-10-CM | POA: Diagnosis not present

## 2016-06-11 DIAGNOSIS — M25552 Pain in left hip: Secondary | ICD-10-CM | POA: Diagnosis not present

## 2016-06-14 ENCOUNTER — Other Ambulatory Visit: Payer: Self-pay | Admitting: Family Medicine

## 2016-06-14 DIAGNOSIS — Z794 Long term (current) use of insulin: Principal | ICD-10-CM

## 2016-06-14 DIAGNOSIS — E119 Type 2 diabetes mellitus without complications: Secondary | ICD-10-CM

## 2016-06-17 DIAGNOSIS — R531 Weakness: Secondary | ICD-10-CM | POA: Diagnosis not present

## 2016-06-17 DIAGNOSIS — E785 Hyperlipidemia, unspecified: Secondary | ICD-10-CM | POA: Diagnosis not present

## 2016-06-17 DIAGNOSIS — E8770 Fluid overload, unspecified: Secondary | ICD-10-CM | POA: Diagnosis not present

## 2016-06-17 DIAGNOSIS — E222 Syndrome of inappropriate secretion of antidiuretic hormone: Secondary | ICD-10-CM | POA: Diagnosis not present

## 2016-06-17 DIAGNOSIS — F259 Schizoaffective disorder, unspecified: Secondary | ICD-10-CM | POA: Diagnosis not present

## 2016-06-17 DIAGNOSIS — E871 Hypo-osmolality and hyponatremia: Secondary | ICD-10-CM | POA: Diagnosis not present

## 2016-06-17 DIAGNOSIS — J449 Chronic obstructive pulmonary disease, unspecified: Secondary | ICD-10-CM | POA: Diagnosis not present

## 2016-06-17 DIAGNOSIS — I10 Essential (primary) hypertension: Secondary | ICD-10-CM | POA: Diagnosis not present

## 2016-06-17 DIAGNOSIS — E119 Type 2 diabetes mellitus without complications: Secondary | ICD-10-CM | POA: Diagnosis not present

## 2016-06-17 DIAGNOSIS — M545 Low back pain: Secondary | ICD-10-CM | POA: Diagnosis not present

## 2016-06-17 DIAGNOSIS — R404 Transient alteration of awareness: Secondary | ICD-10-CM | POA: Diagnosis not present

## 2016-06-18 DIAGNOSIS — E871 Hypo-osmolality and hyponatremia: Secondary | ICD-10-CM | POA: Diagnosis not present

## 2016-06-26 ENCOUNTER — Encounter (HOSPITAL_COMMUNITY): Payer: Self-pay | Admitting: Emergency Medicine

## 2016-06-26 ENCOUNTER — Emergency Department (HOSPITAL_COMMUNITY)
Admission: EM | Admit: 2016-06-26 | Discharge: 2016-06-26 | Disposition: A | Discharge status: Home / Self Care | Payer: Medicare Other | Attending: Dermatology | Admitting: Dermatology

## 2016-06-26 DIAGNOSIS — J449 Chronic obstructive pulmonary disease, unspecified: Secondary | ICD-10-CM | POA: Diagnosis not present

## 2016-06-26 DIAGNOSIS — Z7982 Long term (current) use of aspirin: Secondary | ICD-10-CM | POA: Diagnosis not present

## 2016-06-26 DIAGNOSIS — Z5321 Procedure and treatment not carried out due to patient leaving prior to being seen by health care provider: Secondary | ICD-10-CM | POA: Insufficient documentation

## 2016-06-26 DIAGNOSIS — I10 Essential (primary) hypertension: Secondary | ICD-10-CM | POA: Insufficient documentation

## 2016-06-26 DIAGNOSIS — Z794 Long term (current) use of insulin: Secondary | ICD-10-CM | POA: Diagnosis not present

## 2016-06-26 DIAGNOSIS — E119 Type 2 diabetes mellitus without complications: Secondary | ICD-10-CM | POA: Insufficient documentation

## 2016-06-26 DIAGNOSIS — F17210 Nicotine dependence, cigarettes, uncomplicated: Secondary | ICD-10-CM | POA: Insufficient documentation

## 2016-06-26 DIAGNOSIS — R531 Weakness: Secondary | ICD-10-CM | POA: Insufficient documentation

## 2016-06-26 DIAGNOSIS — R404 Transient alteration of awareness: Secondary | ICD-10-CM | POA: Diagnosis not present

## 2016-06-26 HISTORY — DX: Patient's noncompliance with other medical treatment and regimen due to unspecified reason: Z91.199

## 2016-06-26 HISTORY — DX: Other chronic pain: G89.29

## 2016-06-26 HISTORY — DX: Patient's noncompliance with other medical treatment and regimen: Z91.19

## 2016-06-26 HISTORY — DX: Dorsalgia, unspecified: M54.9

## 2016-06-26 HISTORY — DX: Other psychoactive substance abuse, uncomplicated: F19.10

## 2016-06-26 LAB — CBG MONITORING, ED: GLUCOSE-CAPILLARY: 112 mg/dL — AB (ref 65–99)
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
## 2016-06-26 NOTE — ED Notes (Signed)
Pt not in waiting area.  Pt was upset when brought in by ems that he was not taken to jail.  Stated he was going to leave because he didn't want to be here.

## 2016-06-29 DIAGNOSIS — I10 Essential (primary) hypertension: Secondary | ICD-10-CM | POA: Diagnosis not present

## 2016-06-29 DIAGNOSIS — J449 Chronic obstructive pulmonary disease, unspecified: Secondary | ICD-10-CM | POA: Diagnosis not present

## 2016-06-29 DIAGNOSIS — F259 Schizoaffective disorder, unspecified: Secondary | ICD-10-CM | POA: Diagnosis not present

## 2016-06-29 DIAGNOSIS — E119 Type 2 diabetes mellitus without complications: Secondary | ICD-10-CM | POA: Diagnosis not present

## 2016-07-03 DIAGNOSIS — R1111 Vomiting without nausea: Secondary | ICD-10-CM | POA: Diagnosis not present

## 2016-07-03 DIAGNOSIS — R531 Weakness: Secondary | ICD-10-CM | POA: Diagnosis not present

## 2016-07-03 DIAGNOSIS — R112 Nausea with vomiting, unspecified: Secondary | ICD-10-CM | POA: Diagnosis not present

## 2016-07-03 DIAGNOSIS — F17200 Nicotine dependence, unspecified, uncomplicated: Secondary | ICD-10-CM | POA: Diagnosis not present

## 2016-07-03 DIAGNOSIS — K2970 Gastritis, unspecified, without bleeding: Secondary | ICD-10-CM | POA: Diagnosis not present

## 2016-07-03 DIAGNOSIS — R110 Nausea: Secondary | ICD-10-CM | POA: Diagnosis not present

## 2016-07-03 DIAGNOSIS — E119 Type 2 diabetes mellitus without complications: Secondary | ICD-10-CM | POA: Diagnosis not present

## 2016-07-03 DIAGNOSIS — J449 Chronic obstructive pulmonary disease, unspecified: Secondary | ICD-10-CM | POA: Diagnosis not present

## 2016-08-09 DIAGNOSIS — G8929 Other chronic pain: Secondary | ICD-10-CM | POA: Diagnosis not present

## 2016-08-09 DIAGNOSIS — R39198 Other difficulties with micturition: Secondary | ICD-10-CM | POA: Diagnosis not present

## 2016-08-09 DIAGNOSIS — R062 Wheezing: Secondary | ICD-10-CM | POA: Diagnosis not present

## 2016-09-02 IMAGING — DX DG ABDOMEN ACUTE W/ 1V CHEST
3 series · 3 of 3 positions shown · non-contrast
Comparison: 09/12/2015

CLINICAL DATA: Constipation, lower abdominal pain

EXAM:
DG ABDOMEN ACUTE W/ 1V CHEST

[chest pa]
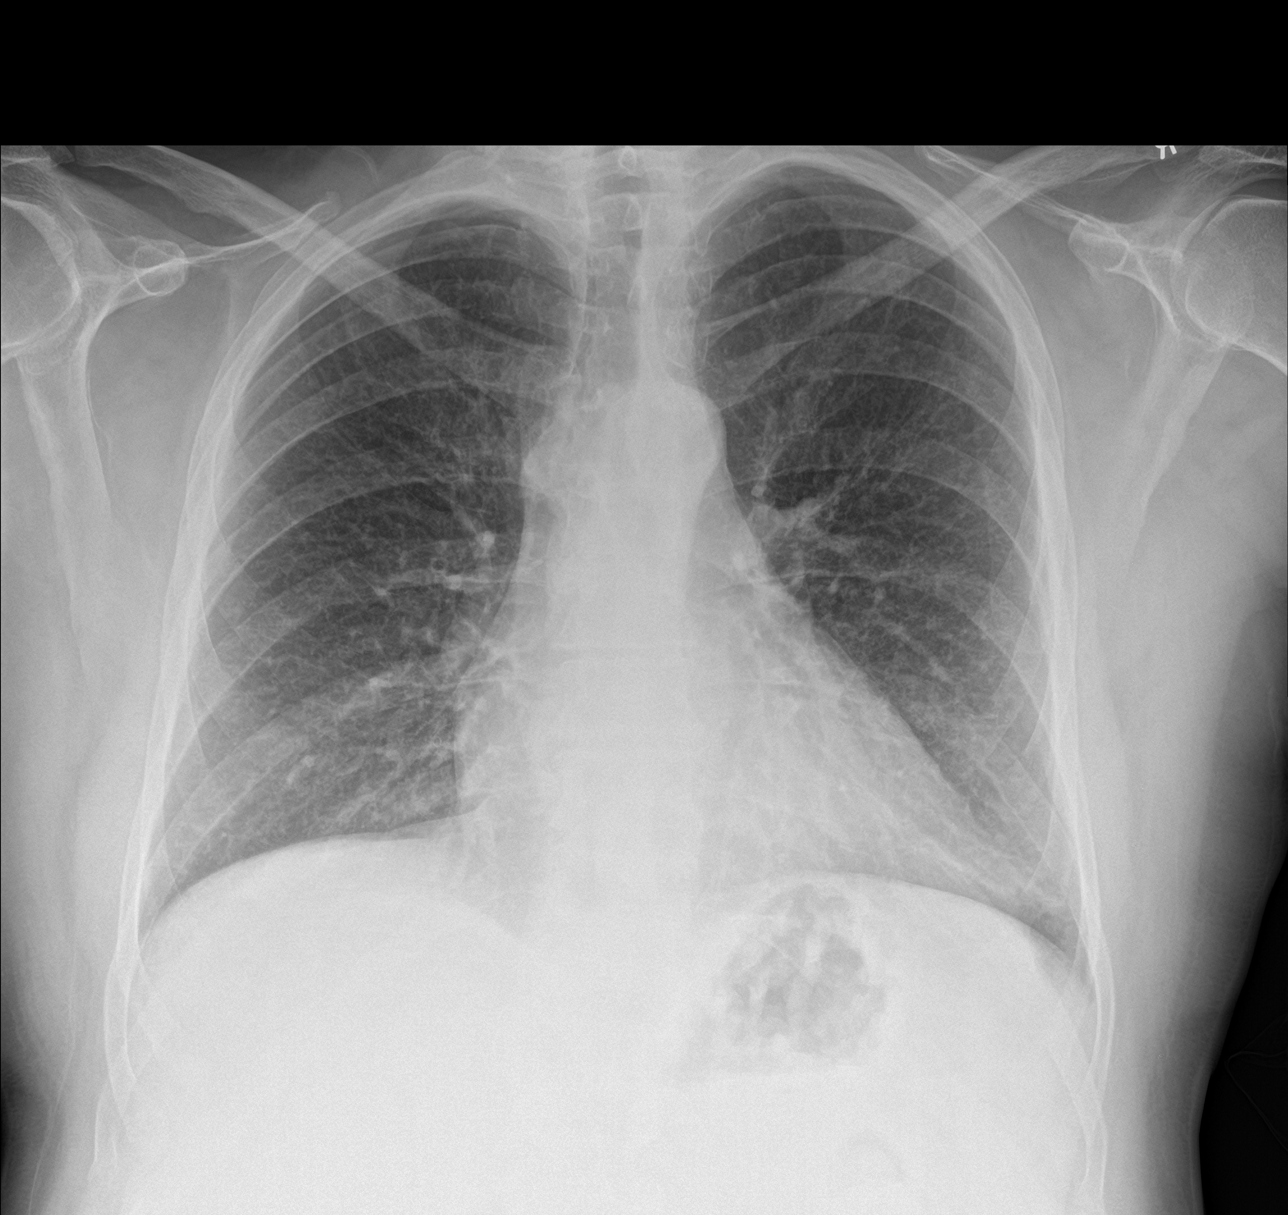

[abdomen erect]
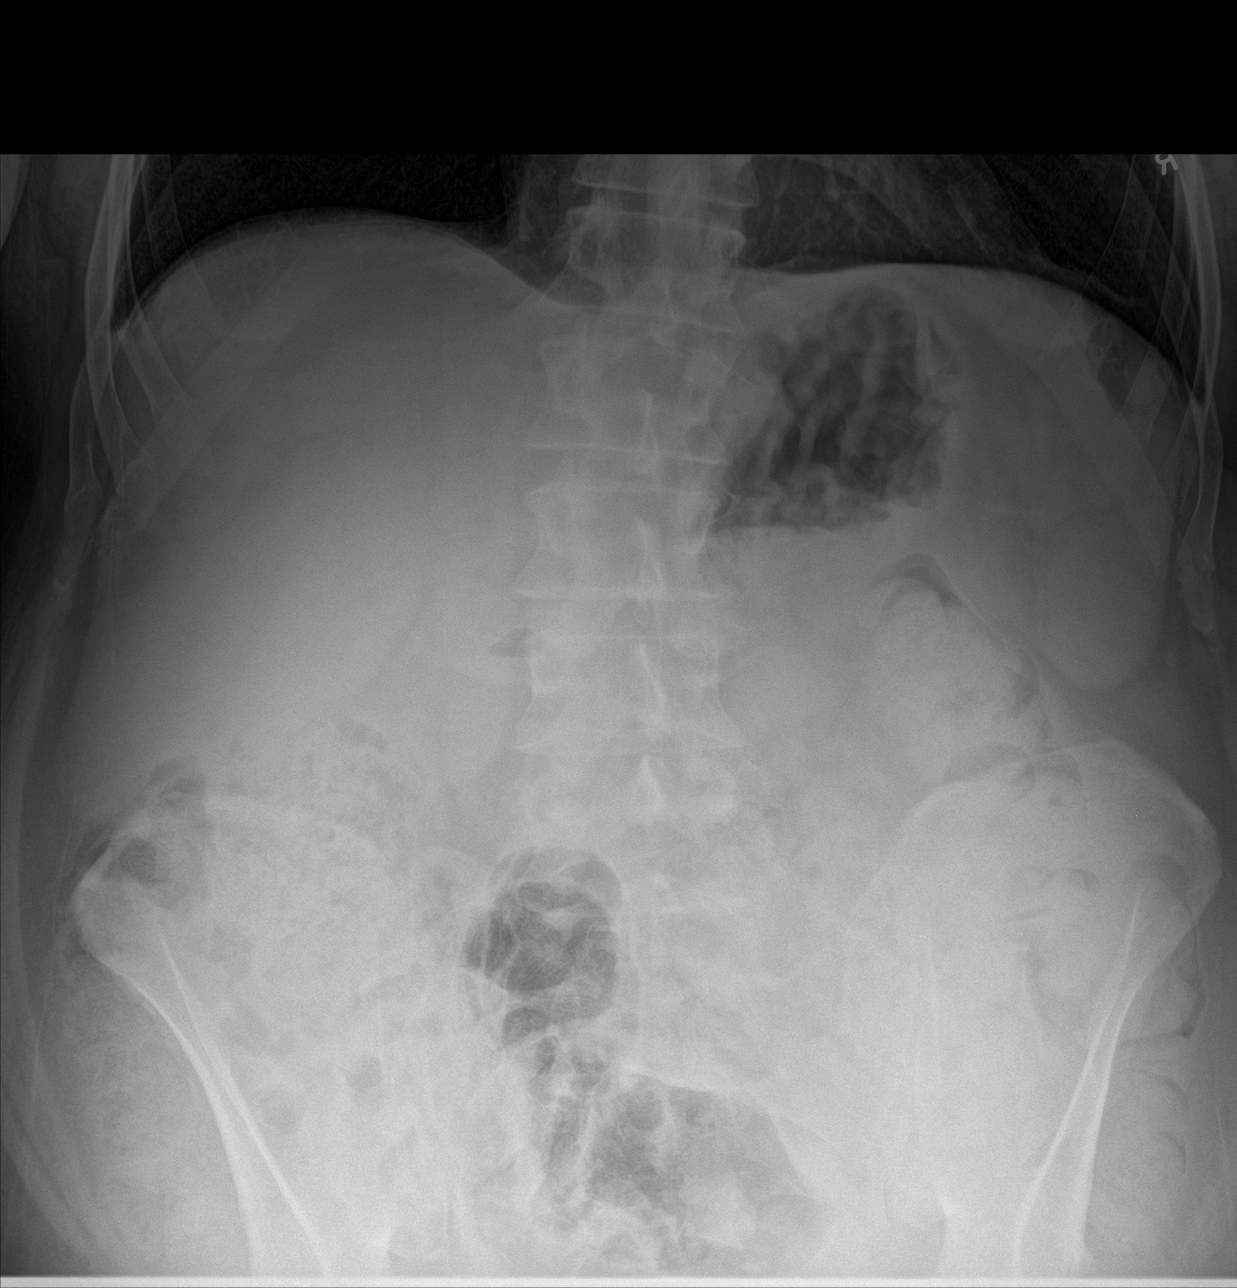

[abdomen supine]
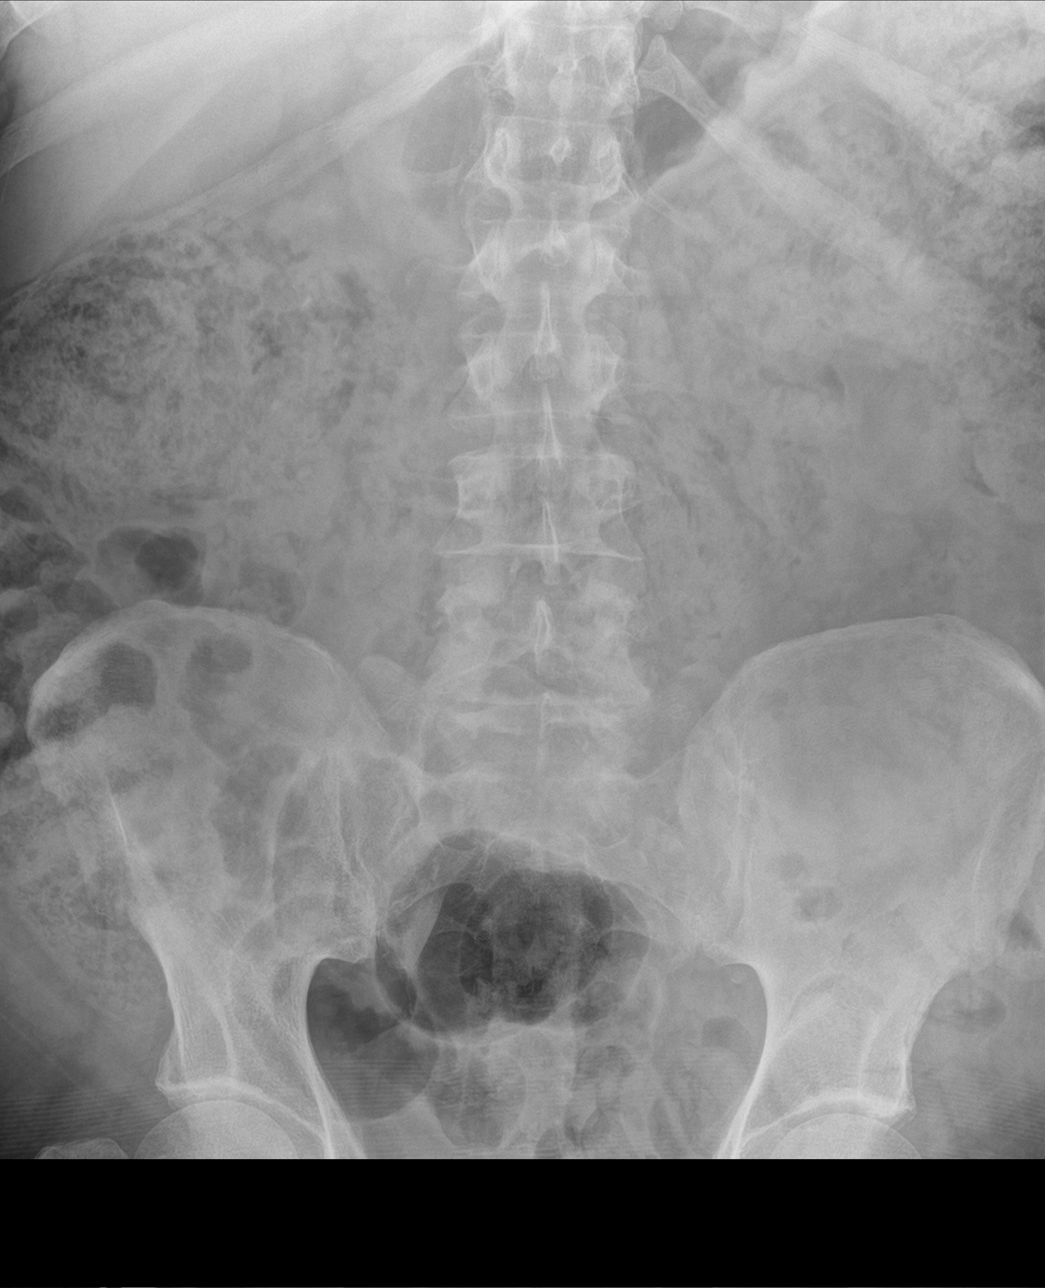

[3 of 3 positions shown; findings below may reference images not displayed]

FINDINGS: Cardiomediastinal silhouette is unremarkable. No acute infiltrate or
pleural effusion. No pulmonary edema. There is normal small bowel
gas pattern. Abundant stool noted throughout the colon. Moderate gas
noted in distal sigmoid colon. Moderate stool noted within rectum.
No free abdominal air.
IMPRESSION: Normal small bowel gas pattern. Abundant colonic stool. No free
abdominal air

## 2016-09-10 ENCOUNTER — Other Ambulatory Visit: Payer: Self-pay | Admitting: Family Medicine

## 2016-09-21 DIAGNOSIS — R071 Chest pain on breathing: Secondary | ICD-10-CM | POA: Diagnosis not present

## 2016-09-21 DIAGNOSIS — E871 Hypo-osmolality and hyponatremia: Secondary | ICD-10-CM | POA: Diagnosis not present

## 2016-09-21 DIAGNOSIS — R0789 Other chest pain: Secondary | ICD-10-CM | POA: Diagnosis not present

## 2016-09-21 DIAGNOSIS — F209 Schizophrenia, unspecified: Secondary | ICD-10-CM | POA: Diagnosis not present

## 2016-09-21 DIAGNOSIS — E1165 Type 2 diabetes mellitus with hyperglycemia: Secondary | ICD-10-CM | POA: Diagnosis not present

## 2016-09-21 DIAGNOSIS — Z886 Allergy status to analgesic agent status: Secondary | ICD-10-CM | POA: Diagnosis not present

## 2016-09-21 DIAGNOSIS — J441 Chronic obstructive pulmonary disease with (acute) exacerbation: Secondary | ICD-10-CM | POA: Diagnosis not present

## 2016-09-21 DIAGNOSIS — J449 Chronic obstructive pulmonary disease, unspecified: Secondary | ICD-10-CM | POA: Diagnosis not present

## 2016-09-21 DIAGNOSIS — Z888 Allergy status to other drugs, medicaments and biological substances status: Secondary | ICD-10-CM | POA: Diagnosis not present

## 2016-09-21 DIAGNOSIS — E785 Hyperlipidemia, unspecified: Secondary | ICD-10-CM | POA: Diagnosis not present

## 2016-09-21 DIAGNOSIS — R079 Chest pain, unspecified: Secondary | ICD-10-CM | POA: Diagnosis not present

## 2016-09-21 DIAGNOSIS — F419 Anxiety disorder, unspecified: Secondary | ICD-10-CM | POA: Diagnosis not present

## 2016-09-21 DIAGNOSIS — I10 Essential (primary) hypertension: Secondary | ICD-10-CM | POA: Diagnosis not present

## 2016-09-21 DIAGNOSIS — E119 Type 2 diabetes mellitus without complications: Secondary | ICD-10-CM | POA: Diagnosis not present

## 2016-09-22 DIAGNOSIS — E1165 Type 2 diabetes mellitus with hyperglycemia: Secondary | ICD-10-CM | POA: Diagnosis not present

## 2016-10-05 ENCOUNTER — Other Ambulatory Visit: Payer: Self-pay | Admitting: Family Medicine

## 2016-10-05 DIAGNOSIS — J449 Chronic obstructive pulmonary disease, unspecified: Secondary | ICD-10-CM | POA: Diagnosis not present

## 2016-10-05 DIAGNOSIS — K469 Unspecified abdominal hernia without obstruction or gangrene: Secondary | ICD-10-CM | POA: Diagnosis not present

## 2016-10-05 DIAGNOSIS — R404 Transient alteration of awareness: Secondary | ICD-10-CM | POA: Diagnosis not present

## 2016-10-05 DIAGNOSIS — F17200 Nicotine dependence, unspecified, uncomplicated: Secondary | ICD-10-CM | POA: Diagnosis not present

## 2016-10-05 DIAGNOSIS — E1165 Type 2 diabetes mellitus with hyperglycemia: Secondary | ICD-10-CM | POA: Diagnosis not present

## 2016-10-05 DIAGNOSIS — Z720 Tobacco use: Secondary | ICD-10-CM | POA: Diagnosis not present

## 2016-10-05 DIAGNOSIS — J441 Chronic obstructive pulmonary disease with (acute) exacerbation: Secondary | ICD-10-CM | POA: Diagnosis not present

## 2016-10-05 DIAGNOSIS — I482 Chronic atrial fibrillation: Secondary | ICD-10-CM | POA: Diagnosis not present

## 2016-10-05 DIAGNOSIS — F419 Anxiety disorder, unspecified: Secondary | ICD-10-CM | POA: Diagnosis not present

## 2016-10-05 DIAGNOSIS — R531 Weakness: Secondary | ICD-10-CM | POA: Diagnosis not present

## 2016-10-05 DIAGNOSIS — E871 Hypo-osmolality and hyponatremia: Secondary | ICD-10-CM | POA: Diagnosis not present

## 2016-10-05 DIAGNOSIS — R062 Wheezing: Secondary | ICD-10-CM | POA: Diagnosis not present

## 2016-10-13 DIAGNOSIS — R0789 Other chest pain: Secondary | ICD-10-CM | POA: Diagnosis not present

## 2016-10-13 DIAGNOSIS — Z765 Malingerer [conscious simulation]: Secondary | ICD-10-CM | POA: Diagnosis not present

## 2016-10-13 DIAGNOSIS — R079 Chest pain, unspecified: Secondary | ICD-10-CM | POA: Diagnosis not present

## 2016-10-13 DIAGNOSIS — E119 Type 2 diabetes mellitus without complications: Secondary | ICD-10-CM | POA: Diagnosis not present

## 2016-10-13 DIAGNOSIS — F259 Schizoaffective disorder, unspecified: Secondary | ICD-10-CM | POA: Diagnosis not present

## 2016-10-13 DIAGNOSIS — F17208 Nicotine dependence, unspecified, with other nicotine-induced disorders: Secondary | ICD-10-CM | POA: Diagnosis not present

## 2016-10-13 DIAGNOSIS — I10 Essential (primary) hypertension: Secondary | ICD-10-CM | POA: Diagnosis not present

## 2016-10-13 DIAGNOSIS — E871 Hypo-osmolality and hyponatremia: Secondary | ICD-10-CM | POA: Diagnosis not present

## 2016-10-13 DIAGNOSIS — Z9119 Patient's noncompliance with other medical treatment and regimen: Secondary | ICD-10-CM | POA: Diagnosis not present

## 2016-10-13 DIAGNOSIS — R061 Stridor: Secondary | ICD-10-CM | POA: Diagnosis not present

## 2016-10-13 DIAGNOSIS — Z7984 Long term (current) use of oral hypoglycemic drugs: Secondary | ICD-10-CM | POA: Diagnosis not present

## 2016-10-13 DIAGNOSIS — Z79899 Other long term (current) drug therapy: Secondary | ICD-10-CM | POA: Diagnosis not present

## 2016-10-22 DIAGNOSIS — J449 Chronic obstructive pulmonary disease, unspecified: Secondary | ICD-10-CM | POA: Diagnosis not present

## 2016-10-22 DIAGNOSIS — F17200 Nicotine dependence, unspecified, uncomplicated: Secondary | ICD-10-CM | POA: Diagnosis not present

## 2016-10-22 DIAGNOSIS — E871 Hypo-osmolality and hyponatremia: Secondary | ICD-10-CM | POA: Diagnosis not present

## 2016-10-22 DIAGNOSIS — Z720 Tobacco use: Secondary | ICD-10-CM | POA: Diagnosis not present

## 2016-10-22 DIAGNOSIS — I10 Essential (primary) hypertension: Secondary | ICD-10-CM | POA: Diagnosis not present

## 2016-10-22 DIAGNOSIS — F419 Anxiety disorder, unspecified: Secondary | ICD-10-CM | POA: Diagnosis not present

## 2016-10-22 DIAGNOSIS — R030 Elevated blood-pressure reading, without diagnosis of hypertension: Secondary | ICD-10-CM | POA: Diagnosis not present

## 2016-10-22 DIAGNOSIS — R112 Nausea with vomiting, unspecified: Secondary | ICD-10-CM | POA: Diagnosis not present

## 2016-10-22 DIAGNOSIS — E119 Type 2 diabetes mellitus without complications: Secondary | ICD-10-CM | POA: Diagnosis not present

## 2016-10-22 DIAGNOSIS — I160 Hypertensive urgency: Secondary | ICD-10-CM | POA: Diagnosis not present

## 2016-10-28 DIAGNOSIS — Z79899 Other long term (current) drug therapy: Secondary | ICD-10-CM | POA: Diagnosis not present

## 2016-10-28 DIAGNOSIS — F250 Schizoaffective disorder, bipolar type: Secondary | ICD-10-CM | POA: Diagnosis not present

## 2016-10-30 DIAGNOSIS — Z720 Tobacco use: Secondary | ICD-10-CM | POA: Diagnosis not present

## 2016-10-30 DIAGNOSIS — E669 Obesity, unspecified: Secondary | ICD-10-CM | POA: Diagnosis not present

## 2016-10-30 DIAGNOSIS — F419 Anxiety disorder, unspecified: Secondary | ICD-10-CM | POA: Diagnosis not present

## 2016-10-30 DIAGNOSIS — Z9114 Patient's other noncompliance with medication regimen: Secondary | ICD-10-CM | POA: Diagnosis not present

## 2016-10-30 DIAGNOSIS — F17200 Nicotine dependence, unspecified, uncomplicated: Secondary | ICD-10-CM | POA: Diagnosis not present

## 2016-10-30 DIAGNOSIS — F410 Panic disorder [episodic paroxysmal anxiety] without agoraphobia: Secondary | ICD-10-CM | POA: Diagnosis not present

## 2016-10-30 DIAGNOSIS — E119 Type 2 diabetes mellitus without complications: Secondary | ICD-10-CM | POA: Diagnosis not present

## 2016-10-30 DIAGNOSIS — I10 Essential (primary) hypertension: Secondary | ICD-10-CM | POA: Diagnosis not present

## 2016-10-30 DIAGNOSIS — J449 Chronic obstructive pulmonary disease, unspecified: Secondary | ICD-10-CM | POA: Diagnosis not present

## 2016-10-30 DIAGNOSIS — F99 Mental disorder, not otherwise specified: Secondary | ICD-10-CM | POA: Diagnosis not present

## 2016-11-11 ENCOUNTER — Ambulatory Visit (INDEPENDENT_AMBULATORY_CARE_PROVIDER_SITE_OTHER): Payer: Medicare Other | Admitting: Family Medicine

## 2016-11-11 ENCOUNTER — Telehealth: Payer: Self-pay | Admitting: Family Medicine

## 2016-11-11 ENCOUNTER — Encounter (INDEPENDENT_AMBULATORY_CARE_PROVIDER_SITE_OTHER): Payer: Self-pay

## 2016-11-11 ENCOUNTER — Encounter: Payer: Self-pay | Admitting: Family Medicine

## 2016-11-11 VITALS — BP 104/65 | HR 73 | Temp 96.8°F | Ht 71.0 in | Wt 224.4 lb

## 2016-11-11 DIAGNOSIS — R3912 Poor urinary stream: Secondary | ICD-10-CM

## 2016-11-11 DIAGNOSIS — F329 Major depressive disorder, single episode, unspecified: Secondary | ICD-10-CM

## 2016-11-11 DIAGNOSIS — F418 Other specified anxiety disorders: Secondary | ICD-10-CM

## 2016-11-11 DIAGNOSIS — E1142 Type 2 diabetes mellitus with diabetic polyneuropathy: Secondary | ICD-10-CM | POA: Diagnosis not present

## 2016-11-11 DIAGNOSIS — F32A Depression, unspecified: Secondary | ICD-10-CM | POA: Insufficient documentation

## 2016-11-11 DIAGNOSIS — Z794 Long term (current) use of insulin: Secondary | ICD-10-CM | POA: Diagnosis not present

## 2016-11-11 DIAGNOSIS — E1122 Type 2 diabetes mellitus with diabetic chronic kidney disease: Secondary | ICD-10-CM | POA: Diagnosis not present

## 2016-11-11 DIAGNOSIS — J432 Centrilobular emphysema: Secondary | ICD-10-CM

## 2016-11-11 DIAGNOSIS — F419 Anxiety disorder, unspecified: Secondary | ICD-10-CM

## 2016-11-11 DIAGNOSIS — E1140 Type 2 diabetes mellitus with diabetic neuropathy, unspecified: Secondary | ICD-10-CM | POA: Insufficient documentation

## 2016-11-11 LAB — BAYER DCA HB A1C WAIVED: HB A1C (BAYER DCA - WAIVED): 7.6 % — ABNORMAL HIGH (ref <7.0)

## 2016-11-11 MED ORDER — DULOXETINE HCL 60 MG PO CPEP: 60.0000 mg | ORAL_CAPSULE | Freq: Every day | ORAL | 1 refills | Status: DC

## 2016-11-11 MED ORDER — BLOOD GLUCOSE TEST VI STRP: 1.0000 | ORAL_STRIP | Freq: Four times a day (QID) | 11 refills | Status: DC | PRN

## 2016-11-11 MED ORDER — ACETAMINOPHEN 650 MG RE SUPP: 650.0000 mg | RECTAL | 11 refills | Status: DC | PRN

## 2016-11-11 MED ORDER — BLOOD GLUCOSE MONITOR SYSTEM W/DEVICE KIT: 1.0000 | PACK | Freq: Every day | 0 refills | Status: DC

## 2016-11-11 MED ORDER — ALBUTEROL SULFATE HFA 108 (90 BASE) MCG/ACT IN AERS: 2.0000 | INHALATION_SPRAY | Freq: Four times a day (QID) | RESPIRATORY_TRACT | 11 refills | Status: DC | PRN

## 2016-11-11 MED ORDER — FLUTICASONE-SALMETEROL 100-50 MCG/DOSE IN AEPB: 1.0000 | INHALATION_SPRAY | Freq: Two times a day (BID) | RESPIRATORY_TRACT | 5 refills | Status: DC

## 2016-11-11 NOTE — Progress Notes (Signed)
BP 104/65   Pulse 73   Temp (!) 96.8 F (36 C) (Oral)   Ht '5\' 11"'  (1.803 m)   Wt 224 lb 6.4 oz (101.8 kg)   BMI 31.30 kg/m    Subjective:    Patient ID: [name], male    DOB: [birth year], 56 y.o.   MRN: [id number]  HPI: [name] is a 56 y.o. male presenting on 11/11/2016 for Establish Care   HPI COPD recheck Patient is coming in to establish care as a new patient with her office. He has been diagnosed with COPD and was previously on Advair and Proventil but has been out of both of those and has been having the cult is with breathing because of it. Been having wheezing especially at night but is worse. He says he is having some wheezing and coughing spells today but it is not too severe per patient. He denies any fevers or chills. His cough is mostly dry and nonproductive.  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 He does not know his last hemoglobin A1c is. He has not seen an ophthalmologist recently. He is coming from her previous provider who did do blood work within 5 or 6 months but cannot remember what any of it was. He is currently on an ACE inhibitor. He denies any new issues with his feet but does have a lot of thickened toenails and wants to go see a podiatrist for this.  Anxiety and depression Patient is coming in for anxiety and depression and says that his anxiety and panic attacks and then more frequent since is been off of his medication. Previously he was on either Xanax or Klonopin. He says he was intolerant of all SSRIs and SNRIs. He is hesitant and resistant towards trying any, we discussed possibility of Cymbalta because it could help with his neuropathy. He is still very insistent on trying a benzodiazepine. He denies any suicidal  ideations or thoughts of hurting himself.  Weak urinary stream Patient has been having a weak urinary stream and some difficulty starting his stream. He has never had his prostate checked before and does not want to do a rectal exam today but would like to do the blood work for it. He denies any blood in his urine or fevers or chills or dysuria.  Relevant past medical, surgical, family and social history reviewed and updated as indicated. Interim medical history since our last visit reviewed. Allergies and medications reviewed and updated.  Review of Systems  Constitutional: Negative for chills and fever.  HENT: Positive for congestion.   Respiratory: Positive for cough, shortness of breath and wheezing.   Cardiovascular: Negative for chest pain and leg swelling.  Gastrointestinal: Negative for abdominal pain.  Genitourinary: Positive for frequency. Negative for decreased urine volume, dysuria, hematuria and urgency.  Musculoskeletal: Negative for back pain and gait problem.  Skin: Negative for rash.  Neurological: Positive for numbness. Negative for dizziness and weakness.  Psychiatric/Behavioral: Positive for decreased concentration, dysphoric mood and sleep disturbance. Negative for self-injury and suicidal ideas. The patient is nervous/anxious.   All other systems reviewed and are negative.  Per HPI unless specifically indicated above  Social History   Social History  . Marital status: Divorced  Spouse name: N/A  . Number of children: N/A  . Years of education: N/A   Occupational History  . Not on file.   Social History Main Topics  . Smoking status: Current Every Day Smoker    Packs/day: 1.50    Types: Cigarettes  . Smokeless tobacco: Never Used  . Alcohol use No     Comment: quit  . Drug use: No  . Sexual activity: No   Other Topics Concern  . Not on file   Social History Narrative  . No narrative on file    Past Surgical History:  Procedure Laterality Date   . CYST EXCISION      Family History  Problem Relation Age of Onset  . Heart disease Mother   . Heart disease Father   . Heart disease Maternal Grandmother   . Diabetes Maternal Grandmother     Allergies as of 11/11/2016      Reactions   Seroquel [quetiapine Fumarate] Anxiety, Other (See Comments)   Reaction:  Nightmares    Codeine Itching   Hydroxyzine Other (See Comments)   Pt unable to explain   Haldol [haloperidol Lactate] Anxiety, Other (See Comments)   Reaction:  Nightmares    Trazodone And Nefazodone Anxiety, Other (See Comments)   Reaction:  Nightmares       Medication List       Accurate as of 11/11/16 10:02 AM. Always use your most recent med list.          acetaminophen 650 MG suppository Commonly known as:  TYLENOL Place 1 suppository (650 mg total) rectally every 4 (four) hours as needed.   albuterol 108 (90 Base) MCG/ACT inhaler Commonly known as:  PROVENTIL HFA;VENTOLIN HFA Inhale 2 puffs into the lungs every 6 (six) hours as needed for wheezing or shortness of breath.   aspirin EC 81 MG tablet Take 1 tablet (81 mg total) by mouth daily. Reported on 01/02/2016   atorvastatin 40 MG tablet Commonly known as:  LIPITOR Take 1 tablet (40 mg total) by mouth daily. Reported on 01/02/2016   Blood Glucose Monitor System w/Device Kit 1 each by Does not apply route daily.   BLOOD GLUCOSE TEST STRIPS Strp 1 strip by In Vitro route 4 (four) times daily as needed.   cetirizine 10 MG tablet Commonly known as:  ZYRTEC Take 1 tablet (10 mg total) by mouth daily.   DULoxetine 60 MG capsule Commonly known as:  CYMBALTA Take 1 capsule (60 mg total) by mouth daily.   fluticasone 50 MCG/ACT nasal spray Commonly known as:  FLONASE PLACE TWO   SPRAYS INTO BOTH NOSTRILS DAILY.   Fluticasone-Salmeterol 100-50 MCG/DOSE Aepb Commonly known as:  ADVAIR Inhale 1 puff into the lungs 2 (two) times daily.   gabapentin 800 MG tablet Commonly known as:  NEURONTIN Take  800 mg by mouth 5 (five) times daily.   insulin aspart 100 UNIT/ML injection Commonly known as:  novoLOG Inject 15 Units into the skin 3 (three) times daily before meals. Reported on 01/02/2016   insulin glargine 100 UNIT/ML injection Commonly known as:  LANTUS Inject 0.3 mLs (30 Units total) into the skin daily at 10 pm.   Insulin Syringe-Needle U-100 31G X 5/16" 0.5 ML Misc Commonly known as:  B-D INS SYRINGE 0.5CC/31GX5/16 1 each by Does not apply route 3 (three) times daily.   isosorbide mononitrate 30 MG 24 hr tablet Commonly known as:  IMDUR Take 1 tablet (30 mg total) by mouth daily. Reported on  01/02/2016   lactulose 10 GM/15ML solution Commonly known as:  CHRONULAC Take 15 mLs (10 g total) by mouth 3 (three) times daily.   lisinopril 20 MG tablet Commonly known as:  PRINIVIL,ZESTRIL Take 20 mg by mouth daily.   metFORMIN 500 MG tablet Commonly known as:  GLUCOPHAGE Take 1,000 mg by mouth 2 (two) times daily with a meal.   metoprolol tartrate 25 MG tablet Commonly known as:  LOPRESSOR Take 1 tablet (25 mg total) by mouth 2 (two) times daily. Reported on 01/02/2016          Objective:    BP 104/65   Pulse 73   Temp (!) 96.8 F (36 C) (Oral)   Ht '5\' 11"'  (1.803 m)   Wt 224 lb 6.4 oz (101.8 kg)   BMI 31.30 kg/m   Wt Readings from Last 3 Encounters:  11/11/16 224 lb 6.4 oz (101.8 kg)  06/26/16 220 lb (99.8 kg)  01/02/16 226 lb (102.5 kg)    Physical Exam  Constitutional: He is oriented to person, place, and time. He appears well-developed and well-nourished. No distress.  Eyes: Conjunctivae are normal. Right eye exhibits no discharge. Left eye exhibits no discharge. No scleral icterus.  Neck: Neck supple. No thyromegaly present.  Cardiovascular: Normal rate, regular rhythm, normal heart sounds and intact distal pulses.   No murmur heard. Pulmonary/Chest: Effort normal. No respiratory distress. He has wheezes. He has no rales. He exhibits no tenderness.    Abdominal: Soft. Bowel sounds are normal. He exhibits no distension. There is no tenderness. There is no rebound.  Musculoskeletal: Normal range of motion. He exhibits no edema.  Lymphadenopathy:    He has no cervical adenopathy.  Neurological: He is alert and oriented to person, place, and time. Coordination normal.  Skin: Skin is warm and dry. No rash noted. He is not diaphoretic.  Psychiatric: His behavior is normal. Thought content normal. His mood appears anxious. He exhibits a depressed mood. He expresses no suicidal ideation. He expresses no suicidal plans.  Nursing note and vitals reviewed.   Results for orders placed or performed during the hospital encounter of 06/26/16  CBG monitoring, ED  Result Value Ref Range   Glucose-Capillary 112 (H) 65 - 99 mg/dL      Assessment & Plan:   Problem List Items Addressed This Visit      Respiratory   COPD (chronic obstructive pulmonary disease) (HCC) - Primary (Chronic)   Relevant Medications   Fluticasone-Salmeterol (ADVAIR) 100-50 MCG/DOSE AEPB   albuterol (PROVENTIL HFA;VENTOLIN HFA) 108 (90 Base) MCG/ACT inhaler     Endocrine   Diabetes mellitus type 2, controlled (HCC) (Chronic)   Relevant Medications   metFORMIN (GLUCOPHAGE) 500 MG tablet   lisinopril (PRINIVIL,ZESTRIL) 20 MG tablet   Other Relevant Orders   Ambulatory referral to Podiatry   Ambulatory referral to Ophthalmology   POCT glycosylated hemoglobin (Hb A1C)   CMP14+EGFR   Microalbumin / creatinine urine ratio   Lipid panel   Diabetic neuropathy (HCC)   Relevant Medications   metFORMIN (GLUCOPHAGE) 500 MG tablet   lisinopril (PRINIVIL,ZESTRIL) 20 MG tablet   Other Relevant Orders   Ambulatory referral to Podiatry   Ambulatory referral to Ophthalmology   POCT glycosylated hemoglobin (Hb A1C)   CMP14+EGFR   Microalbumin / creatinine urine ratio   Lipid panel     Other   Anxiety and depression   Relevant Orders   TSH   CBC with Differential/Platelet     Other Visit Diagnoses  Weak urinary stream       Relevant Orders   PSA, total and free       Follow up plan: Return in about 4 weeks (around 12/09/2016), or if symptoms worsen or fail to improve, for Recheck anxiety and depression and diabetes.  Caryl Pina, MD [facility] 11/11/2016, 10:02 AM

## 2016-11-12 ENCOUNTER — Telehealth: Payer: Self-pay | Admitting: Family Medicine

## 2016-11-12 LAB — CBC WITH DIFFERENTIAL/PLATELET
BASOS ABS: 0.1 10*3/uL (ref 0.0–0.2)
Basos: 1 %
EOS (ABSOLUTE): 0.5 10*3/uL — ABNORMAL HIGH (ref 0.0–0.4)
Eos: 6 %
Hematocrit: 40.4 % (ref 37.5–51.0)
Hemoglobin: 14.9 g/dL (ref 13.0–17.7)
Immature Grans (Abs): 0 10*3/uL (ref 0.0–0.1)
Immature Granulocytes: 1 %
LYMPHS: 22 %
Lymphocytes Absolute: 1.7 10*3/uL (ref 0.7–3.1)
MCH: 32.5 pg (ref 26.6–33.0)
MCHC: 36.9 g/dL — AB (ref 31.5–35.7)
MCV: 88 fL (ref 79–97)
MONOCYTES: 9 %
Monocytes Absolute: 0.7 10*3/uL (ref 0.1–0.9)
Neutrophils Absolute: 4.9 10*3/uL (ref 1.4–7.0)
Neutrophils: 61 %
PLATELETS: 188 10*3/uL (ref 150–379)
RBC: 4.58 x10E6/uL (ref 4.14–5.80)
RDW: 13.4 % (ref 12.3–15.4)
WBC: 7.9 10*3/uL (ref 3.4–10.8)

## 2016-11-12 LAB — CMP14+EGFR
A/G RATIO: 1.8 (ref 1.2–2.2)
ALK PHOS: 80 IU/L (ref 39–117)
ALT: 10 IU/L (ref 0–44)
AST: 11 IU/L (ref 0–40)
Albumin: 4.4 g/dL (ref 3.5–5.5)
BILIRUBIN TOTAL: 0.6 mg/dL (ref 0.0–1.2)
BUN/Creatinine Ratio: 8 — ABNORMAL LOW (ref 9–20)
BUN: 8 mg/dL (ref 6–24)
CHLORIDE: 95 mmol/L — AB (ref 96–106)
CO2: 17 mmol/L — ABNORMAL LOW (ref 18–29)
Calcium: 9.4 mg/dL (ref 8.7–10.2)
Creatinine, Ser: 1.06 mg/dL (ref 0.76–1.27)
GFR calc non Af Amer: 78 mL/min/{1.73_m2} (ref >59)
GFR, EST AFRICAN AMERICAN: 90 mL/min/{1.73_m2} (ref >59)
Globulin, Total: 2.4 g/dL (ref 1.5–4.5)
Glucose: 175 mg/dL — ABNORMAL HIGH (ref 65–99)
POTASSIUM: 4.6 mmol/L (ref 3.5–5.2)
Sodium: 129 mmol/L — ABNORMAL LOW (ref 134–144)
TOTAL PROTEIN: 6.8 g/dL (ref 6.0–8.5)

## 2016-11-12 LAB — PSA, TOTAL AND FREE
PROSTATE SPECIFIC AG, SERUM: 0.2 ng/mL (ref 0.0–4.0)
PSA FREE: 0.08 ng/mL
PSA, Free Pct: 40 %

## 2016-11-12 LAB — LIPID PANEL
CHOLESTEROL TOTAL: 184 mg/dL (ref 100–199)
Chol/HDL Ratio: 7.4 ratio units — ABNORMAL HIGH (ref 0.0–5.0)
HDL: 25 mg/dL — AB (ref >39)
LDL Calculated: 118 mg/dL — ABNORMAL HIGH (ref 0–99)
Triglycerides: 204 mg/dL — ABNORMAL HIGH (ref 0–149)
VLDL Cholesterol Cal: 41 mg/dL — ABNORMAL HIGH (ref 5–40)

## 2016-11-12 LAB — TSH: TSH: 1.34 u[IU]/mL (ref 0.450–4.500)

## 2016-11-12 MED ORDER — ACETAMINOPHEN ER 650 MG PO TBCR: 650.0000 mg | EXTENDED_RELEASE_TABLET | Freq: Three times a day (TID) | ORAL | 11 refills | Status: DC | PRN

## 2016-11-12 NOTE — Telephone Encounter (Signed)
Pt aware rx was sent into the pharmacy. °

## 2016-11-15 ENCOUNTER — Telehealth: Payer: Self-pay | Admitting: Family Medicine

## 2016-11-15 MED ORDER — EMPAGLIFLOZIN 10 MG PO TABS: 10.0000 mg | ORAL_TABLET | Freq: Every day | ORAL | 3 refills | Status: DC

## 2016-11-15 MED ORDER — SITAGLIPTIN PHOSPHATE 100 MG PO TABS: 100.0000 mg | ORAL_TABLET | Freq: Every day | ORAL | 2 refills | Status: DC

## 2016-11-15 NOTE — Telephone Encounter (Signed)
Per pt Insurance won't cover Jardiance Pt also requesting RX for anxiety Please advise

## 2016-11-15 NOTE — Telephone Encounter (Signed)
Pt aware of change in medication. Will check with pharmacy on cost of Januvia

## 2016-11-23 ENCOUNTER — Ambulatory Visit: Payer: Medicare Other | Admitting: Family Medicine

## 2016-11-23 IMAGING — CR DG CHEST 2V
2 series · 2 of 2 positions shown · non-contrast
Comparison: Prior study from 09/18/2015.

CLINICAL DATA: Initial valuation for acute wheezing.

EXAM:
CHEST  2 VIEW

[chest lat]
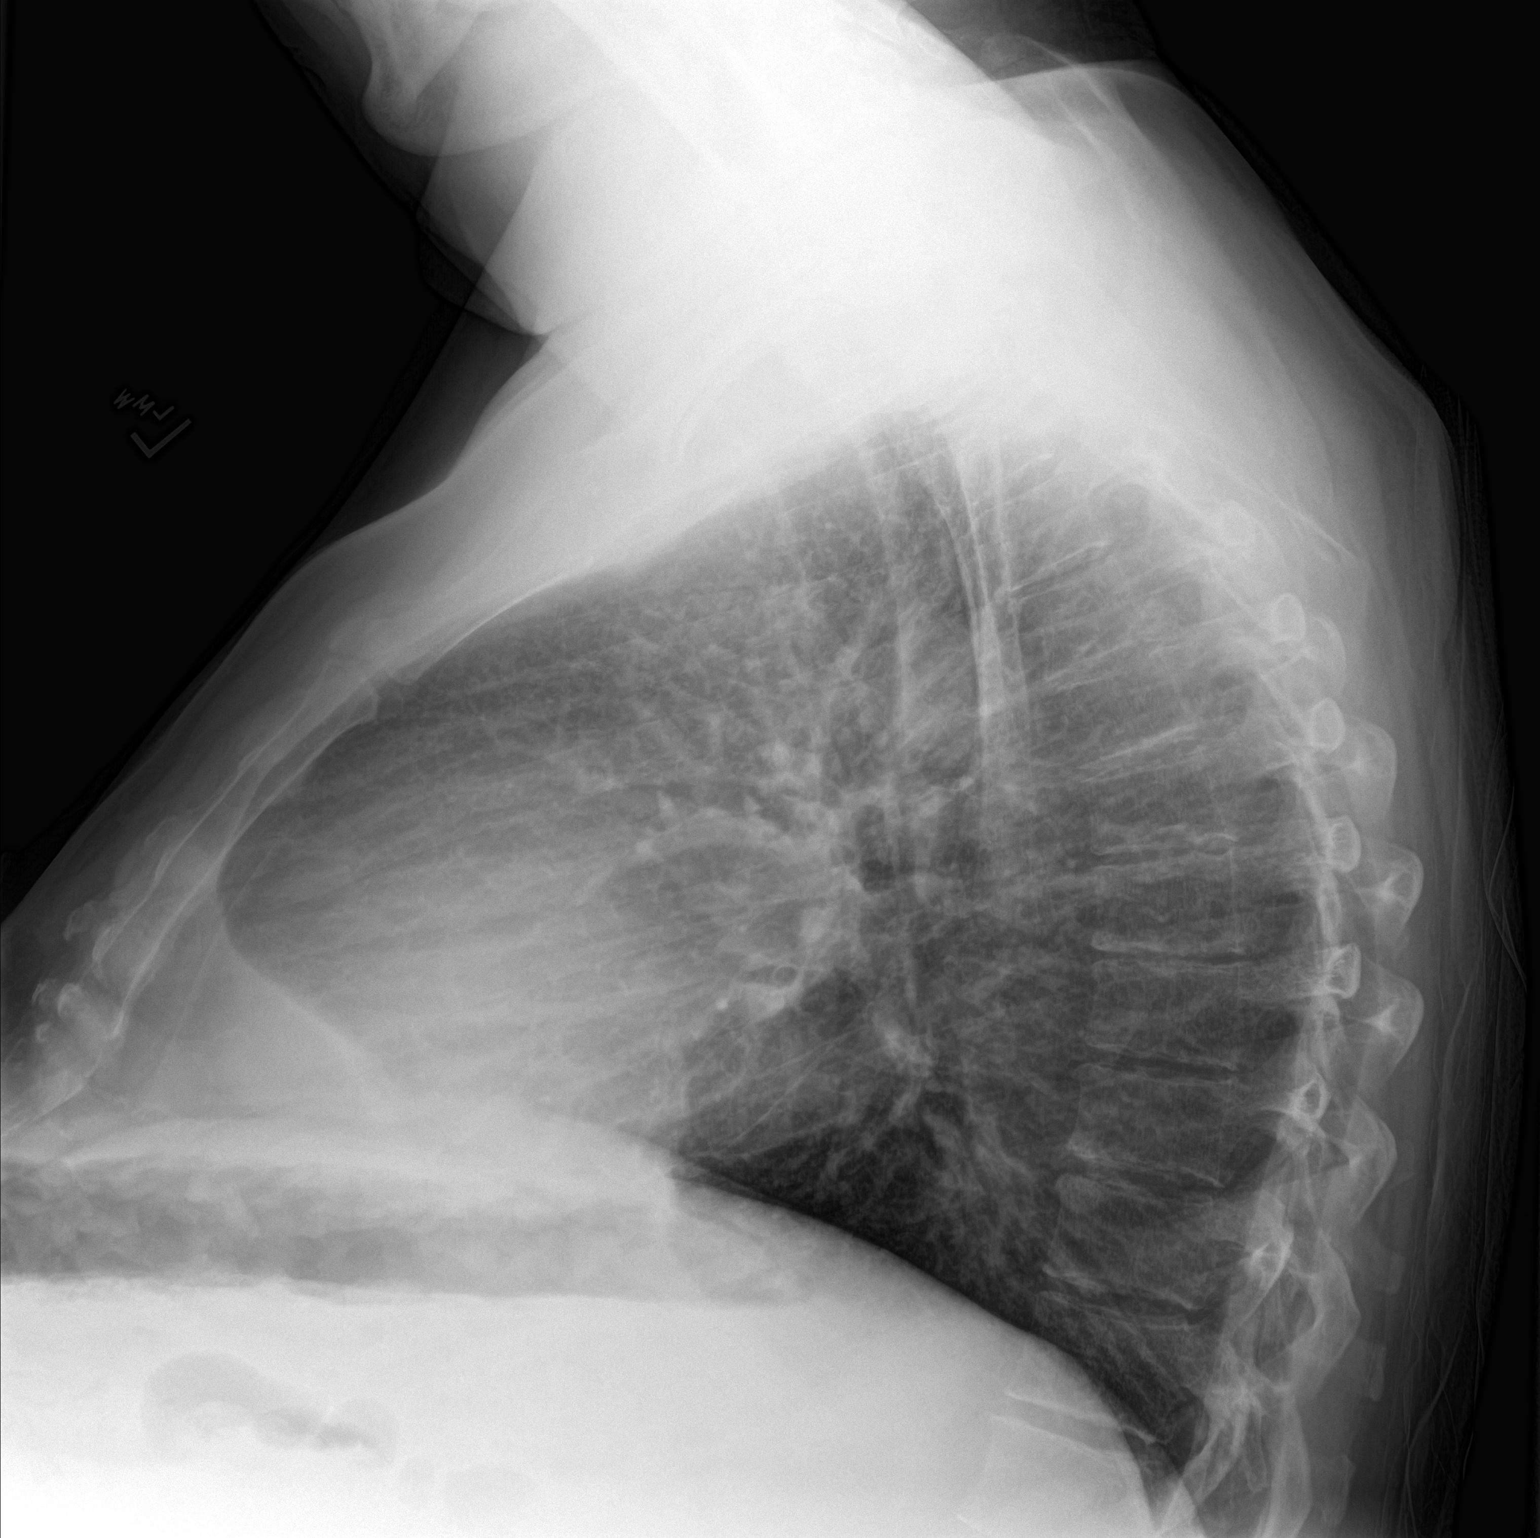

[chest ap]
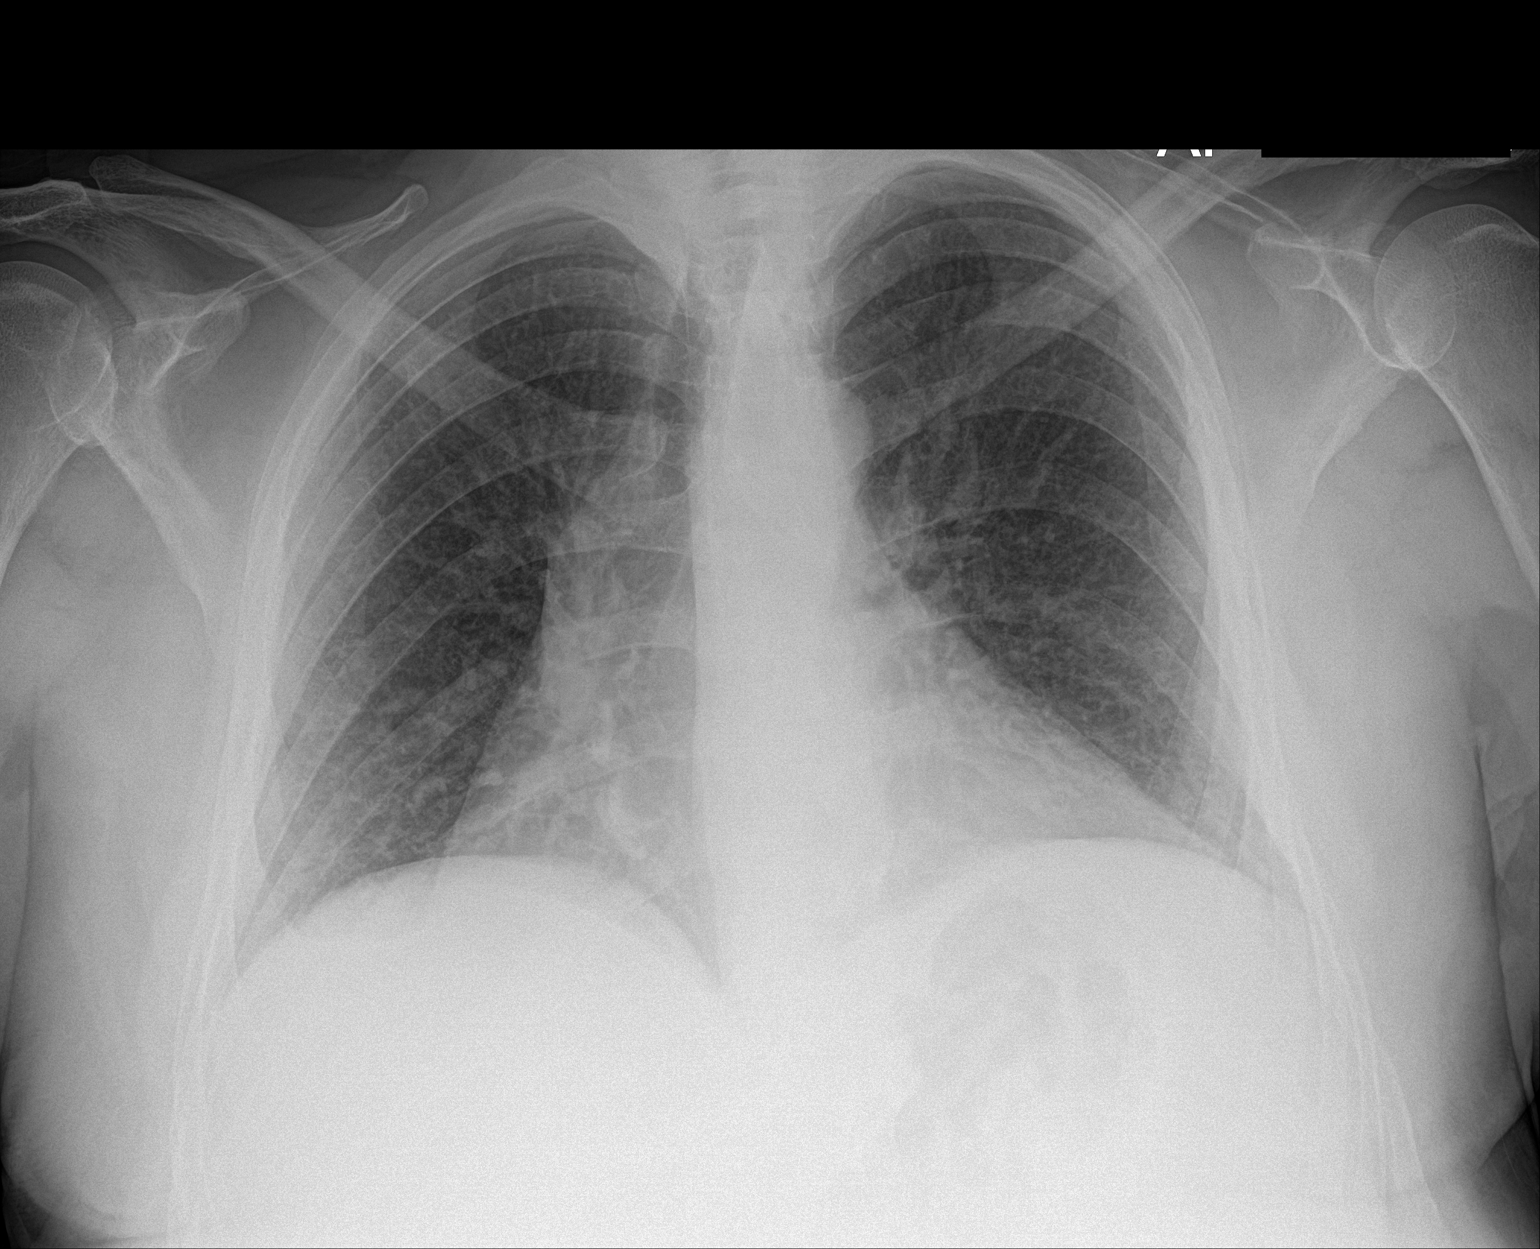

[2 of 2 positions shown; findings below may reference images not displayed]

FINDINGS: Allowing for differences in technique, cardiac and mediastinal
silhouettes are stable in size and contour, and remain within normal
limits.

Lungs are mildly hypoinflated. Diffuse peribronchial thickening is
present, may be related to underlying COPD and/or acute
bronchiolitis. No consolidative airspace disease. No pulmonary edema
or pleural effusion. No pneumothorax.

No acute osseus abnormality.
IMPRESSION: Diffuse peribronchial thickening, which may related to acute
bronchiolitis and/ or underlying chronic COPD. No consolidative
airspace opacity to suggest pneumonia.

## 2016-11-24 ENCOUNTER — Telehealth: Payer: Self-pay | Admitting: Family Medicine

## 2016-11-24 MED ORDER — OSELTAMIVIR PHOSPHATE 75 MG PO CAPS: 75.0000 mg | ORAL_CAPSULE | Freq: Two times a day (BID) | ORAL | 0 refills | Status: DC

## 2016-11-24 NOTE — Telephone Encounter (Signed)
Go ahead and call in Tamiflu 75 mg twice a day for 5 days.

## 2016-11-24 NOTE — Telephone Encounter (Signed)
Pt notified of RX 

## 2016-11-24 NOTE — Telephone Encounter (Signed)
Can we call in meds

## 2016-11-26 ENCOUNTER — Ambulatory Visit: Payer: Medicare Other | Admitting: Family Medicine

## 2016-11-29 ENCOUNTER — Telehealth: Payer: Self-pay | Admitting: Family Medicine

## 2016-11-29 NOTE — Telephone Encounter (Signed)
Patient called stating that he would like a refill on klonopin

## 2016-11-30 ENCOUNTER — Other Ambulatory Visit: Payer: Self-pay | Admitting: Family Medicine

## 2016-11-30 ENCOUNTER — Other Ambulatory Visit: Payer: Self-pay | Admitting: Pharmacist

## 2016-11-30 ENCOUNTER — Telehealth: Payer: Self-pay | Admitting: Family Medicine

## 2016-11-30 DIAGNOSIS — E1140 Type 2 diabetes mellitus with diabetic neuropathy, unspecified: Secondary | ICD-10-CM

## 2016-11-30 NOTE — Telephone Encounter (Signed)
Pt notified of RX for test strips Verbalizes understanding

## 2016-11-30 NOTE — Telephone Encounter (Signed)
Spoke with pt regarding sxs Pt has diarrhea No cough, congestion, fever or body aches Will call to schedule appt if sxs persist or worsen

## 2016-12-01 ENCOUNTER — Other Ambulatory Visit: Payer: Self-pay | Admitting: *Deleted

## 2016-12-01 NOTE — Patient Outreach (Signed)
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  PLAN Contact pt on 12/02/16  Jacqlyn Larsen [facility], [place] Coordinator [phone]

## 2016-12-02 ENCOUNTER — Other Ambulatory Visit: Payer: Self-pay | Admitting: *Deleted

## 2016-12-02 ENCOUNTER — Telehealth: Payer: Self-pay | Admitting: Family Medicine

## 2016-12-02 NOTE — Patient Outreach (Signed)
Telephone call to pt for screening, no answer to telephone and received message "voicemail has not been set up".  PLAN Contact pt beginning next week  Jacqlyn Larsen [facility], [place] Coordinator [phone]

## 2016-12-06 ENCOUNTER — Other Ambulatory Visit: Payer: Self-pay | Admitting: *Deleted

## 2016-12-06 ENCOUNTER — Telehealth: Payer: Self-pay | Admitting: *Deleted

## 2016-12-06 ENCOUNTER — Encounter: Payer: Self-pay | Admitting: *Deleted

## 2016-12-06 DIAGNOSIS — E119 Type 2 diabetes mellitus without complications: Secondary | ICD-10-CM | POA: Diagnosis not present

## 2016-12-06 DIAGNOSIS — I252 Old myocardial infarction: Secondary | ICD-10-CM | POA: Diagnosis not present

## 2016-12-06 DIAGNOSIS — R079 Chest pain, unspecified: Secondary | ICD-10-CM | POA: Diagnosis not present

## 2016-12-06 DIAGNOSIS — I10 Essential (primary) hypertension: Secondary | ICD-10-CM | POA: Diagnosis not present

## 2016-12-06 DIAGNOSIS — E871 Hypo-osmolality and hyponatremia: Secondary | ICD-10-CM | POA: Diagnosis not present

## 2016-12-06 DIAGNOSIS — F419 Anxiety disorder, unspecified: Secondary | ICD-10-CM | POA: Diagnosis not present

## 2016-12-06 DIAGNOSIS — J449 Chronic obstructive pulmonary disease, unspecified: Secondary | ICD-10-CM | POA: Diagnosis not present

## 2016-12-06 DIAGNOSIS — F17200 Nicotine dependence, unspecified, uncomplicated: Secondary | ICD-10-CM | POA: Diagnosis not present

## 2016-12-06 DIAGNOSIS — R0789 Other chest pain: Secondary | ICD-10-CM | POA: Diagnosis not present

## 2016-12-06 NOTE — Telephone Encounter (Signed)
I have already told the patient multiple times that I do not think he is a good candidate for it and will not prescribe it to him. I have offered other agents that could help him and if he wants to come in for an appointment to discuss some of those possibilities he can. I had prescribed him an antidepressant that he said he was not taking. That medicine would likely benefit him a lot more.

## 2016-12-06 NOTE — Telephone Encounter (Signed)
Per pt, very anxious due to mother's recent hospitalization Pt wants Klonopin appt offered Per pt, he can not come in for appt Please review and advise

## 2016-12-06 NOTE — Patient Outreach (Signed)
Telephone call to patient for screening/ assess for needs/ schedule home visit if needed, no answer to telephone and received message " number changed, disconnected or no longer in service"  RN CM mailed letter to patient's home requesting return phone call. RN CM called primary care provider (Dr. Warrick Parisian) office and spoke with nurse Jake Michaelis and reported unable to get in touch with pt, Zigmund Daniel does not have an alternate number, RN CM reported letter mailed to patient's home and RN CM will continue to contact pt.    PLAN Letter mailed today Attempt to contact pt at a later date  Jacqlyn Larsen [facility], [place] Coordinator [phone]

## 2016-12-07 ENCOUNTER — Telehealth: Payer: Self-pay | Admitting: Family Medicine

## 2016-12-07 ENCOUNTER — Ambulatory Visit: Payer: Medicare Other | Admitting: Family Medicine

## 2016-12-07 NOTE — Telephone Encounter (Signed)
Phone number out of order or disconnected jkp 2/20

## 2016-12-07 NOTE — Telephone Encounter (Signed)
Patient talked to nurse °

## 2016-12-08 ENCOUNTER — Encounter: Payer: Self-pay | Admitting: Family Medicine

## 2016-12-08 ENCOUNTER — Telehealth: Payer: Self-pay | Admitting: Family Medicine

## 2016-12-08 ENCOUNTER — Telehealth: Payer: Self-pay | Admitting: *Deleted

## 2016-12-08 NOTE — Telephone Encounter (Signed)
Patient aware and states he will call back to re schedule.

## 2016-12-08 NOTE — Telephone Encounter (Signed)
Call from pt stating he is having chest tightness, SOB, lightheaded Instructed pt to hang up and call 911 Pt does not want to go to ED Pt states he is very anxious Requesting medication Informed pt that no meds could be RXed without OV Reinforced to pt that if he is lightheaded with chest pain and SOB he needs to call 911 Pt verbalizes understanding

## 2016-12-08 NOTE — Telephone Encounter (Signed)
Thank you I concur

## 2016-12-09 NOTE — Telephone Encounter (Signed)
Phone out of order jkp 2/22

## 2016-12-10 DIAGNOSIS — F99 Mental disorder, not otherwise specified: Secondary | ICD-10-CM | POA: Diagnosis not present

## 2016-12-10 DIAGNOSIS — R0602 Shortness of breath: Secondary | ICD-10-CM | POA: Diagnosis not present

## 2016-12-10 DIAGNOSIS — E119 Type 2 diabetes mellitus without complications: Secondary | ICD-10-CM | POA: Diagnosis not present

## 2016-12-10 DIAGNOSIS — E871 Hypo-osmolality and hyponatremia: Secondary | ICD-10-CM | POA: Diagnosis not present

## 2016-12-10 DIAGNOSIS — F17200 Nicotine dependence, unspecified, uncomplicated: Secondary | ICD-10-CM | POA: Diagnosis not present

## 2016-12-10 DIAGNOSIS — M79605 Pain in left leg: Secondary | ICD-10-CM | POA: Diagnosis not present

## 2016-12-10 DIAGNOSIS — J449 Chronic obstructive pulmonary disease, unspecified: Secondary | ICD-10-CM | POA: Diagnosis not present

## 2016-12-10 DIAGNOSIS — E860 Dehydration: Secondary | ICD-10-CM | POA: Diagnosis not present

## 2016-12-10 DIAGNOSIS — F419 Anxiety disorder, unspecified: Secondary | ICD-10-CM | POA: Diagnosis not present

## 2016-12-10 DIAGNOSIS — Z79899 Other long term (current) drug therapy: Secondary | ICD-10-CM | POA: Diagnosis not present

## 2016-12-10 DIAGNOSIS — I10 Essential (primary) hypertension: Secondary | ICD-10-CM | POA: Diagnosis not present

## 2016-12-10 DIAGNOSIS — M791 Myalgia: Secondary | ICD-10-CM | POA: Diagnosis not present

## 2016-12-12 DIAGNOSIS — J449 Chronic obstructive pulmonary disease, unspecified: Secondary | ICD-10-CM | POA: Diagnosis not present

## 2016-12-12 DIAGNOSIS — G4489 Other headache syndrome: Secondary | ICD-10-CM | POA: Diagnosis not present

## 2016-12-12 DIAGNOSIS — R079 Chest pain, unspecified: Secondary | ICD-10-CM | POA: Diagnosis not present

## 2016-12-12 DIAGNOSIS — F17210 Nicotine dependence, cigarettes, uncomplicated: Secondary | ICD-10-CM | POA: Diagnosis not present

## 2016-12-12 DIAGNOSIS — E119 Type 2 diabetes mellitus without complications: Secondary | ICD-10-CM | POA: Diagnosis not present

## 2016-12-12 DIAGNOSIS — R0789 Other chest pain: Secondary | ICD-10-CM | POA: Diagnosis not present

## 2016-12-12 DIAGNOSIS — F419 Anxiety disorder, unspecified: Secondary | ICD-10-CM | POA: Diagnosis not present

## 2016-12-12 DIAGNOSIS — R0602 Shortness of breath: Secondary | ICD-10-CM | POA: Diagnosis not present

## 2016-12-13 ENCOUNTER — Other Ambulatory Visit: Payer: Self-pay | Admitting: Family Medicine

## 2016-12-13 ENCOUNTER — Telehealth: Payer: Self-pay | Admitting: Family Medicine

## 2016-12-13 ENCOUNTER — Other Ambulatory Visit: Payer: Self-pay | Admitting: *Deleted

## 2016-12-13 DIAGNOSIS — I10 Essential (primary) hypertension: Secondary | ICD-10-CM | POA: Diagnosis not present

## 2016-12-13 DIAGNOSIS — R079 Chest pain, unspecified: Secondary | ICD-10-CM | POA: Diagnosis not present

## 2016-12-13 DIAGNOSIS — E119 Type 2 diabetes mellitus without complications: Secondary | ICD-10-CM | POA: Diagnosis not present

## 2016-12-13 DIAGNOSIS — R0789 Other chest pain: Secondary | ICD-10-CM | POA: Diagnosis not present

## 2016-12-13 DIAGNOSIS — J449 Chronic obstructive pulmonary disease, unspecified: Secondary | ICD-10-CM | POA: Diagnosis not present

## 2016-12-13 DIAGNOSIS — F419 Anxiety disorder, unspecified: Secondary | ICD-10-CM | POA: Diagnosis not present

## 2016-12-13 DIAGNOSIS — G8929 Other chronic pain: Secondary | ICD-10-CM | POA: Diagnosis not present

## 2016-12-13 DIAGNOSIS — F17200 Nicotine dependence, unspecified, uncomplicated: Secondary | ICD-10-CM | POA: Diagnosis not present

## 2016-12-13 DIAGNOSIS — M7072 Other bursitis of hip, left hip: Secondary | ICD-10-CM | POA: Diagnosis not present

## 2016-12-13 DIAGNOSIS — R51 Headache: Secondary | ICD-10-CM | POA: Diagnosis not present

## 2016-12-13 MED ORDER — GABAPENTIN 800 MG PO TABS: 800.0000 mg | ORAL_TABLET | Freq: Every day | ORAL | 0 refills | Status: DC

## 2016-12-13 MED ORDER — LISINOPRIL 20 MG PO TABS: 20.0000 mg | ORAL_TABLET | Freq: Every day | ORAL | 0 refills | Status: DC

## 2016-12-13 NOTE — Telephone Encounter (Signed)
Pt informed that he will need to be seen here first for pain management referral Pt also informed that he has refills on meds at [facility] declined appt Will call back to schedule

## 2016-12-13 NOTE — Telephone Encounter (Signed)
Pt wants new rx for muscle relaxer for pain behind leg and back sent to [facility]

## 2016-12-13 NOTE — Patient Outreach (Signed)
Telephone call to patient- 3rd attempt- for screeing, no answer to telephone and received message stating "number changed, disconnected, no longer in service"  Letter has been previously mailed, will await call back from pt.  Jacqlyn Larsen [facility], [place] Coordinator [phone]

## 2016-12-13 NOTE — Telephone Encounter (Signed)
What type of referral do you need? Pain management  Have you been seen at our office for this problem? Pt told Dettinger about it (If no, schedule them an appointment.  They will need to be seen before a referral can be done.)  Is there a particular doctor or location that you prefer? [place]  Patient notified that referrals can take up to a week or longer to process. If they haven't heard anything within a week they should call back and speak with the referral department.

## 2016-12-13 NOTE — Telephone Encounter (Signed)
I am okay with sending these refills and give him 3 months worth, tell him that he needs to come back for a visit before those 3 months are up or we will not be able to refill them any further

## 2016-12-14 ENCOUNTER — Telehealth: Payer: Self-pay | Admitting: Family Medicine

## 2016-12-14 ENCOUNTER — Telehealth: Payer: Self-pay | Admitting: *Deleted

## 2016-12-14 MED ORDER — CYCLOBENZAPRINE HCL 5 MG PO TABS: 5.0000 mg | ORAL_TABLET | Freq: Three times a day (TID) | ORAL | 1 refills | Status: DC | PRN

## 2016-12-14 NOTE — Telephone Encounter (Signed)
Pt notified he MUST BE SEEN for medications for pain or muscle relaxers

## 2016-12-14 NOTE — Telephone Encounter (Signed)
lmtcb jkp 2/27

## 2016-12-14 NOTE — Telephone Encounter (Signed)
Flexeril Prescription sent to pharmacy, may make pt sleepy

## 2016-12-14 NOTE — Telephone Encounter (Signed)
Patient is having pain in his lower back and thighs, has been ongoing problem that flares up sporadically.  Taking Advil at home now.  Would like to know if we can send in a muscle relaxer to [facility].  Please advise.

## 2016-12-14 NOTE — Telephone Encounter (Signed)
Patient aware.

## 2016-12-16 ENCOUNTER — Telehealth: Payer: Self-pay | Admitting: Family Medicine

## 2016-12-16 NOTE — Telephone Encounter (Signed)
Pt notified he must be seen for additional medications

## 2016-12-16 NOTE — Telephone Encounter (Signed)
Patient needs to come in for a visit if he wants to discuss anything further

## 2016-12-17 ENCOUNTER — Other Ambulatory Visit: Payer: Self-pay | Admitting: Family Medicine

## 2016-12-17 DIAGNOSIS — Z794 Long term (current) use of insulin: Principal | ICD-10-CM

## 2016-12-17 DIAGNOSIS — E119 Type 2 diabetes mellitus without complications: Secondary | ICD-10-CM

## 2016-12-20 ENCOUNTER — Encounter: Payer: Self-pay | Admitting: *Deleted

## 2016-12-20 ENCOUNTER — Telehealth: Payer: Self-pay | Admitting: Family Medicine

## 2016-12-20 ENCOUNTER — Other Ambulatory Visit: Payer: Self-pay | Admitting: *Deleted

## 2016-12-20 NOTE — Telephone Encounter (Signed)
Reiterated to pt he must be seen for any new medications

## 2016-12-20 NOTE — Patient Outreach (Signed)
RN CM unable to maintain contact with pt.  Pt did not respond to letter mailed to his home.  RN CM faxed letter to primary MD Dr. Warrick Parisian informing of case closure, mailed case closure letter to patient's home.  Jacqlyn Larsen [facility], [place] Coordinator [phone]

## 2016-12-22 DIAGNOSIS — R0789 Other chest pain: Secondary | ICD-10-CM | POA: Diagnosis not present

## 2016-12-22 DIAGNOSIS — I10 Essential (primary) hypertension: Secondary | ICD-10-CM | POA: Diagnosis not present

## 2016-12-22 DIAGNOSIS — I482 Chronic atrial fibrillation: Secondary | ICD-10-CM | POA: Diagnosis not present

## 2016-12-22 DIAGNOSIS — F419 Anxiety disorder, unspecified: Secondary | ICD-10-CM | POA: Diagnosis not present

## 2016-12-22 DIAGNOSIS — F459 Somatoform disorder, unspecified: Secondary | ICD-10-CM | POA: Diagnosis not present

## 2016-12-22 DIAGNOSIS — F450 Somatization disorder: Secondary | ICD-10-CM | POA: Diagnosis not present

## 2016-12-22 DIAGNOSIS — F17200 Nicotine dependence, unspecified, uncomplicated: Secondary | ICD-10-CM | POA: Diagnosis not present

## 2016-12-22 DIAGNOSIS — J449 Chronic obstructive pulmonary disease, unspecified: Secondary | ICD-10-CM | POA: Diagnosis not present

## 2016-12-22 DIAGNOSIS — E119 Type 2 diabetes mellitus without complications: Secondary | ICD-10-CM | POA: Diagnosis not present

## 2016-12-23 ENCOUNTER — Encounter: Payer: Self-pay | Admitting: Family Medicine

## 2016-12-23 ENCOUNTER — Other Ambulatory Visit: Payer: Self-pay | Admitting: *Deleted

## 2016-12-23 ENCOUNTER — Telehealth: Payer: Self-pay | Admitting: Family Medicine

## 2016-12-23 ENCOUNTER — Other Ambulatory Visit: Payer: Self-pay | Admitting: Family Medicine

## 2016-12-23 ENCOUNTER — Encounter (HOSPITAL_COMMUNITY): Payer: Self-pay | Admitting: Cardiology

## 2016-12-23 ENCOUNTER — Telehealth: Payer: Self-pay | Admitting: *Deleted

## 2016-12-23 ENCOUNTER — Emergency Department (HOSPITAL_COMMUNITY)
Admission: EM | Admit: 2016-12-23 | Discharge: 2016-12-24 | Disposition: A | Discharge status: Other Acute Inpatient Hospital | Payer: Medicare Other | Attending: Emergency Medicine | Admitting: Emergency Medicine

## 2016-12-23 DIAGNOSIS — J449 Chronic obstructive pulmonary disease, unspecified: Secondary | ICD-10-CM | POA: Diagnosis not present

## 2016-12-23 DIAGNOSIS — Z7982 Long term (current) use of aspirin: Secondary | ICD-10-CM | POA: Diagnosis not present

## 2016-12-23 DIAGNOSIS — E871 Hypo-osmolality and hyponatremia: Secondary | ICD-10-CM

## 2016-12-23 DIAGNOSIS — I10 Essential (primary) hypertension: Secondary | ICD-10-CM | POA: Diagnosis not present

## 2016-12-23 DIAGNOSIS — T461X2A Poisoning by calcium-channel blockers, intentional self-harm, initial encounter: Secondary | ICD-10-CM | POA: Diagnosis not present

## 2016-12-23 DIAGNOSIS — T464X2A Poisoning by angiotensin-converting-enzyme inhibitors, intentional self-harm, initial encounter: Secondary | ICD-10-CM | POA: Diagnosis not present

## 2016-12-23 DIAGNOSIS — T447X2A Poisoning by beta-adrenoreceptor antagonists, intentional self-harm, initial encounter: Secondary | ICD-10-CM | POA: Insufficient documentation

## 2016-12-23 DIAGNOSIS — F209 Schizophrenia, unspecified: Secondary | ICD-10-CM

## 2016-12-23 DIAGNOSIS — Z794 Long term (current) use of insulin: Secondary | ICD-10-CM | POA: Diagnosis not present

## 2016-12-23 DIAGNOSIS — T50902A Poisoning by unspecified drugs, medicaments and biological substances, intentional self-harm, initial encounter: Secondary | ICD-10-CM

## 2016-12-23 DIAGNOSIS — F17210 Nicotine dependence, cigarettes, uncomplicated: Secondary | ICD-10-CM | POA: Insufficient documentation

## 2016-12-23 DIAGNOSIS — Z79899 Other long term (current) drug therapy: Secondary | ICD-10-CM | POA: Insufficient documentation

## 2016-12-23 DIAGNOSIS — E119 Type 2 diabetes mellitus without complications: Secondary | ICD-10-CM | POA: Diagnosis not present

## 2016-12-23 LAB — COMPREHENSIVE METABOLIC PANEL
ALBUMIN: 4.4 g/dL (ref 3.5–5.0)
ALT: 13 U/L — ABNORMAL LOW (ref 17–63)
ANION GAP: 9 (ref 5–15)
AST: 15 U/L (ref 15–41)
Alkaline Phosphatase: 56 U/L (ref 38–126)
BILIRUBIN TOTAL: 0.9 mg/dL (ref 0.3–1.2)
BUN: 18 mg/dL (ref 6–20)
CO2: 21 mmol/L — AB (ref 22–32)
Calcium: 9.6 mg/dL (ref 8.9–10.3)
Chloride: 96 mmol/L — ABNORMAL LOW (ref 101–111)
Creatinine, Ser: 1.8 mg/dL — ABNORMAL HIGH (ref 0.61–1.24)
GFR calc Af Amer: 47 mL/min — ABNORMAL LOW (ref >60)
GFR calc non Af Amer: 40 mL/min — ABNORMAL LOW (ref >60)
GLUCOSE: 120 mg/dL — AB (ref 65–99)
POTASSIUM: 4.2 mmol/L (ref 3.5–5.1)
SODIUM: 126 mmol/L — AB (ref 135–145)
TOTAL PROTEIN: 7.1 g/dL (ref 6.5–8.1)

## 2016-12-23 LAB — CBC WITH DIFFERENTIAL/PLATELET
BASOS PCT: 1 %
Basophils Absolute: 0.1 10*3/uL (ref 0.0–0.1)
Eosinophils Absolute: 0.2 10*3/uL (ref 0.0–0.7)
Eosinophils Relative: 4 %
HEMATOCRIT: 43.8 % (ref 39.0–52.0)
HEMOGLOBIN: 16.3 g/dL (ref 13.0–17.0)
LYMPHS ABS: 1.9 10*3/uL (ref 0.7–4.0)
LYMPHS PCT: 31 %
MCH: 33.2 pg (ref 26.0–34.0)
MCHC: 37.2 g/dL — AB (ref 30.0–36.0)
MCV: 89.2 fL (ref 78.0–100.0)
MONO ABS: 0.6 10*3/uL (ref 0.1–1.0)
MONOS PCT: 9 %
NEUTROS ABS: 3.6 10*3/uL (ref 1.7–7.7)
NEUTROS PCT: 56 %
Platelets: 158 10*3/uL (ref 150–400)
RBC: 4.91 MIL/uL (ref 4.22–5.81)
RDW: 13.5 % (ref 11.5–15.5)
WBC: 6.3 10*3/uL (ref 4.0–10.5)

## 2016-12-23 LAB — CBG MONITORING, ED: Glucose-Capillary: 126 mg/dL — ABNORMAL HIGH (ref 65–99)

## 2016-12-23 LAB — SALICYLATE LEVEL

## 2016-12-23 LAB — ETHANOL: Alcohol, Ethyl (B): <5 mg/dL (ref <5)

## 2016-12-23 LAB — ACETAMINOPHEN LEVEL: Acetaminophen (Tylenol), Serum: <10 ug/mL — ABNORMAL LOW (ref 10–30)

## 2016-12-23 MED ORDER — CLONAZEPAM 0.5 MG PO TABS
1.0000 mg | ORAL_TABLET | Freq: Two times a day (BID) | ORAL | Status: DC
  Administered 2016-12-24: 1 mg via ORAL
  Filled 2016-12-23: qty 2

## 2016-12-23 MED ORDER — GABAPENTIN 400 MG PO CAPS
800.0000 mg | ORAL_CAPSULE | Freq: Every day | ORAL | Status: DC
  Administered 2016-12-24 (×2): 800 mg via ORAL
  Filled 2016-12-23 (×2): qty 2

## 2016-12-23 MED ORDER — NICOTINE 21 MG/24HR TD PT24
21.0000 mg | MEDICATED_PATCH | Freq: Every day | TRANSDERMAL | Status: DC
  Administered 2016-12-23: 21 mg via TRANSDERMAL
  Filled 2016-12-23 (×2): qty 1

## 2016-12-23 MED ORDER — ACETAMINOPHEN 325 MG PO TABS: 650.0000 mg | ORAL_TABLET | Freq: Three times a day (TID) | ORAL | Status: DC | PRN

## 2016-12-23 MED ORDER — MOMETASONE FURO-FORMOTEROL FUM 100-5 MCG/ACT IN AERO
2.0000 | INHALATION_SPRAY | Freq: Two times a day (BID) | RESPIRATORY_TRACT | Status: DC
  Administered 2016-12-24: 2 via RESPIRATORY_TRACT
  Filled 2016-12-23: qty 8.8

## 2016-12-23 MED ORDER — LINAGLIPTIN 5 MG PO TABS
5.0000 mg | ORAL_TABLET | Freq: Every day | ORAL | Status: DC
  Filled 2016-12-23 (×4): qty 1

## 2016-12-23 MED ORDER — ISOSORBIDE MONONITRATE ER 30 MG PO TB24
30.0000 mg | ORAL_TABLET | Freq: Every day | ORAL | Status: DC
Start: 2016-12-24 — End: 2016-12-24

## 2016-12-23 MED ORDER — ASPIRIN EC 81 MG PO TBEC
81.0000 mg | DELAYED_RELEASE_TABLET | Freq: Every day | ORAL | Status: DC
  Administered 2016-12-24: 81 mg via ORAL
  Filled 2016-12-23: qty 1

## 2016-12-23 MED ORDER — DULOXETINE HCL 30 MG PO CPEP
60.0000 mg | ORAL_CAPSULE | Freq: Every day | ORAL | Status: DC
  Filled 2016-12-23: qty 2

## 2016-12-23 MED ORDER — "INSULIN SYRINGE-NEEDLE U-100 31G X 5/16"" 0.5 ML MISC": 1.0000 | Freq: Four times a day (QID) | Status: DC

## 2016-12-23 MED ORDER — LORATADINE 10 MG PO TABS
10.0000 mg | ORAL_TABLET | Freq: Every day | ORAL | Status: DC
  Administered 2016-12-24: 10 mg via ORAL
  Filled 2016-12-23: qty 1

## 2016-12-23 MED ORDER — ALBUTEROL SULFATE HFA 108 (90 BASE) MCG/ACT IN AERS: 2.0000 | INHALATION_SPRAY | Freq: Four times a day (QID) | RESPIRATORY_TRACT | Status: DC | PRN

## 2016-12-23 MED ORDER — METFORMIN HCL ER 500 MG PO TB24: 500.0000 mg | ORAL_TABLET | Freq: Two times a day (BID) | ORAL | Status: DC

## 2016-12-23 NOTE — ED Triage Notes (Signed)
Pt's mother recently was placed in a nursing home.  Mother is his primary caregiver.  Pt not taking medicines properly.  Expressed feeling of SI to EMS.

## 2016-12-23 NOTE — Patient Outreach (Signed)
Pt called 24 hour nurse line on 12/23/16 and reported he has anxiety over his mother passing away, pt instructed to see primary MD wihin 1-3 days.   RN CM called Dr. Merita Norton office and spoke with Jake Michaelis nurse- after reading notes in chart and confirmed pt has been discharged from their practice as of yesterday for refusing to come in and see MD and getting angry and hanging up the phone, letter has been mailed to pt home and MD will follow pt for 30 more days. RN CM closed this case recently due to unable to maintain contact with pt.  RN CM called pt for follow up and pt states " I didn't tell anyone my mother died, don't know what you're talking about" Pt states he has no money, no food, is being evicted soon, does not know where his money goes, states " someone in [place] looks after my money, I don't even know who"  Pt states he has no transportation and cannot ride [place] because he owes them money, cannot get to MD office and wants to switch primary MD providers to someone new in [place], states all his family and friends "have disowned me"   Pt states he has schizophrenia and sometimes hears gunshot sounds in his head and hides in the bathroom.  Pt rambles and switches from one subject to another and is unable to answer some questions.  Pt states " I never check my blood sugar, I can't remember anything". Pt reports he has ongoing anxiety. RN CM called Dr Dettinger's office and spoke with Zigmund Daniel nurse and reported the above information and that referral will be made to [place] and agreed upon with Zigmund Daniel that RN CM will ask [employer] Department to do a welfare check on pt today since pt states he sometimes hears things and hides in the bathroom and pt reports he is at home with no resources, no transportation, no money.  Zigmund Daniel states MD office has purchased medications for pt and tried to help pt with his needs and MD needs to see pt in the office, also agreed upon RN CM will call Adult  Protective Services. Primary MD Dr. Warrick Parisian has agreed to see patient for an appointment if [employer] can assist pt with transportation and get pt to the appointment.  RN CM called [facility] CSW [place] Nan and reported all of the above and that referral will be sent, reported not sure if environment is safe for a home visit, reported pt needs assistance with transportation and pt states he owes [employer] transportation money and not allowed to ride the [place], ask CSW to check into this.  Reported RN CM will do APS referral and have police do a safety/ welfare check today. RN CM called [employer] and ask they do welfare/ safety check on pt today, per dispatcher, office is on the way to see pt. Angie from [employer] called back and states police checked in on pt and everything seems okay, states " patient is fine" and that pt reported he has no plans for self harm and no other issues identified. RN CM called [facility] [employer], spoke with Netta Cedars and reported all of the above information, she took report and left emails to proper channels for mental health and ask RN CM to call back to [place] at [phone]  Extension [phone] for follow up.  If APS unable to provide assistance, Rip Harbour states another resource may be [employer]  [facility] if deemed necessary.  PLAN Follow up with Ivin Booty at [place] with [place] [facility], [place] Coordinator [phone]
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
## 2016-12-23 NOTE — Telephone Encounter (Signed)
Pt called requesting placement in nursing home or group home Per Dr Warrick Parisian, pt must be seen Informed pt of this He became very angry and hung up Per Dr Warrick Parisian, pt will be dismissed from prctice for non-compliance

## 2016-12-23 NOTE — ED Provider Notes (Signed)
[facility] DEPT Provider Note   CSN: [id number] Arrival date & time: 12/23/16  1836     History   Chief Complaint Chief Complaint  Patient presents with  . V70.1    HPI [name] is a 56 y.o. male. Patient presents today for suicidal ideations and claims to have taken entire bottle of metoprolol and 7-8 lisinopril tablets (patient has 6 tablets of lisinopril remaining from 30 tablet bottle that was prescribed 6 days ago) a few hours ago. He states he took the pills because he wanted to hurt himself and get rid of the feeling of "anxiety" he has in his chest after his mother left. Patient's primary caretaker was his mother, but she was admitted to nursing home in Feb. Since then, he has been in charge of his own medications. He is afraid that someone is out to get him and afraid that he is going to die. Denies AH, VH, homicidal ideations, alcohol use, illicit drug use or recent trauma/injury.  HPI  Past Medical History:  Diagnosis Date  . Alcohol dependence ([place]) 1979   stated abusing ETOH at age 27   . Benzodiazepine dependence ([place])   . Cardiac arrest ([place])   . Chronic back pain   . Cocaine abuse   . COPD (chronic obstructive pulmonary disease) ([place])   . Diabetes mellitus without complication ([place]) 5681  . Hypertension 2008  . MI (myocardial infarction)   . Non-compliance   . Schizoaffective disorder ([place]) 2006   . Substance abuse     Patient Active Problem List   Diagnosis Date Noted  . Diabetic neuropathy ([place]) 11/11/2016  . Anxiety and depression 11/11/2016  . Chronic insomnia 01/08/2016  . Alcohol use disorder ([place]) 12/10/2015  . Benzodiazepine abuse 12/10/2015  . Opioid abuse 12/10/2015  . Constipation 02/06/2015  . BPH (benign prostatic hyperplasia) 01/16/2015  . Vitamin D deficiency 01/16/2015  . Allergic rhinitis 11/19/2014  . Chronic low back pain 11/07/2014  . Onychomycosis of toenail 11/07/2014  . Tinea pedis 11/07/2014  . Psoriasis 10/14/2014    . HTN (hypertension) 10/14/2014  . Diabetes mellitus type 2, controlled ([place]) 10/14/2014  . COPD (chronic obstructive pulmonary disease) ([place]) 10/14/2014  . Tobacco abuse 10/14/2014  . HLD (hyperlipidemia) 10/14/2014  . Schizoaffective disorder, depressive type ([place]) 10/14/2014    Past Surgical History:  Procedure Laterality Date  . CYST EXCISION         Home Medications    Prior to Admission medications   Medication Sig Start Date End Date Taking? Authorizing Provider  acetaminophen (TYLENOL 8 HOUR) 650 MG CR tablet Take 1 tablet (650 mg total) by mouth every 8 (eight) hours as needed for pain. 11/12/16  Yes Fransisca Kaufmann Dettinger, MD  albuterol (PROVENTIL HFA;VENTOLIN HFA) 108 (90 Base) MCG/ACT inhaler Inhale 2 puffs into the lungs every 6 (six) hours as needed for wheezing or shortness of breath. 11/11/16  Yes Fransisca Kaufmann Dettinger, MD  atorvastatin (LIPITOR) 40 MG tablet TAKE 1 TABLET BY MOUTH EVERY DAY - EMERGENCY REFILL FAXED DR 12/17/16  Yes Fransisca Kaufmann Dettinger, MD  clonazePAM (KLONOPIN) 1 MG tablet Take 1 mg by mouth 2 (two) times daily.   Yes Historical Provider, MD  cyclobenzaprine (FLEXERIL) 5 MG tablet Take 1 tablet (5 mg total) by mouth 3 (three) times daily as needed for muscle spasms. 12/14/16  Yes Sharion Balloon, FNP  DULoxetine (CYMBALTA) 60 MG capsule Take 1 capsule (60 mg total) by mouth daily. 11/11/16  Yes Fransisca Kaufmann Dettinger,  MD  Fluticasone-Salmeterol (ADVAIR) 100-50 MCG/DOSE AEPB Inhale 1 puff into the lungs 2 (two) times daily. 11/11/16  Yes Fransisca Kaufmann Dettinger, MD  gabapentin (NEURONTIN) 800 MG tablet Take 1 tablet (800 mg total) by mouth 5 (five) times daily. 12/13/16  Yes Fransisca Kaufmann Dettinger, MD  hydrOXYzine (VISTARIL) 25 MG capsule Take 25 mg by mouth 3 (three) times daily as needed.   Yes Historical Provider, MD  isosorbide mononitrate (IMDUR) 30 MG 24 hr tablet Take 1 tablet (30 mg total) by mouth daily. Reported on 01/02/2016 02/20/16  Yes Josalyn Funches, MD  lisinopril  (PRINIVIL,ZESTRIL) 20 MG tablet Take 1 tablet (20 mg total) by mouth daily. 12/13/16  Yes Fransisca Kaufmann Dettinger, MD  metFORMIN (GLUCOPHAGE-XR) 500 MG 24 hr tablet Take 500 mg by mouth 2 (two) times daily.   Yes Historical Provider, MD  metoprolol tartrate (LOPRESSOR) 25 MG tablet Take 1 tablet (25 mg total) by mouth 2 (two) times daily. Reported on 01/02/2016 02/20/16  Yes Josalyn Funches, MD  sitaGLIPtin (JANUVIA) 100 MG tablet Take 1 tablet (100 mg total) by mouth daily. 11/15/16  Yes Fransisca Kaufmann Dettinger, MD  aspirin EC 81 MG tablet Take 1 tablet (81 mg total) by mouth daily. Reported on 01/02/2016 02/16/16   Boykin Nearing, MD  Blood Glucose Monitoring Suppl (BLOOD GLUCOSE MONITOR SYSTEM) w/Device KIT 1 each by Does not apply route daily. 11/11/16   Fransisca Kaufmann Dettinger, MD  cetirizine (ZYRTEC) 10 MG tablet Take 1 tablet (10 mg total) by mouth daily. 02/20/16   Josalyn Funches, MD  fluticasone (FLONASE) 50 MCG/ACT nasal spray PLACE TWO   SPRAYS INTO BOTH NOSTRILS DAILY. 02/20/16   Josalyn Funches, MD  Glucose Blood (BLOOD GLUCOSE TEST STRIPS) STRP 1 strip by In Vitro route 4 (four) times daily as needed. 11/11/16   Fransisca Kaufmann Dettinger, MD  Insulin Syringe-Needle U-100 (B-D INS SYRINGE 0.5CC/31GX5/16) 31G X 5/16" 0.5 ML MISC 1 each by Does not apply route 3 (three) times daily. Patient taking differently: 1 each by Does not apply route 4 (four) times daily.  03/18/16   Boykin Nearing, MD  oseltamivir (TAMIFLU) 75 MG capsule Take 1 capsule (75 mg total) by mouth 2 (two) times daily. Patient not taking: Reported on 12/23/2016 11/24/16   Fransisca Kaufmann Dettinger, MD    Family History Family History  Problem Relation Age of Onset  . Heart disease Mother   . Heart disease Father   . Heart disease Maternal Grandmother   . Diabetes Maternal Grandmother     Social History Social History  Substance Use Topics  . Smoking status: Current Every Day Smoker    Packs/day: 1.50    Types: Cigarettes  . Smokeless tobacco: Never Used   . Alcohol use No     Comment: quit     Allergies   Seroquel [quetiapine fumarate]; Codeine; Hydroxyzine; Haldol [haloperidol lactate]; and Trazodone and nefazodone   Review of Systems Review of Systems  Constitutional: Negative for fever.  HENT: Negative for congestion and rhinorrhea.   Eyes: Negative for visual disturbance.  Respiratory: Negative for chest tightness and shortness of breath.   Cardiovascular: Negative for chest pain and palpitations.  Gastrointestinal: Positive for abdominal distention and diarrhea. Negative for nausea and vomiting.  Endocrine: Negative for polyuria.  Genitourinary: Positive for decreased urine volume. Negative for dysuria and frequency.  Skin: Negative for rash.  Neurological: Negative for light-headedness.  Psychiatric/Behavioral: Positive for confusion and suicidal ideas. The patient is nervous/anxious.      Physical  Exam Updated Vital Signs BP 131/68 (BP Location: Left Arm)   Pulse 85   Temp 98 F (36.7 C) (Oral)   Resp 18   SpO2 100%   Physical Exam  Constitutional: He appears well-developed and well-nourished.  HENT:  Head: Normocephalic and atraumatic.  Eyes: Conjunctivae and EOM are normal.  Neck: Normal range of motion.  Cardiovascular: Normal rate, regular rhythm and normal heart sounds.   Pulmonary/Chest: Effort normal and breath sounds normal.  Abdominal: Bowel sounds are normal. He exhibits distension. There is no tenderness. There is no rebound.  Musculoskeletal: He exhibits no edema.  Neurological: He is alert. No cranial nerve deficit. Coordination normal.  Skin: No rash noted.  Psychiatric: His mood appears anxious. His affect is not angry. His speech is not delayed and not slurred. He is not actively hallucinating. Thought content is paranoid. He exhibits a depressed mood. He expresses suicidal ideation. He expresses no homicidal ideation. He exhibits normal recent memory.     ED Treatments / Results  Labs (all  labs ordered are listed, but only abnormal results are displayed) Labs Reviewed  COMPREHENSIVE METABOLIC PANEL - Abnormal; Notable for the following:       Result Value   Sodium 126 (*)    Chloride 96 (*)    CO2 21 (*)    Glucose, Bld 120 (*)    Creatinine, Ser 1.80 (*)    ALT 13 (*)    GFR calc non Af Amer 40 (*)    GFR calc Af Amer 47 (*)    All other components within normal limits  CBC WITH DIFFERENTIAL/PLATELET - Abnormal; Notable for the following:    MCHC 37.2 (*)    All other components within normal limits  ACETAMINOPHEN LEVEL - Abnormal; Notable for the following:    Acetaminophen (Tylenol), Serum <10 (*)    All other components within normal limits  ACETAMINOPHEN LEVEL - Abnormal; Notable for the following:    Acetaminophen (Tylenol), Serum <10 (*)    All other components within normal limits  CBG MONITORING, ED - Abnormal; Notable for the following:    Glucose-Capillary 126 (*)    All other components within normal limits  CBG MONITORING, ED - Abnormal; Notable for the following:    Glucose-Capillary 114 (*)    All other components within normal limits  CBG MONITORING, ED - Abnormal; Notable for the following:    Glucose-Capillary 169 (*)    All other components within normal limits  CBG MONITORING, ED - Abnormal; Notable for the following:    Glucose-Capillary 113 (*)    All other components within normal limits  ETHANOL  RAPID URINE DRUG SCREEN, HOSP PERFORMED  SALICYLATE LEVEL  URINALYSIS, ROUTINE W REFLEX MICROSCOPIC  TROPONIN I    EKG  EKG Interpretation  Date/Time:  Friday December 24 2016 01:12:24 EST Ventricular Rate:  89 PR Interval:    QRS Duration: 93 QT Interval:  380 QTC Calculation: 463 R Axis:   -116 Text Interpretation:  Sinus rhythm LAD, consider left anterior fascicular block Low voltage, precordial leads Abnormal lateral Q waves Probable anteroseptal infarct, old No significant change was found Confirmed by Wyvonnia Dusky  MD, STEPHEN [phone]) on  12/24/2016 1:36:27 AM       Radiology No results found.  Procedures Procedures (including critical care time)  Medications Ordered in ED Medications  sodium chloride 0.9 % bolus 1,000 mL (0 mLs Intravenous Stopped 12/24/16 0503)  acetaminophen (TYLENOL) tablet 650 mg (650 mg Oral Given 12/24/16 0120)  sodium chloride 0.9 % bolus 1,000 mL (0 mLs Intravenous Stopped 12/24/16 0503)     Initial Impression / Assessment and Plan / ED Course  I have reviewed the triage vital signs and the nursing notes.  Pertinent labs & imaging results that were available during my care of the patient were reviewed by me and considered in my medical decision making (see chart for details).     Patient's history is concerning for decline due to possible beta blocker overdose. Poison control was contacted and recommended cardiac monitoring, EKG, and lab work as stated. BP has been around 90s/60s but HR has remained normal (80s) throughout shift. Labs show evidence of AKI with increase in creatinine (1.8) which is similar to values during prior kidney injury. Sodium level is also low (126) which has been trending down for past year. U/S revealed urine in nondistended bladder. Patient unable to go to bathroom so bladder scan obtained. Urinalysis was obtained which was pending at end of shift. Low likelihood of BB overdose since HR has remained normal since arrival and absence of severe hypotension. Patient will be evaluated by psych. Patient information given to Dr. Wyvonnia Dusky at end of shift.  Final Clinical Impressions(s) / ED Diagnoses   Final diagnoses:  Chronic hyponatremia  Intentional overdose of drug in tablet form [facility])    New Prescriptions Discharge Medication List as of 12/24/2016  5:23 PM       Delia Heady, PA 12/23/16 [place], PA 12/24/16 2331    Noemi Chapel, MD 12/25/16 1014

## 2016-12-23 NOTE — ED Notes (Signed)
Pt states chest pain today. HX of MR. Pt states taking Lisinopril and Metoprolol today. 24 tablets of Lisinopril bottle missing. No Metoprolol bottle at [facility]. Poison control called.  Recommended Cardiac Monitoring, Monitoring CBG, EKG, monitoring Electrolyte level, full blood workup, salicylate, acetaminophen level, LFT levels.

## 2016-12-23 NOTE — Telephone Encounter (Signed)
Call given to nurse °

## 2016-12-23 NOTE — ED Provider Notes (Signed)
The patient is a 56 year old male, currently lives by himself but after his mother was placed in a nursing home last month he has had a progressive decline. He states that he cannot afford the house that he lives in, he is progressively depressed and became suicidal. He reports that prior to arrival he took a whole bunch of his blood pressure medications including above overdose of metoprolol and of his ACE inhibitor. It is unclear exactly how much she took, the patient does not have a bottle of metoprolol with him. He denies physical complaint at this time and on exam has a very soft abdomen which is nontender, there is no distended urinary bladder. His heart and lung sounds are normal, he appears to have a depressed affect, he does endorse suicidality. He does not appear to be responding to internal stimuli. The patient will need a psychiatric evaluation. He will need observation for possible beta blocker overdose though after several hours of being here since her reported overdose he has not yet developed any bradycardia or significant hypotension. He does have some hyponatremia though this appears to be chronic, he also has an acute kidney injury but this is not that much above last year when his creatinine was 1.5.  Pt has been accepted at [facility] by Dr. Ananias Pilgrim  Medical screening examination/treatment/procedure(s) were conducted as a shared visit with non-physician practitioner(s) and myself.  I personally evaluated the patient during the encounter.  Clinical Impression:   Final diagnoses:  Chronic hyponatremia  Intentional overdose of drug in tablet form (HCC)         Noemi Chapel, MD 12/25/16 1013

## 2016-12-24 ENCOUNTER — Other Ambulatory Visit: Payer: Self-pay | Admitting: Licensed Clinical Social Worker

## 2016-12-24 ENCOUNTER — Other Ambulatory Visit: Payer: Self-pay | Admitting: *Deleted

## 2016-12-24 DIAGNOSIS — Z9109 Other allergy status, other than to drugs and biological substances: Secondary | ICD-10-CM | POA: Diagnosis not present

## 2016-12-24 DIAGNOSIS — E1159 Type 2 diabetes mellitus with other circulatory complications: Secondary | ICD-10-CM | POA: Diagnosis not present

## 2016-12-24 DIAGNOSIS — Z7982 Long term (current) use of aspirin: Secondary | ICD-10-CM | POA: Diagnosis not present

## 2016-12-24 DIAGNOSIS — F17200 Nicotine dependence, unspecified, uncomplicated: Secondary | ICD-10-CM | POA: Diagnosis present

## 2016-12-24 DIAGNOSIS — K5901 Slow transit constipation: Secondary | ICD-10-CM | POA: Diagnosis not present

## 2016-12-24 DIAGNOSIS — J449 Chronic obstructive pulmonary disease, unspecified: Secondary | ICD-10-CM | POA: Diagnosis not present

## 2016-12-24 DIAGNOSIS — Z794 Long term (current) use of insulin: Secondary | ICD-10-CM | POA: Diagnosis not present

## 2016-12-24 DIAGNOSIS — M545 Low back pain: Secondary | ICD-10-CM | POA: Diagnosis not present

## 2016-12-24 DIAGNOSIS — F419 Anxiety disorder, unspecified: Secondary | ICD-10-CM | POA: Diagnosis present

## 2016-12-24 DIAGNOSIS — I10 Essential (primary) hypertension: Secondary | ICD-10-CM | POA: Diagnosis not present

## 2016-12-24 DIAGNOSIS — K5900 Constipation, unspecified: Secondary | ICD-10-CM | POA: Diagnosis present

## 2016-12-24 DIAGNOSIS — R252 Cramp and spasm: Secondary | ICD-10-CM | POA: Diagnosis not present

## 2016-12-24 DIAGNOSIS — F259 Schizoaffective disorder, unspecified: Secondary | ICD-10-CM | POA: Diagnosis not present

## 2016-12-24 DIAGNOSIS — T464X2A Poisoning by angiotensin-converting-enzyme inhibitors, intentional self-harm, initial encounter: Secondary | ICD-10-CM | POA: Diagnosis not present

## 2016-12-24 DIAGNOSIS — R45851 Suicidal ideations: Secondary | ICD-10-CM | POA: Diagnosis not present

## 2016-12-24 DIAGNOSIS — Z79899 Other long term (current) drug therapy: Secondary | ICD-10-CM | POA: Diagnosis not present

## 2016-12-24 DIAGNOSIS — I2510 Atherosclerotic heart disease of native coronary artery without angina pectoris: Secondary | ICD-10-CM | POA: Diagnosis not present

## 2016-12-24 DIAGNOSIS — I219 Acute myocardial infarction, unspecified: Secondary | ICD-10-CM | POA: Diagnosis present

## 2016-12-24 DIAGNOSIS — T447X2A Poisoning by beta-adrenoreceptor antagonists, intentional self-harm, initial encounter: Secondary | ICD-10-CM | POA: Diagnosis not present

## 2016-12-24 DIAGNOSIS — E119 Type 2 diabetes mellitus without complications: Secondary | ICD-10-CM | POA: Diagnosis not present

## 2016-12-24 DIAGNOSIS — I959 Hypotension, unspecified: Secondary | ICD-10-CM | POA: Diagnosis present

## 2016-12-24 DIAGNOSIS — E871 Hypo-osmolality and hyponatremia: Secondary | ICD-10-CM | POA: Diagnosis not present

## 2016-12-24 DIAGNOSIS — Z7984 Long term (current) use of oral hypoglycemic drugs: Secondary | ICD-10-CM | POA: Diagnosis not present

## 2016-12-24 DIAGNOSIS — Z885 Allergy status to narcotic agent status: Secondary | ICD-10-CM | POA: Diagnosis present

## 2016-12-24 DIAGNOSIS — G8929 Other chronic pain: Secondary | ICD-10-CM | POA: Diagnosis not present

## 2016-12-24 LAB — CBG MONITORING, ED
GLUCOSE-CAPILLARY: 114 mg/dL — AB (ref 65–99)
GLUCOSE-CAPILLARY: 169 mg/dL — AB (ref 65–99)
GLUCOSE-CAPILLARY: 113 mg/dL — AB (ref 65–99)

## 2016-12-24 LAB — URINALYSIS, ROUTINE W REFLEX MICROSCOPIC
BILIRUBIN URINE: NEGATIVE
Glucose, UA: NEGATIVE mg/dL
Hgb urine dipstick: NEGATIVE
KETONES UR: NEGATIVE mg/dL
LEUKOCYTES UA: NEGATIVE
NITRITE: NEGATIVE
PROTEIN: NEGATIVE mg/dL
Specific Gravity, Urine: 1.008 (ref 1.005–1.030)
pH: 5 (ref 5.0–8.0)

## 2016-12-24 LAB — RAPID URINE DRUG SCREEN, HOSP PERFORMED
AMPHETAMINES: NOT DETECTED
Barbiturates: NOT DETECTED
Benzodiazepines: NOT DETECTED
Cocaine: NOT DETECTED
Opiates: NOT DETECTED
TETRAHYDROCANNABINOL: NOT DETECTED

## 2016-12-24 LAB — ACETAMINOPHEN LEVEL

## 2016-12-24 LAB — TROPONIN I: Troponin I: <0.03 ng/mL (ref <0.03)

## 2016-12-24 MED ORDER — METFORMIN HCL ER 500 MG PO TB24
500.0000 mg | ORAL_TABLET | Freq: Two times a day (BID) | ORAL | Status: DC
  Administered 2016-12-24: 500 mg via ORAL
  Filled 2016-12-24 (×7): qty 1

## 2016-12-24 MED ORDER — ACETAMINOPHEN 325 MG PO TABS
650.0000 mg | ORAL_TABLET | Freq: Once | ORAL | Status: AC
  Administered 2016-12-24: 650 mg via ORAL

## 2016-12-24 MED ORDER — CLONAZEPAM 0.5 MG PO TABS
ORAL_TABLET | ORAL | Status: AC
  Filled 2016-12-24: qty 2

## 2016-12-24 MED ORDER — ACETAMINOPHEN 325 MG PO TABS
ORAL_TABLET | ORAL | Status: AC
  Filled 2016-12-24: qty 1

## 2016-12-24 MED ORDER — SODIUM CHLORIDE 0.9 % IV BOLUS (SEPSIS)
1000.0000 mL | Freq: Once | INTRAVENOUS | Status: AC
  Administered 2016-12-24: 1000 mL via INTRAVENOUS

## 2016-12-24 MED ORDER — INSULIN ASPART 100 UNIT/ML ~~LOC~~ SOLN
0.0000 [IU] | Freq: Three times a day (TID) | SUBCUTANEOUS | Status: DC
  Administered 2016-12-24: 2 [IU] via SUBCUTANEOUS
  Filled 2016-12-24: qty 1

## 2016-12-24 MED ORDER — GABAPENTIN 400 MG PO CAPS
ORAL_CAPSULE | ORAL | Status: AC
  Filled 2016-12-24: qty 2

## 2016-12-24 NOTE — ED Notes (Signed)
Pt is alert and oriented, Pt tells me that it is his wish to live in a nursing home.  Preferably [facility] in [place]. Pt tells me that the owner told him that if he gets an FL2 and has his "payor arrange that his check will cover it" he can get a bed there.  Pt tells me that he told MD.  Pt notified that he has a bed at [facility] and pt states that he is willing to go but that he wants to be closer to his mother long term, I told him that the placement at [facility] is more of a short term placement from what I understand.  Pt is in no distress at this time

## 2016-12-24 NOTE — ED Notes (Signed)
Pt given blue socks, diet ginger ale, graham cracker and peanut butter

## 2016-12-24 NOTE — Progress Notes (Signed)
Pt.  Accepted at [facility], Accepting MD is Dr. Morrell Riddle, Bed [phone], Call report to  [phone]. [name] notified.  May come any time.  Please call Melissa with an ETA.   Areatha Keas. Judi Cong, MSW, [place] Work Disposition [phone]

## 2016-12-24 NOTE — Progress Notes (Signed)
Referred pt to [facility]. Melissa, intake RN [phone], states pt has been accepted pending pt agreeing to sign voluntary admission form. CSW informed [facility] of Melissa's request- she is faxing [place] consent to [facility] for pt and witness to sign. Once completed and returned, Lenna Sciara states she will call back with bed assignment and time pt can arrive.  Sharren Bridge, MSW, LCSW Clinical Social Work, Disposition  12/24/2016 [phone]

## 2016-12-24 NOTE — ED Notes (Signed)
Call to [facility] for transportation, pt notified.  ETA 64minutes

## 2016-12-24 NOTE — Patient Outreach (Signed)
RN CM spoke with [name]- nurse at primary MD office- Dr. Warrick [name]- reported pt went to ED last night and is asking for placement into facility.  [name] states their office is still willing to see pt if he discharges back to community. RN CM called [name] [phone] extension [phone]- at [facility] and she reports pt is connected with the most intensive behavioral health program through [facility] called [facility] ( [place] [facility]) and this agency has worked with this pt for quite some time and are available 24 hours, she states " they have their own doctors, nurses, they are a hospital on wheels and pt can call them anytime"  Ms. [name] is very familiar with pt also, she states the [facility] program also provides transportation for pt, [name] states since patient's mother is now in SNF, pt is alone and has not done well with this.  RN CM reported to [name] that pt went to ED last night and is now requesting placement into facility. RN CM called [facility] CSW and reported the above information.  RN CM notified hospital liason [name] that went to ED/  [facility].  PLAN Continue to follow  [name] [facility], BSN [place] Coordinator [phone]

## 2016-12-24 NOTE — ED Notes (Signed)
Pt ambulated to bathroom 

## 2016-12-24 NOTE — ED Notes (Addendum)
I wanted to give pt a meal before he is picked up, made him a TV dinner but pt was picked up by [facility] before I could get it all together.  Pt was given back his black backpack (contents not inspected) and 2 white pt belongings bags to take with him to [place].  Pt was in no distress

## 2016-12-24 NOTE — ED Notes (Signed)
Report called to [facility]. Spoke with Melissa at [place] and gave her report.

## 2016-12-24 NOTE — ED Notes (Signed)
Spoke with poison control to given an update; no new orders at this time

## 2016-12-24 NOTE — Patient Outreach (Signed)
Assessment:  CSW received referral on [name]. CSW completed chart review on client on 12/24/16. CSW and RN Jacqlyn Larsen spoke via phone on 12/24/16 about current client needs and transport assistance support for client. CSW called [facility] on 12/24/16 and spoke with [facility] representative about transport support for client. [facility] representative reported that client did not owe [facility] any money for past due transport costs. Representative from [facility] reported that client has Medicare and Medicaid benefits. Representative reported that client could call [facility] 3 business days ahead of scheduled client appointment to arrange [facility] transport. Client can use Medicaid benefit with [facility] for transport assistance for client for medical appointment in [place], [place] will also help arrange medical transport for client to medical appointment out of [place], [place] through use of client's Medicaid benefit. CSW shared this information with RN Jacqlyn Larsen on 12/24/16.   Plan:  CSW to collaborate with RN Jacqlyn Larsen in monitoring needs of client.   Norva Riffle.Parthiv Mucci MSW, LCSW Licensed Clinical Social Worker [facility] Care Management [phone]

## 2016-12-24 NOTE — ED Notes (Signed)
Pt ate lunch well, completed his tray.  Pt was offered a bath and new paper scrubs, he declined both.  Attempted to encourage him to have a shower but pt exercised his rights to refuse this.

## 2016-12-24 NOTE — Progress Notes (Signed)
Spoke with [facility] RN who advises pt signed [place] admission consent form and it was faxed back to [place] [facility]. Notified Melissa, intake at [facility]. She states as soon as fax is received she will call CSW back with bed assignment and time pt can transfer.  Sharren Bridge, MSW, LCSW Clinical Social Work, Disposition  12/24/2016 [phone]

## 2016-12-24 NOTE — ED Notes (Signed)
Meal given to pt.

## 2016-12-24 NOTE — ED Provider Notes (Signed)
Patient recheck, 2:45 AM. Patient is sleepy but arousable and answers questions appropriately. His blood pressure has decreased to the 87J systolic. Heart rate remains in the 90s. Normal sinus rhythm on EKG. Patient is given IV fluids. He tells me that he took 7 or 8 pills of lisinopril. He also took 4 isosorbides this morning. Denies taking any beta blockers contrary to previous note. States he had episode of chest pain yesterday for which he took several imdur.  He appears to not know exactly what he took.  Repeat EKG nsr, unchanged.  Will add troponin, check repeat APAP.  Troponin negative. Repeat APAP undetectable.  BP somewhat low 95V systolic.  Mental status and HR normal.   After 1 L IVF, BP has improved to 747-185 systolic.  Question accuracy of previous values.  Mental status and HR normal.  No CP or SOB.     Ezequiel Essex, MD 12/24/16 0700

## 2016-12-27 ENCOUNTER — Encounter: Payer: Self-pay | Admitting: Licensed Clinical Social Worker

## 2016-12-27 ENCOUNTER — Other Ambulatory Visit: Payer: Self-pay | Admitting: Licensed Clinical Social Worker

## 2016-12-27 ENCOUNTER — Other Ambulatory Visit: Payer: Self-pay | Admitting: *Deleted

## 2016-12-27 NOTE — Patient Outreach (Signed)
Note received from hospital liason, pt transferred to [facility] for inpatient treatment behavioral health.  RN CM sent in basket to [place] to follow and collaboration with CSW at [facility] and [place], production.  Jacqlyn Larsen [facility], [place] Coordinator [phone]

## 2016-12-27 NOTE — Patient Outreach (Signed)
Assessment:  CSW has received referral on Arth[facility] L. Ryback[facility]. CSW has talked with RN Jacqlyn Larsen several times regarding current  status and needs of client.  Client has previously been seen at [facility] with Dr. Warrick Parisian as primary care doctor.  Dr. Warrick Parisian has mailed client a letter recently informing client that client will no longer be able to receive care at [facility] (after the next 30 days).  Dr. Warrick Parisian has agreed to see client for one more visit at [facility] if [facility] program staff can make transport arrangements for client to attend medical appointment of client with Dr. Warrick Parisian.  Client has multiple medical issues. He has diagnosis of schizoaffective disorder.He has history of alcohol use disorder. 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  His mother is currently residing in a skilled nursing care facility. Client used to reside with his mother. The fact this he and his mother are no longer residing together has been difficult for client. Client has difficulty managing his medications, finances, and daily needs. Client has had history of suicidal attempts (attempts through hanging, electrocution, medication overdose, and through drinking antifreeze). He has had multiple psychiatric admissions for psychiatric care.  He sometimes has paranoia and feels that others are trying to harm him. He has received care in the past from the [facility] treatment team; but may not have been accessing this assistance in recent months.   He told RN Jacqlyn Larsen that he sometimes hears gunshot sounds in his head and believes someone is shooting at him and hides in his closet.  Client had lived a number of months ago at [facility]. He discharged from that group home and began living with his mother. His mother often took care of  basic client needs and helped client with medication administration for client. Again, his mother recently admitted to a skilled nursing facility.  Client has had multiple needs expressed since his mother began residing at a skilled nursing facility.  Client has spoken of medication issues, food procurement issues, financial issues and housing issues. Jacqlyn Larsen RN communicated to [place] on 12/27/16 that client was now a patient at [facility] facility in [place].  [employer] called that facility several times on 12/27/16 and left several phone messages for Linus Orn requesting return call from Linus Orn to discuss client and client needs.    Plan:  Client to cooperate with care providers for client in the next 30 days at [facility] facility.  CSW to collaborate with RN Jacqlyn Larsen in monitoring ongoing client needs.  CSW to call client in two weeks to assess client status and needs at that time.  Norva Riffle.Indira Sorenson MSW, LCSW Licensed Clinical Social Worker [facility] Care Management [phone]

## 2016-12-28 ENCOUNTER — Other Ambulatory Visit: Payer: Self-pay | Admitting: Licensed Clinical Social Worker

## 2016-12-28 NOTE — Patient Outreach (Signed)
Assessment:  CSW Theadore Nan received a call on 12/28/16 from Lindie Spruce LCSW with [facility]. Tammy and Nicki Reaper spoke of current client needs and status.  Tammy said client was admitted to [facility] facility on 12/24/16. Client, per Tammy, does not meet criteria for skilled nursing facility placement. CSW Theadore Nan informed Tammy that client previously resided (last year) at [facility] (phone number: [phone]). Tammy said she would call [facility] to speak with representative of that home regarding room availability at [facility]. Tammy and Nicki Reaper spoke of client history of self harm attempts. Tammy and Nicki Reaper spoke of client history of substance abuse. CSW Scott Tynika Luddy gave Lindie Spruce the [facility] CSW number for Theadore Nan [phone]). Scott encouraged Tammy to call Nicki Reaper as needed to discuss discharge plans and needs of client.  Plan:  CSW to call client as scheduled to assess client needs at that time.  Norva Riffle.Rembert Browe MSW, LCSW Licensed Clinical Social Worker [facility] Care Management [phone]

## 2017-01-03 ENCOUNTER — Telehealth: Payer: Self-pay | Admitting: *Deleted

## 2017-01-03 NOTE — Telephone Encounter (Signed)
Pt called requesting Clonazepam Informed pt that he must be seen for medication Pt disconnected call

## 2017-01-04 ENCOUNTER — Other Ambulatory Visit: Payer: Self-pay | Admitting: Licensed Clinical Social Worker

## 2017-01-04 NOTE — Patient Outreach (Signed)
Assessment:  CSW called Salina April, RN at [employer], on 01/04/17 and spoke with Zigmund Daniel about client location and status. Zigmund Daniel informed CSW that client was discharged from [facility] facility recently.  Zigmund Daniel said client had called Zigmund Daniel recently regarding a medication request. Client is staying with a friend , Elzie Rings in [place], [place] (phone: [phone]). CSW called phone number and informed Helene Kelp that CSW was trying to contact client. CSW asked Helene Kelp to give client CSW phone number of [phone].  Client is still under care of Dr. Warrick Parisian currently at [facility]. Helene Kelp said she would give client CSW name and phone number and ask client to call CSW to discuss client needs.   Plan:  Client to communicate with CSW in next 2 weeks regarding client status and needs.   CSW to call client as scheduled to assess client needs.  Norva Riffle.Makia Bossi MSW, LCSW Licensed Clinical Social Worker [facility] Care Management [phone]

## 2017-01-06 DIAGNOSIS — F17200 Nicotine dependence, unspecified, uncomplicated: Secondary | ICD-10-CM | POA: Diagnosis not present

## 2017-01-06 DIAGNOSIS — R079 Chest pain, unspecified: Secondary | ICD-10-CM | POA: Diagnosis not present

## 2017-01-06 DIAGNOSIS — E119 Type 2 diabetes mellitus without complications: Secondary | ICD-10-CM | POA: Diagnosis not present

## 2017-01-06 DIAGNOSIS — J449 Chronic obstructive pulmonary disease, unspecified: Secondary | ICD-10-CM | POA: Diagnosis not present

## 2017-01-06 DIAGNOSIS — Z5321 Procedure and treatment not carried out due to patient leaving prior to being seen by health care provider: Secondary | ICD-10-CM | POA: Diagnosis not present

## 2017-01-06 DIAGNOSIS — J101 Influenza due to other identified influenza virus with other respiratory manifestations: Secondary | ICD-10-CM | POA: Diagnosis not present

## 2017-01-06 DIAGNOSIS — I10 Essential (primary) hypertension: Secondary | ICD-10-CM | POA: Diagnosis not present

## 2017-01-06 DIAGNOSIS — R0789 Other chest pain: Secondary | ICD-10-CM | POA: Diagnosis not present

## 2017-01-06 DIAGNOSIS — J1089 Influenza due to other identified influenza virus with other manifestations: Secondary | ICD-10-CM | POA: Diagnosis not present

## 2017-01-08 DIAGNOSIS — J449 Chronic obstructive pulmonary disease, unspecified: Secondary | ICD-10-CM | POA: Diagnosis not present

## 2017-01-08 DIAGNOSIS — E119 Type 2 diabetes mellitus without complications: Secondary | ICD-10-CM | POA: Diagnosis not present

## 2017-01-08 DIAGNOSIS — R0789 Other chest pain: Secondary | ICD-10-CM | POA: Diagnosis not present

## 2017-01-08 DIAGNOSIS — R061 Stridor: Secondary | ICD-10-CM | POA: Diagnosis not present

## 2017-01-08 DIAGNOSIS — F17200 Nicotine dependence, unspecified, uncomplicated: Secondary | ICD-10-CM | POA: Diagnosis not present

## 2017-01-08 DIAGNOSIS — I10 Essential (primary) hypertension: Secondary | ICD-10-CM | POA: Diagnosis not present

## 2017-01-08 DIAGNOSIS — F419 Anxiety disorder, unspecified: Secondary | ICD-10-CM | POA: Diagnosis not present

## 2017-01-08 DIAGNOSIS — J111 Influenza due to unidentified influenza virus with other respiratory manifestations: Secondary | ICD-10-CM | POA: Diagnosis not present

## 2017-01-12 ENCOUNTER — Other Ambulatory Visit: Payer: Self-pay

## 2017-01-12 MED ORDER — CYCLOBENZAPRINE HCL 5 MG PO TABS: 5.0000 mg | ORAL_TABLET | Freq: Three times a day (TID) | ORAL | 0 refills | Status: DC | PRN

## 2017-01-14 ENCOUNTER — Other Ambulatory Visit: Payer: Self-pay | Admitting: Family Medicine

## 2017-01-14 ENCOUNTER — Other Ambulatory Visit: Payer: Self-pay | Admitting: Licensed Clinical Social Worker

## 2017-01-14 NOTE — Patient Outreach (Signed)
Assessment;  CSW had left request for client recently that client please return call to CSW at [phone] to speak of current client needs. This request to client was 10 days ago. Client has not returned call to [facility] in past 10 days to speak with CSW about client needs.CSW called client phone number of [phone] on 01/14/17. CSW was not able to speak with client at that number on 01/14/17. Also, answering machine was not set up for phone number called  and CSW was not able to leave message at that number for client. [facility] from [facility] [place], [place]. Upon discharge from that care facility, client began residing in [place], [place] with friend Elzie Rings (phone number [phone]).  Of note, client's mother did pass away recently and client is trying to adjust to the recent death of his mother.  Client has been under the care of Dr. Warrick Parisian of [facility]. Dr. Warrick Parisian has sent letter of discharge to client several weeks ago informing client that Dr. Warrick Parisian will no longer provide medical care for client after 30 days . CSW has requested that client make appointment with Dr. Warrick Parisian for office visit related to client needs. At this time, client has not yet seen Dr. Warrick Parisian for medical care in recent weeks.  Client has also been aligned with care support through [facility] team.  Client may not have always utilized support through [facility] team as available. Client has history of suicidal ideation and client has made several suicide attempts in the past.  He has often called office of Dr. Warrick Parisian requesting medication refills for client but client will refuse to see Dr. Warrick Parisian in person for office visit. Client has become angry with staff at Dr. Merita Norton office and client will hang up or these office stafff members (RN, for example) because it is requested he participate in office visit with Dr. Warrick Parisian. Dr. Warrick Parisian sent letter of  discharge to client because of client's non compliance with recommended medical care.      Plan.   CSW to call client in two weeks to assess needs of client at that time.  Norva Riffle.Deletha Jaffee MSW, LCSW Licensed Clinical Social Worker [facility] Care Management [phone]

## 2017-01-21 ENCOUNTER — Telehealth: Payer: Self-pay | Admitting: Family Medicine

## 2017-01-21 NOTE — Telephone Encounter (Signed)
I do not have any labs for renal function recently, I cannot prescribe this without knowing where his labs were or are

## 2017-01-21 NOTE — Telephone Encounter (Signed)
Attempted to contact patient no answer °

## 2017-01-21 NOTE — Telephone Encounter (Signed)
Patient is complaining of difficulty urinating, constipation, and headaches.  Patient has not taken anything for the constipation or headaches because he does not have any money to buy anything over the counter.  Reports he has medicare/medicaid which would pay for prescription medication.  Patient said furosemind helps him to urinate. Please advise.

## 2017-01-25 DIAGNOSIS — F419 Anxiety disorder, unspecified: Secondary | ICD-10-CM | POA: Diagnosis not present

## 2017-01-25 DIAGNOSIS — R079 Chest pain, unspecified: Secondary | ICD-10-CM | POA: Diagnosis not present

## 2017-01-25 DIAGNOSIS — F17200 Nicotine dependence, unspecified, uncomplicated: Secondary | ICD-10-CM | POA: Diagnosis not present

## 2017-01-25 DIAGNOSIS — R0789 Other chest pain: Secondary | ICD-10-CM | POA: Diagnosis not present

## 2017-01-25 DIAGNOSIS — J440 Chronic obstructive pulmonary disease with acute lower respiratory infection: Secondary | ICD-10-CM | POA: Diagnosis not present

## 2017-01-25 DIAGNOSIS — Z8249 Family history of ischemic heart disease and other diseases of the circulatory system: Secondary | ICD-10-CM | POA: Diagnosis not present

## 2017-01-25 DIAGNOSIS — J181 Lobar pneumonia, unspecified organism: Secondary | ICD-10-CM | POA: Diagnosis not present

## 2017-01-25 DIAGNOSIS — I10 Essential (primary) hypertension: Secondary | ICD-10-CM | POA: Diagnosis not present

## 2017-01-25 DIAGNOSIS — E119 Type 2 diabetes mellitus without complications: Secondary | ICD-10-CM | POA: Diagnosis not present

## 2017-01-28 ENCOUNTER — Encounter: Payer: Self-pay | Admitting: Licensed Clinical Social Worker

## 2017-01-28 ENCOUNTER — Other Ambulatory Visit: Payer: Self-pay | Admitting: Licensed Clinical Social Worker

## 2017-01-28 NOTE — Patient Outreach (Signed)
Assessment:  CSW has communicated several times with Jake Michaelis, RN at practice of Dr. Warrick Parisian.  Client has not been attending regular appointments as scheduled with Dr. Warrick Parisian. Client did attend one appointment with Dr. Warrick Parisian in January of 2018. Client often calls office of Dr. Warrick Parisian requesting medical assistance and RN at practice of Dr. Warrick Parisian has requested client make appointment to see Dr. Warrick Parisian to address medical needs of client. Client has not been consistent in attending medical appointments with Dr. Warrick Parisian.  CSW called client on 01/28/17 and spoke via phone with client. CSW verified client identity.  CSW received verbal permission from client on 01/28/17 for CSW to speak with client about current client needs and status. CSW and client spoke of client contact with office of Dr. Warrick Parisian. CSW encouraged client to contact and communicate with [facility] treatment team staff since client is under ACTT team care. Client was aware of this care. CSW spoke with [place] about [facility]T team care and that client could call ACTT team members for support for client.  Client has been under care of [facility] team members previously. CSW informed Khaliel that CSW would discharge client from [facility] services on 01/28/17.Client agreed to this plan.     Plan:  CSW is discharging Mihcael L. Pineau from [facility] on 01/28/17.  CSW to inform Alycia Rossetti that [facility] discharged client from [facility] CSW services on 01/28/17.  CSW to fax physician case closure letter to Dr. Warrick Parisian informing Dr. Warrick Parisian that [facility] discharged client from [facility] CSW services on 01/28/17.  Norva Riffle.Makyiah Lie MSW, LCSW Licensed Clinical Social Worker [facility] Care Management [phone]

## 2017-01-31 NOTE — Telephone Encounter (Signed)
Patient has been dismissed from practice.

## 2017-02-03 ENCOUNTER — Telehealth: Payer: Self-pay

## 2017-02-04 NOTE — Telephone Encounter (Signed)
Patient is not a is any more, he was let go from her practice

## 2017-02-04 NOTE — Telephone Encounter (Signed)
Aware. Dismissed from our practice and no referrals will be done.

## 2017-02-05 DIAGNOSIS — E669 Obesity, unspecified: Secondary | ICD-10-CM | POA: Diagnosis not present

## 2017-02-05 DIAGNOSIS — F17200 Nicotine dependence, unspecified, uncomplicated: Secondary | ICD-10-CM | POA: Diagnosis not present

## 2017-02-05 DIAGNOSIS — R079 Chest pain, unspecified: Secondary | ICD-10-CM | POA: Diagnosis not present

## 2017-02-05 DIAGNOSIS — Z8249 Family history of ischemic heart disease and other diseases of the circulatory system: Secondary | ICD-10-CM | POA: Diagnosis not present

## 2017-02-05 DIAGNOSIS — F411 Generalized anxiety disorder: Secondary | ICD-10-CM | POA: Diagnosis not present

## 2017-02-05 DIAGNOSIS — J449 Chronic obstructive pulmonary disease, unspecified: Secondary | ICD-10-CM | POA: Diagnosis not present

## 2017-02-05 DIAGNOSIS — F410 Panic disorder [episodic paroxysmal anxiety] without agoraphobia: Secondary | ICD-10-CM | POA: Diagnosis not present

## 2017-02-05 DIAGNOSIS — E119 Type 2 diabetes mellitus without complications: Secondary | ICD-10-CM | POA: Diagnosis not present

## 2017-02-05 DIAGNOSIS — M25552 Pain in left hip: Secondary | ICD-10-CM | POA: Diagnosis not present

## 2017-02-05 DIAGNOSIS — R0789 Other chest pain: Secondary | ICD-10-CM | POA: Diagnosis not present

## 2017-02-18 DIAGNOSIS — Z794 Long term (current) use of insulin: Secondary | ICD-10-CM | POA: Diagnosis not present

## 2017-02-18 DIAGNOSIS — E119 Type 2 diabetes mellitus without complications: Secondary | ICD-10-CM | POA: Diagnosis not present

## 2017-02-18 DIAGNOSIS — I10 Essential (primary) hypertension: Secondary | ICD-10-CM | POA: Diagnosis not present

## 2017-02-18 DIAGNOSIS — E1140 Type 2 diabetes mellitus with diabetic neuropathy, unspecified: Secondary | ICD-10-CM | POA: Diagnosis not present

## 2017-02-21 ENCOUNTER — Other Ambulatory Visit: Payer: Self-pay | Admitting: Family Medicine

## 2017-02-21 DIAGNOSIS — I10 Essential (primary) hypertension: Secondary | ICD-10-CM

## 2017-02-23 ENCOUNTER — Telehealth: Payer: Self-pay | Admitting: Family Medicine

## 2017-02-23 NOTE — Telephone Encounter (Signed)
Patient is scheduled for appt w/Dr. Meda Coffee May 24th @ 9 to establish care.  He calls because his Medicaid Card has been issued with [facility].  I spoke with administrator re coverage- we will file with medicaid and if he is a good fit for Dr Meda Coffee, he can change the PCP.  He has already contacted his case worker.  He states his mother has just passed away in Feb 07, 2023 and the day she was buried, his 57 year old nephew committed suicide.  He mentions that has been to the ER 7 times since these events dealing with anxiety.  Patient is diabetic and numbers are 40-400 and was told it was stress related. Patient feeling desperate for a sooner appt than 24th of May.  Please let me know if we can accommodate sooner.

## 2017-02-24 ENCOUNTER — Other Ambulatory Visit: Payer: Self-pay | Admitting: Family

## 2017-02-24 NOTE — Telephone Encounter (Signed)
Chart reviewed.  Can NOT accommodate him sooner.  Make no promises that he will be accepted here.

## 2017-02-25 ENCOUNTER — Other Ambulatory Visit: Payer: Self-pay | Admitting: Family Medicine

## 2017-03-03 DIAGNOSIS — I10 Essential (primary) hypertension: Secondary | ICD-10-CM | POA: Diagnosis not present

## 2017-03-03 DIAGNOSIS — J449 Chronic obstructive pulmonary disease, unspecified: Secondary | ICD-10-CM | POA: Diagnosis not present

## 2017-03-03 DIAGNOSIS — R109 Unspecified abdominal pain: Secondary | ICD-10-CM | POA: Diagnosis not present

## 2017-03-03 DIAGNOSIS — F17200 Nicotine dependence, unspecified, uncomplicated: Secondary | ICD-10-CM | POA: Diagnosis not present

## 2017-03-03 DIAGNOSIS — G8929 Other chronic pain: Secondary | ICD-10-CM | POA: Diagnosis not present

## 2017-03-03 DIAGNOSIS — R069 Unspecified abnormalities of breathing: Secondary | ICD-10-CM | POA: Diagnosis not present

## 2017-03-03 DIAGNOSIS — R1032 Left lower quadrant pain: Secondary | ICD-10-CM | POA: Diagnosis not present

## 2017-03-03 DIAGNOSIS — R1012 Left upper quadrant pain: Secondary | ICD-10-CM | POA: Diagnosis not present

## 2017-03-03 DIAGNOSIS — E119 Type 2 diabetes mellitus without complications: Secondary | ICD-10-CM | POA: Diagnosis not present

## 2017-03-10 ENCOUNTER — Ambulatory Visit: Payer: Medicare Other | Admitting: Family Medicine

## 2017-03-11 ENCOUNTER — Other Ambulatory Visit: Payer: Self-pay | Admitting: Family Medicine

## 2017-03-11 DIAGNOSIS — Z79899 Other long term (current) drug therapy: Secondary | ICD-10-CM | POA: Diagnosis not present

## 2017-03-11 DIAGNOSIS — I10 Essential (primary) hypertension: Secondary | ICD-10-CM | POA: Diagnosis not present

## 2017-03-11 DIAGNOSIS — E785 Hyperlipidemia, unspecified: Secondary | ICD-10-CM | POA: Diagnosis not present

## 2017-03-11 DIAGNOSIS — E119 Type 2 diabetes mellitus without complications: Secondary | ICD-10-CM | POA: Diagnosis not present

## 2017-03-16 ENCOUNTER — Other Ambulatory Visit: Payer: Self-pay | Admitting: Family Medicine

## 2017-03-16 DIAGNOSIS — I10 Essential (primary) hypertension: Secondary | ICD-10-CM

## 2017-03-18 DIAGNOSIS — J449 Chronic obstructive pulmonary disease, unspecified: Secondary | ICD-10-CM | POA: Diagnosis not present

## 2017-03-18 DIAGNOSIS — I10 Essential (primary) hypertension: Secondary | ICD-10-CM | POA: Diagnosis not present

## 2017-03-18 DIAGNOSIS — E1165 Type 2 diabetes mellitus with hyperglycemia: Secondary | ICD-10-CM | POA: Diagnosis not present

## 2017-03-18 DIAGNOSIS — M25552 Pain in left hip: Secondary | ICD-10-CM | POA: Diagnosis not present

## 2017-03-28 ENCOUNTER — Telehealth: Payer: Self-pay | Admitting: Family Medicine

## 2017-03-28 NOTE — Telephone Encounter (Signed)
Per dr Meda Coffee we are not resch. No shows.

## 2017-03-28 NOTE — Telephone Encounter (Signed)
Can patient be scheduled again from [place].  He states he has been put into a nursing facility.

## 2017-04-06 ENCOUNTER — Other Ambulatory Visit: Payer: Self-pay | Admitting: Family Medicine

## 2017-04-06 DIAGNOSIS — I10 Essential (primary) hypertension: Secondary | ICD-10-CM

## 2017-04-09 DIAGNOSIS — M79604 Pain in right leg: Secondary | ICD-10-CM | POA: Diagnosis not present

## 2017-04-09 DIAGNOSIS — N400 Enlarged prostate without lower urinary tract symptoms: Secondary | ICD-10-CM | POA: Diagnosis not present

## 2017-04-09 DIAGNOSIS — J449 Chronic obstructive pulmonary disease, unspecified: Secondary | ICD-10-CM | POA: Diagnosis not present

## 2017-04-09 DIAGNOSIS — L309 Dermatitis, unspecified: Secondary | ICD-10-CM | POA: Diagnosis not present

## 2017-04-19 DIAGNOSIS — F419 Anxiety disorder, unspecified: Secondary | ICD-10-CM | POA: Diagnosis not present

## 2017-04-19 DIAGNOSIS — F17200 Nicotine dependence, unspecified, uncomplicated: Secondary | ICD-10-CM | POA: Diagnosis not present

## 2017-04-19 DIAGNOSIS — J449 Chronic obstructive pulmonary disease, unspecified: Secondary | ICD-10-CM | POA: Diagnosis not present

## 2017-04-19 DIAGNOSIS — F23 Brief psychotic disorder: Secondary | ICD-10-CM | POA: Diagnosis not present

## 2017-04-19 DIAGNOSIS — I10 Essential (primary) hypertension: Secondary | ICD-10-CM | POA: Diagnosis not present

## 2017-04-19 DIAGNOSIS — E119 Type 2 diabetes mellitus without complications: Secondary | ICD-10-CM | POA: Diagnosis not present

## 2017-04-19 DIAGNOSIS — M5489 Other dorsalgia: Secondary | ICD-10-CM | POA: Diagnosis not present

## 2017-04-19 DIAGNOSIS — R079 Chest pain, unspecified: Secondary | ICD-10-CM | POA: Diagnosis not present

## 2017-04-22 DIAGNOSIS — F419 Anxiety disorder, unspecified: Secondary | ICD-10-CM | POA: Diagnosis not present

## 2017-04-22 DIAGNOSIS — E785 Hyperlipidemia, unspecified: Secondary | ICD-10-CM | POA: Diagnosis not present

## 2017-04-22 DIAGNOSIS — M545 Low back pain: Secondary | ICD-10-CM | POA: Diagnosis not present

## 2017-04-22 DIAGNOSIS — I10 Essential (primary) hypertension: Secondary | ICD-10-CM | POA: Diagnosis not present

## 2017-04-23 DIAGNOSIS — J449 Chronic obstructive pulmonary disease, unspecified: Secondary | ICD-10-CM | POA: Diagnosis not present

## 2017-04-23 DIAGNOSIS — M79605 Pain in left leg: Secondary | ICD-10-CM | POA: Diagnosis not present

## 2017-04-23 DIAGNOSIS — G4700 Insomnia, unspecified: Secondary | ICD-10-CM | POA: Diagnosis not present

## 2017-04-23 DIAGNOSIS — M79604 Pain in right leg: Secondary | ICD-10-CM | POA: Diagnosis not present

## 2017-04-23 DIAGNOSIS — F419 Anxiety disorder, unspecified: Secondary | ICD-10-CM | POA: Diagnosis not present

## 2017-04-23 DIAGNOSIS — F17200 Nicotine dependence, unspecified, uncomplicated: Secondary | ICD-10-CM | POA: Diagnosis not present

## 2017-04-23 DIAGNOSIS — Z7951 Long term (current) use of inhaled steroids: Secondary | ICD-10-CM | POA: Diagnosis not present

## 2017-04-23 DIAGNOSIS — Z794 Long term (current) use of insulin: Secondary | ICD-10-CM | POA: Diagnosis not present

## 2017-04-23 DIAGNOSIS — Z79899 Other long term (current) drug therapy: Secondary | ICD-10-CM | POA: Diagnosis not present

## 2017-04-23 DIAGNOSIS — M5489 Other dorsalgia: Secondary | ICD-10-CM | POA: Diagnosis not present

## 2017-04-23 DIAGNOSIS — Z7982 Long term (current) use of aspirin: Secondary | ICD-10-CM | POA: Diagnosis not present

## 2017-04-23 DIAGNOSIS — E785 Hyperlipidemia, unspecified: Secondary | ICD-10-CM | POA: Diagnosis not present

## 2017-04-23 DIAGNOSIS — M549 Dorsalgia, unspecified: Secondary | ICD-10-CM | POA: Diagnosis not present

## 2017-04-23 DIAGNOSIS — E119 Type 2 diabetes mellitus without complications: Secondary | ICD-10-CM | POA: Diagnosis not present

## 2017-04-23 DIAGNOSIS — I10 Essential (primary) hypertension: Secondary | ICD-10-CM | POA: Diagnosis not present

## 2017-04-26 ENCOUNTER — Other Ambulatory Visit: Payer: Self-pay | Admitting: Family Medicine

## 2017-04-26 ENCOUNTER — Encounter (HOSPITAL_COMMUNITY): Payer: Self-pay

## 2017-04-26 ENCOUNTER — Emergency Department (HOSPITAL_COMMUNITY)
Admission: EM | Admit: 2017-04-26 | Discharge: 2017-04-28 | Disposition: A | Discharge status: Home / Self Care | Payer: Medicare Other | Attending: Emergency Medicine | Admitting: Emergency Medicine

## 2017-04-26 DIAGNOSIS — I10 Essential (primary) hypertension: Secondary | ICD-10-CM | POA: Insufficient documentation

## 2017-04-26 DIAGNOSIS — F32A Depression, unspecified: Secondary | ICD-10-CM

## 2017-04-26 DIAGNOSIS — F329 Major depressive disorder, single episode, unspecified: Secondary | ICD-10-CM | POA: Diagnosis not present

## 2017-04-26 DIAGNOSIS — E119 Type 2 diabetes mellitus without complications: Secondary | ICD-10-CM | POA: Insufficient documentation

## 2017-04-26 DIAGNOSIS — J432 Centrilobular emphysema: Secondary | ICD-10-CM

## 2017-04-26 DIAGNOSIS — G8929 Other chronic pain: Secondary | ICD-10-CM | POA: Insufficient documentation

## 2017-04-26 DIAGNOSIS — R45851 Suicidal ideations: Secondary | ICD-10-CM | POA: Insufficient documentation

## 2017-04-26 DIAGNOSIS — F17210 Nicotine dependence, cigarettes, uncomplicated: Secondary | ICD-10-CM | POA: Diagnosis not present

## 2017-04-26 DIAGNOSIS — Z79899 Other long term (current) drug therapy: Secondary | ICD-10-CM | POA: Diagnosis not present

## 2017-04-26 DIAGNOSIS — F29 Unspecified psychosis not due to a substance or known physiological condition: Secondary | ICD-10-CM | POA: Diagnosis not present

## 2017-04-26 DIAGNOSIS — Z7982 Long term (current) use of aspirin: Secondary | ICD-10-CM | POA: Diagnosis not present

## 2017-04-26 DIAGNOSIS — J449 Chronic obstructive pulmonary disease, unspecified: Secondary | ICD-10-CM | POA: Insufficient documentation

## 2017-04-26 DIAGNOSIS — Z794 Long term (current) use of insulin: Secondary | ICD-10-CM | POA: Diagnosis not present

## 2017-04-26 HISTORY — DX: Dorsalgia, unspecified: M54.9

## 2017-04-26 LAB — CBC WITH DIFFERENTIAL/PLATELET
Basophils Absolute: 0.1 10*3/uL (ref 0.0–0.1)
Basophils Relative: 1 %
EOS ABS: 0.2 10*3/uL (ref 0.0–0.7)
EOS PCT: 3 %
HCT: 44.7 % (ref 39.0–52.0)
Hemoglobin: 16.2 g/dL (ref 13.0–17.0)
LYMPHS ABS: 1.8 10*3/uL (ref 0.7–4.0)
LYMPHS PCT: 26 %
MCH: 32.5 pg (ref 26.0–34.0)
MCHC: 36.2 g/dL — AB (ref 30.0–36.0)
MCV: 89.8 fL (ref 78.0–100.0)
MONO ABS: 0.5 10*3/uL (ref 0.1–1.0)
Monocytes Relative: 7 %
Neutro Abs: 4.5 10*3/uL (ref 1.7–7.7)
Neutrophils Relative %: 63 %
PLATELETS: 139 10*3/uL — AB (ref 150–400)
RBC: 4.98 MIL/uL (ref 4.22–5.81)
RDW: 13.3 % (ref 11.5–15.5)
WBC: 7.1 10*3/uL (ref 4.0–10.5)

## 2017-04-26 LAB — BASIC METABOLIC PANEL
Anion gap: 8 (ref 5–15)
BUN: 9 mg/dL (ref 6–20)
CALCIUM: 9.1 mg/dL (ref 8.9–10.3)
CO2: 21 mmol/L — ABNORMAL LOW (ref 22–32)
CREATININE: 0.91 mg/dL (ref 0.61–1.24)
Chloride: 104 mmol/L (ref 101–111)
GFR calc Af Amer: >60 mL/min (ref >60)
GLUCOSE: 93 mg/dL (ref 65–99)
POTASSIUM: 3.9 mmol/L (ref 3.5–5.1)
SODIUM: 133 mmol/L — AB (ref 135–145)

## 2017-04-26 LAB — RAPID URINE DRUG SCREEN, HOSP PERFORMED
Amphetamines: NOT DETECTED
BARBITURATES: NOT DETECTED
BENZODIAZEPINES: NOT DETECTED
Cocaine: NOT DETECTED
Opiates: NOT DETECTED
Tetrahydrocannabinol: NOT DETECTED

## 2017-04-26 LAB — ETHANOL

## 2017-04-26 LAB — CBG MONITORING, ED
GLUCOSE-CAPILLARY: 98 mg/dL (ref 65–99)
GLUCOSE-CAPILLARY: 101 mg/dL — AB (ref 65–99)

## 2017-04-26 MED ORDER — MOMETASONE FURO-FORMOTEROL FUM 200-5 MCG/ACT IN AERO
INHALATION_SPRAY | RESPIRATORY_TRACT | Status: AC
  Filled 2017-04-26: qty 8.8

## 2017-04-26 MED ORDER — CLONAZEPAM 0.5 MG PO TABS
0.5000 mg | ORAL_TABLET | Freq: Once | ORAL | Status: AC
  Administered 2017-04-26: 0.5 mg via ORAL
  Filled 2017-04-26: qty 1

## 2017-04-26 MED ORDER — CLONAZEPAM 0.5 MG PO TABS
1.0000 mg | ORAL_TABLET | Freq: Two times a day (BID) | ORAL | Status: DC
  Administered 2017-04-26 – 2017-04-28 (×4): 1 mg via ORAL
  Filled 2017-04-26 (×4): qty 2

## 2017-04-26 MED ORDER — ACETAMINOPHEN 325 MG PO TABS
650.0000 mg | ORAL_TABLET | Freq: Once | ORAL | Status: AC
  Administered 2017-04-26: 650 mg via ORAL
  Filled 2017-04-26: qty 2

## 2017-04-26 MED ORDER — MOMETASONE FURO-FORMOTEROL FUM 100-5 MCG/ACT IN AERO
2.0000 | INHALATION_SPRAY | Freq: Two times a day (BID) | RESPIRATORY_TRACT | Status: DC
  Administered 2017-04-26 – 2017-04-28 (×4): 2 via RESPIRATORY_TRACT
  Filled 2017-04-26: qty 8.8

## 2017-04-26 MED ORDER — ISOSORBIDE MONONITRATE ER 30 MG PO TB24
30.0000 mg | ORAL_TABLET | Freq: Every day | ORAL | Status: DC
  Administered 2017-04-27 – 2017-04-28 (×2): 30 mg via ORAL
  Filled 2017-04-26 (×3): qty 1

## 2017-04-26 MED ORDER — METFORMIN HCL ER 500 MG PO TB24
500.0000 mg | ORAL_TABLET | Freq: Two times a day (BID) | ORAL | Status: DC
  Administered 2017-04-26 – 2017-04-28 (×3): 500 mg via ORAL
  Filled 2017-04-26 (×6): qty 1

## 2017-04-26 MED ORDER — ASPIRIN EC 81 MG PO TBEC
81.0000 mg | DELAYED_RELEASE_TABLET | Freq: Every day | ORAL | Status: DC
  Administered 2017-04-26 – 2017-04-28 (×3): 81 mg via ORAL
  Filled 2017-04-26 (×3): qty 1

## 2017-04-26 MED ORDER — INSULIN ASPART 100 UNIT/ML ~~LOC~~ SOLN
14.0000 [IU] | Freq: Every day | SUBCUTANEOUS | Status: DC
  Administered 2017-04-26: 14 [IU] via SUBCUTANEOUS
  Filled 2017-04-26: qty 1

## 2017-04-26 MED ORDER — MOMETASONE FURO-FORMOTEROL FUM 100-5 MCG/ACT IN AERO
INHALATION_SPRAY | RESPIRATORY_TRACT | Status: AC
  Filled 2017-04-26: qty 8.8

## 2017-04-26 MED ORDER — LISINOPRIL 10 MG PO TABS
20.0000 mg | ORAL_TABLET | Freq: Every day | ORAL | Status: DC
  Administered 2017-04-26 – 2017-04-28 (×3): 20 mg via ORAL
  Filled 2017-04-26 (×3): qty 2

## 2017-04-26 MED ORDER — INSULIN ASPART 100 UNIT/ML FLEXPEN: 14.0000 [IU] | PEN_INJECTOR | Freq: Two times a day (BID) | SUBCUTANEOUS | Status: DC

## 2017-04-26 MED ORDER — INSULIN ASPART 100 UNIT/ML ~~LOC~~ SOLN
20.0000 [IU] | Freq: Every day | SUBCUTANEOUS | Status: DC
  Administered 2017-04-28: 20 [IU] via SUBCUTANEOUS
  Filled 2017-04-26 (×2): qty 1

## 2017-04-26 MED ORDER — ZOLPIDEM TARTRATE 5 MG PO TABS
10.0000 mg | ORAL_TABLET | Freq: Every day | ORAL | Status: DC
Start: 2017-04-26 — End: 2017-04-28
  Administered 2017-04-26 – 2017-04-27 (×2): 10 mg via ORAL
  Filled 2017-04-26 (×2): qty 2

## 2017-04-26 MED ORDER — TAMSULOSIN HCL 0.4 MG PO CAPS
0.4000 mg | ORAL_CAPSULE | Freq: Every day | ORAL | Status: DC
  Administered 2017-04-26 – 2017-04-28 (×3): 0.4 mg via ORAL
  Filled 2017-04-26 (×3): qty 1

## 2017-04-26 MED ORDER — GABAPENTIN 400 MG PO CAPS
800.0000 mg | ORAL_CAPSULE | Freq: Every day | ORAL | Status: DC
  Administered 2017-04-26 – 2017-04-28 (×9): 800 mg via ORAL
  Filled 2017-04-26 (×2): qty 2
  Filled 2017-04-26 (×2): qty 8
  Filled 2017-04-26 (×4): qty 2
  Filled 2017-04-26: qty 8
  Filled 2017-04-26: qty 2
  Filled 2017-04-26 (×2): qty 8
  Filled 2017-04-26 (×2): qty 2

## 2017-04-26 MED ORDER — INSULIN NPH (HUMAN) (ISOPHANE) 100 UNIT/ML ~~LOC~~ SUSP
14.0000 [IU] | Freq: Every day | SUBCUTANEOUS | Status: DC
  Administered 2017-04-26 – 2017-04-27 (×2): 14 [IU] via SUBCUTANEOUS

## 2017-04-26 MED ORDER — INSULIN NPH (HUMAN) (ISOPHANE) 100 UNIT/ML ~~LOC~~ SUSP
20.0000 [IU] | Freq: Every day | SUBCUTANEOUS | Status: DC
  Administered 2017-04-27: 20 [IU] via SUBCUTANEOUS
  Filled 2017-04-26: qty 10

## 2017-04-26 MED ORDER — ATORVASTATIN CALCIUM 40 MG PO TABS
40.0000 mg | ORAL_TABLET | Freq: Every day | ORAL | Status: DC
  Administered 2017-04-26 – 2017-04-27 (×2): 40 mg via ORAL
  Filled 2017-04-26 (×2): qty 1

## 2017-04-26 NOTE — Progress Notes (Signed)
Pt referrals faxed to the following inpatient hospitals:  [facility],  [facility],  [facility],  [facility],  [facility],  [facility]. Judi Cong, MSW, [place] Work Disposition [phone]

## 2017-04-26 NOTE — ED Triage Notes (Signed)
Pt resident of [facility].  Reports has been feeling suicidal for "a while."  Reports decreased appetite recently.  CBG 81 per ems.  EMS reports pt told them he wanted to hang himself but doesn't have the means to do it.

## 2017-04-26 NOTE — BH Assessment (Addendum)
Tele Assessment Note   [name] is a 56 y.o. male in [place] voluntarily due to [facility] w/ a plan. Pt shares that he after his IP treatment at [facility] in 02-11-2017, his mom died and he's still grieving her death. He indicates he's been feeling suicidal for "a while". When asked for a specific time frame, pt indicated, "a right good while". Pt also reports having decreased appetite for @ a month. When asked about AVH, pt replies, "I don't know". Pt indicates he came in to the ED today b/c he was going to hang himself but didn't have a rope or a chair. He then said that he had a table to stand on, but wasn't able to get on the table. Pt added that he had a sheet but wasn't able to tie it to the ceiling fan. No HI.    Diagnosis: MDD, recurrent episode, severe  Past Medical History:  Past Medical History:  Diagnosis Date  . Alcohol dependence ([place]) 02/11/78   stated abusing ETOH at age 83   . Back pain   . Benzodiazepine dependence ([place])   . Cardiac arrest ([place])   . Chronic back pain   . Cocaine abuse   . COPD (chronic obstructive pulmonary disease) ([place])   . Diabetes mellitus without complication ([place]) 6503  . Hypertension February 12, 2007  . MI (myocardial infarction) ([place])   . Non-compliance   . Schizoaffective disorder ([place]) 02-11-2005   . Substance abuse     Past Surgical History:  Procedure Laterality Date  . CYST EXCISION      Family History:  Family History  Problem Relation Age of Onset  . Heart disease Mother   . Heart disease Father   . Heart disease Maternal Grandmother   . Diabetes Maternal Grandmother     Social History:  reports that he has been smoking Cigarettes.  He has been smoking about 1.50 packs per day. He has never used smokeless tobacco. He reports that he does not drink alcohol or use drugs.  Additional Social History:  Alcohol / Drug Use Pain Medications: See MAR Prescriptions: See MAR Over the Counter: See MAR History of alcohol / drug use?: No history of alcohol / drug  abuse  CIWA: CIWA-Ar BP: 108/89 Pulse Rate: 94 COWS:    PATIENT STRENGTHS: (choose at least two) Capable of independent living Motivation for treatment/growth  Allergies:  Allergies  Allergen Reactions  . Quetiapine Anaphylaxis  . Seroquel [Quetiapine Fumarate] Anxiety and Other (See Comments)    Reaction:  Nightmares   . Trazodone Anaphylaxis  . Codeine Itching and Other (See Comments)  . Hydroxyzine Other (See Comments)    Pt unable to explain  . Trazodone And Nefazodone Anxiety and Other (See Comments)    Other reaction(s): Unknown Nightmares Reaction:  Nightmares   . Haldol [Haloperidol Lactate] Anxiety and Other (See Comments)    Reaction:  Nightmares     Home Medications:  (Not in a hospital admission)  OB/GYN Status:  No LMP for male patient.  General Assessment Data Location of Assessment: [facility] TTS Assessment: In system Is this a Tele or Face-to-Face Assessment?: Tele Assessment Is this an Initial Assessment or a Re-assessment for this encounter?: Initial Assessment Marital status: Divorced Living Arrangements: Other (Comment) ([facility]) Can pt return to current living arrangement?: Yes Admission Status: Voluntary Is patient capable of signing voluntary admission?: Yes Referral Source: Self/Family/Friend Insurance type: Medicare     Crisis Care Plan Living Arrangements: Other (Comment) ([facility]ing [facility]) Name of Psychiatrist: [facility]  Education Status Is patient currently in school?: No  Risk to self with the past 6 months Suicidal Ideation: Yes-Currently Present Has patient been a risk to self within the past 6 months prior to admission? : Yes Suicidal Intent: Yes-Currently Present Has patient had any suicidal intent within the past 6 months prior to admission? : Yes Is patient at risk for suicide?: Yes Suicidal Plan?: Yes-Currently Present Has patient had any suicidal plan within the past 6 months prior to admission? :  Yes Specify Current Suicidal Plan: hang self Access to Means: Yes Specify Access to Suicidal Means: see narrative What has been your use of drugs/alcohol within the last 12 months?: none noted Previous Attempts/Gestures: Yes Triggers for Past Attempts: Unknown Intentional Self Injurious Behavior: None Family Suicide History: Unknown Recent stressful life event(s): Loss (Comment) Persecutory voices/beliefs?: No Depression: Yes Depression Symptoms: Feeling angry/irritable Substance abuse history and/or treatment for substance abuse?: Yes Suicide prevention information given to non-admitted patients: Not applicable  Risk to Others within the past 6 months Homicidal Ideation: No Does patient have any lifetime risk of violence toward others beyond the six months prior to admission? : No Thoughts of Harm to Others: No Current Homicidal Intent: No Current Homicidal Plan: No Access to Homicidal Means: No History of harm to others?: No Assessment of Violence: None Noted Does patient have access to weapons?: No Criminal Charges Pending?: No Does patient have a court date: No Is patient on probation?: No  Psychosis Hallucinations: None noted Delusions: None noted  Mental Status Report Appearance/Hygiene: Unremarkable Eye Contact: Fair Motor Activity: Unremarkable Speech: Logical/coherent Level of Consciousness: Alert, Irritable Mood: Sad, Irritable Affect: Appropriate to circumstance Anxiety Level: Minimal Thought Processes: Unable to Assess Judgement: Partial Orientation: Person, Place, Time, Situation Obsessive Compulsive Thoughts/Behaviors: None  Cognitive Functioning Concentration: Normal Memory: Recent Impaired, Remote Impaired IQ: Average Insight: see judgement above Impulse Control: Poor Appetite: Poor Sleep: Decreased Vegetative Symptoms: None  ADLScreening [facility] Assessment Services) Patient's cognitive ability adequate to safely complete daily activities?:  Yes Patient able to express need for assistance with ADLs?: Yes Independently performs ADLs?: Yes (appropriate for developmental age)  Prior Inpatient Therapy Prior Inpatient Therapy: Yes Prior Therapy Dates: 12/2016 Prior Therapy Facilty/Provider(s): [facility] Reason for Treatment: SI  Prior Outpatient Therapy Prior Outpatient Therapy: No Does patient have an ACCT team?: Unknown Does patient have Intensive In-House Services?  : No Does patient have [facility] services? : No Does patient have P4CC services?: No  ADL Screening (condition at time of admission) Patient's cognitive ability adequate to safely complete daily activities?: Yes Is the patient deaf or have difficulty hearing?: No Does the patient have difficulty seeing, even when wearing glasses/contacts?: No Does the patient have difficulty concentrating, remembering, or making decisions?: No Patient able to express need for assistance with ADLs?: Yes Does the patient have difficulty dressing or bathing?: No Independently performs ADLs?: Yes (appropriate for developmental age)       Abuse/Neglect Assessment (Assessment to be complete while patient is alone) Physical Abuse: Denies Verbal Abuse: Denies Sexual Abuse: Denies Exploitation of patient/patient's resources: Denies Self-Neglect: Denies Values / Beliefs Cultural Requests During Hospitalization: None Spiritual Requests During Hospitalization: None   Advance Directives (For Healthcare) Does Patient Have a Medical Advance Directive?: No Would patient like information on creating a medical advance directive?: No - Patient declined    Additional Information 1:1 In Past 12 Months?: No CIRT Risk: No Elopement Risk: No Does patient have medical clearance?: No  Disposition:  Disposition Initial Assessment Completed for this Encounter: Yes (consulted with Hughie Closs, NP) Disposition of Patient: Inpatient treatment program Type of inpatient treatment  program: Adult  Rexene Edison 04/26/2017 4:21 PM

## 2017-04-26 NOTE — ED Provider Notes (Signed)
[name] DEPT Provider Note   CSN: [id number] Arrival date & time: 04/26/17  1449     History   Chief Complaint Chief Complaint  Patient presents with  . V70.1    HPI [name] is a 56 y.o. male.  HPI Patient presents for his nursing facility with concern of worsening depression, and suicidal thoughts. Patient notes that he has had a long history of depression, with poor control in spite of multiple medications. Sheryle Hail notes ongoing suicidal thought, but not until today had no specific plan. Today the patient felt as though he wanted to find a rope to hang himself. He was unable to find a rope. He has no focal physical complaints including no specific pain, no lightheadedness, no confusion, no disorientation.  Past Medical History:  Diagnosis Date  . Alcohol dependence ([place]) 1979   stated abusing ETOH at age 24   . Back pain   . Benzodiazepine dependence ([place])   . Cardiac arrest ([place])   . Chronic back pain   . Cocaine abuse   . COPD (chronic obstructive pulmonary disease) ([place])   . Diabetes mellitus without complication ([place]) 4098  . Hypertension 2008  . MI (myocardial infarction) ([place])   . Non-compliance   . Schizoaffective disorder ([place]) 2006   . Substance abuse     Patient Active Problem List   Diagnosis Date Noted  . Diabetic neuropathy ([place]) 11/11/2016  . Anxiety and depression 11/11/2016  . Chronic insomnia 01/08/2016  . Alcohol use disorder ([place]) 12/10/2015  . Benzodiazepine abuse 12/10/2015  . Opioid abuse 12/10/2015  . Constipation 02/06/2015  . BPH (benign prostatic hyperplasia) 01/16/2015  . Vitamin D deficiency 01/16/2015  . Allergic rhinitis 11/19/2014  . Chronic low back pain 11/07/2014  . Onychomycosis of toenail 11/07/2014  . Tinea pedis 11/07/2014  . Psoriasis 10/14/2014  . HTN (hypertension) 10/14/2014  . Diabetes mellitus type 2, controlled ([place]) 10/14/2014  . COPD (chronic obstructive pulmonary disease) ([place]) 10/14/2014  .  Tobacco abuse 10/14/2014  . HLD (hyperlipidemia) 10/14/2014  . Schizoaffective disorder, depressive type ([place]) 10/14/2014    Past Surgical History:  Procedure Laterality Date  . CYST EXCISION         Home Medications    Prior to Admission medications   Medication Sig Start Date End Date Taking? Authorizing Provider  albuterol (PROVENTIL HFA;VENTOLIN HFA) 108 (90 Base) MCG/ACT inhaler Inhale 2 puffs into the lungs every 6 (six) hours as needed for wheezing or shortness of breath. Patient taking differently: Inhale 2 puffs into the lungs every 4 (four) hours as needed for wheezing or shortness of breath.  11/11/16  Yes Dettinger, Fransisca Kaufmann, MD  aspirin EC 81 MG tablet Take 1 tablet (81 mg total) by mouth daily. Reported on 01/02/2016 02/16/16  Yes Funches, Josalyn, MD  atorvastatin (LIPITOR) 40 MG tablet TAKE 1 TABLET BY MOUTH EVERY DAY - EMERGENCY REFILL FAXED DR 12/17/16  Yes Dettinger, Fransisca Kaufmann, MD  clonazePAM (KLONOPIN) 1 MG tablet Take 1 mg by mouth 2 (two) times daily.   Yes [provider]  dextromethorphan-guaiFENesin (MUCINEX DM) 30-600 MG 12hr tablet Take 2 tablets by mouth 2 (two) times daily.   Yes [provider]  docusate sodium (COLACE) 100 MG capsule Take 100 mg by mouth 2 (two) times daily.   Yes [provider]  Fluticasone-Salmeterol (ADVAIR) 100-50 MCG/DOSE AEPB Inhale 1 puff into the lungs 2 (two) times daily. Patient taking differently: Inhale 2 puffs into the lungs 2 (two) times daily.  11/11/16  Yes Dettinger, Fransisca Kaufmann, MD  gabapentin (NEURONTIN) 800 MG tablet Take 1 tablet (800 mg total) by mouth 5 (five) times daily. 12/13/16  Yes Dettinger, Fransisca Kaufmann, MD  hydrOXYzine (VISTARIL) 50 MG capsule Take 50 mg by mouth 3 (three) times daily as needed for itching.    Yes [provider]  insulin aspart (NOVOLOG FLEXPEN) 100 UNIT/ML FlexPen Inject 14-20 Units into the skin 2 (two) times daily before a meal. Inject 20 units in the morning, and  inject 14 units before dinner   Yes [provider]  insulin NPH Human (HUMULIN N,NOVOLIN N) 100 UNIT/ML injection Inject 14-20 Units into the skin 2 (two) times daily before a meal. Inject 20 units in the morning, and inject 14 units 15 minutes before bedtime   Yes [provider]  isosorbide mononitrate (IMDUR) 30 MG 24 hr tablet Take 1 tablet (30 mg total) by mouth daily. Reported on 01/02/2016 02/20/16  Yes Funches, Josalyn, MD  lisinopril (PRINIVIL,ZESTRIL) 20 MG tablet Take 1 tablet (20 mg total) by mouth daily. 12/13/16  Yes Dettinger, Fransisca Kaufmann, MD  metFORMIN (GLUCOPHAGE-XR) 500 MG 24 hr tablet Take 500 mg by mouth 2 (two) times daily.   Yes [provider]  omeprazole (PRILOSEC) 20 MG capsule Take 20 mg by mouth daily.   Yes [provider]  ondansetron (ZOFRAN-ODT) 4 MG disintegrating tablet Take 4 mg by mouth every 6 (six) hours as needed for nausea or vomiting.   Yes [provider]  tamsulosin (FLOMAX) 0.4 MG CAPS capsule Take 0.4 mg by mouth daily.   Yes [provider]  traMADol-acetaminophen (ULTRACET) 37.5-325 MG tablet Take 1 tablet by mouth every 8 (eight) hours as needed.   Yes [provider]  zolpidem (AMBIEN) 10 MG tablet Take 10 mg by mouth at bedtime as needed for sleep.   Yes [provider]  acetaminophen (TYLENOL 8 HOUR) 650 MG CR tablet Take 1 tablet (650 mg total) by mouth every 8 (eight) hours as needed for pain. Patient not taking: Reported on 04/26/2017 11/12/16   Dettinger, Fransisca Kaufmann, MD  Blood Glucose Monitoring Suppl (BLOOD GLUCOSE MONITOR SYSTEM) w/Device KIT 1 each by Does not apply route daily. 11/11/16   Dettinger, Fransisca Kaufmann, MD  cetirizine (ZYRTEC) 10 MG tablet Take 1 tablet (10 mg total) by mouth daily. Patient not taking: Reported on 04/26/2017 02/20/16   Boykin Nearing, MD  cyclobenzaprine (FLEXERIL) 5 MG tablet Take 1 tablet (5 mg total) by mouth 3 (three) times daily as needed for muscle  spasms. Patient not taking: Reported on 04/26/2017 01/12/17   Dettinger, Fransisca Kaufmann, MD  DULoxetine (CYMBALTA) 60 MG capsule TAKE 1 CAPSULE BY MOUTH DAILY Patient not taking: Reported on 04/26/2017 01/17/17   Dettinger, Fransisca Kaufmann, MD  fluticasone (FLONASE) 50 MCG/ACT nasal spray PLACE TWO   SPRAYS INTO BOTH NOSTRILS DAILY. 02/20/16   Funches, Adriana Mccallum, MD  Glucose Blood (BLOOD GLUCOSE TEST STRIPS) STRP 1 strip by In Vitro route 4 (four) times daily as needed. 11/11/16   Dettinger, Fransisca Kaufmann, MD  Insulin Syringe-Needle U-100 (B-D INS SYRINGE 0.5CC/31GX5/16) 31G X 5/16" 0.5 ML MISC 1 each by Does not apply route 3 (three) times daily. Patient taking differently: 1 each by Does not apply route 4 (four) times daily.  03/18/16   Funches, Adriana Mccallum, MD  metoprolol tartrate (LOPRESSOR) 25 MG tablet TAKE 1 TABLET BY MOUTH TWICE DAILY Patient not taking: Reported on 04/26/2017 02/22/17   Dettinger, Fransisca Kaufmann, MD  oseltamivir (TAMIFLU) 75 MG capsule Take 1 capsule (75 mg total) by mouth 2 (two) times daily. Patient not taking: Reported on 12/23/2016 11/24/16   Dettinger, Fransisca Kaufmann, MD  sitaGLIPtin (JANUVIA) 100 MG tablet Take 1 tablet (100 mg total) by mouth daily. Patient not taking: Reported on 04/26/2017 11/15/16   Dettinger, Fransisca Kaufmann, MD  triamcinolone cream (KENALOG) 0.1 % Apply 1 application topically 2 (two) times daily.    [provider]    Family History Family History  Problem Relation Age of Onset  . Heart disease Mother   . Heart disease Father   . Heart disease Maternal Grandmother   . Diabetes Maternal Grandmother     Social History Social History  Substance Use Topics  . Smoking status: Current Every Day Smoker    Packs/day: 1.50    Types: Cigarettes  . Smokeless tobacco: Never Used  . Alcohol use No     Comment: quit     Allergies   Quetiapine; Seroquel [quetiapine fumarate]; Trazodone; Codeine; Hydroxyzine; Trazodone and nefazodone; and Haldol [haloperidol lactate]   Review of  Systems Review of Systems  Constitutional:       Per HPI, otherwise negative  HENT:       Per HPI, otherwise negative  Respiratory:       Per HPI, otherwise negative  Cardiovascular:       Per HPI, otherwise negative  Gastrointestinal: Negative for vomiting.  Endocrine:       Negative aside from HPI  Genitourinary:       Neg aside from HPI   Musculoskeletal:       Per HPI, otherwise negative  Skin: Negative.   Neurological: Negative for syncope.  Psychiatric/Behavioral: Positive for decreased concentration, sleep disturbance and suicidal ideas. The patient is nervous/anxious.      Physical Exam Updated Vital Signs BP 108/89   Pulse 94   Temp 98.5 F (36.9 C)   Resp 18   Ht _0  (1.778 m)   Wt 99.3 kg (219 lb)   SpO2 97%   BMI 31.42 kg/m   Physical Exam  Constitutional: He is oriented to person, place, and time. He has a sickly appearance. No distress.  HENT:  Head: Normocephalic and atraumatic.  Eyes: Conjunctivae and EOM are normal.  Cardiovascular: Normal rate and regular rhythm.   Pulmonary/Chest: Effort normal. No stridor. No respiratory distress.  Abdominal: He exhibits no distension.  Musculoskeletal: He exhibits no edema.  Neurological: He is alert and oriented to person, place, and time.  Skin: Skin is warm and dry.  Psychiatric: He has a normal mood and affect. Cognition and memory are not impaired. He expresses suicidal ideation. He expresses suicidal plans.  Nursing note and vitals reviewed.    ED Treatments / Results  Labs (all labs ordered are listed, but only abnormal results are displayed) Labs Reviewed  CBC WITH DIFFERENTIAL/PLATELET - Abnormal; Notable for the following:       Result Value   MCHC 36.2 (*)    Platelets 139 (*)    All other components within normal limits  BASIC METABOLIC PANEL - Abnormal; Notable for the following:    Sodium 133 (*)    CO2 21 (*)    All other components within normal limits  ETHANOL  RAPID URINE DRUG  SCREEN, HOSP PERFORMED    Procedures Procedures (including critical care time)  Medications Ordered in ED Medications  clonazePAM (KLONOPIN) tablet 0.5 mg (0.5 mg Oral Given 04/26/17 1623)   This elderly  appearing male presents with concern of suicidal thoughts. Patient is awake and alert, but given his description of the intention to hang himself, the patient had medical evaluation, and was cleared for assistance from our psychiatric colleagues.   Initial Impression / Assessment and Plan / ED Course  I have reviewed the triage vital signs and the nursing notes.  Pertinent labs & imaging results that were available during my care of the patient were reviewed by me and considered in my medical decision making (see chart for details).  Final Clinical Impressions(s) / ED Diagnoses  Suicidal ideation   Carmin Muskrat, MD 04/26/17 1630

## 2017-04-27 DIAGNOSIS — F329 Major depressive disorder, single episode, unspecified: Secondary | ICD-10-CM | POA: Diagnosis not present

## 2017-04-27 LAB — CBG MONITORING, ED
GLUCOSE-CAPILLARY: 119 mg/dL — AB (ref 65–99)
Glucose-Capillary: 107 mg/dL — ABNORMAL HIGH (ref 65–99)
Glucose-Capillary: 119 mg/dL — ABNORMAL HIGH (ref 65–99)
Glucose-Capillary: 93 mg/dL (ref 65–99)
Glucose-Capillary: 165 mg/dL — ABNORMAL HIGH (ref 65–99)

## 2017-04-27 MED ORDER — TRAMADOL HCL 50 MG PO TABS
50.0000 mg | ORAL_TABLET | Freq: Three times a day (TID) | ORAL | Status: DC | PRN
  Administered 2017-04-27 – 2017-04-28 (×2): 50 mg via ORAL
  Filled 2017-04-27 (×2): qty 1

## 2017-04-27 MED ORDER — IBUPROFEN 400 MG PO TABS
600.0000 mg | ORAL_TABLET | Freq: Once | ORAL | Status: AC
  Administered 2017-04-27: 600 mg via ORAL
  Filled 2017-04-27: qty 2

## 2017-04-27 MED ORDER — ACETAMINOPHEN 325 MG PO TABS
ORAL_TABLET | ORAL | Status: AC
  Filled 2017-04-27: qty 2

## 2017-04-27 MED ORDER — ACETAMINOPHEN 325 MG PO TABS
650.0000 mg | ORAL_TABLET | Freq: Four times a day (QID) | ORAL | Status: DC | PRN
  Administered 2017-04-27 – 2017-04-28 (×3): 650 mg via ORAL
  Filled 2017-04-27 (×2): qty 2

## 2017-04-27 MED ORDER — HYDROXYZINE HCL 25 MG PO TABS: 50.0000 mg | ORAL_TABLET | Freq: Three times a day (TID) | ORAL | Status: DC | PRN

## 2017-04-27 MED ORDER — NICOTINE 21 MG/24HR TD PT24
MEDICATED_PATCH | TRANSDERMAL | Status: AC
  Administered 2017-04-27: 21 mg
  Filled 2017-04-27: qty 1

## 2017-04-27 NOTE — BHH Counselor (Signed)
Clinician received a call from [facility] at [facility] asking if the pt needed placement. Susie reported she will review the pt.  Vertell Novak, MS, [facility], [facility] Triage Specialist [phone]

## 2017-04-27 NOTE — ED Notes (Signed)
Pt c/o chronic pain issues, states pain in the hip and back area, offered patient tylenol as ordered, at this time the patient is refusing.

## 2017-04-27 NOTE — ED Notes (Signed)
Called [facility] for dulera inhaler.

## 2017-04-27 NOTE — ED Notes (Signed)
Pt refuses vistaril.  Requesting additional dose of Klonopin.

## 2017-04-27 NOTE — ED Notes (Signed)
Meal given

## 2017-04-27 NOTE — BH Assessment (Signed)
[facility] Assessment Progress Note   Pt reassessed by TTS. Pt continues to endorse SI with a plan to hang himself. Pt presented despondent during the assessment and states he lives in a rest home that he hates. Pt states that he feels everyone in the rest home is focused on making his life a living hell.  Lind Covert, MSW, [place] TTS Specialist [phone]

## 2017-04-28 DIAGNOSIS — F329 Major depressive disorder, single episode, unspecified: Secondary | ICD-10-CM | POA: Diagnosis not present

## 2017-04-28 LAB — CBG MONITORING, ED
GLUCOSE-CAPILLARY: 153 mg/dL — AB (ref 65–99)
Glucose-Capillary: 150 mg/dL — ABNORMAL HIGH (ref 65–99)
Glucose-Capillary: 71 mg/dL (ref 65–99)

## 2017-04-28 MED ORDER — CLONAZEPAM 0.5 MG PO TABS
1.0000 mg | ORAL_TABLET | Freq: Once | ORAL | Status: AC
  Administered 2017-04-28: 1 mg via ORAL
  Filled 2017-04-28: qty 2

## 2017-04-28 MED ORDER — GI COCKTAIL ~~LOC~~
30.0000 mL | Freq: Once | ORAL | Status: AC
  Administered 2017-04-28: 30 mL via ORAL
  Filled 2017-04-28: qty 30

## 2017-04-28 NOTE — ED Notes (Signed)
System downtime

## 2017-04-28 NOTE — ED Notes (Signed)
Spoke to [facility], states they will be here to pick patient up.

## 2017-04-28 NOTE — ED Provider Notes (Signed)
Pt has been in the ED since 7/10.  He initially presented with depression and SI.  He does not feel suicidal now.  He has been re evaluated by TTS and deemed stable for d/c.  I also asked him if he feels suicidal, and he said that he does not.   Isla Pence, MD 04/28/17 1357

## 2017-04-28 NOTE — ED Notes (Signed)
TTS in progress 

## 2017-04-28 NOTE — BH Assessment (Signed)
[facility] Assessment Progress Note  Pt reassessed today. He disclosed "I'm not feeling suicidal anymore. I'm feeling right good". Pt discussed with clinician his plans to get his ACTT team (w/ [facility]) to help him get into this boarding house he was interested in residing at. Pt presented as upbeat and pleasant. Pt requested to be d/c. Staffed case with Jinny Blossom, NP, and it was agreed that pt could be d/c back to his OP psych providers.  Dr. Gilford Raid made aware.   Kenna Gilbert. Lovena Le, [place], [place], [facility] Counselor

## 2017-04-28 NOTE — ED Notes (Signed)
Patient requesting for Gi-Cocktail. MD made aware.

## 2017-05-06 DIAGNOSIS — F17200 Nicotine dependence, unspecified, uncomplicated: Secondary | ICD-10-CM | POA: Diagnosis not present

## 2017-05-06 DIAGNOSIS — N289 Disorder of kidney and ureter, unspecified: Secondary | ICD-10-CM | POA: Diagnosis not present

## 2017-05-06 DIAGNOSIS — Z79899 Other long term (current) drug therapy: Secondary | ICD-10-CM | POA: Diagnosis not present

## 2017-05-06 DIAGNOSIS — E785 Hyperlipidemia, unspecified: Secondary | ICD-10-CM | POA: Diagnosis not present

## 2017-05-06 DIAGNOSIS — F29 Unspecified psychosis not due to a substance or known physiological condition: Secondary | ICD-10-CM | POA: Diagnosis not present

## 2017-05-06 DIAGNOSIS — I10 Essential (primary) hypertension: Secondary | ICD-10-CM | POA: Diagnosis not present

## 2017-05-06 DIAGNOSIS — J449 Chronic obstructive pulmonary disease, unspecified: Secondary | ICD-10-CM | POA: Diagnosis not present

## 2017-05-06 DIAGNOSIS — R45851 Suicidal ideations: Secondary | ICD-10-CM | POA: Diagnosis not present

## 2017-05-06 DIAGNOSIS — F23 Brief psychotic disorder: Secondary | ICD-10-CM | POA: Diagnosis not present

## 2017-05-06 DIAGNOSIS — Z794 Long term (current) use of insulin: Secondary | ICD-10-CM | POA: Diagnosis not present

## 2017-05-06 DIAGNOSIS — E119 Type 2 diabetes mellitus without complications: Secondary | ICD-10-CM | POA: Diagnosis not present

## 2017-05-06 DIAGNOSIS — R0602 Shortness of breath: Secondary | ICD-10-CM | POA: Diagnosis not present

## 2017-05-06 DIAGNOSIS — Z7982 Long term (current) use of aspirin: Secondary | ICD-10-CM | POA: Diagnosis not present

## 2017-05-06 DIAGNOSIS — Z7951 Long term (current) use of inhaled steroids: Secondary | ICD-10-CM | POA: Diagnosis not present

## 2017-05-08 DIAGNOSIS — I10 Essential (primary) hypertension: Secondary | ICD-10-CM | POA: Diagnosis not present

## 2017-05-08 DIAGNOSIS — Z794 Long term (current) use of insulin: Secondary | ICD-10-CM | POA: Diagnosis not present

## 2017-05-08 DIAGNOSIS — Z7951 Long term (current) use of inhaled steroids: Secondary | ICD-10-CM | POA: Diagnosis not present

## 2017-05-08 DIAGNOSIS — E785 Hyperlipidemia, unspecified: Secondary | ICD-10-CM | POA: Diagnosis not present

## 2017-05-08 DIAGNOSIS — J449 Chronic obstructive pulmonary disease, unspecified: Secondary | ICD-10-CM | POA: Diagnosis not present

## 2017-05-08 DIAGNOSIS — F259 Schizoaffective disorder, unspecified: Secondary | ICD-10-CM | POA: Diagnosis not present

## 2017-05-08 DIAGNOSIS — Z79899 Other long term (current) drug therapy: Secondary | ICD-10-CM | POA: Diagnosis not present

## 2017-05-08 DIAGNOSIS — Z7982 Long term (current) use of aspirin: Secondary | ICD-10-CM | POA: Diagnosis not present

## 2017-05-08 DIAGNOSIS — R69 Illness, unspecified: Secondary | ICD-10-CM | POA: Diagnosis not present

## 2017-05-08 DIAGNOSIS — F17200 Nicotine dependence, unspecified, uncomplicated: Secondary | ICD-10-CM | POA: Diagnosis not present

## 2017-05-08 DIAGNOSIS — E119 Type 2 diabetes mellitus without complications: Secondary | ICD-10-CM | POA: Diagnosis not present

## 2017-05-09 DIAGNOSIS — I10 Essential (primary) hypertension: Secondary | ICD-10-CM | POA: Diagnosis not present

## 2017-05-09 DIAGNOSIS — J449 Chronic obstructive pulmonary disease, unspecified: Secondary | ICD-10-CM | POA: Diagnosis not present

## 2017-05-09 DIAGNOSIS — N179 Acute kidney failure, unspecified: Secondary | ICD-10-CM | POA: Diagnosis not present

## 2017-05-09 DIAGNOSIS — K219 Gastro-esophageal reflux disease without esophagitis: Secondary | ICD-10-CM | POA: Diagnosis not present

## 2017-05-14 ENCOUNTER — Encounter (HOSPITAL_COMMUNITY): Payer: Self-pay | Admitting: Emergency Medicine

## 2017-05-14 ENCOUNTER — Emergency Department (HOSPITAL_COMMUNITY)
Admission: EM | Admit: 2017-05-14 | Discharge: 2017-05-16 | Disposition: A | Discharge status: Other Acute Inpatient Hospital | Payer: Medicare Other | Attending: Emergency Medicine | Admitting: Emergency Medicine

## 2017-05-14 DIAGNOSIS — F17210 Nicotine dependence, cigarettes, uncomplicated: Secondary | ICD-10-CM | POA: Insufficient documentation

## 2017-05-14 DIAGNOSIS — E119 Type 2 diabetes mellitus without complications: Secondary | ICD-10-CM | POA: Diagnosis not present

## 2017-05-14 DIAGNOSIS — Z79899 Other long term (current) drug therapy: Secondary | ICD-10-CM | POA: Diagnosis not present

## 2017-05-14 DIAGNOSIS — I10 Essential (primary) hypertension: Secondary | ICD-10-CM | POA: Diagnosis not present

## 2017-05-14 DIAGNOSIS — Z794 Long term (current) use of insulin: Secondary | ICD-10-CM | POA: Insufficient documentation

## 2017-05-14 DIAGNOSIS — J449 Chronic obstructive pulmonary disease, unspecified: Secondary | ICD-10-CM | POA: Insufficient documentation

## 2017-05-14 DIAGNOSIS — F329 Major depressive disorder, single episode, unspecified: Secondary | ICD-10-CM | POA: Diagnosis not present

## 2017-05-14 DIAGNOSIS — Z7982 Long term (current) use of aspirin: Secondary | ICD-10-CM | POA: Insufficient documentation

## 2017-05-14 DIAGNOSIS — R45851 Suicidal ideations: Secondary | ICD-10-CM | POA: Insufficient documentation

## 2017-05-14 DIAGNOSIS — I2510 Atherosclerotic heart disease of native coronary artery without angina pectoris: Secondary | ICD-10-CM | POA: Diagnosis not present

## 2017-05-14 DIAGNOSIS — F23 Brief psychotic disorder: Secondary | ICD-10-CM | POA: Diagnosis not present

## 2017-05-14 LAB — COMPREHENSIVE METABOLIC PANEL
ALK PHOS: 70 U/L (ref 38–126)
ALT: 15 U/L — AB (ref 17–63)
AST: 21 U/L (ref 15–41)
Albumin: 4.5 g/dL (ref 3.5–5.0)
Anion gap: 10 (ref 5–15)
BILIRUBIN TOTAL: 0.8 mg/dL (ref 0.3–1.2)
BUN: 6 mg/dL (ref 6–20)
CALCIUM: 9.5 mg/dL (ref 8.9–10.3)
CO2: 23 mmol/L (ref 22–32)
CREATININE: 1.02 mg/dL (ref 0.61–1.24)
Chloride: 99 mmol/L — ABNORMAL LOW (ref 101–111)
GFR calc Af Amer: >60 mL/min (ref >60)
GLUCOSE: 137 mg/dL — AB (ref 65–99)
Potassium: 4.4 mmol/L (ref 3.5–5.1)
Sodium: 132 mmol/L — ABNORMAL LOW (ref 135–145)
TOTAL PROTEIN: 7.3 g/dL (ref 6.5–8.1)

## 2017-05-14 LAB — CBC WITH DIFFERENTIAL/PLATELET
Basophils Absolute: 0 10*3/uL (ref 0.0–0.1)
Basophils Relative: 1 %
Eosinophils Absolute: 0.2 10*3/uL (ref 0.0–0.7)
Eosinophils Relative: 4 %
HCT: 45.7 % (ref 39.0–52.0)
HEMOGLOBIN: 16.4 g/dL (ref 13.0–17.0)
LYMPHS ABS: 1.7 10*3/uL (ref 0.7–4.0)
LYMPHS PCT: 30 %
MCH: 32.7 pg (ref 26.0–34.0)
MCHC: 35.9 g/dL (ref 30.0–36.0)
MCV: 91.2 fL (ref 78.0–100.0)
Monocytes Absolute: 0.6 10*3/uL (ref 0.1–1.0)
Monocytes Relative: 10 %
NEUTROS PCT: 55 %
Neutro Abs: 3.3 10*3/uL (ref 1.7–7.7)
PLATELETS: 131 10*3/uL — AB (ref 150–400)
RBC: 5.01 MIL/uL (ref 4.22–5.81)
RDW: 13 % (ref 11.5–15.5)
WBC: 5.8 10*3/uL (ref 4.0–10.5)

## 2017-05-14 LAB — SALICYLATE LEVEL: Salicylate Lvl: <7 mg/dL (ref 2.8–30.0)

## 2017-05-14 LAB — RAPID URINE DRUG SCREEN, HOSP PERFORMED
Amphetamines: NOT DETECTED
BARBITURATES: NOT DETECTED
Benzodiazepines: NOT DETECTED
Cocaine: NOT DETECTED
Opiates: NOT DETECTED
Tetrahydrocannabinol: NOT DETECTED

## 2017-05-14 LAB — CBG MONITORING, ED: GLUCOSE-CAPILLARY: 158 mg/dL — AB (ref 65–99)

## 2017-05-14 LAB — ACETAMINOPHEN LEVEL

## 2017-05-14 LAB — ETHANOL

## 2017-05-14 MED ORDER — ATORVASTATIN CALCIUM 40 MG PO TABS
40.0000 mg | ORAL_TABLET | Freq: Every day | ORAL | Status: DC
  Administered 2017-05-15: 40 mg via ORAL
  Filled 2017-05-14 (×2): qty 1

## 2017-05-14 MED ORDER — TAMSULOSIN HCL 0.4 MG PO CAPS
0.4000 mg | ORAL_CAPSULE | Freq: Every day | ORAL | Status: DC
  Administered 2017-05-14 – 2017-05-15 (×2): 0.4 mg via ORAL
  Filled 2017-05-14 (×2): qty 1

## 2017-05-14 MED ORDER — ZOLPIDEM TARTRATE 5 MG PO TABS
10.0000 mg | ORAL_TABLET | Freq: Every evening | ORAL | Status: DC | PRN
  Administered 2017-05-14 – 2017-05-15 (×2): 10 mg via ORAL
  Filled 2017-05-14 (×2): qty 2

## 2017-05-14 MED ORDER — ONDANSETRON 4 MG PO TBDP: 4.0000 mg | ORAL_TABLET | Freq: Four times a day (QID) | ORAL | Status: DC | PRN

## 2017-05-14 MED ORDER — ACETAMINOPHEN 325 MG PO TABS
650.0000 mg | ORAL_TABLET | Freq: Three times a day (TID) | ORAL | Status: DC | PRN
  Administered 2017-05-15 – 2017-05-16 (×3): 650 mg via ORAL
  Filled 2017-05-14 (×5): qty 2

## 2017-05-14 MED ORDER — PANTOPRAZOLE SODIUM 40 MG PO TBEC
40.0000 mg | DELAYED_RELEASE_TABLET | Freq: Every day | ORAL | Status: DC
  Administered 2017-05-14 – 2017-05-15 (×2): 40 mg via ORAL
  Filled 2017-05-14 (×2): qty 1

## 2017-05-14 MED ORDER — GABAPENTIN 800 MG PO TABS
800.0000 mg | ORAL_TABLET | Freq: Every day | ORAL | Status: DC
  Filled 2017-05-14 (×10): qty 1

## 2017-05-14 MED ORDER — INSULIN ASPART 100 UNIT/ML ~~LOC~~ SOLN
14.0000 [IU] | Freq: Every day | SUBCUTANEOUS | Status: DC
  Administered 2017-05-15: 14 [IU] via SUBCUTANEOUS
  Filled 2017-05-14: qty 1

## 2017-05-14 MED ORDER — INSULIN NPH (HUMAN) (ISOPHANE) 100 UNIT/ML ~~LOC~~ SUSP
20.0000 [IU] | Freq: Every day | SUBCUTANEOUS | Status: DC
  Administered 2017-05-15 – 2017-05-16 (×2): 20 [IU] via SUBCUTANEOUS
  Filled 2017-05-14: qty 10

## 2017-05-14 MED ORDER — HYDROXYZINE PAMOATE 50 MG PO CAPS
50.0000 mg | ORAL_CAPSULE | Freq: Three times a day (TID) | ORAL | Status: DC | PRN
Start: 2017-05-14 — End: 2017-05-14

## 2017-05-14 MED ORDER — INSULIN ASPART 100 UNIT/ML ~~LOC~~ SOLN
20.0000 [IU] | Freq: Every day | SUBCUTANEOUS | Status: DC
  Administered 2017-05-15 – 2017-05-16 (×2): 20 [IU] via SUBCUTANEOUS
  Filled 2017-05-14 (×2): qty 1

## 2017-05-14 MED ORDER — ASPIRIN EC 81 MG PO TBEC
81.0000 mg | DELAYED_RELEASE_TABLET | Freq: Every day | ORAL | Status: DC
  Administered 2017-05-14 – 2017-05-15 (×2): 81 mg via ORAL
  Filled 2017-05-14 (×2): qty 1

## 2017-05-14 MED ORDER — INSULIN NPH (HUMAN) (ISOPHANE) 100 UNIT/ML ~~LOC~~ SUSP
14.0000 [IU] | Freq: Every day | SUBCUTANEOUS | Status: DC
  Filled 2017-05-14: qty 10

## 2017-05-14 MED ORDER — DOCUSATE SODIUM 100 MG PO CAPS
100.0000 mg | ORAL_CAPSULE | Freq: Two times a day (BID) | ORAL | Status: DC
  Administered 2017-05-14 – 2017-05-15 (×3): 100 mg via ORAL
  Filled 2017-05-14 (×2): qty 1

## 2017-05-14 MED ORDER — METFORMIN HCL ER 500 MG PO TB24
500.0000 mg | ORAL_TABLET | Freq: Two times a day (BID) | ORAL | Status: DC
  Administered 2017-05-16: 500 mg via ORAL
  Filled 2017-05-14 (×2): qty 1

## 2017-05-14 MED ORDER — FLUTICASONE PROPIONATE 50 MCG/ACT NA SUSP
2.0000 | Freq: Every day | NASAL | Status: DC
  Filled 2017-05-14 (×3): qty 16

## 2017-05-14 MED ORDER — MOMETASONE FURO-FORMOTEROL FUM 100-5 MCG/ACT IN AERO
2.0000 | INHALATION_SPRAY | Freq: Two times a day (BID) | RESPIRATORY_TRACT | Status: DC
  Administered 2017-05-15 – 2017-05-16 (×3): 2 via RESPIRATORY_TRACT
  Filled 2017-05-14 (×3): qty 8.8

## 2017-05-14 MED ORDER — GABAPENTIN 400 MG PO CAPS
ORAL_CAPSULE | ORAL | Status: AC
  Administered 2017-05-14: 800 mg via ORAL
  Filled 2017-05-14: qty 2

## 2017-05-14 MED ORDER — LISINOPRIL 10 MG PO TABS
20.0000 mg | ORAL_TABLET | Freq: Every day | ORAL | Status: DC
  Administered 2017-05-14 – 2017-05-15 (×2): 20 mg via ORAL
  Filled 2017-05-14 (×2): qty 2

## 2017-05-14 MED ORDER — CLONAZEPAM 0.5 MG PO TABS
1.0000 mg | ORAL_TABLET | Freq: Two times a day (BID) | ORAL | Status: DC
  Administered 2017-05-14 – 2017-05-16 (×5): 1 mg via ORAL
  Filled 2017-05-14 (×5): qty 2

## 2017-05-14 MED ORDER — ISOSORBIDE MONONITRATE ER 30 MG PO TB24
30.0000 mg | ORAL_TABLET | Freq: Every day | ORAL | Status: DC
  Administered 2017-05-15: 30 mg via ORAL
  Filled 2017-05-14 (×3): qty 1

## 2017-05-14 NOTE — Progress Notes (Signed)
Patient refused inhaler and nose spray states he does not take -- "just wants his [place]"

## 2017-05-14 NOTE — ED Triage Notes (Signed)
Per EMS pt is a resident of [facility].  Pt states that he is not safe and has thoughts of suicidal ideation.  Pt states that he has a plan, but does not have any way of performing the plan, pt is alert calm and cooperative.

## 2017-05-14 NOTE — BH Assessment (Addendum)
Faxed clinical information to the following facilities for placement:  [facility], [facility], [facility], [facility] Triage Specialist [phone]

## 2017-05-14 NOTE — ED Provider Notes (Signed)
[facility] DEPT Provider Note   CSN: [id number] Arrival date & time: 05/14/17  1649     History   Chief Complaint Chief Complaint  Patient presents with  . V70.1    HPI Daryel L Mikes is a 56 y.o. male.  Patient not willing to expand [place] on his suicidal ideation. He states he does want to hurt himself has a plan will not tell me what it is. States he's had symptoms like this before. He is at increased anxiety. Increased depression. No homicidal ideation. He does have quite a bit of paranoia on interview and maybe even delusional.      Past Medical History:  Diagnosis Date  . Alcohol dependence ([place]) 1979   stated abusing ETOH at age 81   . Back pain   . Benzodiazepine dependence ([place])   . Cardiac arrest ([place])   . Chronic back pain   . Cocaine abuse   . COPD (chronic obstructive pulmonary disease) ([place])   . Diabetes mellitus without complication ([place]) 8657  . Hypertension 2008  . MI (myocardial infarction) ([place])   . Non-compliance   . Schizoaffective disorder ([place]) 2006   . Substance abuse     Patient Active Problem List   Diagnosis Date Noted  . Diabetic neuropathy ([place]) 11/11/2016  . Anxiety and depression 11/11/2016  . Chronic insomnia 01/08/2016  . Alcohol use disorder ([place]) 12/10/2015  . Benzodiazepine abuse 12/10/2015  . Opioid abuse 12/10/2015  . Constipation 02/06/2015  . BPH (benign prostatic hyperplasia) 01/16/2015  . Vitamin D deficiency 01/16/2015  . Allergic rhinitis 11/19/2014  . Chronic low back pain 11/07/2014  . Onychomycosis of toenail 11/07/2014  . Tinea pedis 11/07/2014  . Psoriasis 10/14/2014  . HTN (hypertension) 10/14/2014  . Diabetes mellitus type 2, controlled ([place]) 10/14/2014  . COPD (chronic obstructive pulmonary disease) ([place]) 10/14/2014  . Tobacco abuse 10/14/2014  . HLD (hyperlipidemia) 10/14/2014  . Schizoaffective disorder, depressive type ([place]) 10/14/2014    Past Surgical History:  Procedure Laterality Date  .  CYST EXCISION         Home Medications    Prior to Admission medications   Medication Sig Start Date End Date Taking? Authorizing Provider  acetaminophen (TYLENOL 8 HOUR) 650 MG CR tablet Take 1 tablet (650 mg total) by mouth every 8 (eight) hours as needed for pain. Patient not taking: Reported on 04/26/2017 11/12/16   Dettinger, Fransisca Kaufmann, MD  albuterol (PROVENTIL HFA;VENTOLIN HFA) 108 (90 Base) MCG/ACT inhaler Inhale 2 puffs into the lungs every 6 (six) hours as needed for wheezing or shortness of breath. Patient taking differently: Inhale 2 puffs into the lungs every 4 (four) hours as needed for wheezing or shortness of breath.  11/11/16   Dettinger, Fransisca Kaufmann, MD  aspirin EC 81 MG tablet Take 1 tablet (81 mg total) by mouth daily. Reported on 01/02/2016 02/16/16   Boykin Nearing, MD  atorvastatin (LIPITOR) 40 MG tablet TAKE 1 TABLET BY MOUTH EVERY DAY - EMERGENCY REFILL FAXED DR 12/17/16   Dettinger, Fransisca Kaufmann, MD  Blood Glucose Monitoring Suppl (BLOOD GLUCOSE MONITOR SYSTEM) w/Device KIT 1 each by Does not apply route daily. 11/11/16   Dettinger, Fransisca Kaufmann, MD  cetirizine (ZYRTEC) 10 MG tablet Take 1 tablet (10 mg total) by mouth daily. Patient not taking: Reported on 04/26/2017 02/20/16   Boykin Nearing, MD  clonazePAM (KLONOPIN) 1 MG tablet Take 1 mg by mouth 2 (two) times daily.    [provider]  cyclobenzaprine (FLEXERIL) 5 MG  tablet Take 1 tablet (5 mg total) by mouth 3 (three) times daily as needed for muscle spasms. Patient not taking: Reported on 04/26/2017 01/12/17   Dettinger, Fransisca Kaufmann, MD  dextromethorphan-guaiFENesin [facility] DM) 30-600 MG 12hr tablet Take 2 tablets by mouth 2 (two) times daily.    [provider]  docusate sodium (COLACE) 100 MG capsule Take 100 mg by mouth 2 (two) times daily.    [provider]  DULoxetine (CYMBALTA) 60 MG capsule TAKE 1 CAPSULE BY MOUTH DAILY Patient not taking: Reported on 04/26/2017 01/17/17   Dettinger, Fransisca Kaufmann, MD    fluticasone (FLONASE) 50 MCG/ACT nasal spray PLACE TWO   SPRAYS INTO BOTH NOSTRILS DAILY. 02/20/16   Funches, Adriana Mccallum, MD  Fluticasone-Salmeterol (ADVAIR) 100-50 MCG/DOSE AEPB Inhale 1 puff into the lungs 2 (two) times daily. Patient taking differently: Inhale 2 puffs into the lungs 2 (two) times daily.  11/11/16   Dettinger, Fransisca Kaufmann, MD  gabapentin (NEURONTIN) 800 MG tablet Take 1 tablet (800 mg total) by mouth 5 (five) times daily. 12/13/16   Dettinger, Fransisca Kaufmann, MD  Glucose Blood (BLOOD GLUCOSE TEST STRIPS) STRP 1 strip by In Vitro route 4 (four) times daily as needed. 11/11/16   Dettinger, Fransisca Kaufmann, MD  hydrOXYzine (VISTARIL) 50 MG capsule Take 50 mg by mouth 3 (three) times daily as needed for itching.     [provider]  insulin aspart (NOVOLOG FLEXPEN) 100 UNIT/ML FlexPen Inject 14-20 Units into the skin 2 (two) times daily before a meal. Inject 20 units in the morning, and inject 14 units before dinner    [provider]  insulin NPH Human (HUMULIN N,NOVOLIN N) 100 UNIT/ML injection Inject 14-20 Units into the skin 2 (two) times daily before a meal. Inject 20 units in the morning, and inject 14 units 15 minutes before bedtime    [provider]  Insulin Syringe-Needle U-100 (B-D INS SYRINGE 0.5CC/31GX5/16) 31G X 5/16" 0.5 ML MISC 1 each by Does not apply route 3 (three) times daily. Patient taking differently: 1 each by Does not apply route 4 (four) times daily.  03/18/16   Funches, Adriana Mccallum, MD  isosorbide mononitrate (IMDUR) 30 MG 24 hr tablet Take 1 tablet (30 mg total) by mouth daily. Reported on 01/02/2016 02/20/16   Boykin Nearing, MD  lisinopril (PRINIVIL,ZESTRIL) 20 MG tablet Take 1 tablet (20 mg total) by mouth daily. 12/13/16   Dettinger, Fransisca Kaufmann, MD  metFORMIN (GLUCOPHAGE-XR) 500 MG 24 hr tablet Take 500 mg by mouth 2 (two) times daily.    [provider]  metoprolol tartrate (LOPRESSOR) 25 MG tablet TAKE 1 TABLET BY MOUTH TWICE DAILY Patient not  taking: Reported on 04/26/2017 02/22/17   Dettinger, Fransisca Kaufmann, MD  omeprazole (PRILOSEC) 20 MG capsule Take 20 mg by mouth daily.    [provider]  ondansetron (ZOFRAN-ODT) 4 MG disintegrating tablet Take 4 mg by mouth every 6 (six) hours as needed for nausea or vomiting.    [provider]  oseltamivir (TAMIFLU) 75 MG capsule Take 1 capsule (75 mg total) by mouth 2 (two) times daily. Patient not taking: Reported on 12/23/2016 11/24/16   Dettinger, Fransisca Kaufmann, MD  sitaGLIPtin (JANUVIA) 100 MG tablet Take 1 tablet (100 mg total) by mouth daily. Patient not taking: Reported on 04/26/2017 11/15/16   Dettinger, Fransisca Kaufmann, MD  tamsulosin (FLOMAX) 0.4 MG CAPS capsule Take 0.4 mg by mouth daily.    [provider]  traMADol-acetaminophen (ULTRACET) 37.5-325 MG tablet Take 1  tablet by mouth every 8 (eight) hours as needed.    [provider]  triamcinolone cream (KENALOG) 0.1 % Apply 1 application topically 2 (two) times daily.    [provider]  zolpidem (AMBIEN) 10 MG tablet Take 10 mg by mouth at bedtime as needed for sleep.    [provider]    Family History Family History  Problem Relation Age of Onset  . Heart disease Mother   . Heart disease Father   . Heart disease Maternal Grandmother   . Diabetes Maternal Grandmother     Social History Social History  Substance Use Topics  . Smoking status: Current Every Day Smoker    Packs/day: 1.50    Types: Cigarettes  . Smokeless tobacco: Never Used  . Alcohol use No     Comment: quit     Allergies   Quetiapine; Seroquel [quetiapine fumarate]; Trazodone; Codeine; Hydroxyzine; Trazodone and nefazodone; and Haldol [haloperidol lactate]   Review of Systems Review of Systems  All other systems reviewed and are negative.    Physical Exam Updated Vital Signs BP (!) 155/95 (BP Location: Right Arm)   Pulse 88   Temp 97.8 F (36.6 C) (Oral)   Resp 18   Ht '5\' 10"'  (1.778 m)   Wt 97.5 kg  (215 lb)   SpO2 96%   BMI 30.85 kg/m   Physical Exam  Constitutional: He is oriented to person, place, and time. He appears well-developed and well-nourished.  HENT:  Head: Normocephalic and atraumatic.  Eyes: Conjunctivae and EOM are normal.  Neck: Normal range of motion.  Cardiovascular: Normal rate.   Pulmonary/Chest: Effort normal. No respiratory distress. He has wheezes.  Abdominal: Soft. He exhibits no distension.  Musculoskeletal: Normal range of motion. He exhibits no edema or deformity.  Neurological: He is alert and oriented to person, place, and time. No cranial nerve deficit. Coordination normal.  Skin: Skin is warm and dry.  Nursing note and vitals reviewed.    ED Treatments / Results  Labs (all labs ordered are listed, but only abnormal results are displayed) Labs Reviewed  COMPREHENSIVE METABOLIC PANEL  ETHANOL  RAPID URINE DRUG SCREEN, HOSP PERFORMED  CBC WITH DIFFERENTIAL/PLATELET  ACETAMINOPHEN LEVEL  SALICYLATE LEVEL    EKG  EKG Interpretation None       Radiology No results found.  Procedures Procedures (including critical care time)  Medications Ordered in ED Medications - No data to display   Initial Impression / Assessment and Plan / ED Course  I have reviewed the triage vital signs and the nursing notes.  Pertinent labs & imaging results that were available during my care of the patient were reviewed by me and considered in my medical decision making (see chart for details).    Medically cleared, will consult TTS for further recommendations. Home medications ordered.    Final Clinical Impressions(s) / ED Diagnoses   Final diagnoses:  Suicidal thoughts      Kaylene Dawn, Corene Cornea, MD 05/14/17 [phone]

## 2017-05-14 NOTE — BH Assessment (Addendum)
Tele Assessment Note   [name] is an 56 y.o. divorced male who presents unaccompanied to [facility] ED stating he is "hurting physically and mentally." Pt has a history of schizoaffective disorder and doesn't believe his psychiatric medications are effective and also doesn't trust his psychiatrist, stating "she is playing psychology tricks on me and makes me say what she wants me to say." Pt says he feels "confused" and believes he is being "poisoned" and "brainwashed." Pt says he hears people in other rooms talking about him. He says the staff at his nursing home are talking about him and communicating with different colored cars that drive by the facility. Pt says he is grieving his mother and wants to go to heaven to be with her. He says he wants to hang himself but doesn't have access to a rope. Pt acknowledges symptoms including crying spells, loss of interest in usual pleasures, fatigue, irritability, decreased concentration, decreased sleep, decreased appetite and feelings of fear and hopelessness. Pt reports he has attempted suicide "about 120 times" in the past. He denies intentional self-injurious behavior. He denies homicidal ideation or history of aggression. He denies visual hallucinations. He reports a long history of alcohol and cocaine abuse but he has not used either in several years.  Pt identifies his medical and physical decline as his primary stressor. He says he lives at [facility] but is considering moving to a group home off [address]. Pt asks if staff at [facility] could refer him to hospice because he wants to die. Pt states he has no family or friends who are supportive. He denies any history of abuse. He denies current legal problems.  Pt reports he is currently receiving outpatient medication management with Dr. Pattricia Boss at [facility]. He says he is compliant with medication but feels his Invega injection is making him more confused. Pt says he wants to be prescribed  Klonopin "because it gives me peace of mind." Pt says he used to receive ACTT services but they no longer assist him. Pt reports he has been psychiatrically hospitalized several times in the past. Pt's medical record indicates he was last psychiatrically hospitalized at [facility] in March 2018.   Pt is dressed in hospital scrubs, alert, oriented to person, place and situation but not date. He has normal speech and normal motor behavior. Eye contact is good. Pt's mood is depressed, anxious and fearful and affect is congruent with mood. Thought process is coherent with paranoid and delusional content. Pt says he wants to be admitted to a psychiatric facility.    Diagnosis: Schizoaffective Disorder  Past Medical History:  Past Medical History:  Diagnosis Date  . Alcohol dependence ([place]) 1979   stated abusing ETOH at age 23   . Back pain   . Benzodiazepine dependence ([place])   . Cardiac arrest ([place])   . Chronic back pain   . Cocaine abuse   . COPD (chronic obstructive pulmonary disease) ([place])   . Diabetes mellitus without complication ([place]) 9833  . Hypertension 2008  . MI (myocardial infarction) ([place])   . Non-compliance   . Schizoaffective disorder ([place]) 2006   . Substance abuse     Past Surgical History:  Procedure Laterality Date  . CYST EXCISION      Family History:  Family History  Problem Relation Age of Onset  . Heart disease Mother   . Heart disease Father   . Heart disease Maternal Grandmother   . Diabetes Maternal Grandmother  Social History:  reports that he has been smoking Cigarettes.  He has been smoking about 1.50 packs per day. He has never used smokeless tobacco. He reports that he does not drink alcohol or use drugs.  Additional Social History:  Alcohol / Drug Use Pain Medications: See MAR Prescriptions: See MAR Over the Counter: See MAR History of alcohol / drug use?: Yes (Pt has history of using alcohol, cocaine and other drugs. Pt denies  use in several years.) Longest period of sobriety (when/how long): 4 to 5 years  CIWA: CIWA-Ar BP: (!) 155/95 Pulse Rate: 88 COWS:    PATIENT STRENGTHS: (choose at least two) Ability for insight Average or above average intelligence Communication skills Motivation for treatment/growth  Allergies:  Allergies  Allergen Reactions  . Quetiapine Anaphylaxis  . Seroquel [Quetiapine Fumarate] Anxiety and Other (See Comments)    Reaction:  Nightmares   . Trazodone Anaphylaxis  . Codeine Itching and Other (See Comments)  . Hydroxyzine Other (See Comments)    Pt unable to explain  . Trazodone And Nefazodone Anxiety and Other (See Comments)    Other reaction(s): Unknown Nightmares Reaction:  Nightmares   . Haldol [Haloperidol Lactate] Anxiety and Other (See Comments)    Reaction:  Nightmares     Home Medications:  (Not in a hospital admission)  OB/GYN Status:  No LMP for male patient.  General Assessment Data Location of Assessment: [facility] TTS Assessment: In system Is this a Tele or Face-to-Face Assessment?: Tele Assessment Is this an Initial Assessment or a Re-assessment for this encounter?: Initial Assessment Marital status: Divorced Coy name: NA Is patient pregnant?: No Pregnancy Status: No Living Arrangements: Other (Comment) ([facility]) Can pt return to current living arrangement?: Yes Admission Status: Voluntary Is patient capable of signing voluntary admission?: Yes Referral Source: Self/Family/Friend Insurance type: Medicare     Crisis Care Plan Living Arrangements: Other (Comment) ([facility]) Legal Guardian:  (Self) Name of Psychiatrist: Dr. Pattricia Boss Name of Therapist: None  Education Status Is patient currently in school?: No Current Grade: NA Highest grade of school patient has completed: 9 Name of school: NA Contact person: NA  Risk to self with the past 6 months Suicidal Ideation: Yes-Currently Present Has patient  been a risk to self within the past 6 months prior to admission? : Yes Suicidal Intent: Yes-Currently Present Has patient had any suicidal intent within the past 6 months prior to admission? : Yes Is patient at risk for suicide?: Yes Suicidal Plan?: Yes-Currently Present Has patient had any suicidal plan within the past 6 months prior to admission? : Yes Specify Current Suicidal Plan: Plan to hang himself Access to Means: No Specify Access to Suicidal Means: Pt says he doesn't have access to rope What has been your use of drugs/alcohol within the last 12 months?: No recent use Previous Attempts/Gestures: Yes How many times?: 10 Other Self Harm Risks: None Triggers for Past Attempts: Unknown Intentional Self Injurious Behavior: None Family Suicide History: Unknown Recent stressful life event(s): Recent negative physical changes (Medical problems) Persecutory voices/beliefs?: Yes Depression: Yes Depression Symptoms: Despondent, Insomnia, Tearfulness, Isolating, Fatigue, Guilt, Loss of interest in usual pleasures, Feeling worthless/self pity, Feeling angry/irritable Substance abuse history and/or treatment for substance abuse?: Yes Suicide prevention information given to non-admitted patients: Not applicable  Risk to Others within the past 6 months Homicidal Ideation: No Does patient have any lifetime risk of violence toward others beyond the six months prior to admission? : No Thoughts of Harm  to Others: No Current Homicidal Intent: No Current Homicidal Plan: No Access to Homicidal Means: No Identified Victim: None History of harm to others?: No Assessment of Violence: None Noted Violent Behavior Description: Pt denies history of violence Does patient have access to weapons?: No Criminal Charges Pending?: No Does patient have a court date: No Is patient on probation?: No  Psychosis Hallucinations: Auditory (Reports hearing people talk about him) Delusions: Persecutory (Pt  describes paranoid delusions)  Mental Status Report Appearance/Hygiene: In scrubs Eye Contact: Good Motor Activity: Unremarkable Speech: Logical/coherent Level of Consciousness: Alert Mood: Anxious, Depressed, Helpless Affect: Anxious Anxiety Level: Moderate Thought Processes: Coherent Judgement: Impaired Orientation: Person, Place, Time, Situation Obsessive Compulsive Thoughts/Behaviors: None  Cognitive Functioning Concentration: Fair Memory: Recent Intact, Remote Impaired IQ: Average Insight: Poor Impulse Control: Poor Appetite: Fair Weight Loss: 0 Weight Gain: 0 Sleep: Decreased Total Hours of Sleep: 4 Vegetative Symptoms: None  ADLScreening [facility] Assessment Services) Patient's cognitive ability adequate to safely complete daily activities?: Yes Patient able to express need for assistance with ADLs?: Yes Independently performs ADLs?: Yes (appropriate for developmental age)  Prior Inpatient Therapy Prior Inpatient Therapy: Yes Prior Therapy Dates: 12/2016 Prior Therapy Facilty/Provider(s): [facility] Reason for Treatment: SI  Prior Outpatient Therapy Prior Outpatient Therapy: Yes Prior Therapy Dates: Current Prior Therapy Facilty/Provider(s): Dr. Pattricia Boss Reason for Treatment: Schizophrenia Does patient have an ACCT team?: Unknown Does patient have Intensive In-House Services?  : No Does patient have [facility] services? : No Does patient have P4CC services?: No  ADL Screening (condition at time of admission) Patient's cognitive ability adequate to safely complete daily activities?: Yes Is the patient deaf or have difficulty hearing?: No Does the patient have difficulty seeing, even when wearing glasses/contacts?: No Does the patient have difficulty concentrating, remembering, or making decisions?: No Patient able to express need for assistance with ADLs?: Yes Does the patient have difficulty dressing or bathing?: No Independently performs ADLs?: Yes  (appropriate for developmental age) Does the patient have difficulty walking or climbing stairs?: No Weakness of Legs: None Weakness of Arms/Hands: None       Abuse/Neglect Assessment (Assessment to be complete while patient is alone) Physical Abuse: Denies Verbal Abuse: Denies Sexual Abuse: Denies Exploitation of patient/patient's resources: Denies Self-Neglect: Denies     Regulatory affairs officer (For Healthcare) Does Patient Have a Medical Advance Directive?: No Would patient like information on creating a medical advance directive?: No - Patient declined    Additional Information 1:1 In Past 12 Months?: No CIRT Risk: No Elopement Risk: No Does patient have medical clearance?: No     Disposition: Lavell Luster, AC at [facility], confirmed adult unit is at capacity. Gave clinical report to Lindon Romp, NP who said Pt meets criteria for inpatient psychiatric treatment. TTS will contact facilities for placement. Notified Dr. Merrily Pew and RN at [facility] of recommendation.  Disposition Initial Assessment Completed for this Encounter: Yes Disposition of Patient: Other dispositions Other disposition(s): Other (Comment)   Evelena Peat, [facility], [facility], [facility] Triage Specialist [phone]   Anson Fret, Orpah Greek 05/14/2017 7:41 PM

## 2017-05-15 ENCOUNTER — Encounter (HOSPITAL_COMMUNITY): Payer: Self-pay | Admitting: Emergency Medicine

## 2017-05-15 DIAGNOSIS — F329 Major depressive disorder, single episode, unspecified: Secondary | ICD-10-CM | POA: Diagnosis not present

## 2017-05-15 LAB — CBG MONITORING, ED
GLUCOSE-CAPILLARY: 127 mg/dL — AB (ref 65–99)
GLUCOSE-CAPILLARY: 100 mg/dL — AB (ref 65–99)
Glucose-Capillary: 149 mg/dL — ABNORMAL HIGH (ref 65–99)

## 2017-05-15 MED ORDER — GABAPENTIN 400 MG PO CAPS
800.0000 mg | ORAL_CAPSULE | Freq: Every day | ORAL | Status: DC
  Administered 2017-05-14 – 2017-05-16 (×7): 800 mg via ORAL
  Filled 2017-05-15 (×5): qty 2

## 2017-05-15 MED ORDER — DIPHENHYDRAMINE HCL 25 MG PO CAPS
25.0000 mg | ORAL_CAPSULE | Freq: Once | ORAL | Status: AC
  Administered 2017-05-15: 25 mg via ORAL
  Filled 2017-05-15: qty 1

## 2017-05-15 MED ORDER — HYDROCODONE-ACETAMINOPHEN 5-325 MG PO TABS
1.0000 | ORAL_TABLET | Freq: Once | ORAL | Status: AC
  Administered 2017-05-15: 1 via ORAL
  Filled 2017-05-15: qty 1

## 2017-05-15 MED ORDER — GABAPENTIN 400 MG PO CAPS
ORAL_CAPSULE | ORAL | Status: AC
  Filled 2017-05-15: qty 1

## 2017-05-15 MED ORDER — GABAPENTIN 400 MG PO CAPS
ORAL_CAPSULE | ORAL | Status: AC
  Administered 2017-05-15: 800 mg via ORAL
  Filled 2017-05-15: qty 2

## 2017-05-15 NOTE — ED Notes (Signed)
Pt requesting anxiety meds

## 2017-05-15 NOTE — ED Notes (Signed)
Pt insists that his blood sugar is low  Blood sugar checked at 100  Pt asked for and received coffee  Pt has been manipulative this shift in his demands for anti anxiety meds, pain meds, complaints of pain and refusing meds that are offered say he received norco last night

## 2017-05-15 NOTE — ED Notes (Signed)
Dr Lenna Sciara in to discuss with pt his report of anxiety Pt is allergic to Visteril per pt Dr Lenna Sciara offered benadryl for anxiety- pt declined

## 2017-05-15 NOTE — Progress Notes (Signed)
Gave patient dulera mdi, which he took wrong after being explaining  how to use inhaler. Refused to rinse mouth and rinse with water, wanted coffee or drink. Patient is wheezing on right side. He is being very demanding and manipulative. Did not try to get him to take albuterol neb for wheezes.

## 2017-05-15 NOTE — ED Notes (Signed)
Pt given tylenol 650mg   Po dropped one med and another tylenol removed from pixis  Pt reports he was given a norco last night and is nonplussed that he is not receiving another  He reports that he lived with his mother until she died and he lost his home   Has been a [place] for 4 months and will not return saying "they are trying to kill me just like you are"

## 2017-05-15 NOTE — ED Provider Notes (Addendum)
Patient crusting medicine for anxiety. He states he can't take this wrote because "it makes me sick to my stomach". I explained that that is not an allergy. He is advised that he has history of benzodiazepine dependence and will not be prescribed benzodiazepines. Patient appears calm and in no distress. He refuses other anxiolytics   [name], MD 05/15/17 1737 6:30 PM requesting Benadryl for anxiety. Oral Benadryl ordered by me   [name], MD 05/15/17 [phone]

## 2017-05-15 NOTE — ED Notes (Signed)
Pt with hx of diagnosed MI- requested narcotic pain meds per report

## 2017-05-15 NOTE — ED Notes (Signed)
Pt has eaten 100 percent of meal  Requesting med for pain

## 2017-05-15 NOTE — BH Assessment (Signed)
[facility] Assessment Progress Note  Clinician completed reassessment this morning. Pt reports current SI. "I think about it everyday but its not as bad when I have my nerve pill. I can control it then." When asked about a plan, he states "if I had a chair and a rope I would." He denies HI and AVH. He states "I think other people want to kill me." He reports he slept better last night.    [place] MSW, LCSW  05/15/2017 .9:30 AM

## 2017-05-15 NOTE — ED Notes (Signed)
Pt has multiple complaints of pain and states he was given norco last pm- He is informed he has been given pain meds He is watching TV moves ad lib has eaten a full lunch and appears in NAD

## 2017-05-15 NOTE — ED Notes (Signed)
Pt c/o of hip pain medicated per orders will continue to monitor his pain

## 2017-05-15 NOTE — ED Notes (Signed)
meds given , pt lights dimmed

## 2017-05-15 NOTE — BHH Counselor (Signed)
Patient has been accepted at [facility] on 05/16/17 (Monday) after 0830.   Confirmed by Maye Hides, RN.  Accepting Provider, Idolina Primer, MD.   Nursing report number: [phone]  Attending Provider Rancour, MD notified at [place].   Leroy Sea, [place] Therapeutic Triage Specialist  [phone]

## 2017-05-15 NOTE — ED Notes (Signed)
Assisted with TV channel change- pacing in room and stating he has a cramp in his leg

## 2017-05-15 NOTE — ED Notes (Signed)
Out of bed to BR 

## 2017-05-15 NOTE — ED Notes (Signed)
Pt given crackers peanut butter and diet ginger ale  Encouraged to have restorative sleep,

## 2017-05-15 NOTE — ED Notes (Signed)
Pt reports anxiety- Dr Lenna Sciara in to assess and pt is offered [facility] which he initially refused but has decided to take

## 2017-05-16 DIAGNOSIS — I119 Hypertensive heart disease without heart failure: Secondary | ICD-10-CM | POA: Diagnosis present

## 2017-05-16 DIAGNOSIS — M719 Bursopathy, unspecified: Secondary | ICD-10-CM | POA: Diagnosis present

## 2017-05-16 DIAGNOSIS — Z7982 Long term (current) use of aspirin: Secondary | ICD-10-CM | POA: Diagnosis not present

## 2017-05-16 DIAGNOSIS — K469 Unspecified abdominal hernia without obstruction or gangrene: Secondary | ICD-10-CM | POA: Diagnosis present

## 2017-05-16 DIAGNOSIS — R45851 Suicidal ideations: Secondary | ICD-10-CM | POA: Diagnosis present

## 2017-05-16 DIAGNOSIS — K5909 Other constipation: Secondary | ICD-10-CM | POA: Diagnosis not present

## 2017-05-16 DIAGNOSIS — M1990 Unspecified osteoarthritis, unspecified site: Secondary | ICD-10-CM | POA: Diagnosis not present

## 2017-05-16 DIAGNOSIS — J449 Chronic obstructive pulmonary disease, unspecified: Secondary | ICD-10-CM | POA: Diagnosis not present

## 2017-05-16 DIAGNOSIS — M519 Unspecified thoracic, thoracolumbar and lumbosacral intervertebral disc disorder: Secondary | ICD-10-CM | POA: Diagnosis present

## 2017-05-16 DIAGNOSIS — F251 Schizoaffective disorder, depressive type: Secondary | ICD-10-CM | POA: Diagnosis present

## 2017-05-16 DIAGNOSIS — L299 Pruritus, unspecified: Secondary | ICD-10-CM | POA: Diagnosis not present

## 2017-05-16 DIAGNOSIS — M5136 Other intervertebral disc degeneration, lumbar region: Secondary | ICD-10-CM | POA: Diagnosis not present

## 2017-05-16 DIAGNOSIS — E669 Obesity, unspecified: Secondary | ICD-10-CM | POA: Diagnosis present

## 2017-05-16 DIAGNOSIS — F17210 Nicotine dependence, cigarettes, uncomplicated: Secondary | ICD-10-CM | POA: Diagnosis not present

## 2017-05-16 DIAGNOSIS — K219 Gastro-esophageal reflux disease without esophagitis: Secondary | ICD-10-CM | POA: Diagnosis not present

## 2017-05-16 DIAGNOSIS — E7800 Pure hypercholesterolemia, unspecified: Secondary | ICD-10-CM | POA: Diagnosis present

## 2017-05-16 DIAGNOSIS — F259 Schizoaffective disorder, unspecified: Secondary | ICD-10-CM | POA: Diagnosis not present

## 2017-05-16 DIAGNOSIS — G629 Polyneuropathy, unspecified: Secondary | ICD-10-CM | POA: Diagnosis not present

## 2017-05-16 DIAGNOSIS — Z915 Personal history of self-harm: Secondary | ICD-10-CM | POA: Diagnosis not present

## 2017-05-16 DIAGNOSIS — E119 Type 2 diabetes mellitus without complications: Secondary | ICD-10-CM | POA: Diagnosis not present

## 2017-05-16 DIAGNOSIS — E1140 Type 2 diabetes mellitus with diabetic neuropathy, unspecified: Secondary | ICD-10-CM | POA: Diagnosis present

## 2017-05-16 DIAGNOSIS — K5900 Constipation, unspecified: Secondary | ICD-10-CM | POA: Diagnosis present

## 2017-05-16 DIAGNOSIS — F329 Major depressive disorder, single episode, unspecified: Secondary | ICD-10-CM | POA: Diagnosis not present

## 2017-05-16 DIAGNOSIS — I10 Essential (primary) hypertension: Secondary | ICD-10-CM | POA: Diagnosis not present

## 2017-05-16 DIAGNOSIS — E784 Other hyperlipidemia: Secondary | ICD-10-CM | POA: Diagnosis not present

## 2017-05-16 LAB — CBG MONITORING, ED: GLUCOSE-CAPILLARY: 181 mg/dL — AB (ref 65–99)

## 2017-05-16 NOTE — ED Provider Notes (Signed)
  Physical Exam  BP 127/89 (BP Location: Right Arm)   Pulse 88   Temp 97.8 F (36.6 C) (Oral)   Resp 16   Ht 5\' 10"  (1.778 m)   Wt 97.5 kg (215 lb)   SpO2 99%   BMI 30.85 kg/m   Physical Exam  ED Course  Procedures  MDM Accepted at [facility] by Dr. Idolina Primer.       Davonna Belling, MD 05/16/17 [phone]

## 2017-05-16 NOTE — ED Notes (Addendum)
Pt resting, sitter at bedside,

## 2017-05-16 NOTE — ED Notes (Signed)
Pt resting will arouse when spoken to, sitter remains at bedside,

## 2017-05-16 NOTE — ED Notes (Signed)
Called [facility] for transport to [facility]. 

## 2017-05-16 NOTE — ED Notes (Signed)
Pt c/o back pain, prn medication given as ordered,

## 2017-05-16 NOTE — ED Notes (Signed)
Received report on pt, pt resting with eyes closed,supine on stretcher,  resp even and non labored, sitter remains at bedside,

## 2017-06-14 ENCOUNTER — Encounter (HOSPITAL_COMMUNITY): Payer: Self-pay | Admitting: Emergency Medicine

## 2017-06-14 ENCOUNTER — Other Ambulatory Visit: Payer: Self-pay

## 2017-06-14 ENCOUNTER — Emergency Department (HOSPITAL_COMMUNITY)
Admission: EM | Admit: 2017-06-14 | Discharge: 2017-06-15 | Disposition: A | Discharge status: Home / Self Care | Payer: Medicare Other | Attending: Emergency Medicine | Admitting: Emergency Medicine

## 2017-06-14 DIAGNOSIS — Y998 Other external cause status: Secondary | ICD-10-CM | POA: Diagnosis not present

## 2017-06-14 DIAGNOSIS — F341 Dysthymic disorder: Secondary | ICD-10-CM | POA: Diagnosis not present

## 2017-06-14 DIAGNOSIS — T1491XA Suicide attempt, initial encounter: Secondary | ICD-10-CM | POA: Insufficient documentation

## 2017-06-14 DIAGNOSIS — Z794 Long term (current) use of insulin: Secondary | ICD-10-CM | POA: Diagnosis not present

## 2017-06-14 DIAGNOSIS — Y9389 Activity, other specified: Secondary | ICD-10-CM | POA: Diagnosis not present

## 2017-06-14 DIAGNOSIS — Y929 Unspecified place or not applicable: Secondary | ICD-10-CM | POA: Diagnosis not present

## 2017-06-14 DIAGNOSIS — X838XXA Intentional self-harm by other specified means, initial encounter: Secondary | ICD-10-CM | POA: Diagnosis not present

## 2017-06-14 DIAGNOSIS — F251 Schizoaffective disorder, depressive type: Secondary | ICD-10-CM | POA: Diagnosis not present

## 2017-06-14 DIAGNOSIS — R45851 Suicidal ideations: Secondary | ICD-10-CM | POA: Diagnosis not present

## 2017-06-14 DIAGNOSIS — E871 Hypo-osmolality and hyponatremia: Secondary | ICD-10-CM | POA: Insufficient documentation

## 2017-06-14 DIAGNOSIS — I10 Essential (primary) hypertension: Secondary | ICD-10-CM | POA: Insufficient documentation

## 2017-06-14 DIAGNOSIS — F259 Schizoaffective disorder, unspecified: Secondary | ICD-10-CM | POA: Insufficient documentation

## 2017-06-14 DIAGNOSIS — E1165 Type 2 diabetes mellitus with hyperglycemia: Secondary | ICD-10-CM | POA: Insufficient documentation

## 2017-06-14 DIAGNOSIS — R9431 Abnormal electrocardiogram [ECG] [EKG]: Secondary | ICD-10-CM | POA: Diagnosis not present

## 2017-06-14 DIAGNOSIS — F1920 Other psychoactive substance dependence, uncomplicated: Secondary | ICD-10-CM | POA: Diagnosis not present

## 2017-06-14 DIAGNOSIS — F29 Unspecified psychosis not due to a substance or known physiological condition: Secondary | ICD-10-CM | POA: Diagnosis not present

## 2017-06-14 DIAGNOSIS — T383X2A Poisoning by insulin and oral hypoglycemic [antidiabetic] drugs, intentional self-harm, initial encounter: Secondary | ICD-10-CM | POA: Diagnosis not present

## 2017-06-14 DIAGNOSIS — Z7982 Long term (current) use of aspirin: Secondary | ICD-10-CM | POA: Diagnosis not present

## 2017-06-14 DIAGNOSIS — F17210 Nicotine dependence, cigarettes, uncomplicated: Secondary | ICD-10-CM | POA: Insufficient documentation

## 2017-06-14 DIAGNOSIS — R739 Hyperglycemia, unspecified: Secondary | ICD-10-CM

## 2017-06-14 LAB — COMPREHENSIVE METABOLIC PANEL
ALBUMIN: 4 g/dL (ref 3.5–5.0)
ALT: 17 U/L (ref 17–63)
AST: 14 U/L — AB (ref 15–41)
Alkaline Phosphatase: 72 U/L (ref 38–126)
Anion gap: 7 (ref 5–15)
BUN: 9 mg/dL (ref 6–20)
CHLORIDE: 90 mmol/L — AB (ref 101–111)
CO2: 28 mmol/L (ref 22–32)
Calcium: 8.7 mg/dL — ABNORMAL LOW (ref 8.9–10.3)
Creatinine, Ser: 1.33 mg/dL — ABNORMAL HIGH (ref 0.61–1.24)
GFR calc Af Amer: >60 mL/min (ref >60)
GFR calc non Af Amer: 58 mL/min — ABNORMAL LOW (ref >60)
GLUCOSE: 197 mg/dL — AB (ref 65–99)
POTASSIUM: 4.6 mmol/L (ref 3.5–5.1)
SODIUM: 125 mmol/L — AB (ref 135–145)
Total Bilirubin: 0.5 mg/dL (ref 0.3–1.2)
Total Protein: 6.7 g/dL (ref 6.5–8.1)

## 2017-06-14 LAB — CBC
HCT: 40.3 % (ref 39.0–52.0)
Hemoglobin: 14.9 g/dL (ref 13.0–17.0)
MCH: 32.5 pg (ref 26.0–34.0)
MCHC: 37 g/dL — ABNORMAL HIGH (ref 30.0–36.0)
MCV: 87.8 fL (ref 78.0–100.0)
PLATELETS: 157 10*3/uL (ref 150–400)
RBC: 4.59 MIL/uL (ref 4.22–5.81)
RDW: 12.7 % (ref 11.5–15.5)
WBC: 4.6 10*3/uL (ref 4.0–10.5)

## 2017-06-14 LAB — ACETAMINOPHEN LEVEL: Acetaminophen (Tylenol), Serum: <10 ug/mL — ABNORMAL LOW (ref 10–30)

## 2017-06-14 LAB — ETHANOL

## 2017-06-14 LAB — SALICYLATE LEVEL: Salicylate Lvl: <7 mg/dL (ref 2.8–30.0)

## 2017-06-14 LAB — CBG MONITORING, ED: Glucose-Capillary: 240 mg/dL — ABNORMAL HIGH (ref 65–99)

## 2017-06-14 MED ORDER — SODIUM CHLORIDE 0.9 % IV BOLUS (SEPSIS): 1000.0000 mL | Freq: Once | INTRAVENOUS | Status: DC

## 2017-06-14 MED ORDER — FAMOTIDINE 20 MG PO TABS: 20.0000 mg | ORAL_TABLET | Freq: Once | ORAL | Status: DC

## 2017-06-14 MED ORDER — SODIUM CHLORIDE 0.9 % IV BOLUS (SEPSIS): 500.0000 mL | Freq: Once | INTRAVENOUS | Status: DC

## 2017-06-14 NOTE — ED Notes (Signed)
Pt changed into purple scrubs. Belongings placed in secured locker. Pt wanded by security. Pt now in bed with sitter at bedside

## 2017-06-14 NOTE — ED Notes (Signed)
Pt informed of need for urine sample.

## 2017-06-14 NOTE — ED Provider Notes (Signed)
[facility] DEPT Provider Note   CSN: [id number] Arrival date & time: 06/14/17  2122     History   Chief Complaint Chief Complaint  Patient presents with  . V70.1    HPI Wynn L Windt is a 56 y.o. male with a history of schizoaffective disorder, MI, polysubstance abuse, DM, COPD and prior problems with suicidality and depression presenting with increased depression with suicide attempt 2 nights ago, stating he took 12,000 mg of metformin hoping he would "wake up in heaven".  He reports missing his mother and nephew, both died last year.  He most recently was evaluated for depression/suicidality in July and was treated as an inpatient at [facility].  He endorses being homeless since that admission although was recently staying with a friend.   The history is provided by the patient.    Past Medical History:  Diagnosis Date  . Alcohol dependence ([place]) 1979   stated abusing ETOH at age 23   . Back pain   . Benzodiazepine dependence ([place])   . Cardiac arrest ([place])   . Chronic back pain   . Cocaine abuse   . COPD (chronic obstructive pulmonary disease) ([place])   . Diabetes mellitus without complication ([place]) 1655  . Hypertension 2008  . MI (myocardial infarction) ([place])   . Non-compliance   . Schizoaffective disorder ([place]) 2006   . Substance abuse     Patient Active Problem List   Diagnosis Date Noted  . Diabetic neuropathy ([place]) 11/11/2016  . Anxiety and depression 11/11/2016  . Chronic insomnia 01/08/2016  . Alcohol use disorder ([place]) 12/10/2015  . Benzodiazepine abuse 12/10/2015  . Opioid abuse 12/10/2015  . Constipation 02/06/2015  . BPH (benign prostatic hyperplasia) 01/16/2015  . Vitamin D deficiency 01/16/2015  . Allergic rhinitis 11/19/2014  . Chronic low back pain 11/07/2014  . Onychomycosis of toenail 11/07/2014  . Tinea pedis 11/07/2014  . Psoriasis 10/14/2014  . HTN (hypertension) 10/14/2014  . Diabetes mellitus type 2, controlled ([place]) 10/14/2014  .  COPD (chronic obstructive pulmonary disease) ([place]) 10/14/2014  . Tobacco abuse 10/14/2014  . HLD (hyperlipidemia) 10/14/2014  . Schizoaffective disorder, depressive type ([place]) 10/14/2014    Past Surgical History:  Procedure Laterality Date  . CYST EXCISION         Home Medications    Prior to Admission medications   Medication Sig Start Date End Date Taking? Authorizing Provider  metFORMIN (GLUCOPHAGE-XR) 500 MG 24 hr tablet Take 500 mg by mouth 2 (two) times daily.   Yes [provider]  albuterol (PROVENTIL HFA;VENTOLIN HFA) 108 (90 Base) MCG/ACT inhaler Inhale 2 puffs into the lungs every 6 (six) hours as needed for wheezing or shortness of breath. Patient taking differently: Inhale 2 puffs into the lungs every 4 (four) hours as needed for wheezing or shortness of breath.  11/11/16   Dettinger, Fransisca Kaufmann, MD  aspirin EC 81 MG tablet Take 1 tablet (81 mg total) by mouth daily. Reported on 01/02/2016 02/16/16   Boykin Nearing, MD  atorvastatin (LIPITOR) 40 MG tablet TAKE 1 TABLET BY MOUTH EVERY DAY - EMERGENCY REFILL FAXED DR 12/17/16   Dettinger, Fransisca Kaufmann, MD  Blood Glucose Monitoring Suppl (BLOOD GLUCOSE MONITOR SYSTEM) w/Device KIT 1 each by Does not apply route daily. 11/11/16   Dettinger, Fransisca Kaufmann, MD  docusate sodium (COLACE) 100 MG capsule Take 100 mg by mouth 2 (two) times daily.    [provider]  fluticasone (FLONASE) 50 MCG/ACT nasal spray PLACE TWO  SPRAYS INTO BOTH NOSTRILS DAILY. 02/20/16   Funches, Adriana Mccallum, MD  Fluticasone-Salmeterol (ADVAIR) 100-50 MCG/DOSE AEPB Inhale 1 puff into the lungs 2 (two) times daily. Patient taking differently: Inhale 2 puffs into the lungs 2 (two) times daily.  11/11/16   Dettinger, Fransisca Kaufmann, MD  gabapentin (NEURONTIN) 800 MG tablet Take 1 tablet (800 mg total) by mouth 5 (five) times daily. 12/13/16   Dettinger, Fransisca Kaufmann, MD  Glucose Blood (BLOOD GLUCOSE TEST STRIPS) STRP 1 strip by In Vitro route 4 (four) times daily as needed.  11/11/16   Dettinger, Fransisca Kaufmann, MD  insulin aspart (NOVOLOG FLEXPEN) 100 UNIT/ML FlexPen Inject 14-20 Units into the skin 2 (two) times daily before a meal. Inject 20 units in the morning, and inject 14 units before dinner    [provider]  insulin NPH Human (HUMULIN N,NOVOLIN N) 100 UNIT/ML injection Inject 14-20 Units into the skin 2 (two) times daily before a meal. Inject 20 units in the morning, and inject 14 units 15 minutes before bedtime    [provider]  Insulin Syringe-Needle U-100 (B-D INS SYRINGE 0.5CC/31GX5/16) 31G X 5/16" 0.5 ML MISC 1 each by Does not apply route 3 (three) times daily. Patient taking differently: 1 each by Does not apply route 4 (four) times daily.  03/18/16   Funches, Adriana Mccallum, MD  isosorbide dinitrate (ISORDIL) 30 MG tablet Take 30 mg by mouth daily.    [provider]  lisinopril (PRINIVIL,ZESTRIL) 20 MG tablet Take 1 tablet (20 mg total) by mouth daily. 12/13/16   Dettinger, Fransisca Kaufmann, MD  omeprazole (PRILOSEC) 20 MG capsule Take 20 mg by mouth daily.    [provider]  ondansetron (ZOFRAN-ODT) 4 MG disintegrating tablet Take 4 mg by mouth every 6 (six) hours as needed for nausea or vomiting.    [provider]  paliperidone (INVEGA SUSTENNA) 234 MG/1.5ML SUSP injection Inject 234 mg into the muscle every 30 (thirty) days.    [provider]  tamsulosin (FLOMAX) 0.4 MG CAPS capsule Take 0.4 mg by mouth daily.    [provider]  traMADol-acetaminophen (ULTRACET) 37.5-325 MG tablet Take 1 tablet by mouth every 8 (eight) hours as needed.    [provider]  zolpidem (AMBIEN) 10 MG tablet Take 10 mg by mouth at bedtime as needed for sleep.    [provider]    Family History Family History  Problem Relation Age of Onset  . Heart disease Mother   . Heart disease Father   . Heart disease Maternal Grandmother   . Diabetes Maternal Grandmother     Social History Social History    Substance Use Topics  . Smoking status: Current Every Day Smoker    Packs/day: 1.50    Types: Cigarettes  . Smokeless tobacco: Never Used  . Alcohol use No     Comment: quit     Allergies   Quetiapine; Seroquel [quetiapine fumarate]; Trazodone; Trazodone and nefazodone; and Haldol [haloperidol lactate]   Review of Systems Review of Systems  Constitutional: Negative for fever.  HENT: Negative for congestion and sore throat.   Eyes: Negative.   Respiratory: Negative for chest tightness and shortness of breath.   Cardiovascular: Negative for chest pain.  Gastrointestinal: Negative for abdominal pain, nausea and vomiting.  Genitourinary: Negative.   Musculoskeletal: Positive for back pain. Negative for arthralgias, joint swelling and neck pain.  Skin: Negative.  Negative for rash and wound.  Neurological: Negative for dizziness, weakness, light-headedness, numbness and headaches.  Psychiatric/Behavioral: Positive for dysphoric mood and suicidal ideas.     Physical Exam Updated Vital Signs BP 121/69 (BP Location: Right Arm)   Pulse 97   Temp 98.9 F (37.2 C) (Oral)   Resp 19   Ht _0  (1.778 m)   Wt 97.5 kg (215 lb)   SpO2 99%   BMI 30.85 kg/m   Physical Exam  Constitutional: He appears well-developed and well-nourished.  HENT:  Head: Normocephalic and atraumatic.  Eyes: Conjunctivae are normal.  Neck: Normal range of motion.  Cardiovascular: Normal rate, regular rhythm, normal heart sounds and intact distal pulses.   Pulmonary/Chest: Effort normal and breath sounds normal. He has no wheezes.  Abdominal: Soft. Bowel sounds are normal. He exhibits distension. He exhibits no mass. There is no tenderness. There is no guarding.  Musculoskeletal: Normal range of motion.  Neurological: He is alert.  Skin: Skin is warm and dry.  Psychiatric: He has a normal mood and affect.  Nursing note and vitals reviewed.    ED Treatments / Results  Labs (all labs ordered are  listed, but only abnormal results are displayed) Labs Reviewed  COMPREHENSIVE METABOLIC PANEL - Abnormal; Notable for the following:       Result Value   Sodium 125 (*)    Chloride 90 (*)    Glucose, Bld 197 (*)    Creatinine, Ser 1.33 (*)    Calcium 8.7 (*)    AST 14 (*)    GFR calc non Af Amer 58 (*)    All other components within normal limits  ACETAMINOPHEN LEVEL - Abnormal; Notable for the following:    Acetaminophen (Tylenol), Serum <10 (*)    All other components within normal limits  CBC - Abnormal; Notable for the following:    MCHC 37.0 (*)    All other components within normal limits  CBG MONITORING, ED - Abnormal; Notable for the following:    Glucose-Capillary 240 (*)    All other components within normal limits  ETHANOL  SALICYLATE LEVEL  RAPID URINE DRUG SCREEN, HOSP PERFORMED  CBG MONITORING, ED    EKG  EKG Interpretation None       Radiology No results found.  Procedures Procedures (including critical care time)  Medications Ordered in ED Medications  famotidine (PEPCID) tablet 20 mg (not administered)  sodium chloride 0.9 % bolus 1,000 mL (not administered)     Initial Impression / Assessment and Plan / ED Course  I have reviewed the triage vital signs and the nursing notes.  Pertinent labs & imaging results that were available during my care of the patient were reviewed by me and considered in my medical decision making (see chart for details).     Pt with suicidal attempt with severe depression.  He is not yet medically cleared given hyponatremia.  IV fluids ordered.  Discussed with Dr Wyvonnia Dusky who will follow pt.   Final Clinical Impressions(s) / ED Diagnoses   Final diagnoses:  Suicidal behavior with attempted self-injury [facility])  Hyponatremia  Hyperglycemia    New Prescriptions New Prescriptions   No medications on file     Landis Martins 06/15/17 0002    Ezequiel Essex, MD 06/15/17 [phone]

## 2017-06-14 NOTE — ED Triage Notes (Signed)
Pt states he is homeless and Sunday night took a handful of metformin to kill himself. Pt states he misses his mom and nephew. Pt states he is out of all home meds and per ems his blood sugar was over 500.

## 2017-06-14 NOTE — ED Notes (Signed)
POC CBG 240

## 2017-06-14 NOTE — ED Notes (Signed)
Pt given water to drink. 

## 2017-06-15 DIAGNOSIS — F341 Dysthymic disorder: Secondary | ICD-10-CM | POA: Diagnosis not present

## 2017-06-15 LAB — BASIC METABOLIC PANEL
ANION GAP: 8 (ref 5–15)
BUN: 10 mg/dL (ref 6–20)
CALCIUM: 8.6 mg/dL — AB (ref 8.9–10.3)
CHLORIDE: 93 mmol/L — AB (ref 101–111)
CO2: 25 mmol/L (ref 22–32)
Creatinine, Ser: 1.14 mg/dL (ref 0.61–1.24)
GFR calc non Af Amer: >60 mL/min (ref >60)
GLUCOSE: 194 mg/dL — AB (ref 65–99)
POTASSIUM: 4.4 mmol/L (ref 3.5–5.1)
Sodium: 126 mmol/L — ABNORMAL LOW (ref 135–145)

## 2017-06-15 LAB — RAPID URINE DRUG SCREEN, HOSP PERFORMED
Amphetamines: NOT DETECTED
Barbiturates: NOT DETECTED
Benzodiazepines: POSITIVE — AB
Cocaine: NOT DETECTED
OPIATES: NOT DETECTED
TETRAHYDROCANNABINOL: NOT DETECTED

## 2017-06-15 LAB — CBG MONITORING, ED: Glucose-Capillary: 247 mg/dL — ABNORMAL HIGH (ref 65–99)

## 2017-06-15 MED ORDER — METFORMIN HCL ER 500 MG PO TB24: 500.0000 mg | ORAL_TABLET | Freq: Two times a day (BID) | ORAL | Status: DC

## 2017-06-15 MED ORDER — ALBUTEROL SULFATE HFA 108 (90 BASE) MCG/ACT IN AERS: 2.0000 | INHALATION_SPRAY | RESPIRATORY_TRACT | Status: DC | PRN

## 2017-06-15 MED ORDER — METFORMIN HCL ER 500 MG PO TB24
500.0000 mg | ORAL_TABLET | Freq: Two times a day (BID) | ORAL | Status: DC
  Administered 2017-06-15: 500 mg via ORAL
  Filled 2017-06-15 (×5): qty 1

## 2017-06-15 MED ORDER — GABAPENTIN 400 MG PO CAPS
800.0000 mg | ORAL_CAPSULE | Freq: Every day | ORAL | Status: DC
  Administered 2017-06-15 (×2): 800 mg via ORAL
  Filled 2017-06-15 (×12): qty 2

## 2017-06-15 MED ORDER — ISOSORBIDE DINITRATE 10 MG PO TABS
30.0000 mg | ORAL_TABLET | Freq: Every day | ORAL | Status: DC
  Administered 2017-06-15: 30 mg via ORAL
  Filled 2017-06-15 (×2): qty 3
  Filled 2017-06-15: qty 1

## 2017-06-15 MED ORDER — ONDANSETRON 4 MG PO TBDP: 4.0000 mg | ORAL_TABLET | Freq: Four times a day (QID) | ORAL | Status: DC | PRN

## 2017-06-15 MED ORDER — ATORVASTATIN CALCIUM 40 MG PO TABS
40.0000 mg | ORAL_TABLET | Freq: Every day | ORAL | Status: DC
  Filled 2017-06-15 (×2): qty 1

## 2017-06-15 MED ORDER — TAMSULOSIN HCL 0.4 MG PO CAPS
0.4000 mg | ORAL_CAPSULE | Freq: Every day | ORAL | Status: DC
  Administered 2017-06-15: 0.4 mg via ORAL
  Filled 2017-06-15: qty 1

## 2017-06-15 MED ORDER — ASPIRIN EC 81 MG PO TBEC
81.0000 mg | DELAYED_RELEASE_TABLET | Freq: Every day | ORAL | Status: DC
  Administered 2017-06-15: 81 mg via ORAL
  Filled 2017-06-15: qty 1

## 2017-06-15 MED ORDER — MOMETASONE FURO-FORMOTEROL FUM 100-5 MCG/ACT IN AERO
2.0000 | INHALATION_SPRAY | Freq: Two times a day (BID) | RESPIRATORY_TRACT | Status: DC
  Filled 2017-06-15: qty 8.8

## 2017-06-15 MED ORDER — LISINOPRIL 10 MG PO TABS
20.0000 mg | ORAL_TABLET | Freq: Every day | ORAL | Status: DC
  Administered 2017-06-15: 20 mg via ORAL
  Filled 2017-06-15: qty 2

## 2017-06-15 MED ORDER — NICOTINE 21 MG/24HR TD PT24
21.0000 mg | MEDICATED_PATCH | Freq: Once | TRANSDERMAL | Status: DC
  Administered 2017-06-15: 21 mg via TRANSDERMAL
  Filled 2017-06-15: qty 1

## 2017-06-15 MED ORDER — PANTOPRAZOLE SODIUM 40 MG PO TBEC
40.0000 mg | DELAYED_RELEASE_TABLET | Freq: Every day | ORAL | Status: DC
  Administered 2017-06-15: 40 mg via ORAL
  Filled 2017-06-15: qty 1

## 2017-06-15 MED ORDER — INSULIN ASPART 100 UNIT/ML ~~LOC~~ SOLN
0.0000 [IU] | Freq: Three times a day (TID) | SUBCUTANEOUS | Status: DC
  Administered 2017-06-15: 3 [IU] via SUBCUTANEOUS

## 2017-06-15 NOTE — ED Notes (Signed)
TTS in progress 

## 2017-06-15 NOTE — ED Notes (Signed)
Pt continually ventures out of room, pushes past sitter, and follows staff around demanding snacks and soft drinks. RN Lura Em notified. Pt informed that because his blood sugar was high he didn't need to be eating snacks and soft drinks that are high in sugar, and that it was 1:00 in the morning and snacks are not generally given at this time. Pt continued to push past sitter and walk around department making his requests known to staff. Security called and pt returned to his room.

## 2017-06-15 NOTE — ED Notes (Signed)
Spoke with poison control. Pt is medically cleared by them.

## 2017-06-15 NOTE — ED Notes (Addendum)
Pt requesting medication for anxiety multiple times. RN aware. Pt then became agitated and tried to push by myself to get to the nurses station. Pt standing in doorway yelling at staff. Attempted to redirect pt back into room then he started using foul language at myself. Security on standby. Pt then became apologetic claiming "I don't feel like myself. I'm off my medication and I'm homeless."

## 2017-06-15 NOTE — ED Notes (Signed)
Pt stable and ready for transport to [facility] in [place]. Report given to Janeece Agee, RN.

## 2017-06-15 NOTE — BH Assessment (Addendum)
Tele Assessment Note   Patient Name: [name] MRN: [id number] Referring Physician: DR Charlott Holler, PA Location of Patient: [facility] Location of Provider: [place] is an 56 y.o. male who came to the [facility] voluntarily c/o SI and stated he had attempted suicide on Sunday by taking an OD of his prescribed Metfomin. Per pt and pt record, pt has attempted suicide many times by various methods including trying to hang himself and drinking antifreeze. Pt sts that his mother who was his caretaker died in January 12, 2017 and he is grieving her loss "everyday." Pt sts he wants to go be with his mother.  Pt sts he is also discouraged over his declining health including Diabetes Type II and COPD. Pt sts he has chronic back pain and neuropathy. Pt sts he has services through [facility] and gets a monthly Invega injection and is compliant. Pt then sts he is out of his prescribed medications and that is why he came to the hospital. Pt sts she has been homeless for about 4 to 5 days. Pt sts he left [facility], resided at a [facility], stayed with "a woman" and finally became homeless. Pt sts he receives disability income and has been disabled for over 20 years. Pt has a hx of GAD, MDD, Schizoaffective D/O (Depressive type) and polysubstance abuse. Pt sts he has stopped or drastically cut down his drug and alcohol use. Pt sts he stopped cocaine "several years ago," gets "pain pills" off the street about once per month "when he needs it," and sts he drinks alcohol 1-2 times per week (sts today he consumed 1-12 oz beer- BAL was 0 in ED tonight.) Pt sts he smokes 1 1/2 to 2 packs of cigarettes per day. Pt sts he was last psychiatrically hospitalized in July at [facility] and last Jan 13, 2023 at [facility]. Pt was IP at [facility] in 01-13-23 and February 2018 per pt record. Pt denies HI, SHI and recent AVH. Pt denies hx of abuse. Pt denies any hx of violence or aggression. Pt's  symptoms of depression including sadness, fatigue, decreased self esteem, tearfulness / crying spells, self isolation, lack of motivation for activities and pleasure, irritability, negative outlook, difficulty thinking & concentrating, feeling helpless and hopeless, and sleep disturbances. Pt sts he feels anxious often and has racing thoughts.   Pt was dressed in scrubs and sitting on his hospital bed. Pt was alert, cooperative and polite. Pt kept fair eye contact, spoke in a slurred tone and at a normal pace. Pt moved in a normal manner when moving. Pt's thought process was coherent and relevant and judgement was impaired.  No indication of delusional thinking or response to internal stimuli. Pt's mood was stated as depressed and anxious and his blunted affect was congruent.  Pt was oriented x 4, to person, place, time and situation.   Diagnosis: SCHIZOAFFECTIVE D/O, DEPRESSIVE TYPE; HX OF POLYSUBSTANCE ABUSE; GAD BY HX  Past Medical History:  Past Medical History:  Diagnosis Date  . Alcohol dependence ([place]) 1979   stated abusing ETOH at age 41   . Back pain   . Benzodiazepine dependence ([place])   . Cardiac arrest ([place])   . Chronic back pain   . Cocaine abuse   . COPD (chronic obstructive pulmonary disease) ([place])   . Diabetes mellitus without complication ([place]) 7353  . Hypertension 2008  . MI (myocardial infarction) ([place])   . Non-compliance   .  Schizoaffective disorder ([place]) 2006   . Substance abuse     Past Surgical History:  Procedure Laterality Date  . CYST EXCISION      Family History:  Family History  Problem Relation Age of Onset  . Heart disease Mother   . Heart disease Father   . Heart disease Maternal Grandmother   . Diabetes Maternal Grandmother     Social History:  reports that he has been smoking Cigarettes.  He has been smoking about 1.50 packs per day. He has never used smokeless tobacco. He reports that he does not drink alcohol or use drugs.  Additional Social  History:  Alcohol / Drug Use Prescriptions: SEE MAR History of alcohol / drug use?: Yes Longest period of sobriety (when/how long): UNKNOWN Substance #1 Name of Substance 1: OPIOIDS (OFF THE STREET) 1 - Age of First Use: UNKNOWN 1 - Amount (size/oz): VARIES 1 - Frequency: 1 X MONTH ("WHEN I NEED IT") 1 - Duration: ONGOING 1 - Last Use / Amount: "FEW WEEKS AGO" Substance #2 Name of Substance 2: ALCOHOL 2 - Age of First Use: 17 2 - Amount (size/oz): 1 12 OZ BEER 2 - Frequency: 1-2 X WEEK 2 - Duration: ONGOING 2 - Last Use / Amount: 06/14/17 Substance #3 Name of Substance 3: COCAINE 3 - Age of First Use: UNKNOWN 3 - Amount (size/oz): VARIED 3 - Frequency: VARIED 3 - Duration: STS STOPPED 3 - Last Use / Amount: "SEVERAL YEARS AGO" Substance #4 Name of Substance 4: NICOTINE/CIGARETTES 4 - Age of First Use: TEENS 4 - Amount (size/oz): 1 1/2 TO 2 PKS 4 - Frequency: DAILY 4 - Duration: ONGOING 4 - Last Use / Amount: 06/14/17  CIWA: CIWA-Ar BP: 121/69 Pulse Rate: 97 COWS:    PATIENT STRENGTHS: (choose at least two) Average or above average intelligence Motivation for treatment/growth  Allergies:  Allergies  Allergen Reactions  . Quetiapine Anaphylaxis  . Seroquel [Quetiapine Fumarate] Anxiety and Other (See Comments)    Reaction:  Nightmares   . Trazodone Anaphylaxis  . Trazodone And Nefazodone Anxiety and Other (See Comments)    Other reaction(s): Unknown Nightmares Reaction:  Nightmares   . Haldol [Haloperidol Lactate] Anxiety and Other (See Comments)    Reaction:  Nightmares     Home Medications:  (Not in a hospital admission)  OB/GYN Status:  No LMP for male patient.  General Assessment Data Location of Assessment: [facility] TTS Assessment: In system Is this a Tele or Face-to-Face Assessment?: Tele Assessment Is this an Initial Assessment or a Re-assessment for this encounter?: Initial Assessment Marital status: Divorced Is patient pregnant?: No Living  Arrangements: Other (Comment) (STS HE IS NOW HOMELESS FOR LAST 4 TO 5 DAYS) Can pt return to current living arrangement?: Yes Admission Status: Voluntary Is patient capable of signing voluntary admission?: Yes Referral Source: Self/Family/Friend Insurance type:  (MEDICARE)     Crisis Care Plan Living Arrangements: Other (Comment) (STS HE IS NOW HOMELESS FOR LAST 4 TO 5 DAYS) Legal Guardian: Other: (SELF) Name of Psychiatrist:  (DR LARRY RAY AT [facility]) Name of Therapist:  ([facility] PER PT)  Education Status Is patient currently in school?: No Highest grade of school patient has completed:  (9TH)  Risk to self with the past 6 months Suicidal Ideation: Yes-Currently Present Has patient been a risk to self within the past 6 months prior to admission? : Yes Suicidal Intent: Yes-Currently Present Has patient had any suicidal intent within the past 6 months prior to admission? :  Yes Is patient at risk for suicide?: Yes Suicidal Plan?: Yes-Currently Present Has patient had any suicidal plan within the past 6 months prior to admission? : Yes Specify Current Suicidal Plan:  (STS HE TOOK "A HANDFUL OF METFORMIN" ON SUNDAY HOPING TO DIE) Access to Means: Yes Specify Access to Suicidal Means:  (RX MEDS) What has been your use of drugs/alcohol within the last 12 months?:  (ALCOHOL USE) Previous Attempts/Gestures: Yes How many times?:  (MULTIPLE) Other Self Harm Risks:  (NONE REPORTED) Triggers for Past Attempts: Unknown Intentional Self Injurious Behavior: None Family Suicide History: Unknown Recent stressful life event(s): Loss (Comment), Financial Problems, Recent negative physical changes (RECENT HOMELESSNESS; GRIEVING MOTHER'S DEATH IN Mar 09, 2023) Persecutory voices/beliefs?: Yes Depression: Yes Depression Symptoms: Despondent, Insomnia, Tearfulness, Isolating, Fatigue, Guilt, Loss of interest in usual pleasures, Feeling worthless/self pity Substance abuse history and/or treatment  for substance abuse?: Yes Suicide prevention information given to non-admitted patients: Not applicable  Risk to Others within the past 6 months Homicidal Ideation: No (DENIES) Does patient have any lifetime risk of violence toward others beyond the six months prior to admission? : No Thoughts of Harm to Others:  (DENIES) Current Homicidal Intent: No Current Homicidal Plan: No Access to Homicidal Means: No Identified Victim:  (NONE REPORTED) History of harm to others?: No Assessment of Violence: None Noted Violent Behavior Description:  (STS NO HX) Does patient have access to weapons?: No (DENIES) Criminal Charges Pending?: No Does patient have a court date: No Is patient on probation?: No  Psychosis Hallucinations: None noted (STS HAS NOT HEARD AH FOR ABOUT 1 MONTH; NO HX OF VH) Delusions: Persecutory (STS THINKS PEOPLE ARE AFTER HIM)  Mental Status Report Appearance/Hygiene: In scrubs Eye Contact: Good Motor Activity: Freedom of movement Speech: Logical/coherent Level of Consciousness: Alert Mood: Depressed, Anxious Affect: Anxious, Blunted, Depressed Anxiety Level: Moderate Thought Processes: Coherent, Relevant Judgement: Impaired Orientation: Person, Place, Time, Situation Obsessive Compulsive Thoughts/Behaviors: None  Cognitive Functioning Concentration: Fair Memory: Recent Intact, Remote Intact IQ: Average Insight: Poor Impulse Control: Poor Appetite: Good Weight Loss:  (0) Weight Gain:  (0) Sleep: No Change Total Hours of Sleep:  (4-6) Vegetative Symptoms: None  ADLScreening [facility]) Patient's cognitive ability adequate to safely complete daily activities?: Yes Patient able to express need for assistance with ADLs?: Yes Independently performs ADLs?: Yes (appropriate for developmental age) (NO BARRIERS REPORTED; CHRONIC BACK PAIN, COPD)  Prior Inpatient Therapy Prior Inpatient Therapy: Yes Prior Therapy Dates:  (JULY 2018; 12/24/16 MOST  RECENTLY) Prior Therapy Facilty/Provider(s):  ([facility]; [facility]) Reason for Treatment:  (SI, SCHIZOAFFECTIVE, DEPRESSIVE TYPE)  Prior Outpatient Therapy Prior Outpatient Therapy: Yes Prior Therapy Dates:  (CURRENT) Prior Therapy Facilty/Provider(s):  ([facility]) Reason for Treatment:  (SCHIZOAFFECTIVE) Does patient have an ACCT team?: Yes (STS HE DOES-STS RECEIVES MONTHLY IM) Does patient have Intensive In-House Services?  : No Does patient have [facility] services? : No Does patient have P4CC services?: No  ADL Screening (condition at time of admission) Patient's cognitive ability adequate to safely complete daily activities?: Yes Patient able to express need for assistance with ADLs?: Yes Independently performs ADLs?: Yes (appropriate for developmental age) (NO BARRIERS REPORTED; CHRONIC BACK PAIN, COPD)       Abuse/Neglect Assessment (Assessment to be complete while patient is alone) Physical Abuse: Denies Verbal Abuse: Denies Sexual Abuse: Denies Exploitation of patient/patient's resources: Denies Self-Neglect: Denies     Regulatory affairs officer (For Healthcare) Does Patient Have a Medical Advance Directive?: No Would patient  like information on creating a medical advance directive?: No - Patient declined    Additional Information 1:1 In Past 12 Months?: Yes CIRT Risk: No Elopement Risk: No Does patient have medical clearance?: Yes     Disposition:  Disposition Initial Assessment Completed for this Encounter: Yes Disposition of Patient: Other dispositions Other disposition(s): Other (Comment) (PENDING REVIEW W [facility] EXTENDER)  This service was provided via telemedicine using a 2-way, interactive audio and video technology.  Names of all persons participating in this telemedicine service and their role in this encounter. Name: Klint Rybarczyk Role: Patient  Name:  Role:   Name:  Role:   Name:  Role:    Recommend IP tx per Patriciaann Clan,  PA.  Per Inocencio Homes, [facility] no appropriate beds available at [facility] currently. Will seek outside placement.   Spoke with Dr. Wyvonnia Dusky and advised of recommendation. He stated he agreed.    Faylene Kurtz, MS, CRC, [place] Triage Specialist [facility] 06/15/2017 1:33 AM

## 2017-06-15 NOTE — ED Notes (Signed)
Pt refused IV  

## 2017-06-15 NOTE — ED Notes (Signed)
Pt resting comfortably at this time. Breakfast tray at bedside.

## 2017-06-15 NOTE — Progress Notes (Signed)
Patient meets criteria for inpatient treatment. CSW faxed referrals to the following inpatient facilities for review:  [facility], [facility], [facility], [facility]    TTS will continue to seek bed placement.   Radonna Ricker MSW, [place] Disposition [phone]

## 2017-06-15 NOTE — Progress Notes (Signed)
Per Roderic Palau in admissions at [facility], the patient has been accepted for inpatient psychiatric treatment.   Accepting/attending physician is Dr. Dareen Piano.   Number for report is [phone]. Patient will be transported to the [facility] building.   Rosealee Albee, RN notified.  Radonna Ricker MSW, [place] Disposition [phone]

## 2017-06-16 DIAGNOSIS — F250 Schizoaffective disorder, bipolar type: Secondary | ICD-10-CM | POA: Diagnosis not present

## 2017-06-17 DIAGNOSIS — F250 Schizoaffective disorder, bipolar type: Secondary | ICD-10-CM | POA: Diagnosis not present

## 2017-06-18 DIAGNOSIS — F250 Schizoaffective disorder, bipolar type: Secondary | ICD-10-CM | POA: Diagnosis not present

## 2017-06-19 DIAGNOSIS — F250 Schizoaffective disorder, bipolar type: Secondary | ICD-10-CM | POA: Diagnosis not present

## 2017-06-20 DIAGNOSIS — F250 Schizoaffective disorder, bipolar type: Secondary | ICD-10-CM | POA: Diagnosis not present

## 2017-06-21 DIAGNOSIS — F250 Schizoaffective disorder, bipolar type: Secondary | ICD-10-CM | POA: Diagnosis not present

## 2017-06-22 DIAGNOSIS — F250 Schizoaffective disorder, bipolar type: Secondary | ICD-10-CM | POA: Diagnosis not present

## 2017-07-01 ENCOUNTER — Emergency Department (HOSPITAL_COMMUNITY): Payer: Medicare Other

## 2017-07-01 ENCOUNTER — Encounter (HOSPITAL_COMMUNITY): Payer: Self-pay | Admitting: *Deleted

## 2017-07-01 ENCOUNTER — Emergency Department (HOSPITAL_COMMUNITY)
Admission: EM | Admit: 2017-07-01 | Discharge: 2017-07-01 | Discharge status: Against Medical Advice | Payer: Medicare Other | Attending: Emergency Medicine | Admitting: Emergency Medicine

## 2017-07-01 DIAGNOSIS — F17210 Nicotine dependence, cigarettes, uncomplicated: Secondary | ICD-10-CM | POA: Insufficient documentation

## 2017-07-01 DIAGNOSIS — Z7982 Long term (current) use of aspirin: Secondary | ICD-10-CM | POA: Diagnosis not present

## 2017-07-01 DIAGNOSIS — I10 Essential (primary) hypertension: Secondary | ICD-10-CM | POA: Insufficient documentation

## 2017-07-01 DIAGNOSIS — E1140 Type 2 diabetes mellitus with diabetic neuropathy, unspecified: Secondary | ICD-10-CM | POA: Insufficient documentation

## 2017-07-01 DIAGNOSIS — E119 Type 2 diabetes mellitus without complications: Secondary | ICD-10-CM | POA: Diagnosis not present

## 2017-07-01 DIAGNOSIS — R0789 Other chest pain: Secondary | ICD-10-CM | POA: Insufficient documentation

## 2017-07-01 DIAGNOSIS — J449 Chronic obstructive pulmonary disease, unspecified: Secondary | ICD-10-CM | POA: Insufficient documentation

## 2017-07-01 DIAGNOSIS — Z79899 Other long term (current) drug therapy: Secondary | ICD-10-CM | POA: Insufficient documentation

## 2017-07-01 DIAGNOSIS — Z5320 Procedure and treatment not carried out because of patient's decision for unspecified reasons: Secondary | ICD-10-CM | POA: Diagnosis not present

## 2017-07-01 DIAGNOSIS — Z794 Long term (current) use of insulin: Secondary | ICD-10-CM | POA: Insufficient documentation

## 2017-07-01 DIAGNOSIS — I252 Old myocardial infarction: Secondary | ICD-10-CM | POA: Diagnosis not present

## 2017-07-01 DIAGNOSIS — R0602 Shortness of breath: Secondary | ICD-10-CM | POA: Diagnosis not present

## 2017-07-01 DIAGNOSIS — R079 Chest pain, unspecified: Secondary | ICD-10-CM | POA: Diagnosis not present

## 2017-07-01 DIAGNOSIS — R069 Unspecified abnormalities of breathing: Secondary | ICD-10-CM | POA: Diagnosis not present

## 2017-07-01 DIAGNOSIS — F410 Panic disorder [episodic paroxysmal anxiety] without agoraphobia: Secondary | ICD-10-CM | POA: Diagnosis not present

## 2017-07-01 LAB — COMPREHENSIVE METABOLIC PANEL
ALT: 16 U/L — AB (ref 17–63)
AST: 15 U/L (ref 15–41)
Albumin: 3.9 g/dL (ref 3.5–5.0)
Alkaline Phosphatase: 68 U/L (ref 38–126)
Anion gap: 9 (ref 5–15)
BUN: 8 mg/dL (ref 6–20)
CO2: 21 mmol/L — AB (ref 22–32)
Calcium: 8.9 mg/dL (ref 8.9–10.3)
Chloride: 102 mmol/L (ref 101–111)
Creatinine, Ser: 0.99 mg/dL (ref 0.61–1.24)
GLUCOSE: 267 mg/dL — AB (ref 65–99)
POTASSIUM: 4.2 mmol/L (ref 3.5–5.1)
Sodium: 132 mmol/L — ABNORMAL LOW (ref 135–145)
Total Bilirubin: 0.6 mg/dL (ref 0.3–1.2)
Total Protein: 6.5 g/dL (ref 6.5–8.1)

## 2017-07-01 LAB — TROPONIN I: Troponin I: <0.03 ng/mL (ref <0.03)

## 2017-07-01 LAB — CBC WITH DIFFERENTIAL/PLATELET
BASOS ABS: 0 10*3/uL (ref 0.0–0.1)
Basophils Relative: 0 %
Eosinophils Absolute: 0.5 10*3/uL (ref 0.0–0.7)
Eosinophils Relative: 7 %
HCT: 41.2 % (ref 39.0–52.0)
Hemoglobin: 14.9 g/dL (ref 13.0–17.0)
LYMPHS PCT: 21 %
Lymphs Abs: 1.5 10*3/uL (ref 0.7–4.0)
MCH: 32.3 pg (ref 26.0–34.0)
MCHC: 36.2 g/dL — ABNORMAL HIGH (ref 30.0–36.0)
MCV: 89.2 fL (ref 78.0–100.0)
Monocytes Absolute: 0.6 10*3/uL (ref 0.1–1.0)
Monocytes Relative: 9 %
NEUTROS ABS: 4.4 10*3/uL (ref 1.7–7.7)
Neutrophils Relative %: 63 %
Platelets: 176 10*3/uL (ref 150–400)
RBC: 4.62 MIL/uL (ref 4.22–5.81)
RDW: 13.5 % (ref 11.5–15.5)
WBC: 7 10*3/uL (ref 4.0–10.5)

## 2017-07-01 NOTE — ED Notes (Signed)
ED Provider at bedside. 

## 2017-07-01 NOTE — ED Provider Notes (Signed)
[facility] DEPT Provider Note   CSN: [id number] Arrival date & time: 07/01/17  1039     History   Chief Complaint Chief Complaint  Patient presents with  . Shortness of Breath    HPI [name] is a 56 y.o. male.  HPI Patient states he's had episodic left-sided chest pain which became worse overnight. Patient states he was taking his isosorbide which improved the pain but that he ran out. He also complaining of left lateral chest pain which is worse with palpation and coughing. Denies any known trauma. No fever or chills. No new lower extremity swelling or pain. Patient states his chest pain has resolved. He has had some shortness of breath with wheezing. Past Medical History:  Diagnosis Date  . Alcohol dependence ([place]) 1979   stated abusing ETOH at age 56   . Back pain   . Benzodiazepine dependence ([place])   . Cardiac arrest ([place])   . Chronic back pain   . Cocaine abuse   . COPD (chronic obstructive pulmonary disease) ([place])   . Diabetes mellitus without complication ([place]) 6294  . Hypertension 2008  . MI (myocardial infarction) ([place])   . Non-compliance   . Schizoaffective disorder ([place]) 2006   . Substance abuse     Patient Active Problem List   Diagnosis Date Noted  . Diabetic neuropathy ([place]) 11/11/2016  . Anxiety and depression 11/11/2016  . Chronic insomnia 01/08/2016  . Alcohol use disorder ([place]) 12/10/2015  . Benzodiazepine abuse 12/10/2015  . Opioid abuse 12/10/2015  . Constipation 02/06/2015  . BPH (benign prostatic hyperplasia) 01/16/2015  . Vitamin D deficiency 01/16/2015  . Allergic rhinitis 11/19/2014  . Chronic low back pain 11/07/2014  . Onychomycosis of toenail 11/07/2014  . Tinea pedis 11/07/2014  . Psoriasis 10/14/2014  . HTN (hypertension) 10/14/2014  . Diabetes mellitus type 2, controlled ([place]) 10/14/2014  . COPD (chronic obstructive pulmonary disease) ([place]) 10/14/2014  . Tobacco abuse 10/14/2014  . HLD (hyperlipidemia) 10/14/2014  .  Schizoaffective disorder, depressive type ([place]) 10/14/2014    Past Surgical History:  Procedure Laterality Date  . CYST EXCISION         Home Medications    Prior to Admission medications   Medication Sig Start Date End Date Taking? Authorizing Provider  albuterol (PROVENTIL HFA;VENTOLIN HFA) 108 (90 Base) MCG/ACT inhaler Inhale 2 puffs into the lungs every 6 (six) hours as needed for wheezing or shortness of breath. Patient taking differently: Inhale 2 puffs into the lungs every 4 (four) hours as needed for wheezing or shortness of breath.  11/11/16  Yes Dettinger, Fransisca Kaufmann, MD  aspirin EC 81 MG tablet Take 1 tablet (81 mg total) by mouth daily. Reported on 01/02/2016 02/16/16  Yes Funches, Josalyn, MD  atorvastatin (LIPITOR) 40 MG tablet TAKE 1 TABLET BY MOUTH EVERY DAY - EMERGENCY REFILL FAXED DR 12/17/16  Yes Dettinger, Fransisca Kaufmann, MD  Blood Glucose Monitoring Suppl (BLOOD GLUCOSE MONITOR SYSTEM) w/Device KIT 1 each by Does not apply route daily. 11/11/16  Yes Dettinger, Fransisca Kaufmann, MD  docusate sodium (COLACE) 100 MG capsule Take 100 mg by mouth 2 (two) times daily.   Yes [provider]  fluticasone (FLONASE) 50 MCG/ACT nasal spray PLACE TWO   SPRAYS INTO BOTH NOSTRILS DAILY. 02/20/16  Yes Funches, Josalyn, MD  Fluticasone-Salmeterol (ADVAIR) 100-50 MCG/DOSE AEPB Inhale 1 puff into the lungs 2 (two) times daily. Patient taking differently: Inhale 2 puffs into the lungs 2 (two) times daily.  11/11/16  Yes Dettinger,  Fransisca Kaufmann, MD  gabapentin (NEURONTIN) 800 MG tablet Take 1 tablet (800 mg total) by mouth 5 (five) times daily. 12/13/16  Yes Dettinger, Fransisca Kaufmann, MD  Glucose Blood (BLOOD GLUCOSE TEST STRIPS) STRP 1 strip by In Vitro route 4 (four) times daily as needed. 11/11/16  Yes Dettinger, Fransisca Kaufmann, MD  insulin aspart (NOVOLOG FLEXPEN) 100 UNIT/ML FlexPen Inject 14-20 Units into the skin 2 (two) times daily before a meal. Inject 20 units in the morning, and inject 14 units before dinner    Yes [provider]  insulin NPH Human (HUMULIN N,NOVOLIN N) 100 UNIT/ML injection Inject 14-20 Units into the skin 2 (two) times daily before a meal. Inject 20 units in the morning, and inject 14 units 15 minutes before bedtime   Yes [provider]  Insulin Syringe-Needle U-100 (B-D INS SYRINGE 0.5CC/31GX5/16) 31G X 5/16" 0.5 ML MISC 1 each by Does not apply route 3 (three) times daily. Patient taking differently: 1 each by Does not apply route 4 (four) times daily.  03/18/16  Yes Funches, Josalyn, MD  isosorbide dinitrate (ISORDIL) 30 MG tablet Take 30 mg by mouth daily.   Yes [provider]  lisinopril (PRINIVIL,ZESTRIL) 20 MG tablet Take 1 tablet (20 mg total) by mouth daily. 12/13/16  Yes Dettinger, Fransisca Kaufmann, MD  metFORMIN (GLUCOPHAGE-XR) 500 MG 24 hr tablet Take 500 mg by mouth 2 (two) times daily.   Yes [provider]  omeprazole (PRILOSEC) 20 MG capsule Take 20 mg by mouth daily.   Yes [provider]  ondansetron (ZOFRAN-ODT) 4 MG disintegrating tablet Take 4 mg by mouth every 6 (six) hours as needed for nausea or vomiting.   Yes [provider]  paliperidone (INVEGA SUSTENNA) 234 MG/1.5ML SUSP injection Inject 234 mg into the muscle every 30 (thirty) days.   Yes [provider]  tamsulosin (FLOMAX) 0.4 MG CAPS capsule Take 0.4 mg by mouth daily.   Yes [provider]  traMADol-acetaminophen (ULTRACET) 37.5-325 MG tablet Take 1 tablet by mouth every 8 (eight) hours as needed.   Yes [provider]  zolpidem (AMBIEN) 10 MG tablet Take 10 mg by mouth at bedtime as needed for sleep.   Yes [provider]    Family History Family History  Problem Relation Age of Onset  . Heart disease Mother   . Heart disease Father   . Heart disease Maternal Grandmother   . Diabetes Maternal Grandmother     Social History Social History  Substance Use Topics  . Smoking status: Current Every Day Smoker     Packs/day: 1.50    Types: Cigarettes  . Smokeless tobacco: Never Used  . Alcohol use No     Comment: quit     Allergies   Quetiapine; Seroquel [quetiapine fumarate]; Trazodone; Trazodone and nefazodone; and Haldol [haloperidol lactate]   Review of Systems Review of Systems  Constitutional: Negative for chills, fatigue and fever.  Respiratory: Positive for cough, shortness of breath and wheezing.   Cardiovascular: Positive for chest pain. Negative for palpitations and leg swelling.  Gastrointestinal: Negative for abdominal pain, diarrhea, nausea and vomiting.  Musculoskeletal: Negative for back pain, myalgias, neck pain and neck stiffness.  Skin: Negative for rash and wound.  Neurological: Negative for dizziness, weakness, light-headedness, numbness and headaches.     Physical Exam Updated Vital Signs BP (!) 152/94   Pulse 71   Temp (!) 97.5 F (36.4 C) (Oral)   Resp (!) 22   Ht '5\' 11"'  (  1.803 m)   Wt 97.5 kg (215 lb)   SpO2 100%   BMI 29.99 kg/m   Physical Exam  Constitutional: He is oriented to person, place, and time. He appears well-developed and well-nourished. No distress.  HENT:  Head: Normocephalic and atraumatic.  Mouth/Throat: Oropharynx is clear and moist.  Eyes: Pupils are equal, round, and reactive to light. EOM are normal.  Neck: Normal range of motion. Neck supple.  Cardiovascular: Normal rate and regular rhythm.  Exam reveals no gallop and no friction rub.   No murmur heard. Pulmonary/Chest: Effort normal and breath sounds normal. No respiratory distress. He has no wheezes. He has no rales. He exhibits tenderness.  Patient with left lateral inferior rib tenderness to palpation. There is no crepitance or deformity.  Abdominal: Soft. Bowel sounds are normal. There is no tenderness. There is no rebound and no guarding.  Musculoskeletal: Normal range of motion. He exhibits no edema or tenderness.  No midline thoracic or lumbar tenderness. No CVA tenderness.  No lower extremity swelling, asymmetry or tenderness.  Neurological: He is alert and oriented to person, place, and time.  Skin: Skin is warm and dry. No rash noted. No erythema.  Psychiatric: He has a normal mood and affect. His behavior is normal.  Nursing note and vitals reviewed.    ED Treatments / Results  Labs (all labs ordered are listed, but only abnormal results are displayed) Labs Reviewed  CBC WITH DIFFERENTIAL/PLATELET - Abnormal; Notable for the following:       Result Value   MCHC 36.2 (*)    All other components within normal limits  COMPREHENSIVE METABOLIC PANEL - Abnormal; Notable for the following:    Sodium 132 (*)    CO2 21 (*)    Glucose, Bld 267 (*)    ALT 16 (*)    All other components within normal limits  TROPONIN I    EKG  EKG Interpretation None       Radiology Dg Chest Portable 1 View  Result Date: 07/01/2017 CLINICAL DATA:  Shortness of breath and chest pain since yesterday, worse today. History of COPD. EXAM: PORTABLE CHEST 1 VIEW COMPARISON:  02/05/2017 FINDINGS: The cardiac silhouette remains enlarged. The patient has taken a greater inspiration than on the prior study. There is mild chronic central airway thickening. No confluent airspace opacity, overt pulmonary edema, sizable pleural effusion, or pneumothorax is identified. No acute osseous abnormality is seen. IMPRESSION: Cardiomegaly without evidence of acute cardiopulmonary process. Electronically Signed   By: Logan Bores M.D.   On: 07/01/2017 11:14    Procedures Procedures (including critical care time)  Medications Ordered in ED Medications - No data to display   Initial Impression / Assessment and Plan / ED Course  I have reviewed the triage vital signs and the nursing notes.  Pertinent labs & imaging results that were available during my care of the patient were reviewed by me and considered in my medical decision making (see chart for details).    Patient left prior to  completion of workup. Chest pain appears to be mostly musculoskeletal in nature.   Final Clinical Impressions(s) / ED Diagnoses   Final diagnoses:  Left-sided chest wall pain    New Prescriptions Discharge Medication List as of 07/01/2017 12:50 PM       Julianne Rice, MD 07/01/17 1458

## 2017-07-01 NOTE — ED Notes (Signed)
Pt stating he wants to leave and that we aren't doing anything for him. Pt made aware that we have done blood work and a chest xray. Updated pt that we are waiting on the doctor to make an assessment of resulted values at this time. Pt requesting for IV to come out. Pt wants to leave AMA.

## 2017-07-01 NOTE — ED Notes (Signed)
Pt walked to the bathroom. 

## 2017-07-01 NOTE — ED Triage Notes (Signed)
Pt with sob and cp last night, hx of COPD.

## 2017-07-04 DIAGNOSIS — M25559 Pain in unspecified hip: Secondary | ICD-10-CM | POA: Diagnosis not present

## 2017-07-04 DIAGNOSIS — Z125 Encounter for screening for malignant neoplasm of prostate: Secondary | ICD-10-CM | POA: Diagnosis not present

## 2017-07-04 DIAGNOSIS — E119 Type 2 diabetes mellitus without complications: Secondary | ICD-10-CM | POA: Diagnosis not present

## 2017-07-04 DIAGNOSIS — F419 Anxiety disorder, unspecified: Secondary | ICD-10-CM | POA: Diagnosis not present

## 2017-07-04 DIAGNOSIS — Z0000 Encounter for general adult medical examination without abnormal findings: Secondary | ICD-10-CM | POA: Diagnosis not present

## 2017-07-04 DIAGNOSIS — Z6830 Body mass index (BMI) 30.0-30.9, adult: Secondary | ICD-10-CM | POA: Diagnosis not present

## 2017-07-04 DIAGNOSIS — I10 Essential (primary) hypertension: Secondary | ICD-10-CM | POA: Diagnosis not present

## 2017-07-10 DIAGNOSIS — M79604 Pain in right leg: Secondary | ICD-10-CM | POA: Diagnosis not present

## 2017-07-10 DIAGNOSIS — Z5321 Procedure and treatment not carried out due to patient leaving prior to being seen by health care provider: Secondary | ICD-10-CM | POA: Diagnosis not present

## 2017-07-10 DIAGNOSIS — M79605 Pain in left leg: Secondary | ICD-10-CM | POA: Diagnosis not present

## 2017-07-11 DIAGNOSIS — M79605 Pain in left leg: Secondary | ICD-10-CM | POA: Diagnosis not present

## 2017-07-11 DIAGNOSIS — M546 Pain in thoracic spine: Secondary | ICD-10-CM | POA: Diagnosis not present

## 2017-07-18 DIAGNOSIS — M25559 Pain in unspecified hip: Secondary | ICD-10-CM | POA: Diagnosis not present

## 2017-07-18 DIAGNOSIS — L409 Psoriasis, unspecified: Secondary | ICD-10-CM | POA: Diagnosis not present

## 2017-07-18 DIAGNOSIS — G8929 Other chronic pain: Secondary | ICD-10-CM | POA: Diagnosis not present

## 2017-07-18 DIAGNOSIS — E785 Hyperlipidemia, unspecified: Secondary | ICD-10-CM | POA: Diagnosis not present

## 2017-07-18 DIAGNOSIS — I10 Essential (primary) hypertension: Secondary | ICD-10-CM | POA: Diagnosis not present

## 2017-07-18 DIAGNOSIS — E1165 Type 2 diabetes mellitus with hyperglycemia: Secondary | ICD-10-CM | POA: Diagnosis not present

## 2017-07-18 DIAGNOSIS — F419 Anxiety disorder, unspecified: Secondary | ICD-10-CM | POA: Diagnosis not present

## 2017-07-25 DIAGNOSIS — M76892 Other specified enthesopathies of left lower limb, excluding foot: Secondary | ICD-10-CM | POA: Diagnosis not present

## 2017-07-25 DIAGNOSIS — M25559 Pain in unspecified hip: Secondary | ICD-10-CM | POA: Diagnosis not present

## 2017-07-25 DIAGNOSIS — E119 Type 2 diabetes mellitus without complications: Secondary | ICD-10-CM | POA: Diagnosis not present

## 2017-07-25 DIAGNOSIS — J449 Chronic obstructive pulmonary disease, unspecified: Secondary | ICD-10-CM | POA: Diagnosis not present

## 2017-07-25 DIAGNOSIS — F259 Schizoaffective disorder, unspecified: Secondary | ICD-10-CM | POA: Diagnosis not present

## 2017-07-25 DIAGNOSIS — Z79899 Other long term (current) drug therapy: Secondary | ICD-10-CM | POA: Diagnosis not present

## 2017-07-25 DIAGNOSIS — M7622 Iliac crest spur, left hip: Secondary | ICD-10-CM | POA: Diagnosis not present

## 2017-07-25 DIAGNOSIS — Z7982 Long term (current) use of aspirin: Secondary | ICD-10-CM | POA: Diagnosis not present

## 2017-07-25 DIAGNOSIS — E785 Hyperlipidemia, unspecified: Secondary | ICD-10-CM | POA: Diagnosis not present

## 2017-07-25 DIAGNOSIS — F17200 Nicotine dependence, unspecified, uncomplicated: Secondary | ICD-10-CM | POA: Diagnosis not present

## 2017-07-25 DIAGNOSIS — I10 Essential (primary) hypertension: Secondary | ICD-10-CM | POA: Diagnosis not present

## 2017-07-25 DIAGNOSIS — Z794 Long term (current) use of insulin: Secondary | ICD-10-CM | POA: Diagnosis not present

## 2017-07-25 DIAGNOSIS — R110 Nausea: Secondary | ICD-10-CM | POA: Diagnosis not present

## 2017-08-06 DIAGNOSIS — E7849 Other hyperlipidemia: Secondary | ICD-10-CM | POA: Diagnosis not present

## 2017-08-06 DIAGNOSIS — R0789 Other chest pain: Secondary | ICD-10-CM | POA: Diagnosis not present

## 2017-08-06 DIAGNOSIS — R079 Chest pain, unspecified: Secondary | ICD-10-CM | POA: Diagnosis not present

## 2017-08-06 DIAGNOSIS — Z79899 Other long term (current) drug therapy: Secondary | ICD-10-CM | POA: Diagnosis not present

## 2017-08-06 DIAGNOSIS — E119 Type 2 diabetes mellitus without complications: Secondary | ICD-10-CM | POA: Diagnosis not present

## 2017-08-06 DIAGNOSIS — Z794 Long term (current) use of insulin: Secondary | ICD-10-CM | POA: Diagnosis not present

## 2017-08-06 DIAGNOSIS — J441 Chronic obstructive pulmonary disease with (acute) exacerbation: Secondary | ICD-10-CM | POA: Diagnosis not present

## 2017-08-06 DIAGNOSIS — Z7982 Long term (current) use of aspirin: Secondary | ICD-10-CM | POA: Diagnosis not present

## 2017-08-06 DIAGNOSIS — Z7951 Long term (current) use of inhaled steroids: Secondary | ICD-10-CM | POA: Diagnosis not present

## 2017-08-06 DIAGNOSIS — I10 Essential (primary) hypertension: Secondary | ICD-10-CM | POA: Diagnosis not present

## 2017-08-06 DIAGNOSIS — R061 Stridor: Secondary | ICD-10-CM | POA: Diagnosis not present

## 2017-08-06 DIAGNOSIS — F17210 Nicotine dependence, cigarettes, uncomplicated: Secondary | ICD-10-CM | POA: Diagnosis not present

## 2017-08-06 DIAGNOSIS — F259 Schizoaffective disorder, unspecified: Secondary | ICD-10-CM | POA: Diagnosis not present

## 2017-08-09 DIAGNOSIS — R079 Chest pain, unspecified: Secondary | ICD-10-CM | POA: Diagnosis not present

## 2017-08-09 DIAGNOSIS — J449 Chronic obstructive pulmonary disease, unspecified: Secondary | ICD-10-CM | POA: Diagnosis not present

## 2017-08-09 DIAGNOSIS — E119 Type 2 diabetes mellitus without complications: Secondary | ICD-10-CM | POA: Diagnosis not present

## 2017-08-09 DIAGNOSIS — F259 Schizoaffective disorder, unspecified: Secondary | ICD-10-CM | POA: Diagnosis not present

## 2017-08-09 DIAGNOSIS — F17200 Nicotine dependence, unspecified, uncomplicated: Secondary | ICD-10-CM | POA: Diagnosis not present

## 2017-08-09 DIAGNOSIS — R0789 Other chest pain: Secondary | ICD-10-CM | POA: Diagnosis not present

## 2017-08-09 DIAGNOSIS — Z5321 Procedure and treatment not carried out due to patient leaving prior to being seen by health care provider: Secondary | ICD-10-CM | POA: Diagnosis not present

## 2017-08-12 DIAGNOSIS — R1032 Left lower quadrant pain: Secondary | ICD-10-CM | POA: Diagnosis not present

## 2017-08-12 DIAGNOSIS — R0789 Other chest pain: Secondary | ICD-10-CM | POA: Diagnosis not present

## 2017-08-12 DIAGNOSIS — J449 Chronic obstructive pulmonary disease, unspecified: Secondary | ICD-10-CM | POA: Diagnosis not present

## 2017-08-12 DIAGNOSIS — F259 Schizoaffective disorder, unspecified: Secondary | ICD-10-CM | POA: Diagnosis not present

## 2017-08-12 DIAGNOSIS — F17200 Nicotine dependence, unspecified, uncomplicated: Secondary | ICD-10-CM | POA: Diagnosis not present

## 2017-08-12 DIAGNOSIS — R52 Pain, unspecified: Secondary | ICD-10-CM | POA: Diagnosis not present

## 2017-08-12 DIAGNOSIS — R079 Chest pain, unspecified: Secondary | ICD-10-CM | POA: Diagnosis not present

## 2017-08-12 DIAGNOSIS — E119 Type 2 diabetes mellitus without complications: Secondary | ICD-10-CM | POA: Diagnosis not present

## 2017-08-15 DIAGNOSIS — Z7982 Long term (current) use of aspirin: Secondary | ICD-10-CM | POA: Diagnosis not present

## 2017-08-15 DIAGNOSIS — F17210 Nicotine dependence, cigarettes, uncomplicated: Secondary | ICD-10-CM | POA: Diagnosis not present

## 2017-08-15 DIAGNOSIS — F259 Schizoaffective disorder, unspecified: Secondary | ICD-10-CM | POA: Diagnosis not present

## 2017-08-15 DIAGNOSIS — I10 Essential (primary) hypertension: Secondary | ICD-10-CM | POA: Diagnosis not present

## 2017-08-15 DIAGNOSIS — J449 Chronic obstructive pulmonary disease, unspecified: Secondary | ICD-10-CM | POA: Diagnosis not present

## 2017-08-15 DIAGNOSIS — M546 Pain in thoracic spine: Secondary | ICD-10-CM | POA: Diagnosis not present

## 2017-08-15 DIAGNOSIS — B029 Zoster without complications: Secondary | ICD-10-CM | POA: Diagnosis not present

## 2017-08-15 DIAGNOSIS — E119 Type 2 diabetes mellitus without complications: Secondary | ICD-10-CM | POA: Diagnosis not present

## 2017-08-15 DIAGNOSIS — Z79899 Other long term (current) drug therapy: Secondary | ICD-10-CM | POA: Diagnosis not present

## 2017-08-15 DIAGNOSIS — G8929 Other chronic pain: Secondary | ICD-10-CM | POA: Diagnosis not present

## 2017-08-15 DIAGNOSIS — R52 Pain, unspecified: Secondary | ICD-10-CM | POA: Diagnosis not present

## 2017-08-15 DIAGNOSIS — E7849 Other hyperlipidemia: Secondary | ICD-10-CM | POA: Diagnosis not present

## 2017-08-15 DIAGNOSIS — Z794 Long term (current) use of insulin: Secondary | ICD-10-CM | POA: Diagnosis not present

## 2017-08-15 DIAGNOSIS — R079 Chest pain, unspecified: Secondary | ICD-10-CM | POA: Diagnosis not present

## 2017-08-15 DIAGNOSIS — Z7951 Long term (current) use of inhaled steroids: Secondary | ICD-10-CM | POA: Diagnosis not present

## 2017-08-16 DIAGNOSIS — T464X1A Poisoning by angiotensin-converting-enzyme inhibitors, accidental (unintentional), initial encounter: Secondary | ICD-10-CM | POA: Diagnosis not present

## 2017-08-16 DIAGNOSIS — J449 Chronic obstructive pulmonary disease, unspecified: Secondary | ICD-10-CM | POA: Diagnosis not present

## 2017-08-16 DIAGNOSIS — E785 Hyperlipidemia, unspecified: Secondary | ICD-10-CM | POA: Diagnosis not present

## 2017-08-16 DIAGNOSIS — Z5321 Procedure and treatment not carried out due to patient leaving prior to being seen by health care provider: Secondary | ICD-10-CM | POA: Diagnosis not present

## 2017-08-16 DIAGNOSIS — E119 Type 2 diabetes mellitus without complications: Secondary | ICD-10-CM | POA: Diagnosis not present

## 2017-08-16 DIAGNOSIS — Z794 Long term (current) use of insulin: Secondary | ICD-10-CM | POA: Diagnosis not present

## 2017-08-16 DIAGNOSIS — T1491XA Suicide attempt, initial encounter: Secondary | ICD-10-CM | POA: Diagnosis not present

## 2017-08-16 DIAGNOSIS — Z79899 Other long term (current) drug therapy: Secondary | ICD-10-CM | POA: Diagnosis not present

## 2017-08-16 DIAGNOSIS — T50901A Poisoning by unspecified drugs, medicaments and biological substances, accidental (unintentional), initial encounter: Secondary | ICD-10-CM | POA: Diagnosis not present

## 2017-08-16 DIAGNOSIS — Z7982 Long term (current) use of aspirin: Secondary | ICD-10-CM | POA: Diagnosis not present

## 2017-08-16 DIAGNOSIS — F17200 Nicotine dependence, unspecified, uncomplicated: Secondary | ICD-10-CM | POA: Diagnosis not present

## 2017-08-16 DIAGNOSIS — I10 Essential (primary) hypertension: Secondary | ICD-10-CM | POA: Diagnosis not present

## 2017-08-16 DIAGNOSIS — F29 Unspecified psychosis not due to a substance or known physiological condition: Secondary | ICD-10-CM | POA: Diagnosis not present

## 2017-08-17 DIAGNOSIS — R9431 Abnormal electrocardiogram [ECG] [EKG]: Secondary | ICD-10-CM | POA: Diagnosis not present

## 2017-08-17 DIAGNOSIS — R45851 Suicidal ideations: Secondary | ICD-10-CM | POA: Diagnosis not present

## 2017-08-17 DIAGNOSIS — F259 Schizoaffective disorder, unspecified: Secondary | ICD-10-CM | POA: Diagnosis not present

## 2017-08-17 DIAGNOSIS — E119 Type 2 diabetes mellitus without complications: Secondary | ICD-10-CM | POA: Diagnosis not present

## 2017-08-17 DIAGNOSIS — Z79899 Other long term (current) drug therapy: Secondary | ICD-10-CM | POA: Diagnosis not present

## 2017-08-17 DIAGNOSIS — Z7982 Long term (current) use of aspirin: Secondary | ICD-10-CM | POA: Diagnosis not present

## 2017-08-17 DIAGNOSIS — T50902A Poisoning by unspecified drugs, medicaments and biological substances, intentional self-harm, initial encounter: Secondary | ICD-10-CM | POA: Diagnosis not present

## 2017-08-17 DIAGNOSIS — F411 Generalized anxiety disorder: Secondary | ICD-10-CM | POA: Diagnosis not present

## 2017-08-17 DIAGNOSIS — I10 Essential (primary) hypertension: Secondary | ICD-10-CM | POA: Diagnosis not present

## 2017-08-17 DIAGNOSIS — Z7951 Long term (current) use of inhaled steroids: Secondary | ICD-10-CM | POA: Diagnosis not present

## 2017-08-17 DIAGNOSIS — J449 Chronic obstructive pulmonary disease, unspecified: Secondary | ICD-10-CM | POA: Diagnosis not present

## 2017-08-17 DIAGNOSIS — Z8249 Family history of ischemic heart disease and other diseases of the circulatory system: Secondary | ICD-10-CM | POA: Diagnosis not present

## 2017-08-17 DIAGNOSIS — Z590 Homelessness: Secondary | ICD-10-CM | POA: Diagnosis not present

## 2017-08-17 DIAGNOSIS — Z886 Allergy status to analgesic agent status: Secondary | ICD-10-CM | POA: Diagnosis not present

## 2017-08-17 DIAGNOSIS — Z915 Personal history of self-harm: Secondary | ICD-10-CM | POA: Diagnosis not present

## 2017-08-17 DIAGNOSIS — I952 Hypotension due to drugs: Secondary | ICD-10-CM | POA: Diagnosis not present

## 2017-08-17 DIAGNOSIS — T447X2A Poisoning by beta-adrenoreceptor antagonists, intentional self-harm, initial encounter: Secondary | ICD-10-CM | POA: Diagnosis not present

## 2017-08-17 DIAGNOSIS — B029 Zoster without complications: Secondary | ICD-10-CM | POA: Diagnosis not present

## 2017-08-17 DIAGNOSIS — F17200 Nicotine dependence, unspecified, uncomplicated: Secondary | ICD-10-CM | POA: Diagnosis not present

## 2017-08-17 DIAGNOSIS — Z888 Allergy status to other drugs, medicaments and biological substances status: Secondary | ICD-10-CM | POA: Diagnosis not present

## 2017-08-17 DIAGNOSIS — E7800 Pure hypercholesterolemia, unspecified: Secondary | ICD-10-CM | POA: Diagnosis not present

## 2017-08-17 DIAGNOSIS — Z794 Long term (current) use of insulin: Secondary | ICD-10-CM | POA: Diagnosis not present

## 2017-08-18 DIAGNOSIS — J449 Chronic obstructive pulmonary disease, unspecified: Secondary | ICD-10-CM | POA: Diagnosis not present

## 2017-08-18 DIAGNOSIS — F259 Schizoaffective disorder, unspecified: Secondary | ICD-10-CM | POA: Diagnosis not present

## 2017-08-18 DIAGNOSIS — T447X2A Poisoning by beta-adrenoreceptor antagonists, intentional self-harm, initial encounter: Secondary | ICD-10-CM | POA: Diagnosis not present

## 2017-08-18 DIAGNOSIS — F411 Generalized anxiety disorder: Secondary | ICD-10-CM | POA: Diagnosis not present

## 2017-08-18 DIAGNOSIS — F17200 Nicotine dependence, unspecified, uncomplicated: Secondary | ICD-10-CM | POA: Diagnosis not present

## 2017-08-18 DIAGNOSIS — E7800 Pure hypercholesterolemia, unspecified: Secondary | ICD-10-CM | POA: Diagnosis not present

## 2017-08-18 DIAGNOSIS — R45851 Suicidal ideations: Secondary | ICD-10-CM | POA: Diagnosis not present

## 2017-08-19 DIAGNOSIS — F332 Major depressive disorder, recurrent severe without psychotic features: Secondary | ICD-10-CM | POA: Diagnosis not present

## 2017-08-20 DIAGNOSIS — F332 Major depressive disorder, recurrent severe without psychotic features: Secondary | ICD-10-CM | POA: Diagnosis not present

## 2017-08-23 DIAGNOSIS — F332 Major depressive disorder, recurrent severe without psychotic features: Secondary | ICD-10-CM | POA: Diagnosis not present

## 2017-08-24 DIAGNOSIS — F332 Major depressive disorder, recurrent severe without psychotic features: Secondary | ICD-10-CM | POA: Diagnosis not present

## 2017-08-29 DIAGNOSIS — E785 Hyperlipidemia, unspecified: Secondary | ICD-10-CM | POA: Diagnosis not present

## 2017-08-29 DIAGNOSIS — Z79899 Other long term (current) drug therapy: Secondary | ICD-10-CM | POA: Diagnosis not present

## 2017-08-29 DIAGNOSIS — R0602 Shortness of breath: Secondary | ICD-10-CM | POA: Diagnosis not present

## 2017-08-29 DIAGNOSIS — J441 Chronic obstructive pulmonary disease with (acute) exacerbation: Secondary | ICD-10-CM | POA: Diagnosis not present

## 2017-08-29 DIAGNOSIS — Z7982 Long term (current) use of aspirin: Secondary | ICD-10-CM | POA: Diagnosis not present

## 2017-08-29 DIAGNOSIS — M79606 Pain in leg, unspecified: Secondary | ICD-10-CM | POA: Diagnosis not present

## 2017-08-29 DIAGNOSIS — E1165 Type 2 diabetes mellitus with hyperglycemia: Secondary | ICD-10-CM | POA: Diagnosis not present

## 2017-08-29 DIAGNOSIS — M79603 Pain in arm, unspecified: Secondary | ICD-10-CM | POA: Diagnosis not present

## 2017-08-29 DIAGNOSIS — Z720 Tobacco use: Secondary | ICD-10-CM | POA: Diagnosis not present

## 2017-08-29 DIAGNOSIS — I10 Essential (primary) hypertension: Secondary | ICD-10-CM | POA: Diagnosis not present

## 2017-08-29 DIAGNOSIS — Z794 Long term (current) use of insulin: Secondary | ICD-10-CM | POA: Diagnosis not present

## 2017-08-29 DIAGNOSIS — R05 Cough: Secondary | ICD-10-CM | POA: Diagnosis not present

## 2017-08-29 DIAGNOSIS — F17200 Nicotine dependence, unspecified, uncomplicated: Secondary | ICD-10-CM | POA: Diagnosis not present

## 2017-08-30 DIAGNOSIS — J441 Chronic obstructive pulmonary disease with (acute) exacerbation: Secondary | ICD-10-CM | POA: Diagnosis not present

## 2017-08-30 DIAGNOSIS — I10 Essential (primary) hypertension: Secondary | ICD-10-CM | POA: Diagnosis not present

## 2017-08-30 DIAGNOSIS — R7989 Other specified abnormal findings of blood chemistry: Secondary | ICD-10-CM | POA: Diagnosis not present

## 2017-08-30 DIAGNOSIS — F419 Anxiety disorder, unspecified: Secondary | ICD-10-CM | POA: Diagnosis not present

## 2017-08-30 DIAGNOSIS — R069 Unspecified abnormalities of breathing: Secondary | ICD-10-CM | POA: Diagnosis not present

## 2017-08-30 DIAGNOSIS — F17200 Nicotine dependence, unspecified, uncomplicated: Secondary | ICD-10-CM | POA: Diagnosis not present

## 2017-08-30 DIAGNOSIS — E119 Type 2 diabetes mellitus without complications: Secondary | ICD-10-CM | POA: Diagnosis not present

## 2017-08-30 DIAGNOSIS — J209 Acute bronchitis, unspecified: Secondary | ICD-10-CM | POA: Diagnosis not present

## 2017-08-30 DIAGNOSIS — J449 Chronic obstructive pulmonary disease, unspecified: Secondary | ICD-10-CM | POA: Diagnosis not present

## 2017-08-30 DIAGNOSIS — Z720 Tobacco use: Secondary | ICD-10-CM | POA: Diagnosis not present

## 2017-08-31 ENCOUNTER — Emergency Department (HOSPITAL_COMMUNITY)
Admission: EM | Admit: 2017-08-31 | Discharge: 2017-08-31 | Disposition: A | Discharge status: Home / Self Care | Payer: Medicare Other | Attending: Emergency Medicine | Admitting: Emergency Medicine

## 2017-08-31 ENCOUNTER — Other Ambulatory Visit: Payer: Self-pay

## 2017-08-31 ENCOUNTER — Encounter (HOSPITAL_COMMUNITY): Payer: Self-pay | Admitting: Emergency Medicine

## 2017-08-31 DIAGNOSIS — F17210 Nicotine dependence, cigarettes, uncomplicated: Secondary | ICD-10-CM | POA: Diagnosis not present

## 2017-08-31 DIAGNOSIS — F419 Anxiety disorder, unspecified: Secondary | ICD-10-CM | POA: Insufficient documentation

## 2017-08-31 DIAGNOSIS — Z794 Long term (current) use of insulin: Secondary | ICD-10-CM | POA: Diagnosis not present

## 2017-08-31 DIAGNOSIS — F329 Major depressive disorder, single episode, unspecified: Secondary | ICD-10-CM | POA: Insufficient documentation

## 2017-08-31 DIAGNOSIS — F1410 Cocaine abuse, uncomplicated: Secondary | ICD-10-CM | POA: Diagnosis not present

## 2017-08-31 DIAGNOSIS — I10 Essential (primary) hypertension: Secondary | ICD-10-CM | POA: Diagnosis not present

## 2017-08-31 DIAGNOSIS — I252 Old myocardial infarction: Secondary | ICD-10-CM | POA: Insufficient documentation

## 2017-08-31 DIAGNOSIS — F1110 Opioid abuse, uncomplicated: Secondary | ICD-10-CM | POA: Insufficient documentation

## 2017-08-31 DIAGNOSIS — Z79899 Other long term (current) drug therapy: Secondary | ICD-10-CM | POA: Insufficient documentation

## 2017-08-31 DIAGNOSIS — J449 Chronic obstructive pulmonary disease, unspecified: Secondary | ICD-10-CM | POA: Insufficient documentation

## 2017-08-31 DIAGNOSIS — E119 Type 2 diabetes mellitus without complications: Secondary | ICD-10-CM | POA: Insufficient documentation

## 2017-08-31 DIAGNOSIS — Z7982 Long term (current) use of aspirin: Secondary | ICD-10-CM | POA: Insufficient documentation

## 2017-08-31 DIAGNOSIS — R918 Other nonspecific abnormal finding of lung field: Secondary | ICD-10-CM | POA: Diagnosis not present

## 2017-08-31 DIAGNOSIS — F259 Schizoaffective disorder, unspecified: Secondary | ICD-10-CM | POA: Diagnosis not present

## 2017-08-31 MED ORDER — CLONAZEPAM 0.5 MG PO TABS
1.0000 mg | ORAL_TABLET | Freq: Once | ORAL | Status: AC
  Administered 2017-08-31: 1 mg via ORAL
  Filled 2017-08-31: qty 2

## 2017-08-31 MED ORDER — LEVALBUTEROL HCL 0.63 MG/3ML IN NEBU
0.6300 mg | INHALATION_SOLUTION | Freq: Once | RESPIRATORY_TRACT | Status: AC
  Administered 2017-08-31: 0.63 mg via RESPIRATORY_TRACT
  Filled 2017-08-31: qty 3

## 2017-08-31 NOTE — Discharge Instructions (Signed)
Get your prescriptions filled for your anxiety and use your inhaler for your shortness of breath
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
## 2017-09-02 DIAGNOSIS — F419 Anxiety disorder, unspecified: Secondary | ICD-10-CM | POA: Diagnosis not present

## 2017-09-02 DIAGNOSIS — Z7951 Long term (current) use of inhaled steroids: Secondary | ICD-10-CM | POA: Diagnosis not present

## 2017-09-02 DIAGNOSIS — R918 Other nonspecific abnormal finding of lung field: Secondary | ICD-10-CM | POA: Diagnosis not present

## 2017-09-02 DIAGNOSIS — F17200 Nicotine dependence, unspecified, uncomplicated: Secondary | ICD-10-CM | POA: Diagnosis not present

## 2017-09-02 DIAGNOSIS — F259 Schizoaffective disorder, unspecified: Secondary | ICD-10-CM | POA: Diagnosis not present

## 2017-09-02 DIAGNOSIS — R0789 Other chest pain: Secondary | ICD-10-CM | POA: Diagnosis not present

## 2017-09-02 DIAGNOSIS — J441 Chronic obstructive pulmonary disease with (acute) exacerbation: Secondary | ICD-10-CM | POA: Diagnosis not present

## 2017-09-02 DIAGNOSIS — Z7982 Long term (current) use of aspirin: Secondary | ICD-10-CM | POA: Diagnosis not present

## 2017-09-02 DIAGNOSIS — E119 Type 2 diabetes mellitus without complications: Secondary | ICD-10-CM | POA: Diagnosis not present

## 2017-09-02 DIAGNOSIS — I10 Essential (primary) hypertension: Secondary | ICD-10-CM | POA: Diagnosis not present

## 2017-09-02 DIAGNOSIS — Z794 Long term (current) use of insulin: Secondary | ICD-10-CM | POA: Diagnosis not present

## 2017-09-02 DIAGNOSIS — R069 Unspecified abnormalities of breathing: Secondary | ICD-10-CM | POA: Diagnosis not present

## 2017-09-02 DIAGNOSIS — E785 Hyperlipidemia, unspecified: Secondary | ICD-10-CM | POA: Diagnosis not present

## 2017-09-02 DIAGNOSIS — Z79899 Other long term (current) drug therapy: Secondary | ICD-10-CM | POA: Diagnosis not present

## 2017-09-02 DIAGNOSIS — Z720 Tobacco use: Secondary | ICD-10-CM | POA: Diagnosis not present

## 2017-09-06 DIAGNOSIS — E119 Type 2 diabetes mellitus without complications: Secondary | ICD-10-CM | POA: Diagnosis not present

## 2017-09-06 DIAGNOSIS — R0789 Other chest pain: Secondary | ICD-10-CM | POA: Diagnosis not present

## 2017-09-06 DIAGNOSIS — J449 Chronic obstructive pulmonary disease, unspecified: Secondary | ICD-10-CM | POA: Diagnosis not present

## 2017-09-06 DIAGNOSIS — F17210 Nicotine dependence, cigarettes, uncomplicated: Secondary | ICD-10-CM | POA: Diagnosis not present

## 2017-09-08 DIAGNOSIS — R109 Unspecified abdominal pain: Secondary | ICD-10-CM | POA: Diagnosis not present

## 2017-09-08 DIAGNOSIS — F17210 Nicotine dependence, cigarettes, uncomplicated: Secondary | ICD-10-CM | POA: Diagnosis not present

## 2017-09-08 DIAGNOSIS — R45851 Suicidal ideations: Secondary | ICD-10-CM | POA: Diagnosis not present

## 2017-09-08 DIAGNOSIS — R110 Nausea: Secondary | ICD-10-CM | POA: Diagnosis not present

## 2017-09-08 DIAGNOSIS — I10 Essential (primary) hypertension: Secondary | ICD-10-CM | POA: Diagnosis not present

## 2017-09-08 DIAGNOSIS — Z794 Long term (current) use of insulin: Secondary | ICD-10-CM | POA: Diagnosis not present

## 2017-09-08 DIAGNOSIS — Z7951 Long term (current) use of inhaled steroids: Secondary | ICD-10-CM | POA: Diagnosis not present

## 2017-09-08 DIAGNOSIS — Z7982 Long term (current) use of aspirin: Secondary | ICD-10-CM | POA: Diagnosis not present

## 2017-09-08 DIAGNOSIS — J449 Chronic obstructive pulmonary disease, unspecified: Secondary | ICD-10-CM | POA: Diagnosis not present

## 2017-09-08 DIAGNOSIS — F259 Schizoaffective disorder, unspecified: Secondary | ICD-10-CM | POA: Diagnosis not present

## 2017-09-08 DIAGNOSIS — E1165 Type 2 diabetes mellitus with hyperglycemia: Secondary | ICD-10-CM | POA: Diagnosis not present

## 2017-09-08 DIAGNOSIS — E785 Hyperlipidemia, unspecified: Secondary | ICD-10-CM | POA: Diagnosis not present

## 2017-09-08 DIAGNOSIS — Z79899 Other long term (current) drug therapy: Secondary | ICD-10-CM | POA: Diagnosis not present

## 2017-09-10 DIAGNOSIS — J449 Chronic obstructive pulmonary disease, unspecified: Secondary | ICD-10-CM | POA: Diagnosis not present

## 2017-09-10 DIAGNOSIS — R0902 Hypoxemia: Secondary | ICD-10-CM | POA: Diagnosis not present

## 2017-09-10 DIAGNOSIS — T426X2A Poisoning by other antiepileptic and sedative-hypnotic drugs, intentional self-harm, initial encounter: Secondary | ICD-10-CM | POA: Diagnosis not present

## 2017-09-10 DIAGNOSIS — Z720 Tobacco use: Secondary | ICD-10-CM | POA: Diagnosis not present

## 2017-09-10 DIAGNOSIS — T383X2A Poisoning by insulin and oral hypoglycemic [antidiabetic] drugs, intentional self-harm, initial encounter: Secondary | ICD-10-CM | POA: Diagnosis not present

## 2017-09-10 DIAGNOSIS — I10 Essential (primary) hypertension: Secondary | ICD-10-CM | POA: Diagnosis not present

## 2017-09-10 DIAGNOSIS — F17200 Nicotine dependence, unspecified, uncomplicated: Secondary | ICD-10-CM | POA: Diagnosis not present

## 2017-09-10 DIAGNOSIS — T1491XA Suicide attempt, initial encounter: Secondary | ICD-10-CM | POA: Diagnosis not present

## 2017-09-10 DIAGNOSIS — F29 Unspecified psychosis not due to a substance or known physiological condition: Secondary | ICD-10-CM | POA: Diagnosis not present

## 2017-09-10 DIAGNOSIS — E119 Type 2 diabetes mellitus without complications: Secondary | ICD-10-CM | POA: Diagnosis not present

## 2017-09-11 DIAGNOSIS — J449 Chronic obstructive pulmonary disease, unspecified: Secondary | ICD-10-CM | POA: Diagnosis not present

## 2017-09-11 DIAGNOSIS — E119 Type 2 diabetes mellitus without complications: Secondary | ICD-10-CM | POA: Diagnosis not present

## 2017-09-11 DIAGNOSIS — T426X2A Poisoning by other antiepileptic and sedative-hypnotic drugs, intentional self-harm, initial encounter: Secondary | ICD-10-CM | POA: Diagnosis not present

## 2017-09-11 DIAGNOSIS — I10 Essential (primary) hypertension: Secondary | ICD-10-CM | POA: Diagnosis not present

## 2017-09-11 DIAGNOSIS — F259 Schizoaffective disorder, unspecified: Secondary | ICD-10-CM | POA: Diagnosis not present

## 2017-09-12 DIAGNOSIS — T426X2A Poisoning by other antiepileptic and sedative-hypnotic drugs, intentional self-harm, initial encounter: Secondary | ICD-10-CM | POA: Diagnosis not present

## 2017-09-12 DIAGNOSIS — F411 Generalized anxiety disorder: Secondary | ICD-10-CM | POA: Diagnosis not present

## 2017-09-12 DIAGNOSIS — F259 Schizoaffective disorder, unspecified: Secondary | ICD-10-CM | POA: Diagnosis not present

## 2017-09-12 DIAGNOSIS — J449 Chronic obstructive pulmonary disease, unspecified: Secondary | ICD-10-CM | POA: Diagnosis not present

## 2017-09-12 DIAGNOSIS — I10 Essential (primary) hypertension: Secondary | ICD-10-CM | POA: Diagnosis not present

## 2017-09-12 DIAGNOSIS — E119 Type 2 diabetes mellitus without complications: Secondary | ICD-10-CM | POA: Diagnosis not present

## 2017-09-22 DIAGNOSIS — F419 Anxiety disorder, unspecified: Secondary | ICD-10-CM | POA: Diagnosis not present

## 2017-09-22 DIAGNOSIS — I10 Essential (primary) hypertension: Secondary | ICD-10-CM | POA: Diagnosis not present

## 2017-09-22 DIAGNOSIS — E119 Type 2 diabetes mellitus without complications: Secondary | ICD-10-CM | POA: Diagnosis not present

## 2017-09-23 DIAGNOSIS — F23 Brief psychotic disorder: Secondary | ICD-10-CM | POA: Diagnosis not present

## 2017-09-23 DIAGNOSIS — F419 Anxiety disorder, unspecified: Secondary | ICD-10-CM | POA: Diagnosis not present

## 2017-09-24 DIAGNOSIS — R9431 Abnormal electrocardiogram [ECG] [EKG]: Secondary | ICD-10-CM | POA: Diagnosis not present

## 2017-09-24 DIAGNOSIS — E785 Hyperlipidemia, unspecified: Secondary | ICD-10-CM | POA: Diagnosis not present

## 2017-09-24 DIAGNOSIS — F17200 Nicotine dependence, unspecified, uncomplicated: Secondary | ICD-10-CM | POA: Diagnosis not present

## 2017-09-24 DIAGNOSIS — Z7951 Long term (current) use of inhaled steroids: Secondary | ICD-10-CM | POA: Diagnosis not present

## 2017-09-24 DIAGNOSIS — Z79899 Other long term (current) drug therapy: Secondary | ICD-10-CM | POA: Diagnosis not present

## 2017-09-24 DIAGNOSIS — Z046 Encounter for general psychiatric examination, requested by authority: Secondary | ICD-10-CM | POA: Diagnosis not present

## 2017-09-24 DIAGNOSIS — I10 Essential (primary) hypertension: Secondary | ICD-10-CM | POA: Diagnosis not present

## 2017-09-24 DIAGNOSIS — Z7982 Long term (current) use of aspirin: Secondary | ICD-10-CM | POA: Diagnosis not present

## 2017-09-24 DIAGNOSIS — E119 Type 2 diabetes mellitus without complications: Secondary | ICD-10-CM | POA: Diagnosis not present

## 2017-09-24 DIAGNOSIS — T50904A Poisoning by unspecified drugs, medicaments and biological substances, undetermined, initial encounter: Secondary | ICD-10-CM | POA: Diagnosis not present

## 2017-09-24 DIAGNOSIS — Z794 Long term (current) use of insulin: Secondary | ICD-10-CM | POA: Diagnosis not present

## 2017-09-24 DIAGNOSIS — I517 Cardiomegaly: Secondary | ICD-10-CM | POA: Diagnosis not present

## 2017-09-24 DIAGNOSIS — J449 Chronic obstructive pulmonary disease, unspecified: Secondary | ICD-10-CM | POA: Diagnosis not present

## 2017-09-24 DIAGNOSIS — T1491XA Suicide attempt, initial encounter: Secondary | ICD-10-CM | POA: Diagnosis not present

## 2017-09-28 DIAGNOSIS — Z794 Long term (current) use of insulin: Secondary | ICD-10-CM | POA: Diagnosis not present

## 2017-09-28 DIAGNOSIS — Z820 Family history of epilepsy and other diseases of the nervous system: Secondary | ICD-10-CM | POA: Diagnosis not present

## 2017-09-28 DIAGNOSIS — T50904A Poisoning by unspecified drugs, medicaments and biological substances, undetermined, initial encounter: Secondary | ICD-10-CM | POA: Diagnosis not present

## 2017-09-28 DIAGNOSIS — Z886 Allergy status to analgesic agent status: Secondary | ICD-10-CM | POA: Diagnosis not present

## 2017-09-28 DIAGNOSIS — Z79899 Other long term (current) drug therapy: Secondary | ICD-10-CM | POA: Diagnosis not present

## 2017-09-28 DIAGNOSIS — Z888 Allergy status to other drugs, medicaments and biological substances status: Secondary | ICD-10-CM | POA: Diagnosis not present

## 2017-09-28 DIAGNOSIS — E119 Type 2 diabetes mellitus without complications: Secondary | ICD-10-CM | POA: Diagnosis not present

## 2017-09-28 DIAGNOSIS — Z5321 Procedure and treatment not carried out due to patient leaving prior to being seen by health care provider: Secondary | ICD-10-CM | POA: Diagnosis not present

## 2017-09-28 DIAGNOSIS — F411 Generalized anxiety disorder: Secondary | ICD-10-CM | POA: Diagnosis not present

## 2017-09-28 DIAGNOSIS — R9431 Abnormal electrocardiogram [ECG] [EKG]: Secondary | ICD-10-CM | POA: Diagnosis not present

## 2017-09-28 DIAGNOSIS — F259 Schizoaffective disorder, unspecified: Secondary | ICD-10-CM | POA: Diagnosis not present

## 2017-09-28 DIAGNOSIS — Z7951 Long term (current) use of inhaled steroids: Secondary | ICD-10-CM | POA: Diagnosis not present

## 2017-09-28 DIAGNOSIS — F17200 Nicotine dependence, unspecified, uncomplicated: Secondary | ICD-10-CM | POA: Diagnosis not present

## 2017-09-28 DIAGNOSIS — Z915 Personal history of self-harm: Secondary | ICD-10-CM | POA: Diagnosis not present

## 2017-09-28 DIAGNOSIS — Z833 Family history of diabetes mellitus: Secondary | ICD-10-CM | POA: Diagnosis not present

## 2017-09-28 DIAGNOSIS — Z8249 Family history of ischemic heart disease and other diseases of the circulatory system: Secondary | ICD-10-CM | POA: Diagnosis not present

## 2017-09-28 DIAGNOSIS — Z836 Family history of other diseases of the respiratory system: Secondary | ICD-10-CM | POA: Diagnosis not present

## 2017-09-28 DIAGNOSIS — E871 Hypo-osmolality and hyponatremia: Secondary | ICD-10-CM | POA: Diagnosis not present

## 2017-09-28 DIAGNOSIS — J449 Chronic obstructive pulmonary disease, unspecified: Secondary | ICD-10-CM | POA: Diagnosis not present

## 2017-09-28 DIAGNOSIS — T447X2A Poisoning by beta-adrenoreceptor antagonists, intentional self-harm, initial encounter: Secondary | ICD-10-CM | POA: Diagnosis not present

## 2017-09-28 DIAGNOSIS — Z7982 Long term (current) use of aspirin: Secondary | ICD-10-CM | POA: Diagnosis not present

## 2017-09-29 DIAGNOSIS — J449 Chronic obstructive pulmonary disease, unspecified: Secondary | ICD-10-CM | POA: Diagnosis not present

## 2017-09-29 DIAGNOSIS — R0902 Hypoxemia: Secondary | ICD-10-CM | POA: Diagnosis not present

## 2017-09-29 DIAGNOSIS — E871 Hypo-osmolality and hyponatremia: Secondary | ICD-10-CM | POA: Diagnosis not present

## 2017-09-29 DIAGNOSIS — T447X2A Poisoning by beta-adrenoreceptor antagonists, intentional self-harm, initial encounter: Secondary | ICD-10-CM | POA: Diagnosis not present

## 2017-09-29 DIAGNOSIS — E119 Type 2 diabetes mellitus without complications: Secondary | ICD-10-CM | POA: Diagnosis not present

## 2017-10-02 DIAGNOSIS — Z7951 Long term (current) use of inhaled steroids: Secondary | ICD-10-CM | POA: Diagnosis not present

## 2017-10-02 DIAGNOSIS — F419 Anxiety disorder, unspecified: Secondary | ICD-10-CM | POA: Diagnosis not present

## 2017-10-02 DIAGNOSIS — I10 Essential (primary) hypertension: Secondary | ICD-10-CM | POA: Diagnosis not present

## 2017-10-02 DIAGNOSIS — B0229 Other postherpetic nervous system involvement: Secondary | ICD-10-CM | POA: Diagnosis not present

## 2017-10-02 DIAGNOSIS — R21 Rash and other nonspecific skin eruption: Secondary | ICD-10-CM | POA: Diagnosis not present

## 2017-10-02 DIAGNOSIS — Z79899 Other long term (current) drug therapy: Secondary | ICD-10-CM | POA: Diagnosis not present

## 2017-10-02 DIAGNOSIS — E119 Type 2 diabetes mellitus without complications: Secondary | ICD-10-CM | POA: Diagnosis not present

## 2017-10-02 DIAGNOSIS — Z794 Long term (current) use of insulin: Secondary | ICD-10-CM | POA: Diagnosis not present

## 2017-10-02 DIAGNOSIS — B029 Zoster without complications: Secondary | ICD-10-CM | POA: Diagnosis not present

## 2017-10-02 DIAGNOSIS — J449 Chronic obstructive pulmonary disease, unspecified: Secondary | ICD-10-CM | POA: Diagnosis not present

## 2017-10-02 DIAGNOSIS — R52 Pain, unspecified: Secondary | ICD-10-CM | POA: Diagnosis not present

## 2017-10-02 DIAGNOSIS — F17200 Nicotine dependence, unspecified, uncomplicated: Secondary | ICD-10-CM | POA: Diagnosis not present

## 2017-10-02 DIAGNOSIS — T7840XA Allergy, unspecified, initial encounter: Secondary | ICD-10-CM | POA: Diagnosis not present

## 2017-10-02 DIAGNOSIS — F329 Major depressive disorder, single episode, unspecified: Secondary | ICD-10-CM | POA: Diagnosis not present

## 2017-10-07 DIAGNOSIS — F418 Other specified anxiety disorders: Secondary | ICD-10-CM | POA: Diagnosis not present

## 2017-10-07 DIAGNOSIS — Z79899 Other long term (current) drug therapy: Secondary | ICD-10-CM | POA: Diagnosis not present

## 2017-10-07 DIAGNOSIS — Z794 Long term (current) use of insulin: Secondary | ICD-10-CM | POA: Diagnosis not present

## 2017-10-07 DIAGNOSIS — J449 Chronic obstructive pulmonary disease, unspecified: Secondary | ICD-10-CM | POA: Diagnosis not present

## 2017-10-07 DIAGNOSIS — E119 Type 2 diabetes mellitus without complications: Secondary | ICD-10-CM | POA: Diagnosis not present

## 2017-10-07 DIAGNOSIS — R1084 Generalized abdominal pain: Secondary | ICD-10-CM | POA: Diagnosis not present

## 2017-10-07 DIAGNOSIS — R21 Rash and other nonspecific skin eruption: Secondary | ICD-10-CM | POA: Diagnosis not present

## 2017-10-07 DIAGNOSIS — R069 Unspecified abnormalities of breathing: Secondary | ICD-10-CM | POA: Diagnosis not present

## 2017-10-07 DIAGNOSIS — I10 Essential (primary) hypertension: Secondary | ICD-10-CM | POA: Diagnosis not present

## 2017-10-07 DIAGNOSIS — Z8619 Personal history of other infectious and parasitic diseases: Secondary | ICD-10-CM | POA: Diagnosis not present

## 2017-10-07 DIAGNOSIS — Z7982 Long term (current) use of aspirin: Secondary | ICD-10-CM | POA: Diagnosis not present

## 2017-10-07 DIAGNOSIS — F17200 Nicotine dependence, unspecified, uncomplicated: Secondary | ICD-10-CM | POA: Diagnosis not present

## 2017-10-11 ENCOUNTER — Encounter (HOSPITAL_COMMUNITY): Payer: Self-pay | Admitting: Emergency Medicine

## 2017-10-11 ENCOUNTER — Emergency Department (HOSPITAL_COMMUNITY)
Admission: EM | Admit: 2017-10-11 | Discharge: 2017-10-12 | Disposition: A | Discharge status: Other Acute Inpatient Hospital | Payer: Medicare Other | Attending: Emergency Medicine | Admitting: Emergency Medicine

## 2017-10-11 ENCOUNTER — Other Ambulatory Visit: Payer: Self-pay

## 2017-10-11 DIAGNOSIS — F17210 Nicotine dependence, cigarettes, uncomplicated: Secondary | ICD-10-CM | POA: Insufficient documentation

## 2017-10-11 DIAGNOSIS — F29 Unspecified psychosis not due to a substance or known physiological condition: Secondary | ICD-10-CM | POA: Diagnosis not present

## 2017-10-11 DIAGNOSIS — R45851 Suicidal ideations: Secondary | ICD-10-CM | POA: Diagnosis not present

## 2017-10-11 DIAGNOSIS — E119 Type 2 diabetes mellitus without complications: Secondary | ICD-10-CM | POA: Diagnosis not present

## 2017-10-11 DIAGNOSIS — F329 Major depressive disorder, single episode, unspecified: Secondary | ICD-10-CM | POA: Diagnosis not present

## 2017-10-11 DIAGNOSIS — I10 Essential (primary) hypertension: Secondary | ICD-10-CM | POA: Diagnosis not present

## 2017-10-11 DIAGNOSIS — F419 Anxiety disorder, unspecified: Secondary | ICD-10-CM | POA: Diagnosis not present

## 2017-10-11 DIAGNOSIS — Z794 Long term (current) use of insulin: Secondary | ICD-10-CM | POA: Diagnosis not present

## 2017-10-11 DIAGNOSIS — Z79899 Other long term (current) drug therapy: Secondary | ICD-10-CM | POA: Diagnosis not present

## 2017-10-11 DIAGNOSIS — Z7982 Long term (current) use of aspirin: Secondary | ICD-10-CM | POA: Insufficient documentation

## 2017-10-11 DIAGNOSIS — J449 Chronic obstructive pulmonary disease, unspecified: Secondary | ICD-10-CM | POA: Insufficient documentation

## 2017-10-11 DIAGNOSIS — F99 Mental disorder, not otherwise specified: Secondary | ICD-10-CM | POA: Insufficient documentation

## 2017-10-11 LAB — COMPREHENSIVE METABOLIC PANEL
ALT: 15 U/L — AB (ref 17–63)
AST: 14 U/L — AB (ref 15–41)
Albumin: 4.1 g/dL (ref 3.5–5.0)
Alkaline Phosphatase: 81 U/L (ref 38–126)
Anion gap: 10 (ref 5–15)
BUN: 15 mg/dL (ref 6–20)
CHLORIDE: 98 mmol/L — AB (ref 101–111)
CO2: 24 mmol/L (ref 22–32)
CREATININE: 1.2 mg/dL (ref 0.61–1.24)
Calcium: 9.6 mg/dL (ref 8.9–10.3)
GFR calc Af Amer: >60 mL/min (ref >60)
Glucose, Bld: 197 mg/dL — ABNORMAL HIGH (ref 65–99)
Potassium: 3.5 mmol/L (ref 3.5–5.1)
Sodium: 132 mmol/L — ABNORMAL LOW (ref 135–145)
Total Bilirubin: 1 mg/dL (ref 0.3–1.2)
Total Protein: 7.2 g/dL (ref 6.5–8.1)

## 2017-10-11 LAB — CBC
HCT: 47.2 % (ref 39.0–52.0)
HEMOGLOBIN: 16.8 g/dL (ref 13.0–17.0)
MCH: 33.5 pg (ref 26.0–34.0)
MCHC: 35.6 g/dL (ref 30.0–36.0)
MCV: 94 fL (ref 78.0–100.0)
Platelets: 139 10*3/uL — ABNORMAL LOW (ref 150–400)
RBC: 5.02 MIL/uL (ref 4.22–5.81)
RDW: 14.3 % (ref 11.5–15.5)
WBC: 6.4 10*3/uL (ref 4.0–10.5)

## 2017-10-11 LAB — ACETAMINOPHEN LEVEL: Acetaminophen (Tylenol), Serum: <10 ug/mL — ABNORMAL LOW (ref 10–30)

## 2017-10-11 LAB — SALICYLATE LEVEL: Salicylate Lvl: <7 mg/dL (ref 2.8–30.0)

## 2017-10-11 LAB — CBG MONITORING, ED: GLUCOSE-CAPILLARY: 258 mg/dL — AB (ref 65–99)

## 2017-10-11 LAB — ETHANOL

## 2017-10-11 MED ORDER — PALIPERIDONE PALMITATE 234 MG/1.5ML IM SUSP: 234.0000 mg | INTRAMUSCULAR | Status: DC

## 2017-10-11 MED ORDER — ALBUTEROL SULFATE HFA 108 (90 BASE) MCG/ACT IN AERS: 2.0000 | INHALATION_SPRAY | RESPIRATORY_TRACT | Status: DC | PRN

## 2017-10-11 MED ORDER — ASPIRIN EC 81 MG PO TBEC
81.0000 mg | DELAYED_RELEASE_TABLET | Freq: Every day | ORAL | Status: DC
  Administered 2017-10-11 – 2017-10-12 (×2): 81 mg via ORAL
  Filled 2017-10-11 (×2): qty 1

## 2017-10-11 MED ORDER — ONDANSETRON 4 MG PO TBDP: 4.0000 mg | ORAL_TABLET | Freq: Four times a day (QID) | ORAL | Status: DC | PRN

## 2017-10-11 MED ORDER — TAMSULOSIN HCL 0.4 MG PO CAPS
0.4000 mg | ORAL_CAPSULE | Freq: Every day | ORAL | Status: DC
  Administered 2017-10-12: 0.4 mg via ORAL
  Filled 2017-10-11: qty 1

## 2017-10-11 MED ORDER — INSULIN ASPART 100 UNIT/ML FLEXPEN
14.0000 [IU] | PEN_INJECTOR | Freq: Two times a day (BID) | SUBCUTANEOUS | Status: DC
  Filled 2017-10-11: qty 3

## 2017-10-11 MED ORDER — LISINOPRIL 10 MG PO TABS
20.0000 mg | ORAL_TABLET | Freq: Every day | ORAL | Status: DC
  Administered 2017-10-12: 20 mg via ORAL
  Filled 2017-10-11: qty 2

## 2017-10-11 MED ORDER — ATORVASTATIN CALCIUM 40 MG PO TABS
40.0000 mg | ORAL_TABLET | Freq: Every day | ORAL | Status: DC
  Filled 2017-10-11 (×3): qty 1

## 2017-10-11 MED ORDER — INSULIN ASPART 100 UNIT/ML ~~LOC~~ SOLN
SUBCUTANEOUS | Status: AC
  Administered 2017-10-11: 15 [IU]
  Filled 2017-10-11: qty 1

## 2017-10-11 MED ORDER — PANTOPRAZOLE SODIUM 40 MG PO TBEC
40.0000 mg | DELAYED_RELEASE_TABLET | Freq: Every day | ORAL | Status: DC
  Administered 2017-10-12: 40 mg via ORAL
  Filled 2017-10-11: qty 1

## 2017-10-11 MED ORDER — ZOLPIDEM TARTRATE 5 MG PO TABS
10.0000 mg | ORAL_TABLET | Freq: Every evening | ORAL | Status: DC | PRN
  Administered 2017-10-11: 10 mg via ORAL
  Filled 2017-10-11: qty 2

## 2017-10-11 MED ORDER — ACETAMINOPHEN 325 MG PO TABS: 650.0000 mg | ORAL_TABLET | Freq: Four times a day (QID) | ORAL | Status: DC | PRN

## 2017-10-11 MED ORDER — CLONAZEPAM 0.5 MG PO TABS
0.5000 mg | ORAL_TABLET | Freq: Three times a day (TID) | ORAL | Status: DC | PRN
  Administered 2017-10-11 – 2017-10-12 (×2): 0.5 mg via ORAL
  Filled 2017-10-11 (×2): qty 1

## 2017-10-11 MED ORDER — GABAPENTIN 100 MG PO CAPS
ORAL_CAPSULE | ORAL | Status: AC
  Filled 2017-10-11: qty 2

## 2017-10-11 MED ORDER — ISOSORBIDE MONONITRATE 15 MG HALF TABLET
30.0000 mg | ORAL_TABLET | Freq: Every day | ORAL | Status: DC
  Administered 2017-10-12: 30 mg via ORAL
  Filled 2017-10-11 (×3): qty 2

## 2017-10-11 MED ORDER — GABAPENTIN 800 MG PO TABS
800.0000 mg | ORAL_TABLET | Freq: Every day | ORAL | Status: DC
  Administered 2017-10-11 (×2): 800 mg via ORAL
  Filled 2017-10-11 (×6): qty 1

## 2017-10-11 MED ORDER — INSULIN NPH (HUMAN) (ISOPHANE) 100 UNIT/ML ~~LOC~~ SUSP
14.0000 [IU] | Freq: Two times a day (BID) | SUBCUTANEOUS | Status: DC
  Filled 2017-10-11: qty 10

## 2017-10-11 MED ORDER — GABAPENTIN 300 MG PO CAPS
ORAL_CAPSULE | ORAL | Status: AC
  Filled 2017-10-11: qty 2

## 2017-10-11 MED ORDER — METFORMIN HCL ER 500 MG PO TB24
500.0000 mg | ORAL_TABLET | Freq: Two times a day (BID) | ORAL | Status: DC
  Administered 2017-10-11 – 2017-10-12 (×2): 500 mg via ORAL
  Filled 2017-10-11 (×8): qty 1

## 2017-10-11 MED ORDER — GABAPENTIN 400 MG PO CAPS
ORAL_CAPSULE | ORAL | Status: AC
  Filled 2017-10-11: qty 2

## 2017-10-11 NOTE — BH Assessment (Addendum)
[place] Assessment Progress Note  Pt accepted to [facility] 303-2 after 10 am tomorrow 12/26 per [place], [facility]. Pt accepted by Heloise Purpura, NP to Dr. Parke Poisson. Notified ED staff at [facility].
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
## 2017-10-11 NOTE — ED Triage Notes (Signed)
Pt brought in EMS, pt reports SI and reports "if I get a bottle of pills ill overdose." pt denies HI. CBG en route 206.

## 2017-10-11 NOTE — ED Provider Notes (Signed)
[facility] EMERGENCY DEPARTMENT Provider Note   CSN: [id number] Arrival date & time: 10/11/17  1350     History   Chief Complaint Chief Complaint  Patient presents with  . V70.1    HPI [name] is a 56 y.o. male.  The history is provided by the patient and the EMS personnel. The history is limited by the condition of the patient (Psychiatric condition).    Pt was seen at 1355. Per EMS and pt report: Pt c/o SI for an unknown period of time. Pt states he will either hang himself or overdose on pills. Denies any other complaints. Denies SA, no HI, no hallucinations. Pt not forthcoming regarding more information.   Past Medical History:  Diagnosis Date  . Alcohol dependence ([place]) 1979   stated abusing ETOH at age 35   . Back pain   . Benzodiazepine dependence ([place])   . Cardiac arrest ([place])   . Chronic back pain   . Cocaine abuse ([place])   . COPD (chronic obstructive pulmonary disease) ([place])   . Diabetes mellitus without complication ([place]) 6503  . Hypertension 2008  . MI (myocardial infarction) ([place])   . Non-compliance   . Schizoaffective disorder ([place]) 2006   . Substance abuse [facility])     Patient Active Problem List   Diagnosis Date Noted  . Diabetic neuropathy ([place]) 11/11/2016  . Anxiety and depression 11/11/2016  . Chronic insomnia 01/08/2016  . Alcohol use disorder 12/10/2015  . Benzodiazepine abuse ([place]) 12/10/2015  . Opioid abuse ([place]) 12/10/2015  . Constipation 02/06/2015  . BPH (benign prostatic hyperplasia) 01/16/2015  . Vitamin D deficiency 01/16/2015  . Allergic rhinitis 11/19/2014  . Chronic low back pain 11/07/2014  . Onychomycosis of toenail 11/07/2014  . Tinea pedis 11/07/2014  . Psoriasis 10/14/2014  . HTN (hypertension) 10/14/2014  . Diabetes mellitus type 2, controlled ([place]) 10/14/2014  . COPD (chronic obstructive pulmonary disease) ([place]) 10/14/2014  . Tobacco abuse 10/14/2014  . HLD (hyperlipidemia) 10/14/2014  . Schizoaffective disorder,  depressive type ([place]) 10/14/2014    Past Surgical History:  Procedure Laterality Date  . CYST EXCISION         Home Medications    Prior to Admission medications   Medication Sig Start Date End Date Taking? Authorizing Provider  albuterol (PROVENTIL HFA;VENTOLIN HFA) 108 (90 Base) MCG/ACT inhaler Inhale 2 puffs into the lungs every 6 (six) hours as needed for wheezing or shortness of breath. Patient taking differently: Inhale 2 puffs into the lungs every 4 (four) hours as needed for wheezing or shortness of breath.  11/11/16   Dettinger, Fransisca Kaufmann, MD  aspirin EC 81 MG tablet Take 1 tablet (81 mg total) by mouth daily. Reported on 01/02/2016 02/16/16   Boykin Nearing, MD  atorvastatin (LIPITOR) 40 MG tablet TAKE 1 TABLET BY MOUTH EVERY DAY - EMERGENCY REFILL FAXED DR 12/17/16   Dettinger, Fransisca Kaufmann, MD  Blood Glucose Monitoring Suppl (BLOOD GLUCOSE MONITOR SYSTEM) w/Device KIT 1 each by Does not apply route daily. 11/11/16   Dettinger, Fransisca Kaufmann, MD  docusate sodium (COLACE) 100 MG capsule Take 100 mg by mouth 2 (two) times daily.    [provider]  fluticasone (FLONASE) 50 MCG/ACT nasal spray PLACE TWO   SPRAYS INTO BOTH NOSTRILS DAILY. 02/20/16   Funches, Adriana Mccallum, MD  Fluticasone-Salmeterol (ADVAIR) 100-50 MCG/DOSE AEPB Inhale 1 puff into the lungs 2 (two) times daily. Patient taking differently: Inhale 2 puffs into the lungs 2 (two) times daily.  11/11/16  Dettinger, Fransisca Kaufmann, MD  gabapentin (NEURONTIN) 800 MG tablet Take 1 tablet (800 mg total) by mouth 5 (five) times daily. 12/13/16   Dettinger, Fransisca Kaufmann, MD  Glucose Blood (BLOOD GLUCOSE TEST STRIPS) STRP 1 strip by In Vitro route 4 (four) times daily as needed. 11/11/16   Dettinger, Fransisca Kaufmann, MD  insulin aspart (NOVOLOG FLEXPEN) 100 UNIT/ML FlexPen Inject 14-20 Units into the skin 2 (two) times daily before a meal. Inject 20 units in the morning, and inject 14 units before dinner    [provider]  insulin NPH Human (HUMULIN  N,NOVOLIN N) 100 UNIT/ML injection Inject 14-20 Units into the skin 2 (two) times daily before a meal. Inject 20 units in the morning, and inject 14 units 15 minutes before bedtime    [provider]  Insulin Syringe-Needle U-100 (B-D INS SYRINGE 0.5CC/31GX5/16) 31G X 5/16" 0.5 ML MISC 1 each by Does not apply route 3 (three) times daily. Patient taking differently: 1 each by Does not apply route 4 (four) times daily.  03/18/16   Funches, Adriana Mccallum, MD  isosorbide dinitrate (ISORDIL) 30 MG tablet Take 30 mg by mouth daily.    [provider]  lisinopril (PRINIVIL,ZESTRIL) 20 MG tablet Take 1 tablet (20 mg total) by mouth daily. 12/13/16   Dettinger, Fransisca Kaufmann, MD  metFORMIN (GLUCOPHAGE-XR) 500 MG 24 hr tablet Take 500 mg by mouth 2 (two) times daily.    [provider]  omeprazole (PRILOSEC) 20 MG capsule Take 20 mg by mouth daily.    [provider]  ondansetron (ZOFRAN-ODT) 4 MG disintegrating tablet Take 4 mg by mouth every 6 (six) hours as needed for nausea or vomiting.    [provider]  paliperidone (INVEGA SUSTENNA) 234 MG/1.5ML SUSP injection Inject 234 mg into the muscle every 30 (thirty) days.    [provider]  tamsulosin (FLOMAX) 0.4 MG CAPS capsule Take 0.4 mg by mouth daily.    [provider]  traMADol-acetaminophen (ULTRACET) 37.5-325 MG tablet Take 1 tablet by mouth every 8 (eight) hours as needed.    [provider]  zolpidem (AMBIEN) 10 MG tablet Take 10 mg by mouth at bedtime as needed for sleep.    [provider]    Family History Family History  Problem Relation Age of Onset  . Heart disease Mother   . Heart disease Father   . Heart disease Maternal Grandmother   . Diabetes Maternal Grandmother     Social History Social History   Tobacco Use  . Smoking status: Current Every Day Smoker    Packs/day: 1.50    Types: Cigarettes  . Smokeless tobacco: Never Used  Substance Use Topics  .  Alcohol use: No    Alcohol/week: 0.0 oz    Comment: quit  . Drug use: Yes    Types: Cocaine    Comment: 08/24/17     Allergies   Quetiapine; Seroquel [quetiapine fumarate]; Trazodone; Trazodone and nefazodone; and Haldol [haloperidol lactate]   Review of Systems Review of Systems  Unable to perform ROS: Psychiatric disorder     Physical Exam Updated Vital Signs Ht '5\' 11"'  (1.803 m)   Wt 97.5 kg (215 lb)   BMI 29.99 kg/m   Physical Exam 1400:   Physical examination:  Nursing notes reviewed; Vital signs and O2 SAT reviewed;  Constitutional: Well developed, Well nourished, Well hydrated, In no acute distress; Head:  Normocephalic, atraumatic; Eyes: EOMI, PERRL, No scleral icterus; ENMT: Mouth and pharynx normal, Mucous  membranes moist; Neck: Supple, Full range of motion; Cardiovascular: Regular rate and rhythm; Respiratory: Breath sounds clear, No wheezes.  Speaking full sentences with ease, Normal respiratory effort/excursion; Chest: No deformity, Movement normal; Abdomen: Nondistended; Extremities: No deformity.; Neuro: Awake, alert, rambling and tangential. No facial droop. Major CN grossly intact.  Speech clear. No gross focal motor deficits in extremities. Climbs on and off stretcher easily by himself. Gait steady.; Skin: Color normal, Warm, Dry.; Psych:  Endorses SI.    ED Treatments / Results  Labs (all labs ordered are listed, but only abnormal results are displayed)   EKG  EKG Interpretation None       Radiology   Procedures Procedures (including critical care time)  Medications Ordered in ED Medications - No data to display   Initial Impression / Assessment and Plan / ED Course  I have reviewed the triage vital signs and the nursing notes.  Pertinent labs & imaging results that were available during my care of the patient were reviewed by me and considered in my medical decision making (see chart for details).  MDM Reviewed: previous chart, nursing note  and vitals Reviewed previous: labs Interpretation: labs    Results for orders placed or performed during the hospital encounter of 10/11/17  Comprehensive metabolic panel  Result Value Ref Range   Sodium 132 (L) 135 - 145 mmol/L   Potassium 3.5 3.5 - 5.1 mmol/L   Chloride 98 (L) 101 - 111 mmol/L   CO2 24 22 - 32 mmol/L   Glucose, Bld 197 (H) 65 - 99 mg/dL   BUN 15 6 - 20 mg/dL   Creatinine, Ser 1.20 0.61 - 1.24 mg/dL   Calcium 9.6 8.9 - 10.3 mg/dL   Total Protein 7.2 6.5 - 8.1 g/dL   Albumin 4.1 3.5 - 5.0 g/dL   AST 14 (L) 15 - 41 U/L   ALT 15 (L) 17 - 63 U/L   Alkaline Phosphatase 81 38 - 126 U/L   Total Bilirubin 1.0 0.3 - 1.2 mg/dL   GFR calc non Af Amer >60 >60 mL/min   GFR calc Af Amer >60 >60 mL/min   Anion gap 10 5 - 15  Ethanol  Result Value Ref Range   Alcohol, Ethyl (B) <57 <32 mg/dL  Salicylate level  Result Value Ref Range   Salicylate Lvl <2.0 2.8 - 30.0 mg/dL  Acetaminophen level  Result Value Ref Range   Acetaminophen (Tylenol), Serum <10 (L) 10 - 30 ug/mL  cbc  Result Value Ref Range   WBC 6.4 4.0 - 10.5 K/uL   RBC 5.02 4.22 - 5.81 MIL/uL   Hemoglobin 16.8 13.0 - 17.0 g/dL   HCT 47.2 39.0 - 52.0 %   MCV 94.0 78.0 - 100.0 fL   MCH 33.5 26.0 - 34.0 pg   MCHC 35.6 30.0 - 36.0 g/dL   RDW 14.3 11.5 - 15.5 %   Platelets 139 (L) 150 - 400 K/uL    1505:  Pt rambling historian, keeps repeating he is suicidal.  Pt is voluntary at this time.  Will need TTS consult. Holding orders written.       Final Clinical Impressions(s) / ED Diagnoses   Final diagnoses:  Suicidal ideation    ED Discharge Orders    None        Francine Graven, DO 10/11/17 1506

## 2017-10-12 ENCOUNTER — Encounter (HOSPITAL_COMMUNITY): Payer: Self-pay | Admitting: *Deleted

## 2017-10-12 ENCOUNTER — Inpatient Hospital Stay (HOSPITAL_COMMUNITY)
Admission: AD | Admit: 2017-10-12 | Discharge: 2017-10-19 | DRG: 885 | Disposition: A | Discharge status: Home / Self Care | Payer: Medicare Other | Source: Intra-hospital | Attending: Psychiatry | Admitting: Psychiatry

## 2017-10-12 ENCOUNTER — Other Ambulatory Visit: Payer: Self-pay

## 2017-10-12 DIAGNOSIS — I252 Old myocardial infarction: Secondary | ICD-10-CM

## 2017-10-12 DIAGNOSIS — K219 Gastro-esophageal reflux disease without esophagitis: Secondary | ICD-10-CM | POA: Diagnosis present

## 2017-10-12 DIAGNOSIS — Z79899 Other long term (current) drug therapy: Secondary | ICD-10-CM

## 2017-10-12 DIAGNOSIS — Z9119 Patient's noncompliance with other medical treatment and regimen: Secondary | ICD-10-CM

## 2017-10-12 DIAGNOSIS — F251 Schizoaffective disorder, depressive type: Secondary | ICD-10-CM | POA: Diagnosis not present

## 2017-10-12 DIAGNOSIS — R451 Restlessness and agitation: Secondary | ICD-10-CM | POA: Diagnosis not present

## 2017-10-12 DIAGNOSIS — Z7951 Long term (current) use of inhaled steroids: Secondary | ICD-10-CM | POA: Diagnosis not present

## 2017-10-12 DIAGNOSIS — Z590 Homelessness: Secondary | ICD-10-CM | POA: Diagnosis not present

## 2017-10-12 DIAGNOSIS — Z7982 Long term (current) use of aspirin: Secondary | ICD-10-CM | POA: Diagnosis not present

## 2017-10-12 DIAGNOSIS — R450 Nervousness: Secondary | ICD-10-CM | POA: Diagnosis not present

## 2017-10-12 DIAGNOSIS — R45851 Suicidal ideations: Secondary | ICD-10-CM | POA: Diagnosis not present

## 2017-10-12 DIAGNOSIS — Z888 Allergy status to other drugs, medicaments and biological substances status: Secondary | ICD-10-CM

## 2017-10-12 DIAGNOSIS — F1020 Alcohol dependence, uncomplicated: Secondary | ICD-10-CM | POA: Diagnosis present

## 2017-10-12 DIAGNOSIS — F99 Mental disorder, not otherwise specified: Secondary | ICD-10-CM | POA: Diagnosis not present

## 2017-10-12 DIAGNOSIS — N400 Enlarged prostate without lower urinary tract symptoms: Secondary | ICD-10-CM | POA: Diagnosis present

## 2017-10-12 DIAGNOSIS — G8929 Other chronic pain: Secondary | ICD-10-CM | POA: Diagnosis present

## 2017-10-12 DIAGNOSIS — Z8674 Personal history of sudden cardiac arrest: Secondary | ICD-10-CM | POA: Diagnosis not present

## 2017-10-12 DIAGNOSIS — F5104 Psychophysiologic insomnia: Secondary | ICD-10-CM | POA: Diagnosis present

## 2017-10-12 DIAGNOSIS — Z794 Long term (current) use of insulin: Secondary | ICD-10-CM

## 2017-10-12 DIAGNOSIS — F332 Major depressive disorder, recurrent severe without psychotic features: Secondary | ICD-10-CM | POA: Diagnosis present

## 2017-10-12 DIAGNOSIS — J449 Chronic obstructive pulmonary disease, unspecified: Secondary | ICD-10-CM | POA: Diagnosis present

## 2017-10-12 DIAGNOSIS — E785 Hyperlipidemia, unspecified: Secondary | ICD-10-CM | POA: Diagnosis present

## 2017-10-12 DIAGNOSIS — E119 Type 2 diabetes mellitus without complications: Secondary | ICD-10-CM | POA: Diagnosis not present

## 2017-10-12 DIAGNOSIS — J432 Centrilobular emphysema: Secondary | ICD-10-CM

## 2017-10-12 DIAGNOSIS — F17210 Nicotine dependence, cigarettes, uncomplicated: Secondary | ICD-10-CM | POA: Diagnosis present

## 2017-10-12 DIAGNOSIS — I10 Essential (primary) hypertension: Secondary | ICD-10-CM | POA: Diagnosis present

## 2017-10-12 DIAGNOSIS — F39 Unspecified mood [affective] disorder: Secondary | ICD-10-CM | POA: Diagnosis not present

## 2017-10-12 DIAGNOSIS — F419 Anxiety disorder, unspecified: Secondary | ICD-10-CM | POA: Diagnosis not present

## 2017-10-12 DIAGNOSIS — G4700 Insomnia, unspecified: Secondary | ICD-10-CM | POA: Diagnosis not present

## 2017-10-12 DIAGNOSIS — Z560 Unemployment, unspecified: Secondary | ICD-10-CM | POA: Diagnosis not present

## 2017-10-12 DIAGNOSIS — F1410 Cocaine abuse, uncomplicated: Secondary | ICD-10-CM | POA: Diagnosis not present

## 2017-10-12 DIAGNOSIS — F410 Panic disorder [episodic paroxysmal anxiety] without agoraphobia: Secondary | ICD-10-CM | POA: Diagnosis not present

## 2017-10-12 LAB — HEMOGLOBIN A1C
HEMOGLOBIN A1C: 8.1 % — AB (ref 4.8–5.6)
MEAN PLASMA GLUCOSE: 185.77 mg/dL

## 2017-10-12 LAB — GLUCOSE, CAPILLARY
GLUCOSE-CAPILLARY: 210 mg/dL — AB (ref 65–99)
Glucose-Capillary: 229 mg/dL — ABNORMAL HIGH (ref 65–99)

## 2017-10-12 LAB — RAPID URINE DRUG SCREEN, HOSP PERFORMED
AMPHETAMINES: NOT DETECTED
BARBITURATES: NOT DETECTED
Benzodiazepines: NOT DETECTED
Cocaine: NOT DETECTED
Opiates: NOT DETECTED
TETRAHYDROCANNABINOL: NOT DETECTED

## 2017-10-12 LAB — CBG MONITORING, ED: Glucose-Capillary: 244 mg/dL — ABNORMAL HIGH (ref 65–99)

## 2017-10-12 MED ORDER — METFORMIN HCL ER 500 MG PO TB24
500.0000 mg | ORAL_TABLET | Freq: Two times a day (BID) | ORAL | Status: DC
  Administered 2017-10-12 – 2017-10-19 (×14): 500 mg via ORAL
  Filled 2017-10-12 (×17): qty 1

## 2017-10-12 MED ORDER — INSULIN ASPART 100 UNIT/ML ~~LOC~~ SOLN
20.0000 [IU] | Freq: Every day | SUBCUTANEOUS | Status: DC
  Administered 2017-10-12: 20 [IU] via SUBCUTANEOUS
  Filled 2017-10-12: qty 1

## 2017-10-12 MED ORDER — ATORVASTATIN CALCIUM 40 MG PO TABS
40.0000 mg | ORAL_TABLET | Freq: Every day | ORAL | Status: DC
  Administered 2017-10-12 – 2017-10-18 (×6): 40 mg via ORAL
  Filled 2017-10-12 (×8): qty 1

## 2017-10-12 MED ORDER — ZOLPIDEM TARTRATE 10 MG PO TABS
10.0000 mg | ORAL_TABLET | Freq: Every evening | ORAL | Status: DC | PRN
  Administered 2017-10-12 – 2017-10-18 (×7): 10 mg via ORAL
  Filled 2017-10-12 (×7): qty 1

## 2017-10-12 MED ORDER — ASPIRIN EC 81 MG PO TBEC
81.0000 mg | DELAYED_RELEASE_TABLET | Freq: Every day | ORAL | Status: DC
  Administered 2017-10-13 – 2017-10-19 (×7): 81 mg via ORAL
  Filled 2017-10-12 (×9): qty 1

## 2017-10-12 MED ORDER — PANTOPRAZOLE SODIUM 40 MG PO TBEC
40.0000 mg | DELAYED_RELEASE_TABLET | Freq: Every day | ORAL | Status: DC
  Administered 2017-10-13 – 2017-10-19 (×7): 40 mg via ORAL
  Filled 2017-10-12 (×9): qty 1

## 2017-10-12 MED ORDER — INSULIN ASPART 100 UNIT/ML ~~LOC~~ SOLN: 14.0000 [IU] | Freq: Two times a day (BID) | SUBCUTANEOUS | Status: DC

## 2017-10-12 MED ORDER — INSULIN NPH (HUMAN) (ISOPHANE) 100 UNIT/ML ~~LOC~~ SUSP
20.0000 [IU] | Freq: Every day | SUBCUTANEOUS | Status: DC
  Administered 2017-10-12: 20 [IU] via SUBCUTANEOUS
  Filled 2017-10-12: qty 10

## 2017-10-12 MED ORDER — LISINOPRIL 20 MG PO TABS
20.0000 mg | ORAL_TABLET | Freq: Every day | ORAL | Status: DC
  Administered 2017-10-13 – 2017-10-19 (×7): 20 mg via ORAL
  Filled 2017-10-12 (×9): qty 1

## 2017-10-12 MED ORDER — ALBUTEROL SULFATE HFA 108 (90 BASE) MCG/ACT IN AERS
2.0000 | INHALATION_SPRAY | RESPIRATORY_TRACT | Status: DC | PRN
  Administered 2017-10-16: 2 via RESPIRATORY_TRACT
  Filled 2017-10-12: qty 6.7

## 2017-10-12 MED ORDER — INSULIN NPH (HUMAN) (ISOPHANE) 100 UNIT/ML ~~LOC~~ SUSP
14.0000 [IU] | Freq: Every day | SUBCUTANEOUS | Status: DC
  Filled 2017-10-12: qty 10

## 2017-10-12 MED ORDER — CLONAZEPAM 0.5 MG PO TABS
0.5000 mg | ORAL_TABLET | Freq: Three times a day (TID) | ORAL | Status: DC | PRN
  Administered 2017-10-12 – 2017-10-13 (×3): 0.5 mg via ORAL
  Filled 2017-10-12 (×3): qty 1

## 2017-10-12 MED ORDER — GABAPENTIN 400 MG PO CAPS
800.0000 mg | ORAL_CAPSULE | Freq: Every day | ORAL | Status: DC
  Administered 2017-10-12: 800 mg via ORAL
  Filled 2017-10-12 (×2): qty 2

## 2017-10-12 MED ORDER — INSULIN ASPART 100 UNIT/ML ~~LOC~~ SOLN: 14.0000 [IU] | Freq: Every day | SUBCUTANEOUS | Status: DC

## 2017-10-12 MED ORDER — NICOTINE 21 MG/24HR TD PT24
21.0000 mg | MEDICATED_PATCH | Freq: Every day | TRANSDERMAL | Status: DC
  Administered 2017-10-12 – 2017-10-19 (×8): 21 mg via TRANSDERMAL
  Filled 2017-10-12 (×9): qty 1

## 2017-10-12 MED ORDER — ONDANSETRON 4 MG PO TBDP: 4.0000 mg | ORAL_TABLET | Freq: Four times a day (QID) | ORAL | Status: DC | PRN

## 2017-10-12 MED ORDER — ACETAMINOPHEN 325 MG PO TABS
650.0000 mg | ORAL_TABLET | Freq: Four times a day (QID) | ORAL | Status: DC | PRN
  Administered 2017-10-12 – 2017-10-17 (×6): 650 mg via ORAL
  Filled 2017-10-12 (×6): qty 2

## 2017-10-12 MED ORDER — INSULIN ASPART 100 UNIT/ML ~~LOC~~ SOLN
0.0000 [IU] | Freq: Three times a day (TID) | SUBCUTANEOUS | Status: DC
  Administered 2017-10-12: 5 [IU] via SUBCUTANEOUS
  Administered 2017-10-13 (×2): 3 [IU] via SUBCUTANEOUS
  Administered 2017-10-13 – 2017-10-15 (×4): 5 [IU] via SUBCUTANEOUS
  Administered 2017-10-15 – 2017-10-17 (×3): 3 [IU] via SUBCUTANEOUS
  Administered 2017-10-18 – 2017-10-19 (×3): 5 [IU] via SUBCUTANEOUS

## 2017-10-12 MED ORDER — INSULIN ASPART 100 UNIT/ML ~~LOC~~ SOLN: 0.0000 [IU] | Freq: Three times a day (TID) | SUBCUTANEOUS | Status: DC

## 2017-10-12 MED ORDER — ISOSORBIDE DINITRATE 10 MG PO TABS
30.0000 mg | ORAL_TABLET | Freq: Every day | ORAL | Status: DC
  Administered 2017-10-13 – 2017-10-19 (×6): 30 mg via ORAL
  Filled 2017-10-12 (×9): qty 3

## 2017-10-12 MED ORDER — TAMSULOSIN HCL 0.4 MG PO CAPS
0.4000 mg | ORAL_CAPSULE | Freq: Every day | ORAL | Status: DC
  Administered 2017-10-13 – 2017-10-19 (×7): 0.4 mg via ORAL
  Filled 2017-10-12 (×9): qty 1

## 2017-10-12 NOTE — Progress Notes (Signed)
Patient ID: [name], male   DOB: [birth year], 56 y.o.   MRN: [id number] PER STATE REGULATIONS 482.30  THIS CHART WAS REVIEWED FOR MEDICAL NECESSITY WITH RESPECT TO THE PATIENT'S ADMISSION/DURATION OF STAY.  NEXT REVIEW DATE: 10/16/17  Roma Schanz, RN, BSN CASE MANAGER

## 2017-10-12 NOTE — ED Notes (Signed)
Pt asking for more Klonopin. Pt informed he has no more scheduled medications due at this time. Pt was given drink & crackers for a snack.

## 2017-10-12 NOTE — ED Notes (Signed)
[facility] transport called to transfer patient to [facility].

## 2017-10-12 NOTE — Progress Notes (Signed)
Inpatient Diabetes Program Recommendations  AACE/ADA: New Consensus Statement on Inpatient Glycemic Control (2015)  Target Ranges:  Prepandial:   less than 140 mg/dL      Peak postprandial:   less than 180 mg/dL (1-2 hours)      Critically ill patients:  140 - 180 mg/dL   Lab Results  Component Value Date   GLUCAP 244 (H) 10/12/2017   HGBA1C 6.6 (H) 12/11/2015    Review of Glycemic Control Results for [name], [name] (MRN [id number]) as of 10/12/2017 10:11  Ref. Range 10/11/2017 14:11 10/11/2017 19:16 10/12/2017 08:15  Glucose-Capillary Latest Ref Range: 65 - 99 mg/dL  258 (H) 244 (H)   Diabetes history: Type 2 DM Outpatient Diabetes medications: Novolog 14-20 Units BID (20 Units in AM, 14 Units with supper), NPH 14-20 Units BID (20 Units in AM, 14 Units HS), Metformin 500 mg BID Current orders for Inpatient glycemic control: Novolog 14-20 Units BID (20 Units in AM, 14 Units with supper), NPH 14-20 Units BID (20 Units in AM, 14 Units HS), Metformin 500 mg BID   Inpatient Diabetes Program Recommendations:    Awaiting transfer to IP psych. If appropriate, consider adding Novolog 0-9 Units sensitive correction. Also, noted last A1C is from 2017; may consider repeating.  Thanks, Bronson Curb, MSN, RNC-OB Diabetes Coordinator [phone] (8a-5p)

## 2017-10-12 NOTE — ED Notes (Signed)
Pt reports that he has shingles on his back.  Three bumps noted to left scapular, MD notified.

## 2017-10-12 NOTE — ED Notes (Signed)
Have contacted [facility] about pt.'s UDS

## 2017-10-12 NOTE — ED Notes (Signed)
Pharmacy called to review Med Rec.

## 2017-10-12 NOTE — Tx Team (Signed)
Initial Treatment Plan 10/12/2017 2:48 PM [name] TXH:[id number]    PATIENT STRESSORS: Financial difficulties Health problems Loss of mother   PATIENT STRENGTHS: Ability for insight Average or above average intelligence General fund of knowledge Motivation for treatment/growth   PATIENT IDENTIFIED PROBLEMS: Depression Anxiety Suicidal thoughts "I want to get my medications straightened out"                     DISCHARGE CRITERIA:  Ability to meet basic life and health needs Improved stabilization in mood, thinking, and/or behavior Reduction of life-threatening or endangering symptoms to within safe limits Verbal commitment to aftercare and medication compliance  PRELIMINARY DISCHARGE PLAN: Attend aftercare/continuing care group Placement in alternative living arrangements  PATIENT/FAMILY INVOLVEMENT: This treatment plan has been presented to and reviewed with the patient, [name], and/or family member, .  The patient and family have been given the opportunity to ask questions and make suggestions.  [place], [place], [place] 10/12/2017, 2:48 PM

## 2017-10-12 NOTE — Progress Notes (Signed)
Moderate sliding scale order obtained from Dr. Parke Poisson until pt is assessed by providers tomorrow 12/27. Will obtain an A1c this evening per Dr. Parke Poisson. Order placed and witnessed by Chrys Racer, Therapist, sports.

## 2017-10-12 NOTE — Progress Notes (Signed)
Psychoeducational Group Note  Date:  10/12/2017 Time:  2224  Group Topic/Focus:  Wrap-Up Group:   The focus of this group is to help patients review their daily goal of treatment and discuss progress on daily workbooks.  Participation Level: Did Not Attend  Participation Quality:  Not Applicable  Affect:  Not Applicable  Cognitive:  Not Applicable  Insight:  Not Applicable  Engagement in Group: Not Applicable  Additional Comments:  The patient did not attend group this evening since he refused to attend the session.   Archie Balboa S 10/12/2017, 10:24 PM

## 2017-10-12 NOTE — Progress Notes (Signed)
[place] left Heloise Purpura, NP., accepting provider in regards to pt needing insulin and CBG orders.

## 2017-10-12 NOTE — Progress Notes (Signed)
[name] is a 56 year old male pt admitted on voluntary basis. On admission, [name] presents as anxious and irritable and spoke about how he has been feeling depressed and suicidal. He does endorse passive SI on admission and able to contract for safety on the unit. He spoke about how he is taking his medications but reports that his doctor will only prescribe 7 days at a time because of overdosing in the past and reports that he does not have transportation to get his medications every week. He denies any current alcohol usage and reports that he has not had a drink in awhile but does endorse some occasional cocaine usage. He reports that he is homeless and reports that he would like to get into a group home or rest home when he gets discharged. [name] was oriented to the unit and safety maintained.

## 2017-10-13 ENCOUNTER — Other Ambulatory Visit: Payer: Self-pay | Admitting: Family Medicine

## 2017-10-13 DIAGNOSIS — Z794 Long term (current) use of insulin: Principal | ICD-10-CM

## 2017-10-13 DIAGNOSIS — F251 Schizoaffective disorder, depressive type: Principal | ICD-10-CM

## 2017-10-13 DIAGNOSIS — F410 Panic disorder [episodic paroxysmal anxiety] without agoraphobia: Secondary | ICD-10-CM

## 2017-10-13 DIAGNOSIS — F17210 Nicotine dependence, cigarettes, uncomplicated: Secondary | ICD-10-CM

## 2017-10-13 DIAGNOSIS — R450 Nervousness: Secondary | ICD-10-CM

## 2017-10-13 DIAGNOSIS — Z560 Unemployment, unspecified: Secondary | ICD-10-CM

## 2017-10-13 DIAGNOSIS — E119 Type 2 diabetes mellitus without complications: Secondary | ICD-10-CM

## 2017-10-13 DIAGNOSIS — F419 Anxiety disorder, unspecified: Secondary | ICD-10-CM

## 2017-10-13 DIAGNOSIS — G4700 Insomnia, unspecified: Secondary | ICD-10-CM

## 2017-10-13 DIAGNOSIS — R45851 Suicidal ideations: Secondary | ICD-10-CM

## 2017-10-13 LAB — GLUCOSE, CAPILLARY
GLUCOSE-CAPILLARY: 197 mg/dL — AB (ref 65–99)
GLUCOSE-CAPILLARY: 243 mg/dL — AB (ref 65–99)
Glucose-Capillary: 217 mg/dL — ABNORMAL HIGH (ref 65–99)
Glucose-Capillary: 192 mg/dL — ABNORMAL HIGH (ref 65–99)

## 2017-10-13 MED ORDER — CLONAZEPAM 1 MG PO TABS
1.0000 mg | ORAL_TABLET | Freq: Two times a day (BID) | ORAL | Status: DC
  Administered 2017-10-13 – 2017-10-19 (×12): 1 mg via ORAL
  Filled 2017-10-13 (×12): qty 1

## 2017-10-13 MED ORDER — INSULIN NPH (HUMAN) (ISOPHANE) 100 UNIT/ML ~~LOC~~ SUSP
10.0000 [IU] | Freq: Two times a day (BID) | SUBCUTANEOUS | Status: DC
  Administered 2017-10-14 – 2017-10-19 (×9): 10 [IU] via SUBCUTANEOUS

## 2017-10-13 MED ORDER — GABAPENTIN 300 MG PO CAPS
300.0000 mg | ORAL_CAPSULE | Freq: Three times a day (TID) | ORAL | Status: DC
  Filled 2017-10-13 (×3): qty 1

## 2017-10-13 MED ORDER — METHOCARBAMOL 1000 MG/10ML IJ SOLN: 500.0000 mg | Freq: Three times a day (TID) | INTRAVENOUS | Status: DC | PRN

## 2017-10-13 MED ORDER — GABAPENTIN 400 MG PO CAPS
400.0000 mg | ORAL_CAPSULE | Freq: Three times a day (TID) | ORAL | Status: DC
  Filled 2017-10-13 (×2): qty 1

## 2017-10-13 MED ORDER — IBUPROFEN 800 MG PO TABS
800.0000 mg | ORAL_TABLET | Freq: Four times a day (QID) | ORAL | Status: DC | PRN
  Administered 2017-10-13 – 2017-10-19 (×16): 800 mg via ORAL
  Filled 2017-10-13 (×16): qty 1

## 2017-10-13 MED ORDER — METHOCARBAMOL 500 MG PO TABS
500.0000 mg | ORAL_TABLET | Freq: Three times a day (TID) | ORAL | Status: DC | PRN
  Administered 2017-10-13 – 2017-10-18 (×11): 500 mg via ORAL
  Filled 2017-10-13 (×11): qty 1

## 2017-10-13 MED ORDER — PALIPERIDONE PALMITATE 234 MG/1.5ML IM SUSP
234.0000 mg | INTRAMUSCULAR | Status: DC
  Administered 2017-10-14: 234 mg via INTRAMUSCULAR

## 2017-10-13 MED ORDER — GABAPENTIN 400 MG PO CAPS
400.0000 mg | ORAL_CAPSULE | Freq: Three times a day (TID) | ORAL | Status: DC
  Administered 2017-10-13 – 2017-10-19 (×18): 400 mg via ORAL
  Filled 2017-10-13 (×22): qty 1

## 2017-10-13 NOTE — BHH Suicide Risk Assessment (Signed)
[facility] Admission Suicide Risk Assessment   Nursing information obtained from:    Demographic factors:    Current Mental Status:    Loss Factors:    Historical Factors:    Risk Reduction Factors:     Total Time spent with patient: 45 minutes Principal Problem: Schizoaffective disorder, depressive type ([place]) Diagnosis:   Patient Active Problem List   Diagnosis Date Noted  . MDD (major depressive disorder), recurrent severe, without psychosis ([place]) [F33.2] 10/12/2017  . Diabetic neuropathy ([place]) [E11.40] 11/11/2016  . Anxiety and depression [F41.9, F32.9] 11/11/2016  . Chronic insomnia [F51.04] 01/08/2016  . Alcohol use disorder [IMO0002] 12/10/2015  . Benzodiazepine abuse ([place]) [F13.10] 12/10/2015  . Opioid abuse ([place]) [F11.10] 12/10/2015  . Constipation [K59.00] 02/06/2015  . BPH (benign prostatic hyperplasia) [N40.0] 01/16/2015  . Vitamin D deficiency [E55.9] 01/16/2015  . Allergic rhinitis [J30.9] 11/19/2014  . Chronic low back pain [M54.5, G89.29] 11/07/2014  . Onychomycosis of toenail [B35.1] 11/07/2014  . Tinea pedis [B35.3] 11/07/2014  . Psoriasis [L40.9] 10/14/2014  . HTN (hypertension) [I10] 10/14/2014  . Diabetes mellitus type 2, controlled ([place]) [E11.9] 10/14/2014  . COPD (chronic obstructive pulmonary disease) ([place]) [J44.9] 10/14/2014  . Tobacco abuse [Z72.0] 10/14/2014  . HLD (hyperlipidemia) [E78.5] 10/14/2014  . Schizoaffective disorder, depressive type ([place]) [F25.1] 10/14/2014   Subjective Data:  [name] is a 56 y/o M with history of MDD versus schizoaffective disorder depressive type who was admitted voluntarily from [facility] ED where he presented after calling 911 himself to report worsened depression and SI with plan to overdose or hang himself.   Upon interview, pt shares his reasons for coming to the hospital, stating, "I was suicidal and I'm homeless." Pt shares he has also been struggling with stressor of death of his mother in 02/03/2017. He initially  denies other symptoms of depression and states that he is "just suicidal, and I wouldn't be suicidal if I wasn't homeless." Pt denies sleep disturbance, anhedonia, guilty feelings, or appetite changes. He denies symptoms of mania, OCD, and PTSD. He denies recent illicit substance use.  Discussed with patient about treatment options. He notes that he follows with [facility] in [place] and his current medications include [place] 234mg  IM q30 days, klonopin 1mg  TID, and gabapentin 800mg  five times per day. Of note, pt had UDS negative for benzodiazepines on arrival and several weeks ago as well. Pt had last confirmed sustenna injection on December 5th. Discussed with patient that we will restart Kirt Boys and administer next injection slightly early from next scheduled date. We will also restart clonazepam and gabapentin at lower doses. Social work team will look into referrals to shelters/group homes at this time as per pt's request. Pt had no further questions, comments, or concerns.    Continued Clinical Symptoms:  Alcohol Use Disorder Identification Test Final Score (AUDIT): 1 The "Alcohol Use Disorders Identification Test", Guidelines for Use in Primary Care, Second Edition.  World Pharmacologist [facility]). Score between 0-7:  no or low risk or alcohol related problems. Score between 8-15:  moderate risk of alcohol related problems. Score between 16-19:  high risk of alcohol related problems. Score 20 or above:  warrants further diagnostic evaluation for alcohol dependence and treatment.   CLINICAL FACTORS:   Severe Anxiety and/or Agitation Depression:   Severe Chronic Pain More than one psychiatric diagnosis Unstable or Poor Therapeutic Relationship Previous Psychiatric Diagnoses and Treatments Medical Diagnoses and Treatments/Surgeries   Musculoskeletal: Strength & Muscle Tone: within normal limits Gait &  Station: normal Patient leans: N/A  Psychiatric Specialty  Exam: Physical Exam  Nursing note and vitals reviewed.   Review of Systems  Constitutional: Negative for chills and fever.  Respiratory: Negative for cough and shortness of breath.   Cardiovascular: Negative for chest pain.  Gastrointestinal: Negative for abdominal pain, heartburn, nausea and vomiting.  Musculoskeletal: Positive for back pain and joint pain.  Psychiatric/Behavioral: Positive for depression and suicidal ideas. Negative for hallucinations and substance abuse. The patient is nervous/anxious and has insomnia.     Blood pressure (!) 107/57, pulse 61, temperature 97.6 F (36.4 C), temperature source Oral, resp. rate 20, height 5' 9.5" (1.765 m), weight 99.1 kg (218 lb 8 oz).Body mass index is 31.8 kg/m.  General Appearance: Casual  Eye Contact:  Fair  Speech:  Clear and Coherent and Normal Rate  Volume:  Normal  Mood:  Anxious and Irritable  Affect:  Blunt and Labile  Thought Process:  Coherent and Goal Directed  Orientation:  Full (Time, Place, and Person)  Thought Content:  Logical  Suicidal Thoughts:  Yes.  with intent/plan  Homicidal Thoughts:  No  Memory:  Immediate;   Fair Recent;   Fair Remote;   Fair  Judgement:  Poor  Insight:  Lacking  Psychomotor Activity:  Normal  Concentration:  Concentration: Poor  Recall:  Poor  Fund of Knowledge:  Fair  Language:  Fair  Akathisia:  No  Handed:    AIMS (if indicated):     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 Social Support  ADL's:  Intact  Cognition:  WNL  Sleep:  Number of Hours: 5.5      COGNITIVE FEATURES THAT CONTRIBUTE TO RISK:  None    SUICIDE RISK:   Moderate:  Frequent suicidal ideation with limited intensity, and duration, some specificity in terms of plans, no associated intent, good self-control, limited dysphoria/symptomatology, some risk factors present, and identifiable protective factors, including available and accessible social support.  PLAN OF CARE:  - admit to  inpatient psychiatry unit  - Major depressive disorder versus schizoaffective disorder depressive type by history  - Continue Lorayne Bender Sustenna 234mg  IM q30 days (last given 09/21/17, we will schedule next injection for 10/14/17)  - Anxiety  - Change gabapentin 800mg  five times per day to gabapentin 400mg  TID  - Change klonopin 1mg  TID to klonopin 1mg  BID prn anxiety  - Discharge planning will be ongoing - SW team to look into placement/group home options - Encourage participation in groups and therapeutic milieu  I certify that inpatient services furnished can reasonably be expected to improve the patient's condition.   Pennelope Bracken, MD 10/13/2017, 2:45 PM

## 2017-10-13 NOTE — Progress Notes (Signed)
CSW left message for pt's ACT Team at [facility] in attempt to find out when pt last received his [place] shot. CSW requested call back at their earliest convenience. FL2 and Referral screening verification progress form completed today, as pt is interested in group home placement.   Maxie Better, MSW, LCSW Clinical Social Worker 10/13/2017 1:09 PM

## 2017-10-13 NOTE — BHH Suicide Risk Assessment (Signed)
BHH INPATIENT:  Family/Significant Other Suicide Prevention Education  Suicide Prevention Education:  Patient Refusal for Family/Significant Other Suicide Prevention Education: The patient [name] has refused to provide written consent for family/significant other to be provided Family/Significant Other Suicide Prevention Education during admission and/or prior to discharge.  Physician notified.  SPE completed with pt, as pt refused to consent to family contact. SPI pamphlet provided to pt and pt was encouraged to share information with support network, ask questions, and talk about any concerns relating to SPE. Pt denies access to guns/firearms and verbalized understanding of information provided. Mobile Crisis information also provided to pt.   Djimon Lundstrom N Smart LCSW 10/13/2017, 3:29 PM

## 2017-10-13 NOTE — Progress Notes (Signed)
D   Pt is irritable and demanding   He did not attend groups this evening   His interactions with others are limited and he has poor social skills   He endorses depression and anxiety   He asked for medications "all I can get and then some" A    Verbal support given    Medications administered and effectiveness monitored    Q 15 min checks R   Pt is safe at present

## 2017-10-13 NOTE — NC FL2 (Addendum)
[place] LEVEL OF CARE SCREENING TOOL     IDENTIFICATION  Patient Name: [name] Birthdate: 1961-08-25 Sex: male Admission Date (Current Location): 10/12/2017  [place] and [place] Number:  [place] [id number] [place] and Address:  [facility]; [address]. [place], [place] [address])      Provider Number: ([facility] [phone] [phone])  Attending Physician Name and Address:  Dr. Maris Berger MD  Relative Name and Phone Number:  Alfonzo Beers (sister) [phone]    Current Level of Care: Other (Comment)(Homeless) Recommended Level of Care: [facility] Prior Approval Number:    Date Approved/Denied:   Referral ID: [id number]    Discharge Plan: Other (Comment)(group home)    Current Diagnoses: Patient Active Problem List   Diagnosis Date Noted  . MDD (major depressive disorder), recurrent severe, without psychosis ([place]) 10/12/2017  . Diabetic neuropathy ([place]) 11/11/2016  . Anxiety and depression 11/11/2016  . Chronic insomnia 01/08/2016  . Alcohol use disorder 12/10/2015  . Benzodiazepine abuse ([place]) 12/10/2015  . Opioid abuse ([place]) 12/10/2015  . Constipation 02/06/2015  . BPH (benign prostatic hyperplasia) 01/16/2015  . Vitamin D deficiency 01/16/2015  . Allergic rhinitis 11/19/2014  . Chronic low back pain 11/07/2014  . Onychomycosis of toenail 11/07/2014  . Tinea pedis 11/07/2014  . Psoriasis 10/14/2014  . HTN (hypertension) 10/14/2014  . Diabetes mellitus type 2, controlled ([place]) 10/14/2014  . COPD (chronic obstructive pulmonary disease) ([place]) 10/14/2014  . Tobacco abuse 10/14/2014  . HLD (hyperlipidemia) 10/14/2014  . Schizoaffective disorder, depressive type ([place]) 10/14/2014    Orientation RESPIRATION BLADDER Height & Weight     Self, Time, Situation, Place  Normal Continent Weight: 218 lb 8 oz (99.1 kg) Height:  5' 9.5" (176.5 cm)  BEHAVIORAL SYMPTOMS/MOOD NEUROLOGICAL  BOWEL NUTRITION STATUS  (n/a) (n/a) Continent (n/a)  AMBULATORY STATUS COMMUNICATION OF NEEDS Skin   Independent Verbally Normal                       Personal Care Assistance Level of Assistance  (n/a) Bathing Assistance: Independent Feeding assistance: Independent Dressing Assistance: Independent     Functional Limitations Info  (n/a)          SPECIAL CARE FACTORS FREQUENCY  (n/a)                    Contractures Contractures Info: Not present    Additional Factors Info  (n/a)               Current Medications (10/13/2017):  This is the current hospital active medication list Current Facility-Administered Medications  Medication Dose Route Frequency Provider Last Rate Last Dose  . acetaminophen (TYLENOL) tablet 650 mg  650 mg Oral Q6H PRN Benjamine Mola, FNP   650 mg at 10/13/17 0272  . albuterol (PROVENTIL HFA;VENTOLIN HFA) 108 (90 Base) MCG/ACT inhaler 2 puff  2 puff Inhalation Q4H PRN Withrow, Elyse Jarvis, FNP      . aspirin EC tablet 81 mg  81 mg Oral Daily Benjamine Mola, FNP   81 mg at 10/13/17 5366  . atorvastatin (LIPITOR) tablet 40 mg  40 mg Oral q1800 Benjamine Mola, FNP   40 mg at 10/12/17 1656  . clonazePAM (KLONOPIN) tablet 1 mg  1 mg Oral BID Maris Berger T, MD      . gabapentin (NEURONTIN) capsule 400 mg  400 mg Oral TID Pennelope Bracken, MD      .  insulin aspart (novoLOG) injection 0-15 Units  0-15 Units Subcutaneous TID WC Cobos, Myer Peer, MD   3 Units at 10/13/17 1211  . insulin NPH Human (HUMULIN N,NOVOLIN N) injection 10 Units  10 Units Subcutaneous BID AC & HS Nwoko, Agnes I, NP      . isosorbide dinitrate (ISORDIL) tablet 30 mg  30 mg Oral Daily Benjamine Mola, FNP   30 mg at 10/13/17 0811  . lisinopril (PRINIVIL,ZESTRIL) tablet 20 mg  20 mg Oral Daily Benjamine Mola, FNP   20 mg at 10/13/17 2703  . metFORMIN (GLUCOPHAGE-XR) 24 hr tablet 500 mg  500 mg Oral BID Benjamine Mola, FNP   500 mg at 10/13/17 5009  .  nicotine (NICODERM CQ - dosed in mg/24 hours) patch 21 mg  21 mg Transdermal Daily Cobos, Myer Peer, MD   21 mg at 10/13/17 3818  . ondansetron (ZOFRAN-ODT) disintegrating tablet 4 mg  4 mg Oral Q6H PRN Withrow, Elyse Jarvis, FNP      . pantoprazole (PROTONIX) EC tablet 40 mg  40 mg Oral Daily Benjamine Mola, FNP   40 mg at 10/13/17 2993  . tamsulosin (FLOMAX) capsule 0.4 mg  0.4 mg Oral Daily Withrow, Elyse Jarvis, FNP   0.4 mg at 10/13/17 7169  . zolpidem (AMBIEN) tablet 10 mg  10 mg Oral QHS PRN Benjamine Mola, FNP   10 mg at 10/12/17 2107     Discharge Medications: Please see discharge summary for a list of discharge medications.  Relevant Imaging Results:  Relevant Lab Results:   Additional Information n/a  [employer], LCSW

## 2017-10-13 NOTE — [facility] Counselor (Signed)
Adult Comprehensive Assessment  Patient ID: [name], male   DOB: 11-Sep-1961, 56 y.o.   MRN: [id number] e:   Information source: Patient   Current Stressors:  Educational / Learning stressors: Pt did not finish high school  Employment / Job issues: Pt receives SSDI  Family Relationships: Pt family does not live nearby.  Financial / Lack of resources (include bankruptcy): Limited income.  Housing / Lack of housing: Pt lives in a group home.  Physical health (include injuries &life threatening diseases): Pt reports chronic back pain.  Social relationships: None reported  Substance abuse: Pt denies use. However, he has a history of abusing alcohol and anti- freeze.  Bereavement / Loss: None reported   Living/Environment/Situation:  Living Arrangements: reports that he is homeless.  Living conditions (as described by patient or guardian): temporary  How long has patient lived in current situation?: few moths  What is atmosphere in current home: chaotic; unsafe.   Family History:  Marital status: Divorced Divorced, when?: Over 64 years ago  What types of issues is patient dealing with in the relationship?: "She took off with another man"  Are you sexually active?: No What is your sexual orientation?: Heterosexual  Has your sexual activity been affected by drugs, alcohol, medication, or emotional stress?: None reported  Does patient have children?: Yes How many children?: 2 How is patient's relationship with their children?: Adult son and daughter, No contact.  Childhood History:  By whom was/is the patient raised?: Mother Additional childhood history information: Pt's father left when he was 68 years old.  Description of patient's relationship with caregiver when they were a child: Good relationship with mother.  Patient's description of current relationship with people who raised him/her: Good relationship with mother.  How were you disciplined when you got in trouble as a  child/adolescent?: None reported  Does patient have siblings?: Yes Number of Siblings: 1 Description of patient's current relationship with siblings: Sister; good relationship Did patient suffer any verbal/emotional/physical/sexual abuse as a child?: No Did patient suffer from severe childhood neglect?: No Has patient ever been sexually abused/assaulted/raped as an adolescent or adult?: No Was the patient ever a victim of a crime or a disaster?: No Witnessed domestic violence?: No Has patient been effected by domestic violence as an adult?: No  Education:  Highest grade of school patient has completed: 9th Currently a student?: No Learning disability?: No  Employment/Work Situation:  Employment situation: On disability Why is patient on disability: Schizoaffective disorder  How long has patient been on disability: "A long time"  Patient's job has been impacted by current illness: No What is the longest time patient has a held a job?: NA Where was the patient employed at that time?: NA Has patient ever been in the [employer]?: No  Financial Resources:  Museum/gallery curator resources: Teacher, early years/pre, [employer], Medicare Does patient have a Programmer, applications or guardian?: Yes Name of representative payee or guardian: [name] [facility] is his payee   Alcohol/Substance Abuse:  What has been your use of drugs/alcohol within the last 12 months?: He has a history drinking alcohol and anti freeze. intermittent cocaine use.  If attempted suicide, did drugs/alcohol play a role in this?: No Alcohol/Substance Abuse Treatment Hx: Denies past history Has alcohol/substance abuse ever caused legal problems?: No  Social Support System: Patient's Community Support System: Good Describe Community Support System: Family, [facility], hoping to get into group home.  Type of faith/religion: Christainity  How does patient's faith help to cope with current illness?: Prayer  Leisure/Recreation:  Leisure and  Hobbies: fishing   Strengths/Needs:  What things does the patient do well?: Fishing  In what areas does patient struggle / problems for patient: "I got scared because people were shooting outside."   Discharge Plan:  Does patient have access to transportation?: Yes Will patient be returning to same living situation after discharge?: No-pt is hoping to get into group home. Currently receiving community mental health services: Yes (From Whom) ([facility]) Does patient have financial barriers related to discharge medications?: No         Summary/Recommendations:   Summary and Recommendations (to be completed by the evaluator): Patient is 56 yo [place] [facility] [place] [place] [facility] He presents to the hospital seeking treatment for SI, increased depression, and intermittent cocaine abuse. Patient reports ongoing depression due to recent homlessness. Patient reports some paranoia as well. He has a history of schizoaffective disorder. Patient was negative for all substances upon admission. He was last admitted to [facility] 12/10/15 with similar presentation. Recommendations for patient include: crisis stabilization, therapeutic milieu, encourage group attendance and particiption, medication management for mood stabilization, and development of comprehensive mental wellness/sobriety plan. CSW assessing for appropriate referrals.   Kimber Relic Smart LCSW 10/13/2017

## 2017-10-13 NOTE — Progress Notes (Signed)
Assumed care of pt after 2300.  Pt is resting in bed with eyes closed.  No distress is observed.  Respirations even and unlabored.  Safety is maintained with q15 minute checks.

## 2017-10-13 NOTE — Progress Notes (Signed)
Pt presents with a sad affect and a depressed mood. Pt reported no depression this morning but stated that he feels hopeless this morning because he have no where to live. Pt denies SI. Pt denies AVH.Pt verbalized some paranoia about feeling like people are trying to hurt him. Pt mood is labile. Pt appears disheveled. Pt verbalized that he's seeking placement to a group home or rest home. Treatment team made aware of pt request. Pt noted to be med seeking. Pt fixated on taking Klonopin and will request to take it outside of scheduled times. Pt also asking for stronger pain meds other than tylenol or motrin due to chronic back pain. Per Herbert Pun, start pt on Neurontin 300 mg TID for discomfort. Medications reviewed with pt. Medications administered as ordered per MD. Verbal support provided. Pt encouraged to attend groups. 15 minute checks performed for safety, Pt compliant with tx.

## 2017-10-13 NOTE — Progress Notes (Signed)
Inpatient Diabetes Program Recommendations  AACE/ADA: New Consensus Statement on Inpatient Glycemic Control (2015)  Target Ranges:  Prepandial:   less than 140 mg/dL      Peak postprandial:   less than 180 mg/dL (1-2 hours)      Critically ill patients:  140 - 180 mg/dL   Results for BLESS, BELSHE (MRN [id number]) as of 10/13/2017 07:38  Ref. Range 10/12/2017 08:15 10/12/2017 16:49 10/12/2017 20:37  Glucose-Capillary Latest Ref Range: 65 - 99 mg/dL 244 (H) 210 (H) 229 (H)   Results for TUCKER, MINTER (MRN [id number]) as of 10/13/2017 07:38  Ref. Range 10/13/2017 05:51  Glucose-Capillary Latest Ref Range: 65 - 99 mg/dL 217 (H)   Admit with: Suicidal Ideation  History: DM, ETOH Abuse, Schizoaffective d/o  Home DM Meds: Metformin 500 mg BID       NPH Insulin 20 units AM/ 14 units QHS  (listed in MD notes from ED)       Novolog Use as directed (listed in MD notes from ED)  Current Insulin Orders: Metformin 500 mg BID      Novolog Moderate Correction Scale/ SSI (0-15 units) TID AC         MD- Please consider the following in-hospital insulin adjustments:  Start NPH Insulin 10 units BID (with Breakfast and at bedtime)  This dose would be about 0.2 units/kg based on weight of 99kg.  Per ED records from previous visits, patient has NPH insulin listed as outpatient medication.       --Will follow patient during hospitalization--  Wyn Quaker RN, MSN, CDE Diabetes Coordinator Inpatient Glycemic Control Team Team Pager: [phone] (8a-5p)

## 2017-10-13 NOTE — H&P (Signed)
Psychiatric Admission Assessment Adult  Patient Identification: [name]  MRN:  [id number]  Date of Evaluation:  10/13/2017  Chief Complaint: Suicidal ideations with plans to overdose on drugs or hang himself".    Principal Diagnosis: Schizoaffective disorder, depressive type ([place])  Diagnosis:   Patient Active Problem List   Diagnosis Date Noted  . Schizoaffective disorder, depressive type ([place]) [F25.1] 10/14/2014    Priority: High  . MDD (major depressive disorder), recurrent severe, without psychosis ([place]) [F33.2] 10/12/2017  . Diabetic neuropathy ([place]) [E11.40] 11/11/2016  . Anxiety and depression [F41.9, F32.9] 11/11/2016  . Chronic insomnia [F51.04] 01/08/2016  . Alcohol use disorder [IMO0002] 12/10/2015  . Benzodiazepine abuse ([place]) [F13.10] 12/10/2015  . Opioid abuse ([place]) [F11.10] 12/10/2015  . Constipation [K59.00] 02/06/2015  . BPH (benign prostatic hyperplasia) [N40.0] 01/16/2015  . Vitamin D deficiency [E55.9] 01/16/2015  . Allergic rhinitis [J30.9] 11/19/2014  . Chronic low back pain [M54.5, G89.29] 11/07/2014  . Onychomycosis of toenail [B35.1] 11/07/2014  . Tinea pedis [B35.3] 11/07/2014  . Psoriasis [L40.9] 10/14/2014  . HTN (hypertension) [I10] 10/14/2014  . Diabetes mellitus type 2, controlled ([place]) [E11.9] 10/14/2014  . COPD (chronic obstructive pulmonary disease) ([place]) [J44.9] 10/14/2014  . Tobacco abuse [Z72.0] 10/14/2014  . HLD (hyperlipidemia) [E78.5] 10/14/2014   History of Present Illness: This is an admission assessment for this 56 year old Caucasian male with hx of alcoholism. Ad[facility] to the [facility] from the [facility] with complaints of suicidal ideations with plans to overdose on medications or hang himself. His UDS was clear of all substances.  During this assessment, [name] reports, "The ambulance took me to the hospital 3 days ago. I called them. I'm homeless for one thing. I did not feel good on that day as well. I don't remember what  was wrong with me. I think I was feeling like killing myself. Different things brought up the feeling. My mother died in 01/15/23 of this year. My nephew also died. I got very depressed because I missed my mother. Soon after mama died, we lost the home. I have been homeless since. I moved in with my sister, but, my name was not on the lease, so I was made to leave her home. I don't take depression medicines because I don't like them. They make me sick. I had some kind of side effects from them. I don't remember what it did to me. I will need the doctor to call these two places for me ([facility] & [facility]) to see if they can take me in. I'm homeless. My back is killing me. Do you have something stronger than Ibuprofen for my back pain. Can you increase my Klonopin to 1 mg. I got bad anxiety & not depression".  Associated Signs/Symptoms: Depression Symptoms:  anxiety,  (Hypo) Manic Symptoms:  Denies any hypomanic symptoms.  Anxiety Symptoms:  Excessive Worry, Panic Symptoms,  Psychotic Symptoms:  Denies any hallucinations, delusions or paranoia  PTSD Symptoms: NA  Total Time spent with patient: 1 hour  Past Psychiatric History: Bipolar disorder.  Is the patient at risk to self? No.  Has the patient been a risk to self in the past 6 months? No.  Has the patient been a risk to self within the distant past? Yes.    Is the patient a risk to others? No.  Has the patient been a risk to others in the past 6 months? No.  Has the patient been a risk to others within the distant  past? No.   Prior Inpatient Therapy: Yes [facility] for alcoholism). Prior Outpatient Therapy: [facility].  Alcohol Screening: 1. How often do you have a drink containing alcohol?: Monthly or less 2. How many drinks containing alcohol do you have on a typical day when you are drinking?: 1 or 2 3. How often do you have six or more drinks on one occasion?: Never AUDIT-C Score: 1 4. How often during the last  year have you found that you were not able to stop drinking once you had started?: Never 5. How often during the last year have you failed to do what was normally expected from you becasue of drinking?: Never 6. How often during the last year have you needed a first drink in the morning to get yourself going after a heavy drinking session?: Never 7. How often during the last year have you had a feeling of guilt of remorse after drinking?: Never 8. How often during the last year have you been unable to remember what happened the night before because you had been drinking?: Never 9. Have you or someone else been injured as a result of your drinking?: No 10. Has a relative or friend or a doctor or another health worker been concerned about your drinking or suggested you cut down?: No Alcohol Use Disorder Identification Test Final Score (AUDIT): 1 Intervention/Follow-up: AUDIT Score <7 follow-up not indicated  Substance Abuse History in the last 12 months:  No.  Consequences of Substance Abuse: NA  Previous Psychotropic Medications: Yes   Psychological Evaluations: No   Past Medical History:  Past Medical History:  Diagnosis Date  . Alcohol dependence ([place]) 1979   stated abusing ETOH at age 70   . Back pain   . Benzodiazepine dependence ([place])   . Cardiac arrest ([place])   . Chronic back pain   . Cocaine abuse ([place])   . COPD (chronic obstructive pulmonary disease) ([place])   . Diabetes mellitus without complication ([place]) 8127  . Hypertension 2008  . MI (myocardial infarction) ([place])   . Non-compliance   . Schizoaffective disorder ([place]) 2006   . Substance abuse [facility])     Past Surgical History:  Procedure Laterality Date  . CYST EXCISION     Family History:  Family History  Problem Relation Age of Onset  . Heart disease Mother   . Heart disease Father   . Heart disease Maternal Grandmother   . Diabetes Maternal Grandmother    Family Psychiatric  History: "I don't know if there any  mental health issues in the family"  Tobacco Screening: Have you used any form of tobacco in the last 30 days? (Cigarettes, Smokeless Tobacco, Cigars, and/or Pipes): Yes Tobacco use, Select all that apply: 5 or more cigarettes per day Are you interested in Tobacco Cessation Medications?: Yes, will notify MD for an order Counseled patient on smoking cessation including recognizing danger situations, developing coping skills and basic information about quitting provided: Refused/Declined practical counseling  Social History:  Social History   Substance and Sexual Activity  Alcohol Use No  . Alcohol/week: 0.0 oz   Comment: quit     Social History   Substance and Sexual Activity  Drug Use Yes  . Types: Cocaine   Comment: 08/24/17    Additional Social History:  Allergies:   Allergies  Allergen Reactions  . Quetiapine Anaphylaxis  . Seroquel [Quetiapine Fumarate] Anxiety and Other (See Comments)    Reaction:  Nightmares   . Trazodone Anaphylaxis  .  Trazodone And Nefazodone Anxiety and Other (See Comments)    Other reaction(s): Unknown Nightmares Reaction:  Nightmares   . Haldol [Haloperidol Lactate] Anxiety and Other (See Comments)    Reaction:  Nightmares    Lab Results:  Results for orders placed or performed during the hospital encounter of 10/12/17 (from the past 48 hour(s))  Glucose, capillary     Status: Abnormal   Collection Time: 10/12/17  4:49 PM  Result Value Ref Range   Glucose-Capillary 210 (H) 65 - 99 mg/dL  Hemoglobin A1c     Status: Abnormal   Collection Time: 10/12/17  6:18 PM  Result Value Ref Range   Hgb A1c MFr Bld 8.1 (H) 4.8 - 5.6 %    Comment: (NOTE) Pre diabetes:          5.7%-6.4% Diabetes:              >6.4% Glycemic control for   <7.0% adults with diabetes    Mean Plasma Glucose 185.77 mg/dL    Comment: Performed at [facility], [place] [address], [place], [place] [address]  Glucose, capillary     Status: Abnormal   Collection Time:  10/12/17  8:37 PM  Result Value Ref Range   Glucose-Capillary 229 (H) 65 - 99 mg/dL   Comment 1 Notify RN   Glucose, capillary     Status: Abnormal   Collection Time: 10/13/17  5:51 AM  Result Value Ref Range   Glucose-Capillary 217 (H) 65 - 99 mg/dL   Comment 1 Notify RN    Blood Alcohol level:  Lab Results  Component Value Date   ETH <10 10/11/2017   ETH <5 29/79/8921   Metabolic Disorder Labs:  Lab Results  Component Value Date   HGBA1C 8.1 (H) 10/12/2017   MPG 185.77 10/12/2017   MPG 148 07/29/2015   Lab Results  Component Value Date   PROLACTIN 70.0 (H) 12/11/2015   Lab Results  Component Value Date   CHOL 184 11/11/2016   TRIG 204 (H) 11/11/2016   HDL 25 (L) 11/11/2016   CHOLHDL 7.4 (H) 11/11/2016   VLDL 43 (H) 12/11/2015   LDLCALC 118 (H) 11/11/2016   LDLCALC 88 12/11/2015   Current Medications: Current Facility-Administered Medications  Medication Dose Route Frequency Provider Last Rate Last Dose  . acetaminophen (TYLENOL) tablet 650 mg  650 mg Oral Q6H PRN Benjamine Mola, FNP   650 mg at 10/13/17 1941  . albuterol (PROVENTIL HFA;VENTOLIN HFA) 108 (90 Base) MCG/ACT inhaler 2 puff  2 puff Inhalation Q4H PRN Withrow, Elyse Jarvis, FNP      . aspirin EC tablet 81 mg  81 mg Oral Daily Benjamine Mola, FNP   81 mg at 10/13/17 7408  . atorvastatin (LIPITOR) tablet 40 mg  40 mg Oral q1800 Benjamine Mola, FNP   40 mg at 10/12/17 1656  . clonazePAM (KLONOPIN) tablet 0.5 mg  0.5 mg Oral TID PRN Benjamine Mola, FNP   0.5 mg at 10/13/17 0811  . gabapentin (NEURONTIN) capsule 300 mg  300 mg Oral TID Lindell Spar I, NP      . insulin aspart (novoLOG) injection 0-15 Units  0-15 Units Subcutaneous TID WC Cobos, Myer Peer, MD   5 Units at 10/13/17 [phone]  . insulin NPH Human (HUMULIN N,NOVOLIN N) injection 10 Units  10 Units Subcutaneous BID AC & HS Nwoko, Agnes I, NP      . isosorbide dinitrate (ISORDIL) tablet 30 mg  30 mg Oral  Daily Benjamine Mola, FNP   30 mg at 10/13/17 4193   . lisinopril (PRINIVIL,ZESTRIL) tablet 20 mg  20 mg Oral Daily Benjamine Mola, FNP   20 mg at 10/13/17 7902  . metFORMIN (GLUCOPHAGE-XR) 24 hr tablet 500 mg  500 mg Oral BID Benjamine Mola, FNP   500 mg at 10/13/17 4097  . nicotine (NICODERM CQ - dosed in mg/24 hours) patch 21 mg  21 mg Transdermal Daily Cobos, Myer Peer, MD   21 mg at 10/13/17 3532  . ondansetron (ZOFRAN-ODT) disintegrating tablet 4 mg  4 mg Oral Q6H PRN Withrow, Elyse Jarvis, FNP      . pantoprazole (PROTONIX) EC tablet 40 mg  40 mg Oral Daily Benjamine Mola, FNP   40 mg at 10/13/17 9924  . tamsulosin (FLOMAX) capsule 0.4 mg  0.4 mg Oral Daily Benjamine Mola, FNP   0.4 mg at 10/13/17 2683  . zolpidem (AMBIEN) tablet 10 mg  10 mg Oral QHS PRN Benjamine Mola, FNP   10 mg at 10/12/17 2107   PTA Medications: Medications Prior to Admission  Medication Sig Dispense Refill Last Dose  . albuterol (PROVENTIL HFA;VENTOLIN HFA) 108 (90 Base) MCG/ACT inhaler Inhale 2 puffs into the lungs every 6 (six) hours as needed for wheezing or shortness of breath. (Patient taking differently: Inhale 2 puffs into the lungs every 4 (four) hours as needed for wheezing or shortness of breath. ) 1 Inhaler 11 Past Week at Unknown time  . aspirin EC 81 MG tablet Take 1 tablet (81 mg total) by mouth daily. Reported on 01/02/2016 30 tablet 11 Past Week at Unknown time  . atorvastatin (LIPITOR) 40 MG tablet TAKE 1 TABLET BY MOUTH EVERY DAY - EMERGENCY REFILL FAXED DR 30 tablet 4 Past Week at Unknown time  . clonazePAM (KLONOPIN) 0.5 MG tablet Take 0.5 mg by mouth 3 (three) times daily.  0 Past Week at Unknown time  . cyclobenzaprine (FLEXERIL) 5 MG tablet TAKE ONE TABLET BY MOUTH EVERY 8 HOURS AS NEEDED FOR MUSCLE SPASMS  0 Past Week at Unknown time  . escitalopram (LEXAPRO) 20 MG tablet Take 20 mg by mouth daily.   Past Week at Unknown time  . Fluticasone-Salmeterol (ADVAIR) 100-50 MCG/DOSE AEPB Inhale 1 puff into the lungs 2 (two) times daily. (Patient taking  differently: Inhale 2 puffs into the lungs 2 (two) times daily. ) 1 each 5 Past Week at Unknown time  . gabapentin (NEURONTIN) 800 MG tablet Take 1 tablet (800 mg total) by mouth 5 (five) times daily. (Patient taking differently: Take 800 mg by mouth 4 (four) times daily. ) 150 tablet 0 Past Week at Unknown time  . hydrOXYzine (VISTARIL) 25 MG capsule Take 25 mg by mouth 3 (three) times daily as needed.   Past Week at Unknown time  . insulin aspart (NOVOLOG FLEXPEN) 100 UNIT/ML FlexPen Inject 10-15 Units into the skin as directed.    Past Week at Unknown time  . isosorbide dinitrate (ISORDIL) 30 MG tablet Take 30 mg by mouth daily.   Past Week at Unknown time  . lisinopril (PRINIVIL,ZESTRIL) 10 MG tablet Take 10 mg by mouth daily.   Past Week at Unknown time  . metoprolol tartrate (LOPRESSOR) 25 MG tablet Take 25 mg by mouth 2 (two) times daily.   Past Week at Unknown time  . paliperidone (INVEGA SUSTENNA) 234 MG/1.5ML SUSP injection Inject 234 mg into the muscle every 30 (thirty) days.   unknown  .  risperiDONE (RISPERDAL) 0.5 MG tablet Take 0.5 mg by mouth daily.   Past Week at Unknown time  . tamsulosin (FLOMAX) 0.4 MG CAPS capsule Take 0.4 mg by mouth daily.   unknown  . zolpidem (AMBIEN) 10 MG tablet Take 10 mg by mouth at bedtime as needed for sleep.   Past Week at Unknown time   Musculoskeletal: Strength & Muscle Tone: within normal limits Gait & Station: normal Patient leans: N/A  Psychiatric Specialty Exam: Physical Exam  Constitutional: He appears well-developed.  HENT:  Head: Normocephalic.  Eyes: Pupils are equal, round, and reactive to light.  Neck: Normal range of motion.  Cardiovascular: Normal rate.  Respiratory: Effort normal.  GI: Soft.  Genitourinary:  Genitourinary Comments: Deferred  Musculoskeletal: Normal range of motion.  Neurological: He is alert.  Skin: Skin is warm.    Review of Systems  Constitutional: Negative.   HENT: Negative.   Eyes: Negative.    Respiratory: Negative.   Cardiovascular: Negative.   Gastrointestinal: Negative.   Genitourinary: Negative.   Musculoskeletal: Negative.   Skin: Negative.   Neurological: Negative.   Endo/Heme/Allergies: Negative.   Psychiatric/Behavioral: Positive for depression and suicidal ideas. Negative for hallucinations, memory loss and substance abuse. The patient is nervous/anxious and has insomnia.     Blood pressure (!) 107/57, pulse 61, temperature 97.6 F (36.4 C), temperature source Oral, resp. rate 20, height 5' 9.5" (1.765 m), weight 99.1 kg (218 lb 8 oz).Body mass index is 31.8 kg/m.  General Appearance: Casual, obese  Eye Contact:  Fair  Speech:  Clear and Coherent and Normal Rate  Volume:  Normal  Mood:  "I'm not really depressed, I just got bad anxiety"  Affect:  Constricted  Thought Process:  Coherent and Descriptions of Associations: Intact  Orientation:  Full (Time, Place, and Person)  Thought Content:  Ruminations, denies any hallucinations, delusions or paranoia.  Suicidal Thoughts:  Denies any thoughts, plans or intent.  Homicidal Thoughts:  Denies  Memory:  Immediate;   Fair Recent;   Fair Remote;   Fair  Judgement:  Fair  Insight:  Fair  Psychomotor Activity:  Normal  Concentration:  Concentration: Good and Attention Span: Good  Recall:  [employer] of Knowledge:  Fair  Language:  Good  Akathisia:  Negative  Handed:  Right  AIMS (if indicated):     Assets:  Communication Skills Desire for Improvement  ADL's:  Intact  Cognition:  WNL  Sleep:  Number of Hours: 5.5   Treatment Plan Summary: Daily contact with patient to assess and evaluate symptoms and progress in treatment: See MAR, Md's SRA & treatment plan.  Observation Level/Precautions:  15 minute checks  Laboratory:  Per ED  Psychotherapy: group sessions    Medications: See [facility]  Consultations: As needed.    Discharge Concerns: Safety, mood stability.   Estimated LOS: 2-4 days  Other: Admit to the  [facility]Hall.    Physician Treatment Plan for Primary  Diagnosis: Schizoaffective disorder, depressive type ([place])  Long Term Goal(s): Improvement in symptoms so as ready for discharge  Short Term Goals: Ability to identify changes in lifestyle to reduce recurrence of condition will improve, Ability to verbalize feelings will improve and Ability to disclose and discuss suicidal ideas  Physician Treatment Plan for Secondary Diagnosis: Principal Problem:   Schizoaffective disorder, depressive type ([place]) Active Problems:   MDD (major depressive disorder), recurrent severe, without psychosis ([place])  Long Term Goal(s): Improvement in symptoms so as ready for discharge  Short Term  Goals: Ability to identify and develop effective coping behaviors will improve, Compliance with prescribed medications will improve and Ability to identify triggers associated with substance abuse/mental health issues will improve  I certify that inpatient services furnished can reasonably be expected to improve the patient's condition.   Lindell Spar, NP, PMHNP, FNP-BC. 12/27/20189:44 AM   I have reviewed NP's Note, assessement, diagnosis and plan, and agree. I have also met with patient and completed suicide risk assessment.  [name] is a 56 y/o M with history of MDD versus schizoaffective disorder depressive type who was admitted voluntarily from [facility] ED where he presented after calling 911 himself to report worsened depression and SI with plan to overdose or hang himself.   Upon interview, pt shares his reasons for coming to the hospital, stating, "I was suicidal and I'm homeless." Pt shares he has also been struggling with stressor of death of his mother in 01-20-17. He initially denies other symptoms of depression and states that he is "just suicidal, and I wouldn't be suicidal if I wasn't homeless." Pt denies sleep disturbance, anhedonia, guilty feelings, or appetite changes. He denies symptoms of mania,  OCD, and PTSD. He denies recent illicit substance use.  Discussed with patient about treatment options. He notes that he follows with [facility] in [place] and his current medications include Invega Sustenna 231m IM q30 days, klonopin 12mTID, and gabapentin 80059mive times per day. Of note, pt had UDS negative for benzodiazepines on arrival and several weeks ago as well. Pt had last confirmed sustenna injection on December 5th. Discussed with patient that we will restart InvKirt Boysd administer next injection slightly early from next scheduled date. We will also restart clonazepam and gabapentin at lower doses. Social work team will look into referrals to shelters/group homes at this time as per pt's request. Pt had no further questions, comments, or concerns.   PLAN OF CARE:  - admit to inpatient psychiatry unit  - Major depressive disorder versus schizoaffective disorder depressive type by history             - Continue Invega Sustenna 234m73m q30 days (last given 09/21/17, we will schedule next injection for 10/14/17)  - Anxiety             - Change gabapentin 800mg57me times per day to gabapentin 400mg 73m            - Change klonopin 1mg TI69mo klonopin 1mg BID55mn anxiety  - Discharge planning will be ongoing - SW team to look into placement/group home options - Encourage participation in groups and therapeutic milieu   ChristopMaris [name]

## 2017-10-14 DIAGNOSIS — R451 Restlessness and agitation: Secondary | ICD-10-CM

## 2017-10-14 DIAGNOSIS — F1410 Cocaine abuse, uncomplicated: Secondary | ICD-10-CM

## 2017-10-14 DIAGNOSIS — F39 Unspecified mood [affective] disorder: Secondary | ICD-10-CM

## 2017-10-14 LAB — GLUCOSE, CAPILLARY
GLUCOSE-CAPILLARY: 189 mg/dL — AB (ref 65–99)
Glucose-Capillary: 184 mg/dL — ABNORMAL HIGH (ref 65–99)
Glucose-Capillary: 212 mg/dL — ABNORMAL HIGH (ref 65–99)
Glucose-Capillary: 217 mg/dL — ABNORMAL HIGH (ref 65–99)

## 2017-10-14 MED ORDER — TUBERCULIN PPD 5 UNIT/0.1ML ID SOLN
5.0000 [IU] | Freq: Once | INTRADERMAL | Status: AC
  Administered 2017-10-16: 5 [IU] via INTRADERMAL

## 2017-10-14 NOTE — Progress Notes (Signed)
Recreation Therapy Notes  Date: 10/14/17 Time: 0930 Location: [facility] Dayroom  Group Topic: Stress Management  Goal Area(s) Addresses:  Patient will verbalize importance of using healthy stress management.  Patient will identify positive emotions associated with healthy stress management.   Intervention: Stress Management  Activity :  Body Scan Meditation.  LRT introduced the stress management technique of meditation.  LRT played a meditation from the Calm app that guided them through a body scan to allow them to become aware of any sensations, tensions or uneasy feelings they may be experiencing.  Education:  Stress Management, Discharge Planning.   Education Outcome: Acknowledges edcuation/In group clarification offered/Needs additional education  Clinical Observations/Feedback: Pt did not attend group.    Victorino Sparrow, LRT/CTRS         Ria Comment, Mariane Burpee A 10/14/2017 10:59 AM

## 2017-10-14 NOTE — Progress Notes (Signed)
D: Pt presents with a flat affect and depressed mood. Pt noted to be easily agitated, demanding and verbally aggressive when he doesn't get his way. Pt appears med seeking and is observed repeatedly asking for meds. Pt verbalized feeling paranoid today like someone is trying to do something to him. Pt denies SI/HI. Pt denies AVH. Pt reports difficulty sleeping last night but was unable to elaborate on why he didn't sleep well. A: Medications reviewed with pt. Medications administered as ordered per MD. Verbal support provided. Pt encouraged to attend groups. 15 minute checks formed for safety.  R: Pt compliant with tx.
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
## 2017-10-14 NOTE — Progress Notes (Signed)
Inpatient Diabetes Program Recommendations  AACE/ADA: New Consensus Statement on Inpatient Glycemic Control (2015)  Target Ranges:  Prepandial:   less than 140 mg/dL      Peak postprandial:   less than 180 mg/dL (1-2 hours)      Critically ill patients:  140 - 180 mg/dL   Results for KELON, EASOM (MRN [id number]) as of 10/14/2017 15:03  Ref. Range 10/13/2017 05:51 10/13/2017 11:46 10/13/2017 16:43 10/13/2017 21:37  Glucose-Capillary Latest Ref Range: 65 - 99 mg/dL 217 (H) 192 (H) 197 (H) 243 (H)   Results for JUSTAN, GAEDE (MRN [id number]) as of 10/14/2017 15:03  Ref. Range 10/14/2017 06:23 10/14/2017 11:58  Glucose-Capillary Latest Ref Range: 65 - 99 mg/dL 184 (H) 212 (H)     Home DM Meds: Metformin 500 mg BID                             NPH Insulin 20 units AM/ 14 units QHS  (listed in MD notes from ED)                             Novolog Use as directed (listed in MD notes from ED)  Current Insulin Orders: Metformin 500 mg BID                                       Novolog Moderate Correction Scale/ SSI (0-15 units) TID AC    NPH Insulin 10 units BID            MD- Please consider increasing NPH slightly to 12 units BID  Per ED records from previous visits, patient has NPH insulin listed as outpatient medication.     --Will follow patient during hospitalization--  Wyn Quaker RN, MSN, CDE Diabetes Coordinator Inpatient Glycemic Control Team Team Pager: [phone] (8a-5p)'

## 2017-10-14 NOTE — Progress Notes (Signed)
[facility] MD Progress Note  10/14/2017 3:08 PM [name]  MRN:  [id number]  Subjective: [name] reports, "I'm still not depressed. I got shingles. Can I have Valtrex. I had taken it at two different times already. The Urgent care gave it to me. I'm anxious about getting out of this hospital. Do you know when I will be leaving. Can you increase my Klonopin".  Objective: [name] is a 56 y/o M with history of MDD versus schizoaffective disorder depressive type who was admitted voluntarily from [facility] ED where he presented after calling 911 himself to report worsened depression and SI with plan to overdose or hang himself. Upon interview, pt shares his reasons for coming to the hospital, stating, "I was suicidal and I'm homeless." Pt shares he has also been struggling with stressor of death of his mother in 01/31/2017. He initially denies other symptoms of depression and states that he is "just suicidal, and I wouldn't be suicidal if I wasn't homeless." Pt denies sleep disturbance, anhedonia, guilty feelings, or appetite changes. He denies symptoms of mania, OCD, and PTSD. Today, 10-14-17, Patient is seen, chart reviewed. Chart findings discussed with the treatment team. He is alert, oriented & aware of situation. He is visible on unit, participating in the group sessions. He continues to deny symptoms of depression. Continue to endorse high anxiety levels. Has the the tendency to seek medications. Seems to be savvy about mental health care. He is hoping to get into a group home kind of setting after discharge. Social worker is working on discharge disposition. Patient is taking & tolerating his treatment regimen. No adverse effects. Denies any SIHI, AVH, delusional thoughts or paranoia. Does not appear to be responding to any internal stimuli.     Principal Problem: Schizoaffective disorder, depressive type ([place])  Diagnosis:   Patient Active Problem List   Diagnosis Date Noted  . Schizoaffective  disorder, depressive type ([place]) [F25.1] 10/14/2014    Priority: High  . MDD (major depressive disorder), recurrent severe, without psychosis ([place]) [F33.2] 10/12/2017  . Diabetic neuropathy ([place]) [E11.40] 11/11/2016  . Anxiety and depression [F41.9, F32.9] 11/11/2016  . Chronic insomnia [F51.04] 01/08/2016  . Alcohol use disorder [IMO0002] 12/10/2015  . Benzodiazepine abuse ([place]) [F13.10] 12/10/2015  . Opioid abuse ([place]) [F11.10] 12/10/2015  . Constipation [K59.00] 02/06/2015  . BPH (benign prostatic hyperplasia) [N40.0] 01/16/2015  . Vitamin D deficiency [E55.9] 01/16/2015  . Allergic rhinitis [J30.9] 11/19/2014  . Chronic low back pain [M54.5, G89.29] 11/07/2014  . Onychomycosis of toenail [B35.1] 11/07/2014  . Tinea pedis [B35.3] 11/07/2014  . Psoriasis [L40.9] 10/14/2014  . HTN (hypertension) [I10] 10/14/2014  . Diabetes mellitus type 2, controlled ([place]) [E11.9] 10/14/2014  . COPD (chronic obstructive pulmonary disease) ([place]) [J44.9] 10/14/2014  . Tobacco abuse [Z72.0] 10/14/2014  . HLD (hyperlipidemia) [E78.5] 10/14/2014   Total Time spent with patient: 25 minutes  Past Psychiatric History: See H&P  Past Medical History:  Past Medical History:  Diagnosis Date  . Alcohol dependence ([place]) 1979/01/31   stated abusing ETOH at age 34   . Back pain   . Benzodiazepine dependence ([place])   . Cardiac arrest ([place])   . Chronic back pain   . Cocaine abuse ([place])   . COPD (chronic obstructive pulmonary disease) ([place])   . Diabetes mellitus without complication ([place]) 1497  . Hypertension 01-Feb-2007  . MI (myocardial infarction) ([place])   . Non-compliance   . Schizoaffective disorder ([place]) 31-Jan-2005   . Substance abuse ([place])  Past Surgical History:  Procedure Laterality Date  . CYST EXCISION     Family History:  Family History  Problem Relation Age of Onset  . Heart disease Mother   . Heart disease Father   . Heart disease Maternal Grandmother   . Diabetes Maternal Grandmother    Family  Psychiatric  History: See H&P Social History:  Social History   Substance and Sexual Activity  Alcohol Use No  . Alcohol/week: 0.0 oz   Comment: quit     Social History   Substance and Sexual Activity  Drug Use Yes  . Types: Cocaine   Comment: 08/24/17    Social History   Socioeconomic History  . Marital status: Single    Spouse name: None  . Number of children: None  . Years of education: None  . Highest education level: None  Social Needs  . Financial resource strain: None  . Food insecurity - worry: None  . Food insecurity - inability: None  . Transportation needs - medical: None  . Transportation needs - non-medical: None  Occupational History  . None  Tobacco Use  . Smoking status: Current Every Day Smoker    Packs/day: 1.50    Types: Cigarettes  . Smokeless tobacco: Never Used  Substance and Sexual Activity  . Alcohol use: No    Alcohol/week: 0.0 oz    Comment: quit  . Drug use: Yes    Types: Cocaine    Comment: 08/24/17  . Sexual activity: No  Other Topics Concern  . None  Social History Narrative  . None   Additional Social History:   Sleep: Fair  Appetite:  Good  Current Medications: Current Facility-Administered Medications  Medication Dose Route Frequency Provider Last Rate Last Dose  . acetaminophen (TYLENOL) tablet 650 mg  650 mg Oral Q6H PRN Benjamine Mola, FNP   650 mg at 10/13/17 1304  . albuterol (PROVENTIL HFA;VENTOLIN HFA) 108 (90 Base) MCG/ACT inhaler 2 puff  2 puff Inhalation Q4H PRN Withrow, Elyse Jarvis, FNP      . aspirin EC tablet 81 mg  81 mg Oral Daily Benjamine Mola, FNP   81 mg at 10/14/17 1027  . atorvastatin (LIPITOR) tablet 40 mg  40 mg Oral q1800 Benjamine Mola, FNP   40 mg at 10/13/17 1643  . clonazePAM (KLONOPIN) tablet 1 mg  1 mg Oral BID Pennelope Bracken, MD   1 mg at 10/14/17 2536  . gabapentin (NEURONTIN) capsule 400 mg  400 mg Oral TID Maris Berger T, MD   400 mg at 10/14/17 1207  . ibuprofen  (ADVIL,MOTRIN) tablet 800 mg  800 mg Oral Q6H PRN Lindell Spar I, NP   800 mg at 10/14/17 1207  . insulin aspart (novoLOG) injection 0-15 Units  0-15 Units Subcutaneous TID WC Cobos, Myer Peer, MD   5 Units at 10/14/17 1207  . insulin NPH Human (HUMULIN N,NOVOLIN N) injection 10 Units  10 Units Subcutaneous BID AC & HS Lindell Spar I, NP   10 Units at 10/14/17 0804  . isosorbide dinitrate (ISORDIL) tablet 30 mg  30 mg Oral Daily Benjamine Mola, FNP   30 mg at 10/14/17 6440  . lisinopril (PRINIVIL,ZESTRIL) tablet 20 mg  20 mg Oral Daily Benjamine Mola, FNP   20 mg at 10/14/17 3474  . metFORMIN (GLUCOPHAGE-XR) 24 hr tablet 500 mg  500 mg Oral BID Benjamine Mola, FNP   500 mg at 10/14/17 2595  . methocarbamol (ROBAXIN) tablet  500 mg  500 mg Oral Q8H PRN Cobos, Myer Peer, MD   500 mg at 10/14/17 1206  . nicotine (NICODERM CQ - dosed in mg/24 hours) patch 21 mg  21 mg Transdermal Daily Cobos, Myer Peer, MD   21 mg at 10/14/17 0803  . ondansetron (ZOFRAN-ODT) disintegrating tablet 4 mg  4 mg Oral Q6H PRN Withrow, John C, FNP      . paliperidone (INVEGA SUSTENNA) injection 234 mg  234 mg Intramuscular Q30 days Pennelope Bracken, MD   234 mg at 10/14/17 1340  . pantoprazole (PROTONIX) EC tablet 40 mg  40 mg Oral Daily Benjamine Mola, FNP   40 mg at 10/14/17 3295  . tamsulosin (FLOMAX) capsule 0.4 mg  0.4 mg Oral Daily Withrow, Elyse Jarvis, FNP   0.4 mg at 10/14/17 1884  . zolpidem (AMBIEN) tablet 10 mg  10 mg Oral QHS PRN Benjamine Mola, FNP   10 mg at 10/13/17 2127   Lab Results:  Results for orders placed or performed during the hospital encounter of 10/12/17 (from the past 48 hour(s))  Glucose, capillary     Status: Abnormal   Collection Time: 10/12/17  4:49 PM  Result Value Ref Range   Glucose-Capillary 210 (H) 65 - 99 mg/dL  Hemoglobin A1c     Status: Abnormal   Collection Time: 10/12/17  6:18 PM  Result Value Ref Range   Hgb A1c MFr Bld 8.1 (H) 4.8 - 5.6 %    Comment: (NOTE) Pre  diabetes:          5.7%-6.4% Diabetes:              >6.4% Glycemic control for   <7.0% adults with diabetes    Mean Plasma Glucose 185.77 mg/dL    Comment: Performed at [facility], [place] [address], [place], [place] [address]  Glucose, capillary     Status: Abnormal   Collection Time: 10/12/17  8:37 PM  Result Value Ref Range   Glucose-Capillary 229 (H) 65 - 99 mg/dL   Comment 1 Notify RN   Glucose, capillary     Status: Abnormal   Collection Time: 10/13/17  5:51 AM  Result Value Ref Range   Glucose-Capillary 217 (H) 65 - 99 mg/dL   Comment 1 Notify RN   Glucose, capillary     Status: Abnormal   Collection Time: 10/13/17 11:46 AM  Result Value Ref Range   Glucose-Capillary 192 (H) 65 - 99 mg/dL   Comment 1 Notify RN    Comment 2 Document in Chart   Glucose, capillary     Status: Abnormal   Collection Time: 10/13/17  4:43 PM  Result Value Ref Range   Glucose-Capillary 197 (H) 65 - 99 mg/dL   Comment 1 Notify RN    Comment 2 Document in Chart   Glucose, capillary     Status: Abnormal   Collection Time: 10/13/17  9:37 PM  Result Value Ref Range   Glucose-Capillary 243 (H) 65 - 99 mg/dL  Glucose, capillary     Status: Abnormal   Collection Time: 10/14/17  6:23 AM  Result Value Ref Range   Glucose-Capillary 184 (H) 65 - 99 mg/dL  Glucose, capillary     Status: Abnormal   Collection Time: 10/14/17 11:58 AM  Result Value Ref Range   Glucose-Capillary 212 (H) 65 - 99 mg/dL   Comment 1 Notify RN    Comment 2 Document in Chart    Blood Alcohol level:  Lab Results  Component Value Date   ETH <10 10/11/2017   ETH <5 53/61/4431   Metabolic Disorder Labs: Lab Results  Component Value Date   HGBA1C 8.1 (H) 10/12/2017   MPG 185.77 10/12/2017   MPG 148 07/29/2015   Lab Results  Component Value Date   PROLACTIN 70.0 (H) 12/11/2015   Lab Results  Component Value Date   CHOL 184 11/11/2016   TRIG 204 (H) 11/11/2016   HDL 25 (L) 11/11/2016   CHOLHDL 7.4 (H)  11/11/2016   VLDL 43 (H) 12/11/2015   LDLCALC 118 (H) 11/11/2016   LDLCALC 88 12/11/2015   Physical Findings: AIMS: Facial and Oral Movements Muscles of Facial Expression: None, normal Lips and Perioral Area: None, normal Jaw: None, normal Tongue: None, normal,Extremity Movements Upper (arms, wrists, hands, fingers): None, normal Lower (legs, knees, ankles, toes): None, normal, Trunk Movements Neck, shoulders, hips: None, normal, Overall Severity Severity of abnormal movements (highest score from questions above): None, normal Incapacitation due to abnormal movements: None, normal Patient's awareness of abnormal movements (rate only patient's report): No Awareness, Dental Status Current problems with teeth and/or dentures?: Yes Does patient usually wear dentures?: No  CIWA:    COWS:     Musculoskeletal: Strength & Muscle Tone: within normal limits Gait & Station: normal Patient leans: N/A  Psychiatric Specialty Exam: Physical Exam  Nursing note and vitals reviewed.   Review of Systems  Psychiatric/Behavioral: Negative for depression, hallucinations, memory loss, substance abuse and suicidal ideas. The patient is nervous/anxious ("Improving") and has insomnia ("Improving").     Blood pressure 109/75, pulse 67, temperature 98.1 F (36.7 C), temperature source Oral, resp. rate 18, height 5' 9.5" (1.765 m), weight 99.1 kg (218 lb 8 oz).Body mass index is 31.8 kg/m.  General Appearance: Casual  Eye Contact:  Fair  Speech:  Clear and Coherent and Normal Rate  Volume:  Normal  Mood:  Anxious and Irritable  Affect:  Blunt and Labile  Thought Process:  Coherent and Goal Directed  Orientation:  Full (Time, Place, and Person)  Thought Content:  Logical  Suicidal Thoughts:  Yes.  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 We working on the discharge disposition.  Continue inpatient hospitalization.  Will continue today 10/14/2017 plan as below except where it is noted.  Anxiety.    - Continue Klonopin 1 mg po bid.  Agitation/diabetix Neuropathy.    - Continue Gabapentin po 400 mg tid.   Mood control.    - Continue Invega Sustenna 234 mg IM Q 30 days.   Insomnia.    - Continue Ambien 10 mg po Q hs.  TB skin test on 10-16-17 for group home placement.  Other medical issues.    - Continue current medication regimen as recommended. Encourage group participation. SW to continue to work on the discharge disposition.  Herbert Pun  Alston Berrie, NP, PMHNP, FNP-BC 10/14/2017, 3:08 PM

## 2017-10-14 NOTE — Tx Team (Signed)
Interdisciplinary Treatment and Diagnostic Plan Update  10/14/2017 Time of Session: 0830AM [name] MRN: [id number]  Principal Diagnosis: Schizoaffective disorder, depressive type [facility])  Secondary Diagnoses: Principal Problem:   Schizoaffective disorder, depressive type ([place]) Active Problems:   MDD (major depressive disorder), recurrent severe, without psychosis ([place])   Current Medications:  Current Facility-Administered Medications  Medication Dose Route Frequency Provider Last Rate Last Dose  . acetaminophen (TYLENOL) tablet 650 mg  650 mg Oral Q6H PRN Benjamine Mola, FNP   650 mg at 10/13/17 1304  . albuterol (PROVENTIL HFA;VENTOLIN HFA) 108 (90 Base) MCG/ACT inhaler 2 puff  2 puff Inhalation Q4H PRN Withrow, Elyse Jarvis, FNP      . aspirin EC tablet 81 mg  81 mg Oral Daily Benjamine Mola, FNP   81 mg at 10/14/17 1443  . atorvastatin (LIPITOR) tablet 40 mg  40 mg Oral q1800 Benjamine Mola, FNP   40 mg at 10/13/17 1643  . clonazePAM (KLONOPIN) tablet 1 mg  1 mg Oral BID Pennelope Bracken, MD   1 mg at 10/14/17 1540  . gabapentin (NEURONTIN) capsule 400 mg  400 mg Oral TID Pennelope Bracken, MD   400 mg at 10/14/17 0803  . ibuprofen (ADVIL,MOTRIN) tablet 800 mg  800 mg Oral Q6H PRN Lindell Spar I, NP   800 mg at 10/14/17 0409  . insulin aspart (novoLOG) injection 0-15 Units  0-15 Units Subcutaneous TID WC Cobos, Myer Peer, MD   3 Units at 10/13/17 1730  . insulin NPH Human (HUMULIN N,NOVOLIN N) injection 10 Units  10 Units Subcutaneous BID AC & HS Lindell Spar I, NP   10 Units at 10/14/17 0804  . isosorbide dinitrate (ISORDIL) tablet 30 mg  30 mg Oral Daily Benjamine Mola, FNP   30 mg at 10/14/17 0867  . lisinopril (PRINIVIL,ZESTRIL) tablet 20 mg  20 mg Oral Daily Benjamine Mola, FNP   20 mg at 10/14/17 6195  . metFORMIN (GLUCOPHAGE-XR) 24 hr tablet 500 mg  500 mg Oral BID Benjamine Mola, FNP   500 mg at 10/14/17 0932  . methocarbamol (ROBAXIN) tablet 500 mg  500 mg  Oral Q8H PRN Cobos, Myer Peer, MD   500 mg at 10/14/17 0409  . nicotine (NICODERM CQ - dosed in mg/24 hours) patch 21 mg  21 mg Transdermal Daily Cobos, Myer Peer, MD   21 mg at 10/14/17 0803  . ondansetron (ZOFRAN-ODT) disintegrating tablet 4 mg  4 mg Oral Q6H PRN Withrow, John C, FNP      . paliperidone (INVEGA SUSTENNA) injection 234 mg  234 mg Intramuscular Q30 days Maris Berger T, MD      . pantoprazole (PROTONIX) EC tablet 40 mg  40 mg Oral Daily Benjamine Mola, FNP   40 mg at 10/14/17 6712  . tamsulosin (FLOMAX) capsule 0.4 mg  0.4 mg Oral Daily Withrow, Elyse Jarvis, FNP   0.4 mg at 10/14/17 4580  . zolpidem (AMBIEN) tablet 10 mg  10 mg Oral QHS PRN Benjamine Mola, FNP   10 mg at 10/13/17 2127   PTA Medications: Medications Prior to Admission  Medication Sig Dispense Refill Last Dose  . albuterol (PROVENTIL HFA;VENTOLIN HFA) 108 (90 Base) MCG/ACT inhaler Inhale 2 puffs into the lungs every 6 (six) hours as needed for wheezing or shortness of breath. (Patient taking differently: Inhale 2 puffs into the lungs every 4 (four) hours as needed for wheezing or shortness of breath. ) 1 Inhaler 11  Past Week at Unknown time  . aspirin EC 81 MG tablet Take 1 tablet (81 mg total) by mouth daily. Reported on 01/02/2016 30 tablet 11 Past Week at Unknown time  . atorvastatin (LIPITOR) 40 MG tablet TAKE 1 TABLET BY MOUTH EVERY DAY - EMERGENCY REFILL FAXED DR 30 tablet 4 Past Week at Unknown time  . clonazePAM (KLONOPIN) 0.5 MG tablet Take 0.5 mg by mouth 3 (three) times daily.  0 Past Week at Unknown time  . cyclobenzaprine (FLEXERIL) 5 MG tablet TAKE ONE TABLET BY MOUTH EVERY 8 HOURS AS NEEDED FOR MUSCLE SPASMS  0 Past Week at Unknown time  . escitalopram (LEXAPRO) 20 MG tablet Take 20 mg by mouth daily.   Past Week at Unknown time  . Fluticasone-Salmeterol (ADVAIR) 100-50 MCG/DOSE AEPB Inhale 1 puff into the lungs 2 (two) times daily. (Patient taking differently: Inhale 2 puffs into the lungs 2  (two) times daily. ) 1 each 5 Past Week at Unknown time  . gabapentin (NEURONTIN) 800 MG tablet Take 1 tablet (800 mg total) by mouth 5 (five) times daily. (Patient taking differently: Take 800 mg by mouth 4 (four) times daily. ) 150 tablet 0 Past Week at Unknown time  . hydrOXYzine (VISTARIL) 25 MG capsule Take 25 mg by mouth 3 (three) times daily as needed.   Past Week at Unknown time  . insulin aspart (NOVOLOG FLEXPEN) 100 UNIT/ML FlexPen Inject 10-15 Units into the skin as directed.    Past Week at Unknown time  . isosorbide dinitrate (ISORDIL) 30 MG tablet Take 30 mg by mouth daily.   Past Week at Unknown time  . lisinopril (PRINIVIL,ZESTRIL) 10 MG tablet Take 10 mg by mouth daily.   Past Week at Unknown time  . metoprolol tartrate (LOPRESSOR) 25 MG tablet Take 25 mg by mouth 2 (two) times daily.   Past Week at Unknown time  . paliperidone (INVEGA SUSTENNA) 234 MG/1.5ML SUSP injection Inject 234 mg into the muscle every 30 (thirty) days.   unknown  . risperiDONE (RISPERDAL) 0.5 MG tablet Take 0.5 mg by mouth daily.   Past Week at Unknown time  . tamsulosin (FLOMAX) 0.4 MG CAPS capsule Take 0.4 mg by mouth daily.   unknown  . zolpidem (AMBIEN) 10 MG tablet Take 10 mg by mouth at bedtime as needed for sleep.   Past Week at Unknown time    Patient Stressors: Financial difficulties Health problems Loss of mother  Patient Strengths: Ability for insight Average or above average intelligence General fund of knowledge Motivation for treatment/growth  Treatment Modalities: Medication Management, Group therapy, Case management,  1 to 1 session with clinician, Psychoeducation, Recreational therapy.   Physician Treatment Plan for Primary Diagnosis: Schizoaffective disorder, depressive type ([place]) Long Term Goal(s): Improvement in symptoms so as ready for discharge Improvement in symptoms so as ready for discharge   Short Term Goals: Ability to identify changes in lifestyle to reduce recurrence  of condition will improve Ability to verbalize feelings will improve Ability to disclose and discuss suicidal ideas Ability to identify and develop effective coping behaviors will improve Compliance with prescribed medications will improve Ability to identify triggers associated with substance abuse/mental health issues will improve  Medication Management: Evaluate patient's response, side effects, and tolerance of medication regimen.  Therapeutic Interventions: 1 to 1 sessions, Unit Group sessions and Medication administration.  Evaluation of Outcomes: Not Met  Physician Treatment Plan for Secondary Diagnosis: Principal Problem:   Schizoaffective disorder, depressive type ([place]) Active Problems:  MDD (major depressive disorder), recurrent severe, without psychosis ([place])  Long Term Goal(s): Improvement in symptoms so as ready for discharge Improvement in symptoms so as ready for discharge   Short Term Goals: Ability to identify changes in lifestyle to reduce recurrence of condition will improve Ability to verbalize feelings will improve Ability to disclose and discuss suicidal ideas Ability to identify and develop effective coping behaviors will improve Compliance with prescribed medications will improve Ability to identify triggers associated with substance abuse/mental health issues will improve     Medication Management: Evaluate patient's response, side effects, and tolerance of medication regimen.  Therapeutic Interventions: 1 to 1 sessions, Unit Group sessions and Medication administration.  Evaluation of Outcomes: Not Met   RN Treatment Plan for Primary Diagnosis: Schizoaffective disorder, depressive type ([place]) Long Term Goal(s): Knowledge of disease and therapeutic regimen to maintain health will improve  Short Term Goals: Ability to verbalize frustration and anger appropriately will improve, Ability to disclose and discuss suicidal ideas and Ability to identify and  develop effective coping behaviors will improve  Medication Management: RN will administer medications as ordered by provider, will assess and evaluate patient's response and provide education to patient for prescribed medication. RN will report any adverse and/or side effects to prescribing provider.  Therapeutic Interventions: 1 on 1 counseling sessions, Psychoeducation, Medication administration, Evaluate responses to treatment, Monitor vital signs and CBGs as ordered, Perform/monitor CIWA, COWS, AIMS and Fall Risk screenings as ordered, Perform wound care treatments as ordered.  Evaluation of Outcomes: Not Met   LCSW Treatment Plan for Primary Diagnosis: Schizoaffective disorder, depressive type ([place]) Long Term Goal(s): Safe transition to appropriate next level of care at discharge, Engage patient in therapeutic group addressing interpersonal concerns.  Short Term Goals: Engage patient in aftercare planning with referrals and resources, Increase emotional regulation, Identify triggers associated with mental health/substance abuse issues and Increase skills for wellness and recovery  Therapeutic Interventions: Assess for all discharge needs, 1 to 1 time with Social worker, Explore available resources and support systems, Assess for adequacy in community support network, Educate family and significant other(s) on suicide prevention, Complete Psychosocial Assessment, Interpersonal group therapy.  Evaluation of Outcomes: Not Met   Progress in Treatment: Attending groups: No. Participating in groups: No. Taking medication as prescribed: Yes. Toleration medication: Yes. Family/Significant other contact made: SPE completed with pt; pt declined to consent to collateral contact.  Patient understands diagnosis: Yes. Discussing patient identified problems/goals with staff: Yes. Medical problems stabilized or resolved: Yes. Denies suicidal/homicidal ideation: Yes. Issues/concerns per patient  self-inventory: No. Other: n/a   New problem(s) identified: No, Describe:  n/a  New Short Term/Long Term Goal(s): medication management for mood stabilization; elimination of SI; development of comprehensive mental wellness/sobriety plan.   Discharge Plan or Barriers: CSW assessing for appropriate referrals. Pt states that he is homeless and is seeking group home placement. FL2 and RSVP completed on 10/13/17. Pt made aware that group home placement may not occur from hospital setting. Pt agitated and labile on unit. He is agreeable to continue going to [facility].   Reason for Continuation of Hospitalization: Aggression Anxiety Depression Medication stabilization Suicidal ideation Other; describe pain  Estimated Length of Stay: Tuesday, 10/17/17  Attendees: Patient: 10/14/2017 10:56 AM  Physician: Dr. Parke Poisson MD; Dr. Nancy Fetter MD 10/14/2017 10:56 AM  Nursing: Amanda Cockayne RN 10/14/2017 10:56 AM  RN Care Manager:x 10/14/2017 10:56 AM  Social Worker: Press photographer, LCSW 10/14/2017 10:56 AM  Recreational Therapist: x  10/14/2017 10:56 AM  Other: Lindell Spar NP; Darnelle Maffucci Money NP 10/14/2017 10:56 AM  Other:  10/14/2017 10:56 AM  Other: 10/14/2017 10:56 AM    Scribe for Treatment Team: [place], LCSW 10/14/2017 10:56 AM

## 2017-10-15 DIAGNOSIS — Z590 Homelessness: Secondary | ICD-10-CM

## 2017-10-15 LAB — GLUCOSE, CAPILLARY
GLUCOSE-CAPILLARY: 221 mg/dL — AB (ref 65–99)
GLUCOSE-CAPILLARY: 193 mg/dL — AB (ref 65–99)
Glucose-Capillary: 200 mg/dL — ABNORMAL HIGH (ref 65–99)
Glucose-Capillary: 194 mg/dL — ABNORMAL HIGH (ref 65–99)

## 2017-10-15 MED ORDER — MAGNESIUM HYDROXIDE 400 MG/5ML PO SUSP
30.0000 mL | Freq: Every day | ORAL | Status: DC | PRN
  Administered 2017-10-15 (×2): 30 mL via ORAL
  Filled 2017-10-15: qty 30

## 2017-10-15 MED ORDER — NICOTINE POLACRILEX 2 MG MT GUM
2.0000 mg | CHEWING_GUM | Freq: Once | OROMUCOSAL | Status: AC
  Administered 2017-10-15: 2 mg via ORAL
  Filled 2017-10-15: qty 1

## 2017-10-15 MED ORDER — NICOTINE POLACRILEX 2 MG MT GUM
CHEWING_GUM | OROMUCOSAL | Status: AC
  Filled 2017-10-15: qty 1

## 2017-10-15 NOTE — B[facility] Group Notes (Signed)
[place] LCSW Group Therapy  10/15/2017 10:00 AM  Type of Therapy:  Group Therapy  Participation Level:  Active  Participation Quality:  Appropriate and Attentive  Affect:  Appropriate  Cognitive:  Alert and Oriented  Insight:  Improving  Engagement in Therapy:  Improving  Modes of Intervention:  Discussion  Today's group was about developing appropriate supports in preparation for discharge. Discussed with patients the development of multiple types of supports that support patient upon discharge. The types of supports discussed in group are unconditional supports, accountable supports, encouraging supports, goal directed supports and someone that the patient can support. Patients in group were able to discuss the importance and relevance of each type of support and identify the areas in their support system in which they need additional support.   Patient identified that his mother was a good support in multiple areas. Patient also identified that he experienced support that was not helpful. Patient encouraged to use this experience in order to learn how he could support another person.   Christene Lye MSW, LCSW

## 2017-10-15 NOTE — Progress Notes (Signed)
[facility] MD Progress Note  10/15/2017 3:07 PM [name]  MRN:  [id number]   Subjective:  Patient reports " I am homeless you can't help me"  Objective: [name] is awake, alert. Seen resting on the unit. Patient present with irritable and agitation. Patient is refusing to answer question and is dismissive of staff. Per staff patient takes medication intermittently. Support, encouragement and reassurance was provided.    Principal Problem: Schizoaffective disorder, depressive type ([place]) Diagnosis:   Patient Active Problem List   Diagnosis Date Noted  . MDD (major depressive disorder), recurrent severe, without psychosis ([place]) [F33.2] 10/12/2017  . Diabetic neuropathy ([place]) [E11.40] 11/11/2016  . Anxiety and depression [F41.9, F32.9] 11/11/2016  . Chronic insomnia [F51.04] 01/08/2016  . Alcohol use disorder [IMO0002] 12/10/2015  . Benzodiazepine abuse ([place]) [F13.10] 12/10/2015  . Opioid abuse ([place]) [F11.10] 12/10/2015  . Constipation [K59.00] 02/06/2015  . BPH (benign prostatic hyperplasia) [N40.0] 01/16/2015  . Vitamin D deficiency [E55.9] 01/16/2015  . Allergic rhinitis [J30.9] 11/19/2014  . Chronic low back pain [M54.5, G89.29] 11/07/2014  . Onychomycosis of toenail [B35.1] 11/07/2014  . Tinea pedis [B35.3] 11/07/2014  . Psoriasis [L40.9] 10/14/2014  . HTN (hypertension) [I10] 10/14/2014  . Diabetes mellitus type 2, controlled ([place]) [E11.9] 10/14/2014  . COPD (chronic obstructive pulmonary disease) ([place]) [J44.9] 10/14/2014  . Tobacco abuse [Z72.0] 10/14/2014  . HLD (hyperlipidemia) [E78.5] 10/14/2014  . Schizoaffective disorder, depressive type ([place]) [F25.1] 10/14/2014   Total Time spent with patient: 15 minutes  Past Psychiatric History:   Past Medical History:  Past Medical History:  Diagnosis Date  . Alcohol dependence ([place]) 1979   stated abusing ETOH at age 26   . Back pain   . Benzodiazepine dependence ([place])   . Cardiac arrest ([place])   . Chronic back pain   .  Cocaine abuse ([place])   . COPD (chronic obstructive pulmonary disease) ([place])   . Diabetes mellitus without complication ([place]) 2505  . Hypertension 2008  . MI (myocardial infarction) ([place])   . Non-compliance   . Schizoaffective disorder ([place]) 2006   . Substance abuse [facility])     Past Surgical History:  Procedure Laterality Date  . CYST EXCISION     Family History:  Family History  Problem Relation Age of Onset  . Heart disease Mother   . Heart disease Father   . Heart disease Maternal Grandmother   . Diabetes Maternal Grandmother    Family Psychiatric  History:  Social History:  Social History   Substance and Sexual Activity  Alcohol Use No  . Alcohol/week: 0.0 oz   Comment: quit     Social History   Substance and Sexual Activity  Drug Use Yes  . Types: Cocaine   Comment: 08/24/17    Social History   Socioeconomic History  . Marital status: Single    Spouse name: None  . Number of children: None  . Years of education: None  . Highest education level: None  Social Needs  . Financial resource strain: None  . Food insecurity - worry: None  . Food insecurity - inability: None  . Transportation needs - medical: None  . Transportation needs - non-medical: None  Occupational History  . None  Tobacco Use  . Smoking status: Current Every Day Smoker    Packs/day: 1.50    Types: Cigarettes  . Smokeless tobacco: Never Used  Substance and Sexual Activity  . Alcohol use: No    Alcohol/week: 0.0 oz    Comment:  quit  . Drug use: Yes    Types: Cocaine    Comment: 08/24/17  . Sexual activity: No  Other Topics Concern  . None  Social History Narrative  . None   Additional Social History:                         Sleep: Fair  Appetite:  Fair  Current Medications: Current Facility-Administered Medications  Medication Dose Route Frequency Provider Last Rate Last Dose  . acetaminophen (TYLENOL) tablet 650 mg  650 mg Oral Q6H PRN Benjamine Mola, FNP   650  mg at 10/13/17 1304  . albuterol (PROVENTIL HFA;VENTOLIN HFA) 108 (90 Base) MCG/ACT inhaler 2 puff  2 puff Inhalation Q4H PRN Withrow, Elyse Jarvis, FNP      . aspirin EC tablet 81 mg  81 mg Oral Daily Benjamine Mola, FNP   81 mg at 10/15/17 0945  . atorvastatin (LIPITOR) tablet 40 mg  40 mg Oral q1800 Benjamine Mola, FNP   40 mg at 10/14/17 1700  . clonazePAM (KLONOPIN) tablet 1 mg  1 mg Oral BID Pennelope Bracken, MD   1 mg at 10/15/17 0946  . gabapentin (NEURONTIN) capsule 400 mg  400 mg Oral TID Pennelope Bracken, MD   400 mg at 10/15/17 1351  . ibuprofen (ADVIL,MOTRIN) tablet 800 mg  800 mg Oral Q6H PRN Lindell Spar I, NP   800 mg at 10/15/17 1352  . insulin aspart (novoLOG) injection 0-15 Units  0-15 Units Subcutaneous TID WC Elizeo Rodriques, Myer Peer, MD   3 Units at 10/15/17 [phone]  . insulin NPH Human (HUMULIN N,NOVOLIN N) injection 10 Units  10 Units Subcutaneous BID AC & HS Lindell Spar I, NP   10 Units at 10/14/17 2110  . isosorbide dinitrate (ISORDIL) tablet 30 mg  30 mg Oral Daily Benjamine Mola, FNP   30 mg at 10/14/17 7989  . lisinopril (PRINIVIL,ZESTRIL) tablet 20 mg  20 mg Oral Daily Benjamine Mola, FNP   20 mg at 10/15/17 0945  . magnesium hydroxide (MILK OF MAGNESIA) suspension 30 mL  30 mL Oral Daily PRN Lindon Romp A, NP   30 mL at 10/15/17 0634  . metFORMIN (GLUCOPHAGE-XR) 24 hr tablet 500 mg  500 mg Oral BID Benjamine Mola, FNP   500 mg at 10/15/17 0944  . methocarbamol (ROBAXIN) tablet 500 mg  500 mg Oral Q8H PRN Hobert Poplaski, Myer Peer, MD   500 mg at 10/15/17 0629  . nicotine (NICODERM CQ - dosed in mg/24 hours) patch 21 mg  21 mg Transdermal Daily Valjean Ruppel, Myer Peer, MD   21 mg at 10/15/17 0944  . ondansetron (ZOFRAN-ODT) disintegrating tablet 4 mg  4 mg Oral Q6H PRN Withrow, John C, FNP      . paliperidone (INVEGA SUSTENNA) injection 234 mg  234 mg Intramuscular Q30 days Pennelope Bracken, MD   234 mg at 10/14/17 1340  . pantoprazole (PROTONIX) EC tablet 40 mg  40 mg  Oral Daily Benjamine Mola, FNP   40 mg at 10/15/17 0944  . tamsulosin (FLOMAX) capsule 0.4 mg  0.4 mg Oral Daily Withrow, John C, FNP   0.4 mg at 10/15/17 0945  . [START ON 10/16/2017] tuberculin injection 5 Units  5 Units Intradermal Once Nwoko, Agnes I, NP      . zolpidem (AMBIEN) tablet 10 mg  10 mg Oral QHS PRN Withrow, Elyse Jarvis, FNP   10 mg at  10/14/17 2107    Lab Results:  Results for orders placed or performed during the hospital encounter of 10/12/17 (from the past 48 hour(s))  Glucose, capillary     Status: Abnormal   Collection Time: 10/13/17  4:43 PM  Result Value Ref Range   Glucose-Capillary 197 (H) 65 - 99 mg/dL   Comment 1 Notify RN    Comment 2 Document in Chart   Glucose, capillary     Status: Abnormal   Collection Time: 10/13/17  9:37 PM  Result Value Ref Range   Glucose-Capillary 243 (H) 65 - 99 mg/dL  Glucose, capillary     Status: Abnormal   Collection Time: 10/14/17  6:23 AM  Result Value Ref Range   Glucose-Capillary 184 (H) 65 - 99 mg/dL  Glucose, capillary     Status: Abnormal   Collection Time: 10/14/17 11:58 AM  Result Value Ref Range   Glucose-Capillary 212 (H) 65 - 99 mg/dL   Comment 1 Notify RN    Comment 2 Document in Chart   Glucose, capillary     Status: Abnormal   Collection Time: 10/14/17  5:11 PM  Result Value Ref Range   Glucose-Capillary 217 (H) 65 - 99 mg/dL  Glucose, capillary     Status: Abnormal   Collection Time: 10/14/17  8:37 PM  Result Value Ref Range   Glucose-Capillary 189 (H) 65 - 99 mg/dL   Comment 1 Notify RN   Glucose, capillary     Status: Abnormal   Collection Time: 10/15/17  6:04 AM  Result Value Ref Range   Glucose-Capillary 200 (H) 65 - 99 mg/dL  Glucose, capillary     Status: Abnormal   Collection Time: 10/15/17 11:50 AM  Result Value Ref Range   Glucose-Capillary 194 (H) 65 - 99 mg/dL    Blood Alcohol level:  Lab Results  Component Value Date   ETH <10 10/11/2017   ETH <5 37/16/9678    Metabolic Disorder  Labs: Lab Results  Component Value Date   HGBA1C 8.1 (H) 10/12/2017   MPG 185.77 10/12/2017   MPG 148 07/29/2015   Lab Results  Component Value Date   PROLACTIN 70.0 (H) 12/11/2015   Lab Results  Component Value Date   CHOL 184 11/11/2016   TRIG 204 (H) 11/11/2016   HDL 25 (L) 11/11/2016   CHOLHDL 7.4 (H) 11/11/2016   VLDL 43 (H) 12/11/2015   LDLCALC 118 (H) 11/11/2016   LDLCALC 88 12/11/2015    Physical Findings: AIMS: Facial and Oral Movements Muscles of Facial Expression: None, normal Lips and Perioral Area: None, normal Jaw: None, normal Tongue: None, normal,Extremity Movements Upper (arms, wrists, hands, fingers): None, normal Lower (legs, knees, ankles, toes): None, normal, Trunk Movements Neck, shoulders, hips: None, normal, Overall Severity Severity of abnormal movements (highest score from questions above): None, normal Incapacitation due to abnormal movements: None, normal Patient's awareness of abnormal movements (rate only patient's report): No Awareness, Dental Status Current problems with teeth and/or dentures?: Yes Does patient usually wear dentures?: No  CIWA:    COWS:     Musculoskeletal: Strength & Muscle Tone: within normal limits Gait & Station: normal Patient leans: N/A  Psychiatric Specialty Exam: Physical Exam  Neurological: He is alert.  Psychiatric: He has a normal mood and affect. His behavior is normal.    ROS  Blood pressure 100/79, pulse 92, temperature 98.5 F (36.9 C), temperature source Oral, resp. rate 16, height 5' 9.5" (1.765 m), weight 99.1 kg (218 lb 8 oz).Body  mass index is 31.8 kg/m.  General Appearance: Bizarre and Guarded  Eye Contact:  Fair  Speech:  Clear and Coherent  Volume:  Normal  Mood:  Anxious, Depressed and Irritable  Affect:  Flat and Labile  Thought Process:  Coherent  Orientation:  NA  Thought Content:  NA  Suicidal Thoughts:  UTA patient is refusing to answer question  Homicidal Thoughts:  UTA   Memory:  NA  Judgement:  NA  Insight:  Lacking  Psychomotor Activity:  Restlessness  Concentration:  Concentration: Poor  Recall:  Poor  Fund of Knowledge:  Poor  Language:  Fair  Akathisia:  No  Handed:    AIMS (if indicated):     Assets:  Communication Skills Resilience Social Support  ADL's:  Intact  Cognition:  WNL  Sleep:  Number of Hours: 6     Treatment Plan Summary: Daily contact with patient to assess and evaluate symptoms and progress in treatment and Medication management   Continue with current treatment plan 10/15/2017 except where noted  Anxiety.    - Continue Klonopin 1 mg po bid.  Agitation/diabetix Neuropathy    - Continue Gabapentin po 400 mg tid.   Mood stablization    - Continue Invega Sustenna 234 mg IM Q 30 days.   Insomnia.    - Continue Ambien 10 mg po Q hs.  TB skin test on 10-16-17 for group home placement.     - Continue current medication regimen as recommended. Encourage group participation. SW to continue to work on the discharge disposition.   Derrill [place], NP 10/15/2017, 3:07 PM   Agree with NP Progress Note

## 2017-10-15 NOTE — Progress Notes (Signed)
D Patient is seen on the adult unit, he is on the 300 hall, He says " just give me my pills...good god youre slow...just pour the pills out??" . He takes all of his pills. HE refuses to take his daily insulin . A He refuses to  complete and /or answer questions on  the patient assessment. He says " just take the damn paper and get outta here". R safety is in place .

## 2017-10-15 NOTE — Progress Notes (Signed)
Pt reports he had an "ok" day.  He denies SI/HI/AVH.  He says that he needs to get into a group home or a nursing home.  When writer questioned his choice of a nursing home, he corrected himself and said an ALF.  He says that he cannot return to his sister's home and he does not want to be out on the street.  His primary complaints are his chronic pain which is always, "oh, about a 9 or 10".  He spends a lot of his time up at the NS watching staff or trying to listen to conversation.  He does watch TV in the dayroom at times.  Support and encouragement offered.  Discharge plans are in process.  Safety maintained with q15 minute checks.

## 2017-10-15 NOTE — Progress Notes (Signed)
D.  Pt presents very concerned about discharge, pt is unsure where he will go or when.  Pt asked if doctor had written anything about this.  Pt was encouraged to speak to doctor tomorrow.  Pt complaint of constipation despite previously receiving MOM today.  Pt requested second dose.  Pt states that "it always works at home, I just drink out of the bottle as much as I need".  Pt did not attend evening AA group, requested medication as early as possible and then went to room.  Pt denies SI/HI/AVH at this time.  A.  Support and encouragement offered, medication given as ordered  R.  Pt remains safe on the unit, will continue to monitor.

## 2017-10-16 LAB — GLUCOSE, CAPILLARY
GLUCOSE-CAPILLARY: 177 mg/dL — AB (ref 65–99)
Glucose-Capillary: 177 mg/dL — ABNORMAL HIGH (ref 65–99)
Glucose-Capillary: 154 mg/dL — ABNORMAL HIGH (ref 65–99)
Glucose-Capillary: 233 mg/dL — ABNORMAL HIGH (ref 65–99)

## 2017-10-16 NOTE — [facility] Group Notes (Addendum)
[place] LCSW Group Therapy Note  Date/Time 10/16/17 10:00 AM  Type of Therapy and Topic:  Group Therapy:  Cognitive Distortions  Participation Level:  Did not attend  Christene Lye MSW, LCSW

## 2017-10-16 NOTE — Progress Notes (Signed)
D.  Pt appears anxious and worried on approach.  Pt was trying to call several facilities from a two page list of possible places to discharge to.  Pt is very afraid that he will have no place to go.  Pt states "nobody will take me".  Pt states "I have a lot to get done tomorrow".  Pt states Ambien only helps him to sleep "about an hour a night".  Pt did not attend evening AA group, sat waiting for phone to be turned back on so that he could make some more calls.  Pt denies SI/HI/AVH at this time.  A.  Support and encouragement offered, encouraged Pt to keep trying tomorrow.  Medication given as ordered.  Gatorade given as requested.  R.  Pt remains safe on the unit, will continue to monitor.

## 2017-10-16 NOTE — Progress Notes (Signed)
D Pt has been out in the milieu most of the day. He endorses same affect: flat, depressed, helpless and hopeless. A HE adamantly refuses to answer and / or complete daily patient assessment sheet. HE says " take that damn paper outta here" , when writer requests he complete / answer questions.  He contracts with Probation officer to not hurt himself and replies " lady..I dont wanna die..I'm tryin to live", in response to Probation officer asking him if he will contract for safety. He allows writer to administer daily nph insulin but refuses sliding scale insulin ( at 1200 and 1700 )  for cbg's 154 and 177 respectively. He requests scheduled klonopin ( at 0800 and 1700 ) be administered early and becomes disgruntled when Probation officer explained he has to wait until medication time. He is assissted by Armed forces training and education officer to place 5 phone calls to various numbers Therapist, sports dialed the numbers per his request) and he is overheard speaking to group homes, requesting housing. He is unsuccessful as of 1700. R Safety is in place.

## 2017-10-16 NOTE — Progress Notes (Signed)
[facility] MD Progress Note  10/16/2017 10:49 AM [name]  MRN:  [id number]   Subjective:  Patient reports that he didn't sleep well last night due to "crazy dreams." he reports that this happens once in a while and he will probably sleep really good tonight. He denies any SI/HI/AVH and contracts for safety. He asks when is he going to get to leave and states that he doesn't have a place to stay and is waiting on the [facility] placement. "I can't take care of my medical problems good when I stayed at a shelter."  Objective: Patient's chart and findings reviewed and discussed with treatment team. Patient is resting in bed and is easily aroused and cooperative. He has been cooperative on the unit but not interacting much.  Principal Problem: Schizoaffective disorder, depressive type ([place]) Diagnosis:   Patient Active Problem List   Diagnosis Date Noted  . MDD (major depressive disorder), recurrent severe, without psychosis ([place]) [F33.2] 10/12/2017  . Diabetic neuropathy ([place]) [E11.40] 11/11/2016  . Anxiety and depression [F41.9, F32.9] 11/11/2016  . Chronic insomnia [F51.04] 01/08/2016  . Alcohol use disorder [IMO0002] 12/10/2015  . Benzodiazepine abuse ([place]) [F13.10] 12/10/2015  . Opioid abuse ([place]) [F11.10] 12/10/2015  . Constipation [K59.00] 02/06/2015  . BPH (benign prostatic hyperplasia) [N40.0] 01/16/2015  . Vitamin D deficiency [E55.9] 01/16/2015  . Allergic rhinitis [J30.9] 11/19/2014  . Chronic low back pain [M54.5, G89.29] 11/07/2014  . Onychomycosis of toenail [B35.1] 11/07/2014  . Tinea pedis [B35.3] 11/07/2014  . Psoriasis [L40.9] 10/14/2014  . HTN (hypertension) [I10] 10/14/2014  . Diabetes mellitus type 2, controlled ([place]) [E11.9] 10/14/2014  . COPD (chronic obstructive pulmonary disease) ([place]) [J44.9] 10/14/2014  . Tobacco abuse [Z72.0] 10/14/2014  . HLD (hyperlipidemia) [E78.5] 10/14/2014  . Schizoaffective disorder, depressive type ([place]) [F25.1] 10/14/2014   Total Time spent  with patient: 15 minutes  Past Psychiatric History: See H&P  Past Medical History:  Past Medical History:  Diagnosis Date  . Alcohol dependence ([place]) 1979   stated abusing ETOH at age 40   . Back pain   . Benzodiazepine dependence ([place])   . Cardiac arrest ([place])   . Chronic back pain   . Cocaine abuse ([place])   . COPD (chronic obstructive pulmonary disease) ([place])   . Diabetes mellitus without complication ([place]) 9563  . Hypertension 2008  . MI (myocardial infarction) ([place])   . Non-compliance   . Schizoaffective disorder ([place]) 2006   . Substance abuse [facility])     Past Surgical History:  Procedure Laterality Date  . CYST EXCISION     Family History:  Family History  Problem Relation Age of Onset  . Heart disease Mother   . Heart disease Father   . Heart disease Maternal Grandmother   . Diabetes Maternal Grandmother    Family Psychiatric  History: See H&P Social History:  Social History   Substance and Sexual Activity  Alcohol Use No  . Alcohol/week: 0.0 oz   Comment: quit     Social History   Substance and Sexual Activity  Drug Use Yes  . Types: Cocaine   Comment: 08/24/17    Social History   Socioeconomic History  . Marital status: Single    Spouse name: None  . Number of children: None  . Years of education: None  . Highest education level: None  Social Needs  . Financial resource strain: None  . Food insecurity - worry: None  . Food insecurity - inability: None  . Transportation needs -  medical: None  . Transportation needs - non-medical: None  Occupational History  . None  Tobacco Use  . Smoking status: Current Every Day Smoker    Packs/day: 1.50    Types: Cigarettes  . Smokeless tobacco: Never Used  Substance and Sexual Activity  . Alcohol use: No    Alcohol/week: 0.0 oz    Comment: quit  . Drug use: Yes    Types: Cocaine    Comment: 08/24/17  . Sexual activity: No  Other Topics Concern  . None  Social History Narrative  . None    Additional Social History:                         Sleep: Fair  Appetite:  Good  Current Medications: Current Facility-Administered Medications  Medication Dose Route Frequency Provider Last Rate Last Dose  . acetaminophen (TYLENOL) tablet 650 mg  650 mg Oral Q6H PRN Benjamine Mola, FNP   650 mg at 10/13/17 1304  . albuterol (PROVENTIL HFA;VENTOLIN HFA) 108 (90 Base) MCG/ACT inhaler 2 puff  2 puff Inhalation Q4H PRN Withrow, Elyse Jarvis, FNP      . aspirin EC tablet 81 mg  81 mg Oral Daily Benjamine Mola, FNP   81 mg at 10/16/17 1829  . atorvastatin (LIPITOR) tablet 40 mg  40 mg Oral q1800 Benjamine Mola, FNP   40 mg at 10/15/17 1706  . clonazePAM (KLONOPIN) tablet 1 mg  1 mg Oral BID Pennelope Bracken, MD   1 mg at 10/16/17 0834  . gabapentin (NEURONTIN) capsule 400 mg  400 mg Oral TID Pennelope Bracken, MD   400 mg at 10/16/17 9371  . ibuprofen (ADVIL,MOTRIN) tablet 800 mg  800 mg Oral Q6H PRN Lindell Spar I, NP   800 mg at 10/15/17 2104  . insulin aspart (novoLOG) injection 0-15 Units  0-15 Units Subcutaneous TID WC Jamaiya Tunnell, Myer Peer, MD   3 Units at 10/16/17 0617  . insulin NPH Human (HUMULIN N,NOVOLIN N) injection 10 Units  10 Units Subcutaneous BID AC & HS Lindell Spar I, NP   10 Units at 10/16/17 [phone]  . isosorbide dinitrate (ISORDIL) tablet 30 mg  30 mg Oral Daily Benjamine Mola, FNP   30 mg at 10/16/17 8938  . lisinopril (PRINIVIL,ZESTRIL) tablet 20 mg  20 mg Oral Daily Benjamine Mola, FNP   20 mg at 10/16/17 1017  . magnesium hydroxide (MILK OF MAGNESIA) suspension 30 mL  30 mL Oral Daily PRN Lindon Romp A, NP   30 mL at 10/15/17 1720  . metFORMIN (GLUCOPHAGE-XR) 24 hr tablet 500 mg  500 mg Oral BID Benjamine Mola, FNP   500 mg at 10/16/17 5102  . methocarbamol (ROBAXIN) tablet 500 mg  500 mg Oral Q8H PRN Shahab Polhamus, Myer Peer, MD   500 mg at 10/15/17 2102  . nicotine (NICODERM CQ - dosed in mg/24 hours) patch 21 mg  21 mg Transdermal Daily Palmina Clodfelter, Myer Peer, MD   21 mg at 10/16/17 [phone]  . ondansetron (ZOFRAN-ODT) disintegrating tablet 4 mg  4 mg Oral Q6H PRN Withrow, John C, FNP      . paliperidone (INVEGA SUSTENNA) injection 234 mg  234 mg Intramuscular Q30 days Pennelope Bracken, MD   234 mg at 10/14/17 1340  . pantoprazole (PROTONIX) EC tablet 40 mg  40 mg Oral Daily Benjamine Mola, FNP   40 mg at 10/16/17 7782  . tamsulosin (FLOMAX) capsule  0.4 mg  0.4 mg Oral Daily Benjamine Mola, FNP   0.4 mg at 10/16/17 3536  . tuberculin injection 5 Units  5 Units Intradermal Once Lindell Spar I, NP      . zolpidem (AMBIEN) tablet 10 mg  10 mg Oral QHS PRN Benjamine Mola, FNP   10 mg at 10/15/17 2101    Lab Results:  Results for orders placed or performed during the hospital encounter of 10/12/17 (from the past 48 hour(s))  Glucose, capillary     Status: Abnormal   Collection Time: 10/14/17 11:58 AM  Result Value Ref Range   Glucose-Capillary 212 (H) 65 - 99 mg/dL   Comment 1 Notify RN    Comment 2 Document in Chart   Glucose, capillary     Status: Abnormal   Collection Time: 10/14/17  5:11 PM  Result Value Ref Range   Glucose-Capillary 217 (H) 65 - 99 mg/dL  Glucose, capillary     Status: Abnormal   Collection Time: 10/14/17  8:37 PM  Result Value Ref Range   Glucose-Capillary 189 (H) 65 - 99 mg/dL   Comment 1 Notify RN   Glucose, capillary     Status: Abnormal   Collection Time: 10/15/17  6:04 AM  Result Value Ref Range   Glucose-Capillary 200 (H) 65 - 99 mg/dL  Glucose, capillary     Status: Abnormal   Collection Time: 10/15/17 11:50 AM  Result Value Ref Range   Glucose-Capillary 194 (H) 65 - 99 mg/dL  Glucose, capillary     Status: Abnormal   Collection Time: 10/15/17  5:01 PM  Result Value Ref Range   Glucose-Capillary 221 (H) 65 - 99 mg/dL  Glucose, capillary     Status: Abnormal   Collection Time: 10/15/17  8:27 PM  Result Value Ref Range   Glucose-Capillary 193 (H) 65 - 99 mg/dL   Comment 1 Notify RN    Comment 2  Document in Chart   Glucose, capillary     Status: Abnormal   Collection Time: 10/16/17  5:58 AM  Result Value Ref Range   Glucose-Capillary 177 (H) 65 - 99 mg/dL    Blood Alcohol level:  Lab Results  Component Value Date   ETH <10 10/11/2017   ETH <5 14/43/1540    Metabolic Disorder Labs: Lab Results  Component Value Date   HGBA1C 8.1 (H) 10/12/2017   MPG 185.77 10/12/2017   MPG 148 07/29/2015   Lab Results  Component Value Date   PROLACTIN 70.0 (H) 12/11/2015   Lab Results  Component Value Date   CHOL 184 11/11/2016   TRIG 204 (H) 11/11/2016   HDL 25 (L) 11/11/2016   CHOLHDL 7.4 (H) 11/11/2016   VLDL 43 (H) 12/11/2015   LDLCALC 118 (H) 11/11/2016   LDLCALC 88 12/11/2015    Physical Findings: AIMS: Facial and Oral Movements Muscles of Facial Expression: None, normal Lips and Perioral Area: None, normal Jaw: None, normal Tongue: None, normal,Extremity Movements Upper (arms, wrists, hands, fingers): None, normal Lower (legs, knees, ankles, toes): None, normal, Trunk Movements Neck, shoulders, hips: None, normal, Overall Severity Severity of abnormal movements (highest score from questions above): None, normal Incapacitation due to abnormal movements: None, normal Patient's awareness of abnormal movements (rate only patient's report): No Awareness, Dental Status Current problems with teeth and/or dentures?: Yes Does patient usually wear dentures?: No  CIWA:    COWS:     Musculoskeletal: Strength & Muscle Tone: within normal limits Gait & Station: normal Patient  leans: N/A  Psychiatric Specialty Exam: Physical Exam  Nursing note and vitals reviewed. Constitutional: He is oriented to person, place, and time. He appears well-developed and well-nourished.  Cardiovascular: Normal rate.  Respiratory: Effort normal.  Musculoskeletal: Normal range of motion.  Neurological: He is alert and oriented to person, place, and time.  Skin: Skin is warm.    Review of  Systems  Constitutional: Negative.   HENT: Negative.   Eyes: Negative.   Respiratory: Negative.   Cardiovascular: Negative.   Gastrointestinal: Negative.   Genitourinary: Negative.   Musculoskeletal: Negative.   Skin: Negative.   Neurological: Negative.   Endo/Heme/Allergies: Negative.   Psychiatric/Behavioral: Positive for depression. Negative for hallucinations and suicidal ideas.    Blood pressure 100/79, pulse 92, temperature 98.5 F (36.9 C), temperature source Oral, resp. rate 16, height 5' 9.5" (1.765 m), weight 99.1 kg (218 lb 8 oz).Body mass index is 31.8 kg/m.  General Appearance: Disheveled  Eye Contact:  Good  Speech:  Clear and Coherent and Normal Rate  Volume:  Normal  Mood:  Depressed  Affect:  Congruent  Thought Process:  Goal Directed and Descriptions of Associations: Intact  Orientation:  Full (Time, Place, and Person)  Thought Content:  WDL  Suicidal Thoughts:  No  Homicidal Thoughts:  No  Memory:  Immediate;   Good Recent;   Good Remote;   Good  Judgement:  Fair  Insight:  Good  Psychomotor Activity:  Normal  Concentration:  Concentration: Good and Attention Span: Good  Recall:  Good  Fund of Knowledge:  Good  Language:  Good  Akathisia:  No  Handed:  Right  AIMS (if indicated):     Assets:  Communication Skills Desire for Improvement Financial Resources/Insurance  ADL's:  Intact  Cognition:  WNL  Sleep:  Number of Hours: 6   Problems Addressed: Schizoaffective, depressive type  Treatment Plan Summary: Daily contact with patient to assess and evaluate symptoms and progress in treatment, Medication management and Plan is to:  -Continue Invega Sustenna 234 mg IM Q30 Days for mood stability Last injection 10/14/17 -Continue klonopin 1 mg PO BID for anxiety -Continue Gabapentin 400 mg PO TID  -CSW planning discharge hopefully with Gh in the near future -Encourage group therapy participation   Lewis Shock, FNP 10/16/2017, 10:49 AM    Agree with NP Progress Note

## 2017-10-16 NOTE — Plan of Care (Signed)
Pt has not been attending group this weekend, has been extremely worried about where he will go and how he will be able to care for himself upon discharge.  Pt has been using the materials provided to try and find placement but has been very discouraged by lack of any place that will take him.  He states "nobody will take me, nobody wants me".  Pt has been able to verbalize his fears and concerns with staff and does continue to try.

## 2017-10-16 NOTE — [facility] Group Notes (Signed)
Healthy Support Systmes  Date:  10/16/2017  Time:  1300  Type of Therapy:  Nurse Education  The group focuses on teaching patients hwo to develop healthy support systemes and the importance of using them to [place] their health.  Participation Level:  Did Not Attend  Participation Quality:    Affect:    Cognitive:    Insight:    Engagement in Group:    Modes of Intervention:    Summary of Progress/Problems:  Lauralyn Primes 10/16/2017, 2:40 PM

## 2017-10-16 NOTE — Progress Notes (Addendum)
D Pt presented to the med window at 0910 and said " would you please give me my medicine..I  need my klonopin". His affect remains flat, dulled and depressed. A He refuses to answer the assessment questions but he is willing to contract for safety with this nurse. R Safety in place. Cont to work on finding placement fo pt.He has made several phone calls to area group homes, requesting info on bed availalbitlity. HE is overheard asking various staff nurses " would you take care of me... I  need a place to live...".TB inserted per MD order.

## 2017-10-17 LAB — GLUCOSE, CAPILLARY
GLUCOSE-CAPILLARY: 141 mg/dL — AB (ref 65–99)
GLUCOSE-CAPILLARY: 195 mg/dL — AB (ref 65–99)
Glucose-Capillary: 144 mg/dL — ABNORMAL HIGH (ref 65–99)

## 2017-10-17 MED ORDER — FLUTICASONE PROPIONATE 50 MCG/ACT NA SUSP
NASAL | Status: AC
Start: 2017-10-17 — End: 2017-10-17
  Filled 2017-10-17: qty 16

## 2017-10-17 MED ORDER — FLUTICASONE PROPIONATE 50 MCG/ACT NA SUSP
1.0000 | Freq: Every day | NASAL | Status: DC
  Administered 2017-10-17: 1 via NASAL
  Filled 2017-10-17: qty 16

## 2017-10-17 NOTE — Plan of Care (Signed)
Nurse discussed anxiety/depression/coping skills with patient.

## 2017-10-17 NOTE — Progress Notes (Signed)
Recreation Therapy Notes  Date: 10/17/17 Time: 0930 Location: 300 Hall Dayroom  Group Topic: Stress Management  Goal Area(s) Addresses:  Patient will verbalize importance of using healthy stress management.  Patient will identify positive emotions associated with healthy stress management.   Intervention: Stress Management  Activity :  Guided Imagery.  LRT introduced the stress management technique of guided imagery.  LRT read a script on letting go of things that hold [place] back.  Patients were to listen and follow along as LRT read script to engage in activity.  Education:  Stress Management, Discharge Planning.   Education Outcome: Acknowledges edcuation/In group clarification offered/Needs additional education  Clinical Observations/Feedback: Pt did not attend group.     Saidy Ormand Graceann Congress A 10/17/2017 11:33 AM

## 2017-10-17 NOTE — Progress Notes (Signed)
Patient refused novolog and lipitor this afternoon.  Patient refused CBG at 1700.  Patient stated "I am worried about where I am going to stay.  I don't need that medicine."

## 2017-10-17 NOTE — Progress Notes (Signed)
D.  Pt has been up since 0200 this morning with various complaints including nasal congestion and lower chronic back pain.  Pt states again the Ambien is only good for about an hour of sleep.  A.  Spoke with NP and received order for Flonase for nasal congestion, gave ordered pain medication for back pain (see pain flowsheet).  R.  Pt continues to sit up in the hall periodically and on his bed at times.  Will continue to monitor for safety.  Pt remains safe.

## 2017-10-17 NOTE — Plan of Care (Signed)
Nurse discussed anxiety, depression, coping skills with patient. 

## 2017-10-17 NOTE — Progress Notes (Signed)
Additional referrals faxed per pt request to other group homes/family care homes in [place], [place] and [place], [place]. [facility] (fax: [phone]: [phone]). The other referral faxed through [place]: [facility], [place], [place], [facility] [place], [facility], and [place].   Maxie Better, MSW, LCSW Clinical Social Worker 10/17/2017 2:13 PM

## 2017-10-17 NOTE — Progress Notes (Signed)
Per patient request, referral/FL2 faxed through Epic system to [facility] in [place], [place]. CSW contacted facility to notify them of incoming referral. CSW continuing to assess. (Patient's referral ID: [id number]).  Maxie Better, MSW, LCSW Clinical Social Worker 10/17/2017 12:34 PM

## 2017-10-17 NOTE — Progress Notes (Signed)
D:   Patient's self inventory sheet, patient has fair sleep, sleep medication helpful.  Good appetite, normal energy level, good concentration.  Rated depression and hopeless 1, anxiety 2.  Denied withdrawals, then checked runny nose.  Denied SI.  Physical problems, pain, feet, worst pain #10.  Pain medicine not helpful. A:  Medications administered per MD orders.  Emotional support and encouragement given patient. R:  Patient denied SI and HI, contracts for safety.  Denied A/V hallucinations.  Safety maintained with 15 minute checks.

## 2017-10-17 NOTE — Care Management Important Message (Signed)
   Per State Regulation 482.30  This chart was reviewed for necessity with respect to the patient's Admission/ Duration of stay.  Next review date: 10/20/17  Skipper Cliche RN, BSN

## 2017-10-17 NOTE — Progress Notes (Signed)
[facility] MD Progress Note  10/17/2017 11:45 AM CODEY BURLING  MRN:  [id number]  Subjective: Dae reports, "I was feeling restless last night because of my anxiety. I did not sleep much. When am I getting discharged? Tell the social worker to call the [facility] group home in [place] area to see if they take me"  Objective: Kenshin Uno is a 56 y/o M with history of MDD versus schizoaffective disorder depressive type who was admitted voluntarily from [facility] ED where he presented after calling 911 himself to report worsened depression and SI with plan to overdose or hang himself. Upon interview, pt shares his reasons for coming to the hospital, stating, "I was suicidal and I'm homeless." Pt shares he has also been struggling with stressor of death of his mother in 02/10/17. He initially denies other symptoms of depression and states that he is "just suicidal, and I wouldn't be suicidal if I wasn't homeless." Pt denies sleep disturbance, anhedonia, guilty feelings, or appetite changes. He denies symptoms of mania, OCD, and PTSD. Today, 10-17-17, Patient is seen, chart reviewed. Chart findings discussed with the treatment team. He is alert, oriented & aware of situation. He is lying down in his bed during this follow-up care assessment. He says he is participating in the group sessions, however, lying down to rest some prior to the next group as he was restless last night because of anxiety.He continues to deny symptoms of depression. Continue to endorse some anxiety symptoms. Has the the tendency to seek medications. Seems to be savvy about mental health care. He is hoping to get into a group home kind of setting after discharge. Has asked for the Social to call the [facility] group home in in [place] area . Social worker is working on the discharge disposition. Patient is taking & tolerating his treatment regimen. No adverse effects. Denies any SIHI, AVH, delusional thoughts or paranoia. Does not appear to  be responding to any internal stimuli.   Principal Problem: Schizoaffective disorder, depressive type ([place])  Diagnosis:   Patient Active Problem List   Diagnosis Date Noted  . Schizoaffective disorder, depressive type ([place]) [F25.1] 10/14/2014    Priority: High  . MDD (major depressive disorder), recurrent severe, without psychosis ([place]) [F33.2] 10/12/2017  . Diabetic neuropathy ([place]) [E11.40] 11/11/2016  . Anxiety and depression [F41.9, F32.9] 11/11/2016  . Chronic insomnia [F51.04] 01/08/2016  . Alcohol use disorder [IMO0002] 12/10/2015  . Benzodiazepine abuse ([place]) [F13.10] 12/10/2015  . Opioid abuse ([place]) [F11.10] 12/10/2015  . Constipation [K59.00] 02/06/2015  . BPH (benign prostatic hyperplasia) [N40.0] 01/16/2015  . Vitamin D deficiency [E55.9] 01/16/2015  . Allergic rhinitis [J30.9] 11/19/2014  . Chronic low back pain [M54.5, G89.29] 11/07/2014  . Onychomycosis of toenail [B35.1] 11/07/2014  . Tinea pedis [B35.3] 11/07/2014  . Psoriasis [L40.9] 10/14/2014  . HTN (hypertension) [I10] 10/14/2014  . Diabetes mellitus type 2, controlled ([place]) [E11.9] 10/14/2014  . COPD (chronic obstructive pulmonary disease) ([place]) [J44.9] 10/14/2014  . Tobacco abuse [Z72.0] 10/14/2014  . HLD (hyperlipidemia) [E78.5] 10/14/2014   Total Time spent with patient: 25 minutes  Past Psychiatric History: See H&P  Past Medical History:  Past Medical History:  Diagnosis Date  . Alcohol dependence ([place]) 02/10/78   stated abusing ETOH at age 49   . Back pain   . Benzodiazepine dependence ([place])   . Cardiac arrest ([place])   . Chronic back pain   . Cocaine abuse ([place])   . COPD (chronic obstructive pulmonary disease) ([place])   .  Diabetes mellitus without complication ([place]) 1610  . Hypertension 2008  . MI (myocardial infarction) ([place])   . Non-compliance   . Schizoaffective disorder ([place]) 2006   . Substance abuse [facility])     Past Surgical History:  Procedure Laterality Date  . CYST EXCISION     Family  History:  Family History  Problem Relation Age of Onset  . Heart disease Mother   . Heart disease Father   . Heart disease Maternal Grandmother   . Diabetes Maternal Grandmother    Family Psychiatric  History: See H&P Social History:  Social History   Substance and Sexual Activity  Alcohol Use No  . Alcohol/week: 0.0 oz   Comment: quit     Social History   Substance and Sexual Activity  Drug Use Yes  . Types: Cocaine   Comment: 08/24/17    Social History   Socioeconomic History  . Marital status: Single    Spouse name: None  . Number of children: None  . Years of education: None  . Highest education level: None  Social Needs  . Financial resource strain: None  . Food insecurity - worry: None  . Food insecurity - inability: None  . Transportation needs - medical: None  . Transportation needs - non-medical: None  Occupational History  . None  Tobacco Use  . Smoking status: Current Every Day Smoker    Packs/day: 1.50    Types: Cigarettes  . Smokeless tobacco: Never Used  Substance and Sexual Activity  . Alcohol use: No    Alcohol/week: 0.0 oz    Comment: quit  . Drug use: Yes    Types: Cocaine    Comment: 08/24/17  . Sexual activity: No  Other Topics Concern  . None  Social History Narrative  . None   Additional Social History:   Sleep: Fair  Appetite:  Good  Current Medications: Current Facility-Administered Medications  Medication Dose Route Frequency Provider Last Rate Last Dose  . acetaminophen (TYLENOL) tablet 650 mg  650 mg Oral Q6H PRN Benjamine Mola, FNP   650 mg at 10/17/17 0402  . albuterol (PROVENTIL HFA;VENTOLIN HFA) 108 (90 Base) MCG/ACT inhaler 2 puff  2 puff Inhalation Q4H PRN Benjamine Mola, FNP   2 puff at 10/16/17 1158  . aspirin EC tablet 81 mg  81 mg Oral Daily Benjamine Mola, FNP   81 mg at 10/17/17 9604  . atorvastatin (LIPITOR) tablet 40 mg  40 mg Oral q1800 Benjamine Mola, FNP   40 mg at 10/16/17 1633  . clonazePAM  (KLONOPIN) tablet 1 mg  1 mg Oral BID Pennelope Bracken, MD   1 mg at 10/17/17 0836  . fluticasone (FLONASE) 50 MCG/ACT nasal spray 1 spray  1 spray Each Nare Daily Lindon Romp A, NP   1 spray at 10/17/17 0318  . gabapentin (NEURONTIN) capsule 400 mg  400 mg Oral TID Pennelope Bracken, MD   400 mg at 10/17/17 0834  . ibuprofen (ADVIL,MOTRIN) tablet 800 mg  800 mg Oral Q6H PRN Lindell Spar I, NP   800 mg at 10/17/17 0502  . insulin aspart (novoLOG) injection 0-15 Units  0-15 Units Subcutaneous TID WC Cobos, Myer Peer, MD   3 Units at 10/16/17 0617  . insulin NPH Human (HUMULIN N,NOVOLIN N) injection 10 Units  10 Units Subcutaneous BID AC & HS Lindell Spar I, NP   10 Units at 10/17/17 0855  . isosorbide dinitrate (ISORDIL) tablet 30 mg  30  mg Oral Daily Benjamine Mola, FNP   30 mg at 10/17/17 0831  . lisinopril (PRINIVIL,ZESTRIL) tablet 20 mg  20 mg Oral Daily Benjamine Mola, FNP   20 mg at 10/17/17 7628  . magnesium hydroxide (MILK OF MAGNESIA) suspension 30 mL  30 mL Oral Daily PRN Lindon Romp A, NP   30 mL at 10/15/17 1720  . metFORMIN (GLUCOPHAGE-XR) 24 hr tablet 500 mg  500 mg Oral BID Benjamine Mola, FNP   500 mg at 10/17/17 0840  . methocarbamol (ROBAXIN) tablet 500 mg  500 mg Oral Q8H PRN Cobos, Myer Peer, MD   500 mg at 10/17/17 0502  . nicotine (NICODERM CQ - dosed in mg/24 hours) patch 21 mg  21 mg Transdermal Daily Cobos, Myer Peer, MD   21 mg at 10/17/17 [phone]  . ondansetron (ZOFRAN-ODT) disintegrating tablet 4 mg  4 mg Oral Q6H PRN Withrow, John C, FNP      . paliperidone (INVEGA SUSTENNA) injection 234 mg  234 mg Intramuscular Q30 days Pennelope Bracken, MD   234 mg at 10/14/17 1340  . pantoprazole (PROTONIX) EC tablet 40 mg  40 mg Oral Daily Benjamine Mola, FNP   40 mg at 10/17/17 7616  . tamsulosin (FLOMAX) capsule 0.4 mg  0.4 mg Oral Daily Benjamine Mola, FNP   0.4 mg at 10/17/17 0737  . tuberculin injection 5 Units  5 Units Intradermal Once Lindell Spar  I, NP   5 Units at 10/16/17 1356  . zolpidem (AMBIEN) tablet 10 mg  10 mg Oral QHS PRN Benjamine Mola, FNP   10 mg at 10/16/17 2106   Lab Results:  Results for orders placed or performed during the hospital encounter of 10/12/17 (from the past 48 hour(s))  Glucose, capillary     Status: Abnormal   Collection Time: 10/15/17 11:50 AM  Result Value Ref Range   Glucose-Capillary 194 (H) 65 - 99 mg/dL  Glucose, capillary     Status: Abnormal   Collection Time: 10/15/17  5:01 PM  Result Value Ref Range   Glucose-Capillary 221 (H) 65 - 99 mg/dL  Glucose, capillary     Status: Abnormal   Collection Time: 10/15/17  8:27 PM  Result Value Ref Range   Glucose-Capillary 193 (H) 65 - 99 mg/dL   Comment 1 Notify RN    Comment 2 Document in Chart   Glucose, capillary     Status: Abnormal   Collection Time: 10/16/17  5:58 AM  Result Value Ref Range   Glucose-Capillary 177 (H) 65 - 99 mg/dL  Glucose, capillary     Status: Abnormal   Collection Time: 10/16/17 11:31 AM  Result Value Ref Range   Glucose-Capillary 154 (H) 65 - 99 mg/dL   Comment 1 Notify RN    Comment 2 Document in Chart   Glucose, capillary     Status: Abnormal   Collection Time: 10/16/17  4:36 PM  Result Value Ref Range   Glucose-Capillary 177 (H) 65 - 99 mg/dL   Comment 1 Notify RN    Comment 2 Document in Chart   Glucose, capillary     Status: Abnormal   Collection Time: 10/16/17  7:36 PM  Result Value Ref Range   Glucose-Capillary 233 (H) 65 - 99 mg/dL  Glucose, capillary     Status: Abnormal   Collection Time: 10/17/17  8:36 AM  Result Value Ref Range   Glucose-Capillary 141 (H) 65 - 99 mg/dL   Blood Alcohol  level:  Lab Results  Component Value Date   ETH <10 10/11/2017   ETH <5 16/07/9603   Metabolic Disorder Labs: Lab Results  Component Value Date   HGBA1C 8.1 (H) 10/12/2017   MPG 185.77 10/12/2017   MPG 148 07/29/2015   Lab Results  Component Value Date   PROLACTIN 70.0 (H) 12/11/2015   Lab Results   Component Value Date   CHOL 184 11/11/2016   TRIG 204 (H) 11/11/2016   HDL 25 (L) 11/11/2016   CHOLHDL 7.4 (H) 11/11/2016   VLDL 43 (H) 12/11/2015   LDLCALC 118 (H) 11/11/2016   LDLCALC 88 12/11/2015   Physical Findings: AIMS: Facial and Oral Movements Muscles of Facial Expression: None, normal Lips and Perioral Area: None, normal Jaw: None, normal Tongue: None, normal,Extremity Movements Upper (arms, wrists, hands, fingers): None, normal Lower (legs, knees, ankles, toes): None, normal, Trunk Movements Neck, shoulders, hips: None, normal, Overall Severity Severity of abnormal movements (highest score from questions above): None, normal Incapacitation due to abnormal movements: None, normal Patient's awareness of abnormal movements (rate only patient's report): No Awareness, Dental Status Current problems with teeth and/or dentures?: Yes Does patient usually wear dentures?: No  CIWA:    COWS:     Musculoskeletal: Strength & Muscle Tone: within normal limits Gait & Station: normal Patient leans: N/A  Psychiatric Specialty Exam: Physical Exam  Nursing note and vitals reviewed.   Review of Systems  Psychiatric/Behavioral: Negative for depression, hallucinations, memory loss, substance abuse and suicidal ideas. The patient is nervous/anxious ("Improving") and has insomnia ("Improving").     Blood pressure 97/71, pulse (!) 106, temperature 97.8 F (36.6 C), temperature source Oral, resp. rate 16, height 5' 9.5" (1.765 m), weight 99.1 kg (218 lb 8 oz).Body mass index is 31.8 kg/m.  General Appearance: Casual  Eye Contact:  Fair  Speech:  Clear and Coherent and Normal Rate  Volume:  Normal  Mood:  Anxious and Irritable  Affect:  Blunt and Labile  Thought Process:  Coherent and Goal Directed  Orientation:  Full (Time, Place, and Person)  Thought Content:  Logical  Suicidal Thoughts:  Yes.  with intent/plan  Homicidal Thoughts:  No  Memory:  Immediate;   Fair Recent;    Fair Remote;   Fair  Judgement:  Poor  Insight:  Lacking  Psychomotor Activity:  Normal  Concentration:  Concentration: Poor  Recall:  Poor  Fund of Knowledge:  Fair  Language:  Fair  Akathisia:  No  Handed:    AIMS (if indicated):     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 Social Support  ADL's:  Intact  Cognition:  WNL  Sleep:  Number of Hours: 5.5     Treatment Plan Summary: Daily contact with patient to assess and evaluate symptoms and progress in treatment: Patient is approaching his baseline. We are working on the discharge disposition.  Continue inpatient hospitalization.  Will continue today 10/17/2017 plan as below except where it is noted.  Anxiety.    - Continue Klonopin 1 mg po bid.  Agitation/diabetix Neuropathy.    - Continue Gabapentin po 400 mg tid.   Mood control.    - Continue Invega Sustenna 234 mg IM Q 30 days, (given on 10-14-17).   Insomnia.    - Continue Ambien 10 mg po Q hs.  TB skin test on 10-16-17 for group home placement (completed).  Other medical issues.    - Continue current medication regimen as recommended. Encourage group participation. SW to continue  to work on the discharge disposition.  Lindell Spar, NP, PMHNP, FNP-BC 10/17/2017, 11:45 AMPatient ID: Tawni Pummel, male   DOB: 02/11/1961, 56 y.o.   MRN: [id number]

## 2017-10-17 NOTE — Plan of Care (Signed)
Nurse discussed depression, anxiety, coping skills with patient.  

## 2017-10-18 LAB — GLUCOSE, CAPILLARY
GLUCOSE-CAPILLARY: 138 mg/dL — AB (ref 65–99)
Glucose-Capillary: 228 mg/dL — ABNORMAL HIGH (ref 65–99)
Glucose-Capillary: 230 mg/dL — ABNORMAL HIGH (ref 65–99)
Glucose-Capillary: 230 mg/dL — ABNORMAL HIGH (ref 65–99)

## 2017-10-18 NOTE — Progress Notes (Signed)
Negative induration for PPD on L forearm.  NP Earleen Newport, NP informed.

## 2017-10-18 NOTE — Progress Notes (Signed)
[facility] MD Progress Note  10/18/2017 10:11 AM [name]  MRN:  [id number] Subjective:   [name] is a 57 y/o M with history of MDD versus schizoaffective disorder depressive type who was admitted voluntarily from [facility] ED where he presented after calling 911 himself to report worsened depression and SI with plan to overdose or hang himself. Pt was restarted on previous home medications of Kirt Boys (given on 10/14/17) and monitored on the inpatient unit. He requested placement at group home, and SW team has been looking into placement options.   As per RN report this AM, pt had refused his capillary blood glucose test and insulin dose this AM, without giving explanation to his refusal.  Upon interview today, pt was met with SW team present. Pt was asked about his refusal of his medications this AM, and he replied, "I did take my medications - just not my insulin." Discussed with patient about the importance of regular monitoring of his blood glucose for safety and proper insulin dosing, and pt verbalized good understanding. He was in agreement to adhere to all physician/nursing directions for the rest of his stay. He denies SI/HI/AH/VH. His only complaint this AM is some chronic lower back pain which is unchanged from baseline. Pt states he has been in contact with group homes himself, and he found a possible locationin [place], [place]. SW team agreed to follow up on this possible placement. Pt was in agreement to continue his current treatment regimen without changes at this time as we continue to attempt to secure group home placement. He had no further questions, comments, or concerns.    Principal Problem: Schizoaffective disorder, depressive type ([place]) Diagnosis:   Patient Active Problem List   Diagnosis Date Noted  . MDD (major depressive disorder), recurrent severe, without psychosis ([place]) [F33.2] 10/12/2017  . Diabetic neuropathy ([place]) [E11.40] 11/11/2016  . Anxiety and depression  [F41.9, F32.9] 11/11/2016  . Chronic insomnia [F51.04] 01/08/2016  . Alcohol use disorder [IMO0002] 12/10/2015  . Benzodiazepine abuse ([place]) [F13.10] 12/10/2015  . Opioid abuse ([place]) [F11.10] 12/10/2015  . Constipation [K59.00] 02/06/2015  . BPH (benign prostatic hyperplasia) [N40.0] 01/16/2015  . Vitamin D deficiency [E55.9] 01/16/2015  . Allergic rhinitis [J30.9] 11/19/2014  . Chronic low back pain [M54.5, G89.29] 11/07/2014  . Onychomycosis of toenail [B35.1] 11/07/2014  . Tinea pedis [B35.3] 11/07/2014  . Psoriasis [L40.9] 10/14/2014  . HTN (hypertension) [I10] 10/14/2014  . Diabetes mellitus type 2, controlled ([place]) [E11.9] 10/14/2014  . COPD (chronic obstructive pulmonary disease) ([place]) [J44.9] 10/14/2014  . Tobacco abuse [Z72.0] 10/14/2014  . HLD (hyperlipidemia) [E78.5] 10/14/2014  . Schizoaffective disorder, depressive type ([place]) [F25.1] 10/14/2014   Total Time spent with patient: 30 minutes  Past Psychiatric History: see H&P  Past Medical History:  Past Medical History:  Diagnosis Date  . Alcohol dependence ([place]) 1979   stated abusing ETOH at age 36   . Back pain   . Benzodiazepine dependence ([place])   . Cardiac arrest ([place])   . Chronic back pain   . Cocaine abuse ([place])   . COPD (chronic obstructive pulmonary disease) ([place])   . Diabetes mellitus without complication ([place]) 6734  . Hypertension 2008  . MI (myocardial infarction) ([place])   . Non-compliance   . Schizoaffective disorder ([place]) 2006   . Substance abuse [facility])     Past Surgical History:  Procedure Laterality Date  . CYST EXCISION     Family History:  Family History  Problem Relation Age of  Onset  . Heart disease Mother   . Heart disease Father   . Heart disease Maternal Grandmother   . Diabetes Maternal Grandmother    Family Psychiatric  History: see H&P Social History:  Social History   Substance and Sexual Activity  Alcohol Use No  . Alcohol/week: 0.0 oz   Comment: quit     Social History    Substance and Sexual Activity  Drug Use Yes  . Types: Cocaine   Comment: 08/24/17    Social History   Socioeconomic History  . Marital status: Single    Spouse name: None  . Number of children: None  . Years of education: None  . Highest education level: None  Social Needs  . Financial resource strain: None  . Food insecurity - worry: None  . Food insecurity - inability: None  . Transportation needs - medical: None  . Transportation needs - non-medical: None  Occupational History  . None  Tobacco Use  . Smoking status: Current Every Day Smoker    Packs/day: 1.50    Types: Cigarettes  . Smokeless tobacco: Never Used  Substance and Sexual Activity  . Alcohol use: No    Alcohol/week: 0.0 oz    Comment: quit  . Drug use: Yes    Types: Cocaine    Comment: 08/24/17  . Sexual activity: No  Other Topics Concern  . None  Social History Narrative  . None   Additional Social History:                         Sleep: Fair  Appetite:  Good  Current Medications: Current Facility-Administered Medications  Medication Dose Route Frequency Provider Last Rate Last Dose  . acetaminophen (TYLENOL) tablet 650 mg  650 mg Oral Q6H PRN Benjamine Mola, FNP   650 mg at 10/17/17 1251  . albuterol (PROVENTIL HFA;VENTOLIN HFA) 108 (90 Base) MCG/ACT inhaler 2 puff  2 puff Inhalation Q4H PRN Benjamine Mola, FNP   2 puff at 10/16/17 1158  . aspirin EC tablet 81 mg  81 mg Oral Daily Benjamine Mola, FNP   81 mg at 10/18/17 8453  . atorvastatin (LIPITOR) tablet 40 mg  40 mg Oral q1800 Benjamine Mola, FNP   40 mg at 10/16/17 1633  . clonazePAM (KLONOPIN) tablet 1 mg  1 mg Oral BID Pennelope Bracken, MD   1 mg at 10/18/17 6468  . fluticasone (FLONASE) 50 MCG/ACT nasal spray 1 spray  1 spray Each Nare Daily Lindon Romp A, NP   1 spray at 10/17/17 0318  . gabapentin (NEURONTIN) capsule 400 mg  400 mg Oral TID Pennelope Bracken, MD   400 mg at 10/18/17 0828  . ibuprofen  (ADVIL,MOTRIN) tablet 800 mg  800 mg Oral Q6H PRN Lindell Spar I, NP   800 mg at 10/18/17 0321  . insulin aspart (novoLOG) injection 0-15 Units  0-15 Units Subcutaneous TID WC Cobos, Myer Peer, MD   3 Units at 10/17/17 1204  . insulin NPH Human (HUMULIN N,NOVOLIN N) injection 10 Units  10 Units Subcutaneous BID AC & HS Nwoko, Agnes I, NP   10 Units at 10/18/17 1000  . isosorbide dinitrate (ISORDIL) tablet 30 mg  30 mg Oral Daily Benjamine Mola, FNP   30 mg at 10/18/17 2248  . lisinopril (PRINIVIL,ZESTRIL) tablet 20 mg  20 mg Oral Daily Benjamine Mola, FNP   20 mg at 10/18/17 0829  . magnesium  hydroxide (MILK OF MAGNESIA) suspension 30 mL  30 mL Oral Daily PRN Lindon Romp A, NP   30 mL at 10/15/17 1720  . metFORMIN (GLUCOPHAGE-XR) 24 hr tablet 500 mg  500 mg Oral BID Benjamine Mola, FNP   500 mg at 10/18/17 0829  . methocarbamol (ROBAXIN) tablet 500 mg  500 mg Oral Q8H PRN Cobos, Myer Peer, MD   500 mg at 10/17/17 2101  . nicotine (NICODERM CQ - dosed in mg/24 hours) patch 21 mg  21 mg Transdermal Daily Cobos, Myer Peer, MD   21 mg at 10/18/17 0830  . ondansetron (ZOFRAN-ODT) disintegrating tablet 4 mg  4 mg Oral Q6H PRN With[place], John C, FNP      . paliperidone (INVEGA SUSTENNA) injection 234 mg  234 mg Intramuscular Q30 days Pennelope Bracken, MD   234 mg at 10/14/17 1340  . pantoprazole (PROTONIX) EC tablet 40 mg  40 mg Oral Daily Benjamine Mola, FNP   40 mg at 10/18/17 7510  . tamsulosin (FLOMAX) capsule 0.4 mg  0.4 mg Oral Daily Benjamine Mola, FNP   0.4 mg at 10/18/17 0829  . tuberculin injection 5 Units  5 Units Intradermal Once Lindell Spar I, NP   5 Units at 10/16/17 1356  . zolpidem (AMBIEN) tablet 10 mg  10 mg Oral QHS PRN Benjamine Mola, FNP   10 mg at 10/17/17 2101    Lab Results:  Results for orders placed or performed during the hospital encounter of 10/12/17 (from the past 48 hour(s))  Glucose, capillary     Status: Abnormal   Collection Time: 10/16/17 11:31 AM   Result Value Ref Range   Glucose-Capillary 154 (H) 65 - 99 mg/dL   Comment 1 Notify RN    Comment 2 Document in Chart   Glucose, capillary     Status: Abnormal   Collection Time: 10/16/17  4:36 PM  Result Value Ref Range   Glucose-Capillary 177 (H) 65 - 99 mg/dL   Comment 1 Notify RN    Comment 2 Document in Chart   Glucose, capillary     Status: Abnormal   Collection Time: 10/16/17  7:36 PM  Result Value Ref Range   Glucose-Capillary 233 (H) 65 - 99 mg/dL  Glucose, capillary     Status: Abnormal   Collection Time: 10/17/17  8:36 AM  Result Value Ref Range   Glucose-Capillary 141 (H) 65 - 99 mg/dL  Glucose, capillary     Status: Abnormal   Collection Time: 10/17/17 11:53 AM  Result Value Ref Range   Glucose-Capillary 195 (H) 65 - 99 mg/dL  Glucose, capillary     Status: Abnormal   Collection Time: 10/17/17  8:57 PM  Result Value Ref Range   Glucose-Capillary 144 (H) 65 - 99 mg/dL  Glucose, capillary     Status: Abnormal   Collection Time: 10/18/17  6:35 AM  Result Value Ref Range   Glucose-Capillary 138 (H) 65 - 99 mg/dL   Comment 1 Notify RN    Comment 2 Document in Chart     Blood Alcohol level:  Lab Results  Component Value Date   ETH <10 10/11/2017   ETH <5 25/85/2778    Metabolic Disorder Labs: Lab Results  Component Value Date   HGBA1C 8.1 (H) 10/12/2017   MPG 185.77 10/12/2017   MPG 148 07/29/2015   Lab Results  Component Value Date   PROLACTIN 70.0 (H) 12/11/2015   Lab Results  Component Value Date  CHOL 184 11/11/2016   TRIG 204 (H) 11/11/2016   HDL 25 (L) 11/11/2016   CHOLHDL 7.4 (H) 11/11/2016   VLDL 43 (H) 12/11/2015   LDLCALC 118 (H) 11/11/2016   LDLCALC 88 12/11/2015    Physical Findings: AIMS: Facial and Oral Movements Muscles of Facial Expression: None, normal Lips and Perioral Area: None, normal Jaw: None, normal Tongue: None, normal,Extremity Movements Upper (arms, wrists, hands, fingers): None, normal Lower (legs, knees,  ankles, toes): None, normal, Trunk Movements Neck, shoulders, hips: None, normal, Overall Severity Severity of abnormal movements (highest score from questions above): None, normal Incapacitation due to abnormal movements: None, normal Patient's awareness of abnormal movements (rate only patient's report): No Awareness, Dental Status Current problems with teeth and/or dentures?: No Does patient usually wear dentures?: No  CIWA:  CIWA-Ar Total: 3 COWS:  COWS Total Score: 2  Musculoskeletal: Strength & Muscle Tone: within normal limits Gait & Station: normal Patient leans: N/A  Psychiatric Specialty Exam: Physical Exam  Nursing note and vitals reviewed.   Review of Systems  Constitutional: Negative for chills and fever.  Respiratory: Negative for cough and shortness of breath.   Gastrointestinal: Negative for abdominal pain, heartburn, nausea and vomiting.  Musculoskeletal: Positive for back pain.  Neurological: Negative for dizziness and headaches.  Psychiatric/Behavioral: Negative for depression, hallucinations and suicidal ideas.    Blood pressure 136/76, pulse 74, temperature 97.8 F (36.6 C), temperature source Oral, resp. rate 16, height 5' 9.5" (1.765 m), weight 99.1 kg (218 lb 8 oz).Body mass index is 31.8 kg/m.  General Appearance: Casual and Disheveled  Eye Contact:  Good  Speech:  Clear and Coherent and Normal Rate  Volume:  Normal  Mood:  Euthymic  Affect:  Appropriate, Congruent and Constricted  Thought Process:  Coherent and Goal Directed  Orientation:  Full (Time, Place, and Person)  Thought Content:  Logical  Suicidal Thoughts:  No  Homicidal Thoughts:  No  Memory:  Immediate;   Fair Recent;   Fair Remote;   Fair  Judgement:  Poor  Insight:  Lacking  Psychomotor Activity:  Normal  Concentration:  Concentration: Poor  Recall:  [employer] of Knowledge:  Fair  Language:  Fair  Akathisia:  No  Handed:    AIMS (if indicated):     Assets:  Music therapist Physical Health Resilience Social Support  ADL's:  Intact  Cognition:  WNL  Sleep:  Number of Hours: 6     Treatment Plan Summary: Daily contact with patient to assess and evaluate symptoms and progress in treatment and Medication management. Pt reports improvement and stability of his mood symptoms, but he continues to desire to have group home placement at this time, and SW team is continuing to investigate placement options.  - Continue inpatient hospitalization  Anxiety.    - Continue Klonopin 1 mg po bid.  Agitation/diabetix Neuropathy.    - Continue Gabapentin po 400 mg tid.   Mood control.    - Continue Invega Sustenna 234 mg IM Q 30 days, (given on 10-14-17).   Insomnia.    - Continue Ambien 10 mg po Q hs.  TB skin test on 10-16-17 for group home placement (completed).  Other medical issues.    - Continue current medication regimen as recommended.  -Encourage group participation. -SW to continue to work on the discharge disposition.    Pennelope Bracken, MD 10/18/2017, 10:11 AM

## 2017-10-18 NOTE — [facility] Group Notes (Signed)
Pt did not attend wrap up group. Pt stayed in bed instead.

## 2017-10-18 NOTE — Progress Notes (Signed)
Pt has been a little more agreeable this evening, although he did not attend evening group.  He denies SI/HI/AVH.  He is focused on getting clothes and items to prepare him for discharge.  He tells Probation officer that he is going to a homeless shelter, but "the lady that runs it is going to help me get into low income housing apartment".  He says he is "excited" about being discharged tomorrow.  He took all his meds and insulin tonight.  He has been drinking a lot of coffee after taking his sleep aid.  Writer encouraged him not to drink coffee this late, but he said, "Don't matter; I'm too excited".  Support and encouragement offered.  Discharge plans are in process.  Safety maintained with q15 minute checks.

## 2017-10-18 NOTE — Plan of Care (Signed)
Nurse discussed depression, anxiety, coping skills with patient.  

## 2017-10-18 NOTE — Progress Notes (Signed)
Adult Psychoeducational Group Note  Date:  10/18/2017 Time:  9:21 PM  Group Topic/Focus:  Wrap-Up Group:   The focus of this group is to help patients review their daily goal of treatment and discuss progress on daily workbooks.  Participation Level:  Did Not Attend  Additional Comments:  Pt did not attend group. RN is aware.   Milus Glazier 10/18/2017, 9:21 PM

## 2017-10-18 NOTE — Progress Notes (Signed)
CSW faxed FL2 and clinicals to Chase Caller (group home owner) per pt request. Fax number provided by [facility]: [phone]; phone: [phone]. Edd Arbour will review with his team and notify CSW if pt has been accepted for placement by the end of the day or tomorrow morning.  Maxie Better, MSW, LCSW Clinical Social Worker 10/18/2017 12:39 PM

## 2017-10-18 NOTE — Progress Notes (Signed)
D:  Patient has been in bed most of the day.  After MD/SW talked with patient this morning, patient agreed to take his NPH 10 units sq.  Patient had refused vital signs and insulin earlier this morning.  Patient has been calling several group homes, etc today trying to find a discharge home.  Patient has cussed staff several times throughout the day.  Patient will apologize and thank staff for their assistance and then he will become angry again and cuss staff.   A:  Emotional support and encouragement given patient. R:  Safety maintained with 15 minute checks.

## 2017-10-18 NOTE — Progress Notes (Signed)
CSW conctaced the following facilities to check status of bed availability/referrals:  [facility]--director out of office until 1/2 [facility]-per Manuela Schwartz, no male beds available. [facility]-no beds [facility] answer today. Will try again on 1/2 Carleene Mains answer-will try again on 1/2 [facility]-no answer; will try again on 1/2 [facility]-left message for director Cleon Dew at [phone] [facility] [place] message [phone]) at patient request.  Maxie Better, MSW, LCSW Clinical Social Worker 10/18/2017 10:16 AM

## 2017-10-18 NOTE — Progress Notes (Signed)
Pt refused morning vitals

## 2017-10-18 NOTE — Progress Notes (Signed)
Pt reports that he may discharge tomorrow to a group home in [place].  He said there was a lady who ran a group home there, and she said there was a bed available.  He has been asking for clothes and a coat to take with him when he leaves.  He denies SI/HI/AVH.  He said that he would not be suicidal if he had a place to live.  He does not want to go to a shelter.  "I wouldn't be able to make it at a shelter."  He took his hs meds early, but has been up and down, asking for gatorade and ginger ale.  He refused his NPH insulin because his CBG was 144 tonight, even though Probation officer tried to explain to him that the NPH insulin worked differently than the [employer].  Support and encouragement offered.  Discharge plans are in process.  Safety maintained with q15 minute checks.

## 2017-10-19 LAB — GLUCOSE, CAPILLARY
Glucose-Capillary: 205 mg/dL — ABNORMAL HIGH (ref 65–99)
Glucose-Capillary: 208 mg/dL — ABNORMAL HIGH (ref 65–99)

## 2017-10-19 MED ORDER — INSULIN ASPART 100 UNIT/ML FLEXPEN: 10.0000 [IU] | PEN_INJECTOR | SUBCUTANEOUS | 0 refills | Status: DC

## 2017-10-19 MED ORDER — ZOLPIDEM TARTRATE 10 MG PO TABS: 10.0000 mg | ORAL_TABLET | Freq: Every evening | ORAL | 0 refills | Status: DC | PRN

## 2017-10-19 MED ORDER — ALBUTEROL SULFATE HFA 108 (90 BASE) MCG/ACT IN AERS: 2.0000 | INHALATION_SPRAY | Freq: Four times a day (QID) | RESPIRATORY_TRACT | 0 refills | Status: DC | PRN

## 2017-10-19 MED ORDER — CLONAZEPAM 0.5 MG PO TABS: 0.5000 mg | ORAL_TABLET | Freq: Three times a day (TID) | ORAL | 0 refills | Status: DC

## 2017-10-19 MED ORDER — ASPIRIN EC 81 MG PO TBEC: 81.0000 mg | DELAYED_RELEASE_TABLET | Freq: Every day | ORAL | 0 refills | Status: DC

## 2017-10-19 MED ORDER — PANTOPRAZOLE SODIUM 40 MG PO TBEC: 40.0000 mg | DELAYED_RELEASE_TABLET | Freq: Every day | ORAL | 0 refills | Status: DC

## 2017-10-19 MED ORDER — METFORMIN HCL ER 500 MG PO TB24: 500.0000 mg | ORAL_TABLET | Freq: Two times a day (BID) | ORAL | 0 refills | Status: DC

## 2017-10-19 MED ORDER — INSULIN NPH (HUMAN) (ISOPHANE) 100 UNIT/ML ~~LOC~~ SUSP: 10.0000 [IU] | Freq: Two times a day (BID) | SUBCUTANEOUS | 0 refills | Status: DC

## 2017-10-19 MED ORDER — GABAPENTIN 400 MG PO CAPS: 400.0000 mg | ORAL_CAPSULE | Freq: Three times a day (TID) | ORAL | 0 refills | Status: DC

## 2017-10-19 MED ORDER — ATORVASTATIN CALCIUM 40 MG PO TABS: ORAL_TABLET | ORAL | 0 refills | Status: DC

## 2017-10-19 MED ORDER — FLUTICASONE PROPIONATE 50 MCG/ACT NA SUSP: 1.0000 | Freq: Every day | NASAL | 0 refills | Status: DC

## 2017-10-19 MED ORDER — ISOSORBIDE DINITRATE 30 MG PO TABS: 30.0000 mg | ORAL_TABLET | Freq: Every day | ORAL | Status: DC

## 2017-10-19 MED ORDER — ONDANSETRON 4 MG PO TBDP: 4.0000 mg | ORAL_TABLET | Freq: Four times a day (QID) | ORAL | 0 refills | Status: DC | PRN

## 2017-10-19 MED ORDER — NICOTINE 21 MG/24HR TD PT24: 21.0000 mg | MEDICATED_PATCH | Freq: Every day | TRANSDERMAL | 0 refills | Status: DC

## 2017-10-19 MED ORDER — ASPIRIN 81 MG PO TBEC: 81.0000 mg | DELAYED_RELEASE_TABLET | Freq: Every day | ORAL | Status: DC

## 2017-10-19 MED ORDER — IBUPROFEN 800 MG PO TABS: 800.0000 mg | ORAL_TABLET | Freq: Four times a day (QID) | ORAL | 0 refills | Status: DC | PRN

## 2017-10-19 MED ORDER — LISINOPRIL 20 MG PO TABS: 20.0000 mg | ORAL_TABLET | Freq: Every day | ORAL | 0 refills | Status: DC

## 2017-10-19 MED ORDER — TAMSULOSIN HCL 0.4 MG PO CAPS: 0.4000 mg | ORAL_CAPSULE | Freq: Every day | ORAL | 0 refills | Status: DC

## 2017-10-19 MED ORDER — PALIPERIDONE PALMITATE 234 MG/1.5ML IM SUSP: 234.0000 mg | INTRAMUSCULAR | 0 refills | Status: DC

## 2017-10-19 NOTE — Progress Notes (Signed)
  [facility] Adult Case Management Discharge Plan :  Will you be returning to the same living situation after discharge:  No.Pt has been accepted into Shelter in [place], [place]. Pt not willing to work with CSW to find group home as of today. "I want to get into low income housing now and the shelter can help me."  At discharge, do you have transportation home?: Yes,  taxi voucher provided, as pt has no other source of transport.  Do you have the ability to pay for your medications: Yes,  medicare  Release of information consent forms completed and submitted to medical records by CSW.  Patient to Follow up at: Follow-up Information    [facility] Follow up on 10/21/2017.   Why:  Hospital follow-up at [facility] on Friday, 10/21/17. Thank you.  Contact information: [address] [place] [address] [phone]           Next level of care provider has access to [place] and Suicide Prevention discussed: Yes,  SPE completed with pt; pt declined to consent to family contact.   Have you used any form of tobacco in the last 30 days? (Cigarettes, Smokeless Tobacco, Cigars, and/or Pipes): Yes  Has patient been referred to the Quitline?: Patient refused referral  Patient has been referred for addiction treatment: Yes  [employer], LCSW 10/19/2017, 9:39 AM

## 2017-10-19 NOTE — Progress Notes (Signed)
Patient was discharged per order after denying SI, HI, and AVH. AVS, SRA, medications, scripts, med samples and transition summary were all reviewed with patient. Pt was given an opportunity to ask questions and verbalized understanding of all discharge paperwork. Belongings were returned, and patient signed for receipt. Patient verbalized readiness for discharge and appeared in no acute distress when escorted to lobby for a [employer] cab to the [employer] in [place].

## 2017-10-19 NOTE — [facility] Suicide Risk Assessment (Signed)
[facility] Discharge Suicide Risk Assessment   Principal Problem: Schizoaffective disorder, depressive type [facility] Discharge Diagnoses:  Patient Active Problem List   Diagnosis Date Noted  . MDD (major depressive disorder), recurrent severe, without psychosis ([place]) [F33.2] 10/12/2017  . Diabetic neuropathy ([place]) [E11.40] 11/11/2016  . Anxiety and depression [F41.9, F32.9] 11/11/2016  . Chronic insomnia [F51.04] 01/08/2016  . Alcohol use disorder [IMO0002] 12/10/2015  . Benzodiazepine abuse ([place]) [F13.10] 12/10/2015  . Opioid abuse ([place]) [F11.10] 12/10/2015  . Constipation [K59.00] 02/06/2015  . BPH (benign prostatic hyperplasia) [N40.0] 01/16/2015  . Vitamin D deficiency [E55.9] 01/16/2015  . Allergic rhinitis [J30.9] 11/19/2014  . Chronic low back pain [M54.5, G89.29] 11/07/2014  . Onychomycosis of toenail [B35.1] 11/07/2014  . Tinea pedis [B35.3] 11/07/2014  . Psoriasis [L40.9] 10/14/2014  . HTN (hypertension) [I10] 10/14/2014  . Diabetes mellitus type 2, controlled ([place]) [E11.9] 10/14/2014  . COPD (chronic obstructive pulmonary disease) ([place]) [J44.9] 10/14/2014  . Tobacco abuse [Z72.0] 10/14/2014  . HLD (hyperlipidemia) [E78.5] 10/14/2014  . Schizoaffective disorder, depressive type ([place]) [F25.1] 10/14/2014    Total Time spent with patient: 30 minutes  Musculoskeletal: Strength & Muscle Tone: within normal limits Gait & Station: normal Patient leans: N/A  Psychiatric Specialty Exam: Review of Systems  Constitutional: Negative for chills and fever.  Cardiovascular: Negative for chest pain.  Gastrointestinal: Negative for heartburn and nausea.  Musculoskeletal: Positive for back pain and joint pain.  Neurological: Negative for dizziness.  Psychiatric/Behavioral: Negative for depression, hallucinations and suicidal ideas. The patient is not nervous/anxious.     Blood pressure 133/81, pulse 79, temperature 97.8 F (36.6 C), temperature source Oral, resp. rate 16, height 5' 9.5"  (1.765 m), weight 99.1 kg (218 lb 8 oz).Body mass index is 31.8 kg/m.  General Appearance: Casual and Disheveled  Eye Contact::  Good  Speech:  Clear and Coherent and Normal Rate  Volume:  Normal  Mood:  Euthymic  Affect:  Appropriate and Congruent  Thought Process:  Coherent and Goal Directed  Orientation:  Full (Time, Place, and Person)  Thought Content:  Logical  Suicidal Thoughts:  No  Homicidal Thoughts:  No  Memory:  Immediate;   Good Recent;   Good Remote;   Good  Judgement:  Fair  Insight:  Fair  Psychomotor Activity:  Normal  Concentration:  Fair  Recall:  [employer] of Knowledge:Fair  Language: Fair  Akathisia:  No  Handed:    AIMS (if indicated):     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 Social Support  Sleep:  Number of Hours: 5.25  Cognition: WNL  ADL's:  Intact   Mental Status Per Nursing Assessment::   On Admission:     Demographic Factors:  Male, Caucasian, Low socioeconomic status and Unemployed  Loss Factors: Financial problems/change in socioeconomic status  Historical Factors: Impulsivity  Risk Reduction Factors:   Positive social support, Positive therapeutic relationship and Positive coping skills or problem solving skills  Continued Clinical Symptoms:  Schizophrenia:   Paranoid or undifferentiated type  Cognitive Features That Contribute To Risk:  None    Suicide Risk:  Mild:  Suicidal ideation of limited frequency, intensity, duration, and specificity.  There are no identifiable plans, no associated intent, mild dysphoria and related symptoms, good self-control (both objective and subjective assessment), few other risk factors, and identifiable protective factors, including available and accessible social support.  Follow-up Information    Services, [facility] Follow up on 10/21/2017.   Why:  Hospital follow-up at [place] on Friday,  10/21/17. Thank you.  Contact information: [address] [place]  [id number] [phone]         Subjective Data: [name] is a 57 y/o M with history of MDD versus schizoaffective disorder depressive type who was admitted voluntarily from [facility] ED where he presented after calling 911 himself to report worsened depression and SI with plan to overdose or hang himself. Pt was restarted on previous home medications of Kirt Boys (given on 10/14/17) and monitored on the inpatient unit. He requested placement at group home, and SW team worked with patient into starting that process. Patient also has been working on Office manager options, and yesterday evening he was able to secure placement at [facility] in [place], [place].  Today upon evaluation, pt reports he is doing well and he is looking forward to staying at the shelter in [place]. He states, "I have news for you - I got into that shelter, and I'm planning on going to [facility]." Pt denies SI/HI/AH/VH. He reports that he is sleeping well and his appetite is good. His only concern today is some joint pain and he requests for prescriptions for ibuprofen as he has no ability to pay for over the counter medications at this time. He also requests for a medication for a "cold sore" on his inner lip. Pt was able to engage in safety planning including plan to contact emergency services or return to [facility] if he feels unable to maintain his own safety or the safety of others. Pt had no further questions, comments, or concerns.   Plan Of Care/Follow-up recommendations:   - Discharge to outpatient level of care  Anxiety/diabetic Neuropathy. - Continue Gabapentin po 400 mg tid.   Mood control. - Continue Invega Sustenna 234 mg IM Q 30 days, (given on12-28-18).  Insomnia. - Continue Ambien 10 mg po Q hs.  Other medical issues. - Continue current medication regimen as recommended.   Activity:  as tolerated Diet:  normal Tests:  NA Other:  see above for DC plan  Pennelope Bracken, MD 10/19/2017, 9:54 AM

## 2017-10-19 NOTE — Discharge Summary (Signed)
Physician Discharge Summary Note  Patient:  [name] is an 57 y.o., male MRN:  [id number] DOB:  1960/12/05 Patient phone:  [phone] (home)  Patient address:   [address] [place] [address],  Total Time spent with patient: Greater than 30 minutes  Date of Admission:  10/12/2017 Date of Discharge: 10-19-17  Reason for Admission: Worsening symptoms depression & suicidal thoughts.   Principal Problem: Schizoaffective disorder, depressive type [facility]) Discharge Diagnoses: Patient Active Problem List   Diagnosis Date Noted  . Schizoaffective disorder, depressive type ([place]) [F25.1] 10/14/2014    Priority: High  . MDD (major depressive disorder), recurrent severe, without psychosis ([place]) [F33.2] 10/12/2017  . Diabetic neuropathy ([address]) [E11.40] 11/11/2016  . Anxiety and depression [F41.9, F32.9] 11/11/2016  . Chronic insomnia [F51.04] 01/08/2016  . Alcohol use disorder [IMO0002] 12/10/2015  . Benzodiazepine abuse ([place]) [F13.10] 12/10/2015  . Opioid abuse ([place]) [F11.10] 12/10/2015  . Constipation [K59.00] 02/06/2015  . BPH (benign prostatic hyperplasia) [N40.0] 01/16/2015  . Vitamin D deficiency [E55.9] 01/16/2015  . Allergic rhinitis [J30.9] 11/19/2014  . Chronic low back pain [M54.5, G89.29] 11/07/2014  . Onychomycosis of toenail [B35.1] 11/07/2014  . Tinea pedis [B35.3] 11/07/2014  . Psoriasis [L40.9] 10/14/2014  . HTN (hypertension) [I10] 10/14/2014  . Diabetes mellitus type 2, controlled ([place]) [E11.9] 10/14/2014  . COPD (chronic obstructive pulmonary disease) ([place]) [J44.9] 10/14/2014  . Tobacco abuse [Z72.0] 10/14/2014  . HLD (hyperlipidemia) [E78.5] 10/14/2014   Past Psychiatric History: Hx. Schizoaffective disorder  Past Medical History:  Past Medical History:  Diagnosis Date  . Alcohol dependence ([place]) 1979   stated abusing ETOH at age 51   . Back pain   . Benzodiazepine dependence ([place])   . Cardiac arrest ([place])   . Chronic back pain   . Cocaine abuse  ([place])   . COPD (chronic obstructive pulmonary disease) ([place])   . Diabetes mellitus without complication ([place]) 3545  . Hypertension 2008  . MI (myocardial infarction) ([place])   . Non-compliance   . Schizoaffective disorder ([place]) 2006   . Substance abuse [facility])     Past Surgical History:  Procedure Laterality Date  . CYST EXCISION     Family History:  Family History  Problem Relation Age of Onset  . Heart disease Mother   . Heart disease Father   . Heart disease Maternal Grandmother   . Diabetes Maternal Grandmother    Family Psychiatric  History: See H&P Social History:  Social History   Substance and Sexual Activity  Alcohol Use No  . Alcohol/week: 0.0 oz   Comment: quit     Social History   Substance and Sexual Activity  Drug Use Yes  . Types: Cocaine   Comment: 08/24/17    Social History   Socioeconomic History  . Marital status: Single    Spouse name: None  . Number of children: None  . Years of education: None  . Highest education level: None  Social Needs  . Financial resource strain: None  . Food insecurity - worry: None  . Food insecurity - inability: None  . Transportation needs - medical: None  . Transportation needs - non-medical: None  Occupational History  . None  Tobacco Use  . Smoking status: Current Every Day Smoker    Packs/day: 1.50    Types: Cigarettes  . Smokeless tobacco: Never Used  Substance and Sexual Activity  . Alcohol use: No    Alcohol/week: 0.0 oz    Comment: quit  . Drug  use: Yes    Types: Cocaine    Comment: 08/24/17  . Sexual activity: No  Other Topics Concern  . None  Social History Narrative  . None   Hospital Course: (Per admission notes): This is an admission assessment for this 57 year old Caucasian male with hx of alcoholism. Admitted to the [facility] from the [facility] with complaints of suicidal ideations with plans to overdose on medications or hang himself. His UDS was clear of all substances. During this  assessment, [name] reports, "The ambulance took me to the hospital 3 days ago. I called them. I'm homeless for one thing. I did not feel good on that day as well. I don't remember what was wrong with me. I think I was feeling like killing myself. Different things brought up the feeling. My mother died in 2023/02/06 of this year. My nephew also died. I got very depressed because I missed my mother. Soon after mama died, we lost the home. I have been homeless since. I moved in with my sister, but, my name was not on the lease, so I was made to leave her home. I don't take depression medicines because I don't like them. They make me sick. I had some kind of side effects from them. I don't remember what it did to me. I will need the doctor to call these two places for me ([facility]) to see if they can take me in. I'm homeless. My back is killing me. Do you have something stronger than Ibuprofen for my back pain. Can you increase my Klonopin to 1 mg. I got bad anxiety & not depression".  After the above admission assessment, [name] was started on the medication regimen for his presenting symptoms. During his admission assessment, he maintained that he was not depressed, rather, stated that anxiety has been his major problem. As a result, did not receive any antidepressant medications. He was however, fixated on having the social worker calling several group homes in [place]  area to see if they will accept him as a resident. Reports indicated that patient has resided in several group home settings in the past & was kicked out of each & everyone of them. He was residing with his sister prior to this admission.  During the course of his hospitalization, [name] received & was discharged on; Klonopin 0.5 mg po for severe anxiety, Gabapentin 400 mg for agitation/diabetic/neuropathic pain, Nicotine patch 21 mg for nicotine withdrawal, Paliperidone IM monthly injection 234 mg/ml due on 11-13-17 & Ambien  10 mg Q hs prn for insomnia. He also presented other significant medical issues that required treatment. He was resumed on all his pertinent home medications for those health issues. He tolerated his treatment regimen without any adverse effects or reactions reported.  Darus is seen today by his attending psychiatrist for discharge evaluation. He stated that he is pleased that he sought help. Says he is tolerating his medications well. He is no longer feeling depressed. Describes normal energy and ability to think. Able to focus on task. No suicidal thoughts. No homicidal thoughts. No thoughts of violence. Patient reports normal biological functions. He prefers being discharged to a shelter in the [place], [place] area as he believed they will help him find a suitable group home setting to reside in.    The nursing staff reports that patient has been appropriate on the unit. Patient has been interacting well with peers & staff. No behavioral issues. Patient has  not voiced any suicidal thoughts. Patient has not been observed to be internally stimulated or occupied. Patient has been adherent with treatment recommendations. Patient has been tolerating his medication well, denies any adverse reactions or side effects.   Patient was discussed at the team meeting this morning. Team members feel that patient is back to his baseline level of function. Team agrees with plan to discharge patient today to continue mental health care on outpatient basis as noted below. Upon discharge, Benjaman adamantly denies any SIHI, AVH, delusional thoughts or paranoia. He will continue further psychiatric follow-up care/medication management on an outpatient basis as noted below. He was provided with all the necessary information needed to make this appointment without problems. He left [facility] with all personal belongings in no apparent distress. Transportation per taxi. [place] assisted with taxi fare.  Physical Findings: AIMS: Facial and Oral  Movements Muscles of Facial Expression: None, normal Lips and Perioral Area: None, normal Jaw: None, normal Tongue: None, normal,Extremity Movements Upper (arms, wrists, hands, fingers): None, normal Lower (legs, knees, ankles, toes): None, normal, Trunk Movements Neck, shoulders, hips: None, normal, Overall Severity Severity of abnormal movements (highest score from questions above): None, normal Incapacitation due to abnormal movements: None, normal Patient's awareness of abnormal movements (rate only patient's report): No Awareness, Dental Status Current problems with teeth and/or dentures?: No Does patient usually wear dentures?: No  CIWA:  CIWA-Ar Total: 3 COWS:  COWS Total Score: 3  Musculoskeletal: Strength & Muscle Tone: within normal limits Gait & Station: normal Patient leans: N/A  Psychiatric Specialty Exam: Physical Exam  Constitutional: He appears well-developed.  HENT:  Head: Normocephalic.  Eyes: Pupils are equal, round, and reactive to light.  Neck: Normal range of motion.  Cardiovascular: Normal rate.  Respiratory: Effort normal.  GI: Soft.  Genitourinary:  Genitourinary Comments: Deferred  Musculoskeletal: Normal range of motion.  Neurological: He is alert.  Skin: Skin is warm.    ROS  Blood pressure 133/81, pulse 79, temperature 97.8 F (36.6 C), temperature source Oral, resp. rate 16, height 5' 9.5" (1.765 m), weight 99.1 kg (218 lb 8 oz).Body mass index is 31.8 kg/m.  See Md's SRA   Have you used any form of tobacco in the last 30 days? (Cigarettes, Smokeless Tobacco, Cigars, and/or Pipes): Yes  Has this patient used any form of tobacco in the last 30 days? (Cigarettes, Smokeless Tobacco, Cigars, and/or Pipes): Yes, an FDA-approved tobacco cessation medication was offered at discharge.  Blood Alcohol level:  Lab Results  Component Value Date   ETH <10 10/11/2017   ETH <5 41/32/4401   Metabolic Disorder Labs:  Lab Results  Component Value Date    HGBA1C 8.1 (H) 10/12/2017   MPG 185.77 10/12/2017   MPG 148 07/29/2015   Lab Results  Component Value Date   PROLACTIN 70.0 (H) 12/11/2015   Lab Results  Component Value Date   CHOL 184 11/11/2016   TRIG 204 (H) 11/11/2016   HDL 25 (L) 11/11/2016   CHOLHDL 7.4 (H) 11/11/2016   VLDL 43 (H) 12/11/2015   LDLCALC 118 (H) 11/11/2016   LDLCALC 88 12/11/2015   See Psychiatric Specialty Exam and Suicide Risk Assessment completed by Attending Physician prior to discharge.  Discharge destination:  Home  Is patient on multiple antipsychotic therapies at discharge:  No   Has Patient had three or more failed trials of antipsychotic monotherapy by history:  No  Recommended Plan for Multiple Antipsychotic Therapies: NA  Allergies as of 10/19/2017  Reactions   Quetiapine Anaphylaxis   Seroquel [quetiapine Fumarate] Anxiety, Other (See Comments)   Reaction:  Nightmares    Trazodone Anaphylaxis   Trazodone And Nefazodone Anxiety, Other (See Comments)   Other reaction(s): Unknown Nightmares Reaction:  Nightmares    Haldol [haloperidol Lactate] Anxiety, Other (See Comments)   Reaction:  Nightmares       Medication List    STOP taking these medications   cyclobenzaprine 5 MG tablet Commonly known as:  FLEXERIL   escitalopram 20 MG tablet Commonly known as:  LEXAPRO   Fluticasone-Salmeterol 100-50 MCG/DOSE Aepb Commonly known as:  ADVAIR   gabapentin 800 MG tablet Commonly known as:  NEURONTIN Replaced by:  gabapentin 400 MG capsule   hydrOXYzine 25 MG capsule Commonly known as:  VISTARIL   metoprolol tartrate 25 MG tablet Commonly known as:  LOPRESSOR   risperiDONE 0.5 MG tablet Commonly known as:  RISPERDAL     TAKE these medications     Indication  albuterol 108 (90 Base) MCG/ACT inhaler Commonly known as:  PROVENTIL HFA;VENTOLIN HFA Inhale 2 puffs into the lungs every 6 (six) hours as needed for wheezing or shortness of breath. What changed:  when to take  this  Indication:  Asthma   aspirin EC 81 MG tablet Take 1 tablet (81 mg total) by mouth daily. For heart health What changed:  additional instructions  Indication:  Heart health   aspirin 81 MG EC tablet Take 1 tablet (81 mg total) by mouth daily. For heart health Start taking on:  10/20/2017 What changed:  You were already taking a medication with the same name, and this prescription was added. Make sure you understand how and when to take each.  Indication:  Heart health   atorvastatin 40 MG tablet Commonly known as:  LIPITOR TAKE 1 TABLET BY MOUTH EVERY DAY: For high Cholesterol What changed:  See the new instructions.  Indication:  Inherited Heterozygous Hypercholesterolemia, High Amount of Fats in the Blood, High Amount of Triglycerides in the Blood   clonazePAM 0.5 MG tablet Commonly known as:  KLONOPIN Take 1 tablet (0.5 mg total) by mouth 3 (three) times daily.  Indication:  Severe anxiety   fluticasone 50 MCG/ACT nasal spray Commonly known as:  FLONASE Place 1 spray into both nostrils daily. For allergies Start taking on:  10/20/2017  Indication:  Allergic Rhinitis   gabapentin 400 MG capsule Commonly known as:  NEURONTIN Take 1 capsule (400 mg total) by mouth 3 (three) times daily. For agitation/neuropathic pain Replaces:  gabapentin 800 MG tablet  Indication:  Agitation, Neuropathic Pain   ibuprofen 800 MG tablet Commonly known as:  ADVIL,MOTRIN Take 1 tablet (800 mg total) by mouth every 6 (six) hours as needed (moderate to severe pain).  Indication:  Pain management   insulin aspart 100 UNIT/ML FlexPen Commonly known as:  NOVOLOG FLEXPEN Inject 10-15 Units into the skin as directed. For diabetese What changed:  additional instructions  Indication:  Type 2 Diabetes   insulin NPH Human 100 UNIT/ML injection Commonly known as:  HUMULIN N,NOVOLIN N Inject 0.1 mLs (10 Units total) into the skin 2 (two) times daily at 8 am and 10 pm.  Indication:  Type 2  Diabetes   isosorbide dinitrate 30 MG tablet Commonly known as:  ISORDIL Take 1 tablet (30 mg total) by mouth daily. For chest pain What changed:  additional instructions  Indication:  Stable Angina Pectoris   lisinopril 20 MG tablet Commonly known as:  PRINIVIL,ZESTRIL Take  1 tablet (20 mg total) by mouth daily. For high blood pressure Start taking on:  10/20/2017 What changed:    medication strength  how much to take  additional instructions  Indication:  High Blood Pressure Disorder   metFORMIN 500 MG 24 hr tablet Commonly known as:  GLUCOPHAGE-XR Take 1 tablet (500 mg total) by mouth 2 (two) times daily. For diabetes  Indication:  Type 2 Diabetes   nicotine 21 mg/24hr patch Commonly known as:  NICODERM CQ - dosed in mg/24 hours Place 1 patch (21 mg total) onto the skin daily. (May buy from over the counter at the pharmacy): For smoking cessation Start taking on:  10/20/2017  Indication:  Nicotine Addiction   ondansetron 4 MG disintegrating tablet Commonly known as:  ZOFRAN-ODT Take 1 tablet (4 mg total) by mouth every 6 (six) hours as needed for nausea or vomiting.  Indication:  Nausea and Vomiting   paliperidone 234 MG/1.5ML Susp injection Commonly known as:  INVEGA SUSTENNA Inject 234 mg into the muscle every 30 (thirty) days. (Due on 11-13-17): For mood control Start taking on:  11/13/2017 What changed:  additional instructions  Indication:  Schizoaffective Disorder   pantoprazole 40 MG tablet Commonly known as:  PROTONIX Take 1 tablet (40 mg total) by mouth daily. For acid reflux Start taking on:  10/20/2017  Indication:  Gastroesophageal Reflux Disease   tamsulosin 0.4 MG Caps capsule Commonly known as:  FLOMAX Take 1 capsule (0.4 mg total) by mouth daily. For Prostate health What changed:  additional instructions  Indication:  Benign Enlargement of Prostate   zolpidem 10 MG tablet Commonly known as:  AMBIEN Take 1 tablet (10 mg total) by mouth at bedtime  as needed for sleep.  Indication:  Trouble Sleeping      Follow-up Information    [facility], [facility] Follow up on 10/21/2017.   Why:  Hospital follow-up at [facility] on Friday, 10/21/17. Thank you.  Contact information: [address] [place] [address] [phone]          Follow-up recommendations: Activity:  As tolerated Diet: As recommended by your primary care doctor. Keep all scheduled follow-up appointments as recommended.   Comments: Patient is instructed prior to discharge to: Take all medications as prescribed by his/her mental healthcare provider. Report any adverse effects and or reactions from the medicines to his/her outpatient provider promptly. Patient has been instructed & cautioned: To not engage in alcohol and or illegal drug use while on prescription medicines. In the event of worsening symptoms, patient is instructed to call the crisis hotline, 911 and or go to the nearest ED for appropriate evaluation and treatment of symptoms. To follow-up with his/her primary care provider for your other medical issues, concerns and or health care needs.     Signed: Lindell Spar, NP, PMHNP, FNP-BC 10/19/2017, 10:06 AM   Patient seen, Suicide Assessment Completed.  Disposition Plan Reviewed   Smokey Melott is a 57 y/o M with history of MDD versus schizoaffective disorder depressive type who was admitted voluntarily from [facility] ED where he presented after calling 911 himself to report worsened depression and SI with plan to overdose or hang himself.Pt was restarted on previous home medications of Kirt Boys (given on 10/14/17) and monitored on the inpatient unit. He requested placement at group home, and SW team worked with patient into starting that process. Patient also has been working on Office manager options, and yesterday evening he was able to secure placement at [facility] in [place], [place].  Today upon evaluation, pt reports he is doing well and he  is looking forward to staying at the shelter in [place]. He states, "I have news for you - I got into that shelter, and I'm planning on going to [facility]." Pt denies SI/HI/AH/VH. He reports that he is sleeping well and his appetite is good. His only concern today is some joint pain and he requests for prescriptions for ibuprofen as he has no ability to pay for over the counter medications at this time. He also requests for a medication for a "cold sore" on his inner lip. Pt was able to engage in safety planning including plan to contact emergency services or return to [facility] if he feels unable to maintain his own safety or the safety of others. Pt had no further questions, comments, or concerns.   Plan Of Care/Follow-up recommendations:   - Discharge to outpatient level of care  Anxiety/diabetic Neuropathy. - Continue Gabapentin po 400 mg tid.   Mood control. - Continue Invega Sustenna 234 mg IM Q 30 days, (given on12-28-18).  Insomnia. - Continue Ambien 10 mg po Q hs.  Other medical issues. - Continue current medication regimen as recommended.   Activity:  as tolerated Diet:  normal Tests:  NA Other:  see above for DC plan  Pennelope Bracken, MD

## 2017-10-19 NOTE — Progress Notes (Signed)
Recreation Therapy Notes  Date: 10/19/17 Time: 0930 Location: [address] Dayroom  Group Topic: Stress Management  Goal Area(s) Addresses:  Patient will verbalize importance of using healthy stress management.  Patient will identify positive emotions associated with healthy stress management.   Behavioral Response:  Engaged  Intervention: Stress Management  Activity :  Body Scan Meditation.  LRT introduced the stress management technique of meditation.  LRT played a script that guided patients through a body scan that allowed patients to become aware of any sensations they may have been experiencing.  Education:  Stress Management, Discharge Planning.   Education Outcome: Acknowledges edcuation/In group clarification offered/Needs additional education  Clinical Observations/Feedback: Pt attended group.    Victorino Sparrow, LRT/CTRS         Victorino Sparrow A 10/19/2017 12:56 PM

## 2017-10-19 NOTE — Tx Team (Signed)
Interdisciplinary Treatment and Diagnostic Plan Update  10/19/2017 Time of Session: 0830AM [name] MRN: [id number]  Principal Diagnosis: Schizoaffective disorder, depressive type [facility])  Secondary Diagnoses: Principal Problem:   Schizoaffective disorder, depressive type ([place]) Active Problems:   MDD (major depressive disorder), recurrent severe, without psychosis ([place])   Current Medications:  Current Facility-Administered Medications  Medication Dose Route Frequency Provider Last Rate Last Dose  . acetaminophen (TYLENOL) tablet 650 mg  650 mg Oral Q6H PRN Benjamine Mola, FNP   650 mg at 10/17/17 1251  . albuterol (PROVENTIL HFA;VENTOLIN HFA) 108 (90 Base) MCG/ACT inhaler 2 puff  2 puff Inhalation Q4H PRN Benjamine Mola, FNP   2 puff at 10/16/17 1158  . aspirin EC tablet 81 mg  81 mg Oral Daily Benjamine Mola, FNP   81 mg at 10/18/17 6440  . atorvastatin (LIPITOR) tablet 40 mg  40 mg Oral q1800 Benjamine Mola, FNP   40 mg at 10/18/17 1719  . clonazePAM (KLONOPIN) tablet 1 mg  1 mg Oral BID Pennelope Bracken, MD   1 mg at 10/18/17 1619  . fluticasone (FLONASE) 50 MCG/ACT nasal spray 1 spray  1 spray Each Nare Daily Lindon Romp A, NP   1 spray at 10/17/17 0318  . gabapentin (NEURONTIN) capsule 400 mg  400 mg Oral TID Pennelope Bracken, MD   400 mg at 10/18/17 1620  . ibuprofen (ADVIL,MOTRIN) tablet 800 mg  800 mg Oral Q6H PRN Lindell Spar I, NP   800 mg at 10/18/17 2120  . insulin aspart (novoLOG) injection 0-15 Units  0-15 Units Subcutaneous TID WC Cobos, Myer Peer, MD   5 Units at 10/19/17 0704  . insulin NPH Human (HUMULIN N,NOVOLIN N) injection 10 Units  10 Units Subcutaneous BID AC & HS Lindell Spar I, NP   10 Units at 10/18/17 2122  . isosorbide dinitrate (ISORDIL) tablet 30 mg  30 mg Oral Daily Benjamine Mola, FNP   30 mg at 10/18/17 3474  . lisinopril (PRINIVIL,ZESTRIL) tablet 20 mg  20 mg Oral Daily Benjamine Mola, FNP   20 mg at 10/18/17 0829  . magnesium  hydroxide (MILK OF MAGNESIA) suspension 30 mL  30 mL Oral Daily PRN Lindon Romp A, NP   30 mL at 10/15/17 1720  . metFORMIN (GLUCOPHAGE-XR) 24 hr tablet 500 mg  500 mg Oral BID Benjamine Mola, FNP   500 mg at 10/18/17 1719  . methocarbamol (ROBAXIN) tablet 500 mg  500 mg Oral Q8H PRN Cobos, Myer Peer, MD   500 mg at 10/18/17 2120  . nicotine (NICODERM CQ - dosed in mg/24 hours) patch 21 mg  21 mg Transdermal Daily Cobos, Myer Peer, MD   21 mg at 10/18/17 0830  . ondansetron (ZOFRAN-ODT) disintegrating tablet 4 mg  4 mg Oral Q6H PRN Withrow, John C, FNP      . paliperidone (INVEGA SUSTENNA) injection 234 mg  234 mg Intramuscular Q30 days Pennelope Bracken, MD   234 mg at 10/14/17 1340  . pantoprazole (PROTONIX) EC tablet 40 mg  40 mg Oral Daily Benjamine Mola, FNP   40 mg at 10/18/17 2595  . tamsulosin (FLOMAX) capsule 0.4 mg  0.4 mg Oral Daily Withrow, Elyse Jarvis, FNP   0.4 mg at 10/18/17 6387  . zolpidem (AMBIEN) tablet 10 mg  10 mg Oral QHS PRN Benjamine Mola, FNP   10 mg at 10/18/17 2120   PTA Medications: Medications Prior to Admission  Medication Sig Dispense Refill Last Dose  . albuterol (PROVENTIL HFA;VENTOLIN HFA) 108 (90 Base) MCG/ACT inhaler Inhale 2 puffs into the lungs every 6 (six) hours as needed for wheezing or shortness of breath. (Patient taking differently: Inhale 2 puffs into the lungs every 4 (four) hours as needed for wheezing or shortness of breath. ) 1 Inhaler 11 Past Week at Unknown time  . aspirin EC 81 MG tablet Take 1 tablet (81 mg total) by mouth daily. Reported on 01/02/2016 30 tablet 11 Past Week at Unknown time  . atorvastatin (LIPITOR) 40 MG tablet TAKE 1 TABLET BY MOUTH EVERY DAY - EMERGENCY REFILL FAXED DR 30 tablet 4 Past Week at Unknown time  . clonazePAM (KLONOPIN) 0.5 MG tablet Take 0.5 mg by mouth 3 (three) times daily.  0 Past Week at Unknown time  . cyclobenzaprine (FLEXERIL) 5 MG tablet TAKE ONE TABLET BY MOUTH EVERY 8 HOURS AS NEEDED FOR MUSCLE  SPASMS  0 Past Week at Unknown time  . escitalopram (LEXAPRO) 20 MG tablet Take 20 mg by mouth daily.   Past Week at Unknown time  . Fluticasone-Salmeterol (ADVAIR) 100-50 MCG/DOSE AEPB Inhale 1 puff into the lungs 2 (two) times daily. (Patient taking differently: Inhale 2 puffs into the lungs 2 (two) times daily. ) 1 each 5 Past Week at Unknown time  . gabapentin (NEURONTIN) 800 MG tablet Take 1 tablet (800 mg total) by mouth 5 (five) times daily. (Patient taking differently: Take 800 mg by mouth 4 (four) times daily. ) 150 tablet 0 Past Week at Unknown time  . hydrOXYzine (VISTARIL) 25 MG capsule Take 25 mg by mouth 3 (three) times daily as needed.   Past Week at Unknown time  . insulin aspart (NOVOLOG FLEXPEN) 100 UNIT/ML FlexPen Inject 10-15 Units into the skin as directed.    Past Week at Unknown time  . isosorbide dinitrate (ISORDIL) 30 MG tablet Take 30 mg by mouth daily.   Past Week at Unknown time  . lisinopril (PRINIVIL,ZESTRIL) 10 MG tablet Take 10 mg by mouth daily.   Past Week at Unknown time  . metoprolol tartrate (LOPRESSOR) 25 MG tablet Take 25 mg by mouth 2 (two) times daily.   Past Week at Unknown time  . paliperidone (INVEGA SUSTENNA) 234 MG/1.5ML SUSP injection Inject 234 mg into the muscle every 30 (thirty) days.   unknown  . risperiDONE (RISPERDAL) 0.5 MG tablet Take 0.5 mg by mouth daily.   Past Week at Unknown time  . tamsulosin (FLOMAX) 0.4 MG CAPS capsule Take 0.4 mg by mouth daily.   unknown  . zolpidem (AMBIEN) 10 MG tablet Take 10 mg by mouth at bedtime as needed for sleep.   Past Week at Unknown time    Patient Stressors: Financial difficulties Health problems Loss of mother  Patient Strengths: Ability for insight Average or above average intelligence General fund of knowledge Motivation for treatment/growth  Treatment Modalities: Medication Management, Group therapy, Case management,  1 to 1 session with clinician, Psychoeducation, Recreational  therapy.   Physician Treatment Plan for Primary Diagnosis: Schizoaffective disorder, depressive type ([place]) Long Term Goal(s): Improvement in symptoms so as ready for discharge Improvement in symptoms so as ready for discharge   Short Term Goals: Ability to identify changes in lifestyle to reduce recurrence of condition will improve Ability to verbalize feelings will improve Ability to disclose and discuss suicidal ideas Ability to identify and develop effective coping behaviors will improve Compliance with prescribed medications will improve Ability to identify  triggers associated with substance abuse/mental health issues will improve  Medication Management: Evaluate patient's response, side effects, and tolerance of medication regimen.  Therapeutic Interventions: 1 to 1 sessions, Unit Group sessions and Medication administration.  Evaluation of Outcomes: Adequate for discharge   Physician Treatment Plan for Secondary Diagnosis: Principal Problem:   Schizoaffective disorder, depressive type ([place]) Active Problems:   MDD (major depressive disorder), recurrent severe, without psychosis ([place])  Long Term Goal(s): Improvement in symptoms so as ready for discharge Improvement in symptoms so as ready for discharge   Short Term Goals: Ability to identify changes in lifestyle to reduce recurrence of condition will improve Ability to verbalize feelings will improve Ability to disclose and discuss suicidal ideas Ability to identify and develop effective coping behaviors will improve Compliance with prescribed medications will improve Ability to identify triggers associated with substance abuse/mental health issues will improve     Medication Management: Evaluate patient's response, side effects, and tolerance of medication regimen.  Therapeutic Interventions: 1 to 1 sessions, Unit Group sessions and Medication administration.  Evaluation of Outcomes: Adequate for discharge   RN Treatment  Plan for Primary Diagnosis: Schizoaffective disorder, depressive type ([place]) Long Term Goal(s): Knowledge of disease and therapeutic regimen to maintain health will improve  Short Term Goals: Ability to verbalize frustration and anger appropriately will improve, Ability to disclose and discuss suicidal ideas and Ability to identify and develop effective coping behaviors will improve  Medication Management: RN will administer medications as ordered by provider, will assess and evaluate patient's response and provide education to patient for prescribed medication. RN will report any adverse and/or side effects to prescribing provider.  Therapeutic Interventions: 1 on 1 counseling sessions, Psychoeducation, Medication administration, Evaluate responses to treatment, Monitor vital signs and CBGs as ordered, Perform/monitor CIWA, COWS, AIMS and Fall Risk screenings as ordered, Perform wound care treatments as ordered.  Evaluation of Outcomes: Adequate for discharge  LCSW Treatment Plan for Primary Diagnosis: Schizoaffective disorder, depressive type ([place]) Long Term Goal(s): Safe transition to appropriate next level of care at discharge, Engage patient in therapeutic group addressing interpersonal concerns.  Short Term Goals: Engage patient in aftercare planning with referrals and resources, Increase emotional regulation, Identify triggers associated with mental health/substance abuse issues and Increase skills for wellness and recovery  Therapeutic Interventions: Assess for all discharge needs, 1 to 1 time with Social worker, Explore available resources and support systems, Assess for adequacy in community support network, Educate family and significant other(s) on suicide prevention, Complete Psychosocial Assessment, Interpersonal group therapy.  Evaluation of Outcomes: Adequate for discharge   Progress in Treatment: Attending groups: Yes Participating in groups: Yes, redirection required.  Taking  medication as prescribed: Yes. Toleration medication: Yes. Family/Significant other contact made: SPE completed with pt; pt declined to consent to collateral contact.  Patient understands diagnosis: Yes. Discussing patient identified problems/goals with staff: Yes. Medical problems stabilized or resolved: Yes. Denies suicidal/homicidal ideation: Yes. Issues/concerns per patient self-inventory: No. Other: n/a   New problem(s) identified: No, Describe:  n/a  New Short Term/Long Term Goal(s): medication management for mood stabilization; elimination of SI; development of comprehensive mental wellness/sobriety plan.   Discharge Plan or Barriers: FL2 and RSVP completed and CSW has actively been seeking out group home placement. 3 possible options. Pt not willing to stay in hospital to find out if they will accept him. He is planning to enter shelter at discharge and is now looking into low income housing/boarding. Pt given all resources that we have been working  on during his stay if he chooses to follow-up with them. Follow-up at [facility] in [place], [place]. CSW and pt explored options for tranpsort--pt has no family/friends that can pick him up. Letitia Libra [facility] approved taxi voucher. Pt must discharge at 11am in order to get to [employer] in [place], [place] and will enter the [facility] in the evening. Pt spoke with director of shelter-Melissa.   Reason for Continuation of Hospitalization: none  Estimated Length of Stay: Wed, 10/19/17 at 11am   Attendees: Patient: 10/19/2017 8:39 AM  Physician: Dr. Parke Poisson MD; Dr. Nancy Fetter MD 10/19/2017 8:39 AM  Nursing: Samul Dada RN 10/19/2017 8:39 AM  RN Care Manager:x 10/19/2017 8:39 AM  Social Worker: Maxie Better, LCSW 10/19/2017 8:39 AM  Recreational Therapist: x 10/19/2017 8:39 AM  Other: Lindell Spar NP; Darnelle Maffucci Money NP 10/19/2017 8:39 AM  Other:  10/19/2017 8:39 AM  Other: 10/19/2017 8:39 AM    Scribe for Treatment Team: [place],  LCSW 10/19/2017 8:39 AM

## 2017-10-22 ENCOUNTER — Encounter (HOSPITAL_COMMUNITY): Payer: Self-pay | Admitting: Cardiology

## 2017-10-22 ENCOUNTER — Emergency Department (HOSPITAL_COMMUNITY)
Admission: EM | Admit: 2017-10-22 | Discharge: 2017-10-23 | Disposition: A | Discharge status: Home / Self Care | Payer: Medicare Other | Attending: Emergency Medicine | Admitting: Emergency Medicine

## 2017-10-22 DIAGNOSIS — Z7984 Long term (current) use of oral hypoglycemic drugs: Secondary | ICD-10-CM | POA: Diagnosis not present

## 2017-10-22 DIAGNOSIS — E119 Type 2 diabetes mellitus without complications: Secondary | ICD-10-CM | POA: Diagnosis not present

## 2017-10-22 DIAGNOSIS — Z7982 Long term (current) use of aspirin: Secondary | ICD-10-CM | POA: Insufficient documentation

## 2017-10-22 DIAGNOSIS — F419 Anxiety disorder, unspecified: Secondary | ICD-10-CM | POA: Diagnosis not present

## 2017-10-22 DIAGNOSIS — F259 Schizoaffective disorder, unspecified: Secondary | ICD-10-CM | POA: Insufficient documentation

## 2017-10-22 DIAGNOSIS — I10 Essential (primary) hypertension: Secondary | ICD-10-CM | POA: Insufficient documentation

## 2017-10-22 DIAGNOSIS — Z79899 Other long term (current) drug therapy: Secondary | ICD-10-CM | POA: Insufficient documentation

## 2017-10-22 DIAGNOSIS — J449 Chronic obstructive pulmonary disease, unspecified: Secondary | ICD-10-CM | POA: Diagnosis not present

## 2017-10-22 DIAGNOSIS — B029 Zoster without complications: Secondary | ICD-10-CM | POA: Diagnosis not present

## 2017-10-22 DIAGNOSIS — R45851 Suicidal ideations: Secondary | ICD-10-CM | POA: Diagnosis not present

## 2017-10-22 DIAGNOSIS — F329 Major depressive disorder, single episode, unspecified: Secondary | ICD-10-CM | POA: Insufficient documentation

## 2017-10-22 DIAGNOSIS — Z590 Homelessness: Secondary | ICD-10-CM | POA: Diagnosis not present

## 2017-10-22 DIAGNOSIS — F29 Unspecified psychosis not due to a substance or known physiological condition: Secondary | ICD-10-CM | POA: Diagnosis not present

## 2017-10-22 DIAGNOSIS — R0602 Shortness of breath: Secondary | ICD-10-CM | POA: Diagnosis not present

## 2017-10-22 DIAGNOSIS — M79604 Pain in right leg: Secondary | ICD-10-CM | POA: Diagnosis not present

## 2017-10-22 DIAGNOSIS — M79605 Pain in left leg: Secondary | ICD-10-CM | POA: Diagnosis not present

## 2017-10-22 DIAGNOSIS — F17210 Nicotine dependence, cigarettes, uncomplicated: Secondary | ICD-10-CM | POA: Diagnosis not present

## 2017-10-22 DIAGNOSIS — Z7951 Long term (current) use of inhaled steroids: Secondary | ICD-10-CM | POA: Diagnosis not present

## 2017-10-22 DIAGNOSIS — Z794 Long term (current) use of insulin: Secondary | ICD-10-CM | POA: Diagnosis not present

## 2017-10-22 LAB — URINALYSIS, ROUTINE W REFLEX MICROSCOPIC
BACTERIA UA: NONE SEEN
Bilirubin Urine: NEGATIVE
Glucose, UA: >=500 mg/dL — AB
Hgb urine dipstick: NEGATIVE
KETONES UR: NEGATIVE mg/dL
LEUKOCYTES UA: NEGATIVE
Nitrite: NEGATIVE
Protein, ur: NEGATIVE mg/dL
SPECIFIC GRAVITY, URINE: 1.015 (ref 1.005–1.030)
SQUAMOUS EPITHELIAL / LPF: NONE SEEN
pH: 5 (ref 5.0–8.0)

## 2017-10-22 LAB — COMPREHENSIVE METABOLIC PANEL
ALBUMIN: 4.2 g/dL (ref 3.5–5.0)
ALK PHOS: 84 U/L (ref 38–126)
ALT: 24 U/L (ref 17–63)
ANION GAP: 10 (ref 5–15)
AST: 18 U/L (ref 15–41)
BUN: 16 mg/dL (ref 6–20)
CALCIUM: 9.4 mg/dL (ref 8.9–10.3)
CHLORIDE: 98 mmol/L — AB (ref 101–111)
CO2: 21 mmol/L — AB (ref 22–32)
Creatinine, Ser: 1.29 mg/dL — ABNORMAL HIGH (ref 0.61–1.24)
GFR calc non Af Amer: 60 mL/min — ABNORMAL LOW (ref >60)
Glucose, Bld: 431 mg/dL — ABNORMAL HIGH (ref 65–99)
POTASSIUM: 6 mmol/L — AB (ref 3.5–5.1)
SODIUM: 129 mmol/L — AB (ref 135–145)
Total Bilirubin: 1 mg/dL (ref 0.3–1.2)
Total Protein: 7.3 g/dL (ref 6.5–8.1)

## 2017-10-22 LAB — RAPID URINE DRUG SCREEN, HOSP PERFORMED
AMPHETAMINES: NOT DETECTED
BARBITURATES: NOT DETECTED
BENZODIAZEPINES: NOT DETECTED
COCAINE: POSITIVE — AB
Opiates: NOT DETECTED
Tetrahydrocannabinol: NOT DETECTED

## 2017-10-22 LAB — CBG MONITORING, ED
GLUCOSE-CAPILLARY: 469 mg/dL — AB (ref 65–99)
GLUCOSE-CAPILLARY: 483 mg/dL — AB (ref 65–99)
Glucose-Capillary: 359 mg/dL — ABNORMAL HIGH (ref 65–99)

## 2017-10-22 LAB — CBC WITH DIFFERENTIAL/PLATELET
BASOS PCT: 0 %
Basophils Absolute: 0 10*3/uL (ref 0.0–0.1)
EOS ABS: 0 10*3/uL (ref 0.0–0.7)
EOS PCT: 0 %
HCT: 45.1 % (ref 39.0–52.0)
Hemoglobin: 16.2 g/dL (ref 13.0–17.0)
Lymphocytes Relative: 4 %
Lymphs Abs: 0.4 10*3/uL — ABNORMAL LOW (ref 0.7–4.0)
MCH: 33.5 pg (ref 26.0–34.0)
MCHC: 35.9 g/dL (ref 30.0–36.0)
MCV: 93.4 fL (ref 78.0–100.0)
MONO ABS: 0.2 10*3/uL (ref 0.1–1.0)
MONOS PCT: 2 %
Neutro Abs: 9 10*3/uL — ABNORMAL HIGH (ref 1.7–7.7)
Neutrophils Relative %: 94 %
PLATELETS: 163 10*3/uL (ref 150–400)
RBC: 4.83 MIL/uL (ref 4.22–5.81)
RDW: 13.9 % (ref 11.5–15.5)
WBC: 9.7 10*3/uL (ref 4.0–10.5)

## 2017-10-22 LAB — POTASSIUM: POTASSIUM: 5.5 mmol/L — AB (ref 3.5–5.1)

## 2017-10-22 LAB — ETHANOL: Alcohol, Ethyl (B): <10 mg/dL (ref <10)

## 2017-10-22 MED ORDER — ACETAMINOPHEN 325 MG PO TABS
650.0000 mg | ORAL_TABLET | ORAL | Status: DC | PRN
  Administered 2017-10-23 (×2): 650 mg via ORAL
  Filled 2017-10-22 (×3): qty 2

## 2017-10-22 MED ORDER — LORAZEPAM 2 MG/ML IJ SOLN: 0.0000 mg | Freq: Two times a day (BID) | INTRAMUSCULAR | Status: DC

## 2017-10-22 MED ORDER — ACETAMINOPHEN 325 MG PO TABS
650.0000 mg | ORAL_TABLET | Freq: Once | ORAL | Status: AC
  Administered 2017-10-22: 650 mg via ORAL
  Filled 2017-10-22: qty 2

## 2017-10-22 MED ORDER — LORAZEPAM 2 MG/ML IJ SOLN: 0.0000 mg | Freq: Four times a day (QID) | INTRAMUSCULAR | Status: DC

## 2017-10-22 MED ORDER — INSULIN ASPART 100 UNIT/ML IV SOLN
15.0000 [IU] | Freq: Once | INTRAVENOUS | Status: AC
  Administered 2017-10-22: 15 [IU] via SUBCUTANEOUS

## 2017-10-22 MED ORDER — INSULIN ASPART 100 UNIT/ML ~~LOC~~ SOLN
10.0000 [IU] | Freq: Once | SUBCUTANEOUS | Status: AC
  Administered 2017-10-22: 10 [IU] via SUBCUTANEOUS
  Filled 2017-10-22: qty 1

## 2017-10-22 MED ORDER — INSULIN ASPART 100 UNIT/ML IV SOLN: 10.0000 [IU] | Freq: Once | INTRAVENOUS | Status: DC

## 2017-10-22 MED ORDER — LORAZEPAM 1 MG PO TABS: 0.0000 mg | ORAL_TABLET | Freq: Two times a day (BID) | ORAL | Status: DC

## 2017-10-22 MED ORDER — VITAMIN B-1 100 MG PO TABS
100.0000 mg | ORAL_TABLET | Freq: Every day | ORAL | Status: DC
  Administered 2017-10-22 – 2017-10-23 (×2): 100 mg via ORAL
  Filled 2017-10-22 (×2): qty 1

## 2017-10-22 MED ORDER — CLONAZEPAM 0.5 MG PO TABS
0.5000 mg | ORAL_TABLET | Freq: Once | ORAL | Status: AC
  Administered 2017-10-22: 0.5 mg via ORAL
  Filled 2017-10-22: qty 1

## 2017-10-22 MED ORDER — LORAZEPAM 1 MG PO TABS
0.0000 mg | ORAL_TABLET | Freq: Four times a day (QID) | ORAL | Status: DC
  Administered 2017-10-22: 1 mg via ORAL
  Administered 2017-10-23 (×2): 2 mg via ORAL
  Filled 2017-10-22: qty 2
  Filled 2017-10-22: qty 1
  Filled 2017-10-22: qty 2

## 2017-10-22 MED ORDER — THIAMINE HCL 100 MG/ML IJ SOLN: 100.0000 mg | Freq: Every day | INTRAMUSCULAR | Status: DC

## 2017-10-22 MED ORDER — INSULIN ASPART 100 UNIT/ML ~~LOC~~ SOLN
0.0000 [IU] | Freq: Three times a day (TID) | SUBCUTANEOUS | Status: DC
  Administered 2017-10-23: 8 [IU] via SUBCUTANEOUS
  Administered 2017-10-23: 5 [IU] via SUBCUTANEOUS
  Filled 2017-10-22 (×3): qty 1

## 2017-10-22 NOTE — BH Assessment (Addendum)
Tele Assessment Note   Patient Name: [name] MRN: [id number] Referring Physician: Julianne Rice. MD Location of Patient:  [name] Location of Provider: [place] is an 57 y.o. divorced male who presents unaccompanied to [facility] ED reporting pain in his feet and legs, shingles on his back and depressive symptoms including suicidal ideation. Pt has a history of depression and was discharged from [facility] on 10/19/17. Pt reports current suicidal ideation with plan to hang himself by a rope from a tree or overdose on pills. Pt says he has not acted on these plans because he doesn't have a rope or pills available. Pt reports he has attempted suicide "about twenty-five times." Pt acknowledges symptoms including social withdrawal, loss of interest in usual pleasures, fatigue, irritability, decreased concentration, decreased sleep and feelings of worthlessness and hopelessness. He denies current homicidal ideation or history of violence. He denies current auditory or visual hallucinations. Pt reports he uses cocaine infrequently and he last used two days ago. He denies any other recent substance use. Pt's urine drug screen is positive for cocaine.   Pt identifies several stressors. He states he is homeless and has no place to stay. He says his feet and legs hurt and scales his pain as 10/10. He describes missing his deceased mother and nephew. Pt says he has no family, friends or other social supports. He denies history of abuse or trauma. He denies current legal problems. Pt report he receives outpatient medication management through [facility]. He reports numerous inpatient psychiatric admission.  Pt is dressed in hospital scrubs, alert and oriented x4. Pt speaks in a clear tone, at moderate volume and normal pace. Motor behavior appears normal. Eye contact is good. Pt's mood is depressed and anxious; affect is congruent with mood. Thought process is coherent  and relevant. There is no indication Pt is currently responding to internal stimuli or experiencing delusional thought content. Pt was cooperative throughout assessment. Pt states he would like to be transferred to [facility].    Diagnosis: Major Depressive Disorder, Recurrent, Severe Without Psychotic Features; Cocaine Use Disorder  Past Medical History:  Past Medical History:  Diagnosis Date  . Alcohol dependence ([place]) 1979   stated abusing ETOH at age 46   . Back pain   . Benzodiazepine dependence ([place])   . Cardiac arrest ([place])   . Chronic back pain   . Cocaine abuse ([place])   . COPD (chronic obstructive pulmonary disease) ([place])   . Diabetes mellitus without complication ([place]) 5784  . Hypertension 2008  . MI (myocardial infarction) ([place])   . Non-compliance   . Schizoaffective disorder ([place]) 2006   . Substance abuse [facility])     Past Surgical History:  Procedure Laterality Date  . CYST EXCISION      Family History:  Family History  Problem Relation Age of Onset  . Heart disease Mother   . Heart disease Father   . Heart disease Maternal Grandmother   . Diabetes Maternal Grandmother     Social History:  reports that he has been smoking cigarettes.  He has been smoking about 1.50 packs per day. he has never used smokeless tobacco. He reports that he drinks alcohol. He reports that he uses drugs. Drug: Cocaine.  Additional Social History:  Alcohol / Drug Use Pain Medications: denies Prescriptions: denies Over the Counter: denies History of alcohol / drug use?: Yes Longest period of sobriety (when/how long): Unknown Negative Consequences of Use:  Personal relationships Substance #1 Name of Substance 1: Cocaine 1 - Age of First Use: Pt unsure 1 - Amount (size/oz): varies 1 - Frequency: "very rarely" 1 - Duration: On and off for years 1 - Last Use / Amount: 10/20/17  CIWA: CIWA-Ar BP: 118/64 Pulse Rate: 88 COWS:    PATIENT STRENGTHS: (choose at least two) Ability for  insight Average or above average intelligence Capable of independent living Communication skills General fund of knowledge  Allergies:  Allergies  Allergen Reactions  . Quetiapine Anaphylaxis  . Seroquel [Quetiapine Fumarate] Anxiety and Other (See Comments)    Reaction:  Nightmares   . Trazodone Anaphylaxis  . Trazodone And Nefazodone Anxiety and Other (See Comments)    Other reaction(s): Unknown Nightmares Reaction:  Nightmares   . Haldol [Haloperidol Lactate] Anxiety and Other (See Comments)    Reaction:  Nightmares     Home Medications:  (Not in a hospital admission)  OB/GYN Status:  No LMP for male patient.  General Assessment Data Location of Assessment: [facility] TTS Assessment: In system Is this a Tele or Face-to-Face Assessment?: Tele Assessment Is this an Initial Assessment or a Re-assessment for this encounter?: Initial Assessment Marital status: Divorced [place] name: NA Is patient pregnant?: No Pregnancy Status: No Living Arrangements: Other (Comment)(Homeless) Can pt return to current living arrangement?: Yes Admission Status: Voluntary Is patient capable of signing voluntary admission?: Yes Referral Source: Self/Family/Friend Insurance type: Medicare     Crisis Care Plan Living Arrangements: Other (Comment)(Homeless) Legal Guardian: Other:(Self) Name of Psychiatrist: [place] Name of Therapist: none  Education Status Is patient currently in school?: No Current Grade: NA Highest grade of school patient has completed: 8th Name of school: NA Contact person: NA  Risk to self with the past 6 months Suicidal Ideation: Yes-Currently Present Has patient been a risk to self within the past 6 months prior to admission? : Yes Suicidal Intent: Yes-Currently Present Has patient had any suicidal intent within the past 6 months prior to admission? : Yes Is patient at risk for suicide?: Yes Suicidal Plan?: Yes-Currently Present Has patient had any  suicidal plan within the past 6 months prior to admission? : Yes Specify Current Suicidal Plan: Plan to hang himself from a tree or overdose Access to Means: No Specify Access to Suicidal Means: Pt denies having a rope or pills What has been your use of drugs/alcohol within the last 12 months?: Pt reports he used cocaine two days ago Previous Attempts/Gestures: Yes How many times?: 25 Other Self Harm Risks: None identified Triggers for Past Attempts: Unpredictable Intentional Self Injurious Behavior: None Family Suicide History: No Recent stressful life event(s): Recent negative physical changes, Other (Comment)(Medical concerns, homeless) Persecutory voices/beliefs?: No Depression: Yes Depression Symptoms: Despondent, Insomnia, Fatigue, Loss of interest in usual pleasures, Feeling worthless/self pity, Feeling angry/irritable Substance abuse history and/or treatment for substance abuse?: Yes Suicide prevention information given to non-admitted patients: Not applicable  Risk to Others within the past 6 months Homicidal Ideation: No Does patient have any lifetime risk of violence toward others beyond the six months prior to admission? : No Thoughts of Harm to Others: No Comment - Thoughts of Harm to Others: Pt denies current thoughts of harming others Current Homicidal Intent: No Current Homicidal Plan: No Access to Homicidal Means: No Identified Victim: None History of harm to others?: No Assessment of Violence: None Noted Violent Behavior Description: Pt denies history of violence Does patient have access to weapons?: No Criminal Charges Pending?: No Does patient  have a court date: No Is patient on probation?: No  Psychosis Hallucinations: None noted Delusions: None noted  Mental Status Report Appearance/Hygiene: Disheveled, Poor hygiene Eye Contact: Good Motor Activity: Unremarkable Speech: Logical/coherent, Loud Level of Consciousness: Alert Mood: Depressed,  Anxious Affect: Depressed Anxiety Level: Moderate Thought Processes: Coherent, Relevant Judgement: Partial Orientation: Person, Place, Time, Situation, Appropriate for developmental age Obsessive Compulsive Thoughts/Behaviors: None  Cognitive Functioning Concentration: Normal Memory: Recent Intact, Remote Intact IQ: Average Insight: Poor Impulse Control: Fair Appetite: Good Weight Loss: 0 Weight Gain: 0 Sleep: Decreased Total Hours of Sleep: 3 Vegetative Symptoms: Decreased grooming  ADLScreening [facility] Assessment Services) Patient's cognitive ability adequate to safely complete daily activities?: Yes Patient able to express need for assistance with ADLs?: Yes Independently performs ADLs?: Yes (appropriate for developmental age)  Prior Inpatient Therapy Prior Inpatient Therapy: Yes Prior Therapy Dates: 10/2017, multiple admits Prior Therapy Facilty/Provider(s): [facility], other facilities Reason for Treatment: Depression  Prior Outpatient Therapy Prior Outpatient Therapy: Yes Prior Therapy Dates: ongoing Prior Therapy Facilty/Provider(s): [place] Reason for Treatment: depression Does patient have an ACCT team?: No Does patient have Intensive In-House Services?  : No Does patient have [facility] services? : No Does patient have P4CC services?: No  ADL Screening (condition at time of admission) Patient's cognitive ability adequate to safely complete daily activities?: Yes Is the patient deaf or have difficulty hearing?: No Does the patient have difficulty seeing, even when wearing glasses/contacts?: No Does the patient have difficulty concentrating, remembering, or making decisions?: No Patient able to express need for assistance with ADLs?: Yes Does the patient have difficulty dressing or bathing?: No Independently performs ADLs?: Yes (appropriate for developmental age) Does the patient have difficulty walking or climbing stairs?: No Weakness of Legs: Both Weakness  of Arms/Hands: None       Abuse/Neglect Assessment (Assessment to be complete while patient is alone) Physical Abuse: Denies Verbal Abuse: Denies Sexual Abuse: Denies Exploitation of patient/patient's resources: Denies Self-Neglect: Denies     Regulatory affairs officer (For Healthcare) Does Patient Have a Medical Advance Directive?: No Would patient like information on creating a medical advance directive?: No - Patient declined    Additional Information 1:1 In Past 12 Months?: No CIRT Risk: No Elopement Risk: No Does patient have medical clearance?: Yes     Disposition: Gave clinical report to Lindon Romp, NP who recommended Pt be observed overnight for safety and stabilization and evaluated by psychiatry in the morning. Notified Dr. Fredia Sorrow and Gearldine Shown, RN of recommendation.  Disposition Initial Assessment Completed for this Encounter: Yes Disposition of Patient: Re-evaluation by Psychiatry recommended  This service was provided via telemedicine using a 2-way, interactive audio and video technology.  Names of all persons participating in this telemedicine service and their role in this encounter. Name: Eliya Dimaria Role: Patient  Name: Storm Frisk, [facility] Role: TTS counselor         Orpah Greek Anson Fret, [facility], [facility], [facility] Triage Specialist [phone]  Evelena Peat 10/22/2017 9:57 PM

## 2017-10-22 NOTE — ED Notes (Signed)
Pt requesting more medication for anxiety. Informed patient that he was given medication less than an hour ago and no more medication could be given at this time per order.

## 2017-10-22 NOTE — ED Notes (Signed)
Received call from Diaz at [facility]. They recommend that patient be observed overnight and be re-evaluated by psychiatrist in the morning. EDP and charge RN notified.

## 2017-10-22 NOTE — ED Notes (Signed)
Pt constantly calling out requesting this RN to give medication for pain, anxiety, and to treat the "shingles on his back." EDP aware. No further orders given.

## 2017-10-22 NOTE — ED Provider Notes (Signed)
[facility] EMERGENCY DEPARTMENT Provider Note   CSN: [id number] Arrival date & time: 10/22/17  1554     History   Chief Complaint Chief Complaint  Patient presents with  . V70.1    HPI [name] is a 57 y.o. male.  HPI Patient brought in by EMS for suicidal ideation.  States he is having thoughts of harming himself by hanging.  States his been sad thinking about the loss of his mother.  He also sad due to being homeless.  He has not been taking his medications.  Was seen in the [facility] emergency department this morning but states he did not tell him that he was feeling suicidal.  Patient is requesting that his toenails be cut and something for anxiety. Past Medical History:  Diagnosis Date  . Alcohol dependence ([place]) 1979   stated abusing ETOH at age 66   . Back pain   . Benzodiazepine dependence ([place])   . Cardiac arrest ([place])   . Chronic back pain   . Cocaine abuse ([place])   . COPD (chronic obstructive pulmonary disease) ([place])   . Diabetes mellitus without complication ([place]) 0175  . Hypertension 2008  . MI (myocardial infarction) ([place])   . Non-compliance   . Schizoaffective disorder ([place]) 2006   . Substance abuse [facility])     Patient Active Problem List   Diagnosis Date Noted  . 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 Diabetic neuropathy ([place]) 11/11/2016  . Anxiety and depression 11/11/2016  . Chronic insomnia 01/08/2016  . Alcohol use disorder 12/10/2015  . Benzodiazepine abuse ([place]) 12/10/2015  . Opioid abuse ([place]) 12/10/2015  . Constipation 02/06/2015  . BPH (benign prostatic hyperplasia) 01/16/2015  . Vitamin D deficiency 01/16/2015  . Allergic rhinitis 11/19/2014  . Chronic low back pain 11/07/2014  . Onychomycosis of toenail 11/07/2014  . Tinea pedis 11/07/2014  . Psoriasis 10/14/2014  . HTN (hypertension) 10/14/2014  . Diabetes mellitus type 2, controlled ([place]) 10/14/2014  . COPD (chronic obstructive  pulmonary disease) ([place]) 10/14/2014  . Tobacco abuse 10/14/2014  . HLD (hyperlipidemia) 10/14/2014  . Schizoaffective disorder, depressive type ([place]) 10/14/2014    Past Surgical History:  Procedure Laterality Date  . CYST EXCISION         Home Medications    Prior to Admission medications   Medication Sig Start Date End Date Taking? Authorizing Provider  albuterol (PROVENTIL HFA;VENTOLIN HFA) 108 (90 Base) MCG/ACT inhaler Inhale 2 puffs into the lungs every 6 (six) hours as needed for wheezing or shortness of breath. 10/19/17  Yes Lindell Spar I, NP  aspirin EC 81 MG EC tablet Take 1 tablet (81 mg total) by mouth daily. For heart health 10/20/17  Yes Lindell Spar I, NP  aspirin EC 81 MG tablet Take 1 tablet (81 mg total) by mouth daily. For heart health 10/19/17  Yes Lindell Spar I, NP  atorvastatin (LIPITOR) 40 MG tablet TAKE 1 TABLET BY MOUTH EVERY DAY: For high Cholesterol 10/19/17  Yes Nwoko, Herbert Pun I, NP  clonazePAM (KLONOPIN) 0.5 MG tablet Take 1 tablet (0.5 mg total) by mouth 3 (three) times daily. 10/19/17  Yes Lindell Spar I, NP  fluticasone (FLONASE) 50 MCG/ACT nasal spray Place 1 spray into both nostrils daily. For allergies 10/20/17  Yes Lindell Spar I, NP  gabapentin (NEURONTIN) 400 MG capsule Take 1 capsule (400 mg total) by mouth 3 (three) times daily. For agitation/neuropathic pain 10/19/17  Yes Encarnacion Slates, NP  ibuprofen (  ADVIL,MOTRIN) 800 MG tablet Take 1 tablet (800 mg total) by mouth every 6 (six) hours as needed (moderate to severe pain). 10/19/17  Yes Nwoko, Herbert Pun I, NP  insulin aspart (NOVOLOG FLEXPEN) 100 UNIT/ML FlexPen Inject 10-15 Units into the skin as directed. For diabetese 10/19/17  Yes Nwoko, Herbert Pun I, NP  insulin NPH Human (HUMULIN N,NOVOLIN N) 100 UNIT/ML injection Inject 0.1 mLs (10 Units total) into the skin 2 (two) times daily at 8 am and 10 pm. 10/19/17  Yes Lindell Spar I, NP  isosorbide dinitrate (ISORDIL) 30 MG tablet Take 1 tablet (30 mg total) by mouth daily. For  chest pain 10/19/17  Yes Lindell Spar I, NP  lisinopril (PRINIVIL,ZESTRIL) 20 MG tablet Take 1 tablet (20 mg total) by mouth daily. For high blood pressure 10/20/17  Yes Nwoko, Agnes I, NP  metFORMIN (GLUCOPHAGE-XR) 500 MG 24 hr tablet Take 1 tablet (500 mg total) by mouth 2 (two) times daily. For diabetes 10/19/17  Yes Nwoko, Herbert Pun I, NP  nicotine (NICODERM CQ - DOSED IN MG/24 HOURS) 21 mg/24hr patch Place 1 patch (21 mg total) onto the skin daily. (May buy from over the counter at the pharmacy): For smoking cessation 10/20/17  Yes Nwoko, Herbert Pun I, NP  ondansetron (ZOFRAN-ODT) 4 MG disintegrating tablet Take 1 tablet (4 mg total) by mouth every 6 (six) hours as needed for nausea or vomiting. 10/19/17  Yes Lindell Spar I, NP  paliperidone (INVEGA SUSTENNA) 234 MG/1.5ML SUSP injection Inject 234 mg into the muscle every 30 (thirty) days. (Due on 11-13-17): For mood control 11/13/17  Yes Nwoko, Herbert Pun I, NP  pantoprazole (PROTONIX) 40 MG tablet Take 1 tablet (40 mg total) by mouth daily. For acid reflux 10/20/17  Yes Lindell Spar I, NP  tamsulosin (FLOMAX) 0.4 MG CAPS capsule Take 1 capsule (0.4 mg total) by mouth daily. For Prostate health 10/19/17  Yes Lindell Spar I, NP  zolpidem (AMBIEN) 10 MG tablet Take 1 tablet (10 mg total) by mouth at bedtime as needed for sleep. 10/19/17 11/18/17 Yes Encarnacion Slates, NP    Family History Family History  Problem Relation Age of Onset  . Heart disease Mother   . Heart disease Father   . Heart disease Maternal Grandmother   . Diabetes Maternal Grandmother     Social History Social History   Tobacco Use  . Smoking status: Current Every Day Smoker    Packs/day: 1.50    Types: Cigarettes  . Smokeless tobacco: Never Used  Substance Use Topics  . Alcohol use: Yes    Alcohol/week: 0.0 oz    Comment: states he drank some the other day   . Drug use: Yes    Types: Cocaine    Comment: 08/24/17     Allergies   Quetiapine; Seroquel [quetiapine fumarate]; Trazodone;  Trazodone and nefazodone; and Haldol [haloperidol lactate]   Review of Systems Review of Systems  Constitutional: Negative for chills and fever.  Respiratory: Negative for cough and shortness of breath.   Cardiovascular: Negative for chest pain.  Gastrointestinal: Negative for abdominal pain, nausea and vomiting.  Musculoskeletal: Negative for back pain, myalgias and neck pain.  Skin: Negative for rash and wound.  Neurological: Negative for dizziness, syncope, weakness, numbness and headaches.  Psychiatric/Behavioral: Positive for dysphoric mood and suicidal ideas. The patient is nervous/anxious.      Physical Exam Updated Vital Signs BP (!) 146/60 (BP Location: Right Arm)   Pulse 70   Temp 98 F (36.7 C) (Oral)  Resp 18   Ht 5\' 11"  (1.803 m)   Wt 97.5 kg (215 lb)   SpO2 97%   BMI 29.99 kg/m   Physical Exam  Constitutional: He is oriented to person, place, and time. He appears well-developed and well-nourished. No distress.  HENT:  Head: Normocephalic and atraumatic.  Mouth/Throat: Oropharynx is clear and moist.  Eyes: EOM are normal. Pupils are equal, round, and reactive to light.  Neck: Normal range of motion. Neck supple.  Cardiovascular: Normal rate and regular rhythm.  Pulmonary/Chest: Effort normal and breath sounds normal.  Abdominal: Soft. Bowel sounds are normal. There is no tenderness. There is no rebound and no guarding.  Musculoskeletal: Normal range of motion. He exhibits no edema or tenderness.  Neurological: He is alert and oriented to person, place, and time.  Moves all extremities without deficit.  Sensation intact.  No tremor present.  Skin: Skin is warm and dry. No rash noted. He is not diaphoretic. No erythema.  Psychiatric: He has a normal mood and affect. His behavior is normal.  Nursing note and vitals reviewed.    ED Treatments / Results  Labs (all labs ordered are listed, but only abnormal results are displayed) Labs Reviewed  CBC WITH  DIFFERENTIAL/PLATELET - Abnormal; Notable for the following components:      Result Value   Neutro Abs 9.0 (*)    Lymphs Abs 0.4 (*)    All other components within normal limits  COMPREHENSIVE METABOLIC PANEL - Abnormal; Notable for the following components:   Sodium 129 (*)    Potassium 6.0 (*)    Chloride 98 (*)    CO2 21 (*)    Glucose, Bld 431 (*)    Creatinine, Ser 1.29 (*)    GFR calc non Af Amer 60 (*)    All other components within normal limits  RAPID URINE DRUG SCREEN, HOSP PERFORMED - Abnormal; Notable for the following components:   Cocaine POSITIVE (*)    All other components within normal limits  URINALYSIS, ROUTINE W REFLEX MICROSCOPIC - Abnormal; Notable for the following components:   Glucose, UA >=500 (*)    All other components within normal limits  POTASSIUM - Abnormal; Notable for the following components:   Potassium 5.5 (*)    All other components within normal limits  CBG MONITORING, ED - Abnormal; Notable for the following components:   Glucose-Capillary 469 (*)    All other components within normal limits  CBG MONITORING, ED - Abnormal; Notable for the following components:   Glucose-Capillary 483 (*)    All other components within normal limits  CBG MONITORING, ED - Abnormal; Notable for the following components:   Glucose-Capillary 359 (*)    All other components within normal limits  CBG MONITORING, ED - Abnormal; Notable for the following components:   Glucose-Capillary 261 (*)    All other components within normal limits  CBG MONITORING, ED - Abnormal; Notable for the following components:   Glucose-Capillary 240 (*)    All other components within normal limits  ETHANOL    EKG  EKG Interpretation None       Radiology No results found.  Procedures Procedures (including critical care time)  Medications Ordered in ED Medications  insulin aspart (novoLOG) injection 10 Units (10 Units Subcutaneous Given 10/22/17 1752)  clonazePAM  (KLONOPIN) tablet 0.5 mg (0.5 mg Oral Given 10/22/17 2052)  acetaminophen (TYLENOL) tablet 650 mg (650 mg Oral Given 10/22/17 2052)  insulin aspart (novoLOG) injection 15 Units (15 Units  Subcutaneous Given 10/22/17 2052)     Initial Impression / Assessment and Plan / ED Course  I have reviewed the triage vital signs and the nursing notes.  Pertinent labs & imaging results that were available during my care of the patient were reviewed by me and considered in my medical decision making (see chart for details).     Placed on sliding scale insulin.  Medically cleared for psychiatric evaluation. Question malingering.  Final Clinical Impressions(s) / ED Diagnoses   Final diagnoses:  Suicidal ideation    ED Discharge Orders    None       Julianne Rice, MD 10/23/17 [phone]

## 2017-10-22 NOTE — ED Notes (Signed)
EDP at bedside updating patient. 

## 2017-10-22 NOTE — ED Triage Notes (Signed)
Pt called EMS for SI.  Pt homeless and states he misses his mom.  CBG 465.  Pt crying upon arrival to ED.

## 2017-10-23 DIAGNOSIS — R069 Unspecified abnormalities of breathing: Secondary | ICD-10-CM | POA: Diagnosis not present

## 2017-10-23 DIAGNOSIS — F329 Major depressive disorder, single episode, unspecified: Secondary | ICD-10-CM | POA: Diagnosis not present

## 2017-10-23 DIAGNOSIS — F419 Anxiety disorder, unspecified: Secondary | ICD-10-CM | POA: Diagnosis not present

## 2017-10-23 LAB — CBG MONITORING, ED
GLUCOSE-CAPILLARY: 240 mg/dL — AB (ref 65–99)
Glucose-Capillary: 261 mg/dL — ABNORMAL HIGH (ref 65–99)

## 2017-10-23 NOTE — ED Notes (Signed)
Given lunch tray.

## 2017-10-23 NOTE — ED Notes (Signed)
TTS in progress 

## 2017-10-23 NOTE — Discharge Instructions (Signed)
Follow-up with outpatient treatment as discussed by behavioral health

## 2017-10-23 NOTE — BHH Counselor (Signed)
Consulted with C. Withrow, NP.  Per NP, Pt may be discharged.

## 2017-11-02 ENCOUNTER — Encounter (HOSPITAL_COMMUNITY): Payer: Self-pay | Admitting: Emergency Medicine

## 2017-11-02 ENCOUNTER — Other Ambulatory Visit: Payer: Self-pay

## 2017-11-02 ENCOUNTER — Emergency Department (HOSPITAL_COMMUNITY)
Admission: EM | Admit: 2017-11-02 | Discharge: 2017-11-02 | Disposition: A | Discharge status: Home / Self Care | Payer: Medicare Other | Attending: Emergency Medicine | Admitting: Emergency Medicine

## 2017-11-02 DIAGNOSIS — F419 Anxiety disorder, unspecified: Secondary | ICD-10-CM | POA: Diagnosis not present

## 2017-11-02 DIAGNOSIS — Z5321 Procedure and treatment not carried out due to patient leaving prior to being seen by health care provider: Secondary | ICD-10-CM | POA: Insufficient documentation

## 2017-11-02 NOTE — ED Triage Notes (Signed)
Pt states he has been having panic attacks , but now feels better.

## 2017-11-02 NOTE — ED Notes (Signed)
Pt was never evaluated by EDP. Pt eloped.

## 2017-11-02 NOTE — ED Notes (Signed)
Pt states he is not having an anxiety attack anymore and wants to leave.  Pt encouraged to stay for medical evaluation.  Pt adamant, told [facility] ems driver that he just wanted to get to [place] so he could then get a ride to his [place] in [place]

## 2017-11-04 ENCOUNTER — Encounter (HOSPITAL_COMMUNITY): Payer: Self-pay | Admitting: Emergency Medicine

## 2017-11-04 ENCOUNTER — Other Ambulatory Visit: Payer: Self-pay

## 2017-11-04 ENCOUNTER — Emergency Department (HOSPITAL_COMMUNITY)
Admission: EM | Admit: 2017-11-04 | Discharge: 2017-11-04 | Disposition: A | Discharge status: Home / Self Care | Payer: Medicare Other | Attending: Emergency Medicine | Admitting: Emergency Medicine

## 2017-11-04 DIAGNOSIS — M25551 Pain in right hip: Secondary | ICD-10-CM | POA: Diagnosis not present

## 2017-11-04 DIAGNOSIS — M25552 Pain in left hip: Secondary | ICD-10-CM | POA: Diagnosis not present

## 2017-11-04 DIAGNOSIS — Z5321 Procedure and treatment not carried out due to patient leaving prior to being seen by health care provider: Secondary | ICD-10-CM | POA: Diagnosis not present

## 2017-11-04 DIAGNOSIS — M25559 Pain in unspecified hip: Secondary | ICD-10-CM | POA: Diagnosis not present

## 2017-11-04 DIAGNOSIS — R52 Pain, unspecified: Secondary | ICD-10-CM | POA: Diagnosis not present

## 2017-11-04 NOTE — ED Notes (Signed)
Pt states bilateral hip pain more on the right side. Denies injury, hx of bursitis. Pt say also issues w/ anxiety that is better at present. Pt ambulated to treatment room w/ no gait problems.

## 2017-11-04 NOTE — ED Triage Notes (Signed)
Patient brought in by EMS for bilat hip pain and anxiety, was seen here yesterday for the same.

## 2017-11-04 NOTE — ED Notes (Signed)
Spoke with caregiver from [facility] care and notified them pt signed out AMA at our facility and left walking. Pt states he was not going back to the assisted living facility.

## 2017-11-04 NOTE — ED Notes (Signed)
Pt given a drink at this time  

## 2017-11-04 NOTE — ED Notes (Signed)
Pt states he did not want to wait any longer. Was wanting hospital to provide ride. Pt said we was not going to treat him anyway. Pt left without being seen

## 2017-11-11 ENCOUNTER — Emergency Department (HOSPITAL_COMMUNITY)
Admission: EM | Admit: 2017-11-11 | Discharge: 2017-11-12 | Disposition: A | Discharge status: Other Acute Inpatient Hospital | Payer: Medicare Other | Attending: Psychiatry | Admitting: Psychiatry

## 2017-11-11 ENCOUNTER — Encounter (HOSPITAL_COMMUNITY): Payer: Self-pay | Admitting: Emergency Medicine

## 2017-11-11 ENCOUNTER — Other Ambulatory Visit: Payer: Self-pay

## 2017-11-11 DIAGNOSIS — F17210 Nicotine dependence, cigarettes, uncomplicated: Secondary | ICD-10-CM | POA: Insufficient documentation

## 2017-11-11 DIAGNOSIS — R45851 Suicidal ideations: Secondary | ICD-10-CM | POA: Insufficient documentation

## 2017-11-11 DIAGNOSIS — T50902A Poisoning by unspecified drugs, medicaments and biological substances, intentional self-harm, initial encounter: Secondary | ICD-10-CM

## 2017-11-11 DIAGNOSIS — E1140 Type 2 diabetes mellitus with diabetic neuropathy, unspecified: Secondary | ICD-10-CM | POA: Insufficient documentation

## 2017-11-11 DIAGNOSIS — T43592A Poisoning by other antipsychotics and neuroleptics, intentional self-harm, initial encounter: Secondary | ICD-10-CM | POA: Insufficient documentation

## 2017-11-11 DIAGNOSIS — I10 Essential (primary) hypertension: Secondary | ICD-10-CM | POA: Insufficient documentation

## 2017-11-11 DIAGNOSIS — Z046 Encounter for general psychiatric examination, requested by authority: Secondary | ICD-10-CM | POA: Diagnosis present

## 2017-11-11 DIAGNOSIS — Z79899 Other long term (current) drug therapy: Secondary | ICD-10-CM | POA: Diagnosis not present

## 2017-11-11 DIAGNOSIS — Z7982 Long term (current) use of aspirin: Secondary | ICD-10-CM | POA: Insufficient documentation

## 2017-11-11 DIAGNOSIS — J449 Chronic obstructive pulmonary disease, unspecified: Secondary | ICD-10-CM | POA: Insufficient documentation

## 2017-11-11 DIAGNOSIS — F332 Major depressive disorder, recurrent severe without psychotic features: Secondary | ICD-10-CM | POA: Insufficient documentation

## 2017-11-11 DIAGNOSIS — T50904A Poisoning by unspecified drugs, medicaments and biological substances, undetermined, initial encounter: Secondary | ICD-10-CM | POA: Diagnosis not present

## 2017-11-11 DIAGNOSIS — Z794 Long term (current) use of insulin: Secondary | ICD-10-CM | POA: Diagnosis not present

## 2017-11-11 LAB — COMPREHENSIVE METABOLIC PANEL
ALBUMIN: 4.2 g/dL (ref 3.5–5.0)
ALT: 17 U/L (ref 17–63)
AST: 14 U/L — AB (ref 15–41)
Alkaline Phosphatase: 68 U/L (ref 38–126)
Anion gap: 12 (ref 5–15)
BILIRUBIN TOTAL: 1 mg/dL (ref 0.3–1.2)
BUN: 12 mg/dL (ref 6–20)
CO2: 22 mmol/L (ref 22–32)
Calcium: 9.1 mg/dL (ref 8.9–10.3)
Chloride: 96 mmol/L — ABNORMAL LOW (ref 101–111)
Creatinine, Ser: 1.25 mg/dL — ABNORMAL HIGH (ref 0.61–1.24)
GFR calc Af Amer: >60 mL/min (ref >60)
GFR calc non Af Amer: >60 mL/min (ref >60)
GLUCOSE: 124 mg/dL — AB (ref 65–99)
POTASSIUM: 4.2 mmol/L (ref 3.5–5.1)
Sodium: 130 mmol/L — ABNORMAL LOW (ref 135–145)
TOTAL PROTEIN: 7.2 g/dL (ref 6.5–8.1)

## 2017-11-11 LAB — CBC
HEMATOCRIT: 40.9 % (ref 39.0–52.0)
HEMOGLOBIN: 14.3 g/dL (ref 13.0–17.0)
MCH: 33 pg (ref 26.0–34.0)
MCHC: 35 g/dL (ref 30.0–36.0)
MCV: 94.5 fL (ref 78.0–100.0)
Platelets: 152 10*3/uL (ref 150–400)
RBC: 4.33 MIL/uL (ref 4.22–5.81)
RDW: 13.8 % (ref 11.5–15.5)
WBC: 6.8 10*3/uL (ref 4.0–10.5)

## 2017-11-11 LAB — RAPID URINE DRUG SCREEN, HOSP PERFORMED
AMPHETAMINES: NOT DETECTED
BARBITURATES: NOT DETECTED
BENZODIAZEPINES: POSITIVE — AB
Cocaine: NOT DETECTED
Opiates: NOT DETECTED
TETRAHYDROCANNABINOL: NOT DETECTED

## 2017-11-11 LAB — CBG MONITORING, ED: GLUCOSE-CAPILLARY: 185 mg/dL — AB (ref 65–99)

## 2017-11-11 LAB — ACETAMINOPHEN LEVEL: Acetaminophen (Tylenol), Serum: <10 ug/mL — ABNORMAL LOW (ref 10–30)

## 2017-11-11 LAB — SALICYLATE LEVEL: Salicylate Lvl: <7 mg/dL (ref 2.8–30.0)

## 2017-11-11 LAB — ETHANOL: Alcohol, Ethyl (B): <10 mg/dL (ref <10)

## 2017-11-11 MED ORDER — ALBUTEROL SULFATE HFA 108 (90 BASE) MCG/ACT IN AERS: 2.0000 | INHALATION_SPRAY | Freq: Four times a day (QID) | RESPIRATORY_TRACT | Status: DC | PRN

## 2017-11-11 MED ORDER — NICOTINE 21 MG/24HR TD PT24
21.0000 mg | MEDICATED_PATCH | Freq: Every day | TRANSDERMAL | Status: DC
  Administered 2017-11-11 – 2017-11-12 (×2): 21 mg via TRANSDERMAL
  Filled 2017-11-11 (×2): qty 1

## 2017-11-11 MED ORDER — ONDANSETRON HCL 4 MG PO TABS: 4.0000 mg | ORAL_TABLET | Freq: Three times a day (TID) | ORAL | Status: DC | PRN

## 2017-11-11 MED ORDER — PANTOPRAZOLE SODIUM 40 MG PO TBEC
40.0000 mg | DELAYED_RELEASE_TABLET | Freq: Every day | ORAL | Status: DC
  Administered 2017-11-11 – 2017-11-12 (×2): 40 mg via ORAL
  Filled 2017-11-11 (×2): qty 1

## 2017-11-11 MED ORDER — LISINOPRIL 10 MG PO TABS
20.0000 mg | ORAL_TABLET | Freq: Every day | ORAL | Status: DC
  Administered 2017-11-12: 20 mg via ORAL
  Filled 2017-11-11 (×2): qty 2

## 2017-11-11 MED ORDER — ZOLPIDEM TARTRATE 5 MG PO TABS
5.0000 mg | ORAL_TABLET | Freq: Every evening | ORAL | Status: DC | PRN
  Administered 2017-11-12: 5 mg via ORAL
  Filled 2017-11-11: qty 1

## 2017-11-11 MED ORDER — ACETAMINOPHEN 325 MG PO TABS
650.0000 mg | ORAL_TABLET | ORAL | Status: DC | PRN
  Administered 2017-11-12 (×2): 650 mg via ORAL
  Filled 2017-11-11 (×2): qty 2

## 2017-11-11 MED ORDER — ATORVASTATIN CALCIUM 40 MG PO TABS
40.0000 mg | ORAL_TABLET | Freq: Every day | ORAL | Status: DC
  Filled 2017-11-11 (×2): qty 1

## 2017-11-11 MED ORDER — INSULIN NPH (HUMAN) (ISOPHANE) 100 UNIT/ML ~~LOC~~ SUSP
10.0000 [IU] | Freq: Two times a day (BID) | SUBCUTANEOUS | Status: DC
  Administered 2017-11-12: 10 [IU] via SUBCUTANEOUS
  Filled 2017-11-11 (×2): qty 10

## 2017-11-11 MED ORDER — METFORMIN HCL ER 500 MG PO TB24
500.0000 mg | ORAL_TABLET | Freq: Two times a day (BID) | ORAL | Status: DC
Start: 2017-11-11 — End: 2017-11-12
  Administered 2017-11-12: 500 mg via ORAL
  Filled 2017-11-11 (×5): qty 1

## 2017-11-11 MED ORDER — ASPIRIN EC 81 MG PO TBEC
81.0000 mg | DELAYED_RELEASE_TABLET | Freq: Every day | ORAL | Status: DC
  Administered 2017-11-11 – 2017-11-12 (×2): 81 mg via ORAL
  Filled 2017-11-11 (×2): qty 1

## 2017-11-11 MED ORDER — IBUPROFEN 800 MG PO TABS: 800.0000 mg | ORAL_TABLET | Freq: Four times a day (QID) | ORAL | Status: DC | PRN

## 2017-11-11 MED ORDER — CLONAZEPAM 0.5 MG PO TABS
0.5000 mg | ORAL_TABLET | Freq: Three times a day (TID) | ORAL | Status: DC
  Administered 2017-11-11 – 2017-11-12 (×3): 0.5 mg via ORAL
  Filled 2017-11-11 (×2): qty 1

## 2017-11-11 MED ORDER — GABAPENTIN 400 MG PO CAPS
400.0000 mg | ORAL_CAPSULE | Freq: Three times a day (TID) | ORAL | Status: DC
  Administered 2017-11-11 – 2017-11-12 (×2): 400 mg via ORAL
  Filled 2017-11-11 (×2): qty 1

## 2017-11-11 MED ORDER — INSULIN ASPART 100 UNIT/ML ~~LOC~~ SOLN: 10.0000 [IU] | SUBCUTANEOUS | Status: DC

## 2017-11-11 MED ORDER — ISOSORBIDE DINITRATE 10 MG PO TABS
30.0000 mg | ORAL_TABLET | Freq: Every day | ORAL | Status: DC
  Filled 2017-11-11 (×3): qty 3

## 2017-11-11 NOTE — ED Provider Notes (Addendum)
[facility] EMERGENCY DEPARTMENT Provider Note   CSN: [id number] Arrival date & time: 11/11/17  1919     History   Chief Complaint Chief Complaint  Patient presents with  . V70.1    HPI [name] is a 57 y.o. male.  Patient brought in by EMS.  Patient states he took an overdose of hydroxyzine to go to sleep and not wake up.  Patient is from [place] group home he does not like it there.  Does not go back patient states he is suicidal.  Patient's had multiple psychiatric evaluations in the past.  Patient seen on January 16 for anxiety.  Seen on January 5 for suicidal ideation.  Seen December 25 for suicidal ideation.  Patient states that he took approximately 7-10 pills at 1800.  Denies any nausea or vomiting.      Past Medical History:  Diagnosis Date  . Alcohol dependence ([place]) 1979   stated abusing ETOH at age 31   . Back pain   . Benzodiazepine dependence ([place])   . Cardiac arrest ([place])   . Chronic back pain   . Cocaine abuse ([place])   . COPD (chronic obstructive pulmonary disease) ([place])   . Diabetes mellitus without complication ([place]) 0350  . Hypertension 2008  . MI (myocardial infarction) ([place])   . Non-compliance   . Schizoaffective disorder ([place]) 2006   . Substance abuse [facility])     Patient Active Problem List   Diagnosis Date Noted  . MDD (major depressive disorder), recurrent severe, without psychosis ([place]) 10/12/2017  . Diabetic neuropathy ([place]) 11/11/2016  . Anxiety and depression 11/11/2016  . Chronic insomnia 01/08/2016  . Alcohol use disorder 12/10/2015  . Benzodiazepine abuse ([place]) 12/10/2015  . Opioid abuse ([place]) 12/10/2015  . Constipation 02/06/2015  . BPH (benign prostatic hyperplasia) 01/16/2015  . Vitamin D deficiency 01/16/2015  . Allergic rhinitis 11/19/2014  . Chronic low back pain 11/07/2014  . Onychomycosis of toenail 11/07/2014  . Tinea pedis 11/07/2014  . Psoriasis 10/14/2014  . HTN (hypertension) 10/14/2014  . Diabetes mellitus  type 2, controlled ([place]) 10/14/2014  . COPD (chronic obstructive pulmonary disease) ([place]) 10/14/2014  . Tobacco abuse 10/14/2014  . HLD (hyperlipidemia) 10/14/2014  . Schizoaffective disorder, depressive type ([place]) 10/14/2014    Past Surgical History:  Procedure Laterality Date  . CYST EXCISION         Home Medications    Prior to Admission medications   Medication Sig Start Date End Date Taking? Authorizing Provider  albuterol (PROVENTIL HFA;VENTOLIN HFA) 108 (90 Base) MCG/ACT inhaler Inhale 2 puffs into the lungs every 6 (six) hours as needed for wheezing or shortness of breath. 10/19/17   Lindell Spar I, NP  aspirin EC 81 MG EC tablet Take 1 tablet (81 mg total) by mouth daily. For heart health 10/20/17   Lindell Spar I, NP  aspirin EC 81 MG tablet Take 1 tablet (81 mg total) by mouth daily. For heart health Patient not taking: Reported on 11/02/2017 10/19/17   Lindell Spar I, NP  atorvastatin (LIPITOR) 40 MG tablet TAKE 1 TABLET BY MOUTH EVERY DAY: For high Cholesterol 10/19/17   Lindell Spar I, NP  clonazePAM (KLONOPIN) 0.5 MG tablet Take 1 tablet (0.5 mg total) by mouth 3 (three) times daily. 10/19/17   Lindell Spar I, NP  fluticasone (FLONASE) 50 MCG/ACT nasal spray Place 1 spray into both nostrils daily. For allergies 10/20/17   Lindell Spar I, NP  gabapentin (NEURONTIN) 400 MG capsule Take 1  capsule (400 mg total) by mouth 3 (three) times daily. For agitation/neuropathic pain 10/19/17   Lindell Spar I, NP  ibuprofen (ADVIL,MOTRIN) 800 MG tablet Take 1 tablet (800 mg total) by mouth every 6 (six) hours as needed (moderate to severe pain). 10/19/17   Lindell Spar I, NP  insulin aspart (NOVOLOG FLEXPEN) 100 UNIT/ML FlexPen Inject 10-15 Units into the skin as directed. For diabetese 10/19/17   Nwoko, Herbert Pun I, NP  insulin NPH Human (HUMULIN N,NOVOLIN N) 100 UNIT/ML injection Inject 0.1 mLs (10 Units total) into the skin 2 (two) times daily at 8 am and 10 pm. 10/19/17   Lindell Spar I, NP  isosorbide  dinitrate (ISORDIL) 30 MG tablet Take 1 tablet (30 mg total) by mouth daily. For chest pain 10/19/17   Lindell Spar I, NP  lisinopril (PRINIVIL,ZESTRIL) 20 MG tablet Take 1 tablet (20 mg total) by mouth daily. For high blood pressure 10/20/17   Nwoko, Herbert Pun I, NP  metFORMIN (GLUCOPHAGE-XR) 500 MG 24 hr tablet Take 1 tablet (500 mg total) by mouth 2 (two) times daily. For diabetes 10/19/17   Lindell Spar I, NP  nicotine (NICODERM CQ - DOSED IN MG/24 HOURS) 21 mg/24hr patch Place 1 patch (21 mg total) onto the skin daily. (May buy from over the counter at the pharmacy): For smoking cessation Patient not taking: Reported on 11/02/2017 10/20/17   Lindell Spar I, NP  ondansetron (ZOFRAN-ODT) 4 MG disintegrating tablet Take 1 tablet (4 mg total) by mouth every 6 (six) hours as needed for nausea or vomiting. 10/19/17   Lindell Spar I, NP  paliperidone (INVEGA SUSTENNA) 234 MG/1.5ML SUSP injection Inject 234 mg into the muscle every 30 (thirty) days. (Due on 11-13-17): For mood control 11/13/17   Lindell Spar I, NP  pantoprazole (PROTONIX) 40 MG tablet Take 1 tablet (40 mg total) by mouth daily. For acid reflux 10/20/17   Lindell Spar I, NP  tamsulosin (FLOMAX) 0.4 MG CAPS capsule Take 1 capsule (0.4 mg total) by mouth daily. For Prostate health 10/19/17   Lindell Spar I, NP  valACYclovir (VALTREX) 500 MG tablet Take 500 mg by mouth 3 (three) times daily. 10/22/17   [provider]  zolpidem (AMBIEN) 10 MG tablet Take 1 tablet (10 mg total) by mouth at bedtime as needed for sleep. 10/19/17 11/18/17  Encarnacion Slates, NP    Family History Family History  Problem Relation Age of Onset  . Heart disease Mother   . Heart disease Father   . Heart disease Maternal Grandmother   . Diabetes Maternal Grandmother     Social History Social History   Tobacco Use  . Smoking status: Current Every Day Smoker    Packs/day: 1.50    Types: Cigarettes  . Smokeless tobacco: Never Used  Substance Use Topics  . Alcohol use: Yes     Alcohol/week: 0.0 oz    Comment: states he drank some the other day   . Drug use: No    Comment: "not right now"     Allergies   Quetiapine; Seroquel [quetiapine fumarate]; Trazodone; Trazodone and nefazodone; and Haldol [haloperidol lactate]   Review of Systems Review of Systems  Constitutional: Negative for fever.  HENT: Negative for congestion.   Eyes: Negative for redness.  Respiratory: Negative for shortness of breath.   Cardiovascular: Negative for chest pain.  Gastrointestinal: Negative for abdominal pain, nausea and vomiting.  Genitourinary: Negative for dysuria.  Musculoskeletal: Negative for back pain.  Skin: Negative for rash.  Neurological:  Negative for headaches.  Hematological: Does not bruise/bleed easily.  Psychiatric/Behavioral: Positive for suicidal ideas. Negative for confusion.     Physical Exam Updated Vital Signs BP 122/67 (BP Location: Right Arm)   Pulse 66   Temp 98.4 F (36.9 C) (Oral)   Resp 18   Ht 1.803 m (5\' 11" )   Wt 97.5 kg (215 lb)   SpO2 99%   BMI 29.99 kg/m   Physical Exam  Constitutional: He is oriented to person, place, and time. He appears well-developed and well-nourished. No distress.  HENT:  Head: Normocephalic and atraumatic.  Mouth/Throat: Oropharynx is clear and moist.  Eyes: Conjunctivae and EOM are normal. Pupils are equal, round, and reactive to light.  Neck: Normal range of motion. Neck supple.  Cardiovascular: Normal rate, regular rhythm and normal heart sounds.  Pulmonary/Chest: Effort normal and breath sounds normal.  Abdominal: Soft. Bowel sounds are normal. There is no tenderness.  Musculoskeletal: Normal range of motion. He exhibits no edema.  Neurological: He is alert and oriented to person, place, and time. No cranial nerve deficit or sensory deficit. He exhibits normal muscle tone. Coordination normal.  Skin: Skin is warm.  Nursing note and vitals reviewed.    ED Treatments / Results  Labs (all labs  ordered are listed, but only abnormal results are displayed) Labs Reviewed  COMPREHENSIVE METABOLIC PANEL - Abnormal; Notable for the following components:      Result Value   Sodium 130 (*)    Chloride 96 (*)    Glucose, Bld 124 (*)    Creatinine, Ser 1.25 (*)    AST 14 (*)    All other components within normal limits  ACETAMINOPHEN LEVEL - Abnormal; Notable for the following components:   Acetaminophen (Tylenol), Serum <10 (*)    All other components within normal limits  RAPID URINE DRUG SCREEN, HOSP PERFORMED - Abnormal; Notable for the following components:   Benzodiazepines POSITIVE (*)    All other components within normal limits  ETHANOL  SALICYLATE LEVEL  CBC    EKG  EKG Interpretation  Date/Time:  Friday November 11 2017 20:35:35 EST Ventricular Rate:  67 PR Interval:  178 QRS Duration: 86 QT Interval:  418 QTC Calculation: 441 R Axis:   -16 Text Interpretation:  Normal sinus rhythm Normal ECG Artifact Confirmed by Fredia Sorrow [phone]) on 11/11/2017 8:40:38 PM       Radiology No results found.  Procedures Procedures (including critical care time)  Medications Ordered in ED Medications  acetaminophen (TYLENOL) tablet 650 mg (not administered)  zolpidem (AMBIEN) tablet 5 mg (not administered)  ondansetron (ZOFRAN) tablet 4 mg (not administered)  nicotine (NICODERM CQ - dosed in mg/24 hours) patch 21 mg (not administered)  albuterol (PROVENTIL HFA;VENTOLIN HFA) 108 (90 Base) MCG/ACT inhaler 2 puff (not administered)  aspirin EC tablet 81 mg (not administered)  atorvastatin (LIPITOR) tablet 40 mg (not administered)  clonazePAM (KLONOPIN) tablet 0.5 mg (not administered)  gabapentin (NEURONTIN) capsule 400 mg (not administered)  ibuprofen (ADVIL,MOTRIN) tablet 800 mg (not administered)  insulin aspart (NOVOLOG) FlexPen 10-15 Units (not administered)  insulin NPH Human (HUMULIN N,NOVOLIN N) injection 10 Units (not administered)  isosorbide dinitrate  (ISORDIL) tablet 30 mg (not administered)  lisinopril (PRINIVIL,ZESTRIL) tablet 20 mg (not administered)  metFORMIN (GLUCOPHAGE-XR) 24 hr tablet 500 mg (not administered)  pantoprazole (PROTONIX) EC tablet 40 mg (not administered)     Initial Impression / Assessment and Plan / ED Course  I have reviewed the triage vital signs and  the nursing notes.  Pertinent labs & imaging results that were available during my care of the patient were reviewed by me and considered in my medical decision making (see chart for details).     Patient alert slightly sleepy but easily arousable states that he is depressed and suicidal.  Patient medically cleared.  Labs without significant abnormalities.  Tylenol without elevation.  Urine drug screen only positive for benzos but he is on Klonopin.  Patient will require behavioral health evaluation.  Psych holding orders completed.  Patient's home meds reordered.  Disposition as per behavioral health.  Patient states he wants help.  That is suicidal.  Final Clinical Impressions(s) / ED Diagnoses   Final diagnoses:  Intentional drug overdose, initial encounter [facility])  Suicidal ideation    ED Discharge Orders    None       Fredia Sorrow, MD 11/11/17 2121    Fredia Sorrow, MD 11/11/17 2122

## 2017-11-11 NOTE — BH Assessment (Signed)
Tele Assessment Note   Patient Name: [name] MRN: [id number] Referring Physician: Fredia Sorrow, MD Location of Patient: [facility] Location of Provider: [place] is an 57 y.o. male who preset voluntarily to [place] alone. Pt took and overdose of Vistaril. Pt stated to this writer I just wanted to sleep , but I do wish I would have died". Pt reports he lives at [facility] and " I hate it. The residents are talking about me and against me". Pt reports he does have racing thoughts and wants someone to give him some Clozapine "to calm my thoughts down". Pt reports he has "thoughts just pop in my head to harm random people but I have no plan how and I wouldn't do it". Pt denies any AH/VH. Pt denies any legal issues or abuse. Pt reports he goes to [facility] but has not been in over a month. Reports he gets a shot every month to help his mental illness. Pt reports his mother dies a few months ago and the day after he lost his nephew. Pt reports he is "very depressed over loosing my mama, I think of her and just cry and cry".   Pt is dressed in hospital scrubs, alert and oriented x4. Pt has normal speech and normal motor behavior. Eye contact is good. Pt's mood is depressed and anxious; affect is congruent with mood. Thought process is coherent and relevant. There is no indication Pt is currently responding to internal stimuli or experiencing delusional thought content. Pt was cooperative throughout assessment.   Diagnosis F33.2 Major depressive disorder, Recurrent episode, Severe   Past Medical History:  Past Medical History:  Diagnosis Date  . Alcohol dependence ([place]) 1979   stated abusing ETOH at age 49   . Back pain   . Benzodiazepine dependence ([place])   . Cardiac arrest ([place])   . Chronic back pain   . Cocaine abuse ([place])   . COPD (chronic obstructive pulmonary disease) ([place])   . Diabetes mellitus without complication ([place])  3299  . Hypertension 2008  . MI (myocardial infarction) ([place])   . Non-compliance   . Schizoaffective disorder ([place]) 2006   . Substance abuse [facility])     Past Surgical History:  Procedure Laterality Date  . CYST EXCISION      Family History:  Family History  Problem Relation Age of Onset  . Heart disease Mother   . Heart disease Father   . Heart disease Maternal Grandmother   . Diabetes Maternal Grandmother     Social History:  reports that he has been smoking cigarettes.  He has been smoking about 1.50 packs per day. he has never used smokeless tobacco. He reports that he drinks alcohol. He reports that he does not use drugs.  Additional Social History:  Alcohol / Drug Use Pain Medications: See MAR Prescriptions: See MAR Over the Counter: See MAR History of alcohol / drug use?: Yes Longest period of sobriety (when/how long): Unknown Negative Consequences of Use: Personal relationships Substance #1 Name of Substance 1: Cocaine 1 - Age of First Use: Pt unsure 1 - Amount (size/oz): varies 1 - Frequency: "very rarely" 1 - Duration: On and off for years 1 - Last Use / Amount: Unsure  CIWA: CIWA-Ar BP: 122/67 Pulse Rate: 66 COWS:    Allergies:  Allergies  Allergen Reactions  . Quetiapine Anaphylaxis  . Seroquel [Quetiapine Fumarate] Anxiety and Other (See Comments)    Reaction:  Nightmares   . Trazodone Anaphylaxis  . Trazodone And Nefazodone Anxiety and Other (See Comments)    Other reaction(s): Unknown Nightmares Reaction:  Nightmares   . Haldol [Haloperidol Lactate] Anxiety and Other (See Comments)    Reaction:  Nightmares     Home Medications:  (Not in a hospital admission)  OB/GYN Status:  No LMP for male patient.  General Assessment Data Location of Assessment: [facility] TTS Assessment: In system Is this a Tele or Face-to-Face Assessment?: Tele Assessment Is this an Initial Assessment or a Re-assessment for this encounter?: Initial Assessment Marital  status: Divorced Is patient pregnant?: No Pregnancy Status: No Living Arrangements: Other (Comment)([facility] [place]) Can pt return to current living arrangement?: Yes Admission Status: Voluntary Is patient capable of signing voluntary admission?: Yes Referral Source: Self/Family/Friend Insurance type: Medicare  Medical Screening Exam ([place]) Medical Exam completed: Yes  Crisis Care Plan Living Arrangements: Other (Comment)([facility] [place]) Name of Psychiatrist: [place] Name of Therapist: none  Education Status Is patient currently in school?: No Highest grade of school patient has completed: 8th  Risk to self with the past 6 months Suicidal Ideation: Yes-Currently Present(Pt states he didn't mean to overdose but does wish he would ) Has patient been a risk to self within the past 6 months prior to admission? : Yes Suicidal Intent: Yes-Currently Present Has patient had any suicidal intent within the past 6 months prior to admission? : Yes Is patient at risk for suicide?: Yes Suicidal Plan?: Yes-Currently Present Has patient had any suicidal plan within the past 6 months prior to admission? : Yes Specify Current Suicidal Plan: Overdose Access to Means: Yes Specify Access to Suicidal Means: Medications What has been your use of drugs/alcohol within the last 12 months?: Cocaine and Vistaril Previous Attempts/Gestures: Yes How many times?: 26 Other Self Harm Risks: None Triggers for Past Attempts: Unpredictable Intentional Self Injurious Behavior: None Family Suicide History: No Recent stressful life event(s): Conflict (Comment), Loss (Comment), Turmoil (Comment)(Lost mother and nephew a few months ago, hates where he is l) Persecutory voices/beliefs?: No Depression: Yes Depression Symptoms: Despondent, Insomnia, Tearfulness, Isolating, Fatigue, Loss of interest in usual pleasures, Feeling worthless/self pity, Feeling  angry/irritable Substance abuse history and/or treatment for substance abuse?: Yes Suicide prevention information given to non-admitted patients: Not applicable  Risk to Others within the past 6 months Homicidal Ideation: Yes-Currently Present Does patient have any lifetime risk of violence toward others beyond the six months prior to admission? : No Thoughts of Harm to Others: Yes-Currently Present Comment - Thoughts of Harm to Others: States he just has thoughts of hurting people Current Homicidal Intent: No Current Homicidal Plan: No Access to Homicidal Means: No Identified Victim: Random people History of harm to others?: No Assessment of Violence: None Noted Violent Behavior Description: None Does patient have access to weapons?: No Criminal Charges Pending?: No Does patient have a court date: No Is patient on probation?: No  Psychosis Hallucinations: None noted Delusions: None noted  Mental Status Report Appearance/Hygiene: Disheveled, Poor hygiene Eye Contact: Fair Motor Activity: Unremarkable Speech: Logical/coherent Level of Consciousness: Drowsy Mood: Depressed, Anxious, Helpless, Despair Affect: Depressed, Appropriate to circumstance Anxiety Level: Moderate Thought Processes: Coherent, Relevant Judgement: Impaired Orientation: Person, Place, Time, Situation, Appropriate for developmental age Obsessive Compulsive Thoughts/Behaviors: None  Cognitive Functioning Concentration: Normal Memory: Recent Intact IQ: Average Insight: Poor Impulse Control: Poor Appetite: Good Sleep: Decreased Total Hours of Sleep: 3 Vegetative Symptoms: None  ADLScreening [facility] Assessment Services)  Patient's cognitive ability adequate to safely complete daily activities?: Yes Patient able to express need for assistance with ADLs?: Yes Independently performs ADLs?: Yes (appropriate for developmental age)  Prior Inpatient Therapy Prior Inpatient Therapy: Yes Prior Therapy Dates:  10/2017, multiple admits Prior Therapy Facilty/Provider(s): [facility] [place], other facilities Reason for Treatment: Depression  Prior Outpatient Therapy Prior Outpatient Therapy: Yes Prior Therapy Dates: ongoing Prior Therapy Facilty/Provider(s): [place] Reason for Treatment: depression Does patient have an ACCT team?: No Does patient have Intensive In-House Services?  : No Does patient have [facility] services? : No Does patient have P4CC services?: No  ADL Screening (condition at time of admission) Patient's cognitive ability adequate to safely complete daily activities?: Yes Patient able to express need for assistance with ADLs?: Yes Independently performs ADLs?: Yes (appropriate for developmental age)       Abuse/Neglect Assessment (Assessment to be complete while patient is alone) Sexual Abuse: Denies Exploitation of patient/patient's resources: Denies Self-Neglect: Denies Values / Beliefs Cultural Requests During Hospitalization: None Spiritual Requests During Hospitalization: None   Advance Directives (For Healthcare) Does Patient Have a Medical Advance Directive?: No Would patient like information on creating a medical advance directive?: No - Patient declined    Additional Information 1:1 In Past 12 Months?: No CIRT Risk: No Elopement Risk: No Does patient have medical clearance?: Yes     Disposition:  Disposition Initial Assessment Completed for this Encounter: Yes Disposition of Patient: Inpatient treatment program Type of inpatient treatment program: Adult   Lindon Romp, NP recommends inpatient hospitalization.   This service was provided via telemedicine using a 2-way, interactive audio and video technology.  Names of all persons participating in this telemedicine service and their role in this encounter. Name: Arnaldo Cutler Role: Pt  Name: Steffanie Rainwater, [place], [place] Role: Therapeutic Triage Specialist  Name:  Role:  Name:  Role:     Steffanie Rainwater, [place],  LPCA 11/11/2017 9:54 PM
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
## 2017-11-11 NOTE — ED Notes (Signed)
Pt states he was not trying to kill his self by taking the vistaril but wishes he would have died

## 2017-11-12 ENCOUNTER — Encounter (HOSPITAL_COMMUNITY): Payer: Self-pay

## 2017-11-12 ENCOUNTER — Inpatient Hospital Stay (HOSPITAL_COMMUNITY)
Admission: AD | Admit: 2017-11-12 | Discharge: 2017-11-15 | DRG: 885 | Disposition: A | Discharge status: Home / Self Care | Payer: Medicare Other | Source: Intra-hospital | Attending: Psychiatry | Admitting: Psychiatry

## 2017-11-12 ENCOUNTER — Other Ambulatory Visit: Payer: Self-pay

## 2017-11-12 DIAGNOSIS — R45851 Suicidal ideations: Secondary | ICD-10-CM | POA: Diagnosis not present

## 2017-11-12 DIAGNOSIS — Z91411 Personal history of adult psychological abuse: Secondary | ICD-10-CM | POA: Diagnosis not present

## 2017-11-12 DIAGNOSIS — Z7982 Long term (current) use of aspirin: Secondary | ICD-10-CM | POA: Diagnosis not present

## 2017-11-12 DIAGNOSIS — J449 Chronic obstructive pulmonary disease, unspecified: Secondary | ICD-10-CM | POA: Diagnosis present

## 2017-11-12 DIAGNOSIS — G4700 Insomnia, unspecified: Secondary | ICD-10-CM | POA: Diagnosis present

## 2017-11-12 DIAGNOSIS — F419 Anxiety disorder, unspecified: Secondary | ICD-10-CM | POA: Diagnosis not present

## 2017-11-12 DIAGNOSIS — Z8674 Personal history of sudden cardiac arrest: Secondary | ICD-10-CM | POA: Diagnosis not present

## 2017-11-12 DIAGNOSIS — I252 Old myocardial infarction: Secondary | ICD-10-CM

## 2017-11-12 DIAGNOSIS — Z794 Long term (current) use of insulin: Secondary | ICD-10-CM

## 2017-11-12 DIAGNOSIS — Z825 Family history of asthma and other chronic lower respiratory diseases: Secondary | ICD-10-CM | POA: Diagnosis not present

## 2017-11-12 DIAGNOSIS — E119 Type 2 diabetes mellitus without complications: Secondary | ICD-10-CM | POA: Diagnosis not present

## 2017-11-12 DIAGNOSIS — E785 Hyperlipidemia, unspecified: Secondary | ICD-10-CM | POA: Diagnosis present

## 2017-11-12 DIAGNOSIS — E1140 Type 2 diabetes mellitus with diabetic neuropathy, unspecified: Secondary | ICD-10-CM | POA: Diagnosis present

## 2017-11-12 DIAGNOSIS — R440 Auditory hallucinations: Secondary | ICD-10-CM | POA: Diagnosis not present

## 2017-11-12 DIAGNOSIS — F339 Major depressive disorder, recurrent, unspecified: Principal | ICD-10-CM | POA: Diagnosis present

## 2017-11-12 DIAGNOSIS — T43591A Poisoning by other antipsychotics and neuroleptics, accidental (unintentional), initial encounter: Secondary | ICD-10-CM | POA: Diagnosis not present

## 2017-11-12 DIAGNOSIS — F333 Major depressive disorder, recurrent, severe with psychotic symptoms: Secondary | ICD-10-CM | POA: Diagnosis not present

## 2017-11-12 DIAGNOSIS — Z915 Personal history of self-harm: Secondary | ICD-10-CM

## 2017-11-12 DIAGNOSIS — Z833 Family history of diabetes mellitus: Secondary | ICD-10-CM | POA: Diagnosis not present

## 2017-11-12 DIAGNOSIS — Z8249 Family history of ischemic heart disease and other diseases of the circulatory system: Secondary | ICD-10-CM | POA: Diagnosis not present

## 2017-11-12 DIAGNOSIS — F251 Schizoaffective disorder, depressive type: Secondary | ICD-10-CM | POA: Diagnosis not present

## 2017-11-12 DIAGNOSIS — M549 Dorsalgia, unspecified: Secondary | ICD-10-CM | POA: Diagnosis not present

## 2017-11-12 DIAGNOSIS — I10 Essential (primary) hypertension: Secondary | ICD-10-CM | POA: Diagnosis not present

## 2017-11-12 DIAGNOSIS — J432 Centrilobular emphysema: Secondary | ICD-10-CM

## 2017-11-12 DIAGNOSIS — T43592A Poisoning by other antipsychotics and neuroleptics, intentional self-harm, initial encounter: Secondary | ICD-10-CM | POA: Diagnosis not present

## 2017-11-12 DIAGNOSIS — F17210 Nicotine dependence, cigarettes, uncomplicated: Secondary | ICD-10-CM | POA: Diagnosis present

## 2017-11-12 DIAGNOSIS — F329 Major depressive disorder, single episode, unspecified: Secondary | ICD-10-CM | POA: Diagnosis present

## 2017-11-12 DIAGNOSIS — F332 Major depressive disorder, recurrent severe without psychotic features: Secondary | ICD-10-CM | POA: Diagnosis not present

## 2017-11-12 LAB — GLUCOSE, CAPILLARY: GLUCOSE-CAPILLARY: 182 mg/dL — AB (ref 65–99)

## 2017-11-12 LAB — CBG MONITORING, ED
Glucose-Capillary: 159 mg/dL — ABNORMAL HIGH (ref 65–99)
Glucose-Capillary: 167 mg/dL — ABNORMAL HIGH (ref 65–99)

## 2017-11-12 MED ORDER — METFORMIN HCL ER 500 MG PO TB24
500.0000 mg | ORAL_TABLET | Freq: Two times a day (BID) | ORAL | Status: DC
  Administered 2017-11-12 – 2017-11-15 (×6): 500 mg via ORAL
  Filled 2017-11-12 (×11): qty 1

## 2017-11-12 MED ORDER — ZOLPIDEM TARTRATE 5 MG PO TABS
5.0000 mg | ORAL_TABLET | Freq: Every evening | ORAL | Status: DC | PRN
  Administered 2017-11-12 – 2017-11-14 (×3): 5 mg via ORAL
  Filled 2017-11-12 (×3): qty 1

## 2017-11-12 MED ORDER — ASPIRIN EC 81 MG PO TBEC
81.0000 mg | DELAYED_RELEASE_TABLET | Freq: Every day | ORAL | Status: DC
  Administered 2017-11-13 – 2017-11-15 (×3): 81 mg via ORAL
  Filled 2017-11-12 (×5): qty 1

## 2017-11-12 MED ORDER — ISOSORBIDE DINITRATE 30 MG PO TABS
30.0000 mg | ORAL_TABLET | Freq: Every day | ORAL | Status: DC
  Administered 2017-11-13 – 2017-11-15 (×3): 30 mg via ORAL
  Filled 2017-11-12: qty 3
  Filled 2017-11-12 (×4): qty 1

## 2017-11-12 MED ORDER — ATORVASTATIN CALCIUM 40 MG PO TABS
40.0000 mg | ORAL_TABLET | Freq: Every day | ORAL | Status: DC
  Administered 2017-11-12 – 2017-11-14 (×3): 40 mg via ORAL
  Filled 2017-11-12 (×6): qty 1

## 2017-11-12 MED ORDER — ACETAMINOPHEN 325 MG PO TABS
650.0000 mg | ORAL_TABLET | ORAL | Status: DC | PRN
  Administered 2017-11-12 – 2017-11-14 (×6): 650 mg via ORAL
  Filled 2017-11-12 (×7): qty 2

## 2017-11-12 MED ORDER — GUAIFENESIN ER 600 MG PO TB12
1200.0000 mg | ORAL_TABLET | Freq: Two times a day (BID) | ORAL | Status: DC
  Administered 2017-11-12 – 2017-11-15 (×6): 1200 mg via ORAL
  Filled 2017-11-12 (×10): qty 2

## 2017-11-12 MED ORDER — MUPIROCIN 2 % EX OINT
TOPICAL_OINTMENT | Freq: Two times a day (BID) | CUTANEOUS | Status: DC
  Administered 2017-11-13 – 2017-11-15 (×5): via TOPICAL
  Filled 2017-11-12 (×3): qty 22

## 2017-11-12 MED ORDER — INSULIN ASPART 100 UNIT/ML ~~LOC~~ SOLN
0.0000 [IU] | Freq: Three times a day (TID) | SUBCUTANEOUS | Status: DC
Start: 2017-11-12 — End: 2017-11-12
  Administered 2017-11-12: 4 [IU] via SUBCUTANEOUS
  Filled 2017-11-12: qty 1

## 2017-11-12 MED ORDER — CLONAZEPAM 0.5 MG PO TABS
0.5000 mg | ORAL_TABLET | Freq: Three times a day (TID) | ORAL | Status: DC
  Administered 2017-11-12 – 2017-11-15 (×8): 0.5 mg via ORAL
  Filled 2017-11-12 (×9): qty 1

## 2017-11-12 MED ORDER — BENZONATATE 100 MG PO CAPS: 100.0000 mg | ORAL_CAPSULE | Freq: Three times a day (TID) | ORAL | Status: DC | PRN

## 2017-11-12 MED ORDER — NICOTINE 21 MG/24HR TD PT24
21.0000 mg | MEDICATED_PATCH | Freq: Every day | TRANSDERMAL | Status: DC
  Administered 2017-11-13 – 2017-11-15 (×3): 21 mg via TRANSDERMAL
  Filled 2017-11-12 (×5): qty 1

## 2017-11-12 MED ORDER — ALBUTEROL SULFATE HFA 108 (90 BASE) MCG/ACT IN AERS: 2.0000 | INHALATION_SPRAY | Freq: Four times a day (QID) | RESPIRATORY_TRACT | Status: DC | PRN

## 2017-11-12 MED ORDER — GUAIFENESIN ER 600 MG PO TB12
1200.0000 mg | ORAL_TABLET | Freq: Two times a day (BID) | ORAL | Status: DC
  Administered 2017-11-12: 1200 mg via ORAL
  Filled 2017-11-12 (×6): qty 2

## 2017-11-12 MED ORDER — ONDANSETRON HCL 4 MG PO TABS: 4.0000 mg | ORAL_TABLET | Freq: Three times a day (TID) | ORAL | Status: DC | PRN

## 2017-11-12 MED ORDER — METFORMIN HCL 500 MG PO TABS
ORAL_TABLET | ORAL | Status: AC
  Administered 2017-11-12: 500 mg
  Filled 2017-11-12: qty 1

## 2017-11-12 MED ORDER — LISINOPRIL 20 MG PO TABS
20.0000 mg | ORAL_TABLET | Freq: Every day | ORAL | Status: DC
  Administered 2017-11-13 – 2017-11-15 (×3): 20 mg via ORAL
  Filled 2017-11-12 (×6): qty 1

## 2017-11-12 MED ORDER — GABAPENTIN 400 MG PO CAPS
400.0000 mg | ORAL_CAPSULE | Freq: Three times a day (TID) | ORAL | Status: DC
  Administered 2017-11-12 – 2017-11-13 (×2): 400 mg via ORAL
  Filled 2017-11-12 (×10): qty 1

## 2017-11-12 MED ORDER — BENZONATATE 100 MG PO CAPS
100.0000 mg | ORAL_CAPSULE | Freq: Three times a day (TID) | ORAL | Status: DC | PRN
  Administered 2017-11-12: 100 mg via ORAL
  Filled 2017-11-12: qty 1

## 2017-11-12 MED ORDER — INSULIN NPH (HUMAN) (ISOPHANE) 100 UNIT/ML ~~LOC~~ SUSP
10.0000 [IU] | Freq: Two times a day (BID) | SUBCUTANEOUS | Status: DC
  Administered 2017-11-12 – 2017-11-14 (×3): 10 [IU] via SUBCUTANEOUS

## 2017-11-12 MED ORDER — IBUPROFEN 800 MG PO TABS
800.0000 mg | ORAL_TABLET | Freq: Four times a day (QID) | ORAL | Status: DC | PRN
  Administered 2017-11-12 – 2017-11-15 (×6): 800 mg via ORAL
  Filled 2017-11-12 (×6): qty 1

## 2017-11-12 MED ORDER — PANTOPRAZOLE SODIUM 40 MG PO TBEC
40.0000 mg | DELAYED_RELEASE_TABLET | Freq: Every day | ORAL | Status: DC
  Administered 2017-11-13 – 2017-11-15 (×3): 40 mg via ORAL
  Filled 2017-11-12 (×5): qty 1

## 2017-11-12 NOTE — ED Provider Notes (Signed)
Pt has been accepted to [facility]. Will transfer stable.    Francine Graven, DO 11/12/17 1124

## 2017-11-12 NOTE — Progress Notes (Addendum)
Per Doree Albee, pt has been accepted to [facility] bed 304/01. Accepting provider is Otila Kluver NP, Attending provider is Dr. Parke Poisson MD. Patient can arrive by 1:00PM. Number for report is [phone]. [name] Charge RN notified of above.    Maxie Better, MSW, LCSW Clinical Social Worker 11/12/2017 9:19 AM

## 2017-11-12 NOTE — ED Notes (Signed)
Patient is sleeping at this time. Patient snoring. Patient has equal rise and fall of chest. Equal respirations. No distress noted.

## 2017-11-12 NOTE — Tx Team (Signed)
Initial Treatment Plan 11/12/2017 4:55 PM [name] HRC:[id number]    PATIENT STRESSORS: Health problems Marital or family conflict Medication change or noncompliance   PATIENT STRENGTHS: General fund of knowledge Motivation for treatment/growth   PATIENT IDENTIFIED PROBLEMS: Suicidal ideation  Homicidal ideation  Paranoia  "Feel better"  "Find a different place to go"             DISCHARGE CRITERIA:  Improved stabilization in mood, thinking, and/or behavior Verbal commitment to aftercare and medication compliance  PRELIMINARY DISCHARGE PLAN: Outpatient therapy Medication management  PATIENT/FAMILY INVOLVEMENT: This treatment plan has been presented to and reviewed with the patient, [name].  The patient and family have been given the opportunity to ask questions and make suggestions.  Windell Moment, RN 11/12/2017, 4:55 PM

## 2017-11-12 NOTE — Progress Notes (Signed)
Did not attend group 
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
## 2017-11-12 NOTE — ED Notes (Signed)
Patient voided using the urinal in room. Patient's vitals updated and are within normal limits.

## 2017-11-12 NOTE — Progress Notes (Signed)
Patient ID: [name], male   DOB: 1961/04/17, 57 y.o.   MRN: [id number] Per State regulations 482.30 this chart was reviewed for medical necessity with respect to the patient's admission/duration of stay.    Next review date: 11/16/17  Debarah Crape, BSN, RN-BC  Case Manager

## 2017-11-12 NOTE — Progress Notes (Signed)
D: Pt denies SI/HI/AVH. Pt is irritable and appears to have issues with everything mentioned to him. Pt stated he needed to have his Klonopin upped to 1 mg TID, pt complained of a boil on L-pubic area , pt stated he wanted his toenails cut. Pt continues to ask for Ginger-ale even though he has diabetes. Pt appears to continue to be non-compliant with his dietary restrictions .   A: Pt was offered support and encouragement. Pt was given scheduled medications. Pt was encourage to attend groups. Q 15 minute checks were done for safety.   R:Pt attends groups and interacts well with peers and staff. Pt is taking medication. Pt receptive to treatment and safety maintained on unit.

## 2017-11-12 NOTE — Plan of Care (Signed)
  Safety: Periods of time without injury will increase 11/12/2017 2049 - Progressing by Providence Crosby, RN Note Pt safe on the unit at this time

## 2017-11-12 NOTE — ED Provider Notes (Signed)
Asked to see patient because of cough and congestion.  Patient sleeping, resting comfortably.  Upper airway resonance and congestion noted, no wheezing.  No rhonchi or signs of pneumonia.  Vitals:   11/12/17 0400 11/12/17 0440  BP: 115/68   Pulse:    Resp:    Temp:  (!) 97.5 F (36.4 C)  SpO2:        Orpah Greek, MD 11/12/17 [phone]

## 2017-11-13 DIAGNOSIS — R45851 Suicidal ideations: Secondary | ICD-10-CM

## 2017-11-13 DIAGNOSIS — F17210 Nicotine dependence, cigarettes, uncomplicated: Secondary | ICD-10-CM

## 2017-11-13 DIAGNOSIS — M549 Dorsalgia, unspecified: Secondary | ICD-10-CM

## 2017-11-13 DIAGNOSIS — F251 Schizoaffective disorder, depressive type: Secondary | ICD-10-CM

## 2017-11-13 LAB — GLUCOSE, CAPILLARY
GLUCOSE-CAPILLARY: 193 mg/dL — AB (ref 65–99)
Glucose-Capillary: 176 mg/dL — ABNORMAL HIGH (ref 65–99)
Glucose-Capillary: 279 mg/dL — ABNORMAL HIGH (ref 65–99)
Glucose-Capillary: 186 mg/dL — ABNORMAL HIGH (ref 65–99)

## 2017-11-13 MED ORDER — GABAPENTIN 400 MG PO CAPS
400.0000 mg | ORAL_CAPSULE | Freq: Four times a day (QID) | ORAL | Status: DC
  Administered 2017-11-13 – 2017-11-15 (×7): 400 mg via ORAL
  Filled 2017-11-13 (×12): qty 1

## 2017-11-13 NOTE — H&P (Addendum)
Psychiatric Admission Assessment Adult  Patient Identification: [name] MRN:  [id number] Date of Evaluation:  11/13/2017 Chief Complaint:  " I took a few pills " Principal Diagnosis: Schizoaffective Disorder, Depressed  Diagnosis:   Patient Active Problem List   Diagnosis Date Noted  . MDD (major depressive disorder) [F32.9] 11/12/2017  . MDD (major depressive disorder), recurrent severe, without psychosis ([place]) [F33.2] 10/12/2017  . Diabetic neuropathy ([place]) [E11.40] 11/11/2016  . Anxiety and depression [F41.9, F32.9] 11/11/2016  . Chronic insomnia [F51.04] 01/08/2016  . Alcohol use disorder [IMO0002] 12/10/2015  . Benzodiazepine abuse ([place]) [F13.10] 12/10/2015  . Opioid abuse ([place]) [F11.10] 12/10/2015  . Constipation [K59.00] 02/06/2015  . BPH (benign prostatic hyperplasia) [N40.0] 01/16/2015  . Vitamin D deficiency [E55.9] 01/16/2015  . Allergic rhinitis [J30.9] 11/19/2014  . Chronic low back pain [M54.5, G89.29] 11/07/2014  . Onychomycosis of toenail [B35.1] 11/07/2014  . Tinea pedis [B35.3] 11/07/2014  . Psoriasis [L40.9] 10/14/2014  . HTN (hypertension) [I10] 10/14/2014  . Diabetes mellitus type 2, controlled ([place]) [E11.9] 10/14/2014  . COPD (chronic obstructive pulmonary disease) ([place]) [J44.9] 10/14/2014  . Tobacco abuse [Z72.0] 10/14/2014  . HLD (hyperlipidemia) [E78.5] 10/14/2014  . Schizoaffective disorder, depressive type ([place]) [F25.1] 10/14/2014   History of Present Illness:  Patient is  57 year old male. Presents as fair historian.  He is known to our unit from a prior admission from 10/13/17- 10/19/17. At the time presented for worsening depression and suicidal ideations. At the time was diagnosed with Schizoaffective Disorder , Depressed. He was discharged on Klonopin, Neurontin, [place], with next dose due 11/13/17. Patient presented to ED on 1/25 reporting Vistaril overdose. He states his intention was not suicidal but simply to get some sleep. States  that after the overdose he felt he was not breathing well so told staff and was brought to ED . Patient reports ongoing depression, which he attributes to loss of loved ones ( mother and a nephew passed away last year), and also to current living situation. He is residing at [facility] and states " they don't treat me well, I don't want to go back".  He endorses some neuro-vegetative symptoms of depression as below . Admission BAL <10. Admission UDS positive for BZDs .   Associated Signs/Symptoms: Depression Symptoms:  depressed mood, insomnia, suicidal thoughts without plan, decreased appetite, (Hypo) Manic Symptoms: none endorsed  Anxiety Symptoms:  Reports increased anxiety  Psychotic Symptoms: denies  PTSD Symptoms: Does not endorse Total Time spent with patient: 45 minutes   Past Psychiatric History: history of prior psychiatric admission earlier this month - see above . Was diagnosed with Schizoaffective Disorder.  States he has had several  prior admissions starting in his 68s  due to alcohol abuse and for depression Reports prior suicide attempt by hanging self several years ago.   Is the patient at risk to self? Yes.    Has the patient been a risk to self in the past 6 months? Yes.    Has the patient been a risk to self within the distant past? No.  Is the patient a risk to others? No.  Has the patient been a risk to others in the past 6 months? No.  Has the patient been a risk to others within the distant past? No.   Prior Inpatient Therapy:  as above Prior Outpatient Therapy:  states he has an ACT team but states he has not seen ACT staff in " a few weeks". Had been referred  to [facility] after last discharge .  Alcohol Screening: 1. How often do you have a drink containing alcohol?: Monthly or less 2. How many drinks containing alcohol do you have on a typical day when you are drinking?: 1 or 2 3. How often do you have six or more drinks on one occasion?:  Never AUDIT-C Score: 1 4. How often during the last year have you found that you were not able to stop drinking once you had started?: Never 5. How often during the last year have you failed to do what was normally expected from you becasue of drinking?: Never 6. How often during the last year have you needed a first drink in the morning to get yourself going after a heavy drinking session?: Never 7. How often during the last year have you had a feeling of guilt of remorse after drinking?: Never 8. How often during the last year have you been unable to remember what happened the night before because you had been drinking?: Never 9. Have you or someone else been injured as a result of your drinking?: No 10. Has a relative or friend or a doctor or another health worker been concerned about your drinking or suggested you cut down?: No Alcohol Use Disorder Identification Test Final Score (AUDIT): 1 Intervention/Follow-up: AUDIT Score <7 follow-up not indicated Substance Abuse History in the last 12 months:  History of alcohol dependence, states he stopped drinking many years ago. Denies any drug abuse . Consequences of Substance Abuse: Reports history of blackouts and of DTs when he was abusing alcohol. Previous Psychotropic Medications: Klonopin 0.5 mgr TID, Neurontin 400 mgrs TID, Invega Sustenna 234 mgrs Q 30 days IM, next dose due 11/13/17, Ambien 10 mgrs QHS Psychological Evaluations: No  Past Medical History:  Past Medical History:  Diagnosis Date  . Alcohol dependence ([place]) 1979   stated abusing ETOH at age 78   . Back pain   . Benzodiazepine dependence ([place])   . Cardiac arrest ([place])   . Chronic back pain   . Cocaine abuse ([place])   . COPD (chronic obstructive pulmonary disease) ([place])   . Diabetes mellitus without complication ([place]) 0932  . Hypertension 2008  . MI (myocardial infarction) ([place])   . Non-compliance   . Schizoaffective disorder ([place]) 2006   . Substance abuse [facility])      Past Surgical History:  Procedure Laterality Date  . CYST EXCISION     Family History: states both parents are deceased, mother died last year from complications of COPD, has one sister  Family History  Problem Relation Age of Onset  . Heart disease Mother   . Heart disease Father   . Heart disease Maternal Grandmother   . Diabetes Maternal Grandmother    Family Psychiatric  History:  Denies history of mental illness in family. Denies any history of suicides in family.  Tobacco Screening: Have you used any form of tobacco in the last 30 days? (Cigarettes, Smokeless Tobacco, Cigars, and/or Pipes): Yes Tobacco use, Select all that apply: 5 or more cigarettes per day Are you interested in Tobacco Cessation Medications?: Yes, will notify MD for an order Counseled patient on smoking cessation including recognizing danger situations, developing coping skills and basic information about quitting provided: Refused/Declined practical counseling Social History: divorced, has two adult children . Was living at [facility] prior to admission. On disability, has a Programmer, applications . Social History   Substance and Sexual Activity  Alcohol Use Yes  . Alcohol/week:  0.0 oz   Comment: states he drank some the other day      Social History   Substance and Sexual Activity  Drug Use No   Comment: "not right now"    Additional Social History:  Allergies:   Allergies  Allergen Reactions  . Quetiapine Anaphylaxis  . Seroquel [Quetiapine Fumarate] Anxiety and Other (See Comments)    Reaction:  Nightmares   . Trazodone Anaphylaxis  . Trazodone And Nefazodone Anxiety and Other (See Comments)    Other reaction(s): Unknown Nightmares Reaction:  Nightmares   . Haldol [Haloperidol Lactate] Anxiety and Other (See Comments)    Reaction:  Nightmares    Lab Results:  Results for orders placed or performed during the hospital encounter of 11/12/17 (from the past 48 hour(s))  Glucose,  capillary     Status: Abnormal   Collection Time: 11/12/17  8:41 PM  Result Value Ref Range   Glucose-Capillary 182 (H) 65 - 99 mg/dL  Glucose, capillary     Status: Abnormal   Collection Time: 11/13/17  7:47 AM  Result Value Ref Range   Glucose-Capillary 176 (H) 65 - 99 mg/dL    Blood Alcohol level:  Lab Results  Component Value Date   ETH <10 11/11/2017   ETH <10 32/44/0102    Metabolic Disorder Labs:  Lab Results  Component Value Date   HGBA1C 8.1 (H) 10/12/2017   MPG 185.77 10/12/2017   MPG 148 07/29/2015   Lab Results  Component Value Date   PROLACTIN 70.0 (H) 12/11/2015   Lab Results  Component Value Date   CHOL 184 11/11/2016   TRIG 204 (H) 11/11/2016   HDL 25 (L) 11/11/2016   CHOLHDL 7.4 (H) 11/11/2016   VLDL 43 (H) 12/11/2015   LDLCALC 118 (H) 11/11/2016   LDLCALC 88 12/11/2015    Current Medications: Current Facility-Administered Medications  Medication Dose Route Frequency Provider Last Rate Last Dose  . acetaminophen (TYLENOL) tablet 650 mg  650 mg Oral Q4H PRN Okonkwo, Justina A, NP   650 mg at 11/13/17 0804  . albuterol (PROVENTIL HFA;VENTOLIN HFA) 108 (90 Base) MCG/ACT inhaler 2 puff  2 puff Inhalation Q6H PRN Okonkwo, Justina A, NP      . aspirin EC tablet 81 mg  81 mg Oral Daily Okonkwo, Justina A, NP   81 mg at 11/13/17 0805  . atorvastatin (LIPITOR) tablet 40 mg  40 mg Oral q1800 Okonkwo, Justina A, NP   40 mg at 11/12/17 1717  . benzonatate (TESSALON) capsule 100 mg  100 mg Oral TID PRN Lu Duffel, Justina A, NP      . clonazePAM (KLONOPIN) tablet 0.5 mg  0.5 mg Oral TID Lu Duffel, Justina A, NP   0.5 mg at 11/13/17 0804  . gabapentin (NEURONTIN) capsule 400 mg  400 mg Oral TID Lu Duffel, Justina A, NP   400 mg at 11/13/17 0805  . guaiFENesin (MUCINEX) 12 hr tablet 1,200 mg  1,200 mg Oral BID Okonkwo, Justina A, NP   1,200 mg at 11/13/17 0804  . ibuprofen (ADVIL,MOTRIN) tablet 800 mg  800 mg Oral Q6H PRN Okonkwo, Justina A, NP   800 mg at 11/12/17 2114   . insulin NPH Human (HUMULIN N,NOVOLIN N) injection 10 Units  10 Units Subcutaneous BID AC & HS Okonkwo, Justina A, NP   10 Units at 11/12/17 2113  . isosorbide dinitrate (ISORDIL) tablet 30 mg  30 mg Oral Daily Okonkwo, Justina A, NP   30 mg at 11/13/17 0804  . lisinopril (  PRINIVIL,ZESTRIL) tablet 20 mg  20 mg Oral Daily Okonkwo, Justina A, NP   20 mg at 11/13/17 0805  . metFORMIN (GLUCOPHAGE-XR) 24 hr tablet 500 mg  500 mg Oral BID WC Okonkwo, Justina A, NP   500 mg at 11/13/17 0805  . mupirocin ointment (BACTROBAN) 2 %   Topical BID Lindon Romp A, NP      . nicotine (NICODERM CQ - dosed in mg/24 hours) patch 21 mg  21 mg Transdermal Daily Okonkwo, Justina A, NP   21 mg at 11/13/17 0808  . ondansetron (ZOFRAN) tablet 4 mg  4 mg Oral Q8H PRN Okonkwo, Justina A, NP      . pantoprazole (PROTONIX) EC tablet 40 mg  40 mg Oral Daily Okonkwo, Justina A, NP   40 mg at 11/13/17 0804  . zolpidem (AMBIEN) tablet 5 mg  5 mg Oral QHS PRN Lu Duffel, Justina A, NP   5 mg at 11/12/17 2114   PTA Medications: Medications Prior to Admission  Medication Sig Dispense Refill Last Dose  . albuterol (PROVENTIL HFA;VENTOLIN HFA) 108 (90 Base) MCG/ACT inhaler Inhale 2 puffs into the lungs every 6 (six) hours as needed for wheezing or shortness of breath. 1 Inhaler 0 UNKNOWN  . aspirin EC 81 MG tablet Take 1 tablet (81 mg total) by mouth daily. For heart health 1 tablet 0 11/11/2017 at Unknown time  . atorvastatin (LIPITOR) 40 MG tablet TAKE 1 TABLET BY MOUTH EVERY DAY: For high Cholesterol 1 tablet 0 11/10/2017 at Unknown time  . clonazePAM (KLONOPIN) 0.5 MG tablet Take 1 tablet (0.5 mg total) by mouth 3 (three) times daily. 10 tablet 0 UNKNOWN  . fluticasone (FLONASE) 50 MCG/ACT nasal spray Place 1 spray into both nostrils daily. For allergies  0 UNKNOWN  . Fluticasone-Salmeterol (ADVAIR DISKUS) 100-50 MCG/DOSE AEPB Inhale 1 puff into the lungs 2 (two) times daily.   11/11/2017 at Unknown time  . gabapentin (NEURONTIN)  400 MG capsule Take 1 capsule (400 mg total) by mouth 3 (three) times daily. For agitation/neuropathic pain (Patient taking differently: Take 800 mg by mouth 4 (four) times daily. For agitation/neuropathic pain) 120 capsule 0 11/11/2017 at Unknown time  . ibuprofen (ADVIL,MOTRIN) 800 MG tablet Take 1 tablet (800 mg total) by mouth every 6 (six) hours as needed (moderate to severe pain). 20 tablet 0 UNKNOWN  . insulin aspart (NOVOLOG FLEXPEN) 100 UNIT/ML FlexPen Inject 10-15 Units into the skin as directed. For diabetese (Patient taking differently: Inject 10-15 Units into the skin 3 (three) times daily with meals. 15 units ever morning then take as directed per sliding scale instructions for lunch and dinner) 15 mL 0 11/11/2017 at Unknown time  . isosorbide dinitrate (ISORDIL) 30 MG tablet Take 1 tablet (30 mg total) by mouth daily. For chest pain   11/11/2017 at Unknown time  . lisinopril (PRINIVIL,ZESTRIL) 20 MG tablet Take 1 tablet (20 mg total) by mouth daily. For high blood pressure (Patient taking differently: Take 10 mg by mouth daily. For high blood pressure) 10 tablet 0 11/11/2017 at Unknown time  . metoprolol tartrate (LOPRESSOR) 25 MG tablet Take 25 mg by mouth 2 (two) times daily.   11/11/2017 at 800a  . paliperidone (INVEGA SUSTENNA) 234 MG/1.5ML SUSP injection Inject 234 mg into the muscle every 30 (thirty) days. (Due on 11-13-17): For mood control 0.9 mL 0 Past Month at Unknown time  . valACYclovir (VALTREX) 500 MG tablet Take 500 mg by mouth 3 (three) times daily.  0 11/11/2017 at  Unknown time  . zolpidem (AMBIEN) 10 MG tablet Take 1 tablet (10 mg total) by mouth at bedtime as needed for sleep. 7 tablet 0 unknown at Unknown time    Musculoskeletal: Strength & Muscle Tone: within normal limits-  Minimal tremors, no diaphoresis, no restlessness  Gait & Station: slow gait  Patient leans: N/A  Psychiatric Specialty Exam: Physical Exam  Review of Systems  Constitutional: Negative.   HENT:  Negative.   Eyes: Negative.   Respiratory: Negative.   Cardiovascular: Negative.   Gastrointestinal: Negative for diarrhea, nausea and vomiting.  Genitourinary: Negative.   Musculoskeletal: Positive for back pain.       Reports toe pain, discomfort which he states is related to poorly cut toenails   Skin: Negative.   Neurological: Negative for seizures and headaches.  Endo/Heme/Allergies: Negative.   Psychiatric/Behavioral: Positive for depression.  All other systems reviewed and are negative.   Blood pressure (!) 151/87, pulse 84, temperature 98.4 F (36.9 C), temperature source Oral, resp. rate 18, height 5' 10.5" (1.791 m), weight 98.9 kg (218 lb), SpO2 97 %.Body mass index is 30.84 kg/m.  General Appearance: Fairly Groomed  Eye Contact:  Fair  Speech:  Normal Rate  Volume:  Normal  Mood:  reports " I feel miserable"  Affect:  restricted, irritable  Thought Process:  Linear and Descriptions of Associations: Circumstantial  Orientation:  Other:  fully alert and attentive   Thought Content:  no hallucinations, no delusions, not internally preoccupied   Suicidal Thoughts:  No denies any current suicidal or self injurious ideations, denies any homicidal or violent ideations  Homicidal Thoughts:  No  Memory:  recent and remote grossly intact   Judgement:  Fair  Insight:  Fair  Psychomotor Activity:  Normal  Concentration:  Concentration: Fair and Attention Span: Fair  Recall:  [employer] of Knowledge:  Fair  Language:  Fair  Akathisia:  Negative  Handed:  Right  AIMS (if indicated):     Assets:  Resilience  ADL's:  Fair   Cognition:  WNL  Sleep:  Number of Hours: 5    Treatment Plan Summary: Daily contact with patient to assess and evaluate symptoms and progress in treatment, Medication management, Plan inpatient treatment and medications as below   Observation Level/Precautions:  15 minute checks  Laboratory:  as needed  Check HgbA1C, Lipid Panel as he is on  antipsychotic medication management .   Psychotherapy:  Groups, milieu   Medications:   Continue Klonopin 0.5 mgrs TID , which he states he has been taking regularly for " years". Continue Neurontin 400 mgrs QID for anxiety, pain - patient reports he was taking mediation QID rather than TID.  Will discuss Invega Sustenna  Management with treatment team/ coordinate with ACT team-     Consultations:  As needed   Discharge Concerns:  -states he is refusing to return to [place]  Estimated LOS: 5-6 days   Other:     Physician Treatment Plan for Primary Diagnosis:  Schizoaffective Disorder ,Depressed Long Term Goal(s): Improvement in symptoms so as ready for discharge  Short Term Goals: Ability to identify changes in lifestyle to reduce recurrence of condition will improve and Ability to maintain clinical measurements within normal limits will improve  Physician Treatment Plan for Secondary Diagnosis:  Schizoaffective Disorder  Long Term Goal(s): Improvement in symptoms so as ready for discharge  Short Term Goals: Ability to verbalize feelings will improve, Ability to disclose and discuss suicidal ideas,  Ability to demonstrate self-control will improve and Ability to identify and develop effective coping behaviors will improve  I certify that inpatient services furnished can reasonably be expected to improve the patient's condition.    Jenne Campus, MD 1/27/201911:57 AM

## 2017-11-13 NOTE — Progress Notes (Signed)
D: Pt irritable, labile on unit. Pt expressed interest in getting antibiotic. Pt was informed he  [name] he was on Valtrex for shingles and informed to let the doctor know that he was still having issues.   A: Pt was offered support and encouragement. Pt was given scheduled medications. Pt was encourage to attend groups. Q 15 minute checks were done for safety.   R: safety maintained on unit.

## 2017-11-13 NOTE — BHH Group Notes (Signed)
[facility] Group Notes:  (Nursing/MHT/Case Management/Adjunct)  Date:  11/13/2017  Time:  11:46 AM Type of Therapy:  Psychoeducational Skills  Participation Level:  Did Not Attend  Participation Quality:  DID NOT ATTEND  Affect:  DID NOT ATTEND  Cognitive:  DID NOT ATTEND  Insight:  None  Engagement in Group:  DID NOT ATTEND  Modes of Intervention:  DID NOT ATTEND  Summary of Progress/Problems: Pt did not attend patient self inventory group.   Wolfgang Phoenix 11/13/2017, 11:46 AM

## 2017-11-13 NOTE — BHH Suicide Risk Assessment (Signed)
[facility] Admission Suicide Risk Assessment   Nursing information obtained from:  Patient Demographic factors:  Male, Caucasian Current Mental Status:  Self-harm thoughts Loss Factors:  Decline in physical health, Loss of significant relationship Historical Factors:  Impulsivity Risk Reduction Factors:  NA  Total Time spent with patient: 45 minutes  Principal Problem:  Schizoaffective Disorder by History  Diagnosis:   Patient Active Problem List   Diagnosis Date Noted  . MDD (major depressive disorder) [F32.9] 11/12/2017  . MDD (major depressive disorder), recurrent severe, without psychosis ([place]) [F33.2] 10/12/2017  . Diabetic neuropathy ([place]) [E11.40] 11/11/2016  . Anxiety and depression [F41.9, F32.9] 11/11/2016  . Chronic insomnia [F51.04] 01/08/2016  . Alcohol use disorder [IMO0002] 12/10/2015  . Benzodiazepine abuse ([place]) [F13.10] 12/10/2015  . Opioid abuse ([place]) [F11.10] 12/10/2015  . Constipation [K59.00] 02/06/2015  . BPH (benign prostatic hyperplasia) [N40.0] 01/16/2015  . Vitamin D deficiency [E55.9] 01/16/2015  . Allergic rhinitis [J30.9] 11/19/2014  . Chronic low back pain [M54.5, G89.29] 11/07/2014  . Onychomycosis of toenail [B35.1] 11/07/2014  . Tinea pedis [B35.3] 11/07/2014  . Psoriasis [L40.9] 10/14/2014  . HTN (hypertension) [I10] 10/14/2014  . Diabetes mellitus type 2, controlled ([place]) [E11.9] 10/14/2014  . COPD (chronic obstructive pulmonary disease) ([place]) [J44.9] 10/14/2014  . Tobacco abuse [Z72.0] 10/14/2014  . HLD (hyperlipidemia) [E78.5] 10/14/2014  . Schizoaffective disorder, depressive type ([place]) [F25.1] 10/14/2014    Continued Clinical Symptoms:  Alcohol Use Disorder Identification Test Final Score (AUDIT): 1 The "Alcohol Use Disorders Identification Test", Guidelines for Use in Primary Care, Second Edition.  World Pharmacologist [facility]). Score between 0-7:  no or low risk or alcohol related problems. Score between 8-15:  moderate risk of alcohol  related problems. Score between 16-19:  high risk of alcohol related problems. Score 20 or above:  warrants further diagnostic evaluation for alcohol dependence and treatment.   CLINICAL FACTORS:  57 year old male, group home resident, who  known to our unit from a recent psychiatric admission for worsening depression. He has been diagnosed with Schizoaffective Disorder in the past, and had been started on [place] during his prior admission. Presents following an overdose on Vistaril , which he reports was not suicidal but an attempt to get sleep. Endorses ongoing depression, but denies SI at present .  Presents irritable , dysphoric.      Psychiatric Specialty Exam: Physical Exam  ROS  Blood pressure (!) 151/87, pulse 84, temperature 98.4 F (36.9 C), temperature source Oral, resp. rate 18, height 5' 10.5" (1.791 m), weight 98.9 kg (218 lb), SpO2 97 %.Body mass index is 30.84 kg/m.   see admit note MSE    COGNITIVE FEATURES THAT CONTRIBUTE TO RISK:  Closed-mindedness and Loss of executive function    SUICIDE RISK:   Moderate:  Frequent suicidal ideation with limited intensity, and duration, some specificity in terms of plans, no associated intent, good self-control, limited dysphoria/symptomatology, some risk factors present, and identifiable protective factors, including available and accessible social support.  PLAN OF CARE: Patient will be admitted to inpatient psychiatric unit for stabilization and safety. Will provide and encourage milieu participation. Provide medication management and maked adjustments as needed.  Will follow daily.    I certify that inpatient services furnished can reasonably be expected to improve the patient's condition.   Jenne Campus, MD 11/13/2017, 12:45 PM

## 2017-11-13 NOTE — Progress Notes (Signed)
DAR NOTE: Patient presents with anxious affect and irritable mood.  Pt has been uncooperative with his care. Pt did not take his insulin this am stating " I don't for it" Pt ha been demanding for Gatorade and diet ginger al around the clock. Pt at some point became verbally abusive towards staff and feel like the staff are not helpful to him. Pt stayed in the room most of the day, stating he is not going to a dam thing for [place]. Reports fair sleep,fair appetite, normal energy, and good concentration. Pt complained of lower back  Pain, denies auditory and visual hallucinations.  Rates depression at 4, hopelessness at 7, and anxiety at 9.  Maintained on routine safety checks.  Medications given as prescribed.  Support and encouragement offered as needed.  Attended group and participated.  States goal for today is "to feel better and go home."  Will continue to monitor.

## 2017-11-13 NOTE — Progress Notes (Signed)
Pt came to nurses station demanding a diet ginger al, pt was told we ran out of diet ginger al and offered water instead. Pt became mad, threw the cup on the hallway, threw trash at the room mate bed. Pt also has been urinating all over the floor, on the toilet seat and the bath room floor. Pt has been cleaning his urine for him to use the bathroom. Pt is has been verbally abusive towards staff, " you are crooked." Per [facility], his room mate will spend night in the quite room.

## 2017-11-13 NOTE — Plan of Care (Signed)
  Safety: Periods of time without injury will increase 11/13/2017 2341 - Progressing by Providence Crosby, RN Note Pt safe on unit at this time

## 2017-11-13 NOTE — BHH Group Notes (Signed)
[facility] LCSW Group Therapy Note  Date/Time:  11/13/2017  11:00AM-12:00PM  Type of Therapy and Topic:  Group Therapy:  Music and Mood  Participation Level:  Did Not Attend   Description of Group: In this process group, members listened to a variety of genres of music and identified that different types of music evoke different responses.  Patients were encouraged to identify music that was soothing for them and music that was energizing for them.  Patients discussed how this knowledge can help with wellness and recovery in various ways including managing depression and anxiety as well as encouraging healthy sleep habits.    Therapeutic Goals: 1. Patients will explore the impact of different varieties of music on mood 2. Patients will verbalize the thoughts they have when listening to different types of music 3. Patients will identify music that is soothing to them as well as music that is energizing to them 4. Patients will discuss how to use this knowledge to assist in maintaining wellness and recovery 5. Patients will explore the use of music as a coping skill  Summary of Patient Progress:  N/A  Therapeutic Modalities: Solution Focused Brief Therapy Activity   Selmer Dominion, LCSW

## 2017-11-13 NOTE — BHH Counselor (Signed)
Clinical Social Work Note  Psychosocial Assessment was attempted but patient refused, stating "the doctor won't help me so I'm not gonna help ya'll."  He agreed that CSW could try again tomorrow.  Selmer Dominion, LCSW 11/13/2017, 2:12 PM

## 2017-11-14 DIAGNOSIS — I10 Essential (primary) hypertension: Secondary | ICD-10-CM

## 2017-11-14 DIAGNOSIS — E785 Hyperlipidemia, unspecified: Secondary | ICD-10-CM

## 2017-11-14 DIAGNOSIS — Z91411 Personal history of adult psychological abuse: Secondary | ICD-10-CM

## 2017-11-14 DIAGNOSIS — F419 Anxiety disorder, unspecified: Secondary | ICD-10-CM

## 2017-11-14 DIAGNOSIS — T43591A Poisoning by other antipsychotics and neuroleptics, accidental (unintentional), initial encounter: Secondary | ICD-10-CM

## 2017-11-14 DIAGNOSIS — R440 Auditory hallucinations: Secondary | ICD-10-CM

## 2017-11-14 DIAGNOSIS — F333 Major depressive disorder, recurrent, severe with psychotic symptoms: Secondary | ICD-10-CM

## 2017-11-14 DIAGNOSIS — G4700 Insomnia, unspecified: Secondary | ICD-10-CM

## 2017-11-14 DIAGNOSIS — E119 Type 2 diabetes mellitus without complications: Secondary | ICD-10-CM

## 2017-11-14 LAB — HEMOGLOBIN A1C
Hgb A1c MFr Bld: 8.5 % — ABNORMAL HIGH (ref 4.8–5.6)
Mean Plasma Glucose: 197.25 mg/dL

## 2017-11-14 LAB — GLUCOSE, CAPILLARY
GLUCOSE-CAPILLARY: 156 mg/dL — AB (ref 65–99)
Glucose-Capillary: 213 mg/dL — ABNORMAL HIGH (ref 65–99)

## 2017-11-14 LAB — LIPID PANEL
Cholesterol: 127 mg/dL (ref 0–200)
HDL: 30 mg/dL — AB (ref >40)
LDL Cholesterol: 58 mg/dL (ref 0–99)
TRIGLYCERIDES: 195 mg/dL — AB (ref <150)
Total CHOL/HDL Ratio: 4.2 RATIO
VLDL: 39 mg/dL (ref 0–40)

## 2017-11-14 MED ORDER — PALIPERIDONE PALMITATE 234 MG/1.5ML IM SUSP
234.0000 mg | INTRAMUSCULAR | Status: DC
  Administered 2017-11-14: 234 mg via INTRAMUSCULAR
  Filled 2017-11-14: qty 1.5

## 2017-11-14 MED ORDER — INSULIN NPH (HUMAN) (ISOPHANE) 100 UNIT/ML ~~LOC~~ SUSP
10.0000 [IU] | Freq: Two times a day (BID) | SUBCUTANEOUS | Status: DC
  Administered 2017-11-14 – 2017-11-15 (×2): 10 [IU] via SUBCUTANEOUS

## 2017-11-14 NOTE — Tx Team (Signed)
Interdisciplinary Treatment and Diagnostic Plan Update  11/14/2017 Time of Session: [place] MRN: [id number]  Principal Diagnosis: <principal problem not specified>  Secondary Diagnoses: Active Problems:   MDD (major depressive disorder)   Current Medications:  Current Facility-Administered Medications  Medication Dose Route Frequency Provider Last Rate Last Dose  . acetaminophen (TYLENOL) tablet 650 mg  650 mg Oral Q4H PRN Okonkwo, Justina A, NP   650 mg at 11/14/17 9407  . albuterol (PROVENTIL HFA;VENTOLIN HFA) 108 (90 Base) MCG/ACT inhaler 2 puff  2 puff Inhalation Q6H PRN Okonkwo, Justina A, NP      . aspirin EC tablet 81 mg  81 mg Oral Daily Okonkwo, Justina A, NP   81 mg at 11/14/17 0823  . atorvastatin (LIPITOR) tablet 40 mg  40 mg Oral q1800 Okonkwo, Justina A, NP   40 mg at 11/13/17 1759  . benzonatate (TESSALON) capsule 100 mg  100 mg Oral TID PRN Lu Duffel, Justina A, NP      . clonazePAM (KLONOPIN) tablet 0.5 mg  0.5 mg Oral TID Cobos, Myer Peer, MD   0.5 mg at 11/14/17 1157  . gabapentin (NEURONTIN) capsule 400 mg  400 mg Oral QID Cobos, Myer Peer, MD   400 mg at 11/14/17 1157  . guaiFENesin (MUCINEX) 12 hr tablet 1,200 mg  1,200 mg Oral BID Okonkwo, Justina A, NP   1,200 mg at 11/14/17 0823  . ibuprofen (ADVIL,MOTRIN) tablet 800 mg  800 mg Oral Q6H PRN Okonkwo, Justina A, NP   800 mg at 11/14/17 0824  . insulin NPH Human (HUMULIN N,NOVOLIN N) injection 10 Units  10 Units Subcutaneous BID AC & HS [place], Christopher T, MD      . isosorbide dinitrate (ISORDIL) tablet 30 mg  30 mg Oral Daily Okonkwo, Justina A, NP   30 mg at 11/14/17 6808  . lisinopril (PRINIVIL,ZESTRIL) tablet 20 mg  20 mg Oral Daily Okonkwo, Justina A, NP   20 mg at 11/14/17 8110  . metFORMIN (GLUCOPHAGE-XR) 24 hr tablet 500 mg  500 mg Oral BID WC Okonkwo, Justina A, NP   500 mg at 11/14/17 0821  . mupirocin ointment (BACTROBAN) 2 %   Topical BID Lindon Romp A, NP      . nicotine (NICODERM CQ -  dosed in mg/24 hours) patch 21 mg  21 mg Transdermal Daily Okonkwo, Justina A, NP   21 mg at 11/14/17 0821  . ondansetron (ZOFRAN) tablet 4 mg  4 mg Oral Q8H PRN Okonkwo, Justina A, NP      . paliperidone (INVEGA SUSTENNA) injection 234 mg  234 mg Intramuscular Q30 days Maris Berger T, MD      . pantoprazole (PROTONIX) EC tablet 40 mg  40 mg Oral Daily Okonkwo, Justina A, NP   40 mg at 11/14/17 3159  . zolpidem (AMBIEN) tablet 5 mg  5 mg Oral QHS PRN Lu Duffel, Justina A, NP   5 mg at 11/13/17 2127   PTA Medications: Medications Prior to Admission  Medication Sig Dispense Refill Last Dose  . albuterol (PROVENTIL HFA;VENTOLIN HFA) 108 (90 Base) MCG/ACT inhaler Inhale 2 puffs into the lungs every 6 (six) hours as needed for wheezing or shortness of breath. 1 Inhaler 0 UNKNOWN  . aspirin EC 81 MG tablet Take 1 tablet (81 mg total) by mouth daily. For heart health 1 tablet 0 11/11/2017 at Unknown time  . atorvastatin (LIPITOR) 40 MG tablet TAKE 1 TABLET BY MOUTH EVERY DAY: For high Cholesterol 1 tablet 0  11/10/2017 at Unknown time  . clonazePAM (KLONOPIN) 0.5 MG tablet Take 1 tablet (0.5 mg total) by mouth 3 (three) times daily. 10 tablet 0 UNKNOWN  . fluticasone (FLONASE) 50 MCG/ACT nasal spray Place 1 spray into both nostrils daily. For allergies  0 UNKNOWN  . Fluticasone-Salmeterol (ADVAIR DISKUS) 100-50 MCG/DOSE AEPB Inhale 1 puff into the lungs 2 (two) times daily.   11/11/2017 at Unknown time  . gabapentin (NEURONTIN) 400 MG capsule Take 1 capsule (400 mg total) by mouth 3 (three) times daily. For agitation/neuropathic pain (Patient taking differently: Take 800 mg by mouth 4 (four) times daily. For agitation/neuropathic pain) 120 capsule 0 11/11/2017 at Unknown time  . ibuprofen (ADVIL,MOTRIN) 800 MG tablet Take 1 tablet (800 mg total) by mouth every 6 (six) hours as needed (moderate to severe pain). 20 tablet 0 UNKNOWN  . insulin aspart (NOVOLOG FLEXPEN) 100 UNIT/ML FlexPen Inject 10-15 Units  into the skin as directed. For diabetese (Patient taking differently: Inject 10-15 Units into the skin 3 (three) times daily with meals. 15 units ever morning then take as directed per sliding scale instructions for lunch and dinner) 15 mL 0 11/11/2017 at Unknown time  . isosorbide dinitrate (ISORDIL) 30 MG tablet Take 1 tablet (30 mg total) by mouth daily. For chest pain   11/11/2017 at Unknown time  . lisinopril (PRINIVIL,ZESTRIL) 20 MG tablet Take 1 tablet (20 mg total) by mouth daily. For high blood pressure (Patient taking differently: Take 10 mg by mouth daily. For high blood pressure) 10 tablet 0 11/11/2017 at Unknown time  . metoprolol tartrate (LOPRESSOR) 25 MG tablet Take 25 mg by mouth 2 (two) times daily.   11/11/2017 at 800a  . paliperidone (INVEGA SUSTENNA) 234 MG/1.5ML SUSP injection Inject 234 mg into the muscle every 30 (thirty) days. (Due on 11-13-17): For mood control 0.9 mL 0 Past Month at Unknown time  . valACYclovir (VALTREX) 500 MG tablet Take 500 mg by mouth 3 (three) times daily.  0 11/11/2017 at Unknown time  . zolpidem (AMBIEN) 10 MG tablet Take 1 tablet (10 mg total) by mouth at bedtime as needed for sleep. 7 tablet 0 unknown at Unknown time    Patient Stressors: Health problems Marital or family conflict Medication change or noncompliance  Patient Strengths: Technical sales engineer for treatment/growth  Treatment Modalities: Medication Management, Group therapy, Case management,  1 to 1 session with clinician, Psychoeducation, Recreational therapy.   Physician Treatment Plan for Primary Diagnosis: <principal problem not specified> Long Term Goal(s): Improvement in symptoms so as ready for discharge Improvement in symptoms so as ready for discharge   Short Term Goals: Ability to identify changes in lifestyle to reduce recurrence of condition will improve Ability to maintain clinical measurements within normal limits will improve Ability to verbalize  feelings will improve Ability to disclose and discuss suicidal ideas Ability to demonstrate self-control will improve Ability to identify and develop effective coping behaviors will improve  Medication Management: Evaluate patient's response, side effects, and tolerance of medication regimen.  Therapeutic Interventions: 1 to 1 sessions, Unit Group sessions and Medication administration.  Evaluation of Outcomes: Not Met  Physician Treatment Plan for Secondary Diagnosis: Active Problems:   MDD (major depressive disorder)  Long Term Goal(s): Improvement in symptoms so as ready for discharge Improvement in symptoms so as ready for discharge   Short Term Goals: Ability to identify changes in lifestyle to reduce recurrence of condition will improve Ability to maintain clinical measurements within normal limits will  improve Ability to verbalize feelings will improve Ability to disclose and discuss suicidal ideas Ability to demonstrate self-control will improve Ability to identify and develop effective coping behaviors will improve     Medication Management: Evaluate patient's response, side effects, and tolerance of medication regimen.  Therapeutic Interventions: 1 to 1 sessions, Unit Group sessions and Medication administration.  Evaluation of Outcomes: Not Met   RN Treatment Plan for Primary Diagnosis: <principal problem not specified> Long Term Goal(s): Knowledge of disease and therapeutic regimen to maintain health will improve  Short Term Goals: Ability to identify and develop effective coping behaviors will improve and Compliance with prescribed medications will improve  Medication Management: RN will administer medications as ordered by provider, will assess and evaluate patient's response and provide education to patient for prescribed medication. RN will report any adverse and/or side effects to prescribing provider.  Therapeutic Interventions: 1 on 1 counseling sessions,  Psychoeducation, Medication administration, Evaluate responses to treatment, Monitor vital signs and CBGs as ordered, Perform/monitor CIWA, COWS, AIMS and Fall Risk screenings as ordered, Perform wound care treatments as ordered.  Evaluation of Outcomes: Not Met   LCSW Treatment Plan for Primary Diagnosis: <principal problem not specified> Long Term Goal(s): Safe transition to appropriate next level of care at discharge, Engage patient in therapeutic group addressing interpersonal concerns.  Short Term Goals: Engage patient in aftercare planning with referrals and resources, Increase social support and Increase skills for wellness and recovery  Therapeutic Interventions: Assess for all discharge needs, 1 to 1 time with Social worker, Explore available resources and support systems, Assess for adequacy in community support network, Educate family and significant other(s) on suicide prevention, Complete Psychosocial Assessment, Interpersonal group therapy.  Evaluation of Outcomes: Not Met   Progress in Treatment: Attending groups: No. Participating in groups: No. Taking medication as prescribed: Yes. Toleration medication: Yes. Family/Significant other contact made: No, will contact:  sister Patient understands diagnosis: No Discussing patient identified problems/goals with staff: Yes. Medical problems stabilized or resolved: Yes. Denies suicidal/homicidal ideation: Yes. Issues/concerns per patient self-inventory: No. Other: none  New problem(s) identified: No, Describe:  none  New Short Term/Long Term Goal(s):Pt goal: "get rid of the pain in my hip and back, need someplace to live."  Discharge Plan or Barriers:   Reason for Continuation of Hospitalization: Depression Medication stabilization  Estimated Length of Stay: 3-5 days.  Attendees: Patient: [name] 11/14/2017   Physician: Dr Nancy Fetter, MD 11/14/2017   Nursing: Sena Hitch, RN 11/14/2017   RN Care Manager:  11/14/2017   Social Worker: Lurline Idol, LCSW 11/14/2017   Recreational Therapist:  11/14/2017   Other:  11/14/2017   Other:  11/14/2017   Other: 11/14/2017        Scribe for Treatment Team: [name], LCSW 11/14/2017 1:01 PM

## 2017-11-14 NOTE — Progress Notes (Signed)
Pt did not attend goals/orientation group this morning.  

## 2017-11-14 NOTE — Progress Notes (Signed)
Adult Psychoeducational Group Note  Date:  11/14/2017 Time:  12:10 AM  Group Topic/Focus:  Wrap-Up Group:   The focus of this group is to help patients review their daily goal of treatment and discuss progress on daily workbooks.  Participation Level:  Did Not Attend  Participation Quality:  Did Not Attend  Affect:  Did Not Attend  Cognitive:  Did Not Attend  Insight: None  Engagement in Group:  Did Not Attend  Modes of Intervention:  Did Not Attend  Additional Comments:  Pt did not attend evening wrap up group tonight.  Candy Sledge 11/14/2017, 12:10 AM

## 2017-11-14 NOTE — BHH Group Notes (Signed)
[place] LCSW Group Therapy Note  Date/Time: 11/14/17, 1315  Type of Therapy and Topic:  Group Therapy:  Overcoming Obstacles  Participation Level:  active  Description of Group:    In this group patients will be encouraged to explore what they see as obstacles to their own wellness and recovery. They will be guided to discuss their thoughts, feelings, and behaviors related to these obstacles. The group will process together ways to cope with barriers, with attention given to specific choices patients can make. Each patient will be challenged to identify changes they are motivated to make in order to overcome their obstacles. This group will be process-oriented, with patients participating in exploration of their own experiences as well as giving and receiving support and challenge from other group members.  Therapeutic Goals: 1. Patient will identify personal and current obstacles as they relate to admission. 2. Patient will identify barriers that currently interfere with their wellness or overcoming obstacles.  3. Patient will identify feelings, thought process and behaviors related to these barriers. 4. Patient will identify two changes they are willing to make to overcome these obstacles:    Summary of Patient Progress: Pt shared that money, finances, and housing are his main obstacles.  Pt was quite active in group discussion in a mostly appropriate way.  Pt shared that he has had act team services but they have refused to help him with his housing issues.      Therapeutic Modalities:   Cognitive Behavioral Therapy Solution Focused Therapy Motivational Interviewing Relapse Prevention Therapy  Lurline Idol, LCSW

## 2017-11-14 NOTE — BHH Suicide Risk Assessment (Signed)
[place] INPATIENT:  Family/Significant Other Suicide Prevention Education  Suicide Prevention Education:  Education Completed; Alfonzo Beers, sister, [phone], has been identified by the patient as the family member/significant other with whom the patient will be residing, and identified as the person(s) who will aid the patient in the event of a mental health crisis (suicidal ideations/suicide attempt).  With written consent from the patient, the family member/significant other has been provided the following suicide prevention education, prior to the and/or following the discharge of the patient.  The suicide prevention education provided includes the following:  Suicide risk factors  Suicide prevention and interventions  National Suicide Hotline telephone number  [facility] assessment telephone number  [facility] Emergency Assistance [place] and/or Residential Mobile Crisis Unit telephone number  Request made of family/significant other to:  Remove weapons (e.g., guns, rifles, knives), all items previously/currently identified as safety concern.  Pt does not have guns, per [place].  Remove drugs/medications (over-the-counter, prescriptions, illicit drugs), all items previously/currently identified as a safety concern.  The family member/significant other verbalizes understanding of the suicide prevention education information provided.  The family member/significant other agrees to remove the items of safety concern listed above.  CSW spoke with [name] about pt plan to possible come stay with her.  [name] said that is not possible as her landlord does not allow it.  She said that she does stay in touch with her brother and supports him emotionally, but she cannot help him financially.  [name], LCSW 11/14/2017, 3:40 PM

## 2017-11-14 NOTE — Progress Notes (Signed)
Pt attended afternoon psychoed group and participated in the [facility] for about 15 mins before returning to his room.

## 2017-11-14 NOTE — Progress Notes (Signed)
D:Pt rates depression as a 2 and anxiety as a 7 on 0-10 scale with 10 being the most. Pt is irritable and labile on the unit. He c/o chronic back pain and was given prn medication. When writer rechecked pain level, pt responded "100 because I don't have no coke." Pt stays in his room much of the time and has multiple requests for soda.  A:Offered support and 15 minute checks.  R:Pt denies si and hi thoughts. Safety maintained on the unit.

## 2017-11-14 NOTE — BHH Counselor (Signed)
Adult Comprehensive Assessment  Patient ID: [name], male   DOB: 1961-09-23, 57 y.o.   MRN: [id number]    Information source: Patient   Current Stressors:     Family Relationships: None reported by pt, but has had conflict with sister based on history from last [facility] admission. Financial / Lack of resources (include bankruptcy): Limited income.  Housing / Lack of housing: Pt lives in a group home and states he does not want to return. Physical health (include injuries &life threatening diseases): Pt reports chronic back pain.  Bereavement / Loss: Pt reports both mother and nephew passed away last year.  Living/Environment/Situation:  Living Arrangements: [facility] Living conditions (as described by patient or guardian): Pt stating he does not want to return. How long has patient lived in current situation?: 3 weeks What is atmosphere in current home: unknown.  Family History:  Marital status: Divorced Divorced, when?: Over 51 years ago  What types of issues is patient dealing with in the relationship?: "She took off with another man"  Are you sexually active?: No What is your sexual orientation?: Heterosexual  Has your sexual activity been affected by drugs, alcohol, medication, or emotional stress?: None reported  Does patient have children?: Yes How many children?: 2 How is patient's relationship with their children?: Adult son and daughter, No contact.  Childhood History:  By whom was/is the patient raised?: Mother Additional childhood history information: Pt's father left when he was 48 years old.  Description of patient's relationship with caregiver when they were a child: Good relationship with mother.  Patient's description of current relationship with people who raised him/her: Good relationship with mother.  How were you disciplined when you got in trouble as a child/adolescent?: None reported  Does patient have siblings?: Yes Number of Siblings:  1 Description of patient's current relationship with siblings: Sister; good relationship Did patient suffer any verbal/emotional/physical/sexual abuse as a child?: No Did patient suffer from severe childhood neglect?: No Has patient ever been sexually abused/assaulted/raped as an adolescent or adult?: No Was the patient ever a victim of a crime or a disaster?: No Witnessed domestic violence?: No Has patient been effected by domestic violence as an adult?: No  Education:  Highest grade of school patient has completed: 9th Currently a student?: No Learning disability?: No  Employment/Work Situation:  Employment situation: On disability Why is patient on disability: Schizoaffective disorder  How long has patient been on disability: "A long time"  Patient's job has been impacted by current illness: No What is the longest time patient has a held a job?: NA Where was the patient employed at that time?: NA Has patient ever been in the [employer]?: No  Financial Resources:  Museum/gallery curator resources: Teacher, early years/pre, [employer], Medicare Does patient have a Programmer, applications or guardian?: Yes Name of representative payee or guardian: [name] [facility] is his payee   Alcohol/Substance Abuse:  What has been your use of drugs/alcohol within the last 12 months?: No use reported.  UDS negative except benzos. If attempted suicide, did drugs/alcohol play a role in this?: No Alcohol/Substance Abuse Treatment Hx: Denies past history Has alcohol/substance abuse ever caused legal problems?: No  Social Support System: Patient's Community Support System: fair Describe Community Support System: Family, [facility]T, hoping to get into group home.  Type of faith/religion: Christainity  How does patient's faith help to cope with current illness?: Prayer   Leisure/Recreation:  Leisure and Hobbies: fishing   Strengths/Needs:  What things does the patient do  well?: Fishing  In what areas does patient  struggle / problems for patient: "I got scared because people were shooting outside."   Discharge Plan:  Does patient have access to transportation?: No. Will patient be returning to same living situation after discharge?: No-pt stating he will live with his sister or go to [facility] Currently receiving community mental health services: Unsure:  (From Whom) History at [facility] but reports he has not been there since last discharge.  ([facility]?) Does patient have financial barriers related to discharge medications?: No               Summary/Recommendations:   Summary and Recommendations (to be completed by the evaluator): Pt is 57 year old male who from [facility].  Pt diagnosed with schizoaffective disorder and was admitted from [facility] after an overdose of vistaril.  Recommendations for pt include crisis stabilization, therapeutic milieu, attend and participate in groups, medication management, and development of comprehensive mental wellness plan.  [name]. 11/14/2017

## 2017-11-14 NOTE — Progress Notes (Signed)
Recreation Therapy Notes  Date: 11/14/17 Time: 1000 Location: 500 Hall Dayroom  Group Topic: Anger Management  Goal Area(s) Addresses:  Patient will identify triggers for anger.  Patient will identify physical reaction to anger.   Patient will identify benefit of using coping skills when angry.  Intervention: Worksheet, pencils  Activity: Introduction to Anger Management.  Patients were given a worksheet where they were to identify at least 3 situations/topics/people that lead to anger, how they react to anger and problems caused because of anger.  Patients were also asked to identify coping skills they use to deal with anger.  Education: Anger Management, Discharge Planning   Education Outcome: Acknowledges education/In group clarification offered/Needs additional education.   Clinical Observations/Feedback: Pt did not attend group.   Victorino Sparrow, LRT/CTRS     Victorino Sparrow A 11/14/2017 12:48 PM

## 2017-11-14 NOTE — Progress Notes (Signed)
Per report from [facility], pt would not eat food for lunch and drank multiple cups of soda.

## 2017-11-14 NOTE — Progress Notes (Signed)
Recreation Therapy Notes  INPATIENT RECREATION THERAPY ASSESSMENT  Patient Details Name: [name] MRN: [id number] DOB: May 29, 1961 Today's Date: 11/14/2017  Patient Stressors: Other (Comment)(Assisted living)  Pt stated he didn't know why he was here.  Coping Skills:   Avoidance, Self-Injury, Talking, Music, Sports  Personal Challenges: Communication, Trusting Others  Leisure Interests (2+):  Music - Listen, Petra Kuba - Fishing, Individual - Other (Comment)(Drinking soda; smoking cigarettes)  Awareness of Community Resources:  Yes  Community Resources:  Other (Comment)(Mental health)  Current Use: Yes  Patient Strengths:  "I don't know"  Patient Identified Areas of Improvement:  Communication  Current Recreation Participation:  Few times a week  Patient Goal for Hospitalization:  "Feel better, get out of here, get medications prescribed to me and get a diabetic machine with test strips".  [place] of Residence:  [place] of Residence:  [place]  Current [place] (including self-harm):  No  Current [place]:  No  Consent to Intern Participation: N/A    Victorino Sparrow, LRT/CTRS  Victorino Sparrow A 11/14/2017, 2:08 PM

## 2017-11-14 NOTE — Progress Notes (Signed)
TC Lurline Idol, [facility] ACT team.  [phone].  She initially said she can pick pt up tomorrow from [facility].  She then called back and said they cannot pick up tomorrow, but could pick up Wednesday.  They have had pt in multiple different living situations: several groups homes, boarding homes, he has left them all.  Their plan for tomorrow/Wednesday would be the homeless shelter.  CSW told her that we will talk tomorrow and get back in touch.  Dr Nancy Fetter informed. Winferd Humphrey, MSW, LCSW Clinical Social Worker 11/14/2017 4:20 PM
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
## 2017-11-15 LAB — GLUCOSE, CAPILLARY: GLUCOSE-CAPILLARY: 189 mg/dL — AB (ref 65–99)

## 2017-11-15 MED ORDER — CLONAZEPAM 0.5 MG PO TABS: 0.5000 mg | ORAL_TABLET | Freq: Three times a day (TID) | ORAL | 0 refills | Status: DC

## 2017-11-15 MED ORDER — ATORVASTATIN CALCIUM 40 MG PO TABS: ORAL_TABLET | ORAL | 0 refills | Status: DC

## 2017-11-15 MED ORDER — GABAPENTIN 400 MG PO CAPS: 400.0000 mg | ORAL_CAPSULE | Freq: Four times a day (QID) | ORAL | 0 refills | Status: DC

## 2017-11-15 MED ORDER — ALBUTEROL SULFATE HFA 108 (90 BASE) MCG/ACT IN AERS: 2.0000 | INHALATION_SPRAY | Freq: Four times a day (QID) | RESPIRATORY_TRACT | 0 refills | Status: DC | PRN

## 2017-11-15 MED ORDER — ZOLPIDEM TARTRATE 5 MG PO TABS: 5.0000 mg | ORAL_TABLET | Freq: Every evening | ORAL | 0 refills | Status: DC | PRN

## 2017-11-15 MED ORDER — IBUPROFEN 800 MG PO TABS: 800.0000 mg | ORAL_TABLET | Freq: Four times a day (QID) | ORAL | 0 refills | Status: DC | PRN

## 2017-11-15 MED ORDER — ASPIRIN EC 81 MG PO TBEC: 81.0000 mg | DELAYED_RELEASE_TABLET | Freq: Every day | ORAL | 0 refills | Status: DC

## 2017-11-15 MED ORDER — PALIPERIDONE PALMITATE 234 MG/1.5ML IM SUSP: 234.0000 mg | INTRAMUSCULAR | 0 refills | Status: DC

## 2017-11-15 MED ORDER — PANTOPRAZOLE SODIUM 40 MG PO TBEC: 40.0000 mg | DELAYED_RELEASE_TABLET | Freq: Every day | ORAL | 0 refills | Status: DC

## 2017-11-15 MED ORDER — LISINOPRIL 20 MG PO TABS: 20.0000 mg | ORAL_TABLET | Freq: Every day | ORAL | 0 refills | Status: DC

## 2017-11-15 MED ORDER — NICOTINE 21 MG/24HR TD PT24: 21.0000 mg | MEDICATED_PATCH | Freq: Every day | TRANSDERMAL | 0 refills | Status: DC

## 2017-11-15 MED ORDER — MUPIROCIN 2 % EX OINT: TOPICAL_OINTMENT | Freq: Two times a day (BID) | CUTANEOUS | 0 refills | Status: DC

## 2017-11-15 MED ORDER — INSULIN NPH (HUMAN) (ISOPHANE) 100 UNIT/ML ~~LOC~~ SUSP: 10.0000 [IU] | Freq: Two times a day (BID) | SUBCUTANEOUS | 0 refills | Status: DC

## 2017-11-15 MED ORDER — ISOSORBIDE DINITRATE 30 MG PO TABS: 30.0000 mg | ORAL_TABLET | Freq: Every day | ORAL | Status: DC

## 2017-11-15 MED ORDER — METFORMIN HCL ER 500 MG PO TB24: 500.0000 mg | ORAL_TABLET | Freq: Two times a day (BID) | ORAL | 0 refills | Status: DC

## 2017-11-15 NOTE — Progress Notes (Signed)
Pt discharged to [facility] via [facility]. Pt was ambulatory, stable and appreciative at that time. All papers and prescriptions were given and valuables returned. Verbal understanding expressed. Denies SI/HI and A/VH. Pt given opportunity to express concerns and ask questions.

## 2017-11-15 NOTE — Progress Notes (Signed)
Recreation Therapy Notes  Date: 11/15/17 Time: 1000 Location: 500 Hall Dayroom  Group Topic: Coping Skills  Goal Area(s) Addresses:  Patient will be able to identify positive coping skills. Patient will be able to identify benefits of coping skills. Patient will be able to identify benefits of using coping skills post d/c.  Intervention: Magazines, scissors, glue sticks, construction paper, worksheet  Activity: Patients were given a worksheet divided into 5 categories (diversions, cognitive, social, tension release and physical).  Patients were to look through the magazines to find pictures to represent coping skills they could use for each category.  Education: Radiographer, therapeutic, Dentist.   Education Outcome: Acknowledges understanding/In group clarification offered/Needs additional education.   Clinical Observations/Feedback: Pt did not attend group.     Victorino Sparrow,  LRT/CTRS    Victorino Sparrow A 11/15/2017 11:31 AM

## 2017-11-15 NOTE — Progress Notes (Signed)
Patient ID: [name], male   DOB: 1961-09-29, 57 y.o.   MRN: [id number] DAR Note: Pt look very unkempt and non-compliant to his diabetes diet, "I will be alright; I eat more than this." Pt however denied any depression, pain AVH, Hi or SI, "I will going home in the morning; I can't wait." Support, encouragement, and safe environment provided. All patient's questions and concerns addressed. 15-minute safety checks continue. Safety checks continue. Pt was med compliant. Pt did not attend wrap-up group.

## 2017-11-15 NOTE — Progress Notes (Signed)
Recreation Therapy Notes  INPATIENT RECREATION TR PLAN  Patient Details Name: [name] MRN: [id number] DOB: [birth year] Today's Date: 11/15/2017  Rec Therapy Plan Is patient appropriate for Therapeutic Recreation?: Yes Treatment times per week: about 3 days Estimated Length of Stay: 5-7 days TR Treatment/Interventions: Group participation (Comment)  Discharge Criteria Pt will be discharged from therapy if:: Discharged Treatment plan/goals/alternatives discussed and agreed upon by:: Patient/family  Discharge Summary Short term goals set: Pt will attend and participate in recreation therapy sessions. Short term goals met: Not met Reason goals not met: Pt did not attend groups. Therapeutic equipment acquired: N/A Reason patient discharged from therapy: Discharge from hospital Pt/family agrees with progress & goals achieved: Yes Date patient discharged from therapy: 11/15/17    Victorino Sparrow, LRT/CTRS  Ria Comment, Quintara Bost A 11/15/2017, 11:21 AM

## 2017-11-15 NOTE — Plan of Care (Signed)
Pt did not attend recreation therapy groups.  [name], LRT/CTRS
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
## 2017-11-15 NOTE — Progress Notes (Signed)
  [facility] Adult Case Management Discharge Plan :  Will you be returning to the same living situation after discharge:  No. Pt choosing to stay with a friend in [place]. At discharge, do you have transportation home?: No. [facility] to transport to [place], ACT team will transport from there to friend's house. Do you have the ability to pay for your medications: Yes,  medicare/medicaid  Release of information consent forms completed and in the chart;  Patient's signature needed at discharge.  Patient to Follow up at: Follow-up Information    [facility] Follow up on 11/15/2017.   Why:  Your ACT team will pick you up from the [facility] and resume services immediately. Contact information: [address] [place] [address] [phone]           Next level of care provider has access to [place] and Suicide Prevention discussed: Yes,  with sister  Have you used any form of tobacco in the last 30 days? (Cigarettes, Smokeless Tobacco, Cigars, and/or Pipes): Yes  Has patient been referred to the Quitline?: Patient refused referral  Patient has been referred for addiction treatment: Yes  [name], [place] 11/15/2017, 10:51 AM

## 2017-11-15 NOTE — [facility] Group Notes (Signed)
Pt did not attend orientation and goals group.

## 2017-11-15 NOTE — Progress Notes (Signed)
Adult Psychoeducational Group Note  Date:  11/15/2017 Time:  3:09 AM  Group Topic/Focus:  Wrap-Up Group:   The focus of this group is to help patients review their daily goal of treatment and discuss progress on daily workbooks.  Participation Level:  Did Not Attend  Participation Quality:  Did Not Attend  Affect:  Did Not Attend  Cognitive:  Did Not Attend  Insight: None  Engagement in Group:  Did Not Attend  Modes of Intervention:  Did Not Attend  Additional Comments:  Pt did not attend evening wrap up group tonight.  Candy Sledge 11/15/2017, 3:09 AM
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
## 2017-11-16 ENCOUNTER — Other Ambulatory Visit: Payer: Self-pay | Admitting: Family Medicine

## 2017-11-16 DIAGNOSIS — J432 Centrilobular emphysema: Secondary | ICD-10-CM

## 2017-11-28 DIAGNOSIS — R69 Illness, unspecified: Secondary | ICD-10-CM | POA: Diagnosis not present

## 2017-11-29 DIAGNOSIS — I10 Essential (primary) hypertension: Secondary | ICD-10-CM | POA: Diagnosis not present

## 2017-11-29 DIAGNOSIS — E119 Type 2 diabetes mellitus without complications: Secondary | ICD-10-CM | POA: Diagnosis not present

## 2017-11-29 DIAGNOSIS — Z7951 Long term (current) use of inhaled steroids: Secondary | ICD-10-CM | POA: Diagnosis not present

## 2017-11-29 DIAGNOSIS — F419 Anxiety disorder, unspecified: Secondary | ICD-10-CM | POA: Diagnosis not present

## 2017-11-29 DIAGNOSIS — Z794 Long term (current) use of insulin: Secondary | ICD-10-CM | POA: Diagnosis not present

## 2017-11-29 DIAGNOSIS — F17200 Nicotine dependence, unspecified, uncomplicated: Secondary | ICD-10-CM | POA: Diagnosis not present

## 2017-11-29 DIAGNOSIS — J449 Chronic obstructive pulmonary disease, unspecified: Secondary | ICD-10-CM | POA: Diagnosis not present

## 2017-11-29 DIAGNOSIS — Z7982 Long term (current) use of aspirin: Secondary | ICD-10-CM | POA: Diagnosis not present

## 2017-11-29 DIAGNOSIS — G8929 Other chronic pain: Secondary | ICD-10-CM | POA: Diagnosis not present

## 2017-11-29 DIAGNOSIS — Z79899 Other long term (current) drug therapy: Secondary | ICD-10-CM | POA: Diagnosis not present

## 2017-11-29 DIAGNOSIS — M25551 Pain in right hip: Secondary | ICD-10-CM | POA: Diagnosis not present

## 2017-12-01 ENCOUNTER — Ambulatory Visit: Payer: Self-pay | Admitting: Family Medicine

## 2017-12-05 DIAGNOSIS — F17200 Nicotine dependence, unspecified, uncomplicated: Secondary | ICD-10-CM | POA: Diagnosis not present

## 2017-12-05 DIAGNOSIS — F419 Anxiety disorder, unspecified: Secondary | ICD-10-CM | POA: Diagnosis not present

## 2017-12-05 DIAGNOSIS — R069 Unspecified abnormalities of breathing: Secondary | ICD-10-CM | POA: Diagnosis not present

## 2017-12-05 DIAGNOSIS — M25551 Pain in right hip: Secondary | ICD-10-CM | POA: Diagnosis not present

## 2017-12-05 DIAGNOSIS — Z7982 Long term (current) use of aspirin: Secondary | ICD-10-CM | POA: Diagnosis not present

## 2017-12-05 DIAGNOSIS — Z9114 Patient's other noncompliance with medication regimen: Secondary | ICD-10-CM | POA: Diagnosis not present

## 2017-12-05 DIAGNOSIS — E119 Type 2 diabetes mellitus without complications: Secondary | ICD-10-CM | POA: Diagnosis not present

## 2017-12-05 DIAGNOSIS — Z9119 Patient's noncompliance with other medical treatment and regimen: Secondary | ICD-10-CM | POA: Diagnosis not present

## 2017-12-05 DIAGNOSIS — R0789 Other chest pain: Secondary | ICD-10-CM | POA: Diagnosis not present

## 2017-12-05 DIAGNOSIS — R05 Cough: Secondary | ICD-10-CM | POA: Diagnosis not present

## 2017-12-05 DIAGNOSIS — R5381 Other malaise: Secondary | ICD-10-CM | POA: Diagnosis not present

## 2017-12-05 DIAGNOSIS — R110 Nausea: Secondary | ICD-10-CM | POA: Diagnosis not present

## 2017-12-05 DIAGNOSIS — Z7951 Long term (current) use of inhaled steroids: Secondary | ICD-10-CM | POA: Diagnosis not present

## 2017-12-05 DIAGNOSIS — Z79899 Other long term (current) drug therapy: Secondary | ICD-10-CM | POA: Diagnosis not present

## 2017-12-05 DIAGNOSIS — J449 Chronic obstructive pulmonary disease, unspecified: Secondary | ICD-10-CM | POA: Diagnosis not present

## 2017-12-05 DIAGNOSIS — I10 Essential (primary) hypertension: Secondary | ICD-10-CM | POA: Diagnosis not present

## 2017-12-05 DIAGNOSIS — Z794 Long term (current) use of insulin: Secondary | ICD-10-CM | POA: Diagnosis not present

## 2017-12-20 DIAGNOSIS — T50902A Poisoning by unspecified drugs, medicaments and biological substances, intentional self-harm, initial encounter: Secondary | ICD-10-CM | POA: Diagnosis not present

## 2017-12-20 DIAGNOSIS — F209 Schizophrenia, unspecified: Secondary | ICD-10-CM | POA: Diagnosis not present

## 2017-12-20 DIAGNOSIS — T464X2A Poisoning by angiotensin-converting-enzyme inhibitors, intentional self-harm, initial encounter: Secondary | ICD-10-CM | POA: Diagnosis not present

## 2017-12-20 DIAGNOSIS — E119 Type 2 diabetes mellitus without complications: Secondary | ICD-10-CM | POA: Diagnosis not present

## 2017-12-20 DIAGNOSIS — T6592XA Toxic effect of unspecified substance, intentional self-harm, initial encounter: Secondary | ICD-10-CM | POA: Diagnosis not present

## 2017-12-20 DIAGNOSIS — J449 Chronic obstructive pulmonary disease, unspecified: Secondary | ICD-10-CM | POA: Diagnosis not present

## 2017-12-20 DIAGNOSIS — Z79899 Other long term (current) drug therapy: Secondary | ICD-10-CM | POA: Diagnosis not present

## 2017-12-20 DIAGNOSIS — Z794 Long term (current) use of insulin: Secondary | ICD-10-CM | POA: Diagnosis not present

## 2017-12-20 DIAGNOSIS — F17210 Nicotine dependence, cigarettes, uncomplicated: Secondary | ICD-10-CM | POA: Diagnosis not present

## 2017-12-20 DIAGNOSIS — Z7951 Long term (current) use of inhaled steroids: Secondary | ICD-10-CM | POA: Diagnosis not present

## 2017-12-20 DIAGNOSIS — I10 Essential (primary) hypertension: Secondary | ICD-10-CM | POA: Diagnosis not present

## 2017-12-20 DIAGNOSIS — Z7982 Long term (current) use of aspirin: Secondary | ICD-10-CM | POA: Diagnosis not present

## 2017-12-27 ENCOUNTER — Emergency Department (HOSPITAL_COMMUNITY)
Admission: EM | Admit: 2017-12-27 | Discharge: 2017-12-28 | Disposition: A | Discharge status: Home / Self Care | Payer: Medicare Other | Attending: Emergency Medicine | Admitting: Emergency Medicine

## 2017-12-27 ENCOUNTER — Encounter (HOSPITAL_COMMUNITY): Payer: Self-pay | Admitting: Emergency Medicine

## 2017-12-27 ENCOUNTER — Emergency Department (HOSPITAL_COMMUNITY): Payer: Medicare Other

## 2017-12-27 DIAGNOSIS — I252 Old myocardial infarction: Secondary | ICD-10-CM | POA: Diagnosis not present

## 2017-12-27 DIAGNOSIS — E119 Type 2 diabetes mellitus without complications: Secondary | ICD-10-CM | POA: Insufficient documentation

## 2017-12-27 DIAGNOSIS — I10 Essential (primary) hypertension: Secondary | ICD-10-CM | POA: Diagnosis not present

## 2017-12-27 DIAGNOSIS — Z79899 Other long term (current) drug therapy: Secondary | ICD-10-CM | POA: Insufficient documentation

## 2017-12-27 DIAGNOSIS — R45851 Suicidal ideations: Secondary | ICD-10-CM | POA: Diagnosis not present

## 2017-12-27 DIAGNOSIS — R140 Abdominal distension (gaseous): Secondary | ICD-10-CM | POA: Diagnosis not present

## 2017-12-27 DIAGNOSIS — R0602 Shortness of breath: Secondary | ICD-10-CM | POA: Diagnosis not present

## 2017-12-27 DIAGNOSIS — R062 Wheezing: Secondary | ICD-10-CM | POA: Diagnosis not present

## 2017-12-27 DIAGNOSIS — F320 Major depressive disorder, single episode, mild: Secondary | ICD-10-CM | POA: Diagnosis not present

## 2017-12-27 DIAGNOSIS — F419 Anxiety disorder, unspecified: Secondary | ICD-10-CM | POA: Diagnosis present

## 2017-12-27 DIAGNOSIS — Z794 Long term (current) use of insulin: Secondary | ICD-10-CM | POA: Diagnosis not present

## 2017-12-27 DIAGNOSIS — J449 Chronic obstructive pulmonary disease, unspecified: Secondary | ICD-10-CM | POA: Diagnosis not present

## 2017-12-27 DIAGNOSIS — F17210 Nicotine dependence, cigarettes, uncomplicated: Secondary | ICD-10-CM | POA: Insufficient documentation

## 2017-12-27 DIAGNOSIS — K2970 Gastritis, unspecified, without bleeding: Secondary | ICD-10-CM | POA: Diagnosis not present

## 2017-12-27 DIAGNOSIS — F418 Other specified anxiety disorders: Secondary | ICD-10-CM | POA: Diagnosis not present

## 2017-12-27 DIAGNOSIS — R1010 Upper abdominal pain, unspecified: Secondary | ICD-10-CM | POA: Diagnosis not present

## 2017-12-27 LAB — CBC WITH DIFFERENTIAL/PLATELET
BASOS ABS: 0 10*3/uL (ref 0.0–0.1)
BASOS PCT: 1 %
EOS ABS: 0.2 10*3/uL (ref 0.0–0.7)
EOS PCT: 6 %
HCT: 41.6 % (ref 39.0–52.0)
Hemoglobin: 14.5 g/dL (ref 13.0–17.0)
Lymphocytes Relative: 35 %
Lymphs Abs: 1.4 10*3/uL (ref 0.7–4.0)
MCH: 32.9 pg (ref 26.0–34.0)
MCHC: 34.9 g/dL (ref 30.0–36.0)
MCV: 94.3 fL (ref 78.0–100.0)
MONO ABS: 0.4 10*3/uL (ref 0.1–1.0)
Monocytes Relative: 10 %
Neutro Abs: 2.1 10*3/uL (ref 1.7–7.7)
Neutrophils Relative %: 50 %
PLATELETS: 142 10*3/uL — AB (ref 150–400)
RBC: 4.41 MIL/uL (ref 4.22–5.81)
RDW: 13.2 % (ref 11.5–15.5)
WBC: 4.2 10*3/uL (ref 4.0–10.5)

## 2017-12-27 LAB — COMPREHENSIVE METABOLIC PANEL
ALT: 19 U/L (ref 17–63)
AST: 15 U/L (ref 15–41)
Albumin: 4 g/dL (ref 3.5–5.0)
Alkaline Phosphatase: 69 U/L (ref 38–126)
Anion gap: 10 (ref 5–15)
BUN: 10 mg/dL (ref 6–20)
CHLORIDE: 94 mmol/L — AB (ref 101–111)
CO2: 21 mmol/L — AB (ref 22–32)
CREATININE: 0.94 mg/dL (ref 0.61–1.24)
Calcium: 8.8 mg/dL — ABNORMAL LOW (ref 8.9–10.3)
GFR calc Af Amer: >60 mL/min (ref >60)
GFR calc non Af Amer: >60 mL/min (ref >60)
Glucose, Bld: 155 mg/dL — ABNORMAL HIGH (ref 65–99)
POTASSIUM: 4.1 mmol/L (ref 3.5–5.1)
SODIUM: 125 mmol/L — AB (ref 135–145)
Total Bilirubin: 0.9 mg/dL (ref 0.3–1.2)
Total Protein: 6.7 g/dL (ref 6.5–8.1)

## 2017-12-27 LAB — URINALYSIS, ROUTINE W REFLEX MICROSCOPIC
Bilirubin Urine: NEGATIVE
GLUCOSE, UA: NEGATIVE mg/dL
Hgb urine dipstick: NEGATIVE
KETONES UR: NEGATIVE mg/dL
LEUKOCYTES UA: NEGATIVE
Nitrite: NEGATIVE
PH: 5 (ref 5.0–8.0)
Protein, ur: NEGATIVE mg/dL
SPECIFIC GRAVITY, URINE: 1.005 (ref 1.005–1.030)

## 2017-12-27 LAB — ETHANOL: Alcohol, Ethyl (B): <10 mg/dL (ref <10)

## 2017-12-27 LAB — RAPID URINE DRUG SCREEN, HOSP PERFORMED
AMPHETAMINES: NOT DETECTED
BENZODIAZEPINES: NOT DETECTED
Barbiturates: NOT DETECTED
COCAINE: NOT DETECTED
Opiates: NOT DETECTED
Tetrahydrocannabinol: NOT DETECTED

## 2017-12-27 LAB — ACETAMINOPHEN LEVEL

## 2017-12-27 LAB — SALICYLATE LEVEL: Salicylate Lvl: <7 mg/dL (ref 2.8–30.0)

## 2017-12-27 LAB — TROPONIN I: Troponin I: <0.03 ng/mL (ref <0.03)

## 2017-12-27 LAB — LIPASE, BLOOD: LIPASE: 37 U/L (ref 11–51)

## 2017-12-27 MED ORDER — MUPIROCIN 2 % EX OINT
TOPICAL_OINTMENT | Freq: Two times a day (BID) | CUTANEOUS | Status: DC
  Filled 2017-12-27: qty 22

## 2017-12-27 MED ORDER — METFORMIN HCL ER 500 MG PO TB24
500.0000 mg | ORAL_TABLET | Freq: Two times a day (BID) | ORAL | Status: DC
  Administered 2017-12-28: 500 mg via ORAL
  Filled 2017-12-27 (×5): qty 1

## 2017-12-27 MED ORDER — PALIPERIDONE PALMITATE 234 MG/1.5ML IM SUSP
234.0000 mg | INTRAMUSCULAR | Status: DC
Start: 2017-12-27 — End: 2017-12-28

## 2017-12-27 MED ORDER — GABAPENTIN 400 MG PO CAPS
400.0000 mg | ORAL_CAPSULE | Freq: Four times a day (QID) | ORAL | Status: DC
  Administered 2017-12-28 (×2): 400 mg via ORAL
  Filled 2017-12-27 (×2): qty 1

## 2017-12-27 MED ORDER — INSULIN NPH (HUMAN) (ISOPHANE) 100 UNIT/ML ~~LOC~~ SUSP
10.0000 [IU] | Freq: Two times a day (BID) | SUBCUTANEOUS | Status: DC
  Administered 2017-12-28: 10 [IU] via SUBCUTANEOUS
  Filled 2017-12-27: qty 10

## 2017-12-27 MED ORDER — ISOSORBIDE DINITRATE 10 MG PO TABS
30.0000 mg | ORAL_TABLET | Freq: Every day | ORAL | Status: DC
  Administered 2017-12-28: 30 mg via ORAL
  Filled 2017-12-27 (×3): qty 1

## 2017-12-27 MED ORDER — ASPIRIN EC 81 MG PO TBEC
81.0000 mg | DELAYED_RELEASE_TABLET | Freq: Every day | ORAL | Status: DC
  Administered 2017-12-28: 81 mg via ORAL
  Filled 2017-12-27: qty 1

## 2017-12-27 MED ORDER — ATORVASTATIN CALCIUM 40 MG PO TABS
40.0000 mg | ORAL_TABLET | Freq: Every day | ORAL | Status: DC
  Filled 2017-12-27 (×2): qty 1

## 2017-12-27 MED ORDER — ALBUTEROL SULFATE HFA 108 (90 BASE) MCG/ACT IN AERS
2.0000 | INHALATION_SPRAY | Freq: Four times a day (QID) | RESPIRATORY_TRACT | Status: DC | PRN
  Filled 2017-12-27: qty 6.7

## 2017-12-27 MED ORDER — NICOTINE 21 MG/24HR TD PT24
21.0000 mg | MEDICATED_PATCH | Freq: Every day | TRANSDERMAL | Status: DC
  Administered 2017-12-28: 21 mg via TRANSDERMAL

## 2017-12-27 MED ORDER — ZOLPIDEM TARTRATE 5 MG PO TABS: 5.0000 mg | ORAL_TABLET | Freq: Every evening | ORAL | Status: DC | PRN

## 2017-12-27 MED ORDER — GI COCKTAIL ~~LOC~~
30.0000 mL | Freq: Once | ORAL | Status: AC
  Administered 2017-12-27: 30 mL via ORAL
  Filled 2017-12-27: qty 30

## 2017-12-27 MED ORDER — CLONAZEPAM 0.5 MG PO TABS
0.5000 mg | ORAL_TABLET | Freq: Three times a day (TID) | ORAL | Status: DC
  Administered 2017-12-28 (×2): 0.5 mg via ORAL
  Filled 2017-12-27 (×2): qty 1

## 2017-12-27 MED ORDER — CLONAZEPAM 0.5 MG PO TABS
0.5000 mg | ORAL_TABLET | Freq: Once | ORAL | Status: AC
  Administered 2017-12-27: 0.5 mg via ORAL
  Filled 2017-12-27: qty 1

## 2017-12-27 MED ORDER — SODIUM CHLORIDE 0.9 % IV BOLUS (SEPSIS): 2000.0000 mL | Freq: Once | INTRAVENOUS | Status: DC

## 2017-12-27 MED ORDER — LISINOPRIL 10 MG PO TABS
20.0000 mg | ORAL_TABLET | Freq: Every day | ORAL | Status: DC
  Administered 2017-12-28: 20 mg via ORAL
  Filled 2017-12-27: qty 2

## 2017-12-27 NOTE — ED Provider Notes (Signed)
Reportedly suicidal and stated he wants to hang himself.  He complained of abdominal pain earlier today but is asymptomatic since receiving GI cocktail while here.  On exam he is alert and in no distress lungs clear to auscultation heart regular rate and rhythm abdomen obese, soft nontender.  The patient has chronic hyponatremia, slightly worse presently than a month ago.  We will hydrate with normal saline, just recheck  electrolytesafter hydration Results for orders placed or performed during the hospital encounter of 12/27/17  CBC with Differential/Platelet  Result Value Ref Range   WBC 4.2 4.0 - 10.5 K/uL   RBC 4.41 4.22 - 5.81 MIL/uL   Hemoglobin 14.5 13.0 - 17.0 g/dL   HCT 41.6 39.0 - 52.0 %   MCV 94.3 78.0 - 100.0 fL   MCH 32.9 26.0 - 34.0 pg   MCHC 34.9 30.0 - 36.0 g/dL   RDW 13.2 11.5 - 15.5 %   Platelets 142 (L) 150 - 400 K/uL   Neutrophils Relative % 50 %   Neutro Abs 2.1 1.7 - 7.7 K/uL   Lymphocytes Relative 35 %   Lymphs Abs 1.4 0.7 - 4.0 K/uL   Monocytes Relative 10 %   Monocytes Absolute 0.4 0.1 - 1.0 K/uL   Eosinophils Relative 6 %   Eosinophils Absolute 0.2 0.0 - 0.7 K/uL   Basophils Relative 1 %   Basophils Absolute 0.0 0.0 - 0.1 K/uL  Comprehensive metabolic panel  Result Value Ref Range   Sodium 125 (L) 135 - 145 mmol/L   Potassium 4.1 3.5 - 5.1 mmol/L   Chloride 94 (L) 101 - 111 mmol/L   CO2 21 (L) 22 - 32 mmol/L   Glucose, Bld 155 (H) 65 - 99 mg/dL   BUN 10 6 - 20 mg/dL   Creatinine, Ser 0.94 0.61 - 1.24 mg/dL   Calcium 8.8 (L) 8.9 - 10.3 mg/dL   Total Protein 6.7 6.5 - 8.1 g/dL   Albumin 4.0 3.5 - 5.0 g/dL   AST 15 15 - 41 U/L   ALT 19 17 - 63 U/L   Alkaline Phosphatase 69 38 - 126 U/L   Total Bilirubin 0.9 0.3 - 1.2 mg/dL   GFR calc non Af Amer >60 >60 mL/min   GFR calc Af Amer >60 >60 mL/min   Anion gap 10 5 - 15  Lipase, blood  Result Value Ref Range   Lipase 37 11 - 51 U/L  Ethanol  Result Value Ref Range   Alcohol, Ethyl (B) <10 <10 mg/dL   Rapid urine drug screen (hospital performed)  Result Value Ref Range   Opiates NONE DETECTED NONE DETECTED   Cocaine NONE DETECTED NONE DETECTED   Benzodiazepines NONE DETECTED NONE DETECTED   Amphetamines NONE DETECTED NONE DETECTED   Tetrahydrocannabinol NONE DETECTED NONE DETECTED   Barbiturates NONE DETECTED NONE DETECTED  Acetaminophen level  Result Value Ref Range   Acetaminophen (Tylenol), Serum <10 (L) 10 - 30 ug/mL  Salicylate level  Result Value Ref Range   Salicylate Lvl <0.8 2.8 - 30.0 mg/dL  Urinalysis, Routine w reflex microscopic  Result Value Ref Range   Color, Urine STRAW (A) YELLOW   APPearance CLEAR CLEAR   Specific Gravity, Urine 1.005 1.005 - 1.030   pH 5.0 5.0 - 8.0   Glucose, UA NEGATIVE NEGATIVE mg/dL   Hgb urine dipstick NEGATIVE NEGATIVE   Bilirubin Urine NEGATIVE NEGATIVE   Ketones, ur NEGATIVE NEGATIVE mg/dL   Protein, ur NEGATIVE NEGATIVE mg/dL   Nitrite  NEGATIVE NEGATIVE   Leukocytes, UA NEGATIVE NEGATIVE  Troponin I  Result Value Ref Range   Troponin I <0.03 <0.03 ng/mL   Dg Abd Acute W/chest  Result Date: 12/27/2017 CLINICAL DATA:  Acute onset of upper abdominal pain. Shortness of breath. EXAM: DG ABDOMEN ACUTE W/ 1V CHEST COMPARISON:  Chest radiograph performed 12/05/2017 FINDINGS: The lungs are well-aerated. Vascular congestion is noted. Increased interstitial markings may reflect mild interstitial edema. There is no evidence of focal opacification, pleural effusion or pneumothorax. The cardiomediastinal silhouette is mildly enlarged. The visualized bowel gas pattern is unremarkable. Scattered stool and air are seen within the colon; there is no evidence of small bowel dilatation to suggest obstruction. No free intra-abdominal air is identified on the provided upright view. No acute osseous abnormalities are seen; the sacroiliac joints are unremarkable in appearance. IMPRESSION: 1. Unremarkable bowel gas pattern; no free intra-abdominal air seen.  Moderate amount of stool noted in the colon. 2. Vascular congestion and mild cardiomegaly. Increased interstitial markings may reflect mild interstitial edema. Electronically Signed   By: Garald Balding M.D.   On: 12/27/2017 22:03  Pt signed out to Dr. Betsey Holiday at 1240 am   [name], MD 12/28/17 [phone]

## 2017-12-27 NOTE — ED Provider Notes (Signed)
[facility] EMERGENCY DEPARTMENT Provider Note   CSN: [id number] Arrival date & time: 12/27/17  2046     History   Chief Complaint Chief Complaint  Patient presents with  . V70.1    HPI [name] is a 57 y.o. male.  HPI Patient presents by EMS with increased suicidal ideation and anxiety.  States he is having thoughts of harming himself either by cutting his wrist, overdose or hang himself.  He also is complaining of upper abdominal pain.  He has been taking ibuprofen daily.  Denies melanotic or grossly bloody stools.  No nausea or vomiting.  Patient also complains of mild wheezing but denies chest pain.  No fever or chills.  History of polysubstance abuse and alcohol dependence. Past Medical History:  Diagnosis Date  . Alcohol dependence ([place]) 1979   stated abusing ETOH at age 12   . Back pain   . Benzodiazepine dependence ([place])   . Cardiac arrest ([place])   . Chronic back pain   . Cocaine abuse ([place])   . COPD (chronic obstructive pulmonary disease) ([place])   . Diabetes mellitus without complication ([place]) 7322  . Hypertension 2008  . MI (myocardial infarction) ([place])   . Non-compliance   . Schizoaffective disorder ([place]) 2006   . Substance abuse [facility])     Patient Active Problem List   Diagnosis Date Noted  . MDD (major depressive disorder) 11/12/2017  . MDD (major depressive disorder), recurrent severe, without psychosis ([place]) 10/12/2017  . Diabetic neuropathy ([place]) 11/11/2016  . Anxiety and depression 11/11/2016  . Chronic insomnia 01/08/2016  . Alcohol use disorder 12/10/2015  . Benzodiazepine abuse ([place]) 12/10/2015  . Opioid abuse ([place]) 12/10/2015  . Constipation 02/06/2015  . BPH (benign prostatic hyperplasia) 01/16/2015  . Vitamin D deficiency 01/16/2015  . Allergic rhinitis 11/19/2014  . Chronic low back pain 11/07/2014  . Onychomycosis of toenail 11/07/2014  . Tinea pedis 11/07/2014  . Psoriasis 10/14/2014  . HTN (hypertension) 10/14/2014  . Diabetes  mellitus type 2, controlled ([place]) 10/14/2014  . COPD (chronic obstructive pulmonary disease) ([place]) 10/14/2014  . Tobacco abuse 10/14/2014  . HLD (hyperlipidemia) 10/14/2014  . Schizoaffective disorder, depressive type ([place]) 10/14/2014    Past Surgical History:  Procedure Laterality Date  . CYST EXCISION         Home Medications    Prior to Admission medications   Medication Sig Start Date End Date Taking? Authorizing Provider  albuterol (PROVENTIL HFA;VENTOLIN HFA) 108 (90 Base) MCG/ACT inhaler Inhale 2 puffs into the lungs every 6 (six) hours as needed for wheezing or shortness of breath. 11/15/17   Lindell Spar I, NP  aspirin EC 81 MG tablet Take 1 tablet (81 mg total) by mouth daily. For heart health 11/15/17   Lindell Spar I, NP  atorvastatin (LIPITOR) 40 MG tablet TAKE 1 TABLET BY MOUTH EVERY DAY: For high Cholesterol 11/15/17   Lindell Spar I, NP  clonazePAM (KLONOPIN) 0.5 MG tablet Take 1 tablet (0.5 mg total) by mouth 3 (three) times daily. For severe anxiety 11/15/17   Lindell Spar I, NP  gabapentin (NEURONTIN) 400 MG capsule Take 1 capsule (400 mg total) by mouth 4 (four) times daily. For agitation 11/15/17   Lindell Spar I, NP  ibuprofen (ADVIL,MOTRIN) 800 MG tablet Take 1 tablet (800 mg total) by mouth every 6 (six) hours as needed (moderate to severe pain). 11/15/17   Lindell Spar I, NP  insulin NPH Human (HUMULIN N,NOVOLIN N) 100 UNIT/ML injection Inject 0.1 mLs (  10 Units total) into the skin 2 (two) times daily at 8 am and 10 pm. For diabetes management 11/15/17   Lindell Spar I, NP  isosorbide dinitrate (ISORDIL) 30 MG tablet Take 1 tablet (30 mg total) by mouth daily. For chest pain 11/15/17   Lindell Spar I, NP  lisinopril (PRINIVIL,ZESTRIL) 20 MG tablet Take 1 tablet (20 mg total) by mouth daily. For high blood pressure 11/15/17   [place], Herbert Pun I, NP  metFORMIN (GLUCOPHAGE-XR) 500 MG 24 hr tablet Take 1 tablet (500 mg total) by mouth 2 (two) times daily with a meal. For diabetes  management 11/15/17   Lindell Spar I, NP  mupirocin ointment (BACTROBAN) 2 % Apply topically 2 (two) times daily. For wound care 11/15/17   Lindell Spar I, NP  nicotine (NICODERM CQ - DOSED IN MG/24 HOURS) 21 mg/24hr patch Place 1 patch (21 mg total) onto the skin daily. (May buy from over the counter): For smoking cessation 11/16/17   Lindell Spar I, NP  paliperidone (INVEGA SUSTENNA) 234 MG/1.5ML SUSP injection Inject 234 mg into the muscle every 30 (thirty) days. (Due on 11-14-17): For mood control 11/15/17   Lindell Spar I, NP  pantoprazole (PROTONIX) 40 MG tablet Take 1 tablet (40 mg total) by mouth daily. For acid reflux 11/15/17   Lindell Spar I, NP  zolpidem (AMBIEN) 5 MG tablet Take 1 tablet (5 mg total) by mouth at bedtime as needed for sleep. 11/15/17   Encarnacion Slates, NP    Family History Family History  Problem Relation Age of Onset  . Heart disease Mother   . Heart disease Father   . Heart disease Maternal Grandmother   . Diabetes Maternal Grandmother     Social History Social History   Tobacco Use  . Smoking status: Current Every Day Smoker    Packs/day: 1.50    Types: Cigarettes  . Smokeless tobacco: Never Used  Substance Use Topics  . Alcohol use: Yes    Alcohol/week: 0.0 oz    Comment: states he drank some the other day   . Drug use: No    Comment: "not right now"     Allergies   Quetiapine; Seroquel [quetiapine fumarate]; Trazodone; Trazodone and nefazodone; and Haldol [haloperidol lactate]   Review of Systems Review of Systems  Constitutional: Negative for chills and fever.  HENT: Negative for sore throat and trouble swallowing.   Eyes: Negative for visual disturbance.  Respiratory: Positive for wheezing. Negative for cough and shortness of breath.   Cardiovascular: Negative for chest pain, palpitations and leg swelling.  Gastrointestinal: Positive for abdominal pain. Negative for constipation, diarrhea, nausea and vomiting.  Genitourinary: Negative for  dysuria, flank pain, frequency and hematuria.  Musculoskeletal: Negative for back pain, myalgias, neck pain and neck stiffness.  Skin: Negative for rash and wound.  Neurological: Negative for dizziness, weakness, light-headedness, numbness and headaches.  Psychiatric/Behavioral: Positive for dysphoric mood and suicidal ideas. The patient is nervous/anxious.   All other systems reviewed and are negative.    Physical Exam Updated Vital Signs BP 122/73 (BP Location: Left Arm)   Pulse 87   Temp 97.8 F (36.6 C) (Oral)   Resp 15   Wt 98.9 kg (218 lb)   SpO2 100%   BMI 30.84 kg/m   Physical Exam  Constitutional: He is oriented to person, place, and time. He appears well-developed and well-nourished. No distress.  HENT:  Head: Normocephalic and atraumatic.  Mouth/Throat: Oropharynx is clear and moist. No oropharyngeal exudate.  Eyes: EOM are normal. Pupils are equal, round, and reactive to light.  Neck: Normal range of motion. Neck supple.  Cardiovascular: Normal rate and regular rhythm. Exam reveals no gallop and no friction rub.  No murmur heard. Pulmonary/Chest: Effort normal. He has wheezes.  Few scattered expiratory wheezes.  No increased work of breathing or obvious respiratory distress.  Abdominal: Soft. Bowel sounds are normal. There is tenderness. There is no rebound and no guarding.  Patient is tender to palpation in the left upper quadrant and epigastric regions.  Has a ventral abdominal hernia which is reducible.  No rebound or guarding.  Musculoskeletal: Normal range of motion. He exhibits no edema or tenderness.  No lower extremity swelling, asymmetry or tenderness.  Distal pulses are 2+.  Lymphadenopathy:    He has no cervical adenopathy.  Neurological: He is alert and oriented to person, place, and time.  Moves all extremities without focal deficit.  Sensation intact.  Skin: Skin is warm and dry. No rash noted. He is not diaphoretic. No erythema.  Psychiatric:    Pressured speech.  Admits to suicidal ideation.  Nursing note and vitals reviewed.    ED Treatments / Results  Labs (all labs ordered are listed, but only abnormal results are displayed) Labs Reviewed  CBC WITH DIFFERENTIAL/PLATELET  COMPREHENSIVE METABOLIC PANEL  LIPASE, BLOOD  ETHANOL  RAPID URINE DRUG SCREEN, HOSP PERFORMED  ACETAMINOPHEN LEVEL  SALICYLATE LEVEL  URINALYSIS, ROUTINE W REFLEX MICROSCOPIC  TROPONIN I    EKG  EKG Interpretation  Date/Time:  Tuesday December 27 2017 21:20:04 EDT Ventricular Rate:  90 PR Interval:  176 QRS Duration: 88 QT Interval:  380 QTC Calculation: 464 R Axis:   125 Text Interpretation:  Normal sinus rhythm Left posterior fascicular block Abnormal ECG Confirmed by Julianne Rice [id number]) on 12/27/2017 9:26:00 PM       Radiology No results found.  Procedures Procedures (including critical care time)  Medications Ordered in ED Medications  gi cocktail (Maalox,Lidocaine,Donnatal) (not administered)  clonazePAM (KLONOPIN) tablet 0.5 mg (not administered)     Initial Impression / Assessment and Plan / ED Course  I have reviewed the triage vital signs and the nursing notes.  Pertinent labs & imaging results that were available during my care of the patient were reviewed by me and considered in my medical decision making (see chart for details).     Patient presents with abdominal pain and suicidal ideation.  Is voluntary at this point.  Suspect abdominal pain is likely due to gastritis.  Will give GI cocktail and screen with abdominal labs and x-ray.  TTS will be consulted when patient is medically cleared. Signed out to oncoming emergency physician pending workup. Final Clinical Impressions(s) / ED Diagnoses   Final diagnoses:  None    ED Discharge Orders    None       Julianne Rice, MD 12/28/17 1525

## 2017-12-27 NOTE — ED Triage Notes (Signed)
Pt c/o  Si/hi ideations. Pt states he wants to hang himself. He also c/o abd pain and anxiety.

## 2017-12-28 ENCOUNTER — Encounter (HOSPITAL_COMMUNITY): Payer: Self-pay | Admitting: Registered Nurse

## 2017-12-28 DIAGNOSIS — F320 Major depressive disorder, single episode, mild: Secondary | ICD-10-CM | POA: Diagnosis not present

## 2017-12-28 LAB — BASIC METABOLIC PANEL
Anion gap: 8 (ref 5–15)
BUN: 10 mg/dL (ref 6–20)
CALCIUM: 9.1 mg/dL (ref 8.9–10.3)
CO2: 26 mmol/L (ref 22–32)
CREATININE: 1.04 mg/dL (ref 0.61–1.24)
Chloride: 98 mmol/L — ABNORMAL LOW (ref 101–111)
GFR calc non Af Amer: >60 mL/min (ref >60)
GLUCOSE: 208 mg/dL — AB (ref 65–99)
Potassium: 4.4 mmol/L (ref 3.5–5.1)
Sodium: 132 mmol/L — ABNORMAL LOW (ref 135–145)

## 2017-12-28 LAB — CBG MONITORING, ED: Glucose-Capillary: 190 mg/dL — ABNORMAL HIGH (ref 65–99)

## 2017-12-28 MED ORDER — ACETAMINOPHEN 325 MG PO TABS
650.0000 mg | ORAL_TABLET | Freq: Once | ORAL | Status: AC
  Administered 2017-12-28: 650 mg via ORAL
  Filled 2017-12-28: qty 2

## 2017-12-28 MED ORDER — CLONAZEPAM 0.5 MG PO TABS: 0.5000 mg | ORAL_TABLET | Freq: Three times a day (TID) | ORAL | 0 refills | Status: DC | PRN

## 2017-12-28 MED ORDER — METOPROLOL TARTRATE 25 MG PO TABS
25.0000 mg | ORAL_TABLET | Freq: Two times a day (BID) | ORAL | 0 refills | Status: DC
Start: 2017-12-28 — End: 2019-05-02

## 2017-12-28 MED ORDER — NICOTINE 21 MG/24HR TD PT24
21.0000 mg | MEDICATED_PATCH | Freq: Once | TRANSDERMAL | Status: DC
  Filled 2017-12-28: qty 1

## 2017-12-28 NOTE — ED Provider Notes (Signed)
Patient is considered medically cleared now.  He can be evaluated by behavioral health and disposition as behavioral health suggest   Milton Ferguson, MD 12/28/17 1147

## 2017-12-28 NOTE — ED Notes (Signed)
Marinus Maw called and reported would be to pick up pt at 1600. Marinus Maw aware that pt will be in waiting room awaiting pickup.   Pt also inquired about running out of klonopin,metoprolol, and albuterol inhaler.  Marinus Maw reported would set up pt appointment with health department to get those medications filled.  Pt, EDP, and Primary RN aware.

## 2017-12-28 NOTE — Discharge Instructions (Signed)
Follow-up as instructed by behavioral health for outpatient psychiatric treatment

## 2017-12-28 NOTE — ED Notes (Signed)
Pt up ambulatory to bathroom without difficulty.  Called for medications from pharmacy.

## 2017-12-28 NOTE — ED Notes (Signed)
Pt denies any SI or HI at this time.

## 2017-12-28 NOTE — ED Notes (Signed)
Pt denies any abdominal pain at this time.  No other complaints.  Pt cooperative.

## 2017-12-28 NOTE — ED Notes (Signed)
Pt attempted to sign signature pad upon discharge but it wasn't working.

## 2017-12-28 NOTE — Consult Note (Signed)
  Tele Assessment   [name], 57 y.o., male patient presented to [facility] with complaints of suicidal ideation.  Patient seen via telepsych by TTS Lorenza [place]) and this provider; chart reviewed and consulted with Dr. Dwyane Dee on 12/28/17.  On evaluation [name] reports that he was having bad stomach pain and suicidal thoughts when he came to the hospital.  States he was given something for his stomach and he is feeling better.  Patient states that he has ACTT team and he is ready to go home.  Patient denies suicidal/self-harm/homicidal ideation/psychosis/paranoia.  Patient gave permission to speak with his ACTT team and states that they will pick him up.    During evaluation [name] is alert/oriented x 4; calm/cooperative with pleasant affect.  He does not appear to be responding to internal/external stimuli or delusional thoughts.  Patient denies suicidal/self-harm/homicidal ideation, psychosis, and paranoia.  Patient answered question appropriately.  Patient psychiatrically cleared   Recommendations:  Continue to follow up with current outpatient psychiatric provider and ACTT team  TTS will contact ACTT team to pick patient up Disposition: No evidence of imminent risk to self or others at present.   Patient does not meet criteria for psychiatric inpatient admission.   Carlton Sweaney B. Wrigley Winborne, NP

## 2017-12-28 NOTE — ED Notes (Addendum)
[facility] called and reported pt is to be discharged. [place] reported contacted pt ACT team and left message regarding pt pickup.   This RN also called ACT team and Pt Counselor and left message.   ACT Team [phone] Marinus Maw [phone]

## 2018-01-02 ENCOUNTER — Encounter (HOSPITAL_COMMUNITY): Payer: Self-pay | Admitting: Emergency Medicine

## 2018-01-02 ENCOUNTER — Emergency Department (HOSPITAL_COMMUNITY)
Admission: EM | Admit: 2018-01-02 | Discharge: 2018-01-03 | Disposition: A | Discharge status: Home / Self Care | Payer: Medicare Other | Attending: Emergency Medicine | Admitting: Emergency Medicine

## 2018-01-02 DIAGNOSIS — R451 Restlessness and agitation: Secondary | ICD-10-CM | POA: Insufficient documentation

## 2018-01-02 DIAGNOSIS — R45850 Homicidal ideations: Secondary | ICD-10-CM | POA: Diagnosis not present

## 2018-01-02 DIAGNOSIS — J449 Chronic obstructive pulmonary disease, unspecified: Secondary | ICD-10-CM | POA: Diagnosis not present

## 2018-01-02 DIAGNOSIS — E1140 Type 2 diabetes mellitus with diabetic neuropathy, unspecified: Secondary | ICD-10-CM | POA: Insufficient documentation

## 2018-01-02 DIAGNOSIS — F329 Major depressive disorder, single episode, unspecified: Secondary | ICD-10-CM | POA: Diagnosis not present

## 2018-01-02 DIAGNOSIS — Z8674 Personal history of sudden cardiac arrest: Secondary | ICD-10-CM | POA: Insufficient documentation

## 2018-01-02 DIAGNOSIS — F1410 Cocaine abuse, uncomplicated: Secondary | ICD-10-CM | POA: Diagnosis not present

## 2018-01-02 DIAGNOSIS — T1491XA Suicide attempt, initial encounter: Secondary | ICD-10-CM | POA: Diagnosis not present

## 2018-01-02 DIAGNOSIS — Z79899 Other long term (current) drug therapy: Secondary | ICD-10-CM | POA: Insufficient documentation

## 2018-01-02 DIAGNOSIS — Z794 Long term (current) use of insulin: Secondary | ICD-10-CM | POA: Insufficient documentation

## 2018-01-02 DIAGNOSIS — I252 Old myocardial infarction: Secondary | ICD-10-CM | POA: Diagnosis not present

## 2018-01-02 DIAGNOSIS — I10 Essential (primary) hypertension: Secondary | ICD-10-CM | POA: Insufficient documentation

## 2018-01-02 DIAGNOSIS — R4589 Other symptoms and signs involving emotional state: Secondary | ICD-10-CM

## 2018-01-02 DIAGNOSIS — F29 Unspecified psychosis not due to a substance or known physiological condition: Secondary | ICD-10-CM | POA: Diagnosis not present

## 2018-01-02 DIAGNOSIS — R45851 Suicidal ideations: Secondary | ICD-10-CM | POA: Diagnosis not present

## 2018-01-02 DIAGNOSIS — F17210 Nicotine dependence, cigarettes, uncomplicated: Secondary | ICD-10-CM | POA: Insufficient documentation

## 2018-01-02 DIAGNOSIS — F259 Schizoaffective disorder, unspecified: Secondary | ICD-10-CM | POA: Insufficient documentation

## 2018-01-02 LAB — I-STAT CHEM 8, ED
BUN: 6 mg/dL (ref 6–20)
CALCIUM ION: 1.17 mmol/L (ref 1.15–1.40)
Chloride: 97 mmol/L — ABNORMAL LOW (ref 101–111)
Creatinine, Ser: 0.9 mg/dL (ref 0.61–1.24)
GLUCOSE: 236 mg/dL — AB (ref 65–99)
HEMATOCRIT: 42 % (ref 39.0–52.0)
Hemoglobin: 14.3 g/dL (ref 13.0–17.0)
Potassium: 4.3 mmol/L (ref 3.5–5.1)
Sodium: 128 mmol/L — ABNORMAL LOW (ref 135–145)
TCO2: 21 mmol/L — AB (ref 22–32)

## 2018-01-02 LAB — COMPREHENSIVE METABOLIC PANEL
ALT: 17 U/L (ref 17–63)
AST: 16 U/L (ref 15–41)
Albumin: 4.2 g/dL (ref 3.5–5.0)
Alkaline Phosphatase: 84 U/L (ref 38–126)
Anion gap: 11 (ref 5–15)
BILIRUBIN TOTAL: 1.1 mg/dL (ref 0.3–1.2)
BUN: 7 mg/dL (ref 6–20)
CO2: 18 mmol/L — ABNORMAL LOW (ref 22–32)
Calcium: 9.1 mg/dL (ref 8.9–10.3)
Chloride: 98 mmol/L — ABNORMAL LOW (ref 101–111)
Creatinine, Ser: 1.05 mg/dL (ref 0.61–1.24)
GFR calc Af Amer: >60 mL/min (ref >60)
GFR calc non Af Amer: >60 mL/min (ref >60)
GLUCOSE: 210 mg/dL — AB (ref 65–99)
POTASSIUM: 4.3 mmol/L (ref 3.5–5.1)
Sodium: 127 mmol/L — ABNORMAL LOW (ref 135–145)
TOTAL PROTEIN: 7.3 g/dL (ref 6.5–8.1)

## 2018-01-02 LAB — URINALYSIS, ROUTINE W REFLEX MICROSCOPIC
BACTERIA UA: NONE SEEN
Bilirubin Urine: NEGATIVE
Glucose, UA: NEGATIVE mg/dL
Ketones, ur: 20 mg/dL — AB
Leukocytes, UA: NEGATIVE
Nitrite: NEGATIVE
PH: 5 (ref 5.0–8.0)
Protein, ur: NEGATIVE mg/dL
RBC / HPF: NONE SEEN RBC/hpf (ref 0–5)
SPECIFIC GRAVITY, URINE: 1.004 — AB (ref 1.005–1.030)
SQUAMOUS EPITHELIAL / LPF: NONE SEEN

## 2018-01-02 LAB — CBC WITH DIFFERENTIAL/PLATELET
BASOS ABS: 0 10*3/uL (ref 0.0–0.1)
Basophils Relative: 1 %
EOS ABS: 0.2 10*3/uL (ref 0.0–0.7)
EOS PCT: 3 %
HCT: 43.8 % (ref 39.0–52.0)
Hemoglobin: 16 g/dL (ref 13.0–17.0)
LYMPHS PCT: 12 %
Lymphs Abs: 0.6 10*3/uL — ABNORMAL LOW (ref 0.7–4.0)
MCH: 33.8 pg (ref 26.0–34.0)
MCHC: 36.5 g/dL — ABNORMAL HIGH (ref 30.0–36.0)
MCV: 92.4 fL (ref 78.0–100.0)
Monocytes Absolute: 0.6 10*3/uL (ref 0.1–1.0)
Monocytes Relative: 10 %
Neutro Abs: 4 10*3/uL (ref 1.7–7.7)
Neutrophils Relative %: 74 %
PLATELETS: 154 10*3/uL (ref 150–400)
RBC: 4.74 MIL/uL (ref 4.22–5.81)
RDW: 12.9 % (ref 11.5–15.5)
WBC: 5.4 10*3/uL (ref 4.0–10.5)

## 2018-01-02 LAB — RAPID URINE DRUG SCREEN, HOSP PERFORMED
AMPHETAMINES: NOT DETECTED
BARBITURATES: NOT DETECTED
BENZODIAZEPINES: NOT DETECTED
Cocaine: POSITIVE — AB
Opiates: NOT DETECTED
Tetrahydrocannabinol: NOT DETECTED

## 2018-01-02 LAB — CBG MONITORING, ED: GLUCOSE-CAPILLARY: 243 mg/dL — AB (ref 65–99)

## 2018-01-02 LAB — ETHANOL: Alcohol, Ethyl (B): <10 mg/dL (ref <10)

## 2018-01-02 MED ORDER — KETOROLAC TROMETHAMINE 30 MG/ML IJ SOLN
30.0000 mg | Freq: Once | INTRAMUSCULAR | Status: AC
  Administered 2018-01-02: 30 mg via INTRAVENOUS
  Filled 2018-01-02: qty 1

## 2018-01-02 MED ORDER — INSULIN NPH (HUMAN) (ISOPHANE) 100 UNIT/ML ~~LOC~~ SUSP
10.0000 [IU] | Freq: Two times a day (BID) | SUBCUTANEOUS | Status: DC
  Administered 2018-01-02 – 2018-01-03 (×2): 10 [IU] via SUBCUTANEOUS
  Filled 2018-01-02 (×2): qty 10

## 2018-01-02 MED ORDER — GABAPENTIN 400 MG PO CAPS
800.0000 mg | ORAL_CAPSULE | Freq: Four times a day (QID) | ORAL | Status: DC
  Administered 2018-01-02 – 2018-01-03 (×3): 800 mg via ORAL
  Filled 2018-01-02 (×3): qty 2

## 2018-01-02 MED ORDER — CLONAZEPAM 0.5 MG PO TABS
0.5000 mg | ORAL_TABLET | Freq: Three times a day (TID) | ORAL | Status: DC | PRN
  Administered 2018-01-02 – 2018-01-03 (×2): 0.5 mg via ORAL
  Filled 2018-01-02 (×2): qty 1

## 2018-01-02 MED ORDER — METOPROLOL TARTRATE 25 MG PO TABS
25.0000 mg | ORAL_TABLET | Freq: Two times a day (BID) | ORAL | Status: DC
  Administered 2018-01-02 – 2018-01-03 (×2): 25 mg via ORAL
  Filled 2018-01-02 (×2): qty 1

## 2018-01-02 MED ORDER — RISPERIDONE 0.5 MG PO TABS
0.5000 mg | ORAL_TABLET | Freq: Every day | ORAL | Status: DC
  Administered 2018-01-02: 0.5 mg via ORAL
  Filled 2018-01-02 (×3): qty 1

## 2018-01-02 MED ORDER — SODIUM CHLORIDE 0.9 % IV BOLUS (SEPSIS)
1000.0000 mL | Freq: Once | INTRAVENOUS | Status: AC
  Administered 2018-01-02: 1000 mL via INTRAVENOUS

## 2018-01-02 MED ORDER — ISOSORBIDE DINITRATE 30 MG PO TABS
30.0000 mg | ORAL_TABLET | Freq: Every day | ORAL | Status: DC
Start: 2018-01-02 — End: 2018-01-03
  Administered 2018-01-02: 30 mg via ORAL
  Filled 2018-01-02 (×3): qty 1

## 2018-01-02 MED ORDER — ONDANSETRON HCL 4 MG/2ML IJ SOLN
4.0000 mg | Freq: Once | INTRAMUSCULAR | Status: AC
  Administered 2018-01-02: 4 mg via INTRAVENOUS
  Filled 2018-01-02: qty 2

## 2018-01-02 MED ORDER — METFORMIN HCL ER 500 MG PO TB24
500.0000 mg | ORAL_TABLET | Freq: Two times a day (BID) | ORAL | Status: DC
Start: 2018-01-02 — End: 2018-01-03
  Administered 2018-01-02 – 2018-01-03 (×2): 500 mg via ORAL
  Filled 2018-01-02 (×4): qty 1

## 2018-01-02 MED ORDER — ESCITALOPRAM OXALATE 10 MG PO TABS
20.0000 mg | ORAL_TABLET | Freq: Every day | ORAL | Status: DC
  Administered 2018-01-02 – 2018-01-03 (×2): 20 mg via ORAL
  Filled 2018-01-02 (×3): qty 1
  Filled 2018-01-02: qty 2

## 2018-01-02 MED ORDER — PANTOPRAZOLE SODIUM 40 MG PO TBEC
40.0000 mg | DELAYED_RELEASE_TABLET | Freq: Every day | ORAL | Status: DC
  Administered 2018-01-02 – 2018-01-03 (×2): 40 mg via ORAL
  Filled 2018-01-02 (×2): qty 1

## 2018-01-02 MED ORDER — ZOLPIDEM TARTRATE 5 MG PO TABS
5.0000 mg | ORAL_TABLET | Freq: Every evening | ORAL | Status: DC | PRN
Start: 2018-01-02 — End: 2018-01-03

## 2018-01-02 MED ORDER — LISINOPRIL 10 MG PO TABS
10.0000 mg | ORAL_TABLET | Freq: Every day | ORAL | Status: DC
  Administered 2018-01-02 – 2018-01-03 (×2): 10 mg via ORAL
  Filled 2018-01-02 (×2): qty 1

## 2018-01-02 MED ORDER — IBUPROFEN 800 MG PO TABS
800.0000 mg | ORAL_TABLET | Freq: Four times a day (QID) | ORAL | Status: DC | PRN
  Administered 2018-01-02 – 2018-01-03 (×2): 800 mg via ORAL
  Filled 2018-01-02 (×2): qty 1

## 2018-01-02 MED ORDER — ATORVASTATIN CALCIUM 40 MG PO TABS
40.0000 mg | ORAL_TABLET | Freq: Every day | ORAL | Status: DC
  Administered 2018-01-02: 40 mg via ORAL
  Filled 2018-01-02 (×2): qty 1

## 2018-01-02 NOTE — ED Provider Notes (Signed)
[facility] EMERGENCY DEPARTMENT Provider Note   CSN: [id number] Arrival date & time: 01/02/18  1457     History   Chief Complaint Chief Complaint  Patient presents with  . V70.1    HPI [name] is a 57 y.o. male.   Mental Health Problem  Presenting symptoms: agitation, depression, homicidal ideas, suicidal thoughts and suicidal threats   Degree of incapacity (severity):  Mild Onset quality:  Gradual Duration:  3 days Timing:  Constant Progression:  Worsening Chronicity:  Recurrent Context: noncompliance   Context: not alcohol use and not drug abuse   Treatment compliance:  Untreated Relieved by:  None tried Ineffective treatments:  None tried Associated symptoms: appetite change and headaches     Past Medical History:  Diagnosis Date  . Alcohol dependence ([place]) 1979   stated abusing ETOH at age 68   . Back pain   . Benzodiazepine dependence ([place])   . Cardiac arrest ([place])   . Chronic back pain   . Cocaine abuse ([place])   . COPD (chronic obstructive pulmonary disease) ([place])   . Diabetes mellitus without complication ([place]) 4098  . Hypertension 2008  . MI (myocardial infarction) ([place])   . Non-compliance   . Schizoaffective disorder ([place]) 2006   . Substance abuse [facility])     Patient Active Problem List   Diagnosis Date Noted  . MDD (major depressive disorder) 11/12/2017  . 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 Diabetic neuropathy ([place]) 11/11/2016  . Anxiety and depression 11/11/2016  . Chronic insomnia 01/08/2016  . Alcohol use disorder 12/10/2015  . Benzodiazepine abuse ([place]) 12/10/2015  . Opioid abuse ([place]) 12/10/2015  . Constipation 02/06/2015  . BPH (benign prostatic hyperplasia) 01/16/2015  . Vitamin D deficiency 01/16/2015  . Allergic rhinitis 11/19/2014  . Chronic low back pain 11/07/2014  . Onychomycosis of toenail 11/07/2014  . Tinea pedis 11/07/2014  . Psoriasis 10/14/2014  . HTN  (hypertension) 10/14/2014  . Diabetes mellitus type 2, controlled ([place]) 10/14/2014  . COPD (chronic obstructive pulmonary disease) ([place]) 10/14/2014  . Tobacco abuse 10/14/2014  . HLD (hyperlipidemia) 10/14/2014  . Schizoaffective disorder, depressive type ([place]) 10/14/2014    Past Surgical History:  Procedure Laterality Date  . CYST EXCISION         Home Medications    Prior to Admission medications   Medication Sig Start Date End Date Taking? Authorizing Provider  ADVAIR DISKUS 100-50 MCG/DOSE AEPB Inhale 1 puff into the lungs 2 (two) times daily. 12/12/17  Yes [provider]  albuterol (PROVENTIL HFA;VENTOLIN HFA) 108 (90 Base) MCG/ACT inhaler Inhale 2 puffs into the lungs every 6 (six) hours as needed for wheezing or shortness of breath. 11/15/17  Yes Nwoko, Agnes I, NP  atorvastatin (LIPITOR) 40 MG tablet TAKE 1 TABLET BY MOUTH EVERY DAY: For high Cholesterol 11/15/17  Yes Nwoko, Agnes I, NP  clonazePAM (KLONOPIN) 0.5 MG tablet Take 1 tablet (0.5 mg total) by mouth 3 (three) times daily as needed for anxiety. 12/28/17  Yes Milton Ferguson, MD  escitalopram (LEXAPRO) 20 MG tablet Take 20 mg by mouth daily.   Yes [provider]  gabapentin (NEURONTIN) 800 MG tablet Take 800 mg by mouth 4 (four) times daily. 12/08/17  Yes [provider]  ibuprofen (ADVIL,MOTRIN) 800 MG tablet Take 1 tablet (800 mg total) by mouth every 6 (six) hours as needed (moderate to severe pain). 11/15/17  Yes Lindell Spar I, NP  insulin NPH Human (HUMULIN N,NOVOLIN  N) 100 UNIT/ML injection Inject 0.1 mLs (10 Units total) into the skin 2 (two) times daily at 8 am and 10 pm. For diabetes management 11/15/17  Yes Lindell Spar I, NP  isosorbide dinitrate (ISORDIL) 30 MG tablet Take 1 tablet (30 mg total) by mouth daily. For chest pain 11/15/17  Yes Lindell Spar I, NP  lisinopril (PRINIVIL,ZESTRIL) 10 MG tablet Take 1 tablet by mouth daily. 12/12/17  Yes [provider]  metFORMIN  (GLUCOPHAGE-XR) 500 MG 24 hr tablet Take 1 tablet (500 mg total) by mouth 2 (two) times daily with a meal. For diabetes management 11/15/17  Yes Lindell Spar I, NP  metoprolol tartrate (LOPRESSOR) 25 MG tablet Take 1 tablet (25 mg total) by mouth 2 (two) times daily. 12/28/17  Yes Milton Ferguson, MD  paliperidone (INVEGA SUSTENNA) 234 MG/1.5ML SUSP injection Inject 234 mg into the muscle every 30 (thirty) days. (Due on 11-14-17): For mood control 11/15/17  Yes Nwoko, Herbert Pun I, NP  pantoprazole (PROTONIX) 40 MG tablet Take 1 tablet (40 mg total) by mouth daily. For acid reflux 11/15/17  Yes Lindell Spar I, NP  risperiDONE (RISPERDAL) 0.5 MG tablet Take 0.5 mg by mouth at bedtime.   Yes [provider]  zolpidem (AMBIEN) 5 MG tablet Take 1 tablet (5 mg total) by mouth at bedtime as needed for sleep. 11/15/17  Yes Encarnacion Slates, NP    Family History Family History  Problem Relation Age of Onset  . Heart disease Mother   . Heart disease Father   . Heart disease Maternal Grandmother   . Diabetes Maternal Grandmother     Social History Social History   Tobacco Use  . Smoking status: Current Every Day Smoker    Packs/day: 1.50    Types: Cigarettes  . Smokeless tobacco: Never Used  Substance Use Topics  . Alcohol use: Yes    Alcohol/week: 0.0 oz  . Drug use: Yes    Types: Cocaine     Allergies   Quetiapine; Seroquel [quetiapine fumarate]; Trazodone; Trazodone and nefazodone; and Haldol [haloperidol lactate]   Review of Systems Review of Systems  Constitutional: Positive for appetite change.  Gastrointestinal: Positive for nausea. Negative for vomiting.  Neurological: Positive for headaches.  Psychiatric/Behavioral: Positive for agitation, homicidal ideas and suicidal ideas.  All other systems reviewed and are negative.    Physical Exam Updated Vital Signs BP (!) 175/103 (BP Location: Left Arm)   Pulse 95   Temp 98.2 F (36.8 C) (Oral)   Resp 18   Wt 98.9 kg (218 lb)    SpO2 99%   BMI 30.84 kg/m   Physical Exam  Constitutional: He appears well-developed and well-nourished.  HENT:  Head: Normocephalic and atraumatic.  Mouth/Throat: Mucous membranes are dry.  Eyes: Conjunctivae and EOM are normal.  Neck: Normal range of motion.  Cardiovascular: Normal rate.  Pulmonary/Chest: Effort normal. No respiratory distress.  Abdominal: Soft. Bowel sounds are normal. He exhibits no distension.  Musculoskeletal: Normal range of motion. He exhibits no edema.  Neurological: He is alert.  Skin: Skin is warm and dry.  Nursing note and vitals reviewed.    ED Treatments / Results  Labs (all labs ordered are listed, but only abnormal results are displayed) Labs Reviewed  COMPREHENSIVE METABOLIC PANEL - Abnormal; Notable for the following components:      Result Value   Sodium 127 (*)    Chloride 98 (*)    CO2 18 (*)    Glucose, Bld 210 (*)  All other components within normal limits  RAPID URINE DRUG SCREEN, HOSP PERFORMED - Abnormal; Notable for the following components:   Cocaine POSITIVE (*)    All other components within normal limits  CBC WITH DIFFERENTIAL/PLATELET - Abnormal; Notable for the following components:   MCHC 36.5 (*)    Lymphs Abs 0.6 (*)    All other components within normal limits  URINALYSIS, ROUTINE W REFLEX MICROSCOPIC - Abnormal; Notable for the following components:   Color, Urine STRAW (*)    Specific Gravity, Urine 1.004 (*)    Hgb urine dipstick SMALL (*)    Ketones, ur 20 (*)    All other components within normal limits  I-STAT CHEM 8, ED - Abnormal; Notable for the following components:   Sodium 128 (*)    Chloride 97 (*)    Glucose, Bld 236 (*)    TCO2 21 (*)    All other components within normal limits  CBG MONITORING, ED - Abnormal; Notable for the following components:   Glucose-Capillary 243 (*)    All other components within normal limits  ETHANOL    EKG  EKG Interpretation None       Radiology No  results found.  Procedures Procedures (including critical care time)  Medications Ordered in ED Medications  atorvastatin (LIPITOR) tablet 40 mg (40 mg Oral Given 01/02/18 2135)  clonazePAM (KLONOPIN) tablet 0.5 mg (0.5 mg Oral Given 01/02/18 2149)  escitalopram (LEXAPRO) tablet 20 mg (20 mg Oral Given 01/02/18 2135)  gabapentin (NEURONTIN) capsule 800 mg (800 mg Oral Given 01/02/18 2026)  ibuprofen (ADVIL,MOTRIN) tablet 800 mg (800 mg Oral Given 01/02/18 2134)  insulin NPH Human (HUMULIN N,NOVOLIN N) injection 10 Units (10 Units Subcutaneous Given 01/02/18 2134)  isosorbide dinitrate (ISORDIL) tablet 30 mg (30 mg Oral Given 01/02/18 2134)  lisinopril (PRINIVIL,ZESTRIL) tablet 10 mg (10 mg Oral Given 01/02/18 2026)  metFORMIN (GLUCOPHAGE-XR) 24 hr tablet 500 mg (500 mg Oral Given 01/02/18 2135)  metoprolol tartrate (LOPRESSOR) tablet 25 mg (25 mg Oral Given 01/02/18 2026)  pantoprazole (PROTONIX) EC tablet 40 mg (40 mg Oral Given 01/02/18 2027)  risperiDONE (RISPERDAL) tablet 0.5 mg (0.5 mg Oral Given 01/02/18 2135)  zolpidem (AMBIEN) tablet 5 mg (not administered)  sodium chloride 0.9 % bolus 1,000 mL (0 mLs Intravenous Stopped 01/02/18 1922)  ketorolac (TORADOL) 30 MG/ML injection 30 mg (30 mg Intravenous Given 01/02/18 1639)  ondansetron (ZOFRAN) injection 4 mg (4 mg Intravenous Given 01/02/18 1639)  sodium chloride 0.9 % bolus 1,000 mL (0 mLs Intravenous Stopped 01/02/18 1750)     Initial Impression / Assessment and Plan / ED Course  I have reviewed the triage vital signs and the nursing notes.  Pertinent labs & imaging results that were available during my care of the patient were reviewed by me and considered in my medical decision making (see chart for details).     Suspect mild dehydration. Will give fluids, labs and reevaluate.   Labs c/w same, will give another bolus, recheck before medical clearance for TTS eval.  Improved HR and labs with fluids. Improved ha. Medically cleared for  tts consultation.   Final Clinical Impressions(s) / ED Diagnoses   Final diagnoses:  None     Wisdom Seybold, Corene Cornea, MD 01/02/18 2229

## 2018-01-02 NOTE — ED Triage Notes (Signed)
Pt reports he is suicidal and homicidal with a headache and anxiety.  Here 3 days ago for same thing and was d/c.  States he wants to hang himself and he doesn't want to hurt anyone in particular.

## 2018-01-03 DIAGNOSIS — F1490 Cocaine use, unspecified, uncomplicated: Secondary | ICD-10-CM

## 2018-01-03 DIAGNOSIS — F331 Major depressive disorder, recurrent, moderate: Secondary | ICD-10-CM

## 2018-01-03 DIAGNOSIS — F1410 Cocaine abuse, uncomplicated: Secondary | ICD-10-CM | POA: Diagnosis not present

## 2018-01-03 DIAGNOSIS — F17210 Nicotine dependence, cigarettes, uncomplicated: Secondary | ICD-10-CM

## 2018-01-03 LAB — CBG MONITORING, ED: GLUCOSE-CAPILLARY: 165 mg/dL — AB (ref 65–99)

## 2018-01-03 MED ORDER — ISOSORBIDE DINITRATE 10 MG PO TABS
30.0000 mg | ORAL_TABLET | Freq: Every day | ORAL | Status: DC
  Administered 2018-01-03: 30 mg via ORAL
  Filled 2018-01-03 (×2): qty 3

## 2018-01-03 NOTE — Discharge Instructions (Signed)
Follow-up for psychiatric treatment, with the counselor of your choice.  The assessment and crisis team will help you.

## 2018-01-03 NOTE — ED Provider Notes (Signed)
The patient is now being evaluated for stability for discharge.  He has been seen by TTS who feel like he is stable and does not require hospitalization.  Nursing has talked to his outpatient ACT contact, named Marinus Maw.  Marinus Maw states that the patient is typically drug-seeking, and should not be prescribed Klonopin.  At this time the patient is alert and cooperative and able to give history.  He states that he was depressed about his mother's death so he "bought a rock of cocaine to feel better."  Initially the cocaine help but then he felt even worse.  At this time he requests prescriptions for Klonopin and gabapentin.  I advised him to see his doctor for that and he stated that he did not have a doctor.  At that point I encouraged him to find one.  Patient is stable for discharge with outpatient management.  He would likely benefit from additional psychiatric outpatient care.  This can be arranged by his ACT.   Daleen Bo, MD 01/03/18 [phone]

## 2018-01-03 NOTE — Progress Notes (Signed)
Patient has been assessed by the [facility] Physician Extender, Hughie Closs, NP.  Patient does not meet inpatient criteria and discharge is recommended.  Almyra Free @[facility]  ED notified  Areatha Keas. Judi Cong, MSW, [place] Disposition Clinical Social Work [phone] (cell) [phone] (office)

## 2018-01-03 NOTE — ED Notes (Signed)
Pt denies a headache at this time.  Denies any SI or HI. Pt very cooperative.

## 2018-01-03 NOTE — ED Notes (Signed)
Pt given breakfast tray
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
## 2018-01-03 NOTE — ED Notes (Signed)
No signature pad in room for pt to sign.  Pt left in no distress with no questions.

## 2018-01-03 NOTE — Consult Note (Signed)
Patient to be discharged home, detailed note to follow.

## 2018-01-03 NOTE — ED Notes (Signed)
Patient is refusing to get a set of vitals, patient states '"it gets to tight on my arm".

## 2018-01-03 NOTE — ED Notes (Signed)
TTS Consult at Bedside 

## 2018-01-03 NOTE — Consult Note (Addendum)
Telepsych Consultation   Reason for Consult: Depression Referring Physician:  Dr. Merrily Pew Location of Patient: [facility] Location of Provider: [facility]  Patient Identification: [name] MRN:  [id number] Principal Diagnosis: <principal problem not specified> Diagnosis:   Patient Active Problem List   Diagnosis Date Noted  . MDD (major depressive disorder) [F32.9] 11/12/2017  . MDD (major depressive disorder), recurrent severe, without psychosis ([place]) [F33.2] 10/12/2017  . Diabetic neuropathy ([place]) [E11.40] 11/11/2016  . Anxiety and depression [F41.9, F32.9] 11/11/2016  . Chronic insomnia [F51.04] 01/08/2016  . Alcohol use disorder [IMO0002] 12/10/2015  . Benzodiazepine abuse ([place]) [F13.10] 12/10/2015  . Opioid abuse ([place]) [F11.10] 12/10/2015  . Constipation [K59.00] 02/06/2015  . BPH (benign prostatic hyperplasia) [N40.0] 01/16/2015  . Vitamin D deficiency [E55.9] 01/16/2015  . Allergic rhinitis [J30.9] 11/19/2014  . Chronic low back pain [M54.5, G89.29] 11/07/2014  . Onychomycosis of toenail [B35.1] 11/07/2014  . Tinea pedis [B35.3] 11/07/2014  . Psoriasis [L40.9] 10/14/2014  . HTN (hypertension) [I10] 10/14/2014  . Diabetes mellitus type 2, controlled ([place]) [E11.9] 10/14/2014  . COPD (chronic obstructive pulmonary disease) ([place]) [J44.9] 10/14/2014  . Tobacco abuse [Z72.0] 10/14/2014  . HLD (hyperlipidemia) [E78.5] 10/14/2014  . Schizoaffective disorder, depressive type ([place]) [F25.1] 10/14/2014    Total Time spent with patient: 30 minutes  Subjective:   [name] is a 57 y.o. male patient admitted with Cocaine use d/o moderate; MDD recurrent moderate.  HPI: Per the TTS assessment completed on 01/03/18 by Daylene Posey: [name] is an 57 y.o. male.  -Patient called EMS because he had a bad headache.  He also is suicidal with depression and anxiety.  Had a plan to hang himself.    Patient says he is out of his medication.  Specifically he  wants to get back on his clonopin.  He says that [facility] in [place] is not prescribing it for him.  He says it helps him with his anxiety and helps him think clearly.  Patient says that he is not as suicidal as when he came in this evening.  Patient had a plan to hang himself.  He says he got his medication this evening and now feels better.  Patient denies any desire to harm anyone at this time.  He denies any A/V hallucinations.  Patient says he uses crack cocaine.  At first he says he had not used any since a week ago.  Then he admitted to smoking crack within the last two days.  Patient has had previous suicide attempts.  His mother died a year ago today he reports.  Patient lives with relatives but says he is supposed to be moving into a group home once he gets a TB skin test.    Pt has had inpatient care at [facility] in the last year.  He goes to [facility] in [place].    On Exam: Patient was seen via tele-psych, chart reviewed with treatment team. Patient in bed, awake, alert and oriented x4. Patient reiterated the reason for this hospital admission as documented above. Patient stated, "I feel better today and I feel I can go home with my medications". Patient stated that he came to the hospital because he was having headache and was sick in his stomach so he was having suicide ideations. He reports feeling better today after restarting his mediations in the hospital. He currently denies any suicide or homicide ideations as well as visual or auditory hallucinations. He stated that he had ran out  of some of his medications because he missed his appointment. He stated that he will go home and follow up with his psychiatrist to get back on his medications. Patient denies any other concerns at this time. He lives with his sister and feels safe at home. There is no indications that patient is responding to internal stimuli during this assessment. Patient contracts for safety.  Past Psychiatric History:  As in H&P  Risk to Self: Suicidal Ideation: No-Not Currently/Within Last 6 Months Suicidal Intent: No-Not Currently/Within Last 6 Months Is patient at risk for suicide?: Yes Suicidal Plan?: No-Not Currently/Within Last 6 Months Specify Current Suicidal Plan: Hang self Access to Means: No Specify Access to Suicidal Means: Rope What has been your use of drugs/alcohol within the last 12 months?: Cocaine How many times?: (2-3) Other Self Harm Risks: None Triggers for Past Attempts: Unpredictable Intentional Self Injurious Behavior: None Risk to Others: Homicidal Ideation: No-Not Currently/Within Last 6 Months Thoughts of Harm to Others: No Comment - Thoughts of Harm to Others: None Current Homicidal Intent: No Current Homicidal Plan: No Access to Homicidal Means: No Identified Victim: No one History of harm to others?: No Assessment of Violence: None Noted Violent Behavior Description: Pt denies Does patient have access to weapons?: No Criminal Charges Pending?: No Does patient have a court date: No Prior Inpatient Therapy: Prior Inpatient Therapy: Yes Prior Therapy Dates: 10/2017, multiple admits Prior Therapy Facilty/Provider(s): [facility], other facilities Reason for Treatment: Depression Prior Outpatient Therapy: Prior Outpatient Therapy: Yes Prior Therapy Dates: ongoing Prior Therapy Facilty/Provider(s): [facility] in [place] Reason for Treatment: depression Does patient have an ACCT team?: No Does patient have Intensive In-House Services?  : No Does patient have [facility] services? : No Does patient have P4[facility] services?: No  Past Medical History:  Past Medical History:  Diagnosis Date  . Alcohol dependence ([place]) 1979   stated abusing ETOH at age 58   . Back pain   . Benzodiazepine dependence ([place])   . Cardiac arrest ([place])   . Chronic back pain   . Cocaine abuse ([place])   . COPD (chronic obstructive pulmonary disease) ([place])   . Diabetes mellitus without complication ([place])  1607  . Hypertension 2008  . MI (myocardial infarction) ([place])   . Non-compliance   . Schizoaffective disorder ([place]) 2006   . Substance abuse [facility])     Past Surgical History:  Procedure Laterality Date  . CYST EXCISION     Family History:  Family History  Problem Relation Age of Onset  . Heart disease Mother   . Heart disease Father   . Heart disease Maternal Grandmother   . Diabetes Maternal Grandmother    Family Psychiatric  History: Unknown Social History:  Social History   Substance and Sexual Activity  Alcohol Use Yes  . Alcohol/week: 0.0 oz     Social History   Substance and Sexual Activity  Drug Use Yes  . Types: Cocaine    Social History   Socioeconomic History  . Marital status: Single    Spouse name: None  . Number of children: None  . Years of education: None  . Highest education level: None  Social Needs  . Financial resource strain: None  . Food insecurity - worry: None  . Food insecurity - inability: None  . Transportation needs - medical: None  . Transportation needs - non-medical: None  Occupational History  . None  Tobacco Use  . Smoking status: Current Every Day Smoker    Packs/day:  1.50    Types: Cigarettes  . Smokeless tobacco: Never Used  Substance and Sexual Activity  . Alcohol use: Yes    Alcohol/week: 0.0 oz  . Drug use: Yes    Types: Cocaine  . Sexual activity: No  Other Topics Concern  . None  Social History Narrative  . None   Additional Social History:    Allergies:   Allergies  Allergen Reactions  . Quetiapine Anaphylaxis  . Seroquel [Quetiapine Fumarate] Anxiety and Other (See Comments)    Reaction:  Nightmares   . Trazodone Anaphylaxis  . Trazodone And Nefazodone Anxiety and Other (See Comments)    Other reaction(s): Unknown Nightmares Reaction:  Nightmares   . Haldol [Haloperidol Lactate] Anxiety and Other (See Comments)    Reaction:  Nightmares     Labs:  Results for orders placed or performed during  the hospital encounter of 01/02/18 (from the past 48 hour(s))  Urine rapid drug screen (hosp performed)     Status: Abnormal   Collection Time: 01/02/18  4:10 PM  Result Value Ref Range   Opiates NONE DETECTED NONE DETECTED   Cocaine POSITIVE (A) NONE DETECTED   Benzodiazepines NONE DETECTED NONE DETECTED   Amphetamines NONE DETECTED NONE DETECTED   Tetrahydrocannabinol NONE DETECTED NONE DETECTED   Barbiturates NONE DETECTED NONE DETECTED    Comment: (NOTE) DRUG SCREEN FOR MEDICAL PURPOSES ONLY.  IF CONFIRMATION IS NEEDED FOR ANY PURPOSE, NOTIFY LAB WITHIN 5 DAYS. LOWEST DETECTABLE LIMITS FOR URINE DRUG SCREEN Drug Class                     Cutoff (ng/mL) Amphetamine and metabolites    1000 Barbiturate and metabolites    200 Benzodiazepine                 865 Tricyclics and metabolites     300 Opiates and metabolites        300 Cocaine and metabolites        300 THC                            50 Performed at [facility], [address], [place], [place] [address]   Urinalysis, Routine w reflex microscopic     Status: Abnormal   Collection Time: 01/02/18  4:10 PM  Result Value Ref Range   Color, Urine STRAW (A) YELLOW   APPearance CLEAR CLEAR   Specific Gravity, Urine 1.004 (L) 1.005 - 1.030   pH 5.0 5.0 - 8.0   Glucose, UA NEGATIVE NEGATIVE mg/dL   Hgb urine dipstick SMALL (A) NEGATIVE   Bilirubin Urine NEGATIVE NEGATIVE   Ketones, ur 20 (A) NEGATIVE mg/dL   Protein, ur NEGATIVE NEGATIVE mg/dL   Nitrite NEGATIVE NEGATIVE   Leukocytes, UA NEGATIVE NEGATIVE   RBC / HPF NONE SEEN 0 - 5 RBC/hpf   WBC, UA 0-5 0 - 5 WBC/hpf   Bacteria, UA NONE SEEN NONE SEEN   Squamous Epithelial / LPF NONE SEEN NONE SEEN    Comment: Performed at [facility], [address] [place],  [address]  Comprehensive metabolic panel     Status: Abnormal   Collection Time: 01/02/18  4:30 PM  Result Value Ref Range   Sodium 127 (L) 135 - 145 mmol/L   Potassium 4.3 3.5 - 5.1 mmol/L    Chloride 98 (L) 101 - 111 mmol/L   CO2 18 (L) 22 - 32 mmol/L   Glucose,  Bld 210 (H) 65 - 99 mg/dL   BUN 7 6 - 20 mg/dL   Creatinine, Ser 1.05 0.61 - 1.24 mg/dL   Calcium 9.1 8.9 - 10.3 mg/dL   Total Protein 7.3 6.5 - 8.1 g/dL   Albumin 4.2 3.5 - 5.0 g/dL   AST 16 15 - 41 U/L   ALT 17 17 - 63 U/L   Alkaline Phosphatase 84 38 - 126 U/L   Total Bilirubin 1.1 0.3 - 1.2 mg/dL   GFR calc non Af Amer >60 >60 mL/min   GFR calc Af Amer >60 >60 mL/min    Comment: (NOTE) The eGFR has been calculated using the CKD EPI equation. This calculation has not been validated in all clinical situations. eGFR's persistently <60 mL/min signify possible Chronic Kidney Disease.    Anion gap 11 5 - 15    Comment: Performed at [facility], [address] [place], [place] [address]  Ethanol     Status: None   Collection Time: 01/02/18  4:30 PM  Result Value Ref Range   Alcohol, Ethyl (B) <10 <10 mg/dL    Comment:        LOWEST DETECTABLE LIMIT FOR SERUM ALCOHOL IS 10 mg/dL FOR MEDICAL PURPOSES ONLY Performed at [facility], [address] [place], [place] [address]   CBC with Diff     Status: Abnormal   Collection Time: 01/02/18  4:30 PM  Result Value Ref Range   WBC 5.4 4.0 - 10.5 K/uL   RBC 4.74 4.22 - 5.81 MIL/uL   Hemoglobin 16.0 13.0 - 17.0 g/dL   HCT 43.8 39.0 - 52.0 %   MCV 92.4 78.0 - 100.0 fL   MCH 33.8 26.0 - 34.0 pg   MCHC 36.5 (H) 30.0 - 36.0 g/dL   RDW 12.9 11.5 - 15.5 %   Platelets 154 150 - 400 K/uL   Neutrophils Relative % 74 %   Neutro Abs 4.0 1.7 - 7.7 K/uL   Lymphocytes Relative 12 %   Lymphs Abs 0.6 (L) 0.7 - 4.0 K/uL   Monocytes Relative 10 %   Monocytes Absolute 0.6 0.1 - 1.0 K/uL   Eosinophils Relative 3 %   Eosinophils Absolute 0.2 0.0 - 0.7 K/uL   Basophils Relative 1 %   Basophils Absolute 0.0 0.0 - 0.1 K/uL    Comment: Performed at [facility], [address], [place], [place] [address]  CBG monitoring, ED     Status: Abnormal   Collection Time: 01/02/18  9:25  PM  Result Value Ref Range   Glucose-Capillary 243 (H) 65 - 99 mg/dL  I-Stat Chem 8, ED     Status: Abnormal   Collection Time: 01/02/18  9:54 PM  Result Value Ref Range   Sodium 128 (L) 135 - 145 mmol/L   Potassium 4.3 3.5 - 5.1 mmol/L   Chloride 97 (L) 101 - 111 mmol/L   BUN 6 6 - 20 mg/dL   Creatinine, Ser 0.90 0.61 - 1.24 mg/dL   Glucose, Bld 236 (H) 65 - 99 mg/dL   Calcium, Ion 1.17 1.15 - 1.40 mmol/L   TCO2 21 (L) 22 - 32 mmol/L   Hemoglobin 14.3 13.0 - 17.0 g/dL   HCT 42.0 39.0 - 52.0 %  CBG monitoring, ED     Status: Abnormal   Collection Time: 01/03/18  8:47 AM  Result Value Ref Range   Glucose-Capillary 165 (H) 65 - 99 mg/dL    Medications:  Current Facility-Administered Medications  Medication Dose  Route Frequency Provider Last Rate Last Dose  . atorvastatin (LIPITOR) tablet 40 mg  40 mg Oral q1800 Mesner, Corene Cornea, MD   40 mg at 01/02/18 2135  . clonazePAM (KLONOPIN) tablet 0.5 mg  0.5 mg Oral TID PRN Mesner, Corene Cornea, MD   0.5 mg at 01/03/18 1002  . escitalopram (LEXAPRO) tablet 20 mg  20 mg Oral Daily Francine Graven, DO   20 mg at 01/03/18 1002  . gabapentin (NEURONTIN) capsule 800 mg  800 mg Oral QID Mesner, Corene Cornea, MD   800 mg at 01/03/18 1003  . ibuprofen (ADVIL,MOTRIN) tablet 800 mg  800 mg Oral Q6H PRN Mesner, Corene Cornea, MD   800 mg at 01/02/18 2134  . insulin NPH Human (HUMULIN N,NOVOLIN N) injection 10 Units  10 Units Subcutaneous BID AC & HS Mesner, Corene Cornea, MD   10 Units at 01/03/18 0900  . isosorbide dinitrate (ISORDIL) tablet 30 mg  30 mg Oral Daily Francine Graven, DO   30 mg at 01/03/18 1003  . lisinopril (PRINIVIL,ZESTRIL) tablet 10 mg  10 mg Oral Daily Mesner, Jason, MD   10 mg at 01/03/18 1003  . metFORMIN (GLUCOPHAGE-XR) 24 hr tablet 500 mg  500 mg Oral BID WC Mesner, Corene Cornea, MD   500 mg at 01/03/18 0859  . metoprolol tartrate (LOPRESSOR) tablet 25 mg  25 mg Oral BID Mesner, Corene Cornea, MD   25 mg at 01/03/18 1003  . pantoprazole (PROTONIX) EC tablet 40 mg  40 mg  Oral Daily Mesner, Jason, MD   40 mg at 01/03/18 1003  . risperiDONE (RISPERDAL) tablet 0.5 mg  0.5 mg Oral QHS Mesner, Corene Cornea, MD   0.5 mg at 01/02/18 2135  . zolpidem (AMBIEN) tablet 5 mg  5 mg Oral QHS PRN Mesner, Corene Cornea, MD       Current Outpatient Medications  Medication Sig Dispense Refill  . ADVAIR DISKUS 100-50 MCG/DOSE AEPB Inhale 1 puff into the lungs 2 (two) times daily.  5  . albuterol (PROVENTIL HFA;VENTOLIN HFA) 108 (90 Base) MCG/ACT inhaler Inhale 2 puffs into the lungs every 6 (six) hours as needed for wheezing or shortness of breath. 1 Inhaler 0  . atorvastatin (LIPITOR) 40 MG tablet TAKE 1 TABLET BY MOUTH EVERY DAY: For high Cholesterol 1 tablet 0  . clonazePAM (KLONOPIN) 0.5 MG tablet Take 1 tablet (0.5 mg total) by mouth 3 (three) times daily as needed for anxiety. 30 tablet 0  . escitalopram (LEXAPRO) 20 MG tablet Take 20 mg by mouth daily.    [name] gabapentin (NEURONTIN) 800 MG tablet Take 800 mg by mouth 4 (four) times daily.  5  . ibuprofen (ADVIL,MOTRIN) 800 MG tablet Take 1 tablet (800 mg total) by mouth every 6 (six) hours as needed (moderate to severe pain). 20 tablet 0  . insulin NPH Human (HUMULIN N,NOVOLIN N) 100 UNIT/ML injection Inject 0.1 mLs (10 Units total) into the skin 2 (two) times daily at 8 am and 10 pm. For diabetes management 10 mL 0  . isosorbide dinitrate (ISORDIL) 30 MG tablet Take 1 tablet (30 mg total) by mouth daily. For chest pain    . lisinopril (PRINIVIL,ZESTRIL) 10 MG tablet Take 1 tablet by mouth daily.  0  . metFORMIN (GLUCOPHAGE-XR) 500 MG 24 hr tablet Take 1 tablet (500 mg total) by mouth 2 (two) times daily with a meal. For diabetes management 10 tablet 0  . metoprolol tartrate (LOPRESSOR) 25 MG tablet Take 1 tablet (25 mg total) by mouth 2 (two) times daily. 60 tablet  0  . paliperidone (INVEGA SUSTENNA) 234 MG/1.5ML SUSP injection Inject 234 mg into the muscle every 30 (thirty) days. (Due on 11-14-17): For mood control 0.9 mL 0  . pantoprazole  (PROTONIX) 40 MG tablet Take 1 tablet (40 mg total) by mouth daily. For acid reflux 10 tablet 0  . risperiDONE (RISPERDAL) 0.5 MG tablet Take 0.5 mg by mouth at bedtime.    [name] zolpidem (AMBIEN) 5 MG tablet Take 1 tablet (5 mg total) by mouth at bedtime as needed for sleep. 10 tablet 0    Musculoskeletal: UTA via camera  Psychiatric Specialty Exam: Physical Exam  Nursing note and vitals reviewed.   ROS  Blood pressure (!) 142/86, pulse 81, temperature 98.7 F (37.1 C), temperature source Oral, resp. rate 20, weight 98.9 kg (218 lb), SpO2 100 %.Body mass index is 30.84 kg/m.  General Appearance: on hospital bed  Eye Contact:  Good  Speech:  Clear and Coherent and Normal Rate  Volume:  Normal  Mood:  Euthymic  Affect:  Congruent  Thought Process:  Coherent and Goal Directed  Orientation:  Full (Time, Place, and Person)  Thought Content:  WDL and Logical  Suicidal Thoughts:  No  Homicidal Thoughts:  No  Memory:  Immediate;   Good Recent;   Good Remote;   Fair  Judgement:  Good  Insight:  Good and Present  Psychomotor Activity:  Normal  Concentration:  Concentration: Good and Attention Span: Good  Recall:  Good  Fund of Knowledge:  Good  Language:  Good  Akathisia:  Negative  Handed:  Right  AIMS (if indicated):     Assets:  Communication Skills Desire for Improvement Financial Resources/Insurance Housing Social Support  ADL's:  Intact  Cognition:  WNL  Sleep:       Treatment Plan recommendations as discussed and agreed with Dr. Dwyane Dee:  Treatment Plan Summary: Plan to discharge home Follow up with [facility] for therapy and medication management Follow up with Social Work consult for Care coordination Take all medications as prescribed Avoid the use of alcohol and/or drugs Stay well hydrated Activity as tolerated Follow up with PCP for any new or existing medical concerns  Disposition: No evidence of imminent risk to self or  others at present.   Patient does not meet criteria for psychiatric inpatient admission. Supportive therapy provided about ongoing stressors. Refer to IOP. Discussed crisis plan, support from social network, calling 911, coming to the Emergency Department, and calling Suicide Hotline.  This service was provided via telemedicine using a 2-way, interactive audio and video technology.  Names of all persons participating in this telemedicine service and their role in this encounter. Name: [name] Role: Patient  Name: Manley Fason A. Arinze Rivadeneira  Role: NP           Vicenta Aly, NP 01/03/2018 12:04 PM

## 2018-01-10 ENCOUNTER — Telehealth: Payer: Self-pay | Admitting: Family Medicine

## 2018-01-10 NOTE — Telephone Encounter (Signed)
Be advised I do not prescribe benzos or opioids.

## 2018-01-10 NOTE — Telephone Encounter (Signed)
Copied from [place] [id number]. Topic: Inquiry >> Jan 10, 2018  1:04 PM Scherrie Gerlach wrote: Reason for CRM: just an FYI:  pt called today to confirm his appt.  Pt wants to know if Dr Volanda Napoleon will prescribe something for his anxiety. Pt has had SEVERAL trips to the ED. Advised he would need to speak with the dr about any med request or concerns.    I left a message at preferred number for pt to return my call. I tried cell number also, however voice mail was full. Pt will need to establish with another provider, appointment with Dr. Volanda Napoleon has been cancelled, she does not prescribe any benzodiazepines. Dorothyann [name] has agreed to see patient, however there are certain medications that he will not prescribe either. We will try to get the pt in to see a behavioral specialist.

## 2018-01-10 NOTE — Telephone Encounter (Signed)
Please note

## 2018-01-11 ENCOUNTER — Ambulatory Visit: Payer: Self-pay | Admitting: Family Medicine

## 2018-01-11 NOTE — Telephone Encounter (Signed)
I tried to reach pt again on cell number and no answer, will try again later.

## 2018-01-11 NOTE — Telephone Encounter (Signed)
  I left a message at preferred number for pt to return my call. I tried cell number also, however voice mail was full. Pt will need to establish with another provider, appointment with Dr. Volanda Napoleon has been cancelled, she does not prescribe any benzodiazepines. Dorothyann [name] has agreed to see patient, however there are certain medications that he will not prescribe either. We will try to get the pt in to see a behavioral specialist.

## 2018-01-12 NOTE — Telephone Encounter (Signed)
I spoke with pt and he did schedule with Tommi Rumps on 01/19/2018 to establish care.

## 2018-01-13 ENCOUNTER — Ambulatory Visit: Payer: Self-pay | Admitting: Family Medicine

## 2018-01-14 ENCOUNTER — Other Ambulatory Visit: Payer: Self-pay | Admitting: Family Medicine

## 2018-01-14 DIAGNOSIS — E119 Type 2 diabetes mellitus without complications: Secondary | ICD-10-CM

## 2018-01-14 DIAGNOSIS — Z794 Long term (current) use of insulin: Principal | ICD-10-CM

## 2018-01-14 DIAGNOSIS — J432 Centrilobular emphysema: Secondary | ICD-10-CM

## 2018-01-19 ENCOUNTER — Ambulatory Visit: Payer: Self-pay | Admitting: Adult Health

## 2018-01-21 DIAGNOSIS — F29 Unspecified psychosis not due to a substance or known physiological condition: Secondary | ICD-10-CM | POA: Diagnosis not present

## 2018-01-21 DIAGNOSIS — R45851 Suicidal ideations: Secondary | ICD-10-CM | POA: Diagnosis not present

## 2018-01-24 ENCOUNTER — Ambulatory Visit: Payer: Self-pay | Admitting: Adult Health

## 2018-02-06 DIAGNOSIS — J449 Chronic obstructive pulmonary disease, unspecified: Secondary | ICD-10-CM | POA: Diagnosis not present

## 2018-02-06 DIAGNOSIS — J069 Acute upper respiratory infection, unspecified: Secondary | ICD-10-CM | POA: Diagnosis not present

## 2018-02-06 DIAGNOSIS — I517 Cardiomegaly: Secondary | ICD-10-CM | POA: Diagnosis not present

## 2018-02-06 DIAGNOSIS — R0789 Other chest pain: Secondary | ICD-10-CM | POA: Diagnosis not present

## 2018-02-06 DIAGNOSIS — F17200 Nicotine dependence, unspecified, uncomplicated: Secondary | ICD-10-CM | POA: Diagnosis not present

## 2018-02-06 DIAGNOSIS — R112 Nausea with vomiting, unspecified: Secondary | ICD-10-CM | POA: Diagnosis not present

## 2018-02-06 DIAGNOSIS — Z720 Tobacco use: Secondary | ICD-10-CM | POA: Diagnosis not present

## 2018-02-06 DIAGNOSIS — E119 Type 2 diabetes mellitus without complications: Secondary | ICD-10-CM | POA: Diagnosis not present

## 2018-02-13 DIAGNOSIS — F329 Major depressive disorder, single episode, unspecified: Secondary | ICD-10-CM | POA: Diagnosis not present

## 2018-02-13 DIAGNOSIS — F419 Anxiety disorder, unspecified: Secondary | ICD-10-CM | POA: Diagnosis not present

## 2018-02-13 DIAGNOSIS — I10 Essential (primary) hypertension: Secondary | ICD-10-CM | POA: Diagnosis not present

## 2018-02-13 DIAGNOSIS — E1165 Type 2 diabetes mellitus with hyperglycemia: Secondary | ICD-10-CM | POA: Diagnosis not present

## 2018-02-13 DIAGNOSIS — Z9114 Patient's other noncompliance with medication regimen: Secondary | ICD-10-CM | POA: Diagnosis not present

## 2018-02-13 DIAGNOSIS — J449 Chronic obstructive pulmonary disease, unspecified: Secondary | ICD-10-CM | POA: Diagnosis not present

## 2018-02-13 DIAGNOSIS — R252 Cramp and spasm: Secondary | ICD-10-CM | POA: Diagnosis not present

## 2018-02-13 DIAGNOSIS — M79606 Pain in leg, unspecified: Secondary | ICD-10-CM | POA: Diagnosis not present

## 2018-02-13 DIAGNOSIS — F17200 Nicotine dependence, unspecified, uncomplicated: Secondary | ICD-10-CM | POA: Diagnosis not present

## 2018-02-23 DIAGNOSIS — T50904A Poisoning by unspecified drugs, medicaments and biological substances, undetermined, initial encounter: Secondary | ICD-10-CM | POA: Diagnosis not present

## 2018-02-23 DIAGNOSIS — T1491XA Suicide attempt, initial encounter: Secondary | ICD-10-CM | POA: Diagnosis not present

## 2018-02-23 DIAGNOSIS — F29 Unspecified psychosis not due to a substance or known physiological condition: Secondary | ICD-10-CM | POA: Diagnosis not present

## 2018-03-14 DIAGNOSIS — E119 Type 2 diabetes mellitus without complications: Secondary | ICD-10-CM | POA: Diagnosis not present

## 2018-03-14 DIAGNOSIS — E1142 Type 2 diabetes mellitus with diabetic polyneuropathy: Secondary | ICD-10-CM | POA: Diagnosis not present

## 2018-03-14 DIAGNOSIS — F200 Paranoid schizophrenia: Secondary | ICD-10-CM | POA: Diagnosis not present

## 2018-03-14 DIAGNOSIS — J449 Chronic obstructive pulmonary disease, unspecified: Secondary | ICD-10-CM | POA: Diagnosis not present

## 2018-03-14 DIAGNOSIS — Z111 Encounter for screening for respiratory tuberculosis: Secondary | ICD-10-CM | POA: Diagnosis not present

## 2018-03-14 DIAGNOSIS — I10 Essential (primary) hypertension: Secondary | ICD-10-CM | POA: Diagnosis not present

## 2018-03-14 DIAGNOSIS — J441 Chronic obstructive pulmonary disease with (acute) exacerbation: Secondary | ICD-10-CM | POA: Diagnosis not present

## 2018-03-14 DIAGNOSIS — F17210 Nicotine dependence, cigarettes, uncomplicated: Secondary | ICD-10-CM | POA: Diagnosis not present

## 2018-03-14 DIAGNOSIS — E785 Hyperlipidemia, unspecified: Secondary | ICD-10-CM | POA: Diagnosis not present

## 2018-03-17 ENCOUNTER — Other Ambulatory Visit: Payer: Self-pay

## 2018-03-17 ENCOUNTER — Emergency Department (HOSPITAL_COMMUNITY)
Admission: EM | Admit: 2018-03-17 | Discharge: 2018-03-17 | Discharge status: Against Medical Advice | Payer: Medicare Other | Attending: Emergency Medicine | Admitting: Emergency Medicine

## 2018-03-17 ENCOUNTER — Encounter (HOSPITAL_COMMUNITY): Payer: Self-pay

## 2018-03-17 DIAGNOSIS — Z5329 Procedure and treatment not carried out because of patient's decision for other reasons: Secondary | ICD-10-CM | POA: Insufficient documentation

## 2018-03-17 DIAGNOSIS — E119 Type 2 diabetes mellitus without complications: Secondary | ICD-10-CM | POA: Insufficient documentation

## 2018-03-17 DIAGNOSIS — F22 Delusional disorders: Secondary | ICD-10-CM | POA: Insufficient documentation

## 2018-03-17 DIAGNOSIS — F17210 Nicotine dependence, cigarettes, uncomplicated: Secondary | ICD-10-CM | POA: Insufficient documentation

## 2018-03-17 DIAGNOSIS — J449 Chronic obstructive pulmonary disease, unspecified: Secondary | ICD-10-CM | POA: Insufficient documentation

## 2018-03-17 DIAGNOSIS — Z760 Encounter for issue of repeat prescription: Secondary | ICD-10-CM | POA: Insufficient documentation

## 2018-03-17 DIAGNOSIS — Z794 Long term (current) use of insulin: Secondary | ICD-10-CM | POA: Diagnosis not present

## 2018-03-17 DIAGNOSIS — F419 Anxiety disorder, unspecified: Secondary | ICD-10-CM | POA: Diagnosis not present

## 2018-03-17 DIAGNOSIS — Z79899 Other long term (current) drug therapy: Secondary | ICD-10-CM | POA: Insufficient documentation

## 2018-03-17 DIAGNOSIS — R5381 Other malaise: Secondary | ICD-10-CM | POA: Diagnosis not present

## 2018-03-17 DIAGNOSIS — I10 Essential (primary) hypertension: Secondary | ICD-10-CM | POA: Insufficient documentation

## 2018-03-17 NOTE — ED Provider Notes (Signed)
[facility] EMERGENCY DEPARTMENT Provider Note   CSN: [id number] Arrival date & time: 03/17/18  1142     History   Chief Complaint Chief Complaint  Patient presents with  . Anxiety    HPI [name] is a 57 y.o. male.  Patient presented by ambulance here looking to get refills of his medications.  He had a very disjointed story of how his doctor is not prescribing him any more of his Klonopin and he considers him a crooked doctor.  He also is complaining of some increased troubles at his house and thinks somebody there may want to harm him.  He does not want to return there.  When I asked him directly does he want to kill himself and he said no.  He initially asked me for some medication to calm him down and then stated he really just wanted to go and be discharged.  The history is provided by the patient.  Anxiety  This is a chronic problem. Episode onset: unknown. Pertinent negatives include no chest pain, no abdominal pain, no headaches and no shortness of breath. Nothing aggravates the symptoms. Nothing relieves the symptoms. He has tried nothing for the symptoms. The treatment provided no relief.    Past Medical History:  Diagnosis Date  . Alcohol dependence ([place]) 1979   stated abusing ETOH at age 25   . Back pain   . Benzodiazepine dependence ([place])   . Cardiac arrest ([place])   . Chronic back pain   . Cocaine abuse ([place])   . COPD (chronic obstructive pulmonary disease) ([place])   . Diabetes mellitus without complication ([place]) 4270  . Hypertension 2008  . MI (myocardial infarction) ([place])   . Non-compliance   . Schizoaffective disorder ([place]) 2006   . Substance abuse [facility])     Patient Active Problem List   Diagnosis Date Noted  . MDD (major depressive disorder) 11/12/2017  . 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 Diabetic neuropathy ([place]) 11/11/2016  . Anxiety and depression 11/11/2016  . Chronic insomnia 01/08/2016  .  Alcohol use disorder 12/10/2015  . Benzodiazepine abuse ([place]) 12/10/2015  . Opioid abuse ([place]) 12/10/2015  . Constipation 02/06/2015  . BPH (benign prostatic hyperplasia) 01/16/2015  . Vitamin D deficiency 01/16/2015  . Allergic rhinitis 11/19/2014  . Chronic low back pain 11/07/2014  . Onychomycosis of toenail 11/07/2014  . Tinea pedis 11/07/2014  . Psoriasis 10/14/2014  . HTN (hypertension) 10/14/2014  . Diabetes mellitus type 2, controlled ([place]) 10/14/2014  . COPD (chronic obstructive pulmonary disease) ([place]) 10/14/2014  . Tobacco abuse 10/14/2014  . HLD (hyperlipidemia) 10/14/2014  . Schizoaffective disorder, depressive type ([place]) 10/14/2014    Past Surgical History:  Procedure Laterality Date  . CYST EXCISION          Home Medications    Prior to Admission medications   Medication Sig Start Date End Date Taking? Authorizing Provider  ADVAIR DISKUS 100-50 MCG/DOSE AEPB Inhale 1 puff into the lungs 2 (two) times daily. 12/12/17   [provider]  albuterol (PROVENTIL HFA;VENTOLIN HFA) 108 (90 Base) MCG/ACT inhaler Inhale 2 puffs into the lungs every 6 (six) hours as needed for wheezing or shortness of breath. 11/15/17   Lindell Spar I, NP  atorvastatin (LIPITOR) 40 MG tablet TAKE 1 TABLET BY MOUTH EVERY DAY: For high Cholesterol 11/15/17   Lindell Spar I, NP  clonazePAM (KLONOPIN) 0.5 MG tablet Take 1 tablet (0.5 mg total) by mouth 3 (three)  times daily as needed for anxiety. 12/28/17   Milton Ferguson, MD  escitalopram (LEXAPRO) 20 MG tablet Take 20 mg by mouth daily.    [provider]  gabapentin (NEURONTIN) 800 MG tablet Take 800 mg by mouth 4 (four) times daily. 12/08/17   [provider]  ibuprofen (ADVIL,MOTRIN) 800 MG tablet Take 1 tablet (800 mg total) by mouth every 6 (six) hours as needed (moderate to severe pain). 11/15/17   Lindell Spar I, NP  insulin NPH Human (HUMULIN N,NOVOLIN N) 100 UNIT/ML injection Inject 0.1 mLs (10 Units total) into  the skin 2 (two) times daily at 8 am and 10 pm. For diabetes management 11/15/17   Lindell Spar I, NP  isosorbide dinitrate (ISORDIL) 30 MG tablet Take 1 tablet (30 mg total) by mouth daily. For chest pain 11/15/17   Lindell Spar I, NP  lisinopril (PRINIVIL,ZESTRIL) 10 MG tablet Take 1 tablet by mouth daily. 12/12/17   [provider]  metFORMIN (GLUCOPHAGE-XR) 500 MG 24 hr tablet Take 1 tablet (500 mg total) by mouth 2 (two) times daily with a meal. For diabetes management 11/15/17   Lindell Spar I, NP  metoprolol tartrate (LOPRESSOR) 25 MG tablet Take 1 tablet (25 mg total) by mouth 2 (two) times daily. 12/28/17   Milton Ferguson, MD  paliperidone (INVEGA SUSTENNA) 234 MG/1.5ML SUSP injection Inject 234 mg into the muscle every 30 (thirty) days. (Due on 11-14-17): For mood control 11/15/17   Lindell Spar I, NP  pantoprazole (PROTONIX) 40 MG tablet Take 1 tablet (40 mg total) by mouth daily. For acid reflux 11/15/17   Lindell Spar I, NP  risperiDONE (RISPERDAL) 0.5 MG tablet Take 0.5 mg by mouth at bedtime.    [provider]  zolpidem (AMBIEN) 5 MG tablet Take 1 tablet (5 mg total) by mouth at bedtime as needed for sleep. 11/15/17   Encarnacion Slates, NP    Family History Family History  Problem Relation Age of Onset  . Heart disease Mother   . Heart disease Father   . Heart disease Maternal Grandmother   . Diabetes Maternal Grandmother     Social History Social History   Tobacco Use  . Smoking status: Current Every Day Smoker    Packs/day: 1.50    Types: Cigarettes  . Smokeless tobacco: Never Used  Substance Use Topics  . Alcohol use: Yes    Alcohol/week: 0.0 oz  . Drug use: Yes    Types: Cocaine     Allergies   Quetiapine; Seroquel [quetiapine fumarate]; Trazodone; Trazodone and nefazodone; and Haldol [haloperidol lactate]   Review of Systems Review of Systems  Constitutional: Negative for fever.  HENT: Negative for sore throat.   Respiratory: Negative for  shortness of breath.   Cardiovascular: Negative for chest pain.  Gastrointestinal: Negative for abdominal pain.  Genitourinary: Negative for dysuria.  Skin: Negative for rash.  Neurological: Negative for headaches.     Physical Exam Updated Vital Signs BP (!) 158/95 (BP Location: Right Arm)   Pulse 93   Temp 98 F (36.7 C) (Oral)   Resp 18   Ht 5\' 11"  (1.803 m)   Wt 97.5 kg (215 lb)   SpO2 98%   BMI 29.99 kg/m   Physical Exam  Constitutional: He appears well-developed and well-nourished.  HENT:  Head: Normocephalic and atraumatic.  Eyes: Conjunctivae are normal.  Neck: Neck supple.  Cardiovascular: Normal rate, regular rhythm and normal heart sounds.  Pulmonary/Chest: Effort normal. No respiratory distress. He has  no wheezes.  Abdominal: Soft. He exhibits no mass. There is no tenderness.  Musculoskeletal: He exhibits no tenderness or deformity.  Neurological: He is alert. GCS eye subscore is 4. GCS verbal subscore is 5. GCS motor subscore is 6.  Skin: Skin is warm and dry.  Psychiatric: He has a normal mood and affect. His speech is normal and behavior is normal. Thought content is paranoid. He expresses no suicidal plans and no homicidal plans.  Nursing note and vitals reviewed.    ED Treatments / Results  Labs (all labs ordered are listed, but only abnormal results are displayed) Labs Reviewed - No data to display  EKG None  Radiology No results found.  Procedures Procedures (including critical care time)  Medications Ordered in ED Medications - No data to display   Initial Impression / Assessment and Plan / ED Course  I have reviewed the triage vital signs and the nursing notes.  Pertinent labs & imaging results that were available during my care of the patient were reviewed by me and considered in my medical decision making (see chart for details).  Clinical Course as of Mar 19 1699  Fri Mar 17, 2018  1242 Patient here looking for refill of his  medications and it sounds like his primary care doctor has not wanted to refill his benzos.  On brief talking with him he is paranoid but he denies any suicidal or homicidal ideations.  He is requesting to be discharged and I do not find that I have a legal reason to hold him against as well at present.  I am also currently not comfortable prescribing him benzodiazepines.    [MB]  5300 I offered the patient a mental health evaluation and he was unwilling to stay for this.   [MB]    Clinical Course User Index [MB] Hayden Rasmussen, MD    Final Clinical Impressions(s) / ED Diagnoses   Final diagnoses:  Paranoia [facility])    ED Discharge Orders    None       Hayden Rasmussen, MD 03/19/18 1700

## 2018-03-17 NOTE — ED Notes (Signed)
Pt walked down hallway stating he was going to leave. Able to redirect pt to his room Security notified. EDP at bedside talking to pt

## 2018-03-17 NOTE — ED Notes (Signed)
Pt reports he was dismissed from his previous PCP. He is now seeing Dr. Legrand Rams and saw him a few days ago. Pt reports he refilled his cholesterol medication and Neurontin, but not his Klonopin and Metoprolol. Pt unsure why. Pt reports he needs his Klonopin and Metoprolol medications refilled.

## 2018-03-17 NOTE — ED Notes (Signed)
Pt is from [place] home

## 2018-03-17 NOTE — Discharge Instructions (Signed)
Patient left prior to instructions being given. He was not willing to speak with mental health.

## 2018-03-17 NOTE — ED Triage Notes (Signed)
Pt brought in by [facility]. Wants a refill on his medications. Pt states he doesn't have nay way to get them.

## 2018-03-17 NOTE — ED Notes (Signed)
EDP states we are unable to hold pt. Pt left AMA

## 2018-03-28 DIAGNOSIS — R0689 Other abnormalities of breathing: Secondary | ICD-10-CM | POA: Diagnosis not present

## 2018-03-28 DIAGNOSIS — Z046 Encounter for general psychiatric examination, requested by authority: Secondary | ICD-10-CM | POA: Diagnosis not present

## 2018-03-28 DIAGNOSIS — E1165 Type 2 diabetes mellitus with hyperglycemia: Secondary | ICD-10-CM | POA: Diagnosis not present

## 2018-03-28 DIAGNOSIS — F29 Unspecified psychosis not due to a substance or known physiological condition: Secondary | ICD-10-CM | POA: Diagnosis not present

## 2018-03-28 DIAGNOSIS — F6810 Factitious disorder, unspecified: Secondary | ICD-10-CM | POA: Diagnosis not present

## 2018-03-28 DIAGNOSIS — F17210 Nicotine dependence, cigarettes, uncomplicated: Secondary | ICD-10-CM | POA: Diagnosis not present

## 2018-03-28 DIAGNOSIS — R739 Hyperglycemia, unspecified: Secondary | ICD-10-CM | POA: Diagnosis not present

## 2018-03-28 DIAGNOSIS — J449 Chronic obstructive pulmonary disease, unspecified: Secondary | ICD-10-CM | POA: Diagnosis not present

## 2018-03-28 DIAGNOSIS — J9811 Atelectasis: Secondary | ICD-10-CM | POA: Diagnosis not present

## 2018-03-28 DIAGNOSIS — R0902 Hypoxemia: Secondary | ICD-10-CM | POA: Diagnosis not present

## 2018-03-29 DIAGNOSIS — J449 Chronic obstructive pulmonary disease, unspecified: Secondary | ICD-10-CM | POA: Diagnosis not present

## 2018-03-29 DIAGNOSIS — F17200 Nicotine dependence, unspecified, uncomplicated: Secondary | ICD-10-CM | POA: Diagnosis not present

## 2018-03-29 DIAGNOSIS — E1165 Type 2 diabetes mellitus with hyperglycemia: Secondary | ICD-10-CM | POA: Diagnosis not present

## 2018-03-29 DIAGNOSIS — I10 Essential (primary) hypertension: Secondary | ICD-10-CM | POA: Diagnosis not present

## 2018-03-29 DIAGNOSIS — F419 Anxiety disorder, unspecified: Secondary | ICD-10-CM | POA: Diagnosis not present

## 2018-03-29 DIAGNOSIS — Z79899 Other long term (current) drug therapy: Secondary | ICD-10-CM | POA: Diagnosis not present

## 2018-03-29 DIAGNOSIS — Z794 Long term (current) use of insulin: Secondary | ICD-10-CM | POA: Diagnosis not present

## 2018-03-29 DIAGNOSIS — R45851 Suicidal ideations: Secondary | ICD-10-CM | POA: Diagnosis not present

## 2018-03-29 DIAGNOSIS — F329 Major depressive disorder, single episode, unspecified: Secondary | ICD-10-CM | POA: Diagnosis not present

## 2018-03-29 DIAGNOSIS — Z9119 Patient's noncompliance with other medical treatment and regimen: Secondary | ICD-10-CM | POA: Diagnosis not present

## 2018-03-30 DIAGNOSIS — F259 Schizoaffective disorder, unspecified: Secondary | ICD-10-CM | POA: Diagnosis present

## 2018-03-30 DIAGNOSIS — F4325 Adjustment disorder with mixed disturbance of emotions and conduct: Secondary | ICD-10-CM | POA: Diagnosis not present

## 2018-03-30 DIAGNOSIS — E119 Type 2 diabetes mellitus without complications: Secondary | ICD-10-CM | POA: Diagnosis present

## 2018-03-30 DIAGNOSIS — Z7289 Other problems related to lifestyle: Secondary | ICD-10-CM | POA: Diagnosis not present

## 2018-03-30 DIAGNOSIS — J449 Chronic obstructive pulmonary disease, unspecified: Secondary | ICD-10-CM | POA: Diagnosis present

## 2018-03-30 DIAGNOSIS — Z888 Allergy status to other drugs, medicaments and biological substances status: Secondary | ICD-10-CM | POA: Diagnosis not present

## 2018-03-30 DIAGNOSIS — G8929 Other chronic pain: Secondary | ICD-10-CM | POA: Diagnosis present

## 2018-03-30 DIAGNOSIS — F419 Anxiety disorder, unspecified: Secondary | ICD-10-CM | POA: Diagnosis not present

## 2018-03-30 DIAGNOSIS — E1165 Type 2 diabetes mellitus with hyperglycemia: Secondary | ICD-10-CM | POA: Diagnosis not present

## 2018-03-30 DIAGNOSIS — Z9119 Patient's noncompliance with other medical treatment and regimen: Secondary | ICD-10-CM | POA: Diagnosis not present

## 2018-03-30 DIAGNOSIS — Z765 Malingerer [conscious simulation]: Secondary | ICD-10-CM | POA: Diagnosis not present

## 2018-03-30 DIAGNOSIS — I10 Essential (primary) hypertension: Secondary | ICD-10-CM | POA: Diagnosis present

## 2018-03-30 DIAGNOSIS — R45851 Suicidal ideations: Secondary | ICD-10-CM | POA: Diagnosis not present

## 2018-03-30 DIAGNOSIS — Z794 Long term (current) use of insulin: Secondary | ICD-10-CM | POA: Diagnosis not present

## 2018-03-30 DIAGNOSIS — L409 Psoriasis, unspecified: Secondary | ICD-10-CM | POA: Diagnosis present

## 2018-03-30 DIAGNOSIS — Z915 Personal history of self-harm: Secondary | ICD-10-CM | POA: Diagnosis not present

## 2018-04-11 DIAGNOSIS — J449 Chronic obstructive pulmonary disease, unspecified: Secondary | ICD-10-CM | POA: Diagnosis not present

## 2018-04-11 DIAGNOSIS — I10 Essential (primary) hypertension: Secondary | ICD-10-CM | POA: Diagnosis not present

## 2018-04-11 DIAGNOSIS — R000 Tachycardia, unspecified: Secondary | ICD-10-CM | POA: Diagnosis not present

## 2018-04-11 DIAGNOSIS — R079 Chest pain, unspecified: Secondary | ICD-10-CM | POA: Diagnosis not present

## 2018-04-11 DIAGNOSIS — R0689 Other abnormalities of breathing: Secondary | ICD-10-CM | POA: Diagnosis not present

## 2018-04-11 DIAGNOSIS — F29 Unspecified psychosis not due to a substance or known physiological condition: Secondary | ICD-10-CM | POA: Diagnosis not present

## 2018-04-11 DIAGNOSIS — E119 Type 2 diabetes mellitus without complications: Secondary | ICD-10-CM | POA: Diagnosis not present

## 2018-04-11 DIAGNOSIS — F17200 Nicotine dependence, unspecified, uncomplicated: Secondary | ICD-10-CM | POA: Diagnosis not present

## 2018-04-13 ENCOUNTER — Other Ambulatory Visit: Payer: Self-pay

## 2018-04-13 ENCOUNTER — Emergency Department (HOSPITAL_COMMUNITY)
Admission: EM | Admit: 2018-04-13 | Discharge: 2018-04-13 | Disposition: A | Discharge status: Home / Self Care | Payer: Medicare Other | Source: Home / Self Care | Attending: Emergency Medicine | Admitting: Emergency Medicine

## 2018-04-13 ENCOUNTER — Ambulatory Visit: Payer: Medicare Other | Admitting: "Endocrinology

## 2018-04-13 ENCOUNTER — Observation Stay (HOSPITAL_COMMUNITY)
Admission: EM | Admit: 2018-04-13 | Discharge: 2018-04-14 | Disposition: A | Discharge status: Home / Self Care | Payer: Medicare Other | Attending: Family Medicine | Admitting: Family Medicine

## 2018-04-13 ENCOUNTER — Encounter (HOSPITAL_COMMUNITY): Payer: Self-pay

## 2018-04-13 DIAGNOSIS — Z794 Long term (current) use of insulin: Secondary | ICD-10-CM | POA: Insufficient documentation

## 2018-04-13 DIAGNOSIS — G8929 Other chronic pain: Secondary | ICD-10-CM

## 2018-04-13 DIAGNOSIS — M545 Low back pain, unspecified: Secondary | ICD-10-CM

## 2018-04-13 DIAGNOSIS — F17210 Nicotine dependence, cigarettes, uncomplicated: Secondary | ICD-10-CM

## 2018-04-13 DIAGNOSIS — Z79899 Other long term (current) drug therapy: Secondary | ICD-10-CM

## 2018-04-13 DIAGNOSIS — T50901A Poisoning by unspecified drugs, medicaments and biological substances, accidental (unintentional), initial encounter: Secondary | ICD-10-CM | POA: Diagnosis present

## 2018-04-13 DIAGNOSIS — J449 Chronic obstructive pulmonary disease, unspecified: Secondary | ICD-10-CM | POA: Insufficient documentation

## 2018-04-13 DIAGNOSIS — I252 Old myocardial infarction: Secondary | ICD-10-CM | POA: Insufficient documentation

## 2018-04-13 DIAGNOSIS — F259 Schizoaffective disorder, unspecified: Secondary | ICD-10-CM | POA: Diagnosis not present

## 2018-04-13 DIAGNOSIS — I10 Essential (primary) hypertension: Secondary | ICD-10-CM

## 2018-04-13 DIAGNOSIS — M5489 Other dorsalgia: Secondary | ICD-10-CM | POA: Diagnosis not present

## 2018-04-13 DIAGNOSIS — R079 Chest pain, unspecified: Secondary | ICD-10-CM | POA: Diagnosis not present

## 2018-04-13 DIAGNOSIS — T50902A Poisoning by unspecified drugs, medicaments and biological substances, intentional self-harm, initial encounter: Secondary | ICD-10-CM | POA: Diagnosis present

## 2018-04-13 DIAGNOSIS — T447X2A Poisoning by beta-adrenoreceptor antagonists, intentional self-harm, initial encounter: Secondary | ICD-10-CM | POA: Diagnosis not present

## 2018-04-13 DIAGNOSIS — F1410 Cocaine abuse, uncomplicated: Secondary | ICD-10-CM | POA: Diagnosis not present

## 2018-04-13 DIAGNOSIS — E119 Type 2 diabetes mellitus without complications: Secondary | ICD-10-CM

## 2018-04-13 DIAGNOSIS — E1140 Type 2 diabetes mellitus with diabetic neuropathy, unspecified: Secondary | ICD-10-CM | POA: Insufficient documentation

## 2018-04-13 DIAGNOSIS — R0789 Other chest pain: Secondary | ICD-10-CM | POA: Diagnosis not present

## 2018-04-13 DIAGNOSIS — F251 Schizoaffective disorder, depressive type: Secondary | ICD-10-CM | POA: Diagnosis present

## 2018-04-13 LAB — BASIC METABOLIC PANEL
Anion gap: 8 (ref 5–15)
BUN: 14 mg/dL (ref 6–20)
CO2: 21 mmol/L — ABNORMAL LOW (ref 22–32)
Calcium: 9 mg/dL (ref 8.9–10.3)
Chloride: 105 mmol/L (ref 98–111)
Creatinine, Ser: 1.25 mg/dL — ABNORMAL HIGH (ref 0.61–1.24)
GFR calc Af Amer: >60 mL/min (ref >60)
GFR calc non Af Amer: >60 mL/min (ref >60)
Glucose, Bld: 273 mg/dL — ABNORMAL HIGH (ref 70–99)
Potassium: 4.4 mmol/L (ref 3.5–5.1)
Sodium: 134 mmol/L — ABNORMAL LOW (ref 135–145)

## 2018-04-13 LAB — CBC WITH DIFFERENTIAL/PLATELET
Basophils Absolute: 0 10*3/uL (ref 0.0–0.1)
Basophils Relative: 0 %
Eosinophils Absolute: 0.2 10*3/uL (ref 0.0–0.7)
Eosinophils Relative: 1 %
HCT: 43.7 % (ref 39.0–52.0)
Hemoglobin: 15.5 g/dL (ref 13.0–17.0)
Lymphocytes Relative: 8 %
Lymphs Abs: 0.9 10*3/uL (ref 0.7–4.0)
MCH: 34.2 pg — ABNORMAL HIGH (ref 26.0–34.0)
MCHC: 35.5 g/dL (ref 30.0–36.0)
MCV: 96.5 fL (ref 78.0–100.0)
Monocytes Absolute: 0.5 10*3/uL (ref 0.1–1.0)
Monocytes Relative: 5 %
Neutro Abs: 10.1 10*3/uL — ABNORMAL HIGH (ref 1.7–7.7)
Neutrophils Relative %: 86 %
Platelets: 146 10*3/uL — ABNORMAL LOW (ref 150–400)
RBC: 4.53 MIL/uL (ref 4.22–5.81)
RDW: 13.8 % (ref 11.5–15.5)
WBC: 11.8 10*3/uL — ABNORMAL HIGH (ref 4.0–10.5)

## 2018-04-13 LAB — SALICYLATE LEVEL: Salicylate Lvl: <7 mg/dL (ref 2.8–30.0)

## 2018-04-13 LAB — LACTIC ACID, PLASMA: Lactic Acid, Venous: 1.3 mmol/L (ref 0.5–1.9)

## 2018-04-13 LAB — ACETAMINOPHEN LEVEL: Acetaminophen (Tylenol), Serum: <10 ug/mL — ABNORMAL LOW (ref 10–30)

## 2018-04-13 LAB — MAGNESIUM: Magnesium: 1.9 mg/dL (ref 1.7–2.4)

## 2018-04-13 MED ORDER — CHARCOAL ACTIVATED PO LIQD
50.0000 g | Freq: Once | ORAL | Status: AC
  Administered 2018-04-13: 50 g via ORAL
  Filled 2018-04-13: qty 240

## 2018-04-13 MED ORDER — SODIUM CHLORIDE 0.9 % IV BOLUS
1000.0000 mL | Freq: Once | INTRAVENOUS | Status: AC
  Administered 2018-04-13: 1000 mL via INTRAVENOUS

## 2018-04-13 MED ORDER — DEXAMETHASONE 4 MG PO TABS
4.0000 mg | ORAL_TABLET | Freq: Once | ORAL | Status: AC
Start: 2018-04-13 — End: 2018-04-13
  Administered 2018-04-13: 4 mg via ORAL
  Filled 2018-04-13: qty 1

## 2018-04-13 MED ORDER — IBUPROFEN 400 MG PO TABS
600.0000 mg | ORAL_TABLET | Freq: Once | ORAL | Status: AC
  Administered 2018-04-13: 600 mg via ORAL
  Filled 2018-04-13: qty 2

## 2018-04-13 NOTE — ED Provider Notes (Signed)
[facility] EMERGENCY DEPARTMENT Provider Note   CSN: [id number] Arrival date & time: 04/13/18  2137     History   Chief Complaint Chief Complaint  Patient presents with  . Drug Overdose    HPI [name] is a 57 y.o. male.  HPI   56ym with intentional overdose. I actually saw him earlier today for back pain. He wasn't happy with not getting narcotic pain medication and didn't have a ride home so he went back to the waiting room and took 24 25 mg tablets of metoprolol. Prescription was just filled today. 60 tablets. 36 are left. He denies taking anything else. He has no new symptoms aside from his same back/hip pain.   Past Medical History:  Diagnosis Date  . Alcohol dependence ([place]) 1979   stated abusing ETOH at age 19   . Back pain   . Benzodiazepine dependence ([place])   . Cardiac arrest ([place])   . Chronic back pain   . Cocaine abuse ([place])   . COPD (chronic obstructive pulmonary disease) ([place])   . Diabetes mellitus without complication ([place]) 9485  . Hypertension 2008  . MI (myocardial infarction) ([place])   . Non-compliance   . Schizoaffective disorder ([place]) 2006   . Substance abuse [facility])     Patient Active Problem List   Diagnosis Date Noted  . MDD (major depressive disorder) 11/12/2017  . 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 Diabetic neuropathy ([place]) 11/11/2016  . Anxiety and depression 11/11/2016  . Chronic insomnia 01/08/2016  . Alcohol use disorder 12/10/2015  . Benzodiazepine abuse ([place]) 12/10/2015  . Opioid abuse ([place]) 12/10/2015  . Constipation 02/06/2015  . BPH (benign prostatic hyperplasia) 01/16/2015  . Vitamin D deficiency 01/16/2015  . Allergic rhinitis 11/19/2014  . Chronic low back pain 11/07/2014  . Onychomycosis of toenail 11/07/2014  . Tinea pedis 11/07/2014  . Psoriasis 10/14/2014  . HTN (hypertension) 10/14/2014  . Diabetes mellitus type 2, controlled ([place]) 10/14/2014  . COPD (chronic  obstructive pulmonary disease) ([place]) 10/14/2014  . Tobacco abuse 10/14/2014  . HLD (hyperlipidemia) 10/14/2014  . Schizoaffective disorder, depressive type ([place]) 10/14/2014    Past Surgical History:  Procedure Laterality Date  . CYST EXCISION          Home Medications    Prior to Admission medications   Medication Sig Start Date End Date Taking? Authorizing Provider  ADVAIR DISKUS 100-50 MCG/DOSE AEPB Inhale 1 puff into the lungs 2 (two) times daily. 12/12/17   [provider]  albuterol (PROVENTIL HFA;VENTOLIN HFA) 108 (90 Base) MCG/ACT inhaler Inhale 2 puffs into the lungs every 6 (six) hours as needed for wheezing or shortness of breath. 11/15/17   Lindell Spar I, NP  atorvastatin (LIPITOR) 40 MG tablet TAKE 1 TABLET BY MOUTH EVERY DAY: For high Cholesterol 11/15/17   Lindell Spar I, NP  clonazePAM (KLONOPIN) 0.5 MG tablet Take 1 tablet (0.5 mg total) by mouth 3 (three) times daily as needed for anxiety. 12/28/17   Milton Ferguson, MD  escitalopram (LEXAPRO) 20 MG tablet Take 20 mg by mouth daily.    [provider]  gabapentin (NEURONTIN) 800 MG tablet Take 800 mg by mouth 4 (four) times daily. 12/08/17   [provider]  ibuprofen (ADVIL,MOTRIN) 800 MG tablet Take 1 tablet (800 mg total) by mouth every 6 (six) hours as needed (moderate to severe pain). 11/15/17   Lindell Spar I, NP  insulin NPH Human (HUMULIN N,NOVOLIN N) 100  UNIT/ML injection Inject 0.1 mLs (10 Units total) into the skin 2 (two) times daily at 8 am and 10 pm. For diabetes management 11/15/17   Lindell Spar I, NP  isosorbide dinitrate (ISORDIL) 30 MG tablet Take 1 tablet (30 mg total) by mouth daily. For chest pain 11/15/17   Lindell Spar I, NP  lisinopril (PRINIVIL,ZESTRIL) 10 MG tablet Take 1 tablet by mouth daily. 12/12/17   [provider]  metFORMIN (GLUCOPHAGE-XR) 500 MG 24 hr tablet Take 1 tablet (500 mg total) by mouth 2 (two) times daily with a meal. For diabetes management 11/15/17    Lindell Spar I, NP  metoprolol tartrate (LOPRESSOR) 25 MG tablet Take 1 tablet (25 mg total) by mouth 2 (two) times daily. 12/28/17   Milton Ferguson, MD  paliperidone (INVEGA SUSTENNA) 234 MG/1.5ML SUSP injection Inject 234 mg into the muscle every 30 (thirty) days. (Due on 11-14-17): For mood control 11/15/17   Lindell Spar I, NP  pantoprazole (PROTONIX) 40 MG tablet Take 1 tablet (40 mg total) by mouth daily. For acid reflux 11/15/17   Lindell Spar I, NP  risperiDONE (RISPERDAL) 0.5 MG tablet Take 0.5 mg by mouth at bedtime.    [provider]  zolpidem (AMBIEN) 5 MG tablet Take 1 tablet (5 mg total) by mouth at bedtime as needed for sleep. 11/15/17   Encarnacion Slates, NP    Family History Family History  Problem Relation Age of Onset  . Heart disease Mother   . Heart disease Father   . Heart disease Maternal Grandmother   . Diabetes Maternal Grandmother     Social History Social History   Tobacco Use  . Smoking status: Current Every Day Smoker    Packs/day: 1.50    Types: Cigarettes  . Smokeless tobacco: Never Used  Substance Use Topics  . Alcohol use: Yes    Alcohol/week: 0.0 oz  . Drug use: Yes    Types: Cocaine     Allergies   Quetiapine; Seroquel [quetiapine fumarate]; Trazodone; Trazodone and nefazodone; and Haldol [haloperidol lactate]   Review of Systems Review of Systems  All systems reviewed and negative, other than as noted in HPI.  Physical Exam Updated Vital Signs BP (!) 142/84 (BP Location: Right Arm)   Pulse 72   Temp 98.1 F (36.7 C) (Oral)   Resp 17   Ht 6' (1.829 m)   Wt 99.8 kg (220 lb)   SpO2 98%   BMI 29.84 kg/m   Physical Exam  Constitutional: He appears well-developed and well-nourished. No distress.  Sitting up in bed. NAD. Obese.   HENT:  Head: Normocephalic and atraumatic.  Eyes: Conjunctivae are normal. Right eye exhibits no discharge. Left eye exhibits no discharge.  Neck: Neck supple.  Cardiovascular: Normal rate,  regular rhythm and normal heart sounds. Exam reveals no gallop and no friction rub.  No murmur heard. Pulmonary/Chest: Effort normal and breath sounds normal. No respiratory distress.  Abdominal: Soft. He exhibits no distension. There is no tenderness.  Musculoskeletal: He exhibits no edema or tenderness.  Neurological: He is alert.  Skin: Skin is warm and dry.  Psychiatric: He has a normal mood and affect. His behavior is normal. Thought content normal.  Nursing note and vitals reviewed.    ED Treatments / Results  Labs (all labs ordered are listed, but only abnormal results are displayed) Labs Reviewed  CBC WITH DIFFERENTIAL/PLATELET - Abnormal; Notable for the following components:      Result Value   WBC  11.8 (*)    MCH 34.2 (*)    Platelets 146 (*)    Neutro Abs 10.1 (*)    All other components within normal limits  BASIC METABOLIC PANEL - Abnormal; Notable for the following components:   Sodium 134 (*)    CO2 21 (*)    Glucose, Bld 273 (*)    Creatinine, Ser 1.25 (*)    All other components within normal limits  ACETAMINOPHEN LEVEL - Abnormal; Notable for the following components:   Acetaminophen (Tylenol), Serum <10 (*)    All other components within normal limits  MAGNESIUM  LACTIC ACID, PLASMA  SALICYLATE LEVEL  LACTIC ACID, PLASMA    EKG EKG Interpretation  Date/Time:  Thursday April 13 2018 23:12:56 EDT Ventricular Rate:  69 PR Interval:    QRS Duration: 100 QT Interval:  438 QTC Calculation: 470 R Axis:   59 Text Interpretation:  Sinus rhythm Borderline low voltage, extremity leads Confirmed by Virgel Manifold [phone]) on 04/13/2018 11:17:36 PM   Radiology No results found.  Procedures Procedures (including critical care time)  CRITICAL CARE Performed by: Virgel Manifold Total critical care time: 40 minutes Critical care time was exclusive of separately billable procedures and treating other patients. Critical care was necessary to treat or prevent  imminent or life-threatening deterioration. Critical care was time spent personally by me on the following activities: development of treatment plan with patient and/or surrogate as well as nursing, discussions with consultants, evaluation of patient's response to treatment, examination of patient, obtaining history from patient or surrogate, ordering and performing treatments and interventions, ordering and review of laboratory studies, ordering and review of radiographic studies, pulse oximetry and re-evaluation of patient's condition.   Medications Ordered in ED Medications  sodium chloride 0.9 % bolus 1,000 mL (1,000 mLs Intravenous New Bag/Given 04/13/18 2319)  charcoal activated (NO SORBITOL) (ACTIDOSE-AQUA) suspension 50 g (50 g Oral Given 04/13/18 2201)  charcoal activated (NO SORBITOL) (ACTIDOSE-AQUA) suspension 50 g (50 g Oral Given 04/13/18 2234)     Initial Impression / Assessment and Plan / ED Course  I have reviewed the triage vital signs and the nursing notes.  Pertinent labs & imaging results that were available during my care of the patient were reviewed by me and considered in my medical decision making (see chart for details).     56yM with intentional overdose of 600 mg of metoprolol while in the waiting room. I saw him minutes after the ingestion. He has no new complaints from the last time I saw him. He apparently has a long history of intentional overdose. One of the other EDPs working is familiar with him and has seen him previously after ingesting antifreeze.   He was brought right back to a room. Given charcoal. Baseline EKG/labs. Denies other ingestion but will check acetaminophen and salicylate levels in 4 hours. Cardiac monitor.   Final Clinical Impressions(s) / ED Diagnoses   Final diagnoses:  Suicide attempt by beta blocker overdose, initial encounter [facility])    ED Discharge Orders    None       Virgel Manifold, MD 04/13/18 2341

## 2018-04-13 NOTE — ED Triage Notes (Signed)
Patient called EMS this afternoon for lower back and right hip, increased pain today. EMS reports CBG 110, vitals WNL.

## 2018-04-13 NOTE — ED Triage Notes (Signed)
Pt took 26 beta blockers in the waiting room, states SI

## 2018-04-13 NOTE — ED Triage Notes (Signed)
Per poison control, pt should receive Activated Charcoal without sorbitol at 1gm/kg, IV fluids, ekg, monitor for at least 8 hours, get labs including lactate, chemistries, salicylate now and tylenol level at 4 hours post ingestion. Watch for bradycardia, use atropine if needed.

## 2018-04-14 DIAGNOSIS — E1122 Type 2 diabetes mellitus with diabetic chronic kidney disease: Secondary | ICD-10-CM | POA: Diagnosis not present

## 2018-04-14 DIAGNOSIS — Z794 Long term (current) use of insulin: Secondary | ICD-10-CM | POA: Diagnosis not present

## 2018-04-14 DIAGNOSIS — F251 Schizoaffective disorder, depressive type: Secondary | ICD-10-CM | POA: Diagnosis not present

## 2018-04-14 DIAGNOSIS — T50902A Poisoning by unspecified drugs, medicaments and biological substances, intentional self-harm, initial encounter: Secondary | ICD-10-CM

## 2018-04-14 DIAGNOSIS — T447X2A Poisoning by beta-adrenoreceptor antagonists, intentional self-harm, initial encounter: Secondary | ICD-10-CM | POA: Diagnosis not present

## 2018-04-14 DIAGNOSIS — T50901A Poisoning by unspecified drugs, medicaments and biological substances, accidental (unintentional), initial encounter: Secondary | ICD-10-CM | POA: Diagnosis present

## 2018-04-14 LAB — ACETAMINOPHEN LEVEL: Acetaminophen (Tylenol), Serum: <10 ug/mL — ABNORMAL LOW (ref 10–30)

## 2018-04-14 LAB — COMPREHENSIVE METABOLIC PANEL
ALK PHOS: 60 U/L (ref 38–126)
ALT: 20 U/L (ref 0–44)
AST: 15 U/L (ref 15–41)
Albumin: 3.8 g/dL (ref 3.5–5.0)
Anion gap: 9 (ref 5–15)
BUN: 11 mg/dL (ref 6–20)
CHLORIDE: 108 mmol/L (ref 98–111)
CO2: 19 mmol/L — ABNORMAL LOW (ref 22–32)
Calcium: 8.9 mg/dL (ref 8.9–10.3)
Creatinine, Ser: 1.06 mg/dL (ref 0.61–1.24)
Glucose, Bld: 294 mg/dL — ABNORMAL HIGH (ref 70–99)
Potassium: 4.4 mmol/L (ref 3.5–5.1)
Sodium: 136 mmol/L (ref 135–145)
Total Bilirubin: 0.6 mg/dL (ref 0.3–1.2)
Total Protein: 6.8 g/dL (ref 6.5–8.1)

## 2018-04-14 LAB — CBC
HCT: 46.2 % (ref 39.0–52.0)
Hemoglobin: 16.2 g/dL (ref 13.0–17.0)
MCH: 33.8 pg (ref 26.0–34.0)
MCHC: 35.1 g/dL (ref 30.0–36.0)
MCV: 96.3 fL (ref 78.0–100.0)
PLATELETS: 162 10*3/uL (ref 150–400)
RBC: 4.8 MIL/uL (ref 4.22–5.81)
RDW: 13.6 % (ref 11.5–15.5)
WBC: 7.1 10*3/uL (ref 4.0–10.5)

## 2018-04-14 LAB — LACTIC ACID, PLASMA
Lactic Acid, Venous: 4.6 mmol/L (ref 0.5–1.9)
Lactic Acid, Venous: 2 mmol/L (ref 0.5–1.9)

## 2018-04-14 LAB — GLUCOSE, CAPILLARY
Glucose-Capillary: 497 mg/dL — ABNORMAL HIGH (ref 70–99)
Glucose-Capillary: 426 mg/dL — ABNORMAL HIGH (ref 70–99)
Glucose-Capillary: 283 mg/dL — ABNORMAL HIGH (ref 70–99)
Glucose-Capillary: 216 mg/dL — ABNORMAL HIGH (ref 70–99)
Glucose-Capillary: 165 mg/dL — ABNORMAL HIGH (ref 70–99)

## 2018-04-14 LAB — SALICYLATE LEVEL: Salicylate Lvl: <7 mg/dL (ref 2.8–30.0)

## 2018-04-14 LAB — HEMOGLOBIN A1C
Hgb A1c MFr Bld: 10.7 % — ABNORMAL HIGH (ref 4.8–5.6)
Mean Plasma Glucose: 260.39 mg/dL

## 2018-04-14 MED ORDER — LORAZEPAM 0.5 MG PO TABS
0.5000 mg | ORAL_TABLET | Freq: Four times a day (QID) | ORAL | Status: DC | PRN
  Administered 2018-04-14: 0.5 mg via ORAL
  Filled 2018-04-14: qty 1

## 2018-04-14 MED ORDER — IPRATROPIUM-ALBUTEROL 0.5-2.5 (3) MG/3ML IN SOLN
0.5000 mg | Freq: Four times a day (QID) | RESPIRATORY_TRACT | Status: DC
  Administered 2018-04-14: 2.5 mg via RESPIRATORY_TRACT
  Filled 2018-04-14: qty 3

## 2018-04-14 MED ORDER — IPRATROPIUM-ALBUTEROL 0.5-2.5 (3) MG/3ML IN SOLN: 3.0000 mL | RESPIRATORY_TRACT | Status: DC | PRN

## 2018-04-14 MED ORDER — RISPERIDONE 0.5 MG PO TABS: 0.5000 mg | ORAL_TABLET | Freq: Every day | ORAL | Status: DC

## 2018-04-14 MED ORDER — ENOXAPARIN SODIUM 40 MG/0.4ML ~~LOC~~ SOLN
40.0000 mg | SUBCUTANEOUS | Status: DC
  Administered 2018-04-14: 40 mg via SUBCUTANEOUS
  Filled 2018-04-14: qty 0.4

## 2018-04-14 MED ORDER — ONDANSETRON HCL 4 MG/2ML IJ SOLN: 4.0000 mg | Freq: Four times a day (QID) | INTRAMUSCULAR | Status: DC | PRN

## 2018-04-14 MED ORDER — ALBUTEROL SULFATE (2.5 MG/3ML) 0.083% IN NEBU: 2.5000 mg | INHALATION_SOLUTION | Freq: Four times a day (QID) | RESPIRATORY_TRACT | Status: DC

## 2018-04-14 MED ORDER — NICOTINE 21 MG/24HR TD PT24
21.0000 mg | MEDICATED_PATCH | Freq: Every day | TRANSDERMAL | Status: DC
  Administered 2018-04-14: 21 mg via TRANSDERMAL
  Filled 2018-04-14: qty 1

## 2018-04-14 MED ORDER — PANTOPRAZOLE SODIUM 40 MG PO TBEC
40.0000 mg | DELAYED_RELEASE_TABLET | Freq: Every day | ORAL | Status: DC
Start: 2018-04-14 — End: 2018-04-14
  Administered 2018-04-14: 40 mg via ORAL
  Filled 2018-04-14: qty 1

## 2018-04-14 MED ORDER — IBUPROFEN 600 MG PO TABS: 600.0000 mg | ORAL_TABLET | Freq: Three times a day (TID) | ORAL | Status: DC | PRN

## 2018-04-14 MED ORDER — ONDANSETRON HCL 4 MG PO TABS: 4.0000 mg | ORAL_TABLET | Freq: Four times a day (QID) | ORAL | Status: DC | PRN

## 2018-04-14 MED ORDER — ACETAMINOPHEN 325 MG PO TABS
650.0000 mg | ORAL_TABLET | Freq: Four times a day (QID) | ORAL | Status: DC | PRN
  Administered 2018-04-14: 650 mg via ORAL
  Filled 2018-04-14 (×2): qty 2

## 2018-04-14 MED ORDER — INSULIN NPH (HUMAN) (ISOPHANE) 100 UNIT/ML ~~LOC~~ SUSP
10.0000 [IU] | Freq: Two times a day (BID) | SUBCUTANEOUS | Status: DC
  Administered 2018-04-14 (×2): 10 [IU] via SUBCUTANEOUS
  Filled 2018-04-14: qty 10

## 2018-04-14 MED ORDER — INSULIN ASPART 100 UNIT/ML ~~LOC~~ SOLN
0.0000 [IU] | Freq: Three times a day (TID) | SUBCUTANEOUS | Status: DC
  Administered 2018-04-14: 3 [IU] via SUBCUTANEOUS
  Administered 2018-04-14: 2 [IU] via SUBCUTANEOUS
  Administered 2018-04-14: 9 [IU] via SUBCUTANEOUS

## 2018-04-14 MED ORDER — SODIUM CHLORIDE 0.9 % IV SOLN
INTRAVENOUS | Status: DC
  Administered 2018-04-14: 02:00:00 via INTRAVENOUS

## 2018-04-14 MED ORDER — HYDRALAZINE HCL 20 MG/ML IJ SOLN
10.0000 mg | INTRAMUSCULAR | Status: DC | PRN
  Administered 2018-04-14: 10 mg via INTRAVENOUS
  Filled 2018-04-14: qty 1

## 2018-04-14 MED ORDER — INSULIN NPH (HUMAN) (ISOPHANE) 100 UNIT/ML ~~LOC~~ SUSP
SUBCUTANEOUS | Status: AC
Start: 2018-04-14 — End: ?
  Filled 2018-04-14: qty 10

## 2018-04-14 MED ORDER — IPRATROPIUM-ALBUTEROL 0.5-2.5 (3) MG/3ML IN SOLN
3.0000 mL | Freq: Four times a day (QID) | RESPIRATORY_TRACT | Status: DC
  Administered 2018-04-14: 3 mL via RESPIRATORY_TRACT
  Filled 2018-04-14: qty 3

## 2018-04-14 MED ORDER — ACETAMINOPHEN 650 MG RE SUPP: 650.0000 mg | Freq: Four times a day (QID) | RECTAL | Status: DC | PRN

## 2018-04-14 NOTE — BH Assessment (Signed)
[place] Assessment Progress Note  TTS consult entered for pt to be assessed. Called pt's RN, Melvenia Needles, to complete assessment. Pt has been very agitated all morning and [place] indicated that pt is not able to be assessed at this time, but try back in @ an hour. Will attempt to assess in @ hour.  Kenna Gilbert. Lovena Le, [place], [place], LPCA Counselor

## 2018-04-14 NOTE — Progress Notes (Signed)
11:07 AM 04/14/2018  The patient was evaluated previously today, see H&P. I personally evaluated this patient at the bedside. The patient is currently at his baseline. [name] is currently homeless asking to be discharged from the hospital. He denies SI and HI. Poison control recommend evaluation of patient for 8 hours. After 8 hour patient vital signs are stable. Patient's heart rate is normal at HR 75. BP is hypertensive at 157/105, SpO2 96% on room air. Patient is A&O x 4. Awaiting evaluation by TTS. If released by [facility] will discharge today.  Vitals:   04/14/18 0801 04/14/18 1022  BP:  (!) 162/120  Pulse:  75  Resp:  20  Temp:    SpO2: 98% 100%   Results for orders placed or performed during the hospital encounter of 04/13/18  CBC with Differential  Result Value Ref Range   WBC 11.8 (H) 4.0 - 10.5 K/uL   RBC 4.53 4.22 - 5.81 MIL/uL   Hemoglobin 15.5 13.0 - 17.0 g/dL   HCT 43.7 39.0 - 52.0 %   MCV 96.5 78.0 - 100.0 fL   MCH 34.2 (H) 26.0 - 34.0 pg   MCHC 35.5 30.0 - 36.0 g/dL   RDW 13.8 11.5 - 15.5 %   Platelets 146 (L) 150 - 400 K/uL   Neutrophils Relative % 86 %   Neutro Abs 10.1 (H) 1.7 - 7.7 K/uL   Lymphocytes Relative 8 %   Lymphs Abs 0.9 0.7 - 4.0 K/uL   Monocytes Relative 5 %   Monocytes Absolute 0.5 0.1 - 1.0 K/uL   Eosinophils Relative 1 %   Eosinophils Absolute 0.2 0.0 - 0.7 K/uL   Basophils Relative 0 %   Basophils Absolute 0.0 0.0 - 0.1 K/uL  Basic metabolic panel  Result Value Ref Range   Sodium 134 (L) 135 - 145 mmol/L   Potassium 4.4 3.5 - 5.1 mmol/L   Chloride 105 98 - 111 mmol/L   CO2 21 (L) 22 - 32 mmol/L   Glucose, Bld 273 (H) 70 - 99 mg/dL   BUN 14 6 - 20 mg/dL   Creatinine, Ser 1.25 (H) 0.61 - 1.24 mg/dL   Calcium 9.0 8.9 - 10.3 mg/dL   GFR calc non Af Amer >60 >60 mL/min   GFR calc Af Amer >60 >60 mL/min   Anion gap 8 5 - 15  Magnesium  Result Value Ref Range   Magnesium 1.9 1.7 - 2.4 mg/dL  Lactic acid, plasma  Result Value Ref Range   Lactic Acid, Venous 1.3 0.5 - 1.9 mmol/L  Lactic acid, plasma  Result Value Ref Range   Lactic Acid, Venous 4.6 (HH) 0.5 - 1.9 mmol/L  Acetaminophen level  Result Value Ref Range   Acetaminophen (Tylenol), Serum <10 (L) 10 - 30 ug/mL  Salicylate level  Result Value Ref Range   Salicylate Lvl <0.3 2.8 - 30.0 mg/dL  CBC  Result Value Ref Range   WBC 7.1 4.0 - 10.5 K/uL   RBC 4.80 4.22 - 5.81 MIL/uL   Hemoglobin 16.2 13.0 - 17.0 g/dL   HCT 46.2 39.0 - 52.0 %   MCV 96.3 78.0 - 100.0 fL   MCH 33.8 26.0 - 34.0 pg   MCHC 35.1 30.0 - 36.0 g/dL   RDW 13.6 11.5 - 15.5 %   Platelets 162 150 - 400 K/uL  Comprehensive metabolic panel  Result Value Ref Range   Sodium 136 135 - 145 mmol/L   Potassium 4.4 3.5 - 5.1 mmol/L  Chloride 108 98 - 111 mmol/L   CO2 19 (L) 22 - 32 mmol/L   Glucose, Bld 294 (H) 70 - 99 mg/dL   BUN 11 6 - 20 mg/dL   Creatinine, Ser 1.06 0.61 - 1.24 mg/dL   Calcium 8.9 8.9 - 10.3 mg/dL   Total Protein 6.8 6.5 - 8.1 g/dL   Albumin 3.8 3.5 - 5.0 g/dL   AST 15 15 - 41 U/L   ALT 20 0 - 44 U/L   Alkaline Phosphatase 60 38 - 126 U/L   Total Bilirubin 0.6 0.3 - 1.2 mg/dL   GFR calc non Af Amer >60 >60 mL/min   GFR calc Af Amer >60 >60 mL/min   Anion gap 9 5 - 15  Lactic acid, plasma  Result Value Ref Range   Lactic Acid, Venous 2.0 (HH) 0.5 - 1.9 mmol/L  Salicylate level  Result Value Ref Range   Salicylate Lvl <5.3 2.8 - 30.0 mg/dL  Acetaminophen level  Result Value Ref Range   Acetaminophen (Tylenol), Serum <10 (L) 10 - 30 ug/mL  Glucose, capillary  Result Value Ref Range   Glucose-Capillary 497 (H) 70 - 99 mg/dL  Glucose, capillary  Result Value Ref Range   Glucose-Capillary 426 (H) 70 - 99 mg/dL   Comment 1 Notify RN    Comment 2 Document in Chart   Glucose, capillary  Result Value Ref Range   Glucose-Capillary 283 (H) 70 - 99 mg/dL  Glucose, capillary  Result Value Ref Range   Glucose-Capillary 216 (H) 70 - 99 mg/dL   Comment 1 Notify RN    Comment  2 Document in Chart        [name], Student AGACNP  Attending:  Irwin Brakeman, MD  [facility]

## 2018-04-14 NOTE — Progress Notes (Signed)
Spoke with Dr. Darrick Meigs concerning patient's lactic acid and CBG. Verbal order to provide NPH and sliding scale insulin now. Orders followed. Will continue to monitor.

## 2018-04-14 NOTE — Progress Notes (Signed)
Patient has been very agitated since arrival to the floor. Patient is demanding klonopin and narcotic pain medication and becoming verbally aggressive after being told that he cannot have these medications. Explained to patient that he is under observation due to his overdose and that he will not receive medications that cause sedation because he needs to be monitored. This RN and charge RN explained situation to patient. Provided patient with tylenol. Patient also refused breathing treatment out of anger over not receiving narcotics (see RRT note), will continue to monitor and educate. Sitter at bedside.

## 2018-04-14 NOTE — Progress Notes (Signed)
Patient discharged  home with instructions given on medications and follow up visits,patient informed the importance of taking medications as prescribed,verbalized understanding.Accompanied by staff to an awaiting vehicle.

## 2018-04-14 NOTE — Progress Notes (Addendum)
Patient is seen by me via tele-psych and I have consulted with Dr. Parke Poisson.  Patient does not meet criteria for inpatient treatment and is cleared by psychiatry.  Patient denies any SI/HI/AVH and contracts for safety.  Patient denies taking the number of pills that was documented in the notes.  He states that he is not even sure how many pills was left in the bottle.  He reports that he only took a couple of extra pills.  Patient states that he is hoping to be discharged soon so that he can get a ride with [facility]TA back to his sister's house.  Patient states he does not want to die and he does not want to hurt anyone else.  He denies any substance abuse.  I have contacted Dr. Wynetta Emery at [facility] and notified him of our recommendation.   Agree with NP assessment

## 2018-04-14 NOTE — Discharge Instructions (Signed)
Drug Overdose A drug overdose happens when you take too much of a drug. An overdose can occur with illegal drugs, prescription drugs, or over-the-counter (OTC) drugs. The effects of a drug overdose can be mild, dangerous, or even deadly. What are the causes? This condition may be caused by:  Taking too much of a drug by accident.  Taking too much of a drug on purpose.  An error made by a health care provider who prescribes a drug.  An error made by the pharmacist who fills the prescription order.  Drugs that commonly cause overdose include:  Mental health drugs.  Pain medicines.  Illegal drugs.  OTC cough and cold medicines.  Heart medicines.  Seizure medicines.  What increases the risk? A drug overdose is more likely in:  Children. They may be attracted to colorful pills. Because of a child's small size, even a small amount of a drug can be dangerous.  Elderly people. They may be taking many different drugs. Elderly people may have difficulty reading labels or remembering when they last took their medicine.  The risk of a drug overdose is also higher for someone who:  Takes illegal drugs.  Takes a drug and drinks alcohol.  Has a mental health condition.  What are the signs or symptoms? Symptoms of a drug overdose depend on the drug and the amount that was taken. Common danger signs include:  Behavior changes.  Sleepiness.  Slowed breathing.  Nausea and vomiting.  Seizures.  Changes in eye pupil size. The pupil may be very large or very small.  If there are signs and symptoms of very low blood pressure (shock) from an overdose, emergency treatment is required. These include:  Cold and clammy skin.  Pale skin.  Blue lips.  Very slow breathing.  Extreme sleepiness.  Loss of consciousness.  How is this diagnosed? This condition may be diagnosed based on your symptoms. It is important to tell your health care provider:  All of the drugs that you  took.  When you took the drugs.  Whether you were drinking alcohol.  Your health care provider will do a physical exam. This exam may include:  Checking and monitoring your heart rate and rhythm, your temperature, and your blood pressure (vital signs).  Checking your breathing and oxygen level.  You may also have tests, including:  Urine tests to check for drugs in your system.  Blood tests to check for: ? Drugs in your system. ? Signs of an imbalance of your blood minerals (electrolytes). ? Liver damage. ? Kidney damage.  How is this treated? Supporting your vital signs and your breathing is the first step in treating a drug overdose. Treatment may also include:  Giving fluids and electrolytes through an IV tube.  Inserting a breathing tube (endotracheal tube) in your airway to help you breathe.  Passing a tube through your nose and into your stomach (NG tube, or nasogastric tube) to wash out your stomach.  Giving medicines that: ? Make you vomit. ? Absorb any medicine that is left in your digestive system. ? Block or reverse the effect of the drug that caused the overdose.  Filtering your blood through an artificial kidney machine (hemodialysis). You may need this if your overdose is severe or if you have kidney failure.  Ongoing counseling and mental health support if you intentionally overdosed or used an illegal drug.  Follow these instructions at home:  Take medicines only as directed by your health care provider. Always ask  your health care provider about possible side effects of any new drug that you start taking.  Keep a list of all of the drugs that you take, including over-the-counter medicines. Bring this list with you to all of your medical visits.  Drink enough fluid to keep your urine clear or pale yellow.  Keep all follow-up visits as directed by your health care provider. This is important. How is this prevented?  Get help if you are struggling  with: ? Alcohol or drug use. ? Depression or another mental health problem.  Keep the phone number of your local poison control center near your phone or on your cell phone.  Store all medicines in safety containers that are out of the reach of children.  Read the drug inserts that come with your medicines.  Do not use illegal drugs.  Do not drink alcohol when taking drugs.  Do not take medicines that are not prescribed for you. Contact a health care provider if:  Your symptoms return.  You develop new symptoms or side effects when you take medicines. Get help right away if:  You think that you or someone else may have taken too much of a drug. The hotline of the [facility] is [phone].  You or someone else is having symptoms of a drug overdose.  You have serious thoughts about hurting yourself or others.  You become confused.  You have: ? Chest pain. ? Difficulty breathing. ? A loss of consciousness. Drug overdose is an emergency. Do not wait to see if the symptoms will g  What You Need to Know About Poisoning, Adult Poisoning occurs when you eat, drink, touch, or breathe in a harmful substance. Poisoning causes health problems that can range from mild to life-threatening. The health effects of poisoning depend on:  The type of poison.  How long you were exposed to the poison.  How much of the poison you were exposed to.  Poisoning can be accidental or intentional. Most poisonings happen in the home and involve common household products. What are some common household poisons? Many household products can cause poisoning if they are used incorrectly. These products include:  Medicines.  Vitamins and minerals.  Herbal supplements.  Cleaning and laundry products.  Paint.  Weed and insect killers.  Beauty products, such as perfume, hair spray, and fingernail polish.  Alcohol.  Recreational drugs.  Plants, such as philodendron,  poinsettia, oleander, castor bean, cactus, and tomato plants.  Batteries.  Automotive products, such as antifreeze.  Gasoline, lighter fluid, and lamp oil.  What should I do after being exposed to a poison? If you are exposed to poison, contact your local poison control center right away. Call [phone] (in the [place] to reach the poison control center for your area. A poison control specialist will often give you directions to follow over the phone. Directions may include the following:  If the poison is still in your mouth, remove it.  If you ate or drank the poison, drink a small amount of water or milk.  If the poison was a medicine, keep the medicine container.  If the poison was from fumes, get away from the fumes.  If the poison was inhaled, get fresh air as soon as possible.  If the poison got on your clothes or skin, remove any clothing with the poison on it and rinse the skin with water.  If the poison got in your eyes, rinse your eyes with water.  How  can poisoning be prevented? Take these steps to help prevent poisoning:  Keep medicines and chemical products in their original containers. Many of these products come in child-safe packaging.  Keep all dangerous household products in locked cabinets and out of reach of children.  Educate othersabout the dangers of possible poisons.  Read labels before using medicines or household products. Leave the original labels on the containers.  When taking a medicine, always keep a light on so that you can clearly see the medicine, and check the dosage every time.  Close containers tightly after using medicine or chemical products.  Get rid of unneeded and outdated medicines. To get rid of a medicine: ? Do not put medicine in the trash or flush it down the toilet. ? Follow the disposal instructions that are on the medicine label or that came with the medicine. ? Use your communitys drug take-back program to get rid of  the medicine. If this option is not available, take the medicine out of the original container and mix it with something that you would not eat, like coffee grounds or kitty litter. Seal the mixture in a bag, can, or other container and throw it away.  Do not mix different household chemicals with each other.  Use protective equipment, such as goggles, masks, or gloves, as needed when using chemicals or cleaners.  Install a carbon monoxide detector in your home.  Keep the phone number for your local poison control center posted near your phone. Make sure everyone in your household knows where to find the number.  When should I get help? Call your local emergency services (911 in [place] if you have been exposed to poison and you:  Have trouble breathing.  Have chest pain.  Have trouble staying awake.  Have a seizure.  Pass out.  Feel dizzy.  Have severe vomiting or bleeding.  Have a headache that gets worse.  Are less alert than normal.  Have a widespread rash.  Notice a change in your vision.  Have trouble swallowing.  Have severe abdominal pain.  Where to find more information: For more information about poisoning prevention, visit the American Association of Poison Control Centers: www.aapcc.org This information is not intended to replace advice given to you by your health care provider. Make sure you discuss any questions you have with your health care provider. Document Released: 09/20/2012 Document Revised: 05/28/2016 Document Reviewed: 07/23/2015 Elsevier Interactive Patient Education  [employer]. o away. Get medical help right away. Call your local emergency services (911 in the [place] Do not drive yourself to the hospital. This information is not intended to replace advice given to you by your health care provider. Make sure you discuss any questions you have with your health care provider. Document Released: 02/18/2015 Document Revised: 02/13/2016  Document Reviewed: 10/09/2014 Elsevier Interactive Patient Education  [employer].

## 2018-04-14 NOTE — Progress Notes (Signed)
Patient remains agitated and is becoming more aggressive. Patient demanding to leave. MD made aware. MD also aware of current CBG.

## 2018-04-14 NOTE — Progress Notes (Signed)
Patient's CBG not crossing over, CBG 283 this A.M.

## 2018-04-14 NOTE — BH Assessment (Signed)
Tele Assessment Note   Patient Name: [name] MRN: [id number] Referring Physician: Gerlene Fee Location of Patient: [facility] Location of Provider: She[place]ahl is a 57 y.o. male, who came to ED via EMS due to lower back and right hip pain. Pt is well known to the [facility] system and is notorious for med seeking. Pt was not given any narcotics for his pain and was subsequently d/c. Pt didn't have a ride home and was in the waiting area when he allegedly took 26 of his rx metoprolol. He reportedly just had his rx filled for 30 tablets and there were only 24 left in the bottle. Pt was held and monitored for the allotted time per poison control and no adverse effects noted. Pt became very angry this morning, demanding narcotics for his "nerves" and TTS assessment had to be postponed due to his behavior.   Pt was extremely pleasant for TTS assessment. Pt denied SI, HI, AVH. Pt denied that he actually took 26 pills, but indicated that he took a few more than prescribed so that he could sleep. Pt indicates that he would like to "go back home and behave myself".   Marvia Pickles, NP, consulted. He also assessed pt and consulted with Dr. Parke Poisson. Pt is recommended for d/c.    Diagnosis: Schizoaffective, by hx  Past Medical History:  Past Medical History:  Diagnosis Date  . Alcohol dependence ([place]) 1979   stated abusing ETOH at age 48   . Back pain   . Benzodiazepine dependence ([place])   . Cardiac arrest ([place])   . Chronic back pain   . Cocaine abuse ([place])   . COPD (chronic obstructive pulmonary disease) ([place])   . Diabetes mellitus without complication ([place]) 0086  . Hypertension 2008  . MI (myocardial infarction) ([place])   . Non-compliance   . Schizoaffective disorder ([place]) 2006   . Substance abuse [facility])     Past Surgical History:  Procedure Laterality Date  . CYST EXCISION      Family History:  Family History  Problem Relation Age of Onset  . Heart  disease Mother   . Heart disease Father   . Heart disease Maternal Grandmother   . Diabetes Maternal Grandmother     Social History:  reports that he has been smoking cigarettes.  He has been smoking about 1.50 packs per day. He has never used smokeless tobacco. He reports that he drinks alcohol. He reports that he has current or past drug history. Drug: Cocaine.  Additional Social History:  Alcohol / Drug Use Pain Medications: see PTA meds Prescriptions: see PTA meds Over the Counter: see PTA meds History of alcohol / drug use?: Yes(Pt reports he quit drinking a month ago and hasn't used illegal drugs in several years. )  CIWA: CIWA-Ar BP: (!) 162/120 Pulse Rate: 75 COWS:    Allergies:  Allergies  Allergen Reactions  . Quetiapine Anaphylaxis  . Seroquel [Quetiapine Fumarate] Anxiety and Other (See Comments)    Reaction:  Nightmares   . Trazodone Anaphylaxis  . Trazodone And Nefazodone Anxiety and Other (See Comments)    Other reaction(s): Unknown Nightmares Reaction:  Nightmares   . Haldol [Haloperidol Lactate] Anxiety and Other (See Comments)    Reaction:  Nightmares     Home Medications:  Medications Prior to Admission  Medication Sig Dispense Refill  . ADVAIR DISKUS 100-50 MCG/DOSE AEPB Inhale 1 puff into the lungs 2 (two) times daily.  5  . albuterol (PROVENTIL HFA;VENTOLIN HFA) 108 (90 Base) MCG/ACT inhaler Inhale 2 puffs into the lungs every 6 (six) hours as needed for wheezing or shortness of breath. 1 Inhaler 0  . atorvastatin (LIPITOR) 40 MG tablet TAKE 1 TABLET BY MOUTH EVERY DAY: For high Cholesterol 1 tablet 0  . clonazePAM (KLONOPIN) 0.5 MG tablet Take 1 tablet (0.5 mg total) by mouth 3 (three) times daily as needed for anxiety. 30 tablet 0  . escitalopram (LEXAPRO) 20 MG tablet Take 20 mg by mouth daily.    [name] gabapentin (NEURONTIN) 800 MG tablet Take 800 mg by mouth 4 (four) times daily.  5  . ibuprofen (ADVIL,MOTRIN) 800 MG tablet Take 1 tablet (800 mg  total) by mouth every 6 (six) hours as needed (moderate to severe pain). 20 tablet 0  . insulin NPH Human (HUMULIN N,NOVOLIN N) 100 UNIT/ML injection Inject 0.1 mLs (10 Units total) into the skin 2 (two) times daily at 8 am and 10 pm. For diabetes management 10 mL 0  . isosorbide dinitrate (ISORDIL) 30 MG tablet Take 1 tablet (30 mg total) by mouth daily. For chest pain    . lisinopril (PRINIVIL,ZESTRIL) 10 MG tablet Take 1 tablet by mouth daily.  0  . metFORMIN (GLUCOPHAGE-XR) 500 MG 24 hr tablet Take 1 tablet (500 mg total) by mouth 2 (two) times daily with a meal. For diabetes management 10 tablet 0  . metoprolol tartrate (LOPRESSOR) 25 MG tablet Take 1 tablet (25 mg total) by mouth 2 (two) times daily. 60 tablet 0  . paliperidone (INVEGA SUSTENNA) 234 MG/1.5ML SUSP injection Inject 234 mg into the muscle every 30 (thirty) days. (Due on 11-14-17): For mood control 0.9 mL 0  . pantoprazole (PROTONIX) 40 MG tablet Take 1 tablet (40 mg total) by mouth daily. For acid reflux 10 tablet 0  . risperiDONE (RISPERDAL) 0.5 MG tablet Take 0.5 mg by mouth at bedtime.    [name] zolpidem (AMBIEN) 5 MG tablet Take 1 tablet (5 mg total) by mouth at bedtime as needed for sleep. 10 tablet 0    OB/GYN Status:  No LMP for male patient.  General Assessment Data Assessment unable to be completed: Yes Reason for not completing assessment: pt not able to participate, per pt's RN. see note in chart Location of Assessment: [facility]) TTS Assessment: In system Is this a Tele or Face-to-Face Assessment?: Tele Assessment Is this an Initial Assessment or a Re-assessment for this encounter?: Initial Assessment Marital status: Divorced Living Arrangements: Other relatives(sister) Can pt return to current living arrangement?: Yes Admission Status: Voluntary Is patient capable of signing voluntary admission?: Yes Referral Source: Self/Family/Friend Insurance type: Medicare     Crisis Care  Plan Living Arrangements: Other relatives(sister) Name of Psychiatrist: [facility] Name of Therapist: [facility]  Education Status Is patient currently in school?: No Is the patient employed, unemployed or receiving disability?: Receiving disability income  Risk to self with the past 6 months Suicidal Ideation: No Has patient been a risk to self within the past 6 months prior to admission? : Yes Suicidal Intent: No Has patient had any suicidal intent within the past 6 months prior to admission? : No Is patient at risk for suicide?: No Suicidal Plan?: No Has patient had any suicidal plan within the past 6 months prior to admission? : No Access to Means: No Previous Attempts/Gestures: Yes How many times?: 1 Triggers for Past Attempts: Unknown Intentional Self Injurious Behavior: None Family Suicide History: Unknown  Persecutory voices/beliefs?: No Depression: No Substance abuse history and/or treatment for substance abuse?: Yes Suicide prevention information given to non-admitted patients: Yes  Risk to Others within the past 6 months Homicidal Ideation: No Does patient have any lifetime risk of violence toward others beyond the six months prior to admission? : No Thoughts of Harm to Others: No Current Homicidal Intent: No Current Homicidal Plan: No Access to Homicidal Means: No History of harm to others?: No Assessment of Violence: None Noted Does patient have access to weapons?: No Criminal Charges Pending?: No Does patient have a court date: Yes Court Date: 04/21/18 Is patient on probation?: No  Psychosis Hallucinations: None noted Delusions: None noted  Mental Status Report Appearance/Hygiene: Unremarkable Eye Contact: Good Motor Activity: Unremarkable Speech: Logical/coherent Level of Consciousness: Alert Mood: Pleasant, Euthymic Affect: Appropriate to circumstance Anxiety Level: Minimal Thought Processes: Coherent, Relevant Judgement: Unimpaired Orientation:  Person, Place, Time, Situation Obsessive Compulsive Thoughts/Behaviors: None  Cognitive Functioning Concentration: Normal Memory: Recent Intact, Remote Intact Is patient IDD: No Is patient DD?: No Insight: Fair Impulse Control: Fair Appetite: Fair Have you had any weight changes? : No Change Sleep: No Change Vegetative Symptoms: None  ADLScreening [facility] Assessment Services) Patient's cognitive ability adequate to safely complete daily activities?: Yes Patient able to express need for assistance with ADLs?: Yes Independently performs ADLs?: Yes (appropriate for developmental age)  Prior Inpatient Therapy Prior Inpatient Therapy: Yes Prior Therapy Dates: multiple admissions Prior Therapy Facilty/Provider(s): multiple Reason for Treatment: SI  Prior Outpatient Therapy Prior Outpatient Therapy: No Does patient have an ACCT team?: Yes Does patient have Intensive In-House Services?  : No Does patient have [facility] services? : No Does patient have P4CC services?: No  ADL Screening (condition at time of admission) Patient's cognitive ability adequate to safely complete daily activities?: Yes Is the patient deaf or have difficulty hearing?: No Does the patient have difficulty seeing, even when wearing glasses/contacts?: No Does the patient have difficulty concentrating, remembering, or making decisions?: No Patient able to express need for assistance with ADLs?: Yes Does the patient have difficulty dressing or bathing?: No Independently performs ADLs?: Yes (appropriate for developmental age) Does the patient have difficulty walking or climbing stairs?: No Weakness of Legs: None Weakness of Arms/Hands: None  Home Assistive Devices/Equipment Home Assistive Devices/Equipment: None  Therapy Consults (therapy consults require a physician order) PT Evaluation Needed: No OT Evalulation Needed: No SLP Evaluation Needed: No Abuse/Neglect Assessment (Assessment to be complete while  patient is alone) Abuse/Neglect Assessment Can Be Completed: Yes Physical Abuse: Denies Verbal Abuse: Denies Sexual Abuse: Denies Exploitation of patient/patient's resources: Denies Self-Neglect: Denies Values / Beliefs Cultural Requests During Hospitalization: None Spiritual Requests During Hospitalization: None Consults Spiritual Care Consult Needed: No Social Work Consult Needed: Yes (Comment) Regulatory affairs officer (For Healthcare) Does Patient Have a Medical Advance Directive?: No Would patient like information on creating a medical advance directive?: No - Patient declined Nutrition Screen- [facility] Adult/[facility]/AP Patient's home diet: Regular Has the patient recently lost weight without trying?: No Has the patient been eating poorly because of a decreased appetite?: No Malnutrition Screening Tool Score: 0        Disposition:  Disposition Initial Assessment Completed for this Encounter: Yes Patient referred to: Other (Comment)(current provider)  This service was provided via telemedicine using a 2-way, interactive audio and video technology.  Names of all persons participating in this telemedicine service and their role in this encounter.   Rexene Edison 04/14/2018 12:24 PM

## 2018-04-14 NOTE — BH Assessment (Signed)
Pt is on the medical floor, needs a Psych consult not a TTS consult.  Spoke with Nira Conn, RN to update her.

## 2018-04-14 NOTE — Discharge Summary (Signed)
Physician Discharge Summary  [name] ZYS:[id number] DOB: [birth year] DOA: 04/13/2018  PCP: Celene Squibb, MD  Admit date: 04/13/2018 Discharge date: 04/14/2018  Admitted From: Home  Disposition: Home  Recommendations for Outpatient Follow-up:  1. Follow up with PCP in 1 weeks 2. Please follow up with psychiatry   Discharge Condition: STABLE   CODE STATUS: FULL     Brief Hospitalization Summary: Please see all hospital notes, images, labs for full details of the hospitalization. HPI:    [name]  is a 57 y.o. male, who was initially seen in the ED for back pain, patient was discharged without narcotic pain medication and did not have a ride home so he went back to waiting room and took 24 to 25 tablets of metoprolol.  Prescription of metoprolol was just filled today.  It had 60 tablets and as per ED only 36 were left.  Patient was seen back in the ED within minutes and received activated charcoal.  Poison control was consulted, and they recommended monitoring for at least 8 hours, checking lactate, chemistries, and salicylate and Tylenol 4 hours postingestion. Patient at this time is asymptomatic.  Denies chest pain or shortness of breath. Does complain of thoughts about hurting himself. Denies abdominal pain, No dysuria urgency or frequency of urination.   Pt was admitted on telemetry and monitored for observation.  He remained stable. Telemetry and vitals remained stable.  He was also seen by psychiatry (tele) service.  He was seen by Marvia Pickles NP and Dr. Parke Poisson.  They determined that he was not a threat to himself or others and that he could be discharged safely to home.  The patient had verbally requested that he be discharged and disconnected from telemetry and IVs.  He says that he will follow up with his PCP and psychiatry team.  No changes made to his medications.    Discharge Diagnoses:  Active Problems:   Diabetes mellitus type 2, controlled ([place])   Schizoaffective  disorder, depressive type [facility])   Overdose    Discharge Instructions: Discharge Instructions    Diet - low sodium heart healthy   Complete by:  As directed    Increase activity slowly   Complete by:  As directed      Allergies as of 04/14/2018      Reactions   Quetiapine Anaphylaxis   Seroquel [quetiapine Fumarate] Anxiety, Other (See Comments)   Reaction:  Nightmares    Trazodone Anaphylaxis   Trazodone And Nefazodone Anxiety, Other (See Comments)   Other reaction(s): Unknown Nightmares Reaction:  Nightmares    Haldol [haloperidol Lactate] Anxiety, Other (See Comments)   Reaction:  Nightmares       Medication List    TAKE these medications   ADVAIR DISKUS 100-50 MCG/DOSE Aepb Generic drug:  Fluticasone-Salmeterol Inhale 1 puff into the lungs 2 (two) times daily.   albuterol 108 (90 Base) MCG/ACT inhaler Commonly known as:  PROVENTIL HFA;VENTOLIN HFA Inhale 2 puffs into the lungs every 6 (six) hours as needed for wheezing or shortness of breath.   atorvastatin 40 MG tablet Commonly known as:  LIPITOR TAKE 1 TABLET BY MOUTH EVERY DAY: For high Cholesterol   clonazePAM 0.5 MG tablet Commonly known as:  KLONOPIN Take 1 tablet (0.5 mg total) by mouth 3 (three) times daily as needed for anxiety.   escitalopram 20 MG tablet Commonly known as:  LEXAPRO Take 20 mg by mouth daily.   gabapentin 800 MG tablet Commonly known as:  NEURONTIN  Take 800 mg by mouth 4 (four) times daily.   ibuprofen 800 MG tablet Commonly known as:  ADVIL,MOTRIN Take 1 tablet (800 mg total) by mouth every 6 (six) hours as needed (moderate to severe pain).   insulin NPH Human 100 UNIT/ML injection Commonly known as:  HUMULIN N,NOVOLIN N Inject 0.1 mLs (10 Units total) into the skin 2 (two) times daily at 8 am and 10 pm. For diabetes management   isosorbide dinitrate 30 MG tablet Commonly known as:  ISORDIL Take 1 tablet (30 mg total) by mouth daily. For chest pain   lisinopril 10 MG  tablet Commonly known as:  PRINIVIL,ZESTRIL Take 1 tablet by mouth daily.   metFORMIN 500 MG 24 hr tablet Commonly known as:  GLUCOPHAGE-XR Take 1 tablet (500 mg total) by mouth 2 (two) times daily with a meal. For diabetes management   metoprolol tartrate 25 MG tablet Commonly known as:  LOPRESSOR Take 1 tablet (25 mg total) by mouth 2 (two) times daily.   paliperidone 234 MG/1.5ML Susp injection Commonly known as:  INVEGA SUSTENNA Inject 234 mg into the muscle every 30 (thirty) days. (Due on 11-14-17): For mood control   pantoprazole 40 MG tablet Commonly known as:  PROTONIX Take 1 tablet (40 mg total) by mouth daily. For acid reflux   risperiDONE 0.5 MG tablet Commonly known as:  RISPERDAL Take 0.5 mg by mouth at bedtime.   zolpidem 5 MG tablet Commonly known as:  AMBIEN Take 1 tablet (5 mg total) by mouth at bedtime as needed for sleep.      Follow-up Information    Celene Squibb, MD Follow up in 1 week(s).   Specialty:  Internal Medicine Contact information: [place] [address] [phone]          Allergies  Allergen Reactions  . Quetiapine Anaphylaxis  . Seroquel [Quetiapine Fumarate] Anxiety and Other (See Comments)    Reaction:  Nightmares   . Trazodone Anaphylaxis  . Trazodone And Nefazodone Anxiety and Other (See Comments)    Other reaction(s): Unknown Nightmares Reaction:  Nightmares   . Haldol [Haloperidol Lactate] Anxiety and Other (See Comments)    Reaction:  Nightmares    Allergies as of 04/14/2018      Reactions   Quetiapine Anaphylaxis   Seroquel [quetiapine Fumarate] Anxiety, Other (See Comments)   Reaction:  Nightmares    Trazodone Anaphylaxis   Trazodone And Nefazodone Anxiety, Other (See Comments)   Other reaction(s): Unknown Nightmares Reaction:  Nightmares    Haldol [haloperidol Lactate] Anxiety, Other (See Comments)   Reaction:  Nightmares       Medication List    TAKE these medications   ADVAIR  DISKUS 100-50 MCG/DOSE Aepb Generic drug:  Fluticasone-Salmeterol Inhale 1 puff into the lungs 2 (two) times daily.   albuterol 108 (90 Base) MCG/ACT inhaler Commonly known as:  PROVENTIL HFA;VENTOLIN HFA Inhale 2 puffs into the lungs every 6 (six) hours as needed for wheezing or shortness of breath.   atorvastatin 40 MG tablet Commonly known as:  LIPITOR TAKE 1 TABLET BY MOUTH EVERY DAY: For high Cholesterol   clonazePAM 0.5 MG tablet Commonly known as:  KLONOPIN Take 1 tablet (0.5 mg total) by mouth 3 (three) times daily as needed for anxiety.   escitalopram 20 MG tablet Commonly known as:  LEXAPRO Take 20 mg by mouth daily.   gabapentin 800 MG tablet Commonly known as:  NEURONTIN Take 800 mg by mouth 4 (four) times  daily.   ibuprofen 800 MG tablet Commonly known as:  ADVIL,MOTRIN Take 1 tablet (800 mg total) by mouth every 6 (six) hours as needed (moderate to severe pain).   insulin NPH Human 100 UNIT/ML injection Commonly known as:  HUMULIN N,NOVOLIN N Inject 0.1 mLs (10 Units total) into the skin 2 (two) times daily at 8 am and 10 pm. For diabetes management   isosorbide dinitrate 30 MG tablet Commonly known as:  ISORDIL Take 1 tablet (30 mg total) by mouth daily. For chest pain   lisinopril 10 MG tablet Commonly known as:  PRINIVIL,ZESTRIL Take 1 tablet by mouth daily.   metFORMIN 500 MG 24 hr tablet Commonly known as:  GLUCOPHAGE-XR Take 1 tablet (500 mg total) by mouth 2 (two) times daily with a meal. For diabetes management   metoprolol tartrate 25 MG tablet Commonly known as:  LOPRESSOR Take 1 tablet (25 mg total) by mouth 2 (two) times daily.   paliperidone 234 MG/1.5ML Susp injection Commonly known as:  INVEGA SUSTENNA Inject 234 mg into the muscle every 30 (thirty) days. (Due on 11-14-17): For mood control   pantoprazole 40 MG tablet Commonly known as:  PROTONIX Take 1 tablet (40 mg total) by mouth daily. For acid reflux   risperiDONE 0.5 MG  tablet Commonly known as:  RISPERDAL Take 0.5 mg by mouth at bedtime.   zolpidem 5 MG tablet Commonly known as:  AMBIEN Take 1 tablet (5 mg total) by mouth at bedtime as needed for sleep.       Procedures/Studies:  No results found.   Subjective: Pt says that he wants to go home.  He says he did not try to harm self or others.  No plans for self harm.    Discharge Exam: Vitals:   04/14/18 0801 04/14/18 1022  BP:  (!) 162/120  Pulse:  75  Resp:  20  Temp:    SpO2: 98% 100%   Vitals:   04/14/18 0332 04/14/18 0621 04/14/18 0801 04/14/18 1022  BP:  (!) 157/105  (!) 162/120  Pulse:  73  75  Resp:  18  20  Temp:  97.8 F (36.6 C)    TempSrc:  Oral    SpO2: 97% 96% 98% 100%  Weight:      Height:       General: Pt is alert, awake, not in acute distress Cardiovascular: RRR, S1/S2 +, no rubs, no gallops Respiratory: CTA bilaterally, no wheezing, no rhonchi Abdominal: Soft, NT, ND, bowel sounds + Extremities: no edema, no cyanosis   The results of significant diagnostics from this hospitalization (including imaging, microbiology, ancillary and laboratory) are listed below for reference.     Microbiology: No results found for this or any previous visit (from the past 240 hour(s)).   Labs: BNP (last 3 results) No results for input(s): BNP in the last 8760 hours. Basic Metabolic Panel: Recent Labs  Lab 04/13/18 2229 04/14/18 0619  NA 134* 136  K 4.4 4.4  CL 105 108  CO2 21* 19*  GLUCOSE 273* 294*  BUN 14 11  CREATININE 1.25* 1.06  CALCIUM 9.0 8.9  MG 1.9  --    Liver Function Tests: Recent Labs  Lab 04/14/18 0619  AST 15  ALT 20  ALKPHOS 60  BILITOT 0.6  PROT 6.8  ALBUMIN 3.8   No results for input(s): LIPASE, AMYLASE in the last 168 hours. No results for input(s): AMMONIA in the last 168 hours. CBC: Recent Labs  Lab 04/13/18  2229 04/14/18 0619  WBC 11.8* 7.1  NEUTROABS 10.1*  --   HGB 15.5 16.2  HCT 43.7 46.2  MCV 96.5 96.3  PLT 146* 162    Cardiac Enzymes: No results for input(s): CKTOTAL, CKMB, CKMBINDEX, TROPONINI in the last 168 hours. BNP: Invalid input(s): POCBNP CBG: Recent Labs  Lab 04/14/18 0232 04/14/18 0356 04/14/18 0620 04/14/18 0758 04/14/18 1120  GLUCAP 497* 426* 283* 216* 165*   D-Dimer No results for input(s): DDIMER in the last 72 hours. Hgb A1c No results for input(s): HGBA1C in the last 72 hours. Lipid Profile No results for input(s): CHOL, HDL, LDLCALC, TRIG, CHOLHDL, LDLDIRECT in the last 72 hours. Thyroid function studies No results for input(s): TSH, T4TOTAL, T3FREE, THYROIDAB in the last 72 hours.  Invalid input(s): FREET3 Anemia work up No results for input(s): VITAMINB12, FOLATE, FERRITIN, TIBC, IRON, RETICCTPCT in the last 72 hours. Urinalysis    Component Value Date/Time   COLORURINE STRAW (A) 01/02/2018 1610   APPEARANCEUR CLEAR 01/02/2018 1610   LABSPEC 1.004 (L) 01/02/2018 1610   PHURINE 5.0 01/02/2018 1610   GLUCOSEU NEGATIVE 01/02/2018 1610   HGBUR SMALL (A) 01/02/2018 1610   BILIRUBINUR NEGATIVE 01/02/2018 1610   BILIRUBINUR neg 01/02/2016 1526   KETONESUR 20 (A) 01/02/2018 1610   PROTEINUR NEGATIVE 01/02/2018 1610   UROBILINOGEN 0.2 01/02/2016 1526   UROBILINOGEN 0.2 07/27/2015 0322   NITRITE NEGATIVE 01/02/2018 1610   LEUKOCYTESUR NEGATIVE 01/02/2018 1610   Sepsis Labs Invalid input(s): PROCALCITONIN,  WBC,  LACTICIDVEN Microbiology No results found for this or any previous visit (from the past 240 hour(s)).  Time coordinating discharge:   SIGNED:  Irwin Brakeman, MD  [facility] 04/14/2018, 12:28 PM Pager [phone]  If 7PM-7AM, please contact night-coverage [email or web address] Password [email or web address]

## 2018-04-14 NOTE — H&P (Signed)
TRH H&P    Patient Demographics:    [name], is a 57 y.o. male  MRN: [id number]  DOB - [birth year]  Admit Date - 04/13/2018  Referring MD/NP/PA: Wilson Singer  Outpatient Primary MD for the patient is Celene Squibb, MD  Patient coming from: Home  Chief complaint-overdose   HPI:    [name]  is a 57 y.o. male, who was initially seen in the ED for back pain, patient was discharged without narcotic pain medication and did not have a ride home so he went back to waiting room and took 24 to 25 tablets of metoprolol.  Prescription of metoprolol was just filled today.  It had 60 tablets and as per ED only 36 were left.  Patient was seen back in the ED within minutes and received activated charcoal.  Poison control was consulted, and they recommended monitoring for at least 8 hours, checking lactate, chemistries, and salicylate and Tylenol 4 hours postingestion. Patient at this time is asymptomatic.  Denies chest pain or shortness of breath. Does complain of thoughts about hurting himself. Denies abdominal pain, No dysuria urgency or frequency of urination.    Review of systems:     All other systems reviewed and are negative.   With Past History of the following :    Past Medical History:  Diagnosis Date  . Alcohol dependence ([place]) 1979   stated abusing ETOH at age 4   . Back pain   . Benzodiazepine dependence ([place])   . Cardiac arrest ([place])   . Chronic back pain   . Cocaine abuse ([place])   . COPD (chronic obstructive pulmonary disease) ([place])   . Diabetes mellitus without complication ([place]) 8453  . Hypertension 2008  . MI (myocardial infarction) ([place])   . Non-compliance   . Schizoaffective disorder ([place]) 2006   . Substance abuse [facility])       Past Surgical History:  Procedure Laterality Date  . CYST EXCISION        Social History:      Social History   Tobacco Use  . Smoking status: Current  Every Day Smoker    Packs/day: 1.50    Types: Cigarettes  . Smokeless tobacco: Never Used  Substance Use Topics  . Alcohol use: Yes    Alcohol/week: 0.0 oz       Family History :     Family History  Problem Relation Age of Onset  . Heart disease Mother   . Heart disease Father   . Heart disease Maternal Grandmother   . Diabetes Maternal Grandmother       Home Medications:   Prior to Admission medications   Medication Sig Start Date End Date Taking? Authorizing Provider  ADVAIR DISKUS 100-50 MCG/DOSE AEPB Inhale 1 puff into the lungs 2 (two) times daily. 12/12/17   [provider]  albuterol (PROVENTIL HFA;VENTOLIN HFA) 108 (90 Base) MCG/ACT inhaler Inhale 2 puffs into the lungs every 6 (six) hours as needed for wheezing or shortness of breath. 11/15/17   Encarnacion Slates, NP  atorvastatin (LIPITOR) 40 MG tablet TAKE 1 TABLET BY MOUTH EVERY DAY: For high Cholesterol 11/15/17   Lindell Spar I, NP  clonazePAM (KLONOPIN) 0.5 MG tablet Take 1 tablet (0.5 mg total) by mouth 3 (three) times daily as needed for anxiety. 12/28/17   Milton Ferguson, MD  escitalopram (LEXAPRO) 20 MG tablet Take 20 mg by mouth daily.    [provider]  gabapentin (NEURONTIN) 800 MG tablet Take 800 mg by mouth 4 (four) times daily. 12/08/17   [provider]  ibuprofen (ADVIL,MOTRIN) 800 MG tablet Take 1 tablet (800 mg total) by mouth every 6 (six) hours as needed (moderate to severe pain). 11/15/17   Lindell Spar I, NP  insulin NPH Human (HUMULIN N,NOVOLIN N) 100 UNIT/ML injection Inject 0.1 mLs (10 Units total) into the skin 2 (two) times daily at 8 am and 10 pm. For diabetes management 11/15/17   Lindell Spar I, NP  isosorbide dinitrate (ISORDIL) 30 MG tablet Take 1 tablet (30 mg total) by mouth daily. For chest pain 11/15/17   Lindell Spar I, NP  lisinopril (PRINIVIL,ZESTRIL) 10 MG tablet Take 1 tablet by mouth daily. 12/12/17   [provider]  metFORMIN (GLUCOPHAGE-XR) 500 MG 24  hr tablet Take 1 tablet (500 mg total) by mouth 2 (two) times daily with a meal. For diabetes management 11/15/17   Lindell Spar I, NP  metoprolol tartrate (LOPRESSOR) 25 MG tablet Take 1 tablet (25 mg total) by mouth 2 (two) times daily. 12/28/17   Milton Ferguson, MD  paliperidone (INVEGA SUSTENNA) 234 MG/1.5ML SUSP injection Inject 234 mg into the muscle every 30 (thirty) days. (Due on 11-14-17): For mood control 11/15/17   Lindell Spar I, NP  pantoprazole (PROTONIX) 40 MG tablet Take 1 tablet (40 mg total) by mouth daily. For acid reflux 11/15/17   Lindell Spar I, NP  risperiDONE (RISPERDAL) 0.5 MG tablet Take 0.5 mg by mouth at bedtime.    [provider]  zolpidem (AMBIEN) 5 MG tablet Take 1 tablet (5 mg total) by mouth at bedtime as needed for sleep. 11/15/17   Encarnacion Slates, NP     Allergies:     Allergies  Allergen Reactions  . Quetiapine Anaphylaxis  . Seroquel [Quetiapine Fumarate] Anxiety and Other (See Comments)    Reaction:  Nightmares   . Trazodone Anaphylaxis  . Trazodone And Nefazodone Anxiety and Other (See Comments)    Other reaction(s): Unknown Nightmares Reaction:  Nightmares   . Haldol [Haloperidol Lactate] Anxiety and Other (See Comments)    Reaction:  Nightmares      Physical Exam:   Vitals  Blood pressure (!) 110/55, pulse 64, temperature 98.1 F (36.7 C), temperature source Oral, resp. rate 16, height 6' (1.829 m), weight 99.8 kg (220 lb), SpO2 97 %.  1.  General: Appears in no acute distress  2. Psychiatric:  Intact judgement and  insight, awake alert, oriented x 3.  3. Neurologic: No focal neurological deficits, all cranial nerves intact.Strength 5/5 all 4 extremities, sensation intact all 4 extremities, plantars down going.  4. Eyes :  anicteric sclerae, moist conjunctivae with no lid lag. PERRLA.  5. ENMT:  Oropharynx clear with moist mucous membranes and good dentition  6. Neck:  supple, no cervical lymphadenopathy appriciated, No  thyromegaly  7. Respiratory : Normal respiratory effort, bilateral wheezing auscultated  8. Cardiovascular : RRR, no gallops, rubs or murmurs, no leg edema  9. Gastrointestinal:  Positive bowel sounds, abdomen soft, non-tender to palpation,no hepatosplenomegaly,  no rigidity or guarding       10. Skin:  No cyanosis, normal texture and turgor, no rash, lesions or ulcers  11.Musculoskeletal:  Good muscle tone,  joints appear normal , no effusions,  normal range of motion    Data Review:    CBC Recent Labs  Lab 04/13/18 2229  WBC 11.8*  HGB 15.5  HCT 43.7  PLT 146*  MCV 96.5  MCH 34.2*  MCHC 35.5  RDW 13.8  LYMPHSABS 0.9  MONOABS 0.5  EOSABS 0.2  BASOSABS 0.0   ------------------------------------------------------------------------------------------------------------------  Chemistries  Recent Labs  Lab 04/13/18 2229  NA 134*  K 4.4  CL 105  CO2 21*  GLUCOSE 273*  BUN 14  CREATININE 1.25*  CALCIUM 9.0  MG 1.9   ------------------------------------------------------------------------------------------------------------------  ------------------------------------------------------------------------------------------------------------------   --------------------------------------------------------------------------------------------------------------- Urine analysis:    Component Value Date/Time   COLORURINE STRAW (A) 01/02/2018 1610   APPEARANCEUR CLEAR 01/02/2018 1610   LABSPEC 1.004 (L) 01/02/2018 1610   PHURINE 5.0 01/02/2018 1610   GLUCOSEU NEGATIVE 01/02/2018 1610   HGBUR SMALL (A) 01/02/2018 1610   BILIRUBINUR NEGATIVE 01/02/2018 1610   BILIRUBINUR neg 01/02/2016 1526   KETONESUR 20 (A) 01/02/2018 1610   PROTEINUR NEGATIVE 01/02/2018 1610   UROBILINOGEN 0.2 01/02/2016 1526   UROBILINOGEN 0.2 07/27/2015 0322   NITRITE NEGATIVE 01/02/2018 1610   LEUKOCYTESUR NEGATIVE 01/02/2018 1610      Imaging Results:    My personal review of EKG:  Rhythm NSR   Assessment & Plan:    Active Problems:   Diabetes mellitus type 2, controlled ([place])   Schizoaffective disorder, depressive type ([place])   Overdose   1. Intentional overdose-patient overdose on metoprolol, will monitor on telemetry, obtain salicylate and Tylenol level in a.m.   2. Suicide attempt-as above, consider psychiatric evaluation once patient is medically stable.  Will continue with one-to-one observation 3. Schizoaffective disorder-continue risperidone 4. Diabetes mellitus-continue NPH 10 units subcu twice daily.  Will start sliding scale insulin with NovoLog.    DVT Prophylaxis-   Lovenox   AM Labs Ordered, also please review Full Orders  Family Communication: Admission, patients condition and plan of care including tests being ordered have been discussed with the patient  who indicate understanding and agree with the plan and Code Status.  Code Status: Full code  Admission status: Inpatient  Time spent in minutes : 60 minutes   Oswald Hillock M.D on 04/14/2018 at 12:42 AM  Between 7am to 7pm - Pager - [phone]. After 7pm go to [email or web address] - password [facility]  [facility] - Office  [phone]

## 2018-04-14 NOTE — Progress Notes (Signed)
Patient very agitated because he cannot get medicine to calm him down. Patient is wheezing very bad and needs breathing treatment but refuses to take treatment. Patient keeps demanding that the doctor give him medicine for his nerves. Patient ripped breathing treatment off and threw it in the floor. RN and CNA at bedside during incident.

## 2018-04-14 NOTE — Progress Notes (Signed)
CRITICAL VALUE ALERT  Critical Value:  Lactic acid, 4.6  Date & Time Notied:  04/14/18, 0200  Provider Notified: [place]  Orders Received/Actions taken:

## 2018-04-15 LAB — HIV ANTIBODY (ROUTINE TESTING W REFLEX): HIV Screen 4th Generation wRfx: NONREACTIVE

## 2018-04-17 NOTE — ED Provider Notes (Signed)
[facility] EMERGENCY DEPARTMENT Provider Note   CSN: [id number] Arrival date & time: 04/13/18  1733     History   Chief Complaint Chief Complaint  Patient presents with  . Back Pain    HPI [name] is a 57 y.o. male.  HPI   57 year old male male with lower back pain.  Chronic.  Worse this afternoon.  Reports doing a lot of walking earlier today.  Pain in his right lower back and right hip.  Worse with movement.  Has not tried taking anything for today.  Past Medical History:  Diagnosis Date  . Alcohol dependence ([place]) 1979   stated abusing ETOH at age 78   . Back pain   . Benzodiazepine dependence ([place])   . Cardiac arrest ([place])   . Chronic back pain   . Cocaine abuse ([place])   . COPD (chronic obstructive pulmonary disease) ([place])   . Diabetes mellitus without complication ([place]) 1308  . Hypertension 2008  . MI (myocardial infarction) ([place])   . Non-compliance   . Schizoaffective disorder ([place]) 2006   . Substance abuse [facility])     Patient Active Problem List   Diagnosis Date Noted  . Overdose 04/14/2018  . MDD (major depressive disorder) 11/12/2017  . 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 Diabetic neuropathy ([place]) 11/11/2016  . Anxiety and depression 11/11/2016  . Chronic insomnia 01/08/2016  . Alcohol use disorder 12/10/2015  . Benzodiazepine abuse ([place]) 12/10/2015  . Opioid abuse ([place]) 12/10/2015  . Constipation 02/06/2015  . BPH (benign prostatic hyperplasia) 01/16/2015  . Vitamin D deficiency 01/16/2015  . Allergic rhinitis 11/19/2014  . Chronic low back pain 11/07/2014  . Onychomycosis of toenail 11/07/2014  . Tinea pedis 11/07/2014  . Psoriasis 10/14/2014  . HTN (hypertension) 10/14/2014  . Diabetes mellitus type 2, controlled ([place]) 10/14/2014  . COPD (chronic obstructive pulmonary disease) ([place]) 10/14/2014  . Tobacco abuse 10/14/2014  . HLD (hyperlipidemia) 10/14/2014  . Schizoaffective disorder,  depressive type ([place]) 10/14/2014    Past Surgical History:  Procedure Laterality Date  . CYST EXCISION          Home Medications    Prior to Admission medications   Medication Sig Start Date End Date Taking? Authorizing Provider  ADVAIR DISKUS 100-50 MCG/DOSE AEPB Inhale 1 puff into the lungs 2 (two) times daily. 12/12/17   [provider]  albuterol (PROVENTIL HFA;VENTOLIN HFA) 108 (90 Base) MCG/ACT inhaler Inhale 2 puffs into the lungs every 6 (six) hours as needed for wheezing or shortness of breath. 11/15/17   Lindell Spar I, NP  atorvastatin (LIPITOR) 40 MG tablet TAKE 1 TABLET BY MOUTH EVERY DAY: For high Cholesterol 11/15/17   Lindell Spar I, NP  clonazePAM (KLONOPIN) 0.5 MG tablet Take 1 tablet (0.5 mg total) by mouth 3 (three) times daily as needed for anxiety. 12/28/17   Milton Ferguson, MD  escitalopram (LEXAPRO) 20 MG tablet Take 20 mg by mouth daily.    [provider]  gabapentin (NEURONTIN) 800 MG tablet Take 800 mg by mouth 4 (four) times daily. 12/08/17   [provider]  ibuprofen (ADVIL,MOTRIN) 800 MG tablet Take 1 tablet (800 mg total) by mouth every 6 (six) hours as needed (moderate to severe pain). 11/15/17   Lindell Spar I, NP  insulin NPH Human (HUMULIN N,NOVOLIN N) 100 UNIT/ML injection Inject 0.1 mLs (10 Units total) into the skin 2 (two) times daily at 8 am and 10 pm. For diabetes  management 11/15/17   Lindell Spar I, NP  isosorbide dinitrate (ISORDIL) 30 MG tablet Take 1 tablet (30 mg total) by mouth daily. For chest pain 11/15/17   Lindell Spar I, NP  lisinopril (PRINIVIL,ZESTRIL) 10 MG tablet Take 1 tablet by mouth daily. 12/12/17   [provider]  metFORMIN (GLUCOPHAGE-XR) 500 MG 24 hr tablet Take 1 tablet (500 mg total) by mouth 2 (two) times daily with a meal. For diabetes management 11/15/17   Lindell Spar I, NP  metoprolol tartrate (LOPRESSOR) 25 MG tablet Take 1 tablet (25 mg total) by mouth 2 (two) times daily. 12/28/17   Milton Ferguson, MD  paliperidone (INVEGA SUSTENNA) 234 MG/1.5ML SUSP injection Inject 234 mg into the muscle every 30 (thirty) days. (Due on 11-14-17): For mood control 11/15/17   Lindell Spar I, NP  pantoprazole (PROTONIX) 40 MG tablet Take 1 tablet (40 mg total) by mouth daily. For acid reflux 11/15/17   Lindell Spar I, NP  risperiDONE (RISPERDAL) 0.5 MG tablet Take 0.5 mg by mouth at bedtime.    [provider]  zolpidem (AMBIEN) 5 MG tablet Take 1 tablet (5 mg total) by mouth at bedtime as needed for sleep. 11/15/17   Encarnacion Slates, NP    Family History Family History  Problem Relation Age of Onset  . Heart disease Mother   . Heart disease Father   . Heart disease Maternal Grandmother   . Diabetes Maternal Grandmother     Social History Social History   Tobacco Use  . Smoking status: Current Every Day Smoker    Packs/day: 1.50    Types: Cigarettes  . Smokeless tobacco: Never Used  Substance Use Topics  . Alcohol use: Yes    Alcohol/week: 0.0 oz  . Drug use: Yes    Types: Cocaine     Allergies   Quetiapine; Seroquel [quetiapine fumarate]; Trazodone; Trazodone and nefazodone; and Haldol [haloperidol lactate]   Review of Systems Review of Systems  All systems reviewed and negative, other than as noted in HPI.  Physical Exam Updated Vital Signs BP (!) 152/82 (BP Location: Right Arm)   Pulse 77   Temp 98.1 F (36.7 C) (Oral)   Resp 14   Ht 5\' 11"  (1.803 m)   Wt 99.8 kg (220 lb)   SpO2 96%   BMI 30.68 kg/m   Physical Exam  Constitutional: He appears well-developed and well-nourished. No distress.  HENT:  Head: Normocephalic and atraumatic.  Eyes: Conjunctivae are normal. Right eye exhibits no discharge. Left eye exhibits no discharge.  Neck: Neck supple.  Cardiovascular: Normal rate, regular rhythm and normal heart sounds. Exam reveals no gallop and no friction rub.  No murmur heard. Pulmonary/Chest: Effort normal and breath sounds normal. No respiratory  distress.  Abdominal: Soft. He exhibits no distension. There is no tenderness.  Musculoskeletal:  Ambulatory in the emergency room.  Some mild tenderness to palpation the right lower back.  No midline spinal tenderness.  He is able to actively range the right hip without apparent difficulty.  Neurovascular intact.  Neurological: He is alert.  Skin: Skin is warm and dry.  Psychiatric: He has a normal mood and affect. His behavior is normal. Thought content normal.  Nursing note and vitals reviewed.    ED Treatments / Results  Labs (all labs ordered are listed, but only abnormal results are displayed) Labs Reviewed - No data to display  EKG None  Radiology No results found.  Procedures Procedures (including critical care time)  Medications Ordered in ED Medications  ibuprofen (ADVIL,MOTRIN) tablet 600 mg (600 mg Oral Given 04/13/18 1819)  dexamethasone (DECADRON) tablet 4 mg (4 mg Oral Given 04/13/18 1819)     Initial Impression / Assessment and Plan / ED Course  I have reviewed the triage vital signs and the nursing notes.  Pertinent labs & imaging results that were available during my care of the patient were reviewed by me and considered in my medical decision making (see chart for details).     57 year old male with chronic lower back/hip pain.  Symptomatic treatment.  I doubt fracture, acute spinal cord compression or other emergent pathology.  Final Clinical Impressions(s) / ED Diagnoses   Final diagnoses:  Chronic right-sided low back pain without sciatica    ED Discharge Orders    None       Virgel Manifold, MD 04/17/18 0020

## 2018-04-28 DIAGNOSIS — M25551 Pain in right hip: Secondary | ICD-10-CM | POA: Diagnosis not present

## 2018-04-28 DIAGNOSIS — M545 Low back pain: Secondary | ICD-10-CM | POA: Diagnosis not present

## 2018-04-28 DIAGNOSIS — I959 Hypotension, unspecified: Secondary | ICD-10-CM | POA: Diagnosis not present

## 2018-04-28 DIAGNOSIS — Z794 Long term (current) use of insulin: Secondary | ICD-10-CM | POA: Diagnosis not present

## 2018-04-28 DIAGNOSIS — I10 Essential (primary) hypertension: Secondary | ICD-10-CM | POA: Diagnosis not present

## 2018-04-28 DIAGNOSIS — E1165 Type 2 diabetes mellitus with hyperglycemia: Secondary | ICD-10-CM | POA: Diagnosis not present

## 2018-04-28 DIAGNOSIS — W19XXXA Unspecified fall, initial encounter: Secondary | ICD-10-CM | POA: Diagnosis not present

## 2018-04-28 DIAGNOSIS — Z79899 Other long term (current) drug therapy: Secondary | ICD-10-CM | POA: Diagnosis not present

## 2018-04-28 DIAGNOSIS — J209 Acute bronchitis, unspecified: Secondary | ICD-10-CM | POA: Diagnosis not present

## 2018-04-28 DIAGNOSIS — E119 Type 2 diabetes mellitus without complications: Secondary | ICD-10-CM | POA: Diagnosis not present

## 2018-04-28 DIAGNOSIS — R531 Weakness: Secondary | ICD-10-CM | POA: Diagnosis not present

## 2018-04-28 DIAGNOSIS — Z720 Tobacco use: Secondary | ICD-10-CM | POA: Diagnosis not present

## 2018-04-28 DIAGNOSIS — J440 Chronic obstructive pulmonary disease with acute lower respiratory infection: Secondary | ICD-10-CM | POA: Diagnosis not present

## 2018-04-28 DIAGNOSIS — F17200 Nicotine dependence, unspecified, uncomplicated: Secondary | ICD-10-CM | POA: Diagnosis not present

## 2018-05-04 DIAGNOSIS — J189 Pneumonia, unspecified organism: Secondary | ICD-10-CM | POA: Diagnosis not present

## 2018-05-04 DIAGNOSIS — F17200 Nicotine dependence, unspecified, uncomplicated: Secondary | ICD-10-CM | POA: Diagnosis not present

## 2018-05-04 DIAGNOSIS — J441 Chronic obstructive pulmonary disease with (acute) exacerbation: Secondary | ICD-10-CM | POA: Diagnosis not present

## 2018-05-04 DIAGNOSIS — I10 Essential (primary) hypertension: Secondary | ICD-10-CM | POA: Diagnosis not present

## 2018-05-04 DIAGNOSIS — Z79899 Other long term (current) drug therapy: Secondary | ICD-10-CM | POA: Diagnosis not present

## 2018-05-04 DIAGNOSIS — G4489 Other headache syndrome: Secondary | ICD-10-CM | POA: Diagnosis not present

## 2018-05-04 DIAGNOSIS — R51 Headache: Secondary | ICD-10-CM | POA: Diagnosis not present

## 2018-05-04 DIAGNOSIS — E119 Type 2 diabetes mellitus without complications: Secondary | ICD-10-CM | POA: Diagnosis not present

## 2018-05-04 DIAGNOSIS — Z720 Tobacco use: Secondary | ICD-10-CM | POA: Diagnosis not present

## 2018-05-04 DIAGNOSIS — Z794 Long term (current) use of insulin: Secondary | ICD-10-CM | POA: Diagnosis not present

## 2018-05-15 DIAGNOSIS — R45851 Suicidal ideations: Secondary | ICD-10-CM | POA: Diagnosis not present

## 2018-05-15 DIAGNOSIS — R1032 Left lower quadrant pain: Secondary | ICD-10-CM | POA: Diagnosis not present

## 2018-05-15 DIAGNOSIS — R457 State of emotional shock and stress, unspecified: Secondary | ICD-10-CM | POA: Diagnosis not present

## 2018-05-15 DIAGNOSIS — F29 Unspecified psychosis not due to a substance or known physiological condition: Secondary | ICD-10-CM | POA: Diagnosis not present

## 2018-05-16 DIAGNOSIS — F79 Unspecified intellectual disabilities: Secondary | ICD-10-CM | POA: Diagnosis not present

## 2018-05-17 DIAGNOSIS — Z79899 Other long term (current) drug therapy: Secondary | ICD-10-CM | POA: Diagnosis not present

## 2018-05-17 DIAGNOSIS — F329 Major depressive disorder, single episode, unspecified: Secondary | ICD-10-CM | POA: Diagnosis not present

## 2018-05-17 DIAGNOSIS — I959 Hypotension, unspecified: Secondary | ICD-10-CM | POA: Diagnosis not present

## 2018-05-17 DIAGNOSIS — F419 Anxiety disorder, unspecified: Secondary | ICD-10-CM | POA: Diagnosis not present

## 2018-05-17 DIAGNOSIS — E119 Type 2 diabetes mellitus without complications: Secondary | ICD-10-CM | POA: Diagnosis not present

## 2018-05-17 DIAGNOSIS — J449 Chronic obstructive pulmonary disease, unspecified: Secondary | ICD-10-CM | POA: Diagnosis not present

## 2018-05-17 DIAGNOSIS — Z794 Long term (current) use of insulin: Secondary | ICD-10-CM | POA: Diagnosis not present

## 2018-05-17 DIAGNOSIS — T50902A Poisoning by unspecified drugs, medicaments and biological substances, intentional self-harm, initial encounter: Secondary | ICD-10-CM | POA: Diagnosis not present

## 2018-05-17 DIAGNOSIS — R531 Weakness: Secondary | ICD-10-CM | POA: Diagnosis not present

## 2018-05-17 DIAGNOSIS — R45851 Suicidal ideations: Secondary | ICD-10-CM | POA: Diagnosis not present

## 2018-05-17 DIAGNOSIS — I10 Essential (primary) hypertension: Secondary | ICD-10-CM | POA: Diagnosis not present

## 2018-05-17 DIAGNOSIS — T447X1A Poisoning by beta-adrenoreceptor antagonists, accidental (unintentional), initial encounter: Secondary | ICD-10-CM | POA: Diagnosis not present

## 2018-05-17 DIAGNOSIS — F17200 Nicotine dependence, unspecified, uncomplicated: Secondary | ICD-10-CM | POA: Diagnosis not present

## 2018-05-18 DIAGNOSIS — I2510 Atherosclerotic heart disease of native coronary artery without angina pectoris: Secondary | ICD-10-CM | POA: Diagnosis present

## 2018-05-18 DIAGNOSIS — J449 Chronic obstructive pulmonary disease, unspecified: Secondary | ICD-10-CM | POA: Diagnosis present

## 2018-05-18 DIAGNOSIS — Z590 Homelessness: Secondary | ICD-10-CM | POA: Diagnosis not present

## 2018-05-18 DIAGNOSIS — F17210 Nicotine dependence, cigarettes, uncomplicated: Secondary | ICD-10-CM | POA: Diagnosis present

## 2018-05-18 DIAGNOSIS — G8929 Other chronic pain: Secondary | ICD-10-CM | POA: Diagnosis present

## 2018-05-18 DIAGNOSIS — F329 Major depressive disorder, single episode, unspecified: Secondary | ICD-10-CM | POA: Diagnosis not present

## 2018-05-18 DIAGNOSIS — Z794 Long term (current) use of insulin: Secondary | ICD-10-CM | POA: Diagnosis not present

## 2018-05-18 DIAGNOSIS — Z915 Personal history of self-harm: Secondary | ICD-10-CM | POA: Diagnosis not present

## 2018-05-18 DIAGNOSIS — E119 Type 2 diabetes mellitus without complications: Secondary | ICD-10-CM | POA: Diagnosis present

## 2018-05-18 DIAGNOSIS — F250 Schizoaffective disorder, bipolar type: Secondary | ICD-10-CM | POA: Diagnosis not present

## 2018-05-18 DIAGNOSIS — F200 Paranoid schizophrenia: Secondary | ICD-10-CM | POA: Diagnosis not present

## 2018-05-18 DIAGNOSIS — T50902A Poisoning by unspecified drugs, medicaments and biological substances, intentional self-harm, initial encounter: Secondary | ICD-10-CM | POA: Diagnosis not present

## 2018-05-18 DIAGNOSIS — F419 Anxiety disorder, unspecified: Secondary | ICD-10-CM | POA: Diagnosis not present

## 2018-05-18 DIAGNOSIS — F209 Schizophrenia, unspecified: Secondary | ICD-10-CM | POA: Diagnosis present

## 2018-05-18 DIAGNOSIS — I10 Essential (primary) hypertension: Secondary | ICD-10-CM | POA: Diagnosis present

## 2018-05-27 DIAGNOSIS — E119 Type 2 diabetes mellitus without complications: Secondary | ICD-10-CM | POA: Diagnosis not present

## 2018-05-27 DIAGNOSIS — R440 Auditory hallucinations: Secondary | ICD-10-CM | POA: Diagnosis not present

## 2018-05-27 DIAGNOSIS — J449 Chronic obstructive pulmonary disease, unspecified: Secondary | ICD-10-CM | POA: Diagnosis not present

## 2018-05-27 DIAGNOSIS — R002 Palpitations: Secondary | ICD-10-CM | POA: Diagnosis not present

## 2018-05-27 DIAGNOSIS — R069 Unspecified abnormalities of breathing: Secondary | ICD-10-CM | POA: Diagnosis not present

## 2018-05-27 DIAGNOSIS — F329 Major depressive disorder, single episode, unspecified: Secondary | ICD-10-CM | POA: Diagnosis not present

## 2018-05-27 DIAGNOSIS — F29 Unspecified psychosis not due to a substance or known physiological condition: Secondary | ICD-10-CM | POA: Diagnosis not present

## 2018-05-27 DIAGNOSIS — R51 Headache: Secondary | ICD-10-CM | POA: Diagnosis not present

## 2018-05-27 DIAGNOSIS — I10 Essential (primary) hypertension: Secondary | ICD-10-CM | POA: Diagnosis not present

## 2018-05-27 DIAGNOSIS — E1165 Type 2 diabetes mellitus with hyperglycemia: Secondary | ICD-10-CM | POA: Diagnosis not present

## 2018-05-27 DIAGNOSIS — R45851 Suicidal ideations: Secondary | ICD-10-CM | POA: Diagnosis not present

## 2018-05-27 DIAGNOSIS — F17200 Nicotine dependence, unspecified, uncomplicated: Secondary | ICD-10-CM | POA: Diagnosis not present

## 2018-05-27 DIAGNOSIS — Z79899 Other long term (current) drug therapy: Secondary | ICD-10-CM | POA: Diagnosis not present

## 2018-05-27 DIAGNOSIS — Z794 Long term (current) use of insulin: Secondary | ICD-10-CM | POA: Diagnosis not present

## 2018-05-27 DIAGNOSIS — R0902 Hypoxemia: Secondary | ICD-10-CM | POA: Diagnosis not present

## 2018-05-27 DIAGNOSIS — M25551 Pain in right hip: Secondary | ICD-10-CM | POA: Diagnosis not present

## 2018-05-27 DIAGNOSIS — F419 Anxiety disorder, unspecified: Secondary | ICD-10-CM | POA: Diagnosis not present

## 2018-05-28 DIAGNOSIS — F17200 Nicotine dependence, unspecified, uncomplicated: Secondary | ICD-10-CM | POA: Diagnosis not present

## 2018-05-28 DIAGNOSIS — R002 Palpitations: Secondary | ICD-10-CM | POA: Diagnosis not present

## 2018-05-28 DIAGNOSIS — F419 Anxiety disorder, unspecified: Secondary | ICD-10-CM | POA: Diagnosis not present

## 2018-05-28 DIAGNOSIS — F329 Major depressive disorder, single episode, unspecified: Secondary | ICD-10-CM | POA: Diagnosis not present

## 2018-05-28 DIAGNOSIS — R51 Headache: Secondary | ICD-10-CM | POA: Diagnosis not present

## 2018-05-28 DIAGNOSIS — J449 Chronic obstructive pulmonary disease, unspecified: Secondary | ICD-10-CM | POA: Diagnosis not present

## 2018-05-28 DIAGNOSIS — I10 Essential (primary) hypertension: Secondary | ICD-10-CM | POA: Diagnosis not present

## 2018-05-28 DIAGNOSIS — R5381 Other malaise: Secondary | ICD-10-CM | POA: Diagnosis not present

## 2018-05-28 DIAGNOSIS — Z79899 Other long term (current) drug therapy: Secondary | ICD-10-CM | POA: Diagnosis not present

## 2018-05-28 DIAGNOSIS — Z794 Long term (current) use of insulin: Secondary | ICD-10-CM | POA: Diagnosis not present

## 2018-05-28 DIAGNOSIS — R45851 Suicidal ideations: Secondary | ICD-10-CM | POA: Diagnosis not present

## 2018-05-28 DIAGNOSIS — M25551 Pain in right hip: Secondary | ICD-10-CM | POA: Diagnosis not present

## 2018-05-28 DIAGNOSIS — Z008 Encounter for other general examination: Secondary | ICD-10-CM | POA: Diagnosis not present

## 2018-05-28 DIAGNOSIS — E119 Type 2 diabetes mellitus without complications: Secondary | ICD-10-CM | POA: Diagnosis not present

## 2018-05-29 DIAGNOSIS — Z915 Personal history of self-harm: Secondary | ICD-10-CM | POA: Diagnosis not present

## 2018-05-29 DIAGNOSIS — M1990 Unspecified osteoarthritis, unspecified site: Secondary | ICD-10-CM | POA: Diagnosis present

## 2018-05-29 DIAGNOSIS — R45851 Suicidal ideations: Secondary | ICD-10-CM | POA: Diagnosis present

## 2018-05-29 DIAGNOSIS — F411 Generalized anxiety disorder: Secondary | ICD-10-CM | POA: Diagnosis present

## 2018-05-29 DIAGNOSIS — Z79899 Other long term (current) drug therapy: Secondary | ICD-10-CM | POA: Diagnosis not present

## 2018-05-29 DIAGNOSIS — E119 Type 2 diabetes mellitus without complications: Secondary | ICD-10-CM | POA: Diagnosis present

## 2018-05-29 DIAGNOSIS — F332 Major depressive disorder, recurrent severe without psychotic features: Secondary | ICD-10-CM | POA: Diagnosis not present

## 2018-05-29 DIAGNOSIS — Z794 Long term (current) use of insulin: Secondary | ICD-10-CM | POA: Diagnosis not present

## 2018-05-29 DIAGNOSIS — M25551 Pain in right hip: Secondary | ICD-10-CM | POA: Diagnosis not present

## 2018-05-29 DIAGNOSIS — R002 Palpitations: Secondary | ICD-10-CM | POA: Diagnosis not present

## 2018-05-29 DIAGNOSIS — J45909 Unspecified asthma, uncomplicated: Secondary | ICD-10-CM | POA: Diagnosis present

## 2018-05-29 DIAGNOSIS — F329 Major depressive disorder, single episode, unspecified: Secondary | ICD-10-CM | POA: Diagnosis present

## 2018-05-29 DIAGNOSIS — F17210 Nicotine dependence, cigarettes, uncomplicated: Secondary | ICD-10-CM | POA: Diagnosis present

## 2018-05-29 DIAGNOSIS — Z590 Homelessness: Secondary | ICD-10-CM | POA: Diagnosis not present

## 2018-06-09 DIAGNOSIS — E1165 Type 2 diabetes mellitus with hyperglycemia: Secondary | ICD-10-CM | POA: Diagnosis not present

## 2018-06-09 DIAGNOSIS — I10 Essential (primary) hypertension: Secondary | ICD-10-CM | POA: Diagnosis not present

## 2018-06-16 DIAGNOSIS — Z79899 Other long term (current) drug therapy: Secondary | ICD-10-CM | POA: Diagnosis not present

## 2018-06-20 DIAGNOSIS — G894 Chronic pain syndrome: Secondary | ICD-10-CM | POA: Diagnosis not present

## 2018-06-20 DIAGNOSIS — F419 Anxiety disorder, unspecified: Secondary | ICD-10-CM | POA: Diagnosis not present

## 2018-06-24 DIAGNOSIS — Z5320 Procedure and treatment not carried out because of patient's decision for unspecified reasons: Secondary | ICD-10-CM | POA: Diagnosis not present

## 2018-06-24 DIAGNOSIS — W19XXXA Unspecified fall, initial encounter: Secondary | ICD-10-CM | POA: Diagnosis not present

## 2018-06-24 DIAGNOSIS — R109 Unspecified abdominal pain: Secondary | ICD-10-CM | POA: Diagnosis not present

## 2018-06-24 DIAGNOSIS — Y999 Unspecified external cause status: Secondary | ICD-10-CM | POA: Diagnosis not present

## 2018-06-24 DIAGNOSIS — R7309 Other abnormal glucose: Secondary | ICD-10-CM | POA: Diagnosis not present

## 2018-06-24 DIAGNOSIS — M545 Low back pain: Secondary | ICD-10-CM | POA: Diagnosis not present

## 2018-06-24 DIAGNOSIS — G8929 Other chronic pain: Secondary | ICD-10-CM | POA: Diagnosis not present

## 2018-06-24 DIAGNOSIS — M25551 Pain in right hip: Secondary | ICD-10-CM | POA: Diagnosis not present

## 2018-06-24 DIAGNOSIS — R072 Precordial pain: Secondary | ICD-10-CM | POA: Diagnosis not present

## 2018-06-24 DIAGNOSIS — R079 Chest pain, unspecified: Secondary | ICD-10-CM | POA: Diagnosis not present

## 2018-06-24 DIAGNOSIS — R3912 Poor urinary stream: Secondary | ICD-10-CM | POA: Diagnosis not present

## 2018-06-25 DIAGNOSIS — R079 Chest pain, unspecified: Secondary | ICD-10-CM | POA: Diagnosis not present

## 2018-06-25 DIAGNOSIS — R072 Precordial pain: Secondary | ICD-10-CM | POA: Diagnosis not present

## 2018-06-25 DIAGNOSIS — M25551 Pain in right hip: Secondary | ICD-10-CM | POA: Diagnosis not present

## 2018-06-25 DIAGNOSIS — G8929 Other chronic pain: Secondary | ICD-10-CM | POA: Diagnosis not present

## 2018-06-25 DIAGNOSIS — M545 Low back pain: Secondary | ICD-10-CM | POA: Diagnosis not present

## 2018-06-28 DIAGNOSIS — F419 Anxiety disorder, unspecified: Secondary | ICD-10-CM | POA: Diagnosis not present

## 2018-06-28 DIAGNOSIS — E1165 Type 2 diabetes mellitus with hyperglycemia: Secondary | ICD-10-CM | POA: Diagnosis not present

## 2018-06-28 DIAGNOSIS — Z79899 Other long term (current) drug therapy: Secondary | ICD-10-CM | POA: Diagnosis not present

## 2018-06-28 DIAGNOSIS — E119 Type 2 diabetes mellitus without complications: Secondary | ICD-10-CM | POA: Diagnosis not present

## 2018-06-28 DIAGNOSIS — Z590 Homelessness: Secondary | ICD-10-CM | POA: Diagnosis not present

## 2018-06-28 DIAGNOSIS — Z794 Long term (current) use of insulin: Secondary | ICD-10-CM | POA: Diagnosis not present

## 2018-06-28 DIAGNOSIS — F329 Major depressive disorder, single episode, unspecified: Secondary | ICD-10-CM | POA: Diagnosis not present

## 2018-06-28 DIAGNOSIS — J449 Chronic obstructive pulmonary disease, unspecified: Secondary | ICD-10-CM | POA: Diagnosis not present

## 2018-06-28 DIAGNOSIS — F17210 Nicotine dependence, cigarettes, uncomplicated: Secondary | ICD-10-CM | POA: Diagnosis not present

## 2018-06-28 DIAGNOSIS — I10 Essential (primary) hypertension: Secondary | ICD-10-CM | POA: Diagnosis not present

## 2018-06-28 DIAGNOSIS — Z888 Allergy status to other drugs, medicaments and biological substances status: Secondary | ICD-10-CM | POA: Diagnosis not present

## 2018-06-28 DIAGNOSIS — E871 Hypo-osmolality and hyponatremia: Secondary | ICD-10-CM | POA: Diagnosis not present

## 2018-06-28 DIAGNOSIS — R45851 Suicidal ideations: Secondary | ICD-10-CM | POA: Diagnosis not present

## 2018-07-02 DIAGNOSIS — Z79899 Other long term (current) drug therapy: Secondary | ICD-10-CM | POA: Diagnosis not present

## 2018-07-02 DIAGNOSIS — E162 Hypoglycemia, unspecified: Secondary | ICD-10-CM | POA: Diagnosis not present

## 2018-07-02 DIAGNOSIS — E119 Type 2 diabetes mellitus without complications: Secondary | ICD-10-CM | POA: Diagnosis not present

## 2018-07-02 DIAGNOSIS — R252 Cramp and spasm: Secondary | ICD-10-CM | POA: Diagnosis not present

## 2018-07-02 DIAGNOSIS — F17200 Nicotine dependence, unspecified, uncomplicated: Secondary | ICD-10-CM | POA: Diagnosis not present

## 2018-07-02 DIAGNOSIS — J449 Chronic obstructive pulmonary disease, unspecified: Secondary | ICD-10-CM | POA: Diagnosis not present

## 2018-07-02 DIAGNOSIS — Z794 Long term (current) use of insulin: Secondary | ICD-10-CM | POA: Diagnosis not present

## 2018-07-02 DIAGNOSIS — E161 Other hypoglycemia: Secondary | ICD-10-CM | POA: Diagnosis not present

## 2018-07-02 DIAGNOSIS — R52 Pain, unspecified: Secondary | ICD-10-CM | POA: Diagnosis not present

## 2018-07-02 DIAGNOSIS — F419 Anxiety disorder, unspecified: Secondary | ICD-10-CM | POA: Diagnosis not present

## 2018-07-02 DIAGNOSIS — E11649 Type 2 diabetes mellitus with hypoglycemia without coma: Secondary | ICD-10-CM | POA: Diagnosis not present

## 2018-07-02 DIAGNOSIS — I10 Essential (primary) hypertension: Secondary | ICD-10-CM | POA: Diagnosis not present

## 2018-07-02 DIAGNOSIS — F329 Major depressive disorder, single episode, unspecified: Secondary | ICD-10-CM | POA: Diagnosis not present

## 2018-07-03 DIAGNOSIS — R45851 Suicidal ideations: Secondary | ICD-10-CM | POA: Diagnosis not present

## 2018-07-03 DIAGNOSIS — F17200 Nicotine dependence, unspecified, uncomplicated: Secondary | ICD-10-CM | POA: Diagnosis not present

## 2018-07-03 DIAGNOSIS — E1042 Type 1 diabetes mellitus with diabetic polyneuropathy: Secondary | ICD-10-CM | POA: Diagnosis not present

## 2018-07-03 DIAGNOSIS — Z79899 Other long term (current) drug therapy: Secondary | ICD-10-CM | POA: Diagnosis not present

## 2018-07-03 DIAGNOSIS — E1165 Type 2 diabetes mellitus with hyperglycemia: Secondary | ICD-10-CM | POA: Diagnosis not present

## 2018-07-03 DIAGNOSIS — Z794 Long term (current) use of insulin: Secondary | ICD-10-CM | POA: Diagnosis not present

## 2018-07-03 DIAGNOSIS — F329 Major depressive disorder, single episode, unspecified: Secondary | ICD-10-CM | POA: Diagnosis not present

## 2018-07-03 DIAGNOSIS — Z7951 Long term (current) use of inhaled steroids: Secondary | ICD-10-CM | POA: Diagnosis not present

## 2018-07-03 DIAGNOSIS — F29 Unspecified psychosis not due to a substance or known physiological condition: Secondary | ICD-10-CM | POA: Diagnosis not present

## 2018-07-03 DIAGNOSIS — J449 Chronic obstructive pulmonary disease, unspecified: Secondary | ICD-10-CM | POA: Diagnosis not present

## 2018-07-03 DIAGNOSIS — F419 Anxiety disorder, unspecified: Secondary | ICD-10-CM | POA: Diagnosis not present

## 2018-07-03 DIAGNOSIS — I10 Essential (primary) hypertension: Secondary | ICD-10-CM | POA: Diagnosis not present

## 2018-07-04 DIAGNOSIS — N400 Enlarged prostate without lower urinary tract symptoms: Secondary | ICD-10-CM | POA: Diagnosis present

## 2018-07-04 DIAGNOSIS — F250 Schizoaffective disorder, bipolar type: Secondary | ICD-10-CM | POA: Diagnosis present

## 2018-07-04 DIAGNOSIS — I2510 Atherosclerotic heart disease of native coronary artery without angina pectoris: Secondary | ICD-10-CM | POA: Diagnosis present

## 2018-07-04 DIAGNOSIS — F329 Major depressive disorder, single episode, unspecified: Secondary | ICD-10-CM | POA: Diagnosis not present

## 2018-07-04 DIAGNOSIS — J449 Chronic obstructive pulmonary disease, unspecified: Secondary | ICD-10-CM | POA: Diagnosis present

## 2018-07-04 DIAGNOSIS — E1165 Type 2 diabetes mellitus with hyperglycemia: Secondary | ICD-10-CM | POA: Diagnosis not present

## 2018-07-04 DIAGNOSIS — Z9114 Patient's other noncompliance with medication regimen: Secondary | ICD-10-CM | POA: Diagnosis not present

## 2018-07-04 DIAGNOSIS — F419 Anxiety disorder, unspecified: Secondary | ICD-10-CM | POA: Diagnosis not present

## 2018-07-04 DIAGNOSIS — K5900 Constipation, unspecified: Secondary | ICD-10-CM | POA: Diagnosis present

## 2018-07-04 DIAGNOSIS — R110 Nausea: Secondary | ICD-10-CM | POA: Diagnosis not present

## 2018-07-04 DIAGNOSIS — E1042 Type 1 diabetes mellitus with diabetic polyneuropathy: Secondary | ICD-10-CM | POA: Diagnosis not present

## 2018-07-04 DIAGNOSIS — M519 Unspecified thoracic, thoracolumbar and lumbosacral intervertebral disc disorder: Secondary | ICD-10-CM | POA: Diagnosis present

## 2018-07-04 DIAGNOSIS — I10 Essential (primary) hypertension: Secondary | ICD-10-CM | POA: Diagnosis present

## 2018-07-04 DIAGNOSIS — R45851 Suicidal ideations: Secondary | ICD-10-CM | POA: Diagnosis present

## 2018-07-04 DIAGNOSIS — Z794 Long term (current) use of insulin: Secondary | ICD-10-CM | POA: Diagnosis not present

## 2018-07-04 DIAGNOSIS — Z590 Homelessness: Secondary | ICD-10-CM | POA: Diagnosis not present

## 2018-07-04 DIAGNOSIS — R1011 Right upper quadrant pain: Secondary | ICD-10-CM | POA: Diagnosis not present

## 2018-07-04 DIAGNOSIS — F332 Major depressive disorder, recurrent severe without psychotic features: Secondary | ICD-10-CM | POA: Diagnosis not present

## 2018-07-04 DIAGNOSIS — E119 Type 2 diabetes mellitus without complications: Secondary | ICD-10-CM | POA: Diagnosis present

## 2018-07-04 DIAGNOSIS — F17210 Nicotine dependence, cigarettes, uncomplicated: Secondary | ICD-10-CM | POA: Diagnosis present

## 2018-07-12 DIAGNOSIS — E1165 Type 2 diabetes mellitus with hyperglycemia: Secondary | ICD-10-CM | POA: Diagnosis not present

## 2018-07-12 DIAGNOSIS — R110 Nausea: Secondary | ICD-10-CM | POA: Diagnosis not present

## 2018-07-12 DIAGNOSIS — R1011 Right upper quadrant pain: Secondary | ICD-10-CM | POA: Diagnosis not present

## 2018-07-13 DIAGNOSIS — Z794 Long term (current) use of insulin: Secondary | ICD-10-CM | POA: Diagnosis not present

## 2018-07-13 DIAGNOSIS — F329 Major depressive disorder, single episode, unspecified: Secondary | ICD-10-CM | POA: Diagnosis not present

## 2018-07-13 DIAGNOSIS — Z7951 Long term (current) use of inhaled steroids: Secondary | ICD-10-CM | POA: Diagnosis not present

## 2018-07-13 DIAGNOSIS — I10 Essential (primary) hypertension: Secondary | ICD-10-CM | POA: Diagnosis not present

## 2018-07-13 DIAGNOSIS — R52 Pain, unspecified: Secondary | ICD-10-CM | POA: Diagnosis not present

## 2018-07-13 DIAGNOSIS — R1011 Right upper quadrant pain: Secondary | ICD-10-CM | POA: Diagnosis not present

## 2018-07-13 DIAGNOSIS — J449 Chronic obstructive pulmonary disease, unspecified: Secondary | ICD-10-CM | POA: Diagnosis not present

## 2018-07-13 DIAGNOSIS — F419 Anxiety disorder, unspecified: Secondary | ICD-10-CM | POA: Diagnosis not present

## 2018-07-13 DIAGNOSIS — E1165 Type 2 diabetes mellitus with hyperglycemia: Secondary | ICD-10-CM | POA: Diagnosis not present

## 2018-07-13 DIAGNOSIS — R531 Weakness: Secondary | ICD-10-CM | POA: Diagnosis not present

## 2018-07-13 DIAGNOSIS — R252 Cramp and spasm: Secondary | ICD-10-CM | POA: Diagnosis not present

## 2018-07-13 DIAGNOSIS — Z79899 Other long term (current) drug therapy: Secondary | ICD-10-CM | POA: Diagnosis not present

## 2018-07-18 DIAGNOSIS — M5136 Other intervertebral disc degeneration, lumbar region: Secondary | ICD-10-CM | POA: Diagnosis not present

## 2018-07-18 DIAGNOSIS — Z79899 Other long term (current) drug therapy: Secondary | ICD-10-CM | POA: Diagnosis not present

## 2018-07-18 DIAGNOSIS — R52 Pain, unspecified: Secondary | ICD-10-CM | POA: Diagnosis not present

## 2018-07-18 DIAGNOSIS — M545 Low back pain: Secondary | ICD-10-CM | POA: Diagnosis not present

## 2018-07-18 DIAGNOSIS — Z794 Long term (current) use of insulin: Secondary | ICD-10-CM | POA: Diagnosis not present

## 2018-07-18 DIAGNOSIS — F17200 Nicotine dependence, unspecified, uncomplicated: Secondary | ICD-10-CM | POA: Diagnosis not present

## 2018-07-18 DIAGNOSIS — J449 Chronic obstructive pulmonary disease, unspecified: Secondary | ICD-10-CM | POA: Diagnosis not present

## 2018-07-18 DIAGNOSIS — M25511 Pain in right shoulder: Secondary | ICD-10-CM | POA: Diagnosis not present

## 2018-07-18 DIAGNOSIS — E119 Type 2 diabetes mellitus without complications: Secondary | ICD-10-CM | POA: Diagnosis not present

## 2018-07-18 DIAGNOSIS — I10 Essential (primary) hypertension: Secondary | ICD-10-CM | POA: Diagnosis not present

## 2018-07-18 DIAGNOSIS — M479 Spondylosis, unspecified: Secondary | ICD-10-CM | POA: Diagnosis not present

## 2018-07-18 DIAGNOSIS — M25551 Pain in right hip: Secondary | ICD-10-CM | POA: Diagnosis not present

## 2018-07-18 DIAGNOSIS — F419 Anxiety disorder, unspecified: Secondary | ICD-10-CM | POA: Diagnosis not present

## 2018-07-18 DIAGNOSIS — Z7951 Long term (current) use of inhaled steroids: Secondary | ICD-10-CM | POA: Diagnosis not present

## 2018-07-18 DIAGNOSIS — F329 Major depressive disorder, single episode, unspecified: Secondary | ICD-10-CM | POA: Diagnosis not present

## 2018-07-19 DIAGNOSIS — R45850 Homicidal ideations: Secondary | ICD-10-CM | POA: Diagnosis not present

## 2018-07-19 DIAGNOSIS — E119 Type 2 diabetes mellitus without complications: Secondary | ICD-10-CM | POA: Diagnosis not present

## 2018-07-19 DIAGNOSIS — Z590 Homelessness: Secondary | ICD-10-CM | POA: Diagnosis not present

## 2018-07-19 DIAGNOSIS — Z915 Personal history of self-harm: Secondary | ICD-10-CM | POA: Diagnosis not present

## 2018-07-19 DIAGNOSIS — G629 Polyneuropathy, unspecified: Secondary | ICD-10-CM | POA: Diagnosis not present

## 2018-07-19 DIAGNOSIS — F17210 Nicotine dependence, cigarettes, uncomplicated: Secondary | ICD-10-CM | POA: Diagnosis present

## 2018-07-19 DIAGNOSIS — Z888 Allergy status to other drugs, medicaments and biological substances status: Secondary | ICD-10-CM | POA: Diagnosis not present

## 2018-07-19 DIAGNOSIS — M549 Dorsalgia, unspecified: Secondary | ICD-10-CM | POA: Diagnosis present

## 2018-07-19 DIAGNOSIS — M5489 Other dorsalgia: Secondary | ICD-10-CM | POA: Diagnosis not present

## 2018-07-19 DIAGNOSIS — I10 Essential (primary) hypertension: Secondary | ICD-10-CM | POA: Diagnosis not present

## 2018-07-19 DIAGNOSIS — F29 Unspecified psychosis not due to a substance or known physiological condition: Secondary | ICD-10-CM | POA: Diagnosis not present

## 2018-07-19 DIAGNOSIS — F209 Schizophrenia, unspecified: Secondary | ICD-10-CM | POA: Diagnosis not present

## 2018-07-19 DIAGNOSIS — G8929 Other chronic pain: Secondary | ICD-10-CM | POA: Diagnosis present

## 2018-07-19 DIAGNOSIS — Z794 Long term (current) use of insulin: Secondary | ICD-10-CM | POA: Diagnosis not present

## 2018-07-19 DIAGNOSIS — Z79899 Other long term (current) drug therapy: Secondary | ICD-10-CM | POA: Diagnosis not present

## 2018-07-19 DIAGNOSIS — E1142 Type 2 diabetes mellitus with diabetic polyneuropathy: Secondary | ICD-10-CM | POA: Diagnosis present

## 2018-07-19 DIAGNOSIS — Z791 Long term (current) use of non-steroidal anti-inflammatories (NSAID): Secondary | ICD-10-CM | POA: Diagnosis not present

## 2018-07-19 DIAGNOSIS — J449 Chronic obstructive pulmonary disease, unspecified: Secondary | ICD-10-CM | POA: Diagnosis not present

## 2018-07-19 DIAGNOSIS — R45851 Suicidal ideations: Secondary | ICD-10-CM | POA: Diagnosis not present

## 2018-07-27 DIAGNOSIS — Z0389 Encounter for observation for other suspected diseases and conditions ruled out: Secondary | ICD-10-CM | POA: Diagnosis not present

## 2018-07-28 DIAGNOSIS — R531 Weakness: Secondary | ICD-10-CM | POA: Diagnosis not present

## 2018-07-28 DIAGNOSIS — E871 Hypo-osmolality and hyponatremia: Secondary | ICD-10-CM | POA: Diagnosis not present

## 2018-07-28 DIAGNOSIS — R339 Retention of urine, unspecified: Secondary | ICD-10-CM | POA: Diagnosis not present

## 2018-07-28 DIAGNOSIS — N289 Disorder of kidney and ureter, unspecified: Secondary | ICD-10-CM | POA: Diagnosis not present

## 2018-07-28 DIAGNOSIS — B372 Candidiasis of skin and nail: Secondary | ICD-10-CM | POA: Diagnosis not present

## 2018-07-28 DIAGNOSIS — F209 Schizophrenia, unspecified: Secondary | ICD-10-CM | POA: Diagnosis not present

## 2018-07-28 DIAGNOSIS — G9341 Metabolic encephalopathy: Secondary | ICD-10-CM | POA: Diagnosis not present

## 2018-07-28 DIAGNOSIS — E1165 Type 2 diabetes mellitus with hyperglycemia: Secondary | ICD-10-CM | POA: Diagnosis not present

## 2018-07-28 DIAGNOSIS — R4182 Altered mental status, unspecified: Secondary | ICD-10-CM | POA: Diagnosis not present

## 2018-07-28 DIAGNOSIS — I10 Essential (primary) hypertension: Secondary | ICD-10-CM | POA: Diagnosis not present

## 2018-07-29 DIAGNOSIS — R4182 Altered mental status, unspecified: Secondary | ICD-10-CM | POA: Diagnosis not present

## 2018-07-29 DIAGNOSIS — E871 Hypo-osmolality and hyponatremia: Secondary | ICD-10-CM | POA: Diagnosis not present

## 2018-07-29 DIAGNOSIS — I10 Essential (primary) hypertension: Secondary | ICD-10-CM | POA: Diagnosis not present

## 2018-07-29 DIAGNOSIS — S72091A Other fracture of head and neck of right femur, initial encounter for closed fracture: Secondary | ICD-10-CM | POA: Diagnosis not present

## 2018-07-29 DIAGNOSIS — F209 Schizophrenia, unspecified: Secondary | ICD-10-CM | POA: Diagnosis not present

## 2018-07-29 DIAGNOSIS — W19XXXA Unspecified fall, initial encounter: Secondary | ICD-10-CM | POA: Diagnosis not present

## 2018-07-29 DIAGNOSIS — R339 Retention of urine, unspecified: Secondary | ICD-10-CM | POA: Diagnosis not present

## 2018-07-29 DIAGNOSIS — R410 Disorientation, unspecified: Secondary | ICD-10-CM | POA: Diagnosis not present

## 2018-07-29 DIAGNOSIS — R937 Abnormal findings on diagnostic imaging of other parts of musculoskeletal system: Secondary | ICD-10-CM | POA: Diagnosis not present

## 2018-07-29 DIAGNOSIS — R062 Wheezing: Secondary | ICD-10-CM | POA: Diagnosis not present

## 2018-07-29 DIAGNOSIS — E1165 Type 2 diabetes mellitus with hyperglycemia: Secondary | ICD-10-CM | POA: Diagnosis not present

## 2018-07-29 DIAGNOSIS — N289 Disorder of kidney and ureter, unspecified: Secondary | ICD-10-CM | POA: Diagnosis not present

## 2018-07-30 DIAGNOSIS — E1165 Type 2 diabetes mellitus with hyperglycemia: Secondary | ICD-10-CM | POA: Diagnosis not present

## 2018-07-30 DIAGNOSIS — N289 Disorder of kidney and ureter, unspecified: Secondary | ICD-10-CM | POA: Diagnosis not present

## 2018-07-30 DIAGNOSIS — E871 Hypo-osmolality and hyponatremia: Secondary | ICD-10-CM | POA: Diagnosis not present

## 2018-07-30 DIAGNOSIS — R4182 Altered mental status, unspecified: Secondary | ICD-10-CM | POA: Diagnosis not present

## 2018-07-30 DIAGNOSIS — R410 Disorientation, unspecified: Secondary | ICD-10-CM | POA: Diagnosis not present

## 2018-07-30 DIAGNOSIS — G9341 Metabolic encephalopathy: Secondary | ICD-10-CM | POA: Diagnosis not present

## 2018-07-30 DIAGNOSIS — F209 Schizophrenia, unspecified: Secondary | ICD-10-CM | POA: Diagnosis not present

## 2018-07-30 DIAGNOSIS — I10 Essential (primary) hypertension: Secondary | ICD-10-CM | POA: Diagnosis not present

## 2018-07-30 DIAGNOSIS — R339 Retention of urine, unspecified: Secondary | ICD-10-CM | POA: Diagnosis not present

## 2018-07-31 DIAGNOSIS — R410 Disorientation, unspecified: Secondary | ICD-10-CM | POA: Diagnosis not present

## 2018-07-31 DIAGNOSIS — N289 Disorder of kidney and ureter, unspecified: Secondary | ICD-10-CM | POA: Diagnosis not present

## 2018-07-31 DIAGNOSIS — E1165 Type 2 diabetes mellitus with hyperglycemia: Secondary | ICD-10-CM | POA: Diagnosis not present

## 2018-07-31 DIAGNOSIS — E871 Hypo-osmolality and hyponatremia: Secondary | ICD-10-CM | POA: Diagnosis not present

## 2018-07-31 DIAGNOSIS — R339 Retention of urine, unspecified: Secondary | ICD-10-CM | POA: Diagnosis not present

## 2018-07-31 DIAGNOSIS — I10 Essential (primary) hypertension: Secondary | ICD-10-CM | POA: Diagnosis not present

## 2018-07-31 DIAGNOSIS — F209 Schizophrenia, unspecified: Secondary | ICD-10-CM | POA: Diagnosis not present

## 2018-08-01 DIAGNOSIS — R937 Abnormal findings on diagnostic imaging of other parts of musculoskeletal system: Secondary | ICD-10-CM | POA: Diagnosis not present

## 2018-08-01 DIAGNOSIS — S72091A Other fracture of head and neck of right femur, initial encounter for closed fracture: Secondary | ICD-10-CM | POA: Diagnosis not present

## 2018-08-01 DIAGNOSIS — R062 Wheezing: Secondary | ICD-10-CM | POA: Diagnosis not present

## 2018-08-01 DIAGNOSIS — I10 Essential (primary) hypertension: Secondary | ICD-10-CM | POA: Diagnosis not present

## 2018-08-01 DIAGNOSIS — W19XXXA Unspecified fall, initial encounter: Secondary | ICD-10-CM | POA: Diagnosis not present

## 2018-08-01 DIAGNOSIS — N289 Disorder of kidney and ureter, unspecified: Secondary | ICD-10-CM | POA: Diagnosis not present

## 2018-08-01 DIAGNOSIS — R4182 Altered mental status, unspecified: Secondary | ICD-10-CM | POA: Diagnosis not present

## 2018-08-01 DIAGNOSIS — R339 Retention of urine, unspecified: Secondary | ICD-10-CM | POA: Diagnosis not present

## 2018-08-01 DIAGNOSIS — R410 Disorientation, unspecified: Secondary | ICD-10-CM | POA: Diagnosis not present

## 2018-08-01 DIAGNOSIS — E1165 Type 2 diabetes mellitus with hyperglycemia: Secondary | ICD-10-CM | POA: Diagnosis not present

## 2018-08-01 DIAGNOSIS — E871 Hypo-osmolality and hyponatremia: Secondary | ICD-10-CM | POA: Diagnosis not present

## 2018-08-01 DIAGNOSIS — F209 Schizophrenia, unspecified: Secondary | ICD-10-CM | POA: Diagnosis not present

## 2018-08-02 DIAGNOSIS — F209 Schizophrenia, unspecified: Secondary | ICD-10-CM | POA: Diagnosis not present

## 2018-08-02 DIAGNOSIS — R062 Wheezing: Secondary | ICD-10-CM | POA: Diagnosis not present

## 2018-08-02 DIAGNOSIS — W19XXXA Unspecified fall, initial encounter: Secondary | ICD-10-CM | POA: Diagnosis not present

## 2018-08-02 DIAGNOSIS — E1165 Type 2 diabetes mellitus with hyperglycemia: Secondary | ICD-10-CM | POA: Diagnosis not present

## 2018-08-02 DIAGNOSIS — I10 Essential (primary) hypertension: Secondary | ICD-10-CM | POA: Diagnosis not present

## 2018-08-02 DIAGNOSIS — S72091A Other fracture of head and neck of right femur, initial encounter for closed fracture: Secondary | ICD-10-CM | POA: Diagnosis not present

## 2018-08-02 DIAGNOSIS — R339 Retention of urine, unspecified: Secondary | ICD-10-CM | POA: Diagnosis not present

## 2018-08-02 DIAGNOSIS — N289 Disorder of kidney and ureter, unspecified: Secondary | ICD-10-CM | POA: Diagnosis not present

## 2018-08-02 DIAGNOSIS — R4182 Altered mental status, unspecified: Secondary | ICD-10-CM | POA: Diagnosis not present

## 2018-08-02 DIAGNOSIS — E871 Hypo-osmolality and hyponatremia: Secondary | ICD-10-CM | POA: Diagnosis not present

## 2018-08-02 DIAGNOSIS — R937 Abnormal findings on diagnostic imaging of other parts of musculoskeletal system: Secondary | ICD-10-CM | POA: Diagnosis not present

## 2018-08-02 DIAGNOSIS — R410 Disorientation, unspecified: Secondary | ICD-10-CM | POA: Diagnosis not present

## 2018-08-03 DIAGNOSIS — N289 Disorder of kidney and ureter, unspecified: Secondary | ICD-10-CM | POA: Diagnosis not present

## 2018-08-03 DIAGNOSIS — R339 Retention of urine, unspecified: Secondary | ICD-10-CM | POA: Diagnosis not present

## 2018-08-03 DIAGNOSIS — E1165 Type 2 diabetes mellitus with hyperglycemia: Secondary | ICD-10-CM | POA: Diagnosis not present

## 2018-08-03 DIAGNOSIS — G9341 Metabolic encephalopathy: Secondary | ICD-10-CM | POA: Diagnosis not present

## 2018-08-03 DIAGNOSIS — B372 Candidiasis of skin and nail: Secondary | ICD-10-CM | POA: Diagnosis not present

## 2018-08-03 DIAGNOSIS — E871 Hypo-osmolality and hyponatremia: Secondary | ICD-10-CM | POA: Diagnosis not present

## 2018-08-03 DIAGNOSIS — F209 Schizophrenia, unspecified: Secondary | ICD-10-CM | POA: Diagnosis not present

## 2018-08-03 DIAGNOSIS — I10 Essential (primary) hypertension: Secondary | ICD-10-CM | POA: Diagnosis not present

## 2018-08-04 DIAGNOSIS — G9341 Metabolic encephalopathy: Secondary | ICD-10-CM | POA: Diagnosis not present

## 2018-08-04 DIAGNOSIS — F209 Schizophrenia, unspecified: Secondary | ICD-10-CM | POA: Diagnosis not present

## 2018-08-15 DIAGNOSIS — Z9119 Patient's noncompliance with other medical treatment and regimen: Secondary | ICD-10-CM | POA: Diagnosis not present

## 2018-08-15 DIAGNOSIS — E1165 Type 2 diabetes mellitus with hyperglycemia: Secondary | ICD-10-CM | POA: Diagnosis not present

## 2018-08-15 DIAGNOSIS — F209 Schizophrenia, unspecified: Secondary | ICD-10-CM | POA: Diagnosis not present

## 2018-08-15 DIAGNOSIS — Z720 Tobacco use: Secondary | ICD-10-CM | POA: Diagnosis not present

## 2018-08-15 DIAGNOSIS — Z794 Long term (current) use of insulin: Secondary | ICD-10-CM | POA: Diagnosis not present

## 2018-08-15 DIAGNOSIS — I10 Essential (primary) hypertension: Secondary | ICD-10-CM | POA: Diagnosis not present

## 2018-08-17 DIAGNOSIS — G479 Sleep disorder, unspecified: Secondary | ICD-10-CM | POA: Diagnosis not present

## 2018-08-17 DIAGNOSIS — K277 Chronic peptic ulcer, site unspecified, without hemorrhage or perforation: Secondary | ICD-10-CM | POA: Diagnosis not present

## 2018-08-17 DIAGNOSIS — E1140 Type 2 diabetes mellitus with diabetic neuropathy, unspecified: Secondary | ICD-10-CM | POA: Diagnosis not present

## 2018-08-17 DIAGNOSIS — I10 Essential (primary) hypertension: Secondary | ICD-10-CM | POA: Diagnosis not present

## 2018-08-17 DIAGNOSIS — M25552 Pain in left hip: Secondary | ICD-10-CM | POA: Diagnosis not present

## 2018-08-17 DIAGNOSIS — E782 Mixed hyperlipidemia: Secondary | ICD-10-CM | POA: Diagnosis not present

## 2018-08-17 DIAGNOSIS — M545 Low back pain: Secondary | ICD-10-CM | POA: Diagnosis not present

## 2018-08-17 DIAGNOSIS — E8770 Fluid overload, unspecified: Secondary | ICD-10-CM | POA: Diagnosis not present

## 2018-08-17 DIAGNOSIS — F419 Anxiety disorder, unspecified: Secondary | ICD-10-CM | POA: Diagnosis not present

## 2018-08-17 DIAGNOSIS — M25551 Pain in right hip: Secondary | ICD-10-CM | POA: Diagnosis not present

## 2018-08-17 DIAGNOSIS — E1165 Type 2 diabetes mellitus with hyperglycemia: Secondary | ICD-10-CM | POA: Diagnosis not present

## 2018-08-26 DIAGNOSIS — M25551 Pain in right hip: Secondary | ICD-10-CM | POA: Diagnosis not present

## 2018-08-28 DIAGNOSIS — Z7951 Long term (current) use of inhaled steroids: Secondary | ICD-10-CM | POA: Diagnosis not present

## 2018-08-28 DIAGNOSIS — M71551 Other bursitis, not elsewhere classified, right hip: Secondary | ICD-10-CM | POA: Diagnosis not present

## 2018-08-28 DIAGNOSIS — E1165 Type 2 diabetes mellitus with hyperglycemia: Secondary | ICD-10-CM | POA: Diagnosis not present

## 2018-08-28 DIAGNOSIS — Z794 Long term (current) use of insulin: Secondary | ICD-10-CM | POA: Diagnosis not present

## 2018-08-28 DIAGNOSIS — M7071 Other bursitis of hip, right hip: Secondary | ICD-10-CM | POA: Diagnosis not present

## 2018-08-28 DIAGNOSIS — F419 Anxiety disorder, unspecified: Secondary | ICD-10-CM | POA: Diagnosis not present

## 2018-08-28 DIAGNOSIS — E119 Type 2 diabetes mellitus without complications: Secondary | ICD-10-CM | POA: Diagnosis not present

## 2018-08-28 DIAGNOSIS — I10 Essential (primary) hypertension: Secondary | ICD-10-CM | POA: Diagnosis not present

## 2018-08-28 DIAGNOSIS — F17200 Nicotine dependence, unspecified, uncomplicated: Secondary | ICD-10-CM | POA: Diagnosis not present

## 2018-08-28 DIAGNOSIS — F329 Major depressive disorder, single episode, unspecified: Secondary | ICD-10-CM | POA: Diagnosis not present

## 2018-08-28 DIAGNOSIS — Z79899 Other long term (current) drug therapy: Secondary | ICD-10-CM | POA: Diagnosis not present

## 2018-08-28 DIAGNOSIS — J449 Chronic obstructive pulmonary disease, unspecified: Secondary | ICD-10-CM | POA: Diagnosis not present

## 2018-09-06 DIAGNOSIS — E119 Type 2 diabetes mellitus without complications: Secondary | ICD-10-CM | POA: Diagnosis not present

## 2018-09-06 DIAGNOSIS — M7061 Trochanteric bursitis, right hip: Secondary | ICD-10-CM | POA: Diagnosis not present

## 2018-09-06 DIAGNOSIS — I10 Essential (primary) hypertension: Secondary | ICD-10-CM | POA: Diagnosis not present

## 2018-09-06 DIAGNOSIS — M7601 Gluteal tendinitis, right hip: Secondary | ICD-10-CM | POA: Diagnosis not present

## 2018-09-06 DIAGNOSIS — F17200 Nicotine dependence, unspecified, uncomplicated: Secondary | ICD-10-CM | POA: Diagnosis not present

## 2018-09-06 DIAGNOSIS — R5381 Other malaise: Secondary | ICD-10-CM | POA: Diagnosis not present

## 2018-09-06 DIAGNOSIS — R52 Pain, unspecified: Secondary | ICD-10-CM | POA: Diagnosis not present

## 2018-09-06 DIAGNOSIS — M25551 Pain in right hip: Secondary | ICD-10-CM | POA: Diagnosis not present

## 2018-09-06 DIAGNOSIS — J449 Chronic obstructive pulmonary disease, unspecified: Secondary | ICD-10-CM | POA: Diagnosis not present

## 2018-09-11 DIAGNOSIS — E119 Type 2 diabetes mellitus without complications: Secondary | ICD-10-CM | POA: Diagnosis not present

## 2018-09-11 DIAGNOSIS — R0789 Other chest pain: Secondary | ICD-10-CM | POA: Diagnosis not present

## 2018-09-11 DIAGNOSIS — Z888 Allergy status to other drugs, medicaments and biological substances status: Secondary | ICD-10-CM | POA: Diagnosis not present

## 2018-09-11 DIAGNOSIS — F17210 Nicotine dependence, cigarettes, uncomplicated: Secondary | ICD-10-CM | POA: Diagnosis not present

## 2018-09-11 DIAGNOSIS — R419 Unspecified symptoms and signs involving cognitive functions and awareness: Secondary | ICD-10-CM | POA: Diagnosis not present

## 2018-09-11 DIAGNOSIS — R079 Chest pain, unspecified: Secondary | ICD-10-CM | POA: Diagnosis not present

## 2018-09-11 DIAGNOSIS — Z886 Allergy status to analgesic agent status: Secondary | ICD-10-CM | POA: Diagnosis not present

## 2018-09-11 DIAGNOSIS — R0689 Other abnormalities of breathing: Secondary | ICD-10-CM | POA: Diagnosis not present

## 2018-09-11 DIAGNOSIS — I10 Essential (primary) hypertension: Secondary | ICD-10-CM | POA: Diagnosis not present

## 2018-09-11 DIAGNOSIS — Z794 Long term (current) use of insulin: Secondary | ICD-10-CM | POA: Diagnosis not present

## 2018-09-11 DIAGNOSIS — E1165 Type 2 diabetes mellitus with hyperglycemia: Secondary | ICD-10-CM | POA: Diagnosis not present

## 2018-09-11 DIAGNOSIS — F419 Anxiety disorder, unspecified: Secondary | ICD-10-CM | POA: Diagnosis not present

## 2018-09-11 DIAGNOSIS — F329 Major depressive disorder, single episode, unspecified: Secondary | ICD-10-CM | POA: Diagnosis not present

## 2018-09-11 DIAGNOSIS — R0602 Shortness of breath: Secondary | ICD-10-CM | POA: Diagnosis not present

## 2018-09-11 DIAGNOSIS — Z590 Homelessness: Secondary | ICD-10-CM | POA: Diagnosis not present

## 2018-09-11 DIAGNOSIS — Z79899 Other long term (current) drug therapy: Secondary | ICD-10-CM | POA: Diagnosis not present

## 2018-09-11 DIAGNOSIS — J449 Chronic obstructive pulmonary disease, unspecified: Secondary | ICD-10-CM | POA: Diagnosis not present

## 2018-09-12 ENCOUNTER — Ambulatory Visit: Payer: Self-pay | Admitting: Family Medicine

## 2018-09-12 DIAGNOSIS — R45851 Suicidal ideations: Secondary | ICD-10-CM | POA: Diagnosis not present

## 2018-09-12 DIAGNOSIS — F29 Unspecified psychosis not due to a substance or known physiological condition: Secondary | ICD-10-CM | POA: Diagnosis not present

## 2018-09-12 DIAGNOSIS — Z79899 Other long term (current) drug therapy: Secondary | ICD-10-CM | POA: Diagnosis not present

## 2018-09-12 DIAGNOSIS — Z720 Tobacco use: Secondary | ICD-10-CM | POA: Diagnosis not present

## 2018-09-12 DIAGNOSIS — T887XXA Unspecified adverse effect of drug or medicament, initial encounter: Secondary | ICD-10-CM | POA: Diagnosis not present

## 2018-09-12 DIAGNOSIS — E119 Type 2 diabetes mellitus without complications: Secondary | ICD-10-CM | POA: Diagnosis not present

## 2018-09-12 DIAGNOSIS — Z794 Long term (current) use of insulin: Secondary | ICD-10-CM | POA: Diagnosis not present

## 2018-09-12 DIAGNOSIS — F329 Major depressive disorder, single episode, unspecified: Secondary | ICD-10-CM | POA: Diagnosis not present

## 2018-09-12 DIAGNOSIS — J449 Chronic obstructive pulmonary disease, unspecified: Secondary | ICD-10-CM | POA: Diagnosis not present

## 2018-09-12 DIAGNOSIS — I10 Essential (primary) hypertension: Secondary | ICD-10-CM | POA: Diagnosis not present

## 2018-09-12 DIAGNOSIS — R110 Nausea: Secondary | ICD-10-CM | POA: Diagnosis not present

## 2018-09-13 ENCOUNTER — Ambulatory Visit: Payer: Self-pay | Admitting: Family Medicine

## 2018-09-13 DIAGNOSIS — Z0289 Encounter for other administrative examinations: Secondary | ICD-10-CM

## 2018-09-29 DIAGNOSIS — R531 Weakness: Secondary | ICD-10-CM | POA: Diagnosis not present

## 2018-09-29 DIAGNOSIS — F17200 Nicotine dependence, unspecified, uncomplicated: Secondary | ICD-10-CM | POA: Diagnosis not present

## 2018-09-29 DIAGNOSIS — E1165 Type 2 diabetes mellitus with hyperglycemia: Secondary | ICD-10-CM | POA: Diagnosis not present

## 2018-09-29 DIAGNOSIS — J449 Chronic obstructive pulmonary disease, unspecified: Secondary | ICD-10-CM | POA: Diagnosis not present

## 2018-09-29 DIAGNOSIS — R51 Headache: Secondary | ICD-10-CM | POA: Diagnosis not present

## 2018-09-29 DIAGNOSIS — F329 Major depressive disorder, single episode, unspecified: Secondary | ICD-10-CM | POA: Diagnosis not present

## 2018-09-29 DIAGNOSIS — Z79899 Other long term (current) drug therapy: Secondary | ICD-10-CM | POA: Diagnosis not present

## 2018-09-29 DIAGNOSIS — R252 Cramp and spasm: Secondary | ICD-10-CM | POA: Diagnosis not present

## 2018-09-29 DIAGNOSIS — G4489 Other headache syndrome: Secondary | ICD-10-CM | POA: Diagnosis not present

## 2018-09-29 DIAGNOSIS — M533 Sacrococcygeal disorders, not elsewhere classified: Secondary | ICD-10-CM | POA: Diagnosis not present

## 2018-09-29 DIAGNOSIS — R110 Nausea: Secondary | ICD-10-CM | POA: Diagnosis not present

## 2018-09-29 DIAGNOSIS — E119 Type 2 diabetes mellitus without complications: Secondary | ICD-10-CM | POA: Diagnosis not present

## 2018-09-29 DIAGNOSIS — I10 Essential (primary) hypertension: Secondary | ICD-10-CM | POA: Diagnosis not present

## 2018-09-29 DIAGNOSIS — F419 Anxiety disorder, unspecified: Secondary | ICD-10-CM | POA: Diagnosis not present

## 2018-10-02 DIAGNOSIS — E119 Type 2 diabetes mellitus without complications: Secondary | ICD-10-CM | POA: Diagnosis not present

## 2018-10-02 DIAGNOSIS — F17200 Nicotine dependence, unspecified, uncomplicated: Secondary | ICD-10-CM | POA: Diagnosis not present

## 2018-10-02 DIAGNOSIS — I10 Essential (primary) hypertension: Secondary | ICD-10-CM | POA: Diagnosis not present

## 2018-10-02 DIAGNOSIS — J449 Chronic obstructive pulmonary disease, unspecified: Secondary | ICD-10-CM | POA: Diagnosis not present

## 2018-10-02 DIAGNOSIS — R002 Palpitations: Secondary | ICD-10-CM | POA: Diagnosis not present

## 2018-10-02 DIAGNOSIS — B86 Scabies: Secondary | ICD-10-CM | POA: Diagnosis not present

## 2018-10-02 DIAGNOSIS — R457 State of emotional shock and stress, unspecified: Secondary | ICD-10-CM | POA: Diagnosis not present

## 2018-10-02 DIAGNOSIS — Z539 Procedure and treatment not carried out, unspecified reason: Secondary | ICD-10-CM | POA: Diagnosis not present

## 2018-10-02 DIAGNOSIS — F29 Unspecified psychosis not due to a substance or known physiological condition: Secondary | ICD-10-CM | POA: Diagnosis not present

## 2018-10-23 DIAGNOSIS — R52 Pain, unspecified: Secondary | ICD-10-CM | POA: Diagnosis not present

## 2018-10-23 DIAGNOSIS — R457 State of emotional shock and stress, unspecified: Secondary | ICD-10-CM | POA: Diagnosis not present

## 2018-10-23 DIAGNOSIS — R450 Nervousness: Secondary | ICD-10-CM | POA: Diagnosis not present

## 2018-10-23 DIAGNOSIS — R0602 Shortness of breath: Secondary | ICD-10-CM | POA: Diagnosis not present

## 2018-10-27 DIAGNOSIS — F209 Schizophrenia, unspecified: Secondary | ICD-10-CM | POA: Diagnosis not present

## 2018-10-27 DIAGNOSIS — E222 Syndrome of inappropriate secretion of antidiuretic hormone: Secondary | ICD-10-CM | POA: Diagnosis present

## 2018-10-27 DIAGNOSIS — E8881 Metabolic syndrome: Secondary | ICD-10-CM | POA: Diagnosis not present

## 2018-10-27 DIAGNOSIS — E11319 Type 2 diabetes mellitus with unspecified diabetic retinopathy without macular edema: Secondary | ICD-10-CM | POA: Diagnosis present

## 2018-10-27 DIAGNOSIS — N289 Disorder of kidney and ureter, unspecified: Secondary | ICD-10-CM | POA: Diagnosis not present

## 2018-10-27 DIAGNOSIS — Z046 Encounter for general psychiatric examination, requested by authority: Secondary | ICD-10-CM | POA: Diagnosis not present

## 2018-10-27 DIAGNOSIS — Z794 Long term (current) use of insulin: Secondary | ICD-10-CM | POA: Diagnosis not present

## 2018-10-27 DIAGNOSIS — F17200 Nicotine dependence, unspecified, uncomplicated: Secondary | ICD-10-CM | POA: Diagnosis not present

## 2018-10-27 DIAGNOSIS — I129 Hypertensive chronic kidney disease with stage 1 through stage 4 chronic kidney disease, or unspecified chronic kidney disease: Secondary | ICD-10-CM | POA: Diagnosis present

## 2018-10-27 DIAGNOSIS — J441 Chronic obstructive pulmonary disease with (acute) exacerbation: Secondary | ICD-10-CM | POA: Diagnosis not present

## 2018-10-27 DIAGNOSIS — Z79899 Other long term (current) drug therapy: Secondary | ICD-10-CM | POA: Diagnosis not present

## 2018-10-27 DIAGNOSIS — R05 Cough: Secondary | ICD-10-CM | POA: Diagnosis not present

## 2018-10-27 DIAGNOSIS — F411 Generalized anxiety disorder: Secondary | ICD-10-CM | POA: Diagnosis present

## 2018-10-27 DIAGNOSIS — R0600 Dyspnea, unspecified: Secondary | ICD-10-CM | POA: Diagnosis not present

## 2018-10-27 DIAGNOSIS — E1165 Type 2 diabetes mellitus with hyperglycemia: Secondary | ICD-10-CM | POA: Diagnosis not present

## 2018-10-27 DIAGNOSIS — R739 Hyperglycemia, unspecified: Secondary | ICD-10-CM | POA: Diagnosis not present

## 2018-10-27 DIAGNOSIS — R140 Abdominal distension (gaseous): Secondary | ICD-10-CM | POA: Diagnosis not present

## 2018-10-27 DIAGNOSIS — I10 Essential (primary) hypertension: Secondary | ICD-10-CM | POA: Diagnosis not present

## 2018-10-27 DIAGNOSIS — J181 Lobar pneumonia, unspecified organism: Secondary | ICD-10-CM | POA: Diagnosis not present

## 2018-10-27 DIAGNOSIS — M79605 Pain in left leg: Secondary | ICD-10-CM | POA: Diagnosis not present

## 2018-10-27 DIAGNOSIS — Z6835 Body mass index (BMI) 35.0-35.9, adult: Secondary | ICD-10-CM | POA: Diagnosis not present

## 2018-10-27 DIAGNOSIS — K5909 Other constipation: Secondary | ICD-10-CM | POA: Diagnosis not present

## 2018-10-27 DIAGNOSIS — J440 Chronic obstructive pulmonary disease with acute lower respiratory infection: Secondary | ICD-10-CM | POA: Diagnosis not present

## 2018-10-27 DIAGNOSIS — Z7951 Long term (current) use of inhaled steroids: Secondary | ICD-10-CM | POA: Diagnosis not present

## 2018-10-27 DIAGNOSIS — E1122 Type 2 diabetes mellitus with diabetic chronic kidney disease: Secondary | ICD-10-CM | POA: Diagnosis present

## 2018-10-27 DIAGNOSIS — J189 Pneumonia, unspecified organism: Secondary | ICD-10-CM | POA: Diagnosis not present

## 2018-10-27 DIAGNOSIS — F17210 Nicotine dependence, cigarettes, uncomplicated: Secondary | ICD-10-CM | POA: Diagnosis present

## 2018-10-27 DIAGNOSIS — T380X5A Adverse effect of glucocorticoids and synthetic analogues, initial encounter: Secondary | ICD-10-CM | POA: Diagnosis not present

## 2018-10-27 DIAGNOSIS — K5900 Constipation, unspecified: Secondary | ICD-10-CM | POA: Diagnosis not present

## 2018-10-27 DIAGNOSIS — E669 Obesity, unspecified: Secondary | ICD-10-CM | POA: Diagnosis present

## 2018-10-27 DIAGNOSIS — R0602 Shortness of breath: Secondary | ICD-10-CM | POA: Diagnosis not present

## 2018-10-27 DIAGNOSIS — E11649 Type 2 diabetes mellitus with hypoglycemia without coma: Secondary | ICD-10-CM | POA: Diagnosis not present

## 2018-10-27 DIAGNOSIS — E871 Hypo-osmolality and hyponatremia: Secondary | ICD-10-CM | POA: Diagnosis not present

## 2018-10-27 DIAGNOSIS — K5901 Slow transit constipation: Secondary | ICD-10-CM | POA: Diagnosis not present

## 2018-10-27 DIAGNOSIS — M79604 Pain in right leg: Secondary | ICD-10-CM | POA: Diagnosis not present

## 2018-10-27 DIAGNOSIS — F200 Paranoid schizophrenia: Secondary | ICD-10-CM | POA: Diagnosis not present

## 2018-10-27 DIAGNOSIS — N183 Chronic kidney disease, stage 3 (moderate): Secondary | ICD-10-CM | POA: Diagnosis present

## 2018-11-13 DIAGNOSIS — M25551 Pain in right hip: Secondary | ICD-10-CM | POA: Diagnosis not present

## 2018-11-13 DIAGNOSIS — G8929 Other chronic pain: Secondary | ICD-10-CM | POA: Diagnosis not present

## 2018-11-13 DIAGNOSIS — Z590 Homelessness: Secondary | ICD-10-CM | POA: Diagnosis not present

## 2018-11-13 DIAGNOSIS — I10 Essential (primary) hypertension: Secondary | ICD-10-CM | POA: Diagnosis not present

## 2018-11-13 DIAGNOSIS — F250 Schizoaffective disorder, bipolar type: Secondary | ICD-10-CM | POA: Diagnosis not present

## 2018-11-13 DIAGNOSIS — J449 Chronic obstructive pulmonary disease, unspecified: Secondary | ICD-10-CM | POA: Diagnosis not present

## 2018-11-13 DIAGNOSIS — F17210 Nicotine dependence, cigarettes, uncomplicated: Secondary | ICD-10-CM | POA: Diagnosis not present

## 2018-11-13 DIAGNOSIS — M79604 Pain in right leg: Secondary | ICD-10-CM | POA: Diagnosis not present

## 2018-11-17 DIAGNOSIS — R194 Change in bowel habit: Secondary | ICD-10-CM | POA: Diagnosis not present

## 2018-11-17 DIAGNOSIS — K5900 Constipation, unspecified: Secondary | ICD-10-CM | POA: Diagnosis not present

## 2018-11-17 DIAGNOSIS — J449 Chronic obstructive pulmonary disease, unspecified: Secondary | ICD-10-CM | POA: Diagnosis not present

## 2018-11-17 DIAGNOSIS — R0602 Shortness of breath: Secondary | ICD-10-CM | POA: Diagnosis not present

## 2018-11-21 DIAGNOSIS — E119 Type 2 diabetes mellitus without complications: Secondary | ICD-10-CM | POA: Diagnosis not present

## 2018-11-21 DIAGNOSIS — Z885 Allergy status to narcotic agent status: Secondary | ICD-10-CM | POA: Diagnosis not present

## 2018-11-21 DIAGNOSIS — I10 Essential (primary) hypertension: Secondary | ICD-10-CM | POA: Diagnosis not present

## 2018-11-21 DIAGNOSIS — Z79899 Other long term (current) drug therapy: Secondary | ICD-10-CM | POA: Diagnosis not present

## 2018-11-21 DIAGNOSIS — R0789 Other chest pain: Secondary | ICD-10-CM | POA: Diagnosis not present

## 2018-11-21 DIAGNOSIS — F602 Antisocial personality disorder: Secondary | ICD-10-CM | POA: Diagnosis not present

## 2018-11-21 DIAGNOSIS — J449 Chronic obstructive pulmonary disease, unspecified: Secondary | ICD-10-CM | POA: Diagnosis not present

## 2018-11-21 DIAGNOSIS — F29 Unspecified psychosis not due to a substance or known physiological condition: Secondary | ICD-10-CM | POA: Diagnosis not present

## 2018-11-21 DIAGNOSIS — F17210 Nicotine dependence, cigarettes, uncomplicated: Secondary | ICD-10-CM | POA: Diagnosis not present

## 2018-11-21 DIAGNOSIS — Z5321 Procedure and treatment not carried out due to patient leaving prior to being seen by health care provider: Secondary | ICD-10-CM | POA: Diagnosis not present

## 2018-11-21 DIAGNOSIS — F918 Other conduct disorders: Secondary | ICD-10-CM | POA: Diagnosis not present

## 2018-11-21 DIAGNOSIS — R45851 Suicidal ideations: Secondary | ICD-10-CM | POA: Diagnosis not present

## 2018-11-21 DIAGNOSIS — F329 Major depressive disorder, single episode, unspecified: Secondary | ICD-10-CM | POA: Diagnosis not present

## 2018-11-21 DIAGNOSIS — T887XXA Unspecified adverse effect of drug or medicament, initial encounter: Secondary | ICD-10-CM | POA: Diagnosis not present

## 2018-11-21 DIAGNOSIS — F419 Anxiety disorder, unspecified: Secondary | ICD-10-CM | POA: Diagnosis not present

## 2018-11-21 DIAGNOSIS — Z888 Allergy status to other drugs, medicaments and biological substances status: Secondary | ICD-10-CM | POA: Diagnosis not present

## 2018-11-21 DIAGNOSIS — R079 Chest pain, unspecified: Secondary | ICD-10-CM | POA: Diagnosis not present

## 2018-11-29 DIAGNOSIS — F17210 Nicotine dependence, cigarettes, uncomplicated: Secondary | ICD-10-CM | POA: Diagnosis not present

## 2018-11-29 DIAGNOSIS — F419 Anxiety disorder, unspecified: Secondary | ICD-10-CM | POA: Diagnosis not present

## 2018-11-29 DIAGNOSIS — Z79899 Other long term (current) drug therapy: Secondary | ICD-10-CM | POA: Diagnosis not present

## 2018-11-29 DIAGNOSIS — E119 Type 2 diabetes mellitus without complications: Secondary | ICD-10-CM | POA: Diagnosis not present

## 2018-11-29 DIAGNOSIS — F329 Major depressive disorder, single episode, unspecified: Secondary | ICD-10-CM | POA: Diagnosis not present

## 2018-11-29 DIAGNOSIS — Z886 Allergy status to analgesic agent status: Secondary | ICD-10-CM | POA: Diagnosis not present

## 2018-11-29 DIAGNOSIS — R45851 Suicidal ideations: Secondary | ICD-10-CM | POA: Diagnosis not present

## 2018-11-29 DIAGNOSIS — R42 Dizziness and giddiness: Secondary | ICD-10-CM | POA: Diagnosis not present

## 2018-11-29 DIAGNOSIS — J449 Chronic obstructive pulmonary disease, unspecified: Secondary | ICD-10-CM | POA: Diagnosis not present

## 2018-11-29 DIAGNOSIS — E1165 Type 2 diabetes mellitus with hyperglycemia: Secondary | ICD-10-CM | POA: Diagnosis not present

## 2018-11-29 DIAGNOSIS — I10 Essential (primary) hypertension: Secondary | ICD-10-CM | POA: Diagnosis not present

## 2018-11-29 DIAGNOSIS — Z888 Allergy status to other drugs, medicaments and biological substances status: Secondary | ICD-10-CM | POA: Diagnosis not present

## 2018-11-29 DIAGNOSIS — T43592A Poisoning by other antipsychotics and neuroleptics, intentional self-harm, initial encounter: Secondary | ICD-10-CM | POA: Diagnosis not present

## 2018-11-29 DIAGNOSIS — T887XXA Unspecified adverse effect of drug or medicament, initial encounter: Secondary | ICD-10-CM | POA: Diagnosis not present

## 2018-11-29 DIAGNOSIS — R05 Cough: Secondary | ICD-10-CM | POA: Diagnosis not present

## 2018-11-29 DIAGNOSIS — T50904A Poisoning by unspecified drugs, medicaments and biological substances, undetermined, initial encounter: Secondary | ICD-10-CM | POA: Diagnosis not present

## 2018-11-29 DIAGNOSIS — R000 Tachycardia, unspecified: Secondary | ICD-10-CM | POA: Diagnosis not present

## 2018-11-30 DIAGNOSIS — R05 Cough: Secondary | ICD-10-CM | POA: Diagnosis not present

## 2018-12-13 DIAGNOSIS — F17210 Nicotine dependence, cigarettes, uncomplicated: Secondary | ICD-10-CM | POA: Diagnosis not present

## 2018-12-13 DIAGNOSIS — E119 Type 2 diabetes mellitus without complications: Secondary | ICD-10-CM | POA: Diagnosis not present

## 2018-12-13 DIAGNOSIS — Z79899 Other long term (current) drug therapy: Secondary | ICD-10-CM | POA: Diagnosis not present

## 2018-12-13 DIAGNOSIS — J449 Chronic obstructive pulmonary disease, unspecified: Secondary | ICD-10-CM | POA: Diagnosis not present

## 2018-12-13 DIAGNOSIS — M25572 Pain in left ankle and joints of left foot: Secondary | ICD-10-CM | POA: Diagnosis not present

## 2018-12-13 DIAGNOSIS — R600 Localized edema: Secondary | ICD-10-CM | POA: Diagnosis not present

## 2018-12-13 DIAGNOSIS — Z5321 Procedure and treatment not carried out due to patient leaving prior to being seen by health care provider: Secondary | ICD-10-CM | POA: Diagnosis not present

## 2018-12-13 DIAGNOSIS — R52 Pain, unspecified: Secondary | ICD-10-CM | POA: Diagnosis not present

## 2018-12-13 DIAGNOSIS — F419 Anxiety disorder, unspecified: Secondary | ICD-10-CM | POA: Diagnosis not present

## 2018-12-13 DIAGNOSIS — I10 Essential (primary) hypertension: Secondary | ICD-10-CM | POA: Diagnosis not present

## 2018-12-13 DIAGNOSIS — M25551 Pain in right hip: Secondary | ICD-10-CM | POA: Diagnosis not present

## 2018-12-13 DIAGNOSIS — R609 Edema, unspecified: Secondary | ICD-10-CM | POA: Diagnosis not present

## 2018-12-19 DIAGNOSIS — E1165 Type 2 diabetes mellitus with hyperglycemia: Secondary | ICD-10-CM | POA: Diagnosis not present

## 2018-12-19 DIAGNOSIS — R0602 Shortness of breath: Secondary | ICD-10-CM | POA: Diagnosis not present

## 2018-12-19 DIAGNOSIS — R069 Unspecified abnormalities of breathing: Secondary | ICD-10-CM | POA: Diagnosis not present

## 2018-12-19 DIAGNOSIS — R739 Hyperglycemia, unspecified: Secondary | ICD-10-CM | POA: Diagnosis not present

## 2018-12-19 DIAGNOSIS — R4182 Altered mental status, unspecified: Secondary | ICD-10-CM | POA: Diagnosis not present

## 2018-12-19 DIAGNOSIS — R000 Tachycardia, unspecified: Secondary | ICD-10-CM | POA: Diagnosis not present

## 2018-12-27 DIAGNOSIS — L89302 Pressure ulcer of unspecified buttock, stage 2: Secondary | ICD-10-CM | POA: Diagnosis not present

## 2019-01-10 DIAGNOSIS — F17200 Nicotine dependence, unspecified, uncomplicated: Secondary | ICD-10-CM | POA: Diagnosis not present

## 2019-01-10 DIAGNOSIS — R5381 Other malaise: Secondary | ICD-10-CM | POA: Diagnosis not present

## 2019-01-10 DIAGNOSIS — L89321 Pressure ulcer of left buttock, stage 1: Secondary | ICD-10-CM | POA: Diagnosis not present

## 2019-01-10 DIAGNOSIS — E1165 Type 2 diabetes mellitus with hyperglycemia: Secondary | ICD-10-CM | POA: Diagnosis not present

## 2019-01-10 DIAGNOSIS — Z888 Allergy status to other drugs, medicaments and biological substances status: Secondary | ICD-10-CM | POA: Diagnosis not present

## 2019-01-10 DIAGNOSIS — I10 Essential (primary) hypertension: Secondary | ICD-10-CM | POA: Diagnosis not present

## 2019-01-10 DIAGNOSIS — J449 Chronic obstructive pulmonary disease, unspecified: Secondary | ICD-10-CM | POA: Diagnosis not present

## 2019-01-10 DIAGNOSIS — L97511 Non-pressure chronic ulcer of other part of right foot limited to breakdown of skin: Secondary | ICD-10-CM | POA: Diagnosis not present

## 2019-01-10 DIAGNOSIS — E119 Type 2 diabetes mellitus without complications: Secondary | ICD-10-CM | POA: Diagnosis not present

## 2019-01-10 DIAGNOSIS — L97411 Non-pressure chronic ulcer of right heel and midfoot limited to breakdown of skin: Secondary | ICD-10-CM | POA: Diagnosis not present

## 2019-01-10 DIAGNOSIS — L97419 Non-pressure chronic ulcer of right heel and midfoot with unspecified severity: Secondary | ICD-10-CM | POA: Diagnosis not present

## 2019-01-16 DIAGNOSIS — E11621 Type 2 diabetes mellitus with foot ulcer: Secondary | ICD-10-CM | POA: Diagnosis not present

## 2019-01-16 DIAGNOSIS — L97412 Non-pressure chronic ulcer of right heel and midfoot with fat layer exposed: Secondary | ICD-10-CM | POA: Diagnosis not present

## 2019-01-19 DIAGNOSIS — I10 Essential (primary) hypertension: Secondary | ICD-10-CM | POA: Diagnosis not present

## 2019-01-19 DIAGNOSIS — F17200 Nicotine dependence, unspecified, uncomplicated: Secondary | ICD-10-CM | POA: Diagnosis not present

## 2019-01-19 DIAGNOSIS — R5381 Other malaise: Secondary | ICD-10-CM | POA: Diagnosis not present

## 2019-01-19 DIAGNOSIS — Z590 Homelessness: Secondary | ICD-10-CM | POA: Diagnosis not present

## 2019-01-19 DIAGNOSIS — L97411 Non-pressure chronic ulcer of right heel and midfoot limited to breakdown of skin: Secondary | ICD-10-CM | POA: Diagnosis not present

## 2019-01-19 DIAGNOSIS — J449 Chronic obstructive pulmonary disease, unspecified: Secondary | ICD-10-CM | POA: Diagnosis not present

## 2019-01-19 DIAGNOSIS — L89891 Pressure ulcer of other site, stage 1: Secondary | ICD-10-CM | POA: Diagnosis not present

## 2019-01-19 DIAGNOSIS — E11621 Type 2 diabetes mellitus with foot ulcer: Secondary | ICD-10-CM | POA: Diagnosis not present

## 2019-01-19 DIAGNOSIS — L89309 Pressure ulcer of unspecified buttock, unspecified stage: Secondary | ICD-10-CM | POA: Diagnosis not present

## 2019-01-19 DIAGNOSIS — F329 Major depressive disorder, single episode, unspecified: Secondary | ICD-10-CM | POA: Diagnosis not present

## 2019-01-19 DIAGNOSIS — Z79899 Other long term (current) drug therapy: Secondary | ICD-10-CM | POA: Diagnosis not present

## 2019-01-19 DIAGNOSIS — E13621 Other specified diabetes mellitus with foot ulcer: Secondary | ICD-10-CM | POA: Diagnosis not present

## 2019-01-19 DIAGNOSIS — T447X4A Poisoning by beta-adrenoreceptor antagonists, undetermined, initial encounter: Secondary | ICD-10-CM | POA: Diagnosis not present

## 2019-01-19 DIAGNOSIS — F419 Anxiety disorder, unspecified: Secondary | ICD-10-CM | POA: Diagnosis not present

## 2019-01-19 DIAGNOSIS — R52 Pain, unspecified: Secondary | ICD-10-CM | POA: Diagnosis not present

## 2019-01-20 DIAGNOSIS — E871 Hypo-osmolality and hyponatremia: Secondary | ICD-10-CM | POA: Diagnosis not present

## 2019-01-20 DIAGNOSIS — Z590 Homelessness: Secondary | ICD-10-CM | POA: Diagnosis not present

## 2019-01-20 DIAGNOSIS — R531 Weakness: Secondary | ICD-10-CM | POA: Diagnosis not present

## 2019-01-20 DIAGNOSIS — T447X1A Poisoning by beta-adrenoreceptor antagonists, accidental (unintentional), initial encounter: Secondary | ICD-10-CM | POA: Diagnosis not present

## 2019-01-20 DIAGNOSIS — J449 Chronic obstructive pulmonary disease, unspecified: Secondary | ICD-10-CM | POA: Diagnosis not present

## 2019-01-20 DIAGNOSIS — I10 Essential (primary) hypertension: Secondary | ICD-10-CM | POA: Diagnosis not present

## 2019-01-20 DIAGNOSIS — F419 Anxiety disorder, unspecified: Secondary | ICD-10-CM | POA: Diagnosis not present

## 2019-01-20 DIAGNOSIS — F329 Major depressive disorder, single episode, unspecified: Secondary | ICD-10-CM | POA: Diagnosis not present

## 2019-01-20 DIAGNOSIS — Z79899 Other long term (current) drug therapy: Secondary | ICD-10-CM | POA: Diagnosis not present

## 2019-01-20 DIAGNOSIS — F17200 Nicotine dependence, unspecified, uncomplicated: Secondary | ICD-10-CM | POA: Diagnosis not present

## 2019-01-20 DIAGNOSIS — E119 Type 2 diabetes mellitus without complications: Secondary | ICD-10-CM | POA: Diagnosis not present

## 2019-02-04 DIAGNOSIS — E11621 Type 2 diabetes mellitus with foot ulcer: Secondary | ICD-10-CM | POA: Diagnosis not present

## 2019-02-04 DIAGNOSIS — L97419 Non-pressure chronic ulcer of right heel and midfoot with unspecified severity: Secondary | ICD-10-CM | POA: Diagnosis not present

## 2019-02-04 DIAGNOSIS — L97418 Non-pressure chronic ulcer of right heel and midfoot with other specified severity: Secondary | ICD-10-CM | POA: Diagnosis not present

## 2019-02-04 DIAGNOSIS — F17200 Nicotine dependence, unspecified, uncomplicated: Secondary | ICD-10-CM | POA: Diagnosis not present

## 2019-02-04 DIAGNOSIS — R52 Pain, unspecified: Secondary | ICD-10-CM | POA: Diagnosis not present

## 2019-02-04 DIAGNOSIS — M25571 Pain in right ankle and joints of right foot: Secondary | ICD-10-CM | POA: Diagnosis not present

## 2019-02-04 DIAGNOSIS — I10 Essential (primary) hypertension: Secondary | ICD-10-CM | POA: Diagnosis not present

## 2019-02-04 DIAGNOSIS — Z888 Allergy status to other drugs, medicaments and biological substances status: Secondary | ICD-10-CM | POA: Diagnosis not present

## 2019-02-04 DIAGNOSIS — J449 Chronic obstructive pulmonary disease, unspecified: Secondary | ICD-10-CM | POA: Diagnosis not present

## 2019-02-04 DIAGNOSIS — F419 Anxiety disorder, unspecified: Secondary | ICD-10-CM | POA: Diagnosis not present

## 2019-02-09 DIAGNOSIS — E161 Other hypoglycemia: Secondary | ICD-10-CM | POA: Diagnosis not present

## 2019-02-09 DIAGNOSIS — R61 Generalized hyperhidrosis: Secondary | ICD-10-CM | POA: Diagnosis not present

## 2019-02-09 DIAGNOSIS — E162 Hypoglycemia, unspecified: Secondary | ICD-10-CM | POA: Diagnosis not present

## 2019-02-18 DIAGNOSIS — R457 State of emotional shock and stress, unspecified: Secondary | ICD-10-CM | POA: Diagnosis not present

## 2019-02-18 DIAGNOSIS — I10 Essential (primary) hypertension: Secondary | ICD-10-CM | POA: Diagnosis not present

## 2019-02-18 DIAGNOSIS — J449 Chronic obstructive pulmonary disease, unspecified: Secondary | ICD-10-CM | POA: Diagnosis not present

## 2019-02-18 DIAGNOSIS — F17200 Nicotine dependence, unspecified, uncomplicated: Secondary | ICD-10-CM | POA: Diagnosis not present

## 2019-02-18 DIAGNOSIS — R000 Tachycardia, unspecified: Secondary | ICD-10-CM | POA: Diagnosis not present

## 2019-02-18 DIAGNOSIS — R079 Chest pain, unspecified: Secondary | ICD-10-CM | POA: Diagnosis not present

## 2019-02-18 DIAGNOSIS — Z888 Allergy status to other drugs, medicaments and biological substances status: Secondary | ICD-10-CM | POA: Diagnosis not present

## 2019-02-18 DIAGNOSIS — E119 Type 2 diabetes mellitus without complications: Secondary | ICD-10-CM | POA: Diagnosis not present

## 2019-02-18 DIAGNOSIS — Z5321 Procedure and treatment not carried out due to patient leaving prior to being seen by health care provider: Secondary | ICD-10-CM | POA: Diagnosis not present

## 2019-03-02 DIAGNOSIS — R069 Unspecified abnormalities of breathing: Secondary | ICD-10-CM | POA: Diagnosis not present

## 2019-03-02 DIAGNOSIS — F419 Anxiety disorder, unspecified: Secondary | ICD-10-CM | POA: Diagnosis not present

## 2019-03-02 DIAGNOSIS — G629 Polyneuropathy, unspecified: Secondary | ICD-10-CM | POA: Diagnosis not present

## 2019-03-02 DIAGNOSIS — R0981 Nasal congestion: Secondary | ICD-10-CM | POA: Diagnosis not present

## 2019-03-02 DIAGNOSIS — F17200 Nicotine dependence, unspecified, uncomplicated: Secondary | ICD-10-CM | POA: Diagnosis not present

## 2019-03-02 DIAGNOSIS — Z79899 Other long term (current) drug therapy: Secondary | ICD-10-CM | POA: Diagnosis not present

## 2019-03-02 DIAGNOSIS — R0602 Shortness of breath: Secondary | ICD-10-CM | POA: Diagnosis not present

## 2019-03-02 DIAGNOSIS — J449 Chronic obstructive pulmonary disease, unspecified: Secondary | ICD-10-CM | POA: Diagnosis not present

## 2019-03-02 DIAGNOSIS — F329 Major depressive disorder, single episode, unspecified: Secondary | ICD-10-CM | POA: Diagnosis not present

## 2019-03-02 DIAGNOSIS — I10 Essential (primary) hypertension: Secondary | ICD-10-CM | POA: Diagnosis not present

## 2019-03-02 DIAGNOSIS — G602 Neuropathy in association with hereditary ataxia: Secondary | ICD-10-CM | POA: Diagnosis not present

## 2019-03-02 DIAGNOSIS — E119 Type 2 diabetes mellitus without complications: Secondary | ICD-10-CM | POA: Diagnosis not present

## 2019-03-02 DIAGNOSIS — R0600 Dyspnea, unspecified: Secondary | ICD-10-CM | POA: Diagnosis not present

## 2019-03-02 DIAGNOSIS — R000 Tachycardia, unspecified: Secondary | ICD-10-CM | POA: Diagnosis not present

## 2019-03-16 DIAGNOSIS — F17200 Nicotine dependence, unspecified, uncomplicated: Secondary | ICD-10-CM | POA: Diagnosis not present

## 2019-03-16 DIAGNOSIS — K219 Gastro-esophageal reflux disease without esophagitis: Secondary | ICD-10-CM | POA: Diagnosis not present

## 2019-03-16 DIAGNOSIS — I10 Essential (primary) hypertension: Secondary | ICD-10-CM | POA: Diagnosis not present

## 2019-03-16 DIAGNOSIS — J449 Chronic obstructive pulmonary disease, unspecified: Secondary | ICD-10-CM | POA: Diagnosis not present

## 2019-03-16 DIAGNOSIS — F418 Other specified anxiety disorders: Secondary | ICD-10-CM | POA: Diagnosis not present

## 2019-03-16 DIAGNOSIS — E119 Type 2 diabetes mellitus without complications: Secondary | ICD-10-CM | POA: Diagnosis not present

## 2019-03-16 DIAGNOSIS — R1010 Upper abdominal pain, unspecified: Secondary | ICD-10-CM | POA: Diagnosis not present

## 2019-03-16 DIAGNOSIS — R112 Nausea with vomiting, unspecified: Secondary | ICD-10-CM | POA: Diagnosis not present

## 2019-03-16 DIAGNOSIS — R52 Pain, unspecified: Secondary | ICD-10-CM | POA: Diagnosis not present

## 2019-03-16 DIAGNOSIS — R1084 Generalized abdominal pain: Secondary | ICD-10-CM | POA: Diagnosis not present

## 2019-03-16 DIAGNOSIS — Z79899 Other long term (current) drug therapy: Secondary | ICD-10-CM | POA: Diagnosis not present

## 2019-03-16 DIAGNOSIS — R1013 Epigastric pain: Secondary | ICD-10-CM | POA: Diagnosis not present

## 2019-03-19 DIAGNOSIS — Z111 Encounter for screening for respiratory tuberculosis: Secondary | ICD-10-CM | POA: Diagnosis not present

## 2019-03-27 DIAGNOSIS — I517 Cardiomegaly: Secondary | ICD-10-CM | POA: Diagnosis not present

## 2019-03-27 DIAGNOSIS — R918 Other nonspecific abnormal finding of lung field: Secondary | ICD-10-CM | POA: Diagnosis not present

## 2019-04-05 DIAGNOSIS — E1165 Type 2 diabetes mellitus with hyperglycemia: Secondary | ICD-10-CM | POA: Diagnosis not present

## 2019-04-05 DIAGNOSIS — R45851 Suicidal ideations: Secondary | ICD-10-CM | POA: Diagnosis not present

## 2019-04-05 DIAGNOSIS — E871 Hypo-osmolality and hyponatremia: Secondary | ICD-10-CM | POA: Diagnosis not present

## 2019-04-05 DIAGNOSIS — R443 Hallucinations, unspecified: Secondary | ICD-10-CM | POA: Diagnosis not present

## 2019-04-08 DIAGNOSIS — F319 Bipolar disorder, unspecified: Secondary | ICD-10-CM | POA: Diagnosis not present

## 2019-04-09 DIAGNOSIS — N189 Chronic kidney disease, unspecified: Secondary | ICD-10-CM | POA: Diagnosis not present

## 2019-04-09 DIAGNOSIS — E871 Hypo-osmolality and hyponatremia: Secondary | ICD-10-CM | POA: Diagnosis not present

## 2019-04-09 DIAGNOSIS — F319 Bipolar disorder, unspecified: Secondary | ICD-10-CM | POA: Diagnosis not present

## 2019-04-09 DIAGNOSIS — J441 Chronic obstructive pulmonary disease with (acute) exacerbation: Secondary | ICD-10-CM | POA: Diagnosis not present

## 2019-04-09 DIAGNOSIS — I10 Essential (primary) hypertension: Secondary | ICD-10-CM | POA: Diagnosis not present

## 2019-04-09 DIAGNOSIS — E1121 Type 2 diabetes mellitus with diabetic nephropathy: Secondary | ICD-10-CM | POA: Diagnosis not present

## 2019-04-10 DIAGNOSIS — R234 Changes in skin texture: Secondary | ICD-10-CM | POA: Diagnosis not present

## 2019-04-10 DIAGNOSIS — F319 Bipolar disorder, unspecified: Secondary | ICD-10-CM | POA: Diagnosis not present

## 2019-04-11 DIAGNOSIS — E1165 Type 2 diabetes mellitus with hyperglycemia: Secondary | ICD-10-CM | POA: Diagnosis not present

## 2019-04-11 DIAGNOSIS — F319 Bipolar disorder, unspecified: Secondary | ICD-10-CM | POA: Diagnosis not present

## 2019-04-11 DIAGNOSIS — J441 Chronic obstructive pulmonary disease with (acute) exacerbation: Secondary | ICD-10-CM | POA: Diagnosis not present

## 2019-04-12 DIAGNOSIS — L02828 Furuncle of other sites: Secondary | ICD-10-CM | POA: Diagnosis not present

## 2019-04-12 DIAGNOSIS — R3911 Hesitancy of micturition: Secondary | ICD-10-CM | POA: Diagnosis not present

## 2019-04-12 DIAGNOSIS — F319 Bipolar disorder, unspecified: Secondary | ICD-10-CM | POA: Diagnosis not present

## 2019-04-12 DIAGNOSIS — B372 Candidiasis of skin and nail: Secondary | ICD-10-CM | POA: Diagnosis not present

## 2019-04-13 DIAGNOSIS — F319 Bipolar disorder, unspecified: Secondary | ICD-10-CM | POA: Diagnosis not present

## 2019-04-14 DIAGNOSIS — Z590 Homelessness: Secondary | ICD-10-CM | POA: Diagnosis not present

## 2019-04-14 DIAGNOSIS — Z7984 Long term (current) use of oral hypoglycemic drugs: Secondary | ICD-10-CM | POA: Diagnosis not present

## 2019-04-14 DIAGNOSIS — R3911 Hesitancy of micturition: Secondary | ICD-10-CM | POA: Diagnosis not present

## 2019-04-14 DIAGNOSIS — Z7982 Long term (current) use of aspirin: Secondary | ICD-10-CM | POA: Diagnosis not present

## 2019-04-14 DIAGNOSIS — Z720 Tobacco use: Secondary | ICD-10-CM | POA: Diagnosis not present

## 2019-04-14 DIAGNOSIS — R45851 Suicidal ideations: Secondary | ICD-10-CM | POA: Diagnosis not present

## 2019-04-14 DIAGNOSIS — Z1159 Encounter for screening for other viral diseases: Secondary | ICD-10-CM | POA: Diagnosis not present

## 2019-04-14 DIAGNOSIS — F259 Schizoaffective disorder, unspecified: Secondary | ICD-10-CM | POA: Diagnosis not present

## 2019-04-14 DIAGNOSIS — E119 Type 2 diabetes mellitus without complications: Secondary | ICD-10-CM | POA: Diagnosis not present

## 2019-04-14 DIAGNOSIS — R339 Retention of urine, unspecified: Secondary | ICD-10-CM | POA: Diagnosis not present

## 2019-04-14 DIAGNOSIS — J449 Chronic obstructive pulmonary disease, unspecified: Secondary | ICD-10-CM | POA: Diagnosis not present

## 2019-04-14 DIAGNOSIS — R45850 Homicidal ideations: Secondary | ICD-10-CM | POA: Diagnosis not present

## 2019-04-14 DIAGNOSIS — R52 Pain, unspecified: Secondary | ICD-10-CM | POA: Diagnosis not present

## 2019-04-14 DIAGNOSIS — Z79899 Other long term (current) drug therapy: Secondary | ICD-10-CM | POA: Diagnosis not present

## 2019-04-14 DIAGNOSIS — I959 Hypotension, unspecified: Secondary | ICD-10-CM | POA: Diagnosis not present

## 2019-04-14 DIAGNOSIS — R457 State of emotional shock and stress, unspecified: Secondary | ICD-10-CM | POA: Diagnosis not present

## 2019-04-15 DIAGNOSIS — F29 Unspecified psychosis not due to a substance or known physiological condition: Secondary | ICD-10-CM | POA: Diagnosis not present

## 2019-04-15 DIAGNOSIS — Z7984 Long term (current) use of oral hypoglycemic drugs: Secondary | ICD-10-CM | POA: Diagnosis not present

## 2019-04-15 DIAGNOSIS — R52 Pain, unspecified: Secondary | ICD-10-CM | POA: Diagnosis not present

## 2019-04-15 DIAGNOSIS — Z79899 Other long term (current) drug therapy: Secondary | ICD-10-CM | POA: Diagnosis not present

## 2019-04-15 DIAGNOSIS — Z590 Homelessness: Secondary | ICD-10-CM | POA: Diagnosis not present

## 2019-04-15 DIAGNOSIS — R45850 Homicidal ideations: Secondary | ICD-10-CM | POA: Diagnosis not present

## 2019-04-15 DIAGNOSIS — R339 Retention of urine, unspecified: Secondary | ICD-10-CM | POA: Diagnosis not present

## 2019-04-15 DIAGNOSIS — Z7982 Long term (current) use of aspirin: Secondary | ICD-10-CM | POA: Diagnosis not present

## 2019-04-15 DIAGNOSIS — E1165 Type 2 diabetes mellitus with hyperglycemia: Secondary | ICD-10-CM | POA: Diagnosis not present

## 2019-04-15 DIAGNOSIS — F259 Schizoaffective disorder, unspecified: Secondary | ICD-10-CM | POA: Diagnosis not present

## 2019-04-15 DIAGNOSIS — R45851 Suicidal ideations: Secondary | ICD-10-CM | POA: Diagnosis not present

## 2019-04-15 DIAGNOSIS — Z5181 Encounter for therapeutic drug level monitoring: Secondary | ICD-10-CM | POA: Diagnosis not present

## 2019-04-15 DIAGNOSIS — Z1159 Encounter for screening for other viral diseases: Secondary | ICD-10-CM | POA: Diagnosis not present

## 2019-04-16 DIAGNOSIS — R45851 Suicidal ideations: Secondary | ICD-10-CM | POA: Diagnosis not present

## 2019-04-25 DIAGNOSIS — M79604 Pain in right leg: Secondary | ICD-10-CM | POA: Diagnosis not present

## 2019-04-25 DIAGNOSIS — M79605 Pain in left leg: Secondary | ICD-10-CM | POA: Diagnosis not present

## 2019-04-25 DIAGNOSIS — Z209 Contact with and (suspected) exposure to unspecified communicable disease: Secondary | ICD-10-CM | POA: Diagnosis not present

## 2019-04-25 DIAGNOSIS — J449 Chronic obstructive pulmonary disease, unspecified: Secondary | ICD-10-CM | POA: Diagnosis not present

## 2019-04-25 DIAGNOSIS — G8929 Other chronic pain: Secondary | ICD-10-CM | POA: Diagnosis not present

## 2019-04-25 DIAGNOSIS — F29 Unspecified psychosis not due to a substance or known physiological condition: Secondary | ICD-10-CM | POA: Diagnosis not present

## 2019-04-25 DIAGNOSIS — F329 Major depressive disorder, single episode, unspecified: Secondary | ICD-10-CM | POA: Diagnosis not present

## 2019-04-25 DIAGNOSIS — F419 Anxiety disorder, unspecified: Secondary | ICD-10-CM | POA: Diagnosis not present

## 2019-04-25 DIAGNOSIS — F17200 Nicotine dependence, unspecified, uncomplicated: Secondary | ICD-10-CM | POA: Diagnosis not present

## 2019-04-25 DIAGNOSIS — E119 Type 2 diabetes mellitus without complications: Secondary | ICD-10-CM | POA: Diagnosis not present

## 2019-04-25 DIAGNOSIS — M25552 Pain in left hip: Secondary | ICD-10-CM | POA: Diagnosis not present

## 2019-04-25 DIAGNOSIS — Z79899 Other long term (current) drug therapy: Secondary | ICD-10-CM | POA: Diagnosis not present

## 2019-04-25 DIAGNOSIS — I10 Essential (primary) hypertension: Secondary | ICD-10-CM | POA: Diagnosis not present

## 2019-04-25 DIAGNOSIS — M25551 Pain in right hip: Secondary | ICD-10-CM | POA: Diagnosis not present

## 2019-05-02 ENCOUNTER — Emergency Department (HOSPITAL_COMMUNITY): Payer: Medicare Other

## 2019-05-02 ENCOUNTER — Encounter (HOSPITAL_COMMUNITY): Payer: Self-pay

## 2019-05-02 ENCOUNTER — Emergency Department (HOSPITAL_COMMUNITY)
Admission: EM | Admit: 2019-05-02 | Discharge: 2019-05-06 | Disposition: A | Discharge status: Home / Self Care | Payer: Medicare Other | Attending: Emergency Medicine | Admitting: Emergency Medicine

## 2019-05-02 DIAGNOSIS — R52 Pain, unspecified: Secondary | ICD-10-CM | POA: Diagnosis not present

## 2019-05-02 DIAGNOSIS — Z03818 Encounter for observation for suspected exposure to other biological agents ruled out: Secondary | ICD-10-CM | POA: Diagnosis not present

## 2019-05-02 DIAGNOSIS — Z046 Encounter for general psychiatric examination, requested by authority: Secondary | ICD-10-CM

## 2019-05-02 DIAGNOSIS — R259 Unspecified abnormal involuntary movements: Secondary | ICD-10-CM | POA: Diagnosis not present

## 2019-05-02 DIAGNOSIS — F259 Schizoaffective disorder, unspecified: Secondary | ICD-10-CM | POA: Diagnosis not present

## 2019-05-02 DIAGNOSIS — F17210 Nicotine dependence, cigarettes, uncomplicated: Secondary | ICD-10-CM | POA: Insufficient documentation

## 2019-05-02 DIAGNOSIS — Z79899 Other long term (current) drug therapy: Secondary | ICD-10-CM | POA: Diagnosis not present

## 2019-05-02 DIAGNOSIS — J449 Chronic obstructive pulmonary disease, unspecified: Secondary | ICD-10-CM | POA: Insufficient documentation

## 2019-05-02 DIAGNOSIS — Z794 Long term (current) use of insulin: Secondary | ICD-10-CM | POA: Insufficient documentation

## 2019-05-02 DIAGNOSIS — Z7982 Long term (current) use of aspirin: Secondary | ICD-10-CM | POA: Insufficient documentation

## 2019-05-02 DIAGNOSIS — F329 Major depressive disorder, single episode, unspecified: Secondary | ICD-10-CM | POA: Diagnosis not present

## 2019-05-02 DIAGNOSIS — R079 Chest pain, unspecified: Secondary | ICD-10-CM | POA: Diagnosis not present

## 2019-05-02 DIAGNOSIS — R45850 Homicidal ideations: Secondary | ICD-10-CM | POA: Diagnosis not present

## 2019-05-02 DIAGNOSIS — R45851 Suicidal ideations: Secondary | ICD-10-CM | POA: Insufficient documentation

## 2019-05-02 DIAGNOSIS — E1140 Type 2 diabetes mellitus with diabetic neuropathy, unspecified: Secondary | ICD-10-CM | POA: Diagnosis not present

## 2019-05-02 DIAGNOSIS — K5900 Constipation, unspecified: Secondary | ICD-10-CM | POA: Diagnosis not present

## 2019-05-02 DIAGNOSIS — Z9119 Patient's noncompliance with other medical treatment and regimen: Secondary | ICD-10-CM | POA: Insufficient documentation

## 2019-05-02 LAB — ACETAMINOPHEN LEVEL: Acetaminophen (Tylenol), Serum: <10 ug/mL — ABNORMAL LOW (ref 10–30)

## 2019-05-02 LAB — COMPREHENSIVE METABOLIC PANEL
ALT: 15 U/L (ref 0–44)
AST: 12 U/L — ABNORMAL LOW (ref 15–41)
Albumin: 4 g/dL (ref 3.5–5.0)
Alkaline Phosphatase: 65 U/L (ref 38–126)
Anion gap: 9 (ref 5–15)
BUN: 12 mg/dL (ref 6–20)
CO2: 21 mmol/L — ABNORMAL LOW (ref 22–32)
Calcium: 8.8 mg/dL — ABNORMAL LOW (ref 8.9–10.3)
Chloride: 105 mmol/L (ref 98–111)
Creatinine, Ser: 1.26 mg/dL — ABNORMAL HIGH (ref 0.61–1.24)
GFR calc Af Amer: >60 mL/min (ref >60)
GFR calc non Af Amer: >60 mL/min (ref >60)
Glucose, Bld: 182 mg/dL — ABNORMAL HIGH (ref 70–99)
Potassium: 3.7 mmol/L (ref 3.5–5.1)
Sodium: 135 mmol/L (ref 135–145)
Total Bilirubin: 0.6 mg/dL (ref 0.3–1.2)
Total Protein: 6.9 g/dL (ref 6.5–8.1)

## 2019-05-02 LAB — ETHANOL: Alcohol, Ethyl (B): <10 mg/dL (ref <10)

## 2019-05-02 LAB — RAPID URINE DRUG SCREEN, HOSP PERFORMED
Amphetamines: NOT DETECTED
Barbiturates: NOT DETECTED
Benzodiazepines: NOT DETECTED
Cocaine: NOT DETECTED
Opiates: NOT DETECTED
Tetrahydrocannabinol: NOT DETECTED

## 2019-05-02 LAB — CBC
HCT: 40.4 % (ref 39.0–52.0)
Hemoglobin: 13.5 g/dL (ref 13.0–17.0)
MCH: 29.8 pg (ref 26.0–34.0)
MCHC: 33.4 g/dL (ref 30.0–36.0)
MCV: 89.2 fL (ref 80.0–100.0)
Platelets: 193 10*3/uL (ref 150–400)
RBC: 4.53 MIL/uL (ref 4.22–5.81)
RDW: 13.9 % (ref 11.5–15.5)
WBC: 5 10*3/uL (ref 4.0–10.5)
nRBC: 0 % (ref 0.0–0.2)

## 2019-05-02 LAB — SARS CORONAVIRUS 2 BY RT PCR (HOSPITAL ORDER, PERFORMED IN ~~LOC~~ HOSPITAL LAB): SARS Coronavirus 2: NEGATIVE

## 2019-05-02 LAB — SALICYLATE LEVEL: Salicylate Lvl: <7 mg/dL (ref 2.8–30.0)

## 2019-05-02 MED ORDER — ACETAMINOPHEN 325 MG PO TABS
650.0000 mg | ORAL_TABLET | ORAL | Status: DC | PRN
  Administered 2019-05-03 – 2019-05-05 (×6): 650 mg via ORAL
  Filled 2019-05-02 (×7): qty 2

## 2019-05-02 MED ORDER — POLYETHYLENE GLYCOL 3350 17 G PO PACK
17.0000 g | PACK | Freq: Once | ORAL | Status: AC
  Administered 2019-05-02: 17 g via ORAL
  Filled 2019-05-02: qty 1

## 2019-05-02 MED ORDER — NICOTINE 21 MG/24HR TD PT24
21.0000 mg | MEDICATED_PATCH | Freq: Every day | TRANSDERMAL | Status: DC
  Administered 2019-05-02 – 2019-05-06 (×5): 21 mg via TRANSDERMAL
  Filled 2019-05-02 (×5): qty 1

## 2019-05-02 MED ORDER — ONDANSETRON HCL 4 MG PO TABS: 4.0000 mg | ORAL_TABLET | Freq: Three times a day (TID) | ORAL | Status: DC | PRN

## 2019-05-02 MED ORDER — IBUPROFEN 800 MG PO TABS
800.0000 mg | ORAL_TABLET | Freq: Four times a day (QID) | ORAL | Status: DC | PRN
  Administered 2019-05-04 – 2019-05-05 (×2): 800 mg via ORAL
  Filled 2019-05-02 (×3): qty 1

## 2019-05-02 MED ORDER — ALBUTEROL SULFATE HFA 108 (90 BASE) MCG/ACT IN AERS: 2.0000 | INHALATION_SPRAY | Freq: Four times a day (QID) | RESPIRATORY_TRACT | Status: DC | PRN

## 2019-05-02 MED ORDER — BACLOFEN 10 MG PO TABS
10.0000 mg | ORAL_TABLET | Freq: Three times a day (TID) | ORAL | Status: DC
  Administered 2019-05-02 – 2019-05-06 (×11): 10 mg via ORAL
  Filled 2019-05-02 (×11): qty 1

## 2019-05-02 MED ORDER — LISINOPRIL 10 MG PO TABS
10.0000 mg | ORAL_TABLET | Freq: Every day | ORAL | Status: DC
  Administered 2019-05-04 – 2019-05-06 (×3): 10 mg via ORAL
  Filled 2019-05-02 (×4): qty 1

## 2019-05-02 MED ORDER — METOPROLOL SUCCINATE ER 25 MG PO TB24
50.0000 mg | ORAL_TABLET | Freq: Every day | ORAL | Status: DC
  Administered 2019-05-03 – 2019-05-06 (×4): 50 mg via ORAL
  Filled 2019-05-02 (×5): qty 2

## 2019-05-02 MED ORDER — INSULIN ASPART 100 UNIT/ML ~~LOC~~ SOLN: 1.0000 [IU] | Freq: Three times a day (TID) | SUBCUTANEOUS | Status: DC

## 2019-05-02 MED ORDER — GABAPENTIN 400 MG PO CAPS
800.0000 mg | ORAL_CAPSULE | Freq: Four times a day (QID) | ORAL | Status: DC
  Administered 2019-05-02 – 2019-05-06 (×14): 800 mg via ORAL
  Filled 2019-05-02 (×13): qty 2

## 2019-05-02 MED ORDER — ASPIRIN EC 81 MG PO TBEC
81.0000 mg | DELAYED_RELEASE_TABLET | Freq: Every day | ORAL | Status: DC
  Administered 2019-05-04 – 2019-05-06 (×3): 81 mg via ORAL
  Filled 2019-05-02 (×4): qty 1

## 2019-05-02 MED ORDER — ALUM & MAG HYDROXIDE-SIMETH 200-200-20 MG/5ML PO SUSP: 30.0000 mL | Freq: Four times a day (QID) | ORAL | Status: DC | PRN

## 2019-05-02 MED ORDER — HYDROXYZINE HCL 25 MG PO TABS
25.0000 mg | ORAL_TABLET | Freq: Once | ORAL | Status: AC
  Administered 2019-05-02: 25 mg via ORAL
  Filled 2019-05-02 (×2): qty 1

## 2019-05-02 MED ORDER — METFORMIN HCL ER 500 MG PO TB24
1000.0000 mg | ORAL_TABLET | Freq: Two times a day (BID) | ORAL | Status: DC
  Administered 2019-05-04 – 2019-05-06 (×4): 1000 mg via ORAL
  Filled 2019-05-02 (×9): qty 2

## 2019-05-02 MED ORDER — HYDROXYZINE HCL 10 MG PO TABS
10.0000 mg | ORAL_TABLET | Freq: Three times a day (TID) | ORAL | Status: DC
  Administered 2019-05-03 – 2019-05-06 (×10): 10 mg via ORAL
  Filled 2019-05-02 (×14): qty 1

## 2019-05-02 MED ORDER — PANTOPRAZOLE SODIUM 40 MG PO TBEC
40.0000 mg | DELAYED_RELEASE_TABLET | Freq: Every day | ORAL | Status: DC
  Administered 2019-05-02 – 2019-05-06 (×5): 40 mg via ORAL
  Filled 2019-05-02 (×5): qty 1

## 2019-05-02 NOTE — ED Provider Notes (Signed)
[facility] EMERGENCY DEPARTMENT Provider Note   CSN: [id number] Arrival date & time: 05/02/19  1339  History   Chief Complaint Chief Complaint  Patient presents with  . Suicidal  . Numbness  . Constipation   HPI Keyion L Delman is a 58 y.o. male past medical history significant for alcohol abuse, benzodiazepine abuse, chronic back pain, polysubstance abuse, COPD, diabetes, hypertension, MI, schizoaffective disorder who presents for evaluation of suicidal ideation. Admits to SI with plan to hang himself or walk into traffic.  Admits to HI states he will strangle people on the street if they look at him the wrong way.  Has been seen multiple times for polysubstance abuse.  Patient requesting "Roxi and Klonopin." Has had multiple inpatient hospitalizations for SI. History of schizoaffective disorder however is not taking any medications for this.  He denies AVH.  Also admits to constipation.  Has not had a bowel movement over the last 2 days.  Denies melena or hematochezia.  Denies abdominal pain, fever, chills, nausea, vomiting, chest pain, shortness of breath, flank pain, decreased range of motion to his extremities, numbness or tingling, redness, swelling or warmth.  Denies additional aggravating or alleviating factors.  Has not taken anything for his constipation.  No Abdominal pain.  Patient also states he has had numbness to the distal portion of his left pinky times years.  He has no other episodes of numbness.  Constant in nature.  States he has been seen by his PCP for this.  No facial asymmetry, unilateral weakness, dysphasia.  History obtained from patient and past medical records.  No interpreter is used.     HPI  Past Medical History:  Diagnosis Date  . Alcohol dependence ([place]) 1979   stated abusing ETOH at age 35   . Back pain   . Benzodiazepine dependence ([place])   . Cardiac arrest ([place])   . Chronic back pain   . Cocaine abuse ([place])   . COPD (chronic obstructive pulmonary  disease) ([place])   . Diabetes mellitus without complication ([place]) 1962  . Hypertension 2008  . MI (myocardial infarction) ([place])   . Non-compliance   . Schizoaffective disorder ([place]) 2006   . Substance abuse [facility])     Patient Active Problem List   Diagnosis Date Noted  . Overdose 04/14/2018  . MDD (major depressive disorder) 11/12/2017  . 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 Diabetic neuropathy ([place]) 11/11/2016  . Anxiety and depression 11/11/2016  . Chronic insomnia 01/08/2016  . Alcohol use disorder 12/10/2015  . Benzodiazepine abuse ([place]) 12/10/2015  . Opioid abuse ([place]) 12/10/2015  . Constipation 02/06/2015  . BPH (benign prostatic hyperplasia) 01/16/2015  . Vitamin D deficiency 01/16/2015  . Allergic rhinitis 11/19/2014  . Chronic low back pain 11/07/2014  . Onychomycosis of toenail 11/07/2014  . Tinea pedis 11/07/2014  . Psoriasis 10/14/2014  . HTN (hypertension) 10/14/2014  . Diabetes mellitus type 2, controlled ([place]) 10/14/2014  . COPD (chronic obstructive pulmonary disease) ([place]) 10/14/2014  . Tobacco abuse 10/14/2014  . HLD (hyperlipidemia) 10/14/2014  . Schizoaffective disorder, depressive type ([place]) 10/14/2014    Past Surgical History:  Procedure Laterality Date  . CYST EXCISION          Home Medications    Prior to Admission medications   Medication Sig Start Date End Date Taking? Authorizing Provider  albuterol (PROVENTIL HFA;VENTOLIN HFA) 108 (90 Base) MCG/ACT inhaler Inhale 2 puffs into the lungs every 6 (six) hours as needed  for wheezing or shortness of breath. 11/15/17  Yes Lindell Spar I, NP  aspirin EC 81 MG tablet Take 81 mg by mouth daily.   Yes [provider]  atorvastatin (LIPITOR) 40 MG tablet TAKE 1 TABLET BY MOUTH EVERY DAY: For high Cholesterol Patient taking differently: Take 40 mg by mouth daily at 6 PM.  11/15/17  Yes Nwoko, Herbert Pun I, NP  baclofen (LIORESAL) 10 MG tablet Take 10  mg by mouth 3 (three) times daily.   Yes [provider]  budesonide-formoterol (SYMBICORT) 80-4.5 MCG/ACT inhaler Inhale 2 puffs into the lungs 2 (two) times daily.   Yes [provider]  gabapentin (NEURONTIN) 800 MG tablet Take 800 mg by mouth 4 (four) times daily. 12/08/17  Yes [provider]  hydrOXYzine (ATARAX/VISTARIL) 10 MG tablet Take 10 mg by mouth 3 (three) times daily.   Yes [provider]  ibuprofen (ADVIL,MOTRIN) 800 MG tablet Take 1 tablet (800 mg total) by mouth every 6 (six) hours as needed (moderate to severe pain). 11/15/17  Yes Lindell Spar I, NP  insulin aspart (NOVOLOG) 100 UNIT/ML injection Inject 1-30 Units into the skin 3 (three) times daily with meals. SLIDING SCALE 02/12/19  Yes [provider]  isosorbide mononitrate (IMDUR) 30 MG 24 hr tablet Take 30 mg by mouth daily.   Yes [provider]  lisinopril (PRINIVIL,ZESTRIL) 10 MG tablet Take 1 tablet by mouth daily. 12/12/17  Yes [provider]  metFORMIN (GLUCOPHAGE-XR) 500 MG 24 hr tablet Take 1 tablet (500 mg total) by mouth 2 (two) times daily with a meal. For diabetes management Patient taking differently: Take 1,000 mg by mouth 2 (two) times daily with a meal. For diabetes management 11/15/17  Yes Lindell Spar I, NP  metoprolol succinate (TOPROL-XL) 25 MG 24 hr tablet Take 50 mg by mouth daily.   Yes [provider]  nitroGLYCERIN (NITROSTAT) 0.4 MG SL tablet Place 0.4 mg under the tongue every 5 (five) minutes as needed for chest pain.   Yes [provider]  omeprazole (PRILOSEC) 20 MG capsule Take 20 mg by mouth daily.   Yes [provider]  paliperidone (INVEGA SUSTENNA) 234 MG/1.5ML SUSP injection Inject 234 mg into the muscle every 30 (thirty) days. (Due on 11-14-17): For mood control 11/15/17  Yes Nwoko, Herbert Pun I, NP  LEVEMIR 100 UNIT/ML injection Inject into the skin at bedtime.  04/26/19   [provider]    Family  History Family History  Problem Relation Age of Onset  . Heart disease Mother   . Heart disease Father   . Heart disease Maternal Grandmother   . Diabetes Maternal Grandmother     Social History Social History   Tobacco Use  . Smoking status: Current Every Day Smoker    Packs/day: 1.50    Types: Cigarettes  . Smokeless tobacco: Never Used  Substance Use Topics  . Alcohol use: Yes    Alcohol/week: 0.0 standard drinks  . Drug use: Not Currently    Types: Cocaine     Allergies   Quetiapine, Seroquel [quetiapine fumarate], Trazodone, Trazodone and nefazodone, and Haldol [haloperidol lactate]   Review of Systems Review of Systems  Constitutional: Negative.   HENT: Negative.   Eyes: Negative.   Respiratory: Negative.   Cardiovascular: Negative.   Gastrointestinal: Positive for constipation. Negative for abdominal distention, abdominal pain, anal bleeding, blood in stool, diarrhea, nausea, rectal pain and vomiting.  Genitourinary: Negative.   Musculoskeletal: Negative.   Skin: Negative.  Neurological: Negative.   Psychiatric/Behavioral: Positive for suicidal ideas.  All other systems reviewed and are negative.    Physical Exam Updated Vital Signs BP 136/76 (BP Location: Right Arm)   Pulse 89   Temp 98.1 F (36.7 C) (Temporal)   Resp 18   Ht 6' (1.829 m)   Wt 108.9 kg   SpO2 98%   BMI 32.55 kg/m   Physical Exam Vitals signs and nursing note reviewed.  Constitutional:      General: He is not in acute distress.    Appearance: He is not ill-appearing, toxic-appearing or diaphoretic.  HENT:     Head: Normocephalic and atraumatic.     Jaw: There is normal jaw occlusion.     Ears:     Comments:      Nose:     Comments: Clear rhinorrhea and congestion to bilateral nares.  No sinus tenderness.    Mouth/Throat:     Comments: Posterior oropharynx clear.  Mucous membranes moist.  Tonsils without erythema or exudate.  Uvula midline without deviation.  No evidence  of PTA or RPA.  No drooling, dysphasia or trismus.  Phonation normal. Neck:     Trachea: Trachea and phonation normal.     Meningeal: Brudzinski's sign and Kernig's sign absent.     Comments: No Neck stiffness or neck rigidity.  No meningismus.  No cervical lymphadenopathy. Cardiovascular:     Comments: No murmurs rubs or gallops. Pulmonary:     Comments: Clear to auscultation bilaterally without wheeze, rhonchi or rales.  No accessory muscle usage.  Able speak in full sentences. Abdominal:     General: Bowel sounds are normal.     Palpations: Abdomen is soft.     Tenderness: There is no abdominal tenderness. There is no right CVA tenderness, left CVA tenderness, guarding or rebound.     Comments: Soft, nontender without rebound or guarding.  No CVA tenderness.  Diastases recti to abdominal wall.  Genitourinary:    Comments: Refused GU exam Musculoskeletal:     Comments: Moves all 4 extremities without difficulty.  Lower extremities without edema, erythema or warmth.  Skin:    Comments: Brisk capillary refill.  No rashes or lesions.  Neurological:     Mental Status: He is alert.     Comments: Mental Status:  Alert, oriented, thought content appropriate. Speech fluent without evidence of aphasia. Able to follow 2 step commands without difficulty.  Cranial Nerves:  II:  Peripheral visual fields grossly normal, pupils equal, round, reactive to light III,IV, VI: ptosis not present, extra-ocular motions intact bilaterally  V,VII: smile symmetric, facial light touch sensation equal VIII: hearing grossly normal bilaterally  IX,X: midline uvula rise  XI: bilateral shoulder shrug equal and strong XII: midline tongue extension  Motor:  5/5 in upper and lower extremities bilaterally including strong and equal grip strength and dorsiflexion/plantar flexion Sensory: Pinprick and light touch normal in all extremities.  Deep Tendon Reflexes: 2+ and symmetric  Cerebellar: normal finger-to-nose  with bilateral upper extremities Gait: normal gait and balance CV: distal pulses palpable throughout       .    ED Treatments / Results  Labs (all labs ordered are listed, but only abnormal results are displayed) Labs Reviewed  COMPREHENSIVE METABOLIC PANEL - Abnormal; Notable for the following components:      Result Value   CO2 21 (*)    Glucose, Bld 182 (*)    Creatinine, Ser 1.26 (*)    Calcium 8.8 (*)  AST 12 (*)    All other components within normal limits  ACETAMINOPHEN LEVEL - Abnormal; Notable for the following components:   Acetaminophen (Tylenol), Serum <10 (*)    All other components within normal limits  SARS CORONAVIRUS 2 (HOSPITAL ORDER, [place] [facility])  ETHANOL  SALICYLATE LEVEL  CBC  RAPID URINE DRUG SCREEN, HOSP PERFORMED    EKG None  Radiology Dg Abdomen 1 View  Result Date: 05/02/2019 CLINICAL DATA:  Right-sided abdominal pain.  Constipation. EXAM: ABDOMEN - 1 VIEW COMPARISON:  12/27/2017 FINDINGS: Moderate to marked increased stool burden throughout the colon. No bowel dilation to suggest obstruction. Iliac artery vascular calcifications. Several small densities project over the renal shadows which may be vascular, within bowel or intrarenal. Soft tissues are otherwise unremarkable. No acute or significant skeletal abnormality. IMPRESSION: 1. No acute findings. 2. Constipation with moderate to marked increased stool throughout the colon. 3. Probable bilateral intrarenal stones. Electronically Signed   By: Lajean Manes M.D.   On: 05/02/2019 16:14    Procedures Procedures (including critical care time)  Medications Ordered in ED Medications  acetaminophen (TYLENOL) tablet 650 mg (has no administration in time range)  ondansetron (ZOFRAN) tablet 4 mg (has no administration in time range)  nicotine (NICODERM CQ - dosed in mg/24 hours) patch 21 mg (21 mg Transdermal Patch Applied 05/02/19 1648)  alum & mag hydroxide-simeth  (MAALOX/MYLANTA) 200-200-20 MG/5ML suspension 30 mL (has no administration in time range)  hydrOXYzine (ATARAX/VISTARIL) tablet 25 mg (25 mg Oral Refused 05/02/19 1709)  polyethylene glycol (MIRALAX / GLYCOLAX) packet 17 g (17 g Oral Given 05/02/19 1707)     Initial Impression / Assessment and Plan / ED Course  I have reviewed the triage vital signs and the nursing notes.  Pertinent labs & imaging results that were available during my care of the patient were reviewed by me and considered in my medical decision making (see chart for details).  58 year old male with extensive past medical history presents for evaluation of suicide ideation and constipation.  Patient with plan to hang himself.  Also admits to HI with plan to strangle people on the streets.  Denies AVH.  Multiple hospitalizations in the past for suicidal ideation.  Has history of schizoaffective disorder, polysubstance abuse however has not taken his medicines.  Also admits to constipation over the last 2 days.  He declines GU exam.  Denies melena or hematochezia.  Abdomen soft, nontender without rebound or guarding.  No emesis.  He is tolerating p.o. intake without difficulty. Heart and lungs clear.  Plain film abdomen with stool throughout colon. No free air.  Will give patient MiraLAX.  Psych labs obtained.  Patient with subjective numbness to his distal left pinky X years.  He has a nonfocal neurologic exam without neurologic deficits.  No facial droop.  No evidence of CVA.  Given length of time patient follow-up for this with his PCP.  Do not think this is acute in nature.  CBC without leukocytosis Metabolic panel with mild hyperglycemia at 182, history of diabetes, creatinine 1.26, no additional electrolyte, renal or liver abnormality Ethanol, salicylate, acetaminophen negative UDS negative COVID negative  Patient has been medically cleared.  Psych hold orders have been placed. Patient is currently under involuntary commitment.   TTS pending.     Final Clinical Impressions(s) / ED Diagnoses   Final diagnoses:  Suicidal ideation  Involuntary commitment  Constipation, unspecified constipation type    ED Discharge Orders  None       Oreoluwa Gilmer A, PA-C 05/02/19 2034    Fredia Sorrow, MD 05/17/19 [phone]

## 2019-05-02 NOTE — ED Notes (Signed)
Pt wanded by security. 

## 2019-05-02 NOTE — BH Assessment (Signed)
Tele Assessment Note   Patient Name: [name] MRN: [id number] Referring Physician: Morton Amy, PA Location of Patient: [facility] Location of Provider: [place] is an 58 y.o. male presenting with SI with plan to hang himself and HI with plan to strangle anybody on the streets that looks at him the wrong way. Patient has history of schizoaffective and bipolar. Patient has multiple ED visits. Patient reported seeing Dr. Reece Levy 20 years ago. Patient reported "plenty" of past suicide attempts, patient refused to share details. Patient reported SI for 25 years, stating "I don't feel like talking about it". Patient denied self-harming behaviors. Patient reported seeing Dr. Reece Levy in the beginning of mental illness. Patient reported not receiving any outpatient therapy services. Patient denied   PER IVC States he will hang himself with rope. Admits to wanting to walk into traffic to harm himself. States he will strangle people walking on street if they look at him the wrong way.  UDS + cocaine BAL negative    Diagnosis: Major depressive disorder  Past Medical History:  Past Medical History:  Diagnosis Date  . Alcohol dependence ([place]) 1979   stated abusing ETOH at age 76   . Back pain   . Benzodiazepine dependence ([place])   . Cardiac arrest ([place])   . Chronic back pain   . Cocaine abuse ([place])   . COPD (chronic obstructive pulmonary disease) ([place])   . Diabetes mellitus without complication ([place]) 5456  . Hypertension 2008  . MI (myocardial infarction) ([place])   . Non-compliance   . Schizoaffective disorder ([place]) 2006   . Substance abuse [facility])     Past Surgical History:  Procedure Laterality Date  . CYST EXCISION      Family History:  Family History  Problem Relation Age of Onset  . Heart disease Mother   . Heart disease Father   . Heart disease Maternal Grandmother   . Diabetes Maternal Grandmother     Social History:  reports that he has  been smoking cigarettes. He has been smoking about 1.50 packs per day. He has never used smokeless tobacco. He reports current alcohol use. He reports previous drug use. Drug: Cocaine.  Additional Social History:  Alcohol / Drug Use Pain Medications: see MAR Prescriptions: see MAR Over the Counter: see MAR  CIWA: CIWA-Ar BP: (!) 132/45 Pulse Rate: 68 COWS:    Allergies:  Allergies  Allergen Reactions  . Quetiapine Anaphylaxis  . Seroquel [Quetiapine Fumarate] Anxiety and Other (See Comments)    Reaction:  Nightmares   . Trazodone Anaphylaxis  . Trazodone And Nefazodone Anxiety and Other (See Comments)    Other reaction(s): Unknown Nightmares Reaction:  Nightmares   . Haldol [Haloperidol Lactate] Anxiety and Other (See Comments)    Reaction:  Nightmares     Home Medications: (Not in a hospital admission)   OB/GYN Status:  No LMP for male patient.  General Assessment Data Location of Assessment: [facility] TTS Assessment: In system Is this a Tele or Face-to-Face Assessment?: Tele Assessment Is this an Initial Assessment or a Re-assessment for this encounter?: Initial Assessment Patient Accompanied by:: N/A Language Other than English: No Living Arrangements: Homeless/Shelter What gender do you identify as?: Male Marital status: Divorced Living Arrangements: (homeless) Can pt return to current living arrangement?: Yes Admission Status: Involuntary Referral Source: Self/Family/Friend     Crisis Care Plan Living Arrangements: (homeless) Legal Guardian: (self) Name of Psychiatrist: (none) Name of Therapist: (none)  Education Status  Is patient currently in school?: No Is the patient employed, unemployed or receiving disability?: Unemployed  Risk to self with the past 6 months Suicidal Ideation: Yes-Currently Present Has patient been a risk to self within the past 6 months prior to admission? : Yes Suicidal Intent: Yes-Currently Present Has patient had any suicidal  intent within the past 6 months prior to admission? : Yes Is patient at risk for suicide?: Yes Suicidal Plan?: Yes-Currently Present Has patient had any suicidal plan within the past 6 months prior to admission? : Yes Specify Current Suicidal Plan: (hang self) Access to Means: Yes Specify Access to Suicidal Means: (hang self with rope) What has been your use of drugs/alcohol within the last 12 months?: (none) Previous Attempts/Gestures: No How many times?: ("plenty times") Other Self Harm Risks: (denied) Triggers for Past Attempts: Unknown, Unpredictable Intentional Self Injurious Behavior: None Family Suicide History: No Recent stressful life event(s): (homeless and medical) Persecutory voices/beliefs?: No Depression: Yes Depression Symptoms: Feeling angry/irritable, Loss of interest in usual pleasures, Feeling worthless/self pity, Guilt, Insomnia Substance abuse history and/or treatment for substance abuse?: No Suicide prevention information given to non-admitted patients: Not applicable  Risk to Others within the past 6 months Homicidal Ideation: Yes-Currently Present Does patient have any lifetime risk of violence toward others beyond the six months prior to admission? : Yes (comment) Thoughts of Harm to Others: Yes-Currently Present Comment - Thoughts of Harm to Others: (strangle people) Current Homicidal Intent: Yes-Currently Present Current Homicidal Plan: Yes-Currently Present Describe Current Homicidal Plan: (strangle people on street if they looked at him strange) Access to Homicidal Means: Yes Describe Access to Homicidal Means: (strangle people) Identified Victim: ("anybody that looks at m strange") History of harm to others?: No Assessment of Violence: None Noted Violent Behavior Description: (none reported) Does patient have access to weapons?: No Criminal Charges Pending?: No Does patient have a court date: No Is patient on probation?:  No  Psychosis Hallucinations: None noted Delusions: None noted  Mental Status Report Appearance/Hygiene: Unremarkable Eye Contact: Unable to Assess(patient kept back to clinician) Motor Activity: Freedom of movement Speech: Slurred Level of Consciousness: Alert, Irritable Mood: Irritable Affect: Irritable Anxiety Level: Minimal Thought Processes: Relevant Judgement: Partial Orientation: Person, Place, Time, Situation Obsessive Compulsive Thoughts/Behaviors: None  Cognitive Functioning Concentration: Fair Memory: Recent Impaired, Remote Impaired Is patient IDD: No Insight: Poor Impulse Control: Poor Appetite: Good Have you had any weight changes? : No Change Sleep: Decreased Total Hours of Sleep: ("none") Vegetative Symptoms: None  ADLScreening [facility] Assessment Services) Patient's cognitive ability adequate to safely complete daily activities?: Yes Patient able to express need for assistance with ADLs?: Yes Independently performs ADLs?: Yes (appropriate for developmental age)  Prior Inpatient Therapy Prior Inpatient Therapy: Yes Prior Therapy Dates: (unknown) Prior Therapy Facilty/Provider(s): (unknown) Reason for Treatment: (schizophrenia)  Prior Outpatient Therapy Prior Outpatient Therapy: No Does patient have an ACCT team?: No Does patient have Intensive In-House Services?  : No Does patient have [facility] services? : No Does patient have P4CC services?: No  ADL Screening (condition at time of admission) Patient's cognitive ability adequate to safely complete daily activities?: Yes Patient able to express need for assistance with ADLs?: Yes Independently performs ADLs?: Yes (appropriate for developmental age)  Disposition:  Disposition Initial Assessment Completed for this Encounter: Yes  Lindon Romp, NP, recommends overnight observation for safety and stabilization with psych reevaluation in the a.m. AC informed.  This service was provided via telemedicine  using a 2-way, interactive audio and video technology.  Names of all persons participating in this telemedicine service and their role in this encounter. Name: [name] Role: Patient  Name: Kirtland Bouchard Role: TTS Clinician  Name:  Role:   Name:  Role:     Venora Maples 05/02/2019 11:01 PM

## 2019-05-02 NOTE — ED Triage Notes (Addendum)
Pt brought in by EMS with complaints of numbness to left hand and constipation. Pt then states he wants to kill someone and kill himself. States he is no longer on Clonopin that he wouldn't be in this situation. Pt wants to hang self

## 2019-05-02 NOTE — ED Notes (Signed)
[employer] in to speak with pt at this time and serve him with IVC paperwork.

## 2019-05-03 DIAGNOSIS — K5900 Constipation, unspecified: Secondary | ICD-10-CM | POA: Diagnosis not present

## 2019-05-03 LAB — HEMOGLOBIN A1C
Hgb A1c MFr Bld: 8.8 % — ABNORMAL HIGH (ref 4.8–5.6)
Mean Plasma Glucose: 205.86 mg/dL

## 2019-05-03 LAB — CBG MONITORING, ED
Glucose-Capillary: 246 mg/dL — ABNORMAL HIGH (ref 70–99)
Glucose-Capillary: 108 mg/dL — ABNORMAL HIGH (ref 70–99)
Glucose-Capillary: 246 mg/dL — ABNORMAL HIGH (ref 70–99)
Glucose-Capillary: 235 mg/dL — ABNORMAL HIGH (ref 70–99)

## 2019-05-03 MED ORDER — INSULIN ASPART 100 UNIT/ML ~~LOC~~ SOLN
0.0000 [IU] | Freq: Three times a day (TID) | SUBCUTANEOUS | Status: DC
  Filled 2019-05-03: qty 1

## 2019-05-03 MED ORDER — INSULIN ASPART 100 UNIT/ML ~~LOC~~ SOLN
0.0000 [IU] | Freq: Three times a day (TID) | SUBCUTANEOUS | Status: DC
  Administered 2019-05-03 (×2): 5 [IU] via SUBCUTANEOUS
  Administered 2019-05-05: 3 [IU] via SUBCUTANEOUS
  Administered 2019-05-06: 09:00:00 8 [IU] via SUBCUTANEOUS
  Filled 2019-05-03 (×3): qty 1

## 2019-05-03 NOTE — ED Notes (Signed)
[facility] called and stated patient has no more psych days covered by his insurance. Stated pt would be responsible for paying out of pocket up front before he could receive treatment. I gave [place] number. They stated they would try the number and if no answer then they would call [place] in the morning.

## 2019-05-03 NOTE — BH Assessment (Signed)
Reassessment: Pt denies SI and HI. Pt reports he has not been compliant with medication and that's why he stated "those things". Pt reports he lives alone. Pt reports he ate and slept well.  Per Ollen Bowl, NP pt continues to meet inpatient criteria. CSW is currently looking for placement.

## 2019-05-03 NOTE — Progress Notes (Signed)
Pt. meets criteria for inpatient treatment per Mordecai Maes, NP.  No appropriate beds available at [facility]. Referred out to the following hospitals: [facility] Details [place] Details [facility] Details [facility] Details [place] Details [facility] Details [facility] Details [facility] Details [facility] Details [facility] Details [place] Details [facility] Details [facility] Details [facility]  Disposition CSW will continue to follow for placement.  Areatha Keas. Judi Cong, MSW, [place] Disposition Clinical Social Work [phone] (cell) [phone] (office)

## 2019-05-04 DIAGNOSIS — K5900 Constipation, unspecified: Secondary | ICD-10-CM | POA: Diagnosis not present

## 2019-05-04 NOTE — BH Assessment (Signed)
[place] Assessment Progress Note    Per Priscille Loveless, NP, patient is still recommended for inpatient treatment at this time due to his recent suicidal thoughts with plan.  He has too many risk factors for discharge at this time.

## 2019-05-04 NOTE — BH Assessment (Signed)
[facility] Assessment Progress Note   Patient was seen for reassessment this date.  Patient states that he is feeling better today and states, "I think I want to go home today."  When asked if he is still feeling suicidal or homicidal, patient states, "no."   When asked what has happened to change his thinking, patient states, "I have been taking my medication and I feel much better."  Patient states that he lives alone, but states that he has a friend who is supportive and will help him with his daily living needs.  TTS to staff case with provider for final disposition.

## 2019-05-05 ENCOUNTER — Emergency Department (HOSPITAL_COMMUNITY): Payer: Medicare Other

## 2019-05-05 DIAGNOSIS — K5900 Constipation, unspecified: Secondary | ICD-10-CM | POA: Diagnosis not present

## 2019-05-05 DIAGNOSIS — R079 Chest pain, unspecified: Secondary | ICD-10-CM | POA: Diagnosis not present

## 2019-05-05 LAB — CBG MONITORING, ED
Glucose-Capillary: 261 mg/dL — ABNORMAL HIGH (ref 70–99)
Glucose-Capillary: 194 mg/dL — ABNORMAL HIGH (ref 70–99)
Glucose-Capillary: 184 mg/dL — ABNORMAL HIGH (ref 70–99)

## 2019-05-05 LAB — TROPONIN I (HIGH SENSITIVITY): Troponin I (High Sensitivity): 4 ng/L (ref <18)

## 2019-05-05 MED ORDER — ACETAMINOPHEN 325 MG PO TABS: 650.0000 mg | ORAL_TABLET | Freq: Once | ORAL | Status: DC

## 2019-05-05 MED ORDER — LORAZEPAM 1 MG PO TABS
1.0000 mg | ORAL_TABLET | ORAL | Status: AC | PRN
  Administered 2019-05-05: 1 mg via ORAL
  Filled 2019-05-05: qty 1

## 2019-05-05 MED ORDER — RISPERIDONE 1 MG PO TBDP
2.0000 mg | ORAL_TABLET | Freq: Three times a day (TID) | ORAL | Status: DC | PRN
  Administered 2019-05-06: 2 mg via ORAL
  Filled 2019-05-05 (×2): qty 2

## 2019-05-05 MED ORDER — ZIPRASIDONE MESYLATE 20 MG IM SOLR: 20.0000 mg | INTRAMUSCULAR | Status: DC | PRN

## 2019-05-05 NOTE — Progress Notes (Signed)
CSW contacted referral facilities with the following results:  Still reviewing: [facility] (resent per request) [facility]  Denied: [facility] (medical issues) [facility] (insurance will not cover) [facility] (no bed availability) [facility] (at capacity) [facility] (no outside admissions) [facility] (at capacity) [facility] (at capacity) [facility] (at capacity)  TTS will continue to seek bed placement.  Chalmers Guest. Guerry Bruin, MSW, [place] Work/Disposition Phone: [phone] Fax: [phone]

## 2019-05-05 NOTE — ED Notes (Signed)
Pt given lunch tray.

## 2019-05-05 NOTE — ED Notes (Signed)
tts in progress 

## 2019-05-05 NOTE — ED Notes (Signed)
Pt sleeping. Nad. Chest rise and fall noted. Sitter at bedside.

## 2019-05-05 NOTE — ED Notes (Signed)
Per BH, pt may be getting dc soon. Meal tray given. Pt only drank the milk.

## 2019-05-05 NOTE — ED Provider Notes (Signed)
6:12 AM  Informed by nursing that patient is complaining of chest and back pain.  Described as a hot poker sensation.  Worse with laying on his side.  EKG obtained.  He has some nonspecific EKG changes with T wave inversions but otherwise no acute ischemic changes.  We will add a chest x-ray and troponin given his risk factors.   Merryl Hacker, MD 05/05/19 [phone]

## 2019-05-05 NOTE — ED Notes (Addendum)
Pt refused blood work for troponin, pt states he just wants pain meds, EDP made aware

## 2019-05-05 NOTE — BHH Counselor (Signed)
This clinician spoke with patient's sister, [name] [phone]): She states that she hasn't seen her brother in "a while." TTS explained we were considering discharge for patient and wanted to know if he was a danger to himself or other and she stated "I don't know." TTS asked if she had any input that would be helpful in determining care for patient and she stated "no." This clinician staffed with Elmarie Shiley, [place] who recommends in patient due to patient history and lack of collateral information.

## 2019-05-05 NOTE — BHH Counselor (Addendum)
TTS spoke with patient's uncle, Fritz Pickerel [phone]) who states that patient has a long history of paranoid schizophrenia. He states patient's sister, [name], had allowed patient to live with her for a period of time but she had to take out a restraining order against him so he now lives in a boarding home. He states that he does not have much contact with patient as he has moved from group home to group home for 35 years. He does not know if patient is a danger to himself or others. Fritz Pickerel states that he is not able to care for patient. Continues to meet in patient criteria.

## 2019-05-05 NOTE — ED Notes (Signed)
EKG given to EDP.  

## 2019-05-05 NOTE — ED Notes (Signed)
Pt given breakfast tray

## 2019-05-05 NOTE — ED Notes (Signed)
Sitter to desk stating pt is c/o chest pain and states he feels like a hot poker is stabbing him, EDP made aware, EKG requested

## 2019-05-05 NOTE — BHH Counselor (Addendum)
TTS reassessment: Patient is alert and oriented x 4. He denies SI/HI/AVH. He states he has nerve pain and nothing is helping. Patient states "Can I go home?" This clinician will staff with Ricky Ala, NP for potential discharge.  Patient gave verbal consent for TTS to contact his sister, Alfonzo Beers ([phone]) and niece, Dennis Bast [phone]) for collateral information. This clinician attempted to reach both parties and neither answered. Neither had a voicemail set up. TTS will continue to attempt to get collateral. Per Elmarie Shiley, NP disposition is pending collateral information.

## 2019-05-06 DIAGNOSIS — K5900 Constipation, unspecified: Secondary | ICD-10-CM | POA: Diagnosis not present

## 2019-05-06 LAB — CBG MONITORING, ED: Glucose-Capillary: 263 mg/dL — ABNORMAL HIGH (ref 70–99)

## 2019-05-06 MED ORDER — LORAZEPAM 1 MG PO TABS
1.0000 mg | ORAL_TABLET | Freq: Two times a day (BID) | ORAL | Status: AC | PRN
  Administered 2019-05-06: 1 mg via ORAL
  Filled 2019-05-06: qty 1

## 2019-05-06 NOTE — ED Provider Notes (Signed)
I have been at this emergency department for the last 2 days working and have seen this patient walking ambulating and interacting in a very pleasant fashion with staff.  He is denying suicidal ideations.  Psychiatry is cleared the patient for discharge.  He will follow-[facility] in [facility].   Noemi Chapel, MD 05/06/19 1120

## 2019-05-06 NOTE — Discharge Instructions (Signed)
Please follow-up at [facility], you will need to be seen within the next week.  You may return to the emergency department for any severe or worsening symptoms

## 2019-05-06 NOTE — ED Notes (Signed)
With [facility] approval. Pt given cab voucher for central cab. Pt in waiting room. Pt given belongings in locker.

## 2019-05-06 NOTE — BHH Counselor (Signed)
Pt remains in the ED due to expressing suicidal and homicidal ideation.  Pt denied these ideations today, and he stated (as he did for two days ''I'm ready to go home.''  Pt stated that he is followed by [facility], and he encouraged author to contact them.    Yesterday, TTS was able to get collateral from family members who stated thhat Pt has a history of paranoid schizophrenia and that he is not able to care for himself.    Reached out to [facility] at [phone].  They did not know that Pt was at the hospital.  They stated that Pt is well-known to them, that he frequents the hospital, and that they will follow-up with him for five days.  Consulted with T. Money, NP, who determined that Pt is psych-cleared with follow-up to [facility].

## 2019-05-06 NOTE — ED Notes (Addendum)
Per Network engineer, [place] called and stated that pt is cleared for discharge. EDP notified.

## 2019-05-26 ENCOUNTER — Other Ambulatory Visit: Payer: Self-pay

## 2019-05-26 ENCOUNTER — Emergency Department (HOSPITAL_COMMUNITY)
Admission: EM | Admit: 2019-05-26 | Discharge: 2019-05-27 | Disposition: A | Discharge status: Home / Self Care | Payer: Medicare Other | Source: Home / Self Care | Attending: Emergency Medicine | Admitting: Emergency Medicine

## 2019-05-26 ENCOUNTER — Encounter (HOSPITAL_COMMUNITY): Payer: Self-pay | Admitting: Emergency Medicine

## 2019-05-26 DIAGNOSIS — I10 Essential (primary) hypertension: Secondary | ICD-10-CM | POA: Insufficient documentation

## 2019-05-26 DIAGNOSIS — Z7982 Long term (current) use of aspirin: Secondary | ICD-10-CM | POA: Insufficient documentation

## 2019-05-26 DIAGNOSIS — E119 Type 2 diabetes mellitus without complications: Secondary | ICD-10-CM | POA: Insufficient documentation

## 2019-05-26 DIAGNOSIS — F17210 Nicotine dependence, cigarettes, uncomplicated: Secondary | ICD-10-CM | POA: Insufficient documentation

## 2019-05-26 DIAGNOSIS — J449 Chronic obstructive pulmonary disease, unspecified: Secondary | ICD-10-CM | POA: Insufficient documentation

## 2019-05-26 DIAGNOSIS — E1140 Type 2 diabetes mellitus with diabetic neuropathy, unspecified: Secondary | ICD-10-CM | POA: Insufficient documentation

## 2019-05-26 DIAGNOSIS — R45850 Homicidal ideations: Secondary | ICD-10-CM

## 2019-05-26 DIAGNOSIS — Z794 Long term (current) use of insulin: Secondary | ICD-10-CM | POA: Insufficient documentation

## 2019-05-26 DIAGNOSIS — F258 Other schizoaffective disorders: Secondary | ICD-10-CM | POA: Insufficient documentation

## 2019-05-26 DIAGNOSIS — F259 Schizoaffective disorder, unspecified: Secondary | ICD-10-CM

## 2019-05-26 DIAGNOSIS — Z79899 Other long term (current) drug therapy: Secondary | ICD-10-CM | POA: Insufficient documentation

## 2019-05-26 LAB — CBC WITH DIFFERENTIAL/PLATELET
Abs Immature Granulocytes: 0.03 10*3/uL (ref 0.00–0.07)
Basophils Absolute: 0.1 10*3/uL (ref 0.0–0.1)
Basophils Relative: 1 %
Eosinophils Absolute: 0.2 10*3/uL (ref 0.0–0.5)
Eosinophils Relative: 5 %
HCT: 43.8 % (ref 39.0–52.0)
Hemoglobin: 14.8 g/dL (ref 13.0–17.0)
Immature Granulocytes: 1 %
Lymphocytes Relative: 26 %
Lymphs Abs: 1.2 10*3/uL (ref 0.7–4.0)
MCH: 30.1 pg (ref 26.0–34.0)
MCHC: 33.8 g/dL (ref 30.0–36.0)
MCV: 89 fL (ref 80.0–100.0)
Monocytes Absolute: 0.5 10*3/uL (ref 0.1–1.0)
Monocytes Relative: 11 %
Neutro Abs: 2.7 10*3/uL (ref 1.7–7.7)
Neutrophils Relative %: 56 %
Platelets: 164 10*3/uL (ref 150–400)
RBC: 4.92 MIL/uL (ref 4.22–5.81)
RDW: 15.1 % (ref 11.5–15.5)
WBC: 4.7 10*3/uL (ref 4.0–10.5)
nRBC: 0 % (ref 0.0–0.2)

## 2019-05-26 LAB — COMPREHENSIVE METABOLIC PANEL
ALT: 20 U/L (ref 0–44)
AST: 16 U/L (ref 15–41)
Albumin: 4.5 g/dL (ref 3.5–5.0)
Alkaline Phosphatase: 76 U/L (ref 38–126)
Anion gap: 8 (ref 5–15)
BUN: 13 mg/dL (ref 6–20)
CO2: 24 mmol/L (ref 22–32)
Calcium: 9.6 mg/dL (ref 8.9–10.3)
Chloride: 102 mmol/L (ref 98–111)
Creatinine, Ser: 1.16 mg/dL (ref 0.61–1.24)
GFR calc Af Amer: >60 mL/min (ref >60)
GFR calc non Af Amer: >60 mL/min (ref >60)
Glucose, Bld: 205 mg/dL — ABNORMAL HIGH (ref 70–99)
Potassium: 4.2 mmol/L (ref 3.5–5.1)
Sodium: 134 mmol/L — ABNORMAL LOW (ref 135–145)
Total Bilirubin: 0.7 mg/dL (ref 0.3–1.2)
Total Protein: 7.3 g/dL (ref 6.5–8.1)

## 2019-05-26 LAB — RAPID URINE DRUG SCREEN, HOSP PERFORMED
Amphetamines: NOT DETECTED
Barbiturates: NOT DETECTED
Benzodiazepines: NOT DETECTED
Cocaine: NOT DETECTED
Opiates: NOT DETECTED
Tetrahydrocannabinol: NOT DETECTED

## 2019-05-26 LAB — ACETAMINOPHEN LEVEL: Acetaminophen (Tylenol), Serum: <10 ug/mL — ABNORMAL LOW (ref 10–30)

## 2019-05-26 LAB — SALICYLATE LEVEL: Salicylate Lvl: <7 mg/dL (ref 2.8–30.0)

## 2019-05-26 LAB — ETHANOL: Alcohol, Ethyl (B): <10 mg/dL (ref <10)

## 2019-05-26 MED ORDER — LORAZEPAM 1 MG PO TABS
1.0000 mg | ORAL_TABLET | Freq: Once | ORAL | Status: AC
  Administered 2019-05-26: 23:00:00 1 mg via ORAL
  Filled 2019-05-26: qty 1

## 2019-05-26 MED ORDER — GABAPENTIN 400 MG PO CAPS
800.0000 mg | ORAL_CAPSULE | Freq: Once | ORAL | Status: AC
  Administered 2019-05-26: 800 mg via ORAL
  Filled 2019-05-26: qty 2

## 2019-05-26 NOTE — ED Notes (Signed)
Pt gave cell phone, wallet, keys, lighter, cigs, & no money. locked up w/security

## 2019-05-26 NOTE — ED Notes (Signed)
Per pt request, cell phone locked up w/security

## 2019-05-26 NOTE — ED Notes (Signed)
Pt changed into purple scrubs . NT to do EKG

## 2019-05-26 NOTE — ED Notes (Signed)
Pt refuses UA & EKG

## 2019-05-26 NOTE — ED Notes (Signed)
Pt given bag meal

## 2019-05-26 NOTE — ED Notes (Signed)
NT assisting pt to br at this time.

## 2019-05-26 NOTE — ED Notes (Signed)
Pt stated that he wants his ativan and neurontin. Pa aware
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
## 2019-05-26 NOTE — ED Notes (Signed)
Pt returned from TTS. Pt yelling and cursing at staff. Saying all the staff is "goddamn stupid" rn advised that is not appropriate behavior

## 2019-05-26 NOTE — ED Provider Notes (Signed)
[facility] EMERGENCY DEPARTMENT Provider Note   CSN: [id number] Arrival date & time: 05/26/19  1759    History   Chief Complaint Chief Complaint  Patient presents with   V70.1    HPI [name] is a 58 y.o. male with a history of schizoaffective disorder, anxiety, history of etoh abuse (denies current drinking issues), h/o narcotic abuse, COPD, HTN, DM presenting with a SI/HI ideation and increased anxiety and some visual hallucinations. He denies recent suicide attempts or gestures but thinks about hanging himself which he has attempted in the past.  He states this week has been angered by those around him, perceived as threatening to him and if he had a gun (reports he does not have access) would have "shot them all".  He reports visual hallucinations, stating he sees shadows that he thinks are people but are not, denies auditory hallucinations. He was seen here last month for similar concerns, was not admitted but he states he needs to come.  He is here voluntarily.      The history is provided by the patient.    Past Medical History:  Diagnosis Date   Alcohol dependence ([place]) 1979   stated abusing ETOH at age 6    Back pain    Benzodiazepine dependence ([place])    Cardiac arrest ([place])    Chronic back pain    Cocaine abuse ([place])    COPD (chronic obstructive pulmonary disease) ([place])    Diabetes mellitus without complication ([place]) 8242   Hypertension 2008   MI (myocardial infarction) ([place])    Non-compliance    Schizoaffective disorder ([place]) 2006    Substance abuse ([place])     Patient Active Problem List   Diagnosis Date Noted   Overdose 04/14/2018   MDD (major depressive disorder) 11/12/2017   MDD (major depressive disorder), recurrent severe, without psychosis ([place]) 10/12/2017   Diabetic neuropathy ([place]) 11/11/2016   Anxiety and depression 11/11/2016   Chronic insomnia 01/08/2016   Alcohol use disorder 12/10/2015   Benzodiazepine abuse ([place])  12/10/2015   Opioid abuse ([place]) 12/10/2015   Constipation 02/06/2015   BPH (benign prostatic hyperplasia) 01/16/2015   Vitamin D deficiency 01/16/2015   Allergic rhinitis 11/19/2014   Chronic low back pain 11/07/2014   Onychomycosis of toenail 11/07/2014   Tinea pedis 11/07/2014   Psoriasis 10/14/2014   HTN (hypertension) 10/14/2014   Diabetes mellitus type 2, controlled ([place]) 10/14/2014   COPD (chronic obstructive pulmonary disease) ([place]) 10/14/2014   Tobacco abuse 10/14/2014   HLD (hyperlipidemia) 10/14/2014   Schizoaffective disorder, depressive type ([place]) 10/14/2014    Past Surgical History:  Procedure Laterality Date   CYST EXCISION          Home Medications    Prior to Admission medications   Medication Sig Start Date End Date Taking? Authorizing Provider  albuterol (PROVENTIL HFA;VENTOLIN HFA) 108 (90 Base) MCG/ACT inhaler Inhale 2 puffs into the lungs every 6 (six) hours as needed for wheezing or shortness of breath. 11/15/17   Lindell Spar I, NP  aspirin EC 81 MG tablet Take 81 mg by mouth daily.    [provider]  atorvastatin (LIPITOR) 40 MG tablet TAKE 1 TABLET BY MOUTH EVERY DAY: For high Cholesterol Patient taking differently: Take 40 mg by mouth daily at 6 PM.  11/15/17   [place], Loleta Dicker, NP  baclofen (LIORESAL) 10 MG tablet Take 10 mg by mouth 3 (three) times daily.    [provider]  budesonide-formoterol (SYMBICORT) 80-4.5 MCG/ACT  inhaler Inhale 2 puffs into the lungs 2 (two) times daily.    [provider]  gabapentin (NEURONTIN) 800 MG tablet Take 800 mg by mouth 4 (four) times daily. 12/08/17   [provider]  hydrOXYzine (ATARAX/VISTARIL) 10 MG tablet Take 10 mg by mouth 3 (three) times daily.    [provider]  ibuprofen (ADVIL,MOTRIN) 800 MG tablet Take 1 tablet (800 mg total) by mouth every 6 (six) hours as needed (moderate to severe pain). 11/15/17   Lindell Spar I, NP  insulin aspart  (NOVOLOG) 100 UNIT/ML injection Inject 1-30 Units into the skin 3 (three) times daily with meals. SLIDING SCALE 02/12/19   [provider]  isosorbide mononitrate (IMDUR) 30 MG 24 hr tablet Take 30 mg by mouth daily.    [provider]  LEVEMIR 100 UNIT/ML injection Inject 20 Units into the skin at bedtime.  04/26/19   [provider]  lisinopril (PRINIVIL,ZESTRIL) 10 MG tablet Take 1 tablet by mouth daily. 12/12/17   [provider]  metFORMIN (GLUCOPHAGE-XR) 500 MG 24 hr tablet Take 1 tablet (500 mg total) by mouth 2 (two) times daily with a meal. For diabetes management Patient taking differently: Take 1,000 mg by mouth 2 (two) times daily with a meal. For diabetes management 11/15/17   Lindell Spar I, NP  metoprolol succinate (TOPROL-XL) 25 MG 24 hr tablet Take 50 mg by mouth daily.    [provider]  nitroGLYCERIN (NITROSTAT) 0.4 MG SL tablet Place 0.4 mg under the tongue every 5 (five) minutes as needed for chest pain.    [provider]  omeprazole (PRILOSEC) 20 MG capsule Take 20 mg by mouth daily.    [provider]  paliperidone (INVEGA SUSTENNA) 234 MG/1.5ML SUSP injection Inject 234 mg into the muscle every 30 (thirty) days. (Due on 11-14-17): For mood control 11/15/17   Lindell Spar I, NP    Family History Family History  Problem Relation Age of Onset   Heart disease Mother    Heart disease Father    Heart disease Maternal Grandmother    Diabetes Maternal Grandmother     Social History Social History   Tobacco Use   Smoking status: Current Every Day Smoker    Packs/day: 1.50    Types: Cigarettes   Smokeless tobacco: Never Used  Substance Use Topics   Alcohol use: Yes    Alcohol/week: 0.0 standard drinks    Comment: last drank week ago   Drug use: Not Currently    Types: Cocaine    Comment: Denies any use in past 6 months     Allergies   Quetiapine, Seroquel [quetiapine fumarate], Trazodone,  Trazodone and nefazodone, Vistaril [hydroxyzine hcl], and Haldol [haloperidol lactate]   Review of Systems Review of Systems  Constitutional: Negative for fever.  HENT: Negative for congestion and sore throat.   Eyes: Negative.   Respiratory: Negative for chest tightness and shortness of breath.   Cardiovascular: Negative for chest pain.  Gastrointestinal: Negative for abdominal pain and nausea.  Genitourinary: Negative.   Musculoskeletal: Negative for arthralgias, joint swelling and neck pain.  Skin: Negative.  Negative for rash and wound.  Neurological: Negative for dizziness, weakness, light-headedness, numbness and headaches.  Psychiatric/Behavioral: Positive for dysphoric mood, hallucinations and suicidal ideas. The patient is nervous/anxious.      Physical Exam Updated Vital Signs BP (!) 177/93 (BP Location: Right Arm)    Pulse (!) 101    Temp 97.6 F (36.4 C) (Oral)  Resp 16    Wt 99.6 kg    SpO2 96%    BMI 29.77 kg/m   Physical Exam Vitals signs and nursing note reviewed.  Constitutional:      Appearance: He is well-developed.  HENT:     Head: Normocephalic and atraumatic.  Eyes:     Conjunctiva/sclera: Conjunctivae normal.  Neck:     Musculoskeletal: Normal range of motion.  Cardiovascular:     Rate and Rhythm: Regular rhythm. Tachycardia present.     Heart sounds: Normal heart sounds.     Comments: Borderline tachycardic Pulmonary:     Effort: Pulmonary effort is normal.     Breath sounds: Normal breath sounds. No wheezing.  Abdominal:     General: Abdomen is protuberant. Bowel sounds are normal.     Palpations: Abdomen is soft.     Tenderness: There is no abdominal tenderness.  Musculoskeletal: Normal range of motion.  Skin:    General: Skin is warm and dry.  Neurological:     General: No focal deficit present.     Mental Status: He is alert.  Psychiatric:        Attention and Perception: He does not perceive auditory or visual hallucinations.         Mood and Affect: Mood is anxious. Affect is labile.        Speech: Speech normal. Speech is not rapid and pressured.        Behavior: Behavior is agitated.        Thought Content: Thought content does not include homicidal or suicidal ideation.     Comments: Mildly agitated, fixated on receiving anxiety and pain meds (neurontin).       ED Treatments / Results  Labs (all labs ordered are listed, but only abnormal results are displayed) Labs Reviewed  COMPREHENSIVE METABOLIC PANEL - Abnormal; Notable for the following components:      Result Value   Sodium 134 (*)    Glucose, Bld 205 (*)    All other components within normal limits  ACETAMINOPHEN LEVEL - Abnormal; Notable for the following components:   Acetaminophen (Tylenol), Serum <10 (*)    All other components within normal limits  ETHANOL  RAPID URINE DRUG SCREEN, HOSP PERFORMED  CBC WITH DIFFERENTIAL/PLATELET  SALICYLATE LEVEL    EKG None  Radiology No results found.  Procedures Procedures (including critical care time)  Medications Ordered in ED Medications  LORazepam (ATIVAN) tablet 1 mg (1 mg Oral Given 05/26/19 2305)  gabapentin (NEURONTIN) capsule 800 mg (800 mg Oral Given 05/26/19 2305)     Initial Impression / Assessment and Plan / ED Course  I have reviewed the triage vital signs and the nursing notes.  Pertinent labs & imaging results that were available during my care of the patient were reviewed by me and considered in my medical decision making (see chart for details).        Pending result of UDS, pt initially refused to give urine sample.  TTS ordered, pending recommendations.   Final Clinical Impressions(s) / ED Diagnoses   Final diagnoses:  Depression with suicidal ideation  Schizo-affective schizophrenia, chronic condition [facility])  Homicidal ideation    ED Discharge Orders    None       Landis Martins 05/26/19 2330    Nat Christen, MD 05/27/19 1929

## 2019-05-26 NOTE — ED Notes (Signed)
Pt ambulating to br at this time, pt advised to get sample, pt refused

## 2019-05-27 LAB — CBG MONITORING, ED
Glucose-Capillary: 219 mg/dL — ABNORMAL HIGH (ref 70–99)
Glucose-Capillary: 175 mg/dL — ABNORMAL HIGH (ref 70–99)

## 2019-05-27 MED ORDER — NICOTINE 21 MG/24HR TD PT24
21.0000 mg | MEDICATED_PATCH | Freq: Every day | TRANSDERMAL | Status: DC
  Administered 2019-05-27: 06:00:00 21 mg via TRANSDERMAL
  Filled 2019-05-27 (×2): qty 1

## 2019-05-27 MED ORDER — ACETAMINOPHEN 325 MG PO TABS
650.0000 mg | ORAL_TABLET | ORAL | Status: DC | PRN
  Administered 2019-05-27 (×2): 650 mg via ORAL
  Filled 2019-05-27 (×2): qty 2

## 2019-05-27 MED ORDER — MOMETASONE FURO-FORMOTEROL FUM 100-5 MCG/ACT IN AERO
INHALATION_SPRAY | RESPIRATORY_TRACT | Status: AC
  Filled 2019-05-27: qty 8.8

## 2019-05-27 MED ORDER — PANTOPRAZOLE SODIUM 40 MG PO TBEC
40.0000 mg | DELAYED_RELEASE_TABLET | Freq: Every day | ORAL | Status: DC
  Administered 2019-05-27: 40 mg via ORAL
  Filled 2019-05-27: qty 1

## 2019-05-27 MED ORDER — INSULIN DETEMIR 100 UNIT/ML ~~LOC~~ SOLN
20.0000 [IU] | Freq: Every day | SUBCUTANEOUS | Status: DC
  Administered 2019-05-27: 01:00:00 20 [IU] via SUBCUTANEOUS
  Filled 2019-05-27 (×2): qty 0.2
  Filled 2019-05-27: qty 1

## 2019-05-27 MED ORDER — HYDROXYZINE HCL 25 MG PO TABS
25.0000 mg | ORAL_TABLET | Freq: Four times a day (QID) | ORAL | Status: DC | PRN
  Administered 2019-05-27: 25 mg via ORAL
  Filled 2019-05-27: qty 1

## 2019-05-27 MED ORDER — ALBUTEROL SULFATE HFA 108 (90 BASE) MCG/ACT IN AERS: 2.0000 | INHALATION_SPRAY | Freq: Four times a day (QID) | RESPIRATORY_TRACT | Status: DC | PRN

## 2019-05-27 MED ORDER — ATORVASTATIN CALCIUM 40 MG PO TABS: 40.0000 mg | ORAL_TABLET | Freq: Every day | ORAL | Status: DC

## 2019-05-27 MED ORDER — METFORMIN HCL ER 500 MG PO TB24
1000.0000 mg | ORAL_TABLET | Freq: Two times a day (BID) | ORAL | Status: DC
  Filled 2019-05-27 (×3): qty 2

## 2019-05-27 MED ORDER — ASPIRIN EC 81 MG PO TBEC
81.0000 mg | DELAYED_RELEASE_TABLET | Freq: Every day | ORAL | Status: DC
  Administered 2019-05-27: 81 mg via ORAL
  Filled 2019-05-27: qty 1

## 2019-05-27 MED ORDER — LISINOPRIL 10 MG PO TABS
10.0000 mg | ORAL_TABLET | Freq: Every day | ORAL | Status: DC
  Filled 2019-05-27: qty 1

## 2019-05-27 MED ORDER — ONDANSETRON HCL 4 MG PO TABS: 4.0000 mg | ORAL_TABLET | Freq: Three times a day (TID) | ORAL | Status: DC | PRN

## 2019-05-27 MED ORDER — METOPROLOL SUCCINATE ER 25 MG PO TB24
50.0000 mg | ORAL_TABLET | Freq: Every day | ORAL | Status: DC
  Filled 2019-05-27: qty 2

## 2019-05-27 MED ORDER — MOMETASONE FURO-FORMOTEROL FUM 100-5 MCG/ACT IN AERO
2.0000 | INHALATION_SPRAY | Freq: Two times a day (BID) | RESPIRATORY_TRACT | Status: DC
  Filled 2019-05-27: qty 8.8

## 2019-05-27 MED ORDER — ISOSORBIDE MONONITRATE ER 30 MG PO TB24
30.0000 mg | ORAL_TABLET | Freq: Every day | ORAL | Status: DC
  Filled 2019-05-27 (×2): qty 1

## 2019-05-27 MED ORDER — GABAPENTIN 400 MG PO CAPS
800.0000 mg | ORAL_CAPSULE | Freq: Four times a day (QID) | ORAL | Status: DC
  Administered 2019-05-27: 800 mg via ORAL
  Filled 2019-05-27: qty 2

## 2019-05-27 NOTE — ED Provider Notes (Signed)
Pt has been re-evaluated by psych team: Pt denies SI/HI, does not meet intp criteria at this time and can be discharged with outpt f/u. Will d/c stable.    Francine Graven, DO 05/27/19 1108

## 2019-05-27 NOTE — ED Notes (Signed)
Pt walking to BR

## 2019-05-27 NOTE — Discharge Instructions (Signed)
Take your usual prescriptions as previously directed.  Call your regular medical doctor and your mental health provider tomorrow to schedule a follow up appointment this week.  Return to the Emergency Department immediately sooner if worsening.

## 2019-05-27 NOTE — ED Notes (Signed)
Pt yelling out wanting Ativan. Then states he cant breathe is nose is congested. Spoke with EDP

## 2019-05-27 NOTE — BH Assessment (Signed)
Tele Assessment Note   Patient Name: [name] MRN: [id number] Referring Physician:  Location of Patient:  Location of Provider:     11Patient is a 58 year old male.  Patient reports suicidal with a plan to overdose.  Patient reports that he has been off his psychiatric medication for a month.   Patient is a poor historian and he was angry throughout the assessment due to requesting and not receiving Neurontin for the pain in his hands.   Per documentation in the chart, patient reports that he was seen here in ED last month for same reason and never sent to a facility.  Patient states seeing shadows, feels like people.  Patient easily sidetracked by thoughts of his anxiety. Patient paranoid.   Patient states SI and HI started due to the way people were treating him.   Per documentation in the chart, patient reports a prior attempt to hang himself.  Patient refused to state if he has been in an inpatient psychiatric hospitalization.   Patient reports that he is divorced and does to have any children, friends or social supports.   Patient denies substance abuse.    Diagnosis: Schizoaffective   Past Medical History:  Past Medical History:  Diagnosis Date  . Alcohol dependence ([place]) 1979   stated abusing ETOH at age 37   . Back pain   . Benzodiazepine dependence ([place])   . Cardiac arrest ([place])   . Chronic back pain   . Cocaine abuse ([place])   . COPD (chronic obstructive pulmonary disease) ([place])   . Diabetes mellitus without complication ([place]) 0037  . Hypertension 2008  . MI (myocardial infarction) ([place])   . Non-compliance   . Schizoaffective disorder ([place]) 2006   . Substance abuse [facility])     Past Surgical History:  Procedure Laterality Date  . CYST EXCISION      Family History:  Family History  Problem Relation Age of Onset  . Heart disease Mother   . Heart disease Father   . Heart disease Maternal Grandmother   . Diabetes Maternal Grandmother     Social  History:  reports that he has been smoking cigarettes. He has been smoking about 1.50 packs per day. He has never used smokeless tobacco. He reports current alcohol use. He reports previous drug use. Drug: Cocaine.  Additional Social History:  Alcohol / Drug Use History of alcohol / drug use?: No history of alcohol / drug abuse  CIWA: CIWA-Ar BP: (!) 177/93 Pulse Rate: (!) 101 COWS:    Allergies:  Allergies  Allergen Reactions  . Quetiapine Anaphylaxis  . Seroquel [Quetiapine Fumarate] Anxiety and Other (See Comments)    Reaction:  Nightmares   . Trazodone Anaphylaxis  . Trazodone And Nefazodone Anxiety and Other (See Comments)    Other reaction(s): Unknown Nightmares Reaction:  Nightmares   . Vistaril [Hydroxyzine Hcl] Nausea And Vomiting  . Haldol [Haloperidol Lactate] Anxiety and Other (See Comments)    Reaction:  Nightmares     Home Medications: (Not in a hospital admission)   OB/GYN Status:  No LMP for male patient.  General Assessment Data Location of Assessment: [facility] TTS Assessment: In system Is this a Tele or Face-to-Face Assessment?: Tele Assessment Is this an Initial Assessment or a Re-assessment for this encounter?: Initial Assessment Patient Accompanied by:: N/A Language Other than English: No Living Arrangements: Homeless/Shelter What gender do you identify as?: Male Marital status: Divorced Living Arrangements: (Homeless) Can pt return to current living arrangement?: Yes  Admission Status: Voluntary Is patient capable of signing voluntary admission?: Yes Referral Source: Self/Family/Friend Insurance type: (Medicaid)     Crisis Care Plan Living Arrangements: (Homeless)  Education Status Is patient currently in school?: No Is the patient employed, unemployed or receiving disability?: Receiving disability income  Risk to self with the past 6 months Suicidal Ideation: Yes-Currently Present Has patient been a risk to self within the past 6 months  prior to admission? : Yes Suicidal Intent: Yes-Currently Present Has patient had any suicidal intent within the past 6 months prior to admission? : Yes Is patient at risk for suicide?: Yes Suicidal Plan?: Yes-Currently Present Has patient had any suicidal plan within the past 6 months prior to admission? : Yes Specify Current Suicidal Plan: overdose on medication  Access to Means: Yes Specify Access to Suicidal Means: (Psych meds) What has been your use of drugs/alcohol within the last 12 months?: (none reported) Previous Attempts/Gestures: (refused to answer the question ) How many times?: (refused to answer the questio ) Triggers for Past Attempts: Unknown Intentional Self Injurious Behavior: None Family Suicide History: No Recent stressful life event(s): Job Loss, Financial Problems, Other (Comment)(Homeless and Medical ) Persecutory voices/beliefs?: Yes Depression: Yes Depression Symptoms: Despondent, Isolating, Fatigue, Loss of interest in usual pleasures, Feeling worthless/self pity, Feeling angry/irritable Substance abuse history and/or treatment for substance abuse?: No Suicide prevention information given to non-admitted patients: Yes  Risk to Others within the past 6 months Homicidal Ideation: Yes-Currently Present Does patient have any lifetime risk of violence toward others beyond the six months prior to admission? : Yes (comment) Thoughts of Harm to Others: Yes-Currently Present Comment - Thoughts of Harm to Others: Denies wanting to harm anyone specifically  Current Homicidal Intent: No Current Homicidal Plan: No Describe Current Homicidal Plan: (na) Access to Homicidal Means: (na) Describe Access to Homicidal Means: no Identified Victim: no  History of harm to others?: No Assessment of Violence: None Noted Violent Behavior Description: NA Does patient have access to weapons?: No Criminal Charges Pending?: No Does patient have a court date: No Is patient on  probation?: No  Psychosis Hallucinations: Auditory Delusions: None noted  Mental Status Report Appearance/Hygiene: Disheveled, Poor hygiene, In scrubs Eye Contact: Fair Motor Activity: Freedom of movement Speech: Aggressive, Argumentative, Loud Level of Consciousness: Alert, Irritable Mood: Depressed, Anxious, Irritable Affect: Angry, Irritable Anxiety Level: Moderate Thought Processes: Coherent, Relevant Judgement: Unimpaired Orientation: Person, Place, Time, Situation Obsessive Compulsive Thoughts/Behaviors: None  Cognitive Functioning Concentration: Fair Memory: Recent Intact, Remote Intact Is patient IDD: No Insight: Poor Impulse Control: Poor Appetite: Fair Have you had any weight changes? : No Change Sleep: Decreased Total Hours of Sleep: 4 Vegetative Symptoms: None  ADLScreening [facility] Assessment Services) Patient's cognitive ability adequate to safely complete daily activities?: Yes Patient able to express need for assistance with ADLs?: Yes Independently performs ADLs?: Yes (appropriate for developmental age)  Prior Inpatient Therapy Prior Inpatient Therapy: (Rerfused to answer the question )  Prior Outpatient Therapy Prior Outpatient Therapy: Yes Prior Therapy Dates: unable to remember Prior Therapy Facilty/Provider(s): unable to remember Reason for Treatment: medication management  Does patient have an ACCT team?: No Does patient have Intensive In-House Services?  : No Does patient have [facility] services? : No Does patient have P4CC services?: No  ADL Screening (condition at time of admission) Patient's cognitive ability adequate to safely complete daily activities?: Yes Patient able to express need for assistance with ADLs?: Yes Independently performs ADLs?: Yes (appropriate for developmental age)  Abuse/Neglect Assessment (Assessment to be complete while patient is alone) Abuse/Neglect Assessment Can Be Completed: (None Reported) Values /  Beliefs Cultural Requests During Hospitalization: None Spiritual Requests During Hospitalization: None Consults Spiritual Care Consult Needed: No Social Work Consult Needed: No Regulatory affairs officer (For Healthcare) Does Patient Have a Medical Advance Directive?: No          Disposition: Per Patriciaann Clan, PA- - patient meets criteria for inpatient psychiatric hospitalization.   Disposition Initial Assessment Completed for this Encounter: Yes  This service was provided via telemedicine using a 2-way, interactive audio and video technology.  Names of all persons participating in this telemedicine service and their role in this encounter. Name: Kiowa Peifer L. Johnnye Sima  Role: MA. [place], Eddrick Dilone LaVerne 05/27/2019 12:22 AM

## 2019-05-27 NOTE — BHH Counselor (Signed)
Pt is a 58 year old male well known to the ED and followed by [name] who presented to the ED on 05/26/2019 with complaint of suicidal ideation.  This is a fairly common complaint for Pt.  Pt was reassessed today.  He stated that he did not feel suicidal or homicidal, and he denied hallucination.  Pt stated that he felt nervous being in a small room, and he demanded Ativan.  Pt also stated that he wants to go home so he can listen to music and relax.    [facility] -[phone].  Advised of Pt's status.   Consulted with Berneta Levins, NP.  Pt is psych-cleared and may be discharged.

## 2019-05-27 NOTE — BH Assessment (Signed)
Per Patriciaann Clan, PA- - patient meets criteria for inpatient psychiatric hospitalization. No appropriate beds at [facility].  TTS will seek placement.

## 2019-05-29 ENCOUNTER — Inpatient Hospital Stay (HOSPITAL_COMMUNITY)
Admission: EM | Admit: 2019-05-29 | Discharge: 2019-06-07 | DRG: 918 | Disposition: A | Discharge status: Home / Self Care | Payer: Medicare Other | Attending: Internal Medicine | Admitting: Internal Medicine

## 2019-05-29 DIAGNOSIS — F419 Anxiety disorder, unspecified: Secondary | ICD-10-CM | POA: Diagnosis present

## 2019-05-29 DIAGNOSIS — Z7982 Long term (current) use of aspirin: Secondary | ICD-10-CM

## 2019-05-29 DIAGNOSIS — Z03818 Encounter for observation for suspected exposure to other biological agents ruled out: Secondary | ICD-10-CM | POA: Diagnosis not present

## 2019-05-29 DIAGNOSIS — Z833 Family history of diabetes mellitus: Secondary | ICD-10-CM

## 2019-05-29 DIAGNOSIS — E785 Hyperlipidemia, unspecified: Secondary | ICD-10-CM | POA: Diagnosis present

## 2019-05-29 DIAGNOSIS — T6591XA Toxic effect of unspecified substance, accidental (unintentional), initial encounter: Secondary | ICD-10-CM

## 2019-05-29 DIAGNOSIS — F1320 Sedative, hypnotic or anxiolytic dependence, uncomplicated: Secondary | ICD-10-CM | POA: Diagnosis present

## 2019-05-29 DIAGNOSIS — Z888 Allergy status to other drugs, medicaments and biological substances status: Secondary | ICD-10-CM

## 2019-05-29 DIAGNOSIS — N400 Enlarged prostate without lower urinary tract symptoms: Secondary | ICD-10-CM | POA: Diagnosis present

## 2019-05-29 DIAGNOSIS — Z7951 Long term (current) use of inhaled steroids: Secondary | ICD-10-CM

## 2019-05-29 DIAGNOSIS — R21 Rash and other nonspecific skin eruption: Secondary | ICD-10-CM | POA: Diagnosis present

## 2019-05-29 DIAGNOSIS — I252 Old myocardial infarction: Secondary | ICD-10-CM

## 2019-05-29 DIAGNOSIS — Z8674 Personal history of sudden cardiac arrest: Secondary | ICD-10-CM

## 2019-05-29 DIAGNOSIS — Z794 Long term (current) use of insulin: Secondary | ICD-10-CM

## 2019-05-29 DIAGNOSIS — T518X2A Toxic effect of other alcohols, intentional self-harm, initial encounter: Principal | ICD-10-CM | POA: Diagnosis present

## 2019-05-29 DIAGNOSIS — Z20828 Contact with and (suspected) exposure to other viral communicable diseases: Secondary | ICD-10-CM | POA: Diagnosis present

## 2019-05-29 DIAGNOSIS — F1020 Alcohol dependence, uncomplicated: Secondary | ICD-10-CM | POA: Diagnosis present

## 2019-05-29 DIAGNOSIS — T6592XA Toxic effect of unspecified substance, intentional self-harm, initial encounter: Secondary | ICD-10-CM

## 2019-05-29 DIAGNOSIS — T1491XA Suicide attempt, initial encounter: Secondary | ICD-10-CM

## 2019-05-29 DIAGNOSIS — F5104 Psychophysiologic insomnia: Secondary | ICD-10-CM | POA: Diagnosis present

## 2019-05-29 DIAGNOSIS — F329 Major depressive disorder, single episode, unspecified: Secondary | ICD-10-CM | POA: Diagnosis present

## 2019-05-29 DIAGNOSIS — E1122 Type 2 diabetes mellitus with diabetic chronic kidney disease: Secondary | ICD-10-CM

## 2019-05-29 DIAGNOSIS — F17210 Nicotine dependence, cigarettes, uncomplicated: Secondary | ICD-10-CM | POA: Diagnosis present

## 2019-05-29 DIAGNOSIS — E119 Type 2 diabetes mellitus without complications: Secondary | ICD-10-CM | POA: Diagnosis present

## 2019-05-29 DIAGNOSIS — Z8249 Family history of ischemic heart disease and other diseases of the circulatory system: Secondary | ICD-10-CM

## 2019-05-29 DIAGNOSIS — F259 Schizoaffective disorder, unspecified: Secondary | ICD-10-CM | POA: Diagnosis present

## 2019-05-29 DIAGNOSIS — I10 Essential (primary) hypertension: Secondary | ICD-10-CM | POA: Diagnosis present

## 2019-05-29 DIAGNOSIS — T511X2A Toxic effect of methanol, intentional self-harm, initial encounter: Secondary | ICD-10-CM | POA: Diagnosis not present

## 2019-05-29 DIAGNOSIS — Z79899 Other long term (current) drug therapy: Secondary | ICD-10-CM

## 2019-05-29 DIAGNOSIS — J449 Chronic obstructive pulmonary disease, unspecified: Secondary | ICD-10-CM | POA: Diagnosis present

## 2019-05-29 DIAGNOSIS — D899 Disorder involving the immune mechanism, unspecified: Secondary | ICD-10-CM | POA: Diagnosis present

## 2019-05-29 DIAGNOSIS — M549 Dorsalgia, unspecified: Secondary | ICD-10-CM | POA: Diagnosis present

## 2019-05-29 DIAGNOSIS — N179 Acute kidney failure, unspecified: Secondary | ICD-10-CM | POA: Diagnosis present

## 2019-05-29 DIAGNOSIS — T50901A Poisoning by unspecified drugs, medicaments and biological substances, accidental (unintentional), initial encounter: Secondary | ICD-10-CM | POA: Diagnosis present

## 2019-05-29 LAB — BLOOD GAS, ARTERIAL
Acid-base deficit: 5.6 mmol/L — ABNORMAL HIGH (ref 0.0–2.0)
Bicarbonate: 19.7 mmol/L — ABNORMAL LOW (ref 20.0–28.0)
FIO2: 21
O2 Saturation: 81.1 %
Patient temperature: 36.8
pCO2 arterial: 35.5 mmHg (ref 32.0–48.0)
pH, Arterial: 7.349 — ABNORMAL LOW (ref 7.350–7.450)
pO2, Arterial: 44.7 mmHg — ABNORMAL LOW (ref 83.0–108.0)

## 2019-05-29 LAB — ACETAMINOPHEN LEVEL: Acetaminophen (Tylenol), Serum: <10 ug/mL — ABNORMAL LOW (ref 10–30)

## 2019-05-29 LAB — URINALYSIS, ROUTINE W REFLEX MICROSCOPIC
Bilirubin Urine: NEGATIVE
Glucose, UA: NEGATIVE mg/dL
Hgb urine dipstick: NEGATIVE
Ketones, ur: NEGATIVE mg/dL
Leukocytes,Ua: NEGATIVE
Nitrite: NEGATIVE
Protein, ur: NEGATIVE mg/dL
Specific Gravity, Urine: 1.003 — ABNORMAL LOW (ref 1.005–1.030)
pH: 6 (ref 5.0–8.0)

## 2019-05-29 LAB — COMPREHENSIVE METABOLIC PANEL
ALT: 15 U/L (ref 0–44)
AST: 16 U/L (ref 15–41)
Albumin: 4 g/dL (ref 3.5–5.0)
Alkaline Phosphatase: 63 U/L (ref 38–126)
Anion gap: 14 (ref 5–15)
BUN: 11 mg/dL (ref 6–20)
CO2: 16 mmol/L — ABNORMAL LOW (ref 22–32)
Calcium: 8.7 mg/dL — ABNORMAL LOW (ref 8.9–10.3)
Chloride: 97 mmol/L — ABNORMAL LOW (ref 98–111)
Creatinine, Ser: 1.29 mg/dL — ABNORMAL HIGH (ref 0.61–1.24)
GFR calc Af Amer: >60 mL/min (ref >60)
GFR calc non Af Amer: >60 mL/min (ref >60)
Glucose, Bld: 277 mg/dL — ABNORMAL HIGH (ref 70–99)
Potassium: 3.6 mmol/L (ref 3.5–5.1)
Sodium: 127 mmol/L — ABNORMAL LOW (ref 135–145)
Total Bilirubin: 0.7 mg/dL (ref 0.3–1.2)
Total Protein: 6.7 g/dL (ref 6.5–8.1)

## 2019-05-29 LAB — BASIC METABOLIC PANEL
Anion gap: 13 (ref 5–15)
BUN: 10 mg/dL (ref 6–20)
CO2: 17 mmol/L — ABNORMAL LOW (ref 22–32)
Calcium: 8.6 mg/dL — ABNORMAL LOW (ref 8.9–10.3)
Chloride: 101 mmol/L (ref 98–111)
Creatinine, Ser: 1.34 mg/dL — ABNORMAL HIGH (ref 0.61–1.24)
GFR calc Af Amer: >60 mL/min (ref >60)
GFR calc non Af Amer: 58 mL/min — ABNORMAL LOW (ref >60)
Glucose, Bld: 216 mg/dL — ABNORMAL HIGH (ref 70–99)
Potassium: 3.6 mmol/L (ref 3.5–5.1)
Sodium: 131 mmol/L — ABNORMAL LOW (ref 135–145)

## 2019-05-29 LAB — RAPID URINE DRUG SCREEN, HOSP PERFORMED
Amphetamines: NOT DETECTED
Barbiturates: NOT DETECTED
Benzodiazepines: NOT DETECTED
Cocaine: NOT DETECTED
Opiates: NOT DETECTED
Tetrahydrocannabinol: NOT DETECTED

## 2019-05-29 LAB — PHOSPHORUS: Phosphorus: 4 mg/dL (ref 2.5–4.6)

## 2019-05-29 LAB — LACTIC ACID, PLASMA
Lactic Acid, Venous: >11 mmol/L (ref 0.5–1.9)
Lactic Acid, Venous: >11 mmol/L (ref 0.5–1.9)

## 2019-05-29 LAB — SALICYLATE LEVEL: Salicylate Lvl: <7 mg/dL (ref 2.8–30.0)

## 2019-05-29 LAB — MAGNESIUM: Magnesium: 1.8 mg/dL (ref 1.7–2.4)

## 2019-05-29 LAB — OSMOLALITY: Osmolality: 287 mOsm/kg (ref 275–295)

## 2019-05-29 LAB — ETHANOL: Alcohol, Ethyl (B): <10 mg/dL (ref <10)

## 2019-05-29 MED ORDER — SODIUM CHLORIDE 0.9 % IV BOLUS: 1000.0000 mL | Freq: Once | INTRAVENOUS | Status: DC

## 2019-05-29 MED ORDER — SODIUM CHLORIDE 0.9 % IV SOLN
15.0000 mg/kg | Freq: Once | INTRAVENOUS | Status: AC
  Administered 2019-05-29: 1490 mg via INTRAVENOUS
  Filled 2019-05-29: qty 1.49

## 2019-05-29 MED ORDER — PYRIDOXINE HCL 100 MG/ML IJ SOLN
100.0000 mg | Freq: Every day | INTRAMUSCULAR | Status: DC
  Administered 2019-05-29 – 2019-06-07 (×9): 100 mg via INTRAVENOUS
  Filled 2019-05-29 (×11): qty 1

## 2019-05-29 MED ORDER — SODIUM CHLORIDE 0.9 % IV BOLUS
1000.0000 mL | Freq: Once | INTRAVENOUS | Status: AC
  Administered 2019-05-29: 1000 mL via INTRAVENOUS

## 2019-05-29 MED ORDER — SODIUM CHLORIDE 0.9 % IV SOLN
10.0000 mg/kg | Freq: Two times a day (BID) | INTRAVENOUS | Status: DC
  Filled 2019-05-29 (×2): qty 1

## 2019-05-29 MED ORDER — THIAMINE HCL 100 MG/ML IJ SOLN
100.0000 mg | Freq: Once | INTRAMUSCULAR | Status: AC
  Administered 2019-05-29: 100 mg via INTRAVENOUS
  Filled 2019-05-29: qty 2

## 2019-05-29 NOTE — ED Notes (Addendum)
Pt wanting something for anxiety and pain. Said "[facility] promised me something for my pain. yall are a bunch of crooks." RN apologized and stated that she will tell the provider. Pt said "I dont give a damn what you have you say you are a damn liar. I should have went to a veterinarian since I am a damn dog." RN apologized again . Pt requested Geodon.

## 2019-05-29 NOTE — ED Notes (Signed)
Date and time results received: 05/29/19 9:26 PM  (use smartphrase ".now" to insert current time)  Test: lactic acid Critical Value: >11.0  Name of Provider Notified: Percell Miller  Orders Received? Or Actions Taken?:.[name]

## 2019-05-29 NOTE — ED Notes (Signed)
Pt pulled off leads.

## 2019-05-29 NOTE — ED Provider Notes (Signed)
[facility] Provider Note   CSN: [id number] Arrival date & time: 05/29/19  1635    History   Chief Complaint Chief Complaint  Patient presents with  . Suicidal    HPI [name] is a 58 y.o. male.     58yo male brought in by EMS with report of drinking 10oz of antifreeze just prior to arrival today in attempt to kill himself. EMS estimates 10oz of the antifreeze missing from the bottle, possibly mixed with Sun Drop soda. Reports abdominal pain, states he has consumed antifreeze in the past and forgot how much it hurts his stomach. Denies drinking any alcohol, reports taking his neurontin today, denies other medications or illicit drug use.      Past Medical History:  Diagnosis Date  . Alcohol dependence ([place]) 1979   stated abusing ETOH at age 31   . Back pain   . Benzodiazepine dependence ([place])   . Cardiac arrest ([place])   . Chronic back pain   . Cocaine abuse ([place])   . COPD (chronic obstructive pulmonary disease) ([place])   . Diabetes mellitus without complication ([place]) 3716  . Hypertension 2008  . MI (myocardial infarction) ([place])   . Non-compliance   . Schizoaffective disorder ([place]) 2006   . Substance abuse [facility])     Patient Active Problem List   Diagnosis Date Noted  . Overdose 04/14/2018  . MDD (major depressive disorder) 11/12/2017  . 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 Diabetic neuropathy ([place]) 11/11/2016  . Anxiety and depression 11/11/2016  . Chronic insomnia 01/08/2016  . Alcohol use disorder 12/10/2015  . Benzodiazepine abuse ([place]) 12/10/2015  . Opioid abuse ([place]) 12/10/2015  . Constipation 02/06/2015  . BPH (benign prostatic hyperplasia) 01/16/2015  . Vitamin D deficiency 01/16/2015  . Allergic rhinitis 11/19/2014  . Chronic low back pain 11/07/2014  . Onychomycosis of toenail 11/07/2014  . Tinea pedis 11/07/2014  . Psoriasis 10/14/2014  . HTN (hypertension) 10/14/2014  .  Diabetes mellitus type 2, controlled ([place]) 10/14/2014  . COPD (chronic obstructive pulmonary disease) ([place]) 10/14/2014  . Tobacco abuse 10/14/2014  . HLD (hyperlipidemia) 10/14/2014  . Schizoaffective disorder, depressive type ([place]) 10/14/2014    Past Surgical History:  Procedure Laterality Date  . CYST EXCISION          Home Medications    Prior to Admission medications   Medication Sig Start Date End Date Taking? Authorizing Provider  albuterol (PROVENTIL HFA;VENTOLIN HFA) 108 (90 Base) MCG/ACT inhaler Inhale 2 puffs into the lungs every 6 (six) hours as needed for wheezing or shortness of breath. 11/15/17   Lindell Spar I, NP  aspirin EC 81 MG tablet Take 81 mg by mouth daily.    [provider]  atorvastatin (LIPITOR) 40 MG tablet TAKE 1 TABLET BY MOUTH EVERY DAY: For high Cholesterol Patient taking differently: Take 40 mg by mouth daily at 6 PM.  11/15/17   [place], Loleta Dicker, NP  baclofen (LIORESAL) 10 MG tablet Take 10 mg by mouth 3 (three) times daily.    [provider]  budesonide-formoterol (SYMBICORT) 80-4.5 MCG/ACT inhaler Inhale 2 puffs into the lungs 2 (two) times daily.    [provider]  gabapentin (NEURONTIN) 800 MG tablet Take 800 mg by mouth 4 (four) times daily. 12/08/17   [provider]  hydrOXYzine (ATARAX/VISTARIL) 10 MG tablet Take 10 mg by mouth 3 (three) times daily.    [provider]  ibuprofen (ADVIL,MOTRIN)  800 MG tablet Take 1 tablet (800 mg total) by mouth every 6 (six) hours as needed (moderate to severe pain). 11/15/17   Lindell Spar I, NP  insulin aspart (NOVOLOG) 100 UNIT/ML injection Inject 1-30 Units into the skin 3 (three) times daily with meals. SLIDING SCALE 02/12/19   [provider]  isosorbide mononitrate (IMDUR) 30 MG 24 hr tablet Take 30 mg by mouth daily.    [provider]  LEVEMIR 100 UNIT/ML injection Inject 20 Units into the skin at bedtime.  04/26/19   [provider]   lisinopril (PRINIVIL,ZESTRIL) 10 MG tablet Take 1 tablet by mouth daily. 12/12/17   [provider]  metFORMIN (GLUCOPHAGE-XR) 500 MG 24 hr tablet Take 1 tablet (500 mg total) by mouth 2 (two) times daily with a meal. For diabetes management Patient taking differently: Take 1,000 mg by mouth 2 (two) times daily with a meal. For diabetes management 11/15/17   Lindell Spar I, NP  metoprolol succinate (TOPROL-XL) 25 MG 24 hr tablet Take 50 mg by mouth daily.    [provider]  nitroGLYCERIN (NITROSTAT) 0.4 MG SL tablet Place 0.4 mg under the tongue every 5 (five) minutes as needed for chest pain.    [provider]  omeprazole (PRILOSEC) 20 MG capsule Take 20 mg by mouth daily.    [provider]  paliperidone (INVEGA SUSTENNA) 234 MG/1.5ML SUSP injection Inject 234 mg into the muscle every 30 (thirty) days. (Due on 11-14-17): For mood control 11/15/17   Encarnacion Slates, NP    Family History Family History  Problem Relation Age of Onset  . Heart disease Mother   . Heart disease Father   . Heart disease Maternal Grandmother   . Diabetes Maternal Grandmother     Social History Social History   Tobacco Use  . Smoking status: Current Every Day Smoker    Packs/day: 1.50    Types: Cigarettes  . Smokeless tobacco: Never Used  Substance Use Topics  . Alcohol use: Yes    Alcohol/week: 0.0 standard drinks    Comment: last drank week ago  . Drug use: Not Currently    Types: Cocaine    Comment: Denies any use in past 6 months     Allergies   Quetiapine, Seroquel [quetiapine fumarate], Trazodone, Trazodone and nefazodone, Vistaril [hydroxyzine hcl], and Haldol [haloperidol lactate]   Review of Systems Review of Systems  Constitutional: Negative for fever.  Respiratory: Negative for shortness of breath.   Cardiovascular: Negative for chest pain.  Gastrointestinal: Positive for abdominal pain. Negative for constipation, diarrhea, nausea and vomiting.   Skin: Negative for wound.  Allergic/Immunologic: Positive for immunocompromised state.  Psychiatric/Behavioral: Positive for suicidal ideas. Negative for confusion.  All other systems reviewed and are negative.    Physical Exam Updated Vital Signs BP (!) 165/80   Pulse 67   Temp 98.3 F (36.8 C) (Oral)   Resp 20   Ht 6' (1.829 m)   Wt 99.6 kg   SpO2 100%   BMI 29.78 kg/m   Physical Exam Vitals signs and nursing note reviewed.  Constitutional:      General: He is not in acute distress.    Appearance: He is well-developed. He is not diaphoretic.  HENT:     Head: Normocephalic and atraumatic.     Mouth/Throat:     Mouth: Mucous membranes are moist.  Eyes:     Pupils: Pupils are equal, round, and reactive to light.  Cardiovascular:  Rate and Rhythm: Normal rate and regular rhythm.     Heart sounds: Normal heart sounds.  Pulmonary:     Effort: Pulmonary effort is normal.     Breath sounds: Normal breath sounds.  Abdominal:     Palpations: Abdomen is soft.     Tenderness: There is no abdominal tenderness.  Musculoskeletal:     Right lower leg: No edema.     Left lower leg: No edema.  Skin:    General: Skin is warm and dry.     Findings: Rash present.     Comments: Psoriasis   Neurological:     Mental Status: He is alert and oriented to person, place, and time.  Psychiatric:        Behavior: Behavior normal.      ED Treatments / Results  Labs (all labs ordered are listed, but only abnormal results are displayed) Labs Reviewed  COMPREHENSIVE METABOLIC PANEL - Abnormal; Notable for the following components:      Result Value   Sodium 127 (*)    Chloride 97 (*)    CO2 16 (*)    Glucose, Bld 277 (*)    Creatinine, Ser 1.29 (*)    Calcium 8.7 (*)    All other components within normal limits  LACTIC ACID, PLASMA - Abnormal; Notable for the following components:   Lactic Acid, Venous >11.0 (*)    All other components within normal limits  LACTIC ACID,  PLASMA - Abnormal; Notable for the following components:   Lactic Acid, Venous >11.0 (*)    All other components within normal limits  ACETAMINOPHEN LEVEL - Abnormal; Notable for the following components:   Acetaminophen (Tylenol), Serum <10 (*)    All other components within normal limits  URINALYSIS, ROUTINE W REFLEX MICROSCOPIC - Abnormal; Notable for the following components:   Color, Urine STRAW (*)    Specific Gravity, Urine 1.003 (*)    All other components within normal limits  BLOOD GAS, ARTERIAL - Abnormal; Notable for the following components:   pH, Arterial 7.349 (*)    pO2, Arterial 44.7 (*)    Bicarbonate 19.7 (*)    Acid-base deficit 5.6 (*)    All other components within normal limits  BASIC METABOLIC PANEL - Abnormal; Notable for the following components:   Sodium 131 (*)    CO2 17 (*)    Glucose, Bld 216 (*)    Creatinine, Ser 1.34 (*)    Calcium 8.6 (*)    GFR calc non Af Amer 58 (*)    All other components within normal limits  SARS CORONAVIRUS 2  OSMOLALITY  SALICYLATE LEVEL  MAGNESIUM  PHOSPHORUS  RAPID URINE DRUG SCREEN, HOSP PERFORMED  ETHANOL  VOLATILES,BLD-ACETONE,ETHANOL,ISOPROP,METHANOL  ETHYLENE GLYCOL  VITAMIN B1  VITAMIN B6  LACTIC ACID, PLASMA  LACTIC ACID, PLASMA    EKG EKG Interpretation  Date/Time:  Tuesday May 29 2019 16:43:32 EDT Ventricular Rate:  96 PR Interval:  172 QRS Duration: 96 QT Interval:  370 QTC Calculation: 467 R Axis:   -55 Text Interpretation:  Normal sinus rhythm Left axis deviation Inferior infarct , age undetermined Abnormal ECG Confirmed by Virgel Manifold [id number]) on 05/29/2019 6:58:42 PM   Radiology No results found.  Procedures .Critical Care Performed by: Tacy Learn, PA-C Authorized by: Tacy Learn, PA-C   Critical care provider statement:    Critical care time (minutes):  45   Critical care was time spent personally by me on the following activities:  Discussions with consultants,  evaluation of patient's response to treatment, examination of patient, ordering and performing treatments and interventions, ordering and review of laboratory studies, ordering and review of radiographic studies, pulse oximetry, re-evaluation of patient's condition, obtaining history from patient or surrogate and review of old charts   (including critical care time)  Medications Ordered in ED Medications  pyridOXINE (B-6) injection 100 mg (100 mg Intravenous Given 05/29/19 1833)  sodium chloride 0.9 % bolus 1,000 mL (1,000 mLs Intravenous Refused 05/29/19 2101)  thiamine (B-1) injection 100 mg (100 mg Intravenous Given 05/29/19 1837)  fomepizole (ANTIZOL) 1,490 mg in sodium chloride 0.9 % 100 mL IVPB (0 mg Intravenous Stopped 05/29/19 1906)  sodium chloride 0.9 % bolus 1,000 mL (0 mLs Intravenous Stopped 05/29/19 1906)     Initial Impression / Assessment and Plan / ED Course  I have reviewed the triage vital signs and the nursing notes.  Pertinent labs & imaging results that were available during my care of the patient were reviewed by me and considered in my medical decision making (see chart for details).  Clinical Course as of May 28 2306  Tue May 29, 2019  2012 58yo male brought in by EMS after ingestion of antifreeze in attempt of suicide. Patient states this is not the first time he has done this. Airway clear, respirations even and unlabored, abdomen soft and non tender. RN called poison control on arrival and was given instructions for Fomepizole dosing, labs ordered per poison control, given thiamin and pyroxamine. Patient given first dose of Fomepizole at 15mg /kg.  Labs return with initial lactic acid of >11.0, patient given IV fluids. ABG with pO2 44.7, placed on [place], pH 7.349, bicarb 19.7, acid base deficit 5.6. salicylate and apap levels negative. Magnesium WNL. CMP with metabolic acidosis Co2 16, gap WNL. Cr 1.29, Na 127.  Additional labs pending, repeat BMP ordered.  Call to poison  control for update placed, awaiting return call.   [LM]  2141 Case discussed with hospitalist, requests critical care consult for lactic acid >11. Repeat lactic acid, remains >11, repeat BMP, Cr 1.34, Co2 17, Na 131. Critical care paged for consult. Patient remains awake, alert, requesting ativan and narcotic pain medication.    [LM]  2256 Case discussed with Dr. Emmit Alexanders with critical care, requests call back with ETA for ethylene glycol results and repeat lactic acid.  Calculated serum osm 273; osm gap 14.0 Monitor mental status, in changes, may need to intubate and start fomepizole drip. Discussed plan with patient's nurse. Care signed out to Dr. Tomi Bamberger, pending repeat lactic acid, ethylene glycol ETA, call back to Dr. Emmit Alexanders.   [LM]    Clinical Course User Index [LM] Tacy Learn, PA-C      Final Clinical Impressions(s) / ED Diagnoses   Final diagnoses:  Poisoning by antifreeze, intentional self-harm, initial encounter [facility])    ED Discharge Orders    None       Roque Lias 05/29/19 2307    Virgel Manifold, MD 05/31/19 [phone]

## 2019-05-29 NOTE — ED Notes (Signed)
Pt on phone 

## 2019-05-29 NOTE — ED Notes (Signed)
RN tried to explained to pt that I needed to stick pt to give meds.  Pt states" I need my hernia checked and need to be transferred to [facility]." pt will not sit down in bed to allow RN to stick pt

## 2019-05-29 NOTE — ED Notes (Signed)
Poison Control contacted at this time and recommendations given to the EDP about medications and labs recommended.

## 2019-05-29 NOTE — ED Notes (Signed)
Updated poison control with pt's lab values, ekg and vital signs.

## 2019-05-29 NOTE — ED Notes (Addendum)
Patients belongings consist of 2 packs of cigs, sweat pants, tshirt, and a phone. Placed in EMS locker room.

## 2019-05-29 NOTE — ED Notes (Signed)
Pt still refuses to be hooked up to monitor and bp.

## 2019-05-29 NOTE — ED Notes (Signed)
Patients wallet given to security at this time consisting of patients debit card, $1.04 in change, birth certificate, and appointment cards.

## 2019-05-29 NOTE — ED Notes (Signed)
CRITICAL VALUE ALERT  Critical Value:  Lactic Acid >11  Date & Time Notied:  05/29/2019, 1815  Provider Notified: Dr. Wilson Singer  Orders Received/Actions taken: no new orders

## 2019-05-29 NOTE — ED Triage Notes (Signed)
Pt drank 10oz of Antifreeze trying to kill himself. Ambulatory to triage. States his stomach is hurting him. Alert and oriented.

## 2019-05-30 DIAGNOSIS — D899 Disorder involving the immune mechanism, unspecified: Secondary | ICD-10-CM | POA: Diagnosis not present

## 2019-05-30 DIAGNOSIS — Z20828 Contact with and (suspected) exposure to other viral communicable diseases: Secondary | ICD-10-CM | POA: Diagnosis present

## 2019-05-30 DIAGNOSIS — M549 Dorsalgia, unspecified: Secondary | ICD-10-CM | POA: Diagnosis not present

## 2019-05-30 DIAGNOSIS — F5104 Psychophysiologic insomnia: Secondary | ICD-10-CM | POA: Diagnosis not present

## 2019-05-30 DIAGNOSIS — N179 Acute kidney failure, unspecified: Secondary | ICD-10-CM | POA: Diagnosis not present

## 2019-05-30 DIAGNOSIS — F419 Anxiety disorder, unspecified: Secondary | ICD-10-CM

## 2019-05-30 DIAGNOSIS — F259 Schizoaffective disorder, unspecified: Secondary | ICD-10-CM | POA: Diagnosis present

## 2019-05-30 DIAGNOSIS — F1320 Sedative, hypnotic or anxiolytic dependence, uncomplicated: Secondary | ICD-10-CM | POA: Diagnosis present

## 2019-05-30 DIAGNOSIS — R21 Rash and other nonspecific skin eruption: Secondary | ICD-10-CM | POA: Diagnosis present

## 2019-05-30 DIAGNOSIS — T50902D Poisoning by unspecified drugs, medicaments and biological substances, intentional self-harm, subsequent encounter: Secondary | ICD-10-CM | POA: Diagnosis not present

## 2019-05-30 DIAGNOSIS — T1491XA Suicide attempt, initial encounter: Secondary | ICD-10-CM

## 2019-05-30 DIAGNOSIS — Z7982 Long term (current) use of aspirin: Secondary | ICD-10-CM | POA: Diagnosis not present

## 2019-05-30 DIAGNOSIS — Z794 Long term (current) use of insulin: Secondary | ICD-10-CM | POA: Diagnosis not present

## 2019-05-30 DIAGNOSIS — F329 Major depressive disorder, single episode, unspecified: Secondary | ICD-10-CM | POA: Diagnosis present

## 2019-05-30 DIAGNOSIS — Z79899 Other long term (current) drug therapy: Secondary | ICD-10-CM | POA: Diagnosis not present

## 2019-05-30 DIAGNOSIS — T6594XA Toxic effect of unspecified substance, undetermined, initial encounter: Secondary | ICD-10-CM | POA: Diagnosis not present

## 2019-05-30 DIAGNOSIS — E785 Hyperlipidemia, unspecified: Secondary | ICD-10-CM | POA: Diagnosis not present

## 2019-05-30 DIAGNOSIS — Z833 Family history of diabetes mellitus: Secondary | ICD-10-CM | POA: Diagnosis not present

## 2019-05-30 DIAGNOSIS — J449 Chronic obstructive pulmonary disease, unspecified: Secondary | ICD-10-CM | POA: Diagnosis present

## 2019-05-30 DIAGNOSIS — T6592XA Toxic effect of unspecified substance, intentional self-harm, initial encounter: Secondary | ICD-10-CM | POA: Diagnosis not present

## 2019-05-30 DIAGNOSIS — E1169 Type 2 diabetes mellitus with other specified complication: Secondary | ICD-10-CM | POA: Diagnosis not present

## 2019-05-30 DIAGNOSIS — F209 Schizophrenia, unspecified: Secondary | ICD-10-CM

## 2019-05-30 DIAGNOSIS — N400 Enlarged prostate without lower urinary tract symptoms: Secondary | ICD-10-CM | POA: Diagnosis not present

## 2019-05-30 DIAGNOSIS — Z7951 Long term (current) use of inhaled steroids: Secondary | ICD-10-CM | POA: Diagnosis not present

## 2019-05-30 DIAGNOSIS — I10 Essential (primary) hypertension: Secondary | ICD-10-CM | POA: Diagnosis not present

## 2019-05-30 DIAGNOSIS — F1020 Alcohol dependence, uncomplicated: Secondary | ICD-10-CM | POA: Diagnosis present

## 2019-05-30 DIAGNOSIS — E119 Type 2 diabetes mellitus without complications: Secondary | ICD-10-CM | POA: Diagnosis present

## 2019-05-30 DIAGNOSIS — T511X2A Toxic effect of methanol, intentional self-harm, initial encounter: Secondary | ICD-10-CM | POA: Diagnosis not present

## 2019-05-30 DIAGNOSIS — Z03818 Encounter for observation for suspected exposure to other biological agents ruled out: Secondary | ICD-10-CM | POA: Diagnosis not present

## 2019-05-30 DIAGNOSIS — F17210 Nicotine dependence, cigarettes, uncomplicated: Secondary | ICD-10-CM | POA: Diagnosis present

## 2019-05-30 DIAGNOSIS — I252 Old myocardial infarction: Secondary | ICD-10-CM | POA: Diagnosis not present

## 2019-05-30 DIAGNOSIS — T518X2A Toxic effect of other alcohols, intentional self-harm, initial encounter: Secondary | ICD-10-CM | POA: Diagnosis not present

## 2019-05-30 LAB — CREATININE, SERUM
Creatinine, Ser: 1.42 mg/dL — ABNORMAL HIGH (ref 0.61–1.24)
GFR calc Af Amer: >60 mL/min (ref >60)
GFR calc non Af Amer: 54 mL/min — ABNORMAL LOW (ref >60)

## 2019-05-30 LAB — SARS CORONAVIRUS 2 (TAT 6-24 HRS): SARS Coronavirus 2: NEGATIVE

## 2019-05-30 LAB — CBC
HCT: 41.3 % (ref 39.0–52.0)
HCT: 39.2 % (ref 39.0–52.0)
Hemoglobin: 14.5 g/dL (ref 13.0–17.0)
Hemoglobin: 13.9 g/dL (ref 13.0–17.0)
MCH: 30.7 pg (ref 26.0–34.0)
MCH: 30.8 pg (ref 26.0–34.0)
MCHC: 35.1 g/dL (ref 30.0–36.0)
MCHC: 35.5 g/dL (ref 30.0–36.0)
MCV: 87.5 fL (ref 80.0–100.0)
MCV: 86.7 fL (ref 80.0–100.0)
Platelets: 137 10*3/uL — ABNORMAL LOW (ref 150–400)
Platelets: 140 10*3/uL — ABNORMAL LOW (ref 150–400)
RBC: 4.72 MIL/uL (ref 4.22–5.81)
RBC: 4.52 MIL/uL (ref 4.22–5.81)
RDW: 14.5 % (ref 11.5–15.5)
RDW: 14.4 % (ref 11.5–15.5)
WBC: 5.8 10*3/uL (ref 4.0–10.5)
WBC: 4.4 10*3/uL (ref 4.0–10.5)
nRBC: 0 % (ref 0.0–0.2)
nRBC: 0 % (ref 0.0–0.2)

## 2019-05-30 LAB — SARS CORONAVIRUS 2 BY RT PCR (HOSPITAL ORDER, PERFORMED IN ~~LOC~~ HOSPITAL LAB): SARS Coronavirus 2: NEGATIVE

## 2019-05-30 LAB — BASIC METABOLIC PANEL
Anion gap: 10 (ref 5–15)
BUN: 10 mg/dL (ref 6–20)
CO2: 19 mmol/L — ABNORMAL LOW (ref 22–32)
Calcium: 9.2 mg/dL (ref 8.9–10.3)
Chloride: 106 mmol/L (ref 98–111)
Creatinine, Ser: 1.27 mg/dL — ABNORMAL HIGH (ref 0.61–1.24)
GFR calc Af Amer: >60 mL/min (ref >60)
GFR calc non Af Amer: >60 mL/min (ref >60)
Glucose, Bld: 203 mg/dL — ABNORMAL HIGH (ref 70–99)
Potassium: 4.2 mmol/L (ref 3.5–5.1)
Sodium: 135 mmol/L (ref 135–145)

## 2019-05-30 LAB — POCT I-STAT 7, (LYTES, BLD GAS, ICA,H+H)
Acid-base deficit: 5 mmol/L — ABNORMAL HIGH (ref 0.0–2.0)
Bicarbonate: 18.1 mmol/L — ABNORMAL LOW (ref 20.0–28.0)
Calcium, Ion: 1.27 mmol/L (ref 1.15–1.40)
HCT: 42 % (ref 39.0–52.0)
Hemoglobin: 14.3 g/dL (ref 13.0–17.0)
O2 Saturation: 97 %
Patient temperature: 98.6
Potassium: 4 mmol/L (ref 3.5–5.1)
Sodium: 135 mmol/L (ref 135–145)
TCO2: 19 mmol/L — ABNORMAL LOW (ref 22–32)
pCO2 arterial: 29.2 mmHg — ABNORMAL LOW (ref 32.0–48.0)
pH, Arterial: 7.401 (ref 7.350–7.450)
pO2, Arterial: 91 mmHg (ref 83.0–108.0)

## 2019-05-30 LAB — LACTIC ACID, PLASMA
Lactic Acid, Venous: 2.7 mmol/L (ref 0.5–1.9)
Lactic Acid, Venous: 1.8 mmol/L (ref 0.5–1.9)

## 2019-05-30 LAB — GLUCOSE, CAPILLARY
Glucose-Capillary: 253 mg/dL — ABNORMAL HIGH (ref 70–99)
Glucose-Capillary: 186 mg/dL — ABNORMAL HIGH (ref 70–99)
Glucose-Capillary: 190 mg/dL — ABNORMAL HIGH (ref 70–99)
Glucose-Capillary: 213 mg/dL — ABNORMAL HIGH (ref 70–99)
Glucose-Capillary: 217 mg/dL — ABNORMAL HIGH (ref 70–99)
Glucose-Capillary: 232 mg/dL — ABNORMAL HIGH (ref 70–99)

## 2019-05-30 LAB — VOLATILES,BLD-ACETONE,ETHANOL,ISOPROP,METHANOL
Acetone, blood: NEGATIVE % (ref 0.000–0.010)
Ethanol, blood: NEGATIVE % (ref 0.000–0.010)
Isopropanol, blood: NEGATIVE % (ref 0.000–0.010)
Methanol, blood: NEGATIVE % (ref 0.000–0.010)

## 2019-05-30 LAB — MAGNESIUM: Magnesium: 1.8 mg/dL (ref 1.7–2.4)

## 2019-05-30 LAB — PHOSPHORUS: Phosphorus: 3.5 mg/dL (ref 2.5–4.6)

## 2019-05-30 LAB — MRSA PCR SCREENING: MRSA by PCR: NEGATIVE

## 2019-05-30 LAB — ETHYLENE GLYCOL: Ethylene Glycol Lvl: 35 mg/dL — ABNORMAL HIGH

## 2019-05-30 MED ORDER — OXYCODONE-ACETAMINOPHEN 5-325 MG PO TABS
1.0000 | ORAL_TABLET | ORAL | Status: DC | PRN
  Administered 2019-05-30 – 2019-05-31 (×4): 2 via ORAL
  Filled 2019-05-30 (×4): qty 2

## 2019-05-30 MED ORDER — ENOXAPARIN SODIUM 40 MG/0.4ML ~~LOC~~ SOLN
40.0000 mg | SUBCUTANEOUS | Status: DC
  Administered 2019-05-30 – 2019-06-07 (×8): 40 mg via SUBCUTANEOUS
  Filled 2019-05-30 (×9): qty 0.4

## 2019-05-30 MED ORDER — LORAZEPAM 2 MG/ML IJ SOLN
1.0000 mg | INTRAMUSCULAR | Status: DC | PRN
  Administered 2019-05-30 – 2019-05-31 (×6): 2 mg via INTRAVENOUS
  Filled 2019-05-30 (×7): qty 1

## 2019-05-30 MED ORDER — ASPIRIN 81 MG PO CHEW
81.0000 mg | CHEWABLE_TABLET | Freq: Every day | ORAL | Status: DC
  Administered 2019-05-30 – 2019-06-07 (×8): 81 mg via ORAL
  Filled 2019-05-30 (×9): qty 1

## 2019-05-30 MED ORDER — ISOSORBIDE MONONITRATE ER 30 MG PO TB24
30.0000 mg | ORAL_TABLET | Freq: Every day | ORAL | Status: DC
  Administered 2019-05-30 – 2019-06-07 (×9): 30 mg via ORAL
  Filled 2019-05-30 (×9): qty 1

## 2019-05-30 MED ORDER — SODIUM CHLORIDE 0.9 % IV SOLN
10.0000 mg/kg | Freq: Two times a day (BID) | INTRAVENOUS | Status: DC
  Administered 2019-05-30: 880 mg via INTRAVENOUS
  Filled 2019-05-30 (×2): qty 0.88

## 2019-05-30 MED ORDER — ATORVASTATIN CALCIUM 40 MG PO TABS
40.0000 mg | ORAL_TABLET | Freq: Every day | ORAL | Status: DC
  Administered 2019-05-30 – 2019-06-06 (×7): 40 mg via ORAL
  Filled 2019-05-30 (×7): qty 1

## 2019-05-30 MED ORDER — THIAMINE HCL 100 MG/ML IJ SOLN
100.0000 mg | Freq: Every day | INTRAMUSCULAR | Status: DC
  Administered 2019-05-30: 11:00:00 100 mg via INTRAVENOUS
  Filled 2019-05-30: qty 2

## 2019-05-30 MED ORDER — HYDROXYZINE HCL 10 MG PO TABS
10.0000 mg | ORAL_TABLET | Freq: Three times a day (TID) | ORAL | Status: DC
  Administered 2019-05-30 – 2019-06-06 (×15): 10 mg via ORAL
  Filled 2019-05-30 (×25): qty 1

## 2019-05-30 MED ORDER — GABAPENTIN 400 MG PO CAPS
800.0000 mg | ORAL_CAPSULE | Freq: Three times a day (TID) | ORAL | Status: DC
  Administered 2019-05-30 – 2019-06-07 (×25): 800 mg via ORAL
  Filled 2019-05-30 (×26): qty 2

## 2019-05-30 MED ORDER — SODIUM BICARBONATE 8.4 % IV SOLN
50.0000 meq | Freq: Once | INTRAVENOUS | Status: DC
  Filled 2019-05-30: qty 50

## 2019-05-30 MED ORDER — INSULIN DETEMIR 100 UNIT/ML ~~LOC~~ SOLN
10.0000 [IU] | Freq: Every day | SUBCUTANEOUS | Status: DC
  Administered 2019-05-30 – 2019-06-01 (×3): 10 [IU] via SUBCUTANEOUS
  Filled 2019-05-30 (×3): qty 0.1

## 2019-05-30 MED ORDER — NICOTINE 21 MG/24HR TD PT24
21.0000 mg | MEDICATED_PATCH | Freq: Every day | TRANSDERMAL | Status: DC
  Administered 2019-05-30 – 2019-06-06 (×9): 21 mg via TRANSDERMAL
  Filled 2019-05-30 (×9): qty 1

## 2019-05-30 MED ORDER — SODIUM CHLORIDE 0.9 % IV SOLN
10.0000 mg/kg | Freq: Two times a day (BID) | INTRAVENOUS | Status: DC
  Administered 2019-05-30: 22:00:00 880 mg via INTRAVENOUS
  Filled 2019-05-30 (×3): qty 0.88

## 2019-05-30 MED ORDER — FOLIC ACID 5 MG/ML IJ SOLN
1.0000 mg | Freq: Every day | INTRAMUSCULAR | Status: DC
  Administered 2019-05-30: 1 mg via INTRAVENOUS
  Filled 2019-05-30 (×2): qty 0.2

## 2019-05-30 MED ORDER — METOPROLOL SUCCINATE ER 50 MG PO TB24
50.0000 mg | ORAL_TABLET | Freq: Every day | ORAL | Status: DC
  Administered 2019-05-30 – 2019-06-07 (×9): 50 mg via ORAL
  Filled 2019-05-30 (×9): qty 1

## 2019-05-30 MED ORDER — ASENAPINE MALEATE 5 MG SL SUBL
5.0000 mg | SUBLINGUAL_TABLET | Freq: Two times a day (BID) | SUBLINGUAL | Status: DC
  Administered 2019-05-30 – 2019-06-04 (×6): 5 mg via SUBLINGUAL
  Filled 2019-05-30 (×20): qty 1

## 2019-05-30 MED ORDER — HYDROXYZINE HCL 10 MG PO TABS
10.0000 mg | ORAL_TABLET | Freq: Three times a day (TID) | ORAL | Status: DC
  Filled 2019-05-30: qty 1

## 2019-05-30 MED ORDER — LACTATED RINGERS IV SOLN
INTRAVENOUS | Status: DC
  Administered 2019-05-30 – 2019-06-02 (×8): via INTRAVENOUS

## 2019-05-30 MED ORDER — LORAZEPAM 2 MG/ML IJ SOLN: 1.0000 mg | INTRAMUSCULAR | Status: DC | PRN

## 2019-05-30 MED ORDER — INSULIN ASPART 100 UNIT/ML ~~LOC~~ SOLN
0.0000 [IU] | SUBCUTANEOUS | Status: DC
  Administered 2019-05-30: 5 [IU] via SUBCUTANEOUS
  Administered 2019-05-30: 3 [IU] via SUBCUTANEOUS
  Administered 2019-05-30: 5 [IU] via SUBCUTANEOUS
  Administered 2019-05-30: 8 [IU] via SUBCUTANEOUS
  Administered 2019-05-30: 12:00:00 3 [IU] via SUBCUTANEOUS
  Administered 2019-05-30 – 2019-05-31 (×2): 5 [IU] via SUBCUTANEOUS
  Administered 2019-05-31: 3 [IU] via SUBCUTANEOUS
  Administered 2019-05-31 (×3): 5 [IU] via SUBCUTANEOUS
  Administered 2019-06-01 (×2): 3 [IU] via SUBCUTANEOUS
  Administered 2019-06-01: 5 [IU] via SUBCUTANEOUS
  Administered 2019-06-01: 3 [IU] via SUBCUTANEOUS
  Administered 2019-06-01: 5 [IU] via SUBCUTANEOUS
  Administered 2019-06-01 – 2019-06-02 (×2): 3 [IU] via SUBCUTANEOUS
  Administered 2019-06-02: 8 [IU] via SUBCUTANEOUS
  Administered 2019-06-02: 2 [IU] via SUBCUTANEOUS
  Administered 2019-06-02: 3 [IU] via SUBCUTANEOUS
  Administered 2019-06-02: 5 [IU] via SUBCUTANEOUS
  Administered 2019-06-02 – 2019-06-03 (×2): 3 [IU] via SUBCUTANEOUS
  Administered 2019-06-03 (×2): 5 [IU] via SUBCUTANEOUS
  Administered 2019-06-03: 2 [IU] via SUBCUTANEOUS
  Administered 2019-06-03: 5 [IU] via SUBCUTANEOUS

## 2019-05-30 MED ORDER — SENNOSIDES-DOCUSATE SODIUM 8.6-50 MG PO TABS
1.0000 | ORAL_TABLET | Freq: Every day | ORAL | Status: DC
  Administered 2019-05-30 – 2019-06-01 (×2): 1 via ORAL
  Filled 2019-05-30 (×2): qty 1

## 2019-05-30 MED ORDER — GABAPENTIN 800 MG PO TABS
800.0000 mg | ORAL_TABLET | Freq: Three times a day (TID) | ORAL | Status: DC
  Filled 2019-05-30 (×2): qty 1

## 2019-05-30 MED ORDER — ALBUTEROL SULFATE (2.5 MG/3ML) 0.083% IN NEBU: 2.5000 mg | INHALATION_SOLUTION | RESPIRATORY_TRACT | Status: DC | PRN

## 2019-05-30 MED ORDER — MOMETASONE FURO-FORMOTEROL FUM 200-5 MCG/ACT IN AERO
2.0000 | INHALATION_SPRAY | Freq: Two times a day (BID) | RESPIRATORY_TRACT | Status: DC
  Administered 2019-05-30 – 2019-06-03 (×6): 2 via RESPIRATORY_TRACT
  Filled 2019-05-30: qty 8.8

## 2019-05-30 MED ORDER — SODIUM CHLORIDE 0.9 % IV SOLN: INTRAVENOUS | Status: DC

## 2019-05-30 NOTE — Progress Notes (Signed)
Appreciate psychiatry input Recommended for inpatient psychiatry hospitalization  We will start on Saphris 5 twice daily EKG in a.m.  Start on normal saline at 100 cc an hour

## 2019-05-30 NOTE — Progress Notes (Signed)
Assisted tele visit to patient with provider, Dr Mariea Clonts (Psychiatry).  Maryelizabeth Rowan, RN

## 2019-05-30 NOTE — ED Notes (Signed)
Update to poison control, no new recommendations

## 2019-05-30 NOTE — ED Provider Notes (Signed)
Patient left at change of shift to check repeat lactic acid level and talk to Dr. Madalyn Rob, intensivist, again.  Patient is resting, when I go in the room he is easily awakened and starts complaining about the pain in his knees.  He is in no respiratory distress.  His color is ruddy.  Patient's repeat lactic acid is still greater than 11.  12:12 AM Dr. Emmit Alexanders, intensivist, accepts in transfer to [facility], ICU floor 74M with Dr. Chase Caller as attending.  Bed request done.   Rolland Porter, MD 05/30/19 [phone]

## 2019-05-30 NOTE — Progress Notes (Signed)
NAME:  [name], MRN:  [id number], DOB:  [birth year], LOS: 0 ADMISSION DATE:  05/29/2019, CONSULTATION DATE:  05/30/2019 REFERRING MD:  ED physician, CHIEF COMPLAINT: Ethylene glycol ingestion  Brief History   58 year old gentleman transferred from [place] pain after drinking roughly 10 ounces of antifreeze in a suicide attempt.  History of present illness   [name] is a 58 y.o. male who has a PMH including but not limited to Substance abuse, shizoaffective disorder, MI, HTN, cardiac arrest, COPD, DM (see "past medical history" for rest).  He presented to [facility] ED 8/11 after drinking roughly 10oz of antifreeze as part of a suicide attempt.  He called EMS because his stomach hurt and states that he has done the same in the past and forgot how much his stomach hurt afterwards.  In ED, he received fomepizole (loading dose - 15 mg/kg), thiamine, pyridoxine.  He was then transferred to [facility] for further evaluation and management.    UDS negative. Ethylene glycol -pending. Osmoles 287, osmolar gap 9, SCr 1.34, ABG  7.35 / 35 / 45 / 5.6. ABG 8/12- 7.40/ 29/ 91  Past Medical History  History of substance abuse, schizoaffective disorder, MI, hypertension, cardiac arrest, COPD, diabetes.  Significant Hospital Events   Admitted 8/11  Consults:  Psychiatry consult  Procedures:    Significant Diagnostic Tests:  Ethylene glycol level pending ABG-not acidemic  Micro Data:  SARS CoV2-negative  Antimicrobials:  None  Interim history/subjective:  Has no complaints States he has a lot of pain and discomfort, degenerative back disease-not able to get his pain and discomfort on the control is why he drank ethylene glycol  Objective   Blood pressure (!) 178/101, pulse (!) 57, temperature 97.6 F (36.4 C), temperature source Oral, resp. rate 14, height 6' (1.829 m), weight 88.4 kg, SpO2 97 %.        Intake/Output Summary (Last 24 hours) at 05/30/2019 0754 Last data filed at 05/30/2019  0600 Gross per 24 hour  Intake 0.02 ml  Output 450 ml  Net -449.98 ml   Filed Weights   05/29/19 1655 05/30/19 0355  Weight: 99.6 kg 88.4 kg    Examination: General: Does not appear to be in distress HENT: Alert and oriented x3, denies any blurring of vision, no visual symptoms Lungs: Clear breath sounds Cardiovascular: S1-S2 appreciated Abdomen: Soft, nontender Extremities: No clubbing, no edema Neuro: Alert and oriented x3, moving all extremities GU:   Resolved Hospital Problem list     Assessment & Plan:  Ethylene glycol ingestion-suicide attempt Patient took him roughly 10 ounces Ethylene glycol levels pending ABGs does not reveal any acidemia -Fomepizole -Follow ethylene glycol levels -Thiamine/folate/pyridoxine -Suicide precautions -Psych evaluation  Acute kidney injury -Continue fluid resuscitation -Trend BMP  Hypertension, hyperlipidemia -Continue atorvastatin, Imdur, Toprol -Will resume lisinopril once kidney dysfunction improves  Diabetes -SSI -Hold metformin, NovoLog at present  History of schizoaffective disorder, chronic back pain -Continue medications for pain control  Active smoker -Smoking cessation counseling  Best practice:  Diet: regular Pain/Anxiety/Delirium protocol (if indicated): none VAP protocol (if indicated): none DVT prophylaxis: Enoxaparin Glucose control: SSI Mobility: Bedrest Code Status: Full code Family Communication: Pending Disposition: ICU monitoring  Labs   CBC: Recent Labs  Lab 05/26/19 1828 05/30/19 0535 05/30/19 0718  WBC 4.7  --  5.8  NEUTROABS 2.7  --   --   HGB 14.8 14.3 14.5  HCT 43.8 42.0 41.3  MCV 89.0  --  87.5  PLT 164  --  137*    Basic Metabolic Panel: Recent Labs  Lab 05/26/19 1828 05/29/19 1654 05/29/19 2054 05/30/19 0535  NA 134* 127* 131* 135  K 4.2 3.6 3.6 4.0  CL 102 97* 101  --   CO2 24 16* 17*  --   GLUCOSE 205* 277* 216*  --   BUN 13 11 10   --   CREATININE 1.16 1.29*  1.34*  --   CALCIUM 9.6 8.7* 8.6*  --   MG  --  1.8  --   --   PHOS  --  4.0  --   --    GFR: Estimated Creatinine Clearance: 66 mL/min (A) (by C-G formula based on SCr of 1.34 mg/dL (H)). Recent Labs  Lab 05/26/19 1828 05/29/19 1654 05/29/19 2054 05/29/19 2315 05/30/19 0155 05/30/19 0718  WBC 4.7  --   --   --   --  5.8  LATICACIDVEN  --  >11.0* >11.0* 2.7* 1.8  --     Liver Function Tests: Recent Labs  Lab 05/26/19 1828 05/29/19 1654  AST 16 16  ALT 20 15  ALKPHOS 76 63  BILITOT 0.7 0.7  PROT 7.3 6.7  ALBUMIN 4.5 4.0   No results for input(s): LIPASE, AMYLASE in the last 168 hours. No results for input(s): AMMONIA in the last 168 hours.  ABG    Component Value Date/Time   PHART 7.401 05/30/2019 0535   PCO2ART 29.2 (L) 05/30/2019 0535   PO2ART 91.0 05/30/2019 0535   HCO3 18.1 (L) 05/30/2019 0535   TCO2 19 (L) 05/30/2019 0535   ACIDBASEDEF 5.0 (H) 05/30/2019 0535   O2SAT 97.0 05/30/2019 0535     Coagulation Profile: No results for input(s): INR, PROTIME in the last 168 hours.  Cardiac Enzymes: No results for input(s): CKTOTAL, CKMB, CKMBINDEX, TROPONINI in the last 168 hours.  HbA1C: HB A1C (BAYER DCA - WAIVED)  Date/Time Value Ref Range Status  11/11/2016 11:32 AM 7.6 (H) <7.0 % Final    Comment:                                          Diabetic Adult            <7.0                                       Healthy Adult        4.3 - 5.7                                                           (DCCT/NGSP) American Diabetes Association's Summary of Glycemic Recommendations for Adults with Diabetes: Hemoglobin A1c <7.0%. More stringent glycemic goals (A1c <6.0%) may further reduce complications at the cost of increased risk of hypoglycemia.    Hgb A1c MFr Bld  Date/Time Value Ref Range Status  05/03/2019 09:40 AM 8.8 (H) 4.8 - 5.6 % Final    Comment:    (NOTE) Pre diabetes:          5.7%-6.4% Diabetes:              >6.4% Glycemic control for    <  7.0% adults with diabetes   04/13/2018 10:29 PM 10.7 (H) 4.8 - 5.6 % Final    Comment:    (NOTE) Pre diabetes:          5.7%-6.4% Diabetes:              >6.4% Glycemic control for   <7.0% adults with diabetes     CBG: Recent Labs  Lab 05/27/19 0118 05/27/19 0837  GLUCAP 219* 175*    Review of Systems:   Review of Systems  Constitutional: Positive for malaise/fatigue.  HENT: Negative.   Eyes: Negative.   Cardiovascular: Negative.   Musculoskeletal: Positive for back pain.  Skin: Negative.    Past Medical History  He,  has a past medical history of Alcohol dependence ([place]) (1979), Back pain, Benzodiazepine dependence ([place]), Cardiac arrest ([place]), Chronic back pain, Cocaine abuse ([place]), COPD (chronic obstructive pulmonary disease) ([place]), Diabetes mellitus without complication ([place]) (6834), Hypertension (2008), MI (myocardial infarction) ([place]), Non-compliance, Schizoaffective disorder ([place]) (2006 ), and Substance abuse ([place]).   Surgical History    Past Surgical History:  Procedure Laterality Date  . CYST EXCISION       Social History   reports that he has been smoking cigarettes. He has been smoking about 1.50 packs per day. He has never used smokeless tobacco. He reports current alcohol use. He reports previous drug use. Drug: Cocaine.   Family History   His family history includes Diabetes in his maternal grandmother; Heart disease in his father, maternal grandmother, and mother.   Allergies Allergies  Allergen Reactions  . Quetiapine Anaphylaxis  . Seroquel [Quetiapine Fumarate] Anxiety and Other (See Comments)    Reaction:  Nightmares   . Trazodone Anaphylaxis  . Trazodone And Nefazodone Anxiety and Other (See Comments)    Other reaction(s): Unknown Nightmares Reaction:  Nightmares   . Vistaril [Hydroxyzine Hcl] Nausea And Vomiting  . Haldol [Haloperidol Lactate] Anxiety and Other (See Comments)    Reaction:  Nightmares     The patient is critically ill  with multiple organ system failure and requires high complexity decision making for assessment and support, frequent evaluation and titration of therapies, advanced monitoring, review of radiographic studies and interpretation of complex data.    Critical Care Time devoted to patient care services, exclusive of separately billable procedures, described in this note is 30 minutes.   Sherrilyn Rist, MD [place], PCCM Cell: [phone]

## 2019-05-30 NOTE — Consult Note (Signed)
Telepsych Consultation   Reason for Consult:  Suicide attempt  Referring Physician:  Dr. Brand Males Location of Patient: [facility] Location of Provider: [facility]  Patient Identification: [name] MRN:  [id number] Principal Diagnosis: Suicide attempt [facility]) Diagnosis:  Principal Problem:   Suicide attempt [facility]) Active Problems:   Overdose   Total Time spent with patient: 1 hour  Subjective:   [name] is a 58 y.o. male patient admitted with suicide attempt by ingesting antifreeze.  HPI:   Per chart review, patient was admitted with suicide attempt by ingesting antifreeze. He ingested about 10 ounces of antifreeze. He called EMS due to GI upset. He received Fomepizole, thiamine and pyridoxine. He was given fluids for AKI. He was transferred from [facility] for further management. UDS and BAL were negative on admission.   Of note, patient was last seen by TTS at [facility] ED on 8/9 for SI with plan to overdose. He reported being off his medication for a month. He was initially recommended for inpatient psychiatric hospitalization but later denied SI and was psychiatrically cleared. He was last admitted to a psychiatric facility a month ago. Home medications include Gabapentin 800 mg QID, Atarax 10 mg TID and monthly Invega Sustenna 234 mg. He is prescribed Ativan 1-2 mg q 4 hours PRN for anxiety in the hospital. He received 2 mg this morning.   On interview, [name] reports that he was trying to drink antifreeze to get high. He reports that he probably said that he wanted to harm himself yesterday because "I wasn't in my right mind." He frequently requests Ativan for anxiety. He reports that he was prescribed Ativan at [facility]. He reports that he last received the Invega injection a week ago. He believes that it causes paranoia. He denies SI, HI or AVH. He reports, "I'm back to normal. Write me a script for Ativan and I'll be okay." He is disorganized  in thought process throughout the interview and nonsensical at times. He makes a statement about a truck driving by but is unable to elaborate.   Past Psychiatric History: Schizoaffective disorder, anxiety and substance abuse (crack cocaine)  Risk to Self:   Yes given recent ingestion of antifreeze.  Risk to Others:  None. Denies HI.  Prior Inpatient Therapy:  He was last hospitalized a month ago at [facility].  Prior Outpatient Therapy:  [facility]  Past Medical History:  Past Medical History:  Diagnosis Date  . Alcohol dependence ([place]) 1979   stated abusing ETOH at age 8   . Back pain   . Benzodiazepine dependence ([place])   . Cardiac arrest ([place])   . Chronic back pain   . Cocaine abuse ([place])   . COPD (chronic obstructive pulmonary disease) ([place])   . Diabetes mellitus without complication ([place]) 4034  . Hypertension 2008  . MI (myocardial infarction) ([place])   . Non-compliance   . Schizoaffective disorder ([place]) 2006   . Substance abuse [facility])     Past Surgical History:  Procedure Laterality Date  . CYST EXCISION     Family History:  Family History  Problem Relation Age of Onset  . Heart disease Mother   . Heart disease Father   . Heart disease Maternal Grandmother   . Diabetes Maternal Grandmother    Family Psychiatric  History: Denies  Social History:  Social History   Substance and Sexual Activity  Alcohol Use Yes  . Alcohol/week: 0.0 standard drinks   Comment:  last drank week ago     Social History   Substance and Sexual Activity  Drug Use Not Currently  . Types: Cocaine   Comment: Denies any use in past 6 months    Social History   Socioeconomic History  . Marital status: Single    Spouse name: Not on file  . Number of children: Not on file  . Years of education: Not on file  . Highest education level: Not on file  Occupational History  . Not on file  Social Needs  . Financial resource strain: Not on file  . Food insecurity    Worry: Not on file     Inability: Not on file  . Transportation needs    Medical: Not on file    Non-medical: Not on file  Tobacco Use  . Smoking status: Current Every Day Smoker    Packs/day: 1.50    Types: Cigarettes  . Smokeless tobacco: Never Used  Substance and Sexual Activity  . Alcohol use: Yes    Alcohol/week: 0.0 standard drinks    Comment: last drank week ago  . Drug use: Not Currently    Types: Cocaine    Comment: Denies any use in past 6 months  . Sexual activity: Never  Lifestyle  . Physical activity    Days per week: Not on file    Minutes per session: Not on file  . Stress: Not on file  Relationships  . Social Herbalist on phone: Not on file    Gets together: Not on file    Attends religious service: Not on file    Active member of club or organization: Not on file    Attends meetings of clubs or organizations: Not on file    Relationship status: Not on file  Other Topics Concern  . Not on file  Social History Narrative  . Not on file   Additional Social History: He lives alone. He is divorced. He does not have children. He denies alcohol or illicit substance use.      Allergies:   Allergies  Allergen Reactions  . Quetiapine Anaphylaxis  . Seroquel [Quetiapine Fumarate] Anxiety and Other (See Comments)    Reaction:  Nightmares   . Trazodone Anaphylaxis  . Trazodone And Nefazodone Anxiety and Other (See Comments)    Other reaction(s): Unknown Nightmares Reaction:  Nightmares   . Vistaril [Hydroxyzine Hcl] Nausea And Vomiting  . Haldol [Haloperidol Lactate] Anxiety and Other (See Comments)    Reaction:  Nightmares     Labs:  Results for orders placed or performed during the hospital encounter of 05/29/19 (from the past 48 hour(s))  Comprehensive metabolic panel     Status: Abnormal   Collection Time: 05/29/19  4:54 PM  Result Value Ref Range   Sodium 127 (L) 135 - 145 mmol/L   Potassium 3.6 3.5 - 5.1 mmol/L   Chloride 97 (L) 98 - 111 mmol/L   CO2 16 (L)  22 - 32 mmol/L   Glucose, Bld 277 (H) 70 - 99 mg/dL   BUN 11 6 - 20 mg/dL   Creatinine, Ser 1.29 (H) 0.61 - 1.24 mg/dL   Calcium 8.7 (L) 8.9 - 10.3 mg/dL   Total Protein 6.7 6.5 - 8.1 g/dL   Albumin 4.0 3.5 - 5.0 g/dL   AST 16 15 - 41 U/L   ALT 15 0 - 44 U/L   Alkaline Phosphatase 63 38 - 126 U/L   Total Bilirubin 0.7 0.3 -  1.2 mg/dL   GFR calc non Af Amer >60 >60 mL/min   GFR calc Af Amer >60 >60 mL/min   Anion gap 14 5 - 15    Comment: Performed at [facility], [address] [place], [place] [address]  Lactic acid, plasma     Status: Abnormal   Collection Time: 05/29/19  4:54 PM  Result Value Ref Range   Lactic Acid, Venous >11.0 (HH) 0.5 - 1.9 mmol/L    Comment: CRITICAL RESULT CALLED TO, READ BACK BY AND VERIFIED WITH: MINTER,R ON 05/29/19 AT 1815 BY LOY,C Performed at [facility], [address] [place], [place] [address]   Salicylate level     Status: None   Collection Time: 05/29/19  4:54 PM  Result Value Ref Range   Salicylate Lvl <6.8 2.8 - 30.0 mg/dL    Comment: Performed at [facility], [address] [place], [place] [address]  Acetaminophen level     Status: Abnormal   Collection Time: 05/29/19  4:54 PM  Result Value Ref Range   Acetaminophen (Tylenol), Serum <10 (L) 10 - 30 ug/mL    Comment: (NOTE) Therapeutic concentrations vary significantly. A range of 10-30 ug/mL  may be an effective concentration for many patients. However, some  are best treated at concentrations outside of this range. Acetaminophen concentrations >150 ug/mL at 4 hours after ingestion  and >50 ug/mL at 12 hours after ingestion are often associated with  toxic reactions. Performed at [facility], [address], [place], [place] [address]   Magnesium     Status: None   Collection Time: 05/29/19  4:54 PM  Result Value Ref Range   Magnesium 1.8 1.7 - 2.4 mg/dL    Comment: Performed at [facility], [address] [place], [place] [address]  Phosphorus     Status: None   Collection  Time: 05/29/19  4:54 PM  Result Value Ref Range   Phosphorus 4.0 2.5 - 4.6 mg/dL    Comment: Performed at [facility], [address] [place], [place] [address]  Osmolality     Status: None   Collection Time: 05/29/19  4:55 PM  Result Value Ref Range   Osmolality 287 275 - 295 mOsm/kg    Comment: Performed at [facility] [address], [place], [place] [address]  Ethanol     Status: None   Collection Time: 05/29/19  5:07 PM  Result Value Ref Range   Alcohol, Ethyl (B) <10 <10 mg/dL    Comment: (NOTE) Lowest detectable limit for serum alcohol is 10 mg/dL. For medical purposes only. Performed at [facility], [address], [place], [place] [address]   Blood gas, arterial (at [facility])     Status: Abnormal   Collection Time: 05/29/19  5:40 PM  Result Value Ref Range   FIO2 21.00    pH, Arterial 7.349 (L) 7.350 - 7.450   pCO2 arterial 35.5 32.0 - 48.0 mmHg   pO2, Arterial 44.7 (L) 83.0 - 108.0 mmHg   Bicarbonate 19.7 (L) 20.0 - 28.0 mmol/L   Acid-base deficit 5.6 (H) 0.0 - 2.0 mmol/L   O2 Saturation 81.1 %   Patient temperature 36.8    Allens test (pass/fail) PASS PASS    Comment: Performed at [facility], [address], [place], [place] [address]  Urinalysis, Routine w reflex microscopic     Status: Abnormal   Collection Time: 05/29/19  8:46 PM  Result Value Ref Range   Color, Urine STRAW (A)  YELLOW   APPearance CLEAR CLEAR   Specific Gravity, Urine 1.003 (L) 1.005 - 1.030   pH 6.0 5.0 - 8.0   Glucose, UA NEGATIVE NEGATIVE mg/dL   Hgb urine dipstick NEGATIVE NEGATIVE   Bilirubin Urine NEGATIVE NEGATIVE   Ketones, ur NEGATIVE NEGATIVE mg/dL   Protein, ur NEGATIVE NEGATIVE mg/dL   Nitrite NEGATIVE NEGATIVE   Leukocytes,Ua NEGATIVE NEGATIVE    Comment: Performed at [facility], [address], [place], [place] [address]  Rapid urine drug screen (hospital performed)     Status: None   Collection Time: 05/29/19  8:46 PM  Result Value Ref Range   Opiates NONE  DETECTED NONE DETECTED   Cocaine NONE DETECTED NONE DETECTED   Benzodiazepines NONE DETECTED NONE DETECTED   Amphetamines NONE DETECTED NONE DETECTED   Tetrahydrocannabinol NONE DETECTED NONE DETECTED   Barbiturates NONE DETECTED NONE DETECTED    Comment: (NOTE) DRUG SCREEN FOR MEDICAL PURPOSES ONLY.  IF CONFIRMATION IS NEEDED FOR ANY PURPOSE, NOTIFY LAB WITHIN 5 DAYS. LOWEST DETECTABLE LIMITS FOR URINE DRUG SCREEN Drug Class                     Cutoff (ng/mL) Amphetamine and metabolites    1000 Barbiturate and metabolites    200 Benzodiazepine                 330 Tricyclics and metabolites     300 Opiates and metabolites        300 Cocaine and metabolites        300 THC                            50 Performed at [facility], [address] [place], [place] [address]   Lactic acid, plasma     Status: Abnormal   Collection Time: 05/29/19  8:54 PM  Result Value Ref Range   Lactic Acid, Venous >11.0 (HH) 0.5 - 1.9 mmol/L    Comment: CRITICAL RESULT CALLED TO, READ BACK BY AND VERIFIED WITH: NICHOLS,K ON 05/29/19 AT 2125 BY LOY,C Performed at [facility], [address], [place], [place] [address]   Basic metabolic panel     Status: Abnormal   Collection Time: 05/29/19  8:54 PM  Result Value Ref Range   Sodium 131 (L) 135 - 145 mmol/L   Potassium 3.6 3.5 - 5.1 mmol/L   Chloride 101 98 - 111 mmol/L   CO2 17 (L) 22 - 32 mmol/L   Glucose, Bld 216 (H) 70 - 99 mg/dL   BUN 10 6 - 20 mg/dL   Creatinine, Ser 1.34 (H) 0.61 - 1.24 mg/dL   Calcium 8.6 (L) 8.9 - 10.3 mg/dL   GFR calc non Af Amer 58 (L) >60 mL/min   GFR calc Af Amer >60 >60 mL/min   Anion gap 13 5 - 15    Comment: Performed at [facility], [address] [place], [place] [address]  Lactic acid, plasma     Status: Abnormal   Collection Time: 05/29/19 11:15 PM  Result Value Ref Range   Lactic Acid, Venous 2.7 (HH) 0.5 - 1.9 mmol/L    Comment: CRITICAL RESULT CALLED TO, READ BACK BY AND VERIFIED WITH: B CRABTREE,RN  _0  05/30/19 [facility] Performed at [facility], [address] [place], [place] [address]   Lactic acid, plasma     Status: None   Collection Time: 05/30/19  1:55 AM  Result Value Ref  Range   Lactic Acid, Venous 1.8 0.5 - 1.9 mmol/L    Comment: Performed at [facility], [address], [place], [place] [address]  Glucose, capillary     Status: Abnormal   Collection Time: 05/30/19  3:52 AM  Result Value Ref Range   Glucose-Capillary 253 (H) 70 - 99 mg/dL   Comment 1 QC Due   MRSA PCR Screening     Status: None   Collection Time: 05/30/19  3:58 AM   Specimen: Nasal Mucosa; Nasopharyngeal  Result Value Ref Range   MRSA by PCR NEGATIVE NEGATIVE    Comment:        The GeneXpert MRSA Assay (FDA approved for NASAL specimens only), is one component of a comprehensive MRSA colonization surveillance program. It is not intended to diagnose MRSA infection nor to guide or monitor treatment for MRSA infections. Performed at [facility], [place] [address], [place], [place] [address]   SARS Coronavirus 2 [facility] order, Performed in [facility] hospital lab) Nasopharyngeal Nasopharyngeal Swab     Status: None   Collection Time: 05/30/19  4:12 AM   Specimen: Nasopharyngeal Swab  Result Value Ref Range   SARS Coronavirus 2 NEGATIVE NEGATIVE    Comment: (NOTE) If result is NEGATIVE SARS-CoV-2 target nucleic acids are NOT DETECTED. The SARS-CoV-2 RNA is generally detectable in upper and lower  respiratory specimens during the acute phase of infection. The lowest  concentration of SARS-CoV-2 viral copies this assay can detect is 250  copies / mL. A negative result does not preclude SARS-CoV-2 infection  and should not be used as the sole basis for treatment or other  patient management decisions.  A negative result may occur with  improper specimen collection / handling, submission of specimen other  than nasopharyngeal swab, presence of viral mutation(s) within the  areas targeted by  this assay, and inadequate number of viral copies  (<250 copies / mL). A negative result must be combined with clinical  observations, patient history, and epidemiological information. If result is POSITIVE SARS-CoV-2 target nucleic acids are DETECTED. The SARS-CoV-2 RNA is generally detectable in upper and lower  respiratory specimens dur ing the acute phase of infection.  Positive  results are indicative of active infection with SARS-CoV-2.  Clinical  correlation with patient history and other diagnostic information is  necessary to determine patient infection status.  Positive results do  not rule out bacterial infection or co-infection with other viruses. If result is PRESUMPTIVE POSTIVE SARS-CoV-2 nucleic acids MAY BE PRESENT.   A presumptive positive result was obtained on the submitted specimen  and confirmed on repeat testing.  While 2019 novel coronavirus  (SARS-CoV-2) nucleic acids may be present in the submitted sample  additional confirmatory testing may be necessary for epidemiological  and / or clinical management purposes  to differentiate between  SARS-CoV-2 and other Sarbecovirus currently known to infect humans.  If clinically indicated additional testing with an alternate test  methodology [phone]) is advised. The SARS-CoV-2 RNA is generally  detectable in upper and lower respiratory sp ecimens during the acute  phase of infection. The expected result is Negative. Fact Sheet for Patients:  StrictlyIdeas.no Fact Sheet for Healthcare Providers: BankingDealers.co.za This test is not yet approved or cleared by the [place] FDA and has been authorized for detection and/or diagnosis of SARS-CoV-2 by FDA under an Emergency Use Authorization (EUA).  This EUA will remain in effect (meaning this test can be used) for the duration of the COVID-19  declaration under Section 564(b)(1) of the Act, 21 U.S.C. section  360bbb-3(b)(1), unless the authorization is terminated or revoked sooner. Performed at [facility], [place] [address], [place], [place] [address]   I-STAT 7, (LYTES, BLD GAS, ICA, H+H)     Status: Abnormal   Collection Time: 05/30/19  5:35 AM  Result Value Ref Range   pH, Arterial 7.401 7.350 - 7.450   pCO2 arterial 29.2 (L) 32.0 - 48.0 mmHg   pO2, Arterial 91.0 83.0 - 108.0 mmHg   Bicarbonate 18.1 (L) 20.0 - 28.0 mmol/L   TCO2 19 (L) 22 - 32 mmol/L   O2 Saturation 97.0 %   Acid-base deficit 5.0 (H) 0.0 - 2.0 mmol/L   Sodium 135 135 - 145 mmol/L   Potassium 4.0 3.5 - 5.1 mmol/L   Calcium, Ion 1.27 1.15 - 1.40 mmol/L   HCT 42.0 39.0 - 52.0 %   Hemoglobin 14.3 13.0 - 17.0 g/dL   Patient temperature 98.6 F    Collection site RADIAL, ALLEN'S TEST ACCEPTABLE    Drawn by RT    Sample type ARTERIAL   Basic metabolic panel     Status: Abnormal   Collection Time: 05/30/19  7:18 AM  Result Value Ref Range   Sodium 135 135 - 145 mmol/L   Potassium 4.2 3.5 - 5.1 mmol/L   Chloride 106 98 - 111 mmol/L   CO2 19 (L) 22 - 32 mmol/L   Glucose, Bld 203 (H) 70 - 99 mg/dL   BUN 10 6 - 20 mg/dL   Creatinine, Ser 1.27 (H) 0.61 - 1.24 mg/dL   Calcium 9.2 8.9 - 10.3 mg/dL   GFR calc non Af Amer >60 >60 mL/min   GFR calc Af Amer >60 >60 mL/min   Anion gap 10 5 - 15    Comment: Performed at [facility], [place] [address], [place], [place] [address]  CBC     Status: Abnormal   Collection Time: 05/30/19  7:18 AM  Result Value Ref Range   WBC 5.8 4.0 - 10.5 K/uL   RBC 4.72 4.22 - 5.81 MIL/uL   Hemoglobin 14.5 13.0 - 17.0 g/dL   HCT 41.3 39.0 - 52.0 %   MCV 87.5 80.0 - 100.0 fL   MCH 30.7 26.0 - 34.0 pg   MCHC 35.1 30.0 - 36.0 g/dL   RDW 14.5 11.5 - 15.5 %   Platelets 137 (L) 150 - 400 K/uL    Comment: REPEATED TO VERIFY Immature Platelet Fraction may be clinically indicated, consider ordering this additional test [id number]    nRBC 0.0 0.0 - 0.2 %    Comment: Performed at [facility], [place] [address] [place], [place] [address]  Magnesium     Status: None   Collection Time: 05/30/19  7:18 AM  Result Value Ref Range   Magnesium 1.8 1.7 - 2.4 mg/dL    Comment: Performed at [facility] [address], [place], [place] [address]  Phosphorus     Status: None   Collection Time: 05/30/19  7:18 AM  Result Value Ref Range   Phosphorus 3.5 2.5 - 4.6 mg/dL    Comment: Performed at [facility] [address], [place], [place] [address]  Glucose, capillary     Status: Abnormal   Collection Time: 05/30/19  7:33 AM  Result Value Ref Range   Glucose-Capillary 186 (H) 70 - 99 mg/dL    Medications:  Current Facility-Administered Medications  Medication Dose Route Frequency Provider Last Rate Last Dose  . albuterol (PROVENTIL) (2.5 MG/3ML) 0.083% nebulizer solution 2.5 mg  2.5 mg Nebulization Q3H PRN Desai, Rahul P, PA-C      . aspirin chewable tablet 81 mg  81 mg Oral Daily Desai, Rahul P, PA-C      . atorvastatin (LIPITOR) tablet 40 mg  40 mg Oral q1800 Desai, Rahul P, PA-C      . enoxaparin (LOVENOX) injection 40 mg  40 mg Subcutaneous Q24H Olalere, Adewale A, MD      . folic acid injection 1 mg  1 mg Intravenous Daily Desai, Rahul P, PA-C      . fomepizole (ANTIZOL) 880 mg in sodium chloride 0.9 % 100 mL IVPB  10 mg/kg Intravenous Q12H Brand Males, MD      . gabapentin (NEURONTIN) capsule 800 mg  800 mg Oral TID Brand Males, MD   800 mg at 05/30/19 0456  . hydrOXYzine (ATARAX/VISTARIL) tablet 10 mg  10 mg Oral TID Shearon Stalls, Rahul P, PA-C      . insulin aspart (novoLOG) injection 0-15 Units  0-15 Units Subcutaneous Q4H Desai, Rahul P, PA-C   3 Units at 05/30/19 0749  . insulin detemir (LEVEMIR) injection 10 Units  10 Units Subcutaneous Daily Olalere, Adewale A, MD      . isosorbide mononitrate (IMDUR) 24 hr tablet 30 mg  30 mg Oral Daily Desai, Rahul P, PA-C      . lactated ringers infusion   Intravenous Continuous Desai, Rahul P, PA-C   Stopped at  05/30/19 0501  . LORazepam (ATIVAN) injection 1-2 mg  1-2 mg Intravenous Q4H PRN Shearon Stalls, Rahul P, PA-C   2 mg at 05/30/19 0818  . metoprolol succinate (TOPROL-XL) 24 hr tablet 50 mg  50 mg Oral Daily Desai, Rahul P, PA-C      . mometasone-formoterol (DULERA) 200-5 MCG/ACT inhaler 2 puff  2 puff Inhalation BID Desai, Rahul P, PA-C      . nicotine (NICODERM CQ - dosed in mg/24 hours) patch 21 mg  21 mg Transdermal Daily Olalere, Adewale A, MD      . pyridOXINE (B-6) injection 100 mg  100 mg Intravenous Daily Suella Broad A, PA-C   100 mg at 05/29/19 1833  . sodium bicarbonate injection 50 mEq  50 mEq Intravenous Once Desai, Rahul P, PA-C      . sodium chloride 0.9 % bolus 1,000 mL  1,000 mL Intravenous Once Suella Broad A, PA-C      . thiamine (B-1) injection 100 mg  100 mg Intravenous Daily Desai, Rahul P, PA-C        Musculoskeletal: Strength & Muscle Tone: No atrophy noted. Gait & Station: UTA since patient is lying in bed. Patient leans: N/A  Psychiatric Specialty Exam: Physical Exam  Nursing note and vitals reviewed. Constitutional: He is oriented to person, place, and time. He appears well-developed and well-nourished.  HENT:  Head: Normocephalic and atraumatic.  Neck: Normal range of motion.  Respiratory: Effort normal.  Musculoskeletal: Normal range of motion.  Neurological: He is alert and oriented to person, place, and time.  Psychiatric: His behavior is normal. His affect is labile. His speech is tangential. Thought content is paranoid and delusional. Cognition and memory are impaired. He expresses impulsivity.    Review of Systems  Gastrointestinal: Negative for constipation, diarrhea, nausea and vomiting.  Psychiatric/Behavioral: Negative for hallucinations and suicidal ideas. The patient is nervous/anxious.   All other systems reviewed and are negative.  Blood pressure (!) 166/99, pulse 80, temperature (!) 97.1 F (36.2 C), temperature source Oral, resp. rate 16,  height 6' (1.829 m), weight 88.4 kg, SpO2 98 %.Body mass index is 26.43 kg/m.  General Appearance: Fairly Groomed, middle aged, Caucasian male, wearing a hospital gown who is lying in bed. NAD.   Eye Contact:  Good  Speech:  Normal Rate and Slurred  Volume:  Normal  Mood:  Anxious  Affect:  Labile  Thought Process:  Disorganized and Descriptions of Associations: Tangential  Orientation:  Full (Time, Place, and Person)  Thought Content:  Delusions and Paranoid Ideation  Suicidal Thoughts:  No  Homicidal Thoughts:  No  Memory:  Immediate;   Fair Recent;   Fair Remote;   Fair  Judgement:  Poor  Insight:  Fair  Psychomotor Activity:  Normal  Concentration:  Concentration: Fair and Attention Span: Fair  Recall:  Good  Fund of Knowledge:  Fair  Language:  Fair  Akathisia:  No  Handed:  Right  AIMS (if indicated):   N/A  Assets:  Desire for Improvement Housing Resilience  ADL's:  Intact  Cognition:  Impaired due to psychiatric condition.   Sleep:   N/A   Assessment:  [name] is a 58 y.o. male who was admitted with admitted with suicide attempt by ingesting antifreeze. Patient is disorganized in thought process and labile in affect. He denies SI, HI or AVH. He does not appear to be responding to internal stimuli at this time but endorses paranoia and delusional thoughts. Although he currently denies a suicide attempt his reported reason for ingesting antifreeze is not rational. He has a prior history of suicide attempts. He warrants inpatient psychiatric hospitalization for stabilization and treatment.   Treatment Plan Summary: -Patient warrants inpatient psychiatric hospitalization given high risk of harm to self. -Continue bedside sitter.  -Start Saphris 5 mg BID for mood stabilization/psychosis.  -EKG reviewed and QTc 467 on 8/11. Please closely monitor when starting or increasing QTc prolonging agents.  -Please pursue involuntary commitment if patient refuses voluntary  psychiatric hospitalization or attempts to leave the hospital.  -Will sign off on patient at this time. Please consult psychiatry again as needed.     Disposition: Recommend psychiatric Inpatient admission when medically cleared.  This service was provided via telemedicine using a 2-way, interactive audio and video technology.  Names of all persons participating in this telemedicine service and their role in this encounter. Name: Buford Dresser, DO Role: Psychiatrist   Name: [name] Role: Patient    Faythe Dingwall, DO 05/30/2019 10:56 AM

## 2019-05-30 NOTE — ED Notes (Signed)
Report to [place] with [facility]

## 2019-05-30 NOTE — H&P (Signed)
NAME:  [name], MRN:  [id number], DOB:  August 14, 1961, LOS: 0 ADMISSION DATE:  05/29/2019, CONSULTATION DATE:  05/30/19 REFERRING MD:  [facility]  CHIEF COMPLAINT:  Ethylene Glycol ingestion   Brief History   [name] is a 58 y.o. male who was transferred from [facility] after drinking roughly 10oz antifreeze in a suicide attempt.  History of present illness   [name] is a 58 y.o. male who has a PMH including but not limited to Substance abuse, shizoaffective disorder, MI, HTN, cardiac arrest, COPD, DM (see "past medical history" for rest).  He presented to [facility] 8/11 after drinking roughly 10oz of antifreeze as part of a suicide attempt.  He called EMS because his stomach hurt and states that he has done the same in the past and forgot how much his stomach hurt afterwards.  In ED, he received fomepizole (loading dose - 15 mg/kg), thiamine, pyridoxine.  He was then transferred to [facility] for further evaluation and management.    UDS negative. Ethylene glycol pending.  Osmoles 287, osmolar gap 9, SCr 1.34, ABG 7.35 / 35 / 45 / 5.6.  Past Medical History  Substance abuse, shizoaffective disorder, MI, HTN, cardiac arrest, COPD, DM.  Significant Hospital Events   8/11 > admit.  Consults:  Psychiatry.  Procedures:  None.  Significant Diagnostic Tests:  None.  Micro Data:  SARS CoV2 8/12 >   Antimicrobials:  None.   Interim history/subjective:  No complaints besides feeling nervous, asking for "my ativan and neurontin".  Objective:  Blood pressure (!) 187/111, pulse 89, temperature 97.6 F (36.4 C), temperature source Oral, resp. rate (!) 21, height 6' (1.829 m), weight 88.4 kg, SpO2 100 %.       No intake or output data in the 24 hours ending 05/30/19 0416 Filed Weights   05/29/19 1655 05/30/19 0355  Weight: 99.6 kg 88.4 kg    Examination: General: Adult male, in NAD. Neuro: A&O x 3, no deficits. HEENT: [place]/AT. Poor dentition.  Sclerae anicteric.  EOMI. Cardiovascular: RRR,  no M/R/G.  Lungs: Respirations even and unlabored.  Diminished bilaterally. Abdomen: BS x 4, soft, NT/ND.  Musculoskeletal: No gross deformities, no edema.  Skin: Intact, warm, no rashes.  Assessment & Plan:   Ethylene glycol ingestion as part of suicide attempt - roughly 10oz reported by EMS.  S/p loading dose of fomepizole (15mg /kg), thiamine, pyridoxine. - Continue fomepizole at 10mg /kg q12hrs. - F/u on ethylene glycol levels, repeat ABG. - No role HD, HCO3 infusion, charcoal at this point. - Thiamine / Folate / pyridoxine. - Suicide precautions. - Psychiatry consult.  AKI. - LR @ 100. - Follow BMP.  Hx HTN, HLD. - Continue preadmission ASA, atorvastatin, imdur, toprol-xl. - Hold preadmission lisinopril 2/2 AKI.  Hx COPD. - Dulera in lieu of preadmission symbicort. - Albuterol PRN.  Hx DM. - SSI. - Hold preadmission metformin, levemir, novolog.  Hx schizoaffective disorder, back pain. - Continue preadmission hydroxyzine, gabapentin. - PRN ativan for anxiety (states that he takes this daily but not listed on home med list). - Hold preadmission baclofen, paliperidone.   Best Practice:  Diet: Regular. Pain/Anxiety/Delirium protocol (if indicated): None. VAP protocol (if indicated): None. DVT prophylaxis: SCD's / Heparin. GI prophylaxis: None. Glucose control: SSI. Mobility: Bedrest. Code Status: Full. Family Communication: None available. Disposition: ICU.  Labs   CBC: Recent Labs  Lab 05/26/19 1828  WBC 4.7  NEUTROABS 2.7  HGB 14.8  HCT 43.8  MCV 89.0  PLT  259   Basic Metabolic Panel: Recent Labs  Lab 05/26/19 1828 05/29/19 1654 05/29/19 2054  NA 134* 127* 131*  K 4.2 3.6 3.6  CL 102 97* 101  CO2 24 16* 17*  GLUCOSE 205* 277* 216*  BUN 13 11 10   CREATININE 1.16 1.29* 1.34*  CALCIUM 9.6 8.7* 8.6*  MG  --  1.8  --   PHOS  --  4.0  --    GFR: Estimated Creatinine Clearance: 66 mL/min (A) (by C-G formula based on SCr of 1.34 mg/dL (H)).  Recent Labs  Lab 05/26/19 1828 05/29/19 1654 05/29/19 2054 05/29/19 2315 05/30/19 0155  WBC 4.7  --   --   --   --   LATICACIDVEN  --  >11.0* >11.0* 2.7* 1.8   Liver Function Tests: Recent Labs  Lab 05/26/19 1828 05/29/19 1654  AST 16 16  ALT 20 15  ALKPHOS 76 63  BILITOT 0.7 0.7  PROT 7.3 6.7  ALBUMIN 4.5 4.0   No results for input(s): LIPASE, AMYLASE in the last 168 hours. No results for input(s): AMMONIA in the last 168 hours. ABG    Component Value Date/Time   PHART 7.349 (L) 05/29/2019 1740   PCO2ART 35.5 05/29/2019 1740   PO2ART 44.7 (L) 05/29/2019 1740   HCO3 19.7 (L) 05/29/2019 1740   TCO2 21 (L) 01/02/2018 2154   ACIDBASEDEF 5.6 (H) 05/29/2019 1740   O2SAT 81.1 05/29/2019 1740    Coagulation Profile: No results for input(s): INR, PROTIME in the last 168 hours. Cardiac Enzymes: No results for input(s): CKTOTAL, CKMB, CKMBINDEX, TROPONINI in the last 168 hours. HbA1C: HB A1C (BAYER DCA - WAIVED)  Date/Time Value Ref Range Status  11/11/2016 11:32 AM 7.6 (H) <7.0 % Final    Comment:                                          Diabetic Adult            <7.0                                       Healthy Adult        4.3 - 5.7                                                           (DCCT/NGSP) American Diabetes Association's Summary of Glycemic Recommendations for Adults with Diabetes: Hemoglobin A1c <7.0%. More stringent glycemic goals (A1c <6.0%) may further reduce complications at the cost of increased risk of hypoglycemia.    Hgb A1c MFr Bld  Date/Time Value Ref Range Status  05/03/2019 09:40 AM 8.8 (H) 4.8 - 5.6 % Final    Comment:    (NOTE) Pre diabetes:          5.7%-6.4% Diabetes:              >6.4% Glycemic control for   <7.0% adults with diabetes   04/13/2018 10:29 PM 10.7 (H) 4.8 - 5.6 % Final    Comment:    (NOTE) Pre diabetes:          5.7%-6.4% Diabetes:              >  6.4% Glycemic control for   <7.0% adults with diabetes     CBG: Recent Labs  Lab 05/27/19 0118 05/27/19 0837  GLUCAP 219* 175*    Review of Systems:   All negative; except for those that are bolded, which indicate positives.  Constitutional: weight loss, weight gain, night sweats, fevers, chills, fatigue, weakness.  HEENT: headaches, sore throat, sneezing, nasal congestion, post nasal drip, difficulty swallowing, tooth/dental problems, visual complaints, visual changes, ear aches. Neuro: difficulty with speech, weakness, numbness, ataxia, anxious. CV:  chest pain, orthopnea, PND, swelling in lower extremities, dizziness, palpitations, syncope.  Resp: cough, hemoptysis, dyspnea, wheezing. GI: heartburn, indigestion, abdominal pain, nausea, vomiting, diarrhea, constipation, change in bowel habits, loss of appetite, hematemesis, melena, hematochezia.  GU: dysuria, change in color of urine, urgency or frequency, flank pain, hematuria. MSK: joint pain or swelling, decreased range of motion. Psych: change in mood or affect, depression, anxiety, suicidal ideations, homicidal ideations. Skin: rash, itching, bruising.   Past medical history  He,  has a past medical history of Alcohol dependence ([place]) (1979), Back pain, Benzodiazepine dependence ([place]), Cardiac arrest ([place]), Chronic back pain, Cocaine abuse ([place]), COPD (chronic obstructive pulmonary disease) ([place]), Diabetes mellitus without complication ([place]) (5093), Hypertension (2008), MI (myocardial infarction) ([place]), Non-compliance, Schizoaffective disorder ([place]) (2006 ), and Substance abuse ([place]).   Surgical History    Past Surgical History:  Procedure Laterality Date  . CYST EXCISION       Social History   reports that he has been smoking cigarettes. He has been smoking about 1.50 packs per day. He has never used smokeless tobacco. He reports current alcohol use. He reports previous drug use. Drug: Cocaine.   Family history   His family history includes Diabetes in his maternal grandmother;  Heart disease in his father, maternal grandmother, and mother.   Allergies Allergies  Allergen Reactions  . Quetiapine Anaphylaxis  . Seroquel [Quetiapine Fumarate] Anxiety and Other (See Comments)    Reaction:  Nightmares   . Trazodone Anaphylaxis  . Trazodone And Nefazodone Anxiety and Other (See Comments)    Other reaction(s): Unknown Nightmares Reaction:  Nightmares   . Vistaril [Hydroxyzine Hcl] Nausea And Vomiting  . Haldol [Haloperidol Lactate] Anxiety and Other (See Comments)    Reaction:  Nightmares      Home meds  Prior to Admission medications   Medication Sig Start Date End Date Taking? Authorizing Provider  albuterol (PROVENTIL HFA;VENTOLIN HFA) 108 (90 Base) MCG/ACT inhaler Inhale 2 puffs into the lungs every 6 (six) hours as needed for wheezing or shortness of breath. 11/15/17   Lindell Spar I, NP  aspirin EC 81 MG tablet Take 81 mg by mouth daily.    [provider]  atorvastatin (LIPITOR) 40 MG tablet TAKE 1 TABLET BY MOUTH EVERY DAY: For high Cholesterol Patient taking differently: Take 40 mg by mouth daily at 6 PM.  11/15/17   Nwoko, Loleta Dicker, NP  baclofen (LIORESAL) 10 MG tablet Take 10 mg by mouth 3 (three) times daily.    [provider]  budesonide-formoterol (SYMBICORT) 80-4.5 MCG/ACT inhaler Inhale 2 puffs into the lungs 2 (two) times daily.    [provider]  gabapentin (NEURONTIN) 800 MG tablet Take 800 mg by mouth 4 (four) times daily. 12/08/17   [provider]  hydrOXYzine (ATARAX/VISTARIL) 10 MG tablet Take 10 mg by mouth 3 (three) times daily.    [provider]  ibuprofen (ADVIL,MOTRIN) 800 MG tablet Take 1 tablet (800 mg total) by mouth every  6 (six) hours as needed (moderate to severe pain). 11/15/17   Lindell Spar I, NP  insulin aspart (NOVOLOG) 100 UNIT/ML injection Inject 1-30 Units into the skin 3 (three) times daily with meals. SLIDING SCALE 02/12/19   [provider]  isosorbide mononitrate  (IMDUR) 30 MG 24 hr tablet Take 30 mg by mouth daily.    [provider]  LEVEMIR 100 UNIT/ML injection Inject 20 Units into the skin at bedtime.  04/26/19   [provider]  lisinopril (PRINIVIL,ZESTRIL) 10 MG tablet Take 1 tablet by mouth daily. 12/12/17   [provider]  metFORMIN (GLUCOPHAGE-XR) 500 MG 24 hr tablet Take 1 tablet (500 mg total) by mouth 2 (two) times daily with a meal. For diabetes management Patient taking differently: Take 1,000 mg by mouth 2 (two) times daily with a meal. For diabetes management 11/15/17   Lindell Spar I, NP  metoprolol succinate (TOPROL-XL) 25 MG 24 hr tablet Take 50 mg by mouth daily.    [provider]  nitroGLYCERIN (NITROSTAT) 0.4 MG SL tablet Place 0.4 mg under the tongue every 5 (five) minutes as needed for chest pain.    [provider]  omeprazole (PRILOSEC) 20 MG capsule Take 20 mg by mouth daily.    [provider]  paliperidone (INVEGA SUSTENNA) 234 MG/1.5ML SUSP injection Inject 234 mg into the muscle every 30 (thirty) days. (Due on 11-14-17): For mood control 11/15/17   Nwoko, Herbert Pun I, NP  SSD 1 % cream APPLY TO THE AFFECTED AREA(S) TWICE DAILY TO ULCER ON BUTTOCK AND RIGHT HEAL 05/22/19   [provider]    Critical care time: 40 min.    Montey Hora, [facility] Pager: [phone].  If no answer, [phone] 05/30/2019, 4:16 AM

## 2019-05-30 NOTE — Progress Notes (Signed)
Patient stated that he is homeless and does not have a bed at home. He stated that when he leaves that he wants to go to [employer] in [place], [place]. He stated that he does not have any family or friends. I asked him about the niece that brought him in and he stated that she has too many kids and does not care about him enough to take him in. He stated that the nurse at that particular [facility] is a great support system for him and that's where he wants to go. Will put in a social work consult.

## 2019-05-30 NOTE — Progress Notes (Signed)
Patient without IV access- unable to give IV medications- IV team attempted without success. Awaiting another IV team member with ultrasound to attempt.   Milford Cage, RN

## 2019-05-30 NOTE — Progress Notes (Signed)
When IV access was received, gave amp of Bicarb and started the LR infusion @ 100 mL/hr from night shift orders/RN report.

## 2019-05-30 NOTE — ED Notes (Signed)
ED TO INPATIENT HANDOFF REPORT  ED Nurse Name and Phone #: [phone]  S Name/Age/Gender [name] 58 y.o. male Room/Bed: [facility]16A/[facility]16A  Code Status   Code Status: Prior  Home/SNF/Other Home Patient oriented to: self, place, time and situation Is this baseline? Yes   Triage Complete: Triage complete  Chief Complaint Suicidal   Triage Note Pt drank 10oz of Antifreeze trying to kill himself. Ambulatory to triage. States his stomach is hurting him. Alert and oriented.    Allergies Allergies  Allergen Reactions  . Quetiapine Anaphylaxis  . Seroquel [Quetiapine Fumarate] Anxiety and Other (See Comments)    Reaction:  Nightmares   . Trazodone Anaphylaxis  . Trazodone And Nefazodone Anxiety and Other (See Comments)    Other reaction(s): Unknown Nightmares Reaction:  Nightmares   . Vistaril [Hydroxyzine Hcl] Nausea And Vomiting  . Haldol [Haloperidol Lactate] Anxiety and Other (See Comments)    Reaction:  Nightmares     Level of Care/Admitting Diagnosis ED Disposition    ED Disposition Condition [facility] Area: [place] [100100]  Level of Care: ICU [6]  Covid Evaluation: Asymptomatic Screening Protocol (No Symptoms)  Diagnosis: Overdose [993570]  Admitting Physician: Brand Males [phone]  Attending Physician: Brand Males [[id number]]  Estimated length of stay: 3 - 4 days  Certification:: I certify this patient will need inpatient services for at least 2 midnights  PT Class (Do Not Modify): Inpatient [101]  PT Acc Code (Do Not Modify): Private [1]       B Medical/Surgery History Past Medical History:  Diagnosis Date  . Alcohol dependence ([place]) 1979   stated abusing ETOH at age 63   . Back pain   . Benzodiazepine dependence ([place])   . Cardiac arrest ([place])   . Chronic back pain   . Cocaine abuse ([place])   . COPD (chronic obstructive pulmonary disease) ([place])   . Diabetes mellitus without complication ([place]) 3903  .  Hypertension 2008  . MI (myocardial infarction) ([place])   . Non-compliance   . Schizoaffective disorder ([place]) 2006   . Substance abuse [facility])    Past Surgical History:  Procedure Laterality Date  . CYST EXCISION       A IV Location/Drains/Wounds Patient Lines/Drains/Airways Status   Active Line/Drains/Airways    Name:   Placement date:   Placement time:   Site:   Days:   Peripheral IV 05/29/19 Right Antecubital   05/29/19    1825    Antecubital   1          Intake/Output Last 24 hours No intake or output data in the 24 hours ending 05/30/19 0204  Labs/Imaging Results for orders placed or performed during the hospital encounter of 05/29/19 (from the past 48 hour(s))  Comprehensive metabolic panel     Status: Abnormal   Collection Time: 05/29/19  4:54 PM  Result Value Ref Range   Sodium 127 (L) 135 - 145 mmol/L   Potassium 3.6 3.5 - 5.1 mmol/L   Chloride 97 (L) 98 - 111 mmol/L   CO2 16 (L) 22 - 32 mmol/L   Glucose, Bld 277 (H) 70 - 99 mg/dL   BUN 11 6 - 20 mg/dL   Creatinine, Ser 1.29 (H) 0.61 - 1.24 mg/dL   Calcium 8.7 (L) 8.9 - 10.3 mg/dL   Total Protein 6.7 6.5 - 8.1 g/dL   Albumin 4.0 3.5 - 5.0 g/dL   AST 16 15 - 41 U/L   ALT 15  0 - 44 U/L   Alkaline Phosphatase 63 38 - 126 U/L   Total Bilirubin 0.7 0.3 - 1.2 mg/dL   GFR calc non Af Amer >60 >60 mL/min   GFR calc Af Amer >60 >60 mL/min   Anion gap 14 5 - 15    Comment: Performed at [facility], [address] [place], [place] [address]  Lactic acid, plasma     Status: Abnormal   Collection Time: 05/29/19  4:54 PM  Result Value Ref Range   Lactic Acid, Venous >11.0 (HH) 0.5 - 1.9 mmol/L    Comment: CRITICAL RESULT CALLED TO, READ BACK BY AND VERIFIED WITH: MINTER,R ON 05/29/19 AT 1815 BY LOY,C Performed at [facility], [address], [place], [place] [address]   Salicylate level     Status: None   Collection Time: 05/29/19  4:54 PM  Result Value Ref Range   Salicylate Lvl <8.7 2.8 - 30.0 mg/dL    Comment:  Performed at [facility], [address], [place], [place] [address]  Acetaminophen level     Status: Abnormal   Collection Time: 05/29/19  4:54 PM  Result Value Ref Range   Acetaminophen (Tylenol), Serum <10 (L) 10 - 30 ug/mL    Comment: (NOTE) Therapeutic concentrations vary significantly. A range of 10-30 ug/mL  may be an effective concentration for many patients. However, some  are best treated at concentrations outside of this range. Acetaminophen concentrations >150 ug/mL at 4 hours after ingestion  and >50 ug/mL at 12 hours after ingestion are often associated with  toxic reactions. Performed at [facility], [address], [place], [place] [address]   Magnesium     Status: None   Collection Time: 05/29/19  4:54 PM  Result Value Ref Range   Magnesium 1.8 1.7 - 2.4 mg/dL    Comment: Performed at [facility], [address], [place], [place] [address]  Phosphorus     Status: None   Collection Time: 05/29/19  4:54 PM  Result Value Ref Range   Phosphorus 4.0 2.5 - 4.6 mg/dL    Comment: Performed at [facility], [address] [place], [place] [address]  Osmolality     Status: None   Collection Time: 05/29/19  4:55 PM  Result Value Ref Range   Osmolality 287 275 - 295 mOsm/kg    Comment: Performed at [facility] [address], [place], [place] [address]  Ethanol     Status: None   Collection Time: 05/29/19  5:07 PM  Result Value Ref Range   Alcohol, Ethyl (B) <10 <10 mg/dL    Comment: (NOTE) Lowest detectable limit for serum alcohol is 10 mg/dL. For medical purposes only. Performed at [facility], [address] [place], [place] [address]   Blood gas, arterial (at [facility])     Status: Abnormal   Collection Time: 05/29/19  5:40 PM  Result Value Ref Range   FIO2 21.00    pH, Arterial 7.349 (L) 7.350 - 7.450   pCO2 arterial 35.5 32.0 - 48.0 mmHg   pO2, Arterial 44.7 (L) 83.0 - 108.0 mmHg   Bicarbonate 19.7 (L) 20.0 - 28.0 mmol/L   Acid-base deficit 5.6 (H) 0.0  - 2.0 mmol/L   O2 Saturation 81.1 %   Patient temperature 36.8    Allens test (pass/fail) PASS PASS    Comment: Performed at [facility], [address] [place], [place] [address]  Urinalysis, Routine w reflex microscopic  Status: Abnormal   Collection Time: 05/29/19  8:46 PM  Result Value Ref Range   Color, Urine STRAW (A) YELLOW   APPearance CLEAR CLEAR   Specific Gravity, Urine 1.003 (L) 1.005 - 1.030   pH 6.0 5.0 - 8.0   Glucose, UA NEGATIVE NEGATIVE mg/dL   Hgb urine dipstick NEGATIVE NEGATIVE   Bilirubin Urine NEGATIVE NEGATIVE   Ketones, ur NEGATIVE NEGATIVE mg/dL   Protein, ur NEGATIVE NEGATIVE mg/dL   Nitrite NEGATIVE NEGATIVE   Leukocytes,Ua NEGATIVE NEGATIVE    Comment: Performed at [facility], [address] [place], [place] [address]  Rapid urine drug screen (hospital performed)     Status: None   Collection Time: 05/29/19  8:46 PM  Result Value Ref Range   Opiates NONE DETECTED NONE DETECTED   Cocaine NONE DETECTED NONE DETECTED   Benzodiazepines NONE DETECTED NONE DETECTED   Amphetamines NONE DETECTED NONE DETECTED   Tetrahydrocannabinol NONE DETECTED NONE DETECTED   Barbiturates NONE DETECTED NONE DETECTED    Comment: (NOTE) DRUG SCREEN FOR MEDICAL PURPOSES ONLY.  IF CONFIRMATION IS NEEDED FOR ANY PURPOSE, NOTIFY LAB WITHIN 5 DAYS. LOWEST DETECTABLE LIMITS FOR URINE DRUG SCREEN Drug Class                     Cutoff (ng/mL) Amphetamine and metabolites    1000 Barbiturate and metabolites    200 Benzodiazepine                 975 Tricyclics and metabolites     300 Opiates and metabolites        300 Cocaine and metabolites        300 THC                            50 Performed at [facility], [address], [place], [place] [address]   Lactic acid, plasma     Status: Abnormal   Collection Time: 05/29/19  8:54 PM  Result Value Ref Range   Lactic Acid, Venous >11.0 (HH) 0.5 - 1.9 mmol/L    Comment: CRITICAL RESULT CALLED TO, READ BACK BY AND VERIFIED  WITH: NICHOLS,K ON 05/29/19 AT 2125 BY LOY,C Performed at [facility], [address] [place], [place] [address]   Basic metabolic panel     Status: Abnormal   Collection Time: 05/29/19  8:54 PM  Result Value Ref Range   Sodium 131 (L) 135 - 145 mmol/L   Potassium 3.6 3.5 - 5.1 mmol/L   Chloride 101 98 - 111 mmol/L   CO2 17 (L) 22 - 32 mmol/L   Glucose, Bld 216 (H) 70 - 99 mg/dL   BUN 10 6 - 20 mg/dL   Creatinine, Ser 1.34 (H) 0.61 - 1.24 mg/dL   Calcium 8.6 (L) 8.9 - 10.3 mg/dL   GFR calc non Af Amer 58 (L) >60 mL/min   GFR calc Af Amer >60 >60 mL/min   Anion gap 13 5 - 15    Comment: Performed at [facility], [address], [place], [place] [address]  Lactic acid, plasma     Status: Abnormal   Collection Time: 05/29/19 11:15 PM  Result Value Ref Range   Lactic Acid, Venous 2.7 (HH) 0.5 - 1.9 mmol/L    Comment: CRITICAL RESULT CALLED TO, READ BACK BY AND VERIFIED WITH: B Robynne Roat,RN @0044  05/30/19 [facility] Performed at [facility], [address], [place], [place] [address]  No results found.  Pending Labs Unresulted Labs (From admission, onward)    Start     Ordered   05/29/19 2253  Lactic acid, plasma  Now then every 2 hours,   STAT     05/29/19 2252   05/29/19 2042  SARS CORONAVIRUS 2 Nasal Swab Aptima Multi Swab  (Asymptomatic/Tier 2 Patients Labs)  Once,   STAT    Question Answer Comment  Is this test for diagnosis or screening Screening   Symptomatic for COVID-19 as defined by CDC No   Hospitalized for COVID-19 No   Admitted to ICU for COVID-19 No   Previously tested for COVID-19 Yes   Resident in a congregate (group) care setting No   Employed in healthcare setting No      05/29/19 2041   05/29/19 1655  Vitamin B6  Once,   STAT     05/29/19 1654   05/29/19 1653  Vitamin B1  Once,   STAT     05/29/19 1653   05/29/19 1648  Volatiles,Blood (acetone,ethanol,isoprop,methanol)  Once,   STAT     05/29/19 1649   05/29/19 1648  Ethylene glycol  Once,   STAT      05/29/19 1649          Vitals/Pain Today's Vitals   05/29/19 1901 05/29/19 1935 05/29/19 1936 05/29/19 2053  BP:  (!) 178/93 (!) 151/95 (!) 165/80  Pulse:  71 67   Resp:      Temp:      TempSrc:      SpO2:  97% 100%   Weight:      Height:      PainSc: 5        Isolation Precautions No active isolations  Medications Medications  pyridOXINE (B-6) injection 100 mg (100 mg Intravenous Given 05/29/19 1833)  sodium chloride 0.9 % bolus 1,000 mL (1,000 mLs Intravenous Refused 05/29/19 2101)  fomepizole (ANTIZOL) 1,000 mg in sodium chloride 0.9 % 100 mL IVPB (has no administration in time range)  thiamine (B-1) injection 100 mg (100 mg Intravenous Given 05/29/19 1837)  fomepizole (ANTIZOL) 1,490 mg in sodium chloride 0.9 % 100 mL IVPB (0 mg Intravenous Stopped 05/29/19 1906)  sodium chloride 0.9 % bolus 1,000 mL (0 mLs Intravenous Stopped 05/29/19 1906)    Mobility walks     Focused Assessments    R Recommendations: See Admitting Provider Note  Report given to:   Additional Notes:

## 2019-05-30 NOTE — Progress Notes (Addendum)
Order placed for Antizol placed again based on new lab results. Poison control was contacted and they stated they were going to call back. MD aware. Will d/c transfer order and continue to monitor.

## 2019-05-31 ENCOUNTER — Other Ambulatory Visit: Payer: Self-pay | Admitting: Pulmonary Disease

## 2019-05-31 DIAGNOSIS — T1491XA Suicide attempt, initial encounter: Secondary | ICD-10-CM

## 2019-05-31 LAB — CBC WITH DIFFERENTIAL/PLATELET
Abs Immature Granulocytes: 0.02 10*3/uL (ref 0.00–0.07)
Basophils Absolute: 0 10*3/uL (ref 0.0–0.1)
Basophils Relative: 1 %
Eosinophils Absolute: 0.2 10*3/uL (ref 0.0–0.5)
Eosinophils Relative: 5 %
HCT: 37.2 % — ABNORMAL LOW (ref 39.0–52.0)
Hemoglobin: 13 g/dL (ref 13.0–17.0)
Immature Granulocytes: 1 %
Lymphocytes Relative: 34 %
Lymphs Abs: 1.3 10*3/uL (ref 0.7–4.0)
MCH: 30.3 pg (ref 26.0–34.0)
MCHC: 34.9 g/dL (ref 30.0–36.0)
MCV: 86.7 fL (ref 80.0–100.0)
Monocytes Absolute: 0.5 10*3/uL (ref 0.1–1.0)
Monocytes Relative: 12 %
Neutro Abs: 1.9 10*3/uL (ref 1.7–7.7)
Neutrophils Relative %: 47 %
Platelets: 125 10*3/uL — ABNORMAL LOW (ref 150–400)
RBC: 4.29 MIL/uL (ref 4.22–5.81)
RDW: 14.5 % (ref 11.5–15.5)
WBC: 3.9 10*3/uL — ABNORMAL LOW (ref 4.0–10.5)
nRBC: 0 % (ref 0.0–0.2)

## 2019-05-31 LAB — BASIC METABOLIC PANEL
Anion gap: 9 (ref 5–15)
BUN: 9 mg/dL (ref 6–20)
CO2: 22 mmol/L (ref 22–32)
Calcium: 8.7 mg/dL — ABNORMAL LOW (ref 8.9–10.3)
Chloride: 104 mmol/L (ref 98–111)
Creatinine, Ser: 1.2 mg/dL (ref 0.61–1.24)
GFR calc Af Amer: >60 mL/min (ref >60)
GFR calc non Af Amer: >60 mL/min (ref >60)
Glucose, Bld: 163 mg/dL — ABNORMAL HIGH (ref 70–99)
Potassium: 3.7 mmol/L (ref 3.5–5.1)
Sodium: 135 mmol/L (ref 135–145)

## 2019-05-31 LAB — GLUCOSE, CAPILLARY
Glucose-Capillary: 162 mg/dL — ABNORMAL HIGH (ref 70–99)
Glucose-Capillary: 216 mg/dL — ABNORMAL HIGH (ref 70–99)
Glucose-Capillary: 245 mg/dL — ABNORMAL HIGH (ref 70–99)
Glucose-Capillary: 229 mg/dL — ABNORMAL HIGH (ref 70–99)
Glucose-Capillary: 227 mg/dL — ABNORMAL HIGH (ref 70–99)

## 2019-05-31 LAB — ETHYLENE GLYCOL: Ethylene Glycol Lvl: 18 mg/dL — ABNORMAL HIGH

## 2019-05-31 LAB — VITAMIN B6: Vitamin B6: 12.8 ug/L (ref 5.3–46.7)

## 2019-05-31 LAB — VITAMIN B1: Vitamin B1 (Thiamine): 98.7 nmol/L (ref 66.5–200.0)

## 2019-05-31 MED ORDER — LORAZEPAM 1 MG PO TABS
1.0000 mg | ORAL_TABLET | Freq: Two times a day (BID) | ORAL | Status: DC
  Administered 2019-05-31 – 2019-06-07 (×14): 1 mg via ORAL
  Filled 2019-05-31 (×14): qty 1

## 2019-05-31 MED ORDER — LORAZEPAM 2 MG/ML IJ SOLN
1.0000 mg | INTRAMUSCULAR | Status: DC
  Administered 2019-05-31: 1 mg via INTRAVENOUS
  Filled 2019-05-31: qty 1

## 2019-05-31 MED ORDER — SODIUM CHLORIDE 0.9 % IV SOLN
10.0000 mg/kg | Freq: Two times a day (BID) | INTRAVENOUS | Status: DC
  Administered 2019-05-31: 880 mg via INTRAVENOUS
  Filled 2019-05-31 (×2): qty 0.88

## 2019-05-31 MED ORDER — VITAMIN B-1 100 MG PO TABS
100.0000 mg | ORAL_TABLET | Freq: Every day | ORAL | Status: DC
  Administered 2019-05-31 – 2019-06-07 (×7): 100 mg via ORAL
  Filled 2019-05-31 (×8): qty 1

## 2019-05-31 MED ORDER — OXYCODONE-ACETAMINOPHEN 7.5-325 MG PO TABS
2.0000 | ORAL_TABLET | Freq: Four times a day (QID) | ORAL | Status: DC | PRN
  Administered 2019-05-31 – 2019-06-07 (×20): 2 via ORAL
  Filled 2019-05-31 (×21): qty 2

## 2019-05-31 MED ORDER — BACLOFEN 10 MG PO TABS
10.0000 mg | ORAL_TABLET | Freq: Three times a day (TID) | ORAL | Status: DC
  Administered 2019-05-31 – 2019-06-06 (×8): 10 mg via ORAL
  Filled 2019-05-31 (×14): qty 1

## 2019-05-31 MED ORDER — FOLIC ACID 1 MG PO TABS
1.0000 mg | ORAL_TABLET | Freq: Every day | ORAL | Status: DC
  Administered 2019-05-31 – 2019-06-07 (×7): 1 mg via ORAL
  Filled 2019-05-31 (×8): qty 1

## 2019-05-31 MED ORDER — HYDRALAZINE HCL 20 MG/ML IJ SOLN
20.0000 mg | INTRAMUSCULAR | Status: DC | PRN
  Administered 2019-05-31: 20 mg via INTRAVENOUS
  Filled 2019-05-31 (×2): qty 1

## 2019-05-31 MED ORDER — AMLODIPINE BESYLATE 5 MG PO TABS
5.0000 mg | ORAL_TABLET | Freq: Every day | ORAL | Status: DC
  Administered 2019-05-31 – 2019-06-01 (×2): 5 mg via ORAL
  Filled 2019-05-31 (×4): qty 1

## 2019-05-31 MED ORDER — LORAZEPAM 2 MG/ML IJ SOLN
1.0000 mg | INTRAMUSCULAR | Status: DC | PRN
  Administered 2019-05-31 – 2019-06-06 (×18): 1 mg via INTRAVENOUS
  Filled 2019-05-31 (×20): qty 1

## 2019-05-31 NOTE — Progress Notes (Signed)
Blood pressure remains uncontrolled   Apresoline as needed for systolic greater than 549  Will start on a calcium channel blocker-Norvasc 5 mg daily

## 2019-05-31 NOTE — Progress Notes (Signed)
Pt w/ significant disorientation, unable to redirect over coarse of day. Restraints initiated when repeated attempts to leave bed and violent verbal and physical outburst. Patient displays paranoid delusions, hallucinations, disoriented and with only limited transient orientation (< 45 min oriented total).

## 2019-05-31 NOTE — Progress Notes (Addendum)
NAME:  [name], MRN:  [id number], DOB:  September 18, 1961, LOS: 1 ADMISSION DATE:  05/29/2019, CONSULTATION DATE:  05/30/2019 REFERRING MD:  ED physician, CHIEF COMPLAINT: Ethylene glycol ingestion  Brief History   58 year old gentleman transferred from [place] pain after drinking roughly 10 ounces of antifreeze in a suicide attempt. Is stabilizing Complaining of a lot of back pain  History of present illness   Minas L Meter is a 58 y.o. male who has a PMH including but not limited to Substance abuse, shizoaffective disorder, MI, HTN, cardiac arrest, COPD, DM (see "past medical history" for rest).  He presented to [facility] ED 8/11 after drinking roughly 10oz of antifreeze as part of a suicide attempt.  He called EMS because his stomach hurt and states that he has done the same in the past and forgot how much his stomach hurt afterwards.  In ED, he received fomepizole (loading dose - 15 mg/kg), thiamine, pyridoxine.  He was then transferred to [facility] for further evaluation and management.    UDS negative. Ethylene glycol -still elevated. Osmoles 287, osmolar gap 9, SCr 1.34,  ABG  7.35 / 35 / 45 / 5.6. ABG 8/12- 7.40/ 29/ 91  Past Medical History  History of substance abuse, schizoaffective disorder, MI, hypertension, cardiac arrest, COPD, diabetes.  Significant Hospital Events   Admitted 8/11  Consults:  Psychiatry consult-recommended inpatient psych  Procedures:  None  Significant Diagnostic Tests:  Ethylene glycol level elevated ABG-not acidemic  Micro Data:  SARS CoV2-negative  Antimicrobials:  None  Interim history/subjective:  Complaining of back and hip pain  Objective   Blood pressure (!) 150/78, pulse 70, temperature 98.2 F (36.8 C), temperature source Oral, resp. rate 16, height 6' (1.829 m), weight 88.4 kg, SpO2 95 %.        Intake/Output Summary (Last 24 hours) at 05/31/2019 0811 Last data filed at 05/31/2019 3833 Gross per 24 hour  Intake 4085.4 ml  Output 2250  ml  Net 1835.4 ml   Filed Weights   05/29/19 1655 05/30/19 0355  Weight: 99.6 kg 88.4 kg    Examination: General: Comfortable, does not appear in distress  HENT: Denies any visual complaints, no blurred vision  lungs: Clear breath sounds bilaterally Cardiovascular: S1-S2 appreciated Abdomen: Nontender Extremities: No edema Neuro: Following commands, moving all extremities GU:   Resolved Hospital Problem list     Assessment & Plan:  Suicide attempt-ethylene glycol ingestion -On fomepizole, ethylene glycol level still elevated -Follow ethylene glycol levels -Continue thiamine/folate/pyridoxine -Suicide precautions -For inpatient psych  Acute kidney injury -Continue fluid resuscitation -Trend BMP  Hypertension, hyperlipidemia -Continue atorvastatin, Imdur, Toprol  Diabetes -SSI -Hold metformin, continue NovoLog  History of schizoaffective disorder, chronic back pain -Increase Percocet to 7.5-2 tabs as needed  Active smoker -Smoking cessation counseling -Nicotine patch   Best practice:  Diet: regular Pain/Anxiety/Delirium protocol (if indicated): Percocet VAP protocol (if indicated): Not indicated DVT prophylaxis: Enoxaparin Glucose control: SSI Mobility: Bedrest Code Status: Full code Family Communication: Pending Disposition: ICU monitoring  Labs   CBC: Recent Labs  Lab 05/26/19 1828 05/30/19 0535 05/30/19 0718 05/30/19 1301 05/31/19 0308  WBC 4.7  --  5.8 4.4 3.9*  NEUTROABS 2.7  --   --   --  1.9  HGB 14.8 14.3 14.5 13.9 13.0  HCT 43.8 42.0 41.3 39.2 37.2*  MCV 89.0  --  87.5 86.7 86.7  PLT 164  --  137* 140* 125*    Basic Metabolic Panel: Recent Labs  Lab 05/26/19  1828 05/29/19 1654 05/29/19 2054 05/30/19 0535 05/30/19 0718 05/30/19 1301 05/31/19 0308  NA 134* 127* 131* 135 135  --  135  K 4.2 3.6 3.6 4.0 4.2  --  3.7  CL 102 97* 101  --  106  --  104  CO2 24 16* 17*  --  19*  --  22  GLUCOSE 205* 277* 216*  --  203*  --  163*   BUN 13 11 10   --  10  --  9  CREATININE 1.16 1.29* 1.34*  --  1.27* 1.42* 1.20  CALCIUM 9.6 8.7* 8.6*  --  9.2  --  8.7*  MG  --  1.8  --   --  1.8  --   --   PHOS  --  4.0  --   --  3.5  --   --    GFR: Estimated Creatinine Clearance: 73.6 mL/min (by C-G formula based on SCr of 1.2 mg/dL). Recent Labs  Lab 05/26/19 1828 05/29/19 1654 05/29/19 2054 05/29/19 2315 05/30/19 0155 05/30/19 0718 05/30/19 1301 05/31/19 0308  WBC 4.7  --   --   --   --  5.8 4.4 3.9*  LATICACIDVEN  --  >11.0* >11.0* 2.7* 1.8  --   --   --     Liver Function Tests: Recent Labs  Lab 05/26/19 1828 05/29/19 1654  AST 16 16  ALT 20 15  ALKPHOS 76 63  BILITOT 0.7 0.7  PROT 7.3 6.7  ALBUMIN 4.5 4.0   No results for input(s): LIPASE, AMYLASE in the last 168 hours. No results for input(s): AMMONIA in the last 168 hours.  ABG    Component Value Date/Time   PHART 7.401 05/30/2019 0535   PCO2ART 29.2 (L) 05/30/2019 0535   PO2ART 91.0 05/30/2019 0535   HCO3 18.1 (L) 05/30/2019 0535   TCO2 19 (L) 05/30/2019 0535   ACIDBASEDEF 5.0 (H) 05/30/2019 0535   O2SAT 97.0 05/30/2019 0535     Coagulation Profile: No results for input(s): INR, PROTIME in the last 168 hours.  Cardiac Enzymes: No results for input(s): CKTOTAL, CKMB, CKMBINDEX, TROPONINI in the last 168 hours.  HbA1C: HB A1C (BAYER DCA - WAIVED)  Date/Time Value Ref Range Status  11/11/2016 11:32 AM 7.6 (H) <7.0 % Final    Comment:                                          Diabetic Adult            <7.0                                       Healthy Adult        4.3 - 5.7                                                           (DCCT/NGSP) American Diabetes Association's Summary of Glycemic Recommendations for Adults with Diabetes: Hemoglobin A1c <7.0%. More stringent glycemic goals (A1c <6.0%) may further reduce complications at the cost of increased risk of hypoglycemia.    Hgb A1c MFr  Bld  Date/Time Value Ref Range Status   05/03/2019 09:40 AM 8.8 (H) 4.8 - 5.6 % Final    Comment:    (NOTE) Pre diabetes:          5.7%-6.4% Diabetes:              >6.4% Glycemic control for   <7.0% adults with diabetes   04/13/2018 10:29 PM 10.7 (H) 4.8 - 5.6 % Final    Comment:    (NOTE) Pre diabetes:          5.7%-6.4% Diabetes:              >6.4% Glycemic control for   <7.0% adults with diabetes     CBG: Recent Labs  Lab 05/30/19 1605 05/30/19 1916 05/30/19 2311 05/31/19 0340 05/31/19 0746  GLUCAP 213* 217* 232* 162* 216*    Review of Systems:   Review of Systems  Constitutional: Negative for malaise/fatigue.  HENT: Negative.   Eyes: Negative.   Cardiovascular: Negative.   Musculoskeletal: Positive for back pain and joint pain.  Skin: Negative.   Psychiatric/Behavioral: Positive for substance abuse.   Past Medical History  He,  has a past medical history of Alcohol dependence ([place]) (1979), Back pain, Benzodiazepine dependence ([place]), Cardiac arrest ([place]), Chronic back pain, Cocaine abuse ([place]), COPD (chronic obstructive pulmonary disease) ([place]), Diabetes mellitus without complication ([place]) (3734), Hypertension (2008), MI (myocardial infarction) ([place]), Non-compliance, Schizoaffective disorder ([place]) (2006 ), and Substance abuse ([place]).   Surgical History    Past Surgical History:  Procedure Laterality Date  . CYST EXCISION       Social History   reports that he has been smoking cigarettes. He has been smoking about 1.50 packs per day. He has never used smokeless tobacco. He reports current alcohol use. He reports previous drug use. Drug: Cocaine.   Family History   His family history includes Diabetes in his maternal grandmother; Heart disease in his father, maternal grandmother, and mother.   Allergies Allergies  Allergen Reactions  . Quetiapine Anaphylaxis  . Seroquel [Quetiapine Fumarate] Anxiety and Other (See Comments)    Reaction:  Nightmares   . Trazodone Anaphylaxis  . Trazodone And  Nefazodone Anxiety and Other (See Comments)    Other reaction(s): Unknown Nightmares Reaction:  Nightmares   . Vistaril [Hydroxyzine Hcl] Nausea And Vomiting  . Haldol [Haloperidol Lactate] Anxiety and Other (See Comments)    Reaction:  Nightmares     The patient is critically ill with multiple organ system failure and requires high complexity decision making for assessment and support, frequent evaluation and titration of therapies, advanced monitoring, review of radiographic studies and interpretation of complex data.    Critical Care Time devoted to patient care services, exclusive of separately billable procedures, described in this note is 31 minutes.  Sherrilyn Rist, MD [place], PCCM Cell: [phone]

## 2019-05-31 NOTE — Progress Notes (Signed)
Inpatient Diabetes Program Recommendations  AACE/ADA: New Consensus Statement on Inpatient Glycemic Control (2015)  Target Ranges:  Prepandial:   less than 140 mg/dL      Peak postprandial:   less than 180 mg/dL (1-2 hours)      Critically ill patients:  140 - 180 mg/dL   Lab Results  Component Value Date   GLUCAP 216 (H) 05/31/2019   HGBA1C 8.8 (H) 05/03/2019    Review of Glycemic Control  FBS slightly elevated. Post-prandials > 180 mg/dL.  HgbA1C - 8.8% - sub-optimal control Eating 100%.  Inpatient Diabetes Program Recommendations:     Increase Levemir to 14 units QD Add Novolog 3 units tidwc for meal coverage insulin if pt eats > 50% meal.  Continue to follow.  Thank you. Lorenda Peck, RD, LDN, CDE Inpatient Diabetes Coordinator [phone]

## 2019-06-01 LAB — GLUCOSE, CAPILLARY
Glucose-Capillary: 171 mg/dL — ABNORMAL HIGH (ref 70–99)
Glucose-Capillary: 174 mg/dL — ABNORMAL HIGH (ref 70–99)
Glucose-Capillary: 174 mg/dL — ABNORMAL HIGH (ref 70–99)
Glucose-Capillary: 186 mg/dL — ABNORMAL HIGH (ref 70–99)
Glucose-Capillary: 192 mg/dL — ABNORMAL HIGH (ref 70–99)
Glucose-Capillary: 204 mg/dL — ABNORMAL HIGH (ref 70–99)
Glucose-Capillary: 232 mg/dL — ABNORMAL HIGH (ref 70–99)

## 2019-06-01 LAB — ETHYLENE GLYCOL: Ethylene Glycol Lvl: 73 mg/dL — ABNORMAL HIGH

## 2019-06-01 LAB — VOLATILES,BLD-ACETONE,ETHANOL,ISOPROP,METHANOL
Acetone, blood: NEGATIVE % (ref 0.000–0.010)
Ethanol, blood: NEGATIVE % (ref 0.000–0.010)
Isopropanol, blood: NEGATIVE % (ref 0.000–0.010)
Methanol, blood: NEGATIVE % (ref 0.000–0.010)

## 2019-06-01 MED ORDER — AMLODIPINE BESYLATE 10 MG PO TABS
10.0000 mg | ORAL_TABLET | Freq: Every day | ORAL | Status: DC
  Administered 2019-06-02 – 2019-06-05 (×4): 10 mg via ORAL
  Filled 2019-06-01 (×6): qty 1

## 2019-06-01 MED ORDER — AMLODIPINE BESYLATE 10 MG PO TABS: 10.0000 mg | ORAL_TABLET | Freq: Every day | ORAL | Status: DC

## 2019-06-01 MED ORDER — INSULIN DETEMIR 100 UNIT/ML ~~LOC~~ SOLN
20.0000 [IU] | Freq: Every day | SUBCUTANEOUS | Status: DC
  Administered 2019-06-02 – 2019-06-06 (×5): 20 [IU] via SUBCUTANEOUS
  Filled 2019-06-01 (×6): qty 0.2

## 2019-06-01 MED ORDER — INSULIN DETEMIR 100 UNIT/ML ~~LOC~~ SOLN
10.0000 [IU] | Freq: Once | SUBCUTANEOUS | Status: AC
  Administered 2019-06-01: 10 [IU] via SUBCUTANEOUS
  Filled 2019-06-01: qty 0.1

## 2019-06-01 MED ORDER — AMLODIPINE BESYLATE 5 MG PO TABS
5.0000 mg | ORAL_TABLET | Freq: Once | ORAL | Status: AC
  Administered 2019-06-01: 5 mg via ORAL
  Filled 2019-06-01: qty 1

## 2019-06-01 NOTE — Progress Notes (Signed)
Patient is clinically stable to be discharged to inpatient psych unit  Laboratory data of improved No clinical findings suggesting harm to his kidneys from ethylene glycol ingestion Blood pressure controlled Other chronic health problems are stable at present

## 2019-06-01 NOTE — TOC Initial Note (Signed)
Transition of Care [facility]) - Initial/Assessment Note    Patient Details  Name: [name] MRN: [id number] Date of Birth: 04-28-1961  Transition of Care [facility]) CM/SW Contact:    Bartholomew Crews, RN Phone Number: [phone] 06/01/2019, 10:41 AM  Clinical Narrative:                 Spoke with patient at bedside. PTA lived in a boarding house, but states he is on waiting list for [facility] in [place]. Patient self reports difficulty remembering to take his medications. Pharmacy is [facility] - medications are delivered. Does not drive. No HH or DME. Has niece who is family support - Ferol Luz [phone]. States she does not drive either.   Discussed recommendations to go to inpatient psych. Patient reports that he wants to go home, but is agreeable to the recommendation. Consent reviewed and patient signed. Referral to [facility] pending.   CM following for transition of care needs.   Expected Discharge Plan: Psychiatric Hospital Barriers to Discharge: Psych Bed not available   Patient Goals and CMS Choice Patient states their goals for this hospitalization and ongoing recovery are:: move into a rest home - [facility] in [place] once a room is available CMS Medicare.gov Compare Post Acute Care list provided to:: Patient Choice offered to / list presented to : Patient  Expected Discharge Plan and Services Expected Discharge Plan: [facility] In-house Referral: Clinical Social Work, [facility] Discharge Planning Services: CM Consult   Living arrangements for the past 2 months: [place]                 DME Arranged: N/A DME Agency: NA       HH Arranged: NA [place] Agency: NA        Prior Living Arrangements/Services Living arrangements for the past 2 months: [place] with:: Self Patient language and need for interpreter reviewed:: Yes              Criminal Activity/Legal Involvement Pertinent to Current Situation/Hospitalization: No - Comment as  needed  Activities of Daily Living      Permission Sought/Granted Permission sought to share information with : Family Supports(niece - Ferol Luz) Permission granted to share information with : Yes, Verbal Permission Granted  Share Information with NAME: Ferol Luz     Permission granted to share info w Relationship: niece  Permission granted to share info w Contact Information: [phone]  Emotional Assessment Appearance:: Appears older than stated age Attitude/Demeanor/Rapport: Engaged Affect (typically observed): Accepting Orientation: : Oriented to Self, Oriented to Place, Oriented to  Time, Oriented to Situation   Psych Involvement: Yes (comment)  Admission diagnosis:  Poisoning by antifreeze, intentional self-harm, initial encounter [facility]) [T65.92XA] Patient Active Problem List   Diagnosis Date Noted  . Suicide attempt ([place])   . Overdose 04/14/2018  . MDD (major depressive disorder) 11/12/2017  . MDD (major depressive disorder), recurrent severe, without psychosis ([place]) 10/12/2017  . Diabetic neuropathy ([place]) 11/11/2016  . Anxiety and depression 11/11/2016  . Chronic insomnia 01/08/2016  . Alcohol use disorder 12/10/2015  . Benzodiazepine abuse ([place]) 12/10/2015  . Opioid abuse ([place]) 12/10/2015  . Constipation 02/06/2015  . BPH (benign prostatic hyperplasia) 01/16/2015  . Vitamin D deficiency 01/16/2015  . Allergic rhinitis 11/19/2014  . Chronic low back pain 11/07/2014  . Onychomycosis of toenail 11/07/2014  . Tinea pedis 11/07/2014  . Psoriasis 10/14/2014  . HTN (hypertension) 10/14/2014  . Diabetes mellitus type 2, controlled ([place]) 10/14/2014  .  COPD (chronic obstructive pulmonary disease) ([place]) 10/14/2014  . Tobacco abuse 10/14/2014  . HLD (hyperlipidemia) 10/14/2014  . Schizoaffective disorder, depressive type ([place]) 10/14/2014   PCP:  Celene Squibb, MD Pharmacy:   [facility], [place] [address] [place]  [address] Phone: [phone] Fax: [phone]     Social Determinants of Health (SDOH) Interventions    Readmission Risk Interventions No flowsheet data found.

## 2019-06-01 NOTE — Progress Notes (Signed)
Wasted 1 mg Ativan in sharps with Maudie Mercury, Therapist, sports.

## 2019-06-01 NOTE — TOC Progression Note (Signed)
Transition of Care [facility]) - Progression Note    Patient Details  Name: PINCHAS REITHER MRN: [id number] Date of Birth: 1961/07/07  Transition of Care [facility]) CM/SW Contact  Bartholomew Crews, RN Phone Number: [phone] 06/01/2019, 11:26 AM  Clinical Narrative:    Received call back from [facility] Patient is not medically stable for [facility] stating BP running too high and recent IV medications/IVF. [facility] will not have a bed for him today. [facility] states that they are very familiar with patient and recommend if patient wants to return home to reconsult psych for clearance. CM following for transition of care needs.    Expected Discharge Plan: Psychiatric Hospital Barriers to Discharge: Continued Medical Work up  Expected Discharge Plan and Services Expected Discharge Plan: [facility] In-house Referral: Clinical Social Work, [facility] Discharge Planning Services: CM Consult   Living arrangements for the past 2 months: [place]                 DME Arranged: N/A DME Agency: NA       HH Arranged: NA HH Agency: NA         Social Determinants of Health (SDOH) Interventions    Readmission Risk Interventions No flowsheet data found.

## 2019-06-01 NOTE — Progress Notes (Signed)
NAME:  ATA PECHA, MRN:  [id number], DOB:  May 05, 1961, LOS: 2 ADMISSION DATE:  05/29/2019, CONSULTATION DATE:  05/30/2019 REFERRING MD:  ED physician, CHIEF COMPLAINT: Ethylene glycol ingestion  Brief History   58 year old gentleman transferred from [place] pain after drinking roughly 10 ounces of antifreeze in a suicide attempt. He is stabilizing  Complaining of pain  History of present illness   Nameer L Kliethermes is a 58 y.o. male who has a PMH including but not limited to Substance abuse, shizoaffective disorder, MI, HTN, cardiac arrest, COPD, DM (see "past medical history" for rest).  He presented to [facility] ED 8/11 after drinking roughly 10oz of antifreeze as part of a suicide attempt.  He called EMS because his stomach hurt and states that he has done the same in the past and forgot how much his stomach hurt afterwards.  In ED, he received fomepizole (loading dose - 15 mg/kg), thiamine, pyridoxine.  He was then transferred to [facility] for further evaluation and management.    UDS negative. Ethylene glycol -still elevated. Osmoles 287, osmolar gap 9, SCr 1.34,  ABG  7.35 / 35 / 45 / 5.6. ABG 8/12- 7.40/ 29/ 91  Ethylene glycol level 18  Past Medical History  History of substance abuse, schizoaffective disorder, MI, hypertension, cardiac arrest, COPD, diabetes.  Significant Hospital Events   Admitted 8/11  Consults:  Psychiatry consult-recommended inpatient psych  Procedures:  None  Significant Diagnostic Tests:  Ethylene glycol level elevated ABG-not acidemic  Micro Data:  SARS CoV2-negative  Antimicrobials:  None  Interim history/subjective:  Complaining of back and hip pain No significant overnight events  Objective   Blood pressure (!) 170/88, pulse 80, temperature 97.8 F (36.6 C), resp. rate 11, height 6' (1.829 m), weight 88.4 kg, SpO2 95 %.        Intake/Output Summary (Last 24 hours) at 06/01/2019 8676 Last data filed at 06/01/2019 0600 Gross per 24 hour  Intake  3076.31 ml  Output 3275 ml  Net -198.69 ml   Filed Weights   05/29/19 1655 05/30/19 0355  Weight: 99.6 kg 88.4 kg    Examination: General: Comfortable, not in distress HENT: No visual lungs: Clear breath sounds  Cardiovascular: S1-S2 appreciated Abdomen: soft, nontender Extremities: No edema, no clubbing Neuro: Moving all extremities GU:   Resolved Hospital Problem list     Assessment & Plan:  Suicide attempt-ethylene glycol ingestion -Was on fomepizole -Continue thiamine/folate/pyridoxine -Suicide precautions -For inpatient psych -Was started on Saphris per psychiatry recommendation  Acute kidney injury -Continue fluid resuscitation -Trend BMP  Hypertension -Better controlled-on Norvasc -Atorvastatin, Imdur, Toprol  Diabetes -SSI -Continue NovoLog  History of schizoaffective disorder, chronic back pain -On Percocet 7.5, 2 tabs  Active smoker -Smoking cessation counseling -Nicotine patch  We will reach out to psychiatry for guidance regarding inpatient psych Transfer to medical floor if psych bed not available at present   Best practice:  Diet: regular Pain/Anxiety/Delirium protocol (if indicated): Percocet VAP protocol (if indicated): Not indicated DVT prophylaxis: Enoxaparin Glucose control: SSI Mobility: Bedrest Code Status: Full code Family Communication: Pending Disposition: ICU monitoring  Labs   CBC: Recent Labs  Lab 05/26/19 1828 05/30/19 0535 05/30/19 0718 05/30/19 1301 05/31/19 0308  WBC 4.7  --  5.8 4.4 3.9*  NEUTROABS 2.7  --   --   --  1.9  HGB 14.8 14.3 14.5 13.9 13.0  HCT 43.8 42.0 41.3 39.2 37.2*  MCV 89.0  --  87.5 86.7 86.7  PLT 164  --  137* 140* 125*    Basic Metabolic Panel: Recent Labs  Lab 05/26/19 1828 05/29/19 1654 05/29/19 2054 05/30/19 0535 05/30/19 0718 05/30/19 1301 05/31/19 0308  NA 134* 127* 131* 135 135  --  135  K 4.2 3.6 3.6 4.0 4.2  --  3.7  CL 102 97* 101  --  106  --  104  CO2 24 16* 17*   --  19*  --  22  GLUCOSE 205* 277* 216*  --  203*  --  163*  BUN 13 11 10   --  10  --  9  CREATININE 1.16 1.29* 1.34*  --  1.27* 1.42* 1.20  CALCIUM 9.6 8.7* 8.6*  --  9.2  --  8.7*  MG  --  1.8  --   --  1.8  --   --   PHOS  --  4.0  --   --  3.5  --   --    GFR: Estimated Creatinine Clearance: 73.6 mL/min (by C-G formula based on SCr of 1.2 mg/dL). Recent Labs  Lab 05/26/19 1828 05/29/19 1654 05/29/19 2054 05/29/19 2315 05/30/19 0155 05/30/19 0718 05/30/19 1301 05/31/19 0308  WBC 4.7  --   --   --   --  5.8 4.4 3.9*  LATICACIDVEN  --  >11.0* >11.0* 2.7* 1.8  --   --   --     Liver Function Tests: Recent Labs  Lab 05/26/19 1828 05/29/19 1654  AST 16 16  ALT 20 15  ALKPHOS 76 63  BILITOT 0.7 0.7  PROT 7.3 6.7  ALBUMIN 4.5 4.0   No results for input(s): LIPASE, AMYLASE in the last 168 hours. No results for input(s): AMMONIA in the last 168 hours.  ABG    Component Value Date/Time   PHART 7.401 05/30/2019 0535   PCO2ART 29.2 (L) 05/30/2019 0535   PO2ART 91.0 05/30/2019 0535   HCO3 18.1 (L) 05/30/2019 0535   TCO2 19 (L) 05/30/2019 0535   ACIDBASEDEF 5.0 (H) 05/30/2019 0535   O2SAT 97.0 05/30/2019 0535     Coagulation Profile: No results for input(s): INR, PROTIME in the last 168 hours.  Cardiac Enzymes: No results for input(s): CKTOTAL, CKMB, CKMBINDEX, TROPONINI in the last 168 hours.  HbA1C: HB A1C (BAYER DCA - WAIVED)  Date/Time Value Ref Range Status  11/11/2016 11:32 AM 7.6 (H) <7.0 % Final    Comment:                                          Diabetic Adult            <7.0                                       Healthy Adult        4.3 - 5.7                                                           (DCCT/NGSP) American Diabetes Association's Summary of Glycemic Recommendations for Adults with Diabetes: Hemoglobin A1c <7.0%. More stringent glycemic goals (A1c <6.0%) may further reduce complications  at the cost of increased risk of hypoglycemia.     Hgb A1c MFr Bld  Date/Time Value Ref Range Status  05/03/2019 09:40 AM 8.8 (H) 4.8 - 5.6 % Final    Comment:    (NOTE) Pre diabetes:          5.7%-6.4% Diabetes:              >6.4% Glycemic control for   <7.0% adults with diabetes   04/13/2018 10:29 PM 10.7 (H) 4.8 - 5.6 % Final    Comment:    (NOTE) Pre diabetes:          5.7%-6.4% Diabetes:              >6.4% Glycemic control for   <7.0% adults with diabetes     CBG: Recent Labs  Lab 05/31/19 1206 05/31/19 1602 05/31/19 1941 06/01/19 0113 06/01/19 0335  GLUCAP 245* 229* 227* 232* 204*    Review of Systems:   Review of Systems  Constitutional: Negative for malaise/fatigue.  HENT: Negative.   Eyes: Negative.   Cardiovascular: Negative.   Musculoskeletal: Positive for back pain and joint pain.  Skin: Negative.   Psychiatric/Behavioral: Positive for substance abuse.   Past Medical History  He,  has a past medical history of Alcohol dependence ([place]) (1979), Back pain, Benzodiazepine dependence ([place]), Cardiac arrest ([place]), Chronic back pain, Cocaine abuse ([place]), COPD (chronic obstructive pulmonary disease) ([place]), Diabetes mellitus without complication ([place]) (3094), Hypertension (2008), MI (myocardial infarction) ([place]), Non-compliance, Schizoaffective disorder ([place]) (2006 ), and Substance abuse ([place]).   Surgical History    Past Surgical History:  Procedure Laterality Date  . CYST EXCISION       Social History   reports that he has been smoking cigarettes. He has been smoking about 1.50 packs per day. He has never used smokeless tobacco. He reports current alcohol use. He reports previous drug use. Drug: Cocaine.   Family History   His family history includes Diabetes in his maternal grandmother; Heart disease in his father, maternal grandmother, and mother.   Allergies Allergies  Allergen Reactions  . Quetiapine Anaphylaxis  . Seroquel [Quetiapine Fumarate] Anxiety and Other (See Comments)    Reaction:   Nightmares   . Trazodone Anaphylaxis  . Trazodone And Nefazodone Anxiety and Other (See Comments)    Other reaction(s): Unknown Nightmares Reaction:  Nightmares   . Vistaril [Hydroxyzine Hcl] Nausea And Vomiting  . Haldol [Haloperidol Lactate] Anxiety and Other (See Comments)    Reaction:  Nightmares      Sherrilyn Rist, MD [place], PCCM Cell: [phone]

## 2019-06-01 NOTE — Consult Note (Signed)
   [facility] CM Inpatient Consult   06/01/2019  [name] 10/26/1960 [id number]   Thank you for this consult.  Patient evaluated for [name] services.  Patient is not currently a beneficiary of the attributed [name] in the [employer].  The patient's Primary Care Provider,Barrino, Link Snuffer, MD, [facility], is not a [facility] primary care provider or is not [facility] affiliated.   This patient is Not eligible for [facility] Care Management Services.  Spoke with [name], inpatient TOC and confirms PCP and made her aware provider is not in network.   Reason:  Not a beneficiary currently attributed to one of the [place].   Patient's primary care provider is not an in network provider at this time. Membership roster was used to verify non-eligible status.    [name], RN BSN [facility] Liaison  [phone] business mobile phone Toll free office [phone]  Fax number: [phone] [email or web address]

## 2019-06-01 NOTE — Progress Notes (Signed)
Attempted to call pt's niece Mendel Ryder per pt's request. Unfortunately, she did not answer. Will attempt to call her again if pt so desires.

## 2019-06-02 ENCOUNTER — Other Ambulatory Visit: Payer: Self-pay

## 2019-06-02 DIAGNOSIS — E1165 Type 2 diabetes mellitus with hyperglycemia: Secondary | ICD-10-CM

## 2019-06-02 DIAGNOSIS — I10 Essential (primary) hypertension: Secondary | ICD-10-CM

## 2019-06-02 LAB — GLUCOSE, CAPILLARY
Glucose-Capillary: 181 mg/dL — ABNORMAL HIGH (ref 70–99)
Glucose-Capillary: 178 mg/dL — ABNORMAL HIGH (ref 70–99)
Glucose-Capillary: 207 mg/dL — ABNORMAL HIGH (ref 70–99)
Glucose-Capillary: 143 mg/dL — ABNORMAL HIGH (ref 70–99)
Glucose-Capillary: 271 mg/dL — ABNORMAL HIGH (ref 70–99)

## 2019-06-02 MED ORDER — ATORVASTATIN CALCIUM 40 MG PO TABS: 40.0000 mg | ORAL_TABLET | Freq: Every day | ORAL | Status: DC

## 2019-06-02 MED ORDER — GABAPENTIN 400 MG PO CAPS: 800.0000 mg | ORAL_CAPSULE | Freq: Three times a day (TID) | ORAL | Status: DC

## 2019-06-02 MED ORDER — THIAMINE HCL 100 MG PO TABS: 100.0000 mg | ORAL_TABLET | Freq: Every day | ORAL | Status: DC

## 2019-06-02 MED ORDER — FOLIC ACID 1 MG PO TABS: 1.0000 mg | ORAL_TABLET | Freq: Every day | ORAL | Status: DC

## 2019-06-02 MED ORDER — SENNOSIDES-DOCUSATE SODIUM 8.6-50 MG PO TABS
1.0000 | ORAL_TABLET | Freq: Two times a day (BID) | ORAL | Status: DC
  Administered 2019-06-02 – 2019-06-07 (×11): 1 via ORAL
  Filled 2019-06-02 (×11): qty 1

## 2019-06-02 MED ORDER — ASENAPINE MALEATE 5 MG SL SUBL: 5.0000 mg | SUBLINGUAL_TABLET | Freq: Two times a day (BID) | SUBLINGUAL | Status: DC

## 2019-06-02 MED ORDER — METFORMIN HCL ER 500 MG PO TB24: 1000.0000 mg | ORAL_TABLET | Freq: Two times a day (BID) | ORAL | Status: DC

## 2019-06-02 MED ORDER — POLYETHYLENE GLYCOL 3350 17 G PO PACK: 17.0000 g | PACK | Freq: Every day | ORAL | Status: DC | PRN

## 2019-06-02 NOTE — Discharge Summary (Signed)
Physician Discharge Summary  [name] YSA:[id number] DOB: 1961/04/25 DOA: 05/29/2019  PCP: Celene Squibb, MD  Admit date: 05/29/2019 Discharge date: 06/02/2019  Admitted From: Home Disposition: [place]  Recommendations for Outpatient Follow-up:  Follow up with [facility] provider once bed is available   Home Health: No Equipment/Devices: None  Discharge Condition: Stable CODE STATUS: Full Diet recommendation: Heart healthy/carb modified  Brief/Interim Summary: 58 year old male with history of substance abuse, schizoaffective disorder, MI, hypertension, cardiac arrest, COPD, diabetes mellitus type 2 presented to [facility] ED on 05/29/2019 after drinking antifreeze as a part of suicide attempt.  He received fomepizole in the ED and was subsequently transferred to [facility].  He was admitted under PCCM service.  He was treated with IV fluids along with thiamine, folate and pyridoxine.  Psychiatry recommended inpatient psychiatric hospitalization once medically stable.  He was transferred out of ICU.  He has been hemodynamically stable and medically stable for transfer to [facility] once bed is available.  Discharge Diagnoses:   Suicidal attempt with ethylene glycol ingestion -Treated with fomepizole/IV fluids/thiamine/folate/pyridoxine -Currently medically and hemodynamically stable and transferred out of ICU. -Continue suicide precautions and one-to-one observation. -Psychiatry recommended inpatient psychiatric hospitalization and he was started on Saphris as per psychiatry recommendations. - He has been hemodynamically stable and medically stable for transfer to [facility] once bed is available.  Hypertension -Blood pressure on the high side but stable.  Continue home regimen.  Diabetes mellitus type 2 -Continue home regimen.  Outpatient follow-up  Tobacco abuse -Patient was counseled regarding tobacco cessation during the hospitalization by PCCM team.   Discharge Instructions  Discharge  Instructions    Diet - low sodium heart healthy   Complete by: As directed    Diet Carb Modified   Complete by: As directed    Increase activity slowly   Complete by: As directed      Allergies as of 06/02/2019      Reactions   Quetiapine Anaphylaxis   Seroquel [quetiapine Fumarate] Anxiety, Other (See Comments)   Reaction:  Nightmares    Trazodone Anaphylaxis   Trazodone And Nefazodone Anxiety, Other (See Comments)   Other reaction(s): Unknown Nightmares Reaction:  Nightmares    Vistaril [hydroxyzine Hcl] Nausea And Vomiting   Haldol [haloperidol Lactate] Anxiety, Other (See Comments)   Reaction:  Nightmares       Medication List    STOP taking these medications   ibuprofen 800 MG tablet Commonly known as: ADVIL     TAKE these medications   albuterol 108 (90 Base) MCG/ACT inhaler Commonly known as: VENTOLIN HFA Inhale 2 puffs into the lungs every 6 (six) hours as needed for wheezing or shortness of breath.   asenapine 5 MG Subl 24 hr tablet Commonly known as: SAPHRIS Place 1 tablet (5 mg total) under the tongue 2 (two) times daily.   aspirin EC 81 MG tablet Take 81 mg by mouth daily.   atorvastatin 40 MG tablet Commonly known as: LIPITOR Take 1 tablet (40 mg total) by mouth daily at 6 PM.   baclofen 10 MG tablet Commonly known as: LIORESAL Take 10 mg by mouth 3 (three) times daily.   budesonide-formoterol 80-4.5 MCG/ACT inhaler Commonly known as: SYMBICORT Inhale 2 puffs into the lungs 2 (two) times daily.   folic acid 1 MG tablet Commonly known as: FOLVITE Take 1 tablet (1 mg total) by mouth daily. Start taking on: June 03, 2019   gabapentin 400 MG capsule Commonly known as: NEURONTIN Take 2 capsules (  800 mg total) by mouth 3 (three) times daily.   hydrOXYzine 10 MG tablet Commonly known as: ATARAX/VISTARIL Take 10 mg by mouth 3 (three) times daily.   isosorbide mononitrate 30 MG 24 hr tablet Commonly known as: IMDUR Take 30 mg by mouth  daily.   Levemir 100 UNIT/ML injection Generic drug: insulin detemir Inject 20 Units into the skin at bedtime.   lisinopril 10 MG tablet Commonly known as: ZESTRIL Take 1 tablet by mouth daily.   metFORMIN 500 MG 24 hr tablet Commonly known as: GLUCOPHAGE-XR Take 2 tablets (1,000 mg total) by mouth 2 (two) times daily with a meal. For diabetes management   metoprolol succinate 25 MG 24 hr tablet Commonly known as: TOPROL-XL Take 25 mg by mouth 2 (two) times daily.   nitroGLYCERIN 0.4 MG SL tablet Commonly known as: NITROSTAT Place 0.4 mg under the tongue every 5 (five) minutes as needed for chest pain.   NovoLOG 100 UNIT/ML injection Generic drug: insulin aspart Inject 1-30 Units into the skin 3 (three) times daily with meals. SLIDING SCALE   omeprazole 20 MG capsule Commonly known as: PRILOSEC Take 20 mg by mouth daily as needed (heartburn).   paliperidone 234 MG/1.5ML Susp injection Commonly known as: INVEGA SUSTENNA Inject 234 mg into the muscle every 30 (thirty) days. (Due on 11-14-17): For mood control   thiamine 100 MG tablet Take 1 tablet (100 mg total) by mouth daily. Start taking on: June 03, 2019       Allergies  Allergen Reactions  . Quetiapine Anaphylaxis  . Seroquel [Quetiapine Fumarate] Anxiety and Other (See Comments)    Reaction:  Nightmares   . Trazodone Anaphylaxis  . Trazodone And Nefazodone Anxiety and Other (See Comments)    Other reaction(s): Unknown Nightmares Reaction:  Nightmares   . Vistaril [Hydroxyzine Hcl] Nausea And Vomiting  . Haldol [Haloperidol Lactate] Anxiety and Other (See Comments)    Reaction:  Nightmares     Consultations:  PCCM/psychiatry   Procedures/Studies: Dg Chest Portable 1 View  Result Date: 05/05/2019 CLINICAL DATA:  Chest pain. EXAM: PORTABLE CHEST 1 VIEW COMPARISON:  Chest x-rays dated 04/05/2019 and 03/27/2019. FINDINGS: Stable mild cardiomegaly. Lungs are clear. No pleural effusion or pneumothorax  seen. Osseous structures about the chest are unremarkable IMPRESSION: No active disease. No evidence of pneumonia or pulmonary edema. Electronically Signed   By: Franki Cabot M.D.   On: 05/05/2019 07:11     Subjective: Patient seen and examined at bedside.  He is sleepy, wakes up on calling his name.  Poor historian.  Denies any overnight fever or vomiting.  Discharge Exam: Vitals:   06/02/19 0816 06/02/19 1100  BP:  (!) 159/91  Pulse:  88  Resp:  18  Temp:  98.1 F (36.7 C)  SpO2: 94% 97%    General: Pt is sleepy, wakes up on calling his name.  Poor historian.  Answers some questions.   Cardiovascular: rate controlled, S1/S2 + Respiratory: bilateral decreased breath sounds at bases Abdominal: Soft, NT, ND, bowel sounds + Extremities: no edema, no cyanosis    The results of significant diagnostics from this hospitalization (including imaging, microbiology, ancillary and laboratory) are listed below for reference.     Microbiology: Recent Results (from the past 240 hour(s))  SARS CORONAVIRUS 2 Nasal Swab Aptima Multi Swab     Status: None   Collection Time: 05/29/19  8:42 PM   Specimen: Aptima Multi Swab; Nasal Swab  Result Value Ref Range Status   SARS  Coronavirus 2 NEGATIVE NEGATIVE Final    Comment: (NOTE) SARS-CoV-2 target nucleic acids are NOT DETECTED. The SARS-CoV-2 RNA is generally detectable in upper and lower respiratory specimens during the acute phase of infection. Negative results do not preclude SARS-CoV-2 infection, do not rule out co-infections with other pathogens, and should not be used as the sole basis for treatment or other patient management decisions. Negative results must be combined with clinical observations, patient history, and epidemiological information. The expected result is Negative. Fact Sheet for Patients: SugarRoll.be Fact Sheet for Healthcare Providers: https://www.woods-mathews.com/ This  test is not yet approved or cleared by the [place] FDA and  has been authorized for detection and/or diagnosis of SARS-CoV-2 by FDA under an Emergency Use Authorization (EUA). This EUA will remain  in effect (meaning this test can be used) for the duration of the COVID-19 declaration under Section 56 4(b)(1) of the Act, 21 U.S.C. section 360bbb-3(b)(1), unless the authorization is terminated or revoked sooner. Performed at [facility], [place] [address], [place], [place] [address]   MRSA PCR Screening     Status: None   Collection Time: 05/30/19  3:58 AM   Specimen: Nasal Mucosa; Nasopharyngeal  Result Value Ref Range Status   MRSA by PCR NEGATIVE NEGATIVE Final    Comment:        The GeneXpert MRSA Assay (FDA approved for NASAL specimens only), is one component of a comprehensive MRSA colonization surveillance program. It is not intended to diagnose MRSA infection nor to guide or monitor treatment for MRSA infections. Performed at [facility], [place] [address], [place], [place] [address]   SARS Coronavirus 2 [facility] order, Performed in [facility] hospital lab) Nasopharyngeal Nasopharyngeal Swab     Status: None   Collection Time: 05/30/19  4:12 AM   Specimen: Nasopharyngeal Swab  Result Value Ref Range Status   SARS Coronavirus 2 NEGATIVE NEGATIVE Final    Comment: (NOTE) If result is NEGATIVE SARS-CoV-2 target nucleic acids are NOT DETECTED. The SARS-CoV-2 RNA is generally detectable in upper and lower  respiratory specimens during the acute phase of infection. The lowest  concentration of SARS-CoV-2 viral copies this assay can detect is 250  copies / mL. A negative result does not preclude SARS-CoV-2 infection  and should not be used as the sole basis for treatment or other  patient management decisions.  A negative result may occur with  improper specimen collection / handling, submission of specimen other  than nasopharyngeal swab, presence of viral  mutation(s) within the  areas targeted by this assay, and inadequate number of viral copies  (<250 copies / mL). A negative result must be combined with clinical  observations, patient history, and epidemiological information. If result is POSITIVE SARS-CoV-2 target nucleic acids are DETECTED. The SARS-CoV-2 RNA is generally detectable in upper and lower  respiratory specimens dur ing the acute phase of infection.  Positive  results are indicative of active infection with SARS-CoV-2.  Clinical  correlation with patient history and other diagnostic information is  necessary to determine patient infection status.  Positive results do  not rule out bacterial infection or co-infection with other viruses. If result is PRESUMPTIVE POSTIVE SARS-CoV-2 nucleic acids MAY BE PRESENT.   A presumptive positive result was obtained on the submitted specimen  and confirmed on repeat testing.  While 2019 novel coronavirus  (SARS-CoV-2) nucleic acids may be present in the submitted sample  additional confirmatory testing may be necessary for epidemiological  and /  or clinical management purposes  to differentiate between  SARS-CoV-2 and other Sarbecovirus currently known to infect humans.  If clinically indicated additional testing with an alternate test  methodology [phone]) is advised. The SARS-CoV-2 RNA is generally  detectable in upper and lower respiratory sp ecimens during the acute  phase of infection. The expected result is Negative. Fact Sheet for Patients:  StrictlyIdeas.no Fact Sheet for Healthcare Providers: BankingDealers.co.za This test is not yet approved or cleared by the [place] FDA and has been authorized for detection and/or diagnosis of SARS-CoV-2 by FDA under an Emergency Use Authorization (EUA).  This EUA will remain in effect (meaning this test can be used) for the duration of the COVID-19 declaration under Section 564(b)(1)  of the Act, 21 U.S.C. section 360bbb-3(b)(1), unless the authorization is terminated or revoked sooner. Performed at [facility], [place] [address], [name], [place] [address]      Labs: BNP (last 3 results) No results for input(s): BNP in the last 8760 hours. Basic Metabolic Panel: Recent Labs  Lab 05/26/19 1828 05/29/19 1654 05/29/19 2054 05/30/19 0535 05/30/19 0718 05/30/19 1301 05/31/19 0308  NA 134* 127* 131* 135 135  --  135  K 4.2 3.6 3.6 4.0 4.2  --  3.7  CL 102 97* 101  --  106  --  104  CO2 24 16* 17*  --  19*  --  22  GLUCOSE 205* 277* 216*  --  203*  --  163*  BUN 13 11 10   --  10  --  9  CREATININE 1.16 1.29* 1.34*  --  1.27* 1.42* 1.20  CALCIUM 9.6 8.7* 8.6*  --  9.2  --  8.7*  MG  --  1.8  --   --  1.8  --   --   PHOS  --  4.0  --   --  3.5  --   --    Liver Function Tests: Recent Labs  Lab 05/26/19 1828 05/29/19 1654  AST 16 16  ALT 20 15  ALKPHOS 76 63  BILITOT 0.7 0.7  PROT 7.3 6.7  ALBUMIN 4.5 4.0   No results for input(s): LIPASE, AMYLASE in the last 168 hours. No results for input(s): AMMONIA in the last 168 hours. CBC: Recent Labs  Lab 05/26/19 1828 05/30/19 0535 05/30/19 0718 05/30/19 1301 05/31/19 0308  WBC 4.7  --  5.8 4.4 3.9*  NEUTROABS 2.7  --   --   --  1.9  HGB 14.8 14.3 14.5 13.9 13.0  HCT 43.8 42.0 41.3 39.2 37.2*  MCV 89.0  --  87.5 86.7 86.7  PLT 164  --  137* 140* 125*   Cardiac Enzymes: No results for input(s): CKTOTAL, CKMB, CKMBINDEX, TROPONINI in the last 168 hours. BNP: Invalid input(s): POCBNP CBG: Recent Labs  Lab 06/01/19 1948 06/01/19 2337 06/02/19 0452 06/02/19 0717 06/02/19 1134  GLUCAP 174* 186* 181* 178* 207*   D-Dimer No results for input(s): DDIMER in the last 72 hours. Hgb A1c No results for input(s): HGBA1C in the last 72 hours. Lipid Profile No results for input(s): CHOL, HDL, LDLCALC, TRIG, CHOLHDL, LDLDIRECT in the last 72 hours. Thyroid function studies No results for  input(s): TSH, T4TOTAL, T3FREE, THYROIDAB in the last 72 hours.  Invalid input(s): FREET3 Anemia work up No results for input(s): VITAMINB12, FOLATE, FERRITIN, TIBC, IRON, RETICCTPCT in the last 72 hours. Urinalysis    Component Value Date/Time   COLORURINE STRAW (A) 05/29/2019 2046  APPEARANCEUR CLEAR 05/29/2019 2046   LABSPEC 1.003 (L) 05/29/2019 2046   PHURINE 6.0 05/29/2019 2046   GLUCOSEU NEGATIVE 05/29/2019 2046   HGBUR NEGATIVE 05/29/2019 2046   BILIRUBINUR NEGATIVE 05/29/2019 2046   BILIRUBINUR neg 01/02/2016 1526   KETONESUR NEGATIVE 05/29/2019 2046   PROTEINUR NEGATIVE 05/29/2019 2046   UROBILINOGEN 0.2 01/02/2016 1526   UROBILINOGEN 0.2 07/27/2015 0322   NITRITE NEGATIVE 05/29/2019 2046   LEUKOCYTESUR NEGATIVE 05/29/2019 2046   Sepsis Labs Invalid input(s): PROCALCITONIN,  WBC,  LACTICIDVEN Microbiology Recent Results (from the past 240 hour(s))  SARS CORONAVIRUS 2 Nasal Swab Aptima Multi Swab     Status: None   Collection Time: 05/29/19  8:42 PM   Specimen: Aptima Multi Swab; Nasal Swab  Result Value Ref Range Status   SARS Coronavirus 2 NEGATIVE NEGATIVE Final    Comment: (NOTE) SARS-CoV-2 target nucleic acids are NOT DETECTED. The SARS-CoV-2 RNA is generally detectable in upper and lower respiratory specimens during the acute phase of infection. Negative results do not preclude SARS-CoV-2 infection, do not rule out co-infections with other pathogens, and should not be used as the sole basis for treatment or other patient management decisions. Negative results must be combined with clinical observations, patient history, and epidemiological information. The expected result is Negative. Fact Sheet for Patients: SugarRoll.be Fact Sheet for Healthcare Providers: https://www.woods-mathews.com/ This test is not yet approved or cleared by the [place] FDA and  has been authorized for detection and/or diagnosis of  SARS-CoV-2 by FDA under an Emergency Use Authorization (EUA). This EUA will remain  in effect (meaning this test can be used) for the duration of the COVID-19 declaration under Section 56 4(b)(1) of the Act, 21 U.S.C. section 360bbb-3(b)(1), unless the authorization is terminated or revoked sooner. Performed at [facility], [place] [address] [place], [place] [address]   MRSA PCR Screening     Status: None   Collection Time: 05/30/19  3:58 AM   Specimen: Nasal Mucosa; Nasopharyngeal  Result Value Ref Range Status   MRSA by PCR NEGATIVE NEGATIVE Final    Comment:        The GeneXpert MRSA Assay (FDA approved for NASAL specimens only), is one component of a comprehensive MRSA colonization surveillance program. It is not intended to diagnose MRSA infection nor to guide or monitor treatment for MRSA infections. Performed at [facility], [place] [address] [place], [place] [address]   SARS Coronavirus 2 [name] order, Performed in [facility] hospital lab) Nasopharyngeal Nasopharyngeal Swab     Status: None   Collection Time: 05/30/19  4:12 AM   Specimen: Nasopharyngeal Swab  Result Value Ref Range Status   SARS Coronavirus 2 NEGATIVE NEGATIVE Final    Comment: (NOTE) If result is NEGATIVE SARS-CoV-2 target nucleic acids are NOT DETECTED. The SARS-CoV-2 RNA is generally detectable in upper and lower  respiratory specimens during the acute phase of infection. The lowest  concentration of SARS-CoV-2 viral copies this assay can detect is 250  copies / mL. A negative result does not preclude SARS-CoV-2 infection  and should not be used as the sole basis for treatment or other  patient management decisions.  A negative result may occur with  improper specimen collection / handling, submission of specimen other  than nasopharyngeal swab, presence of viral mutation(s) within the  areas targeted by this assay, and inadequate number of viral copies  (<250 copies / mL). A  negative result must be combined with clinical  observations, patient  history, and epidemiological information. If result is POSITIVE SARS-CoV-2 target nucleic acids are DETECTED. The SARS-CoV-2 RNA is generally detectable in upper and lower  respiratory specimens dur ing the acute phase of infection.  Positive  results are indicative of active infection with SARS-CoV-2.  Clinical  correlation with patient history and other diagnostic information is  necessary to determine patient infection status.  Positive results do  not rule out bacterial infection or co-infection with other viruses. If result is PRESUMPTIVE POSTIVE SARS-CoV-2 nucleic acids MAY BE PRESENT.   A presumptive positive result was obtained on the submitted specimen  and confirmed on repeat testing.  While 2019 novel coronavirus  (SARS-CoV-2) nucleic acids may be present in the submitted sample  additional confirmatory testing may be necessary for epidemiological  and / or clinical management purposes  to differentiate between  SARS-CoV-2 and other Sarbecovirus currently known to infect humans.  If clinically indicated additional testing with an alternate test  methodology [phone]) is advised. The SARS-CoV-2 RNA is generally  detectable in upper and lower respiratory sp ecimens during the acute  phase of infection. The expected result is Negative. Fact Sheet for Patients:  StrictlyIdeas.no Fact Sheet for Healthcare Providers: BankingDealers.co.za This test is not yet approved or cleared by the [place] FDA and has been authorized for detection and/or diagnosis of SARS-CoV-2 by FDA under an Emergency Use Authorization (EUA).  This EUA will remain in effect (meaning this test can be used) for the duration of the COVID-19 declaration under Section 564(b)(1) of the Act, 21 U.S.C. section 360bbb-3(b)(1), unless the authorization is terminated or revoked sooner. Performed  at [facility], [place] [address], [place], [place] [address]      Time coordinating discharge: 35 minutes  SIGNED:   Aline August, MD  [facility] 06/02/2019, 12:55 PM

## 2019-06-02 NOTE — TOC Progression Note (Signed)
Transition of Care [facility]) - Progression Note    Patient Details  Name: NISHANT SCHRECENGOST MRN: [id number] Date of Birth: Sep 22, 1961  Transition of Care [facility]) CM/SW [place], [place] Phone Number: 06/02/2019, 12:50 PM  Clinical Narrative:     CSW received notice from Dr. Starla Link that the patient is stable enough for discharge to [facility].   CSW called Rolena Infante at [facility] disposition office. He stated that he would have to check with TTS to see if a bed was available. CSW is awaiting a return phone call.  Currently awaiting bed placement at [facility]. CSW will continue to follow.   Expected Discharge Plan: [facility] Barriers to Discharge: Continued Medical Work up  Expected Discharge Plan and Services Expected Discharge Plan: [facility] In-house Referral: Clinical Social Work, [facility] Discharge Planning Services: CM Consult   Living arrangements for the past 2 months: [place]                 DME Arranged: N/A DME Agency: NA       HH Arranged: NA HH Agency: NA         Social Determinants of Health (SDOH) Interventions    Readmission Risk Interventions No flowsheet data found.

## 2019-06-02 NOTE — Progress Notes (Signed)
Received patient from 17M to room 5M10 via wheelchair. 1:1 sitter at bedside.

## 2019-06-03 LAB — GLUCOSE, CAPILLARY
Glucose-Capillary: 177 mg/dL — ABNORMAL HIGH (ref 70–99)
Glucose-Capillary: 187 mg/dL — ABNORMAL HIGH (ref 70–99)
Glucose-Capillary: 197 mg/dL — ABNORMAL HIGH (ref 70–99)
Glucose-Capillary: 209 mg/dL — ABNORMAL HIGH (ref 70–99)
Glucose-Capillary: 211 mg/dL — ABNORMAL HIGH (ref 70–99)
Glucose-Capillary: 245 mg/dL — ABNORMAL HIGH (ref 70–99)

## 2019-06-03 MED ORDER — ONDANSETRON HCL 4 MG PO TABS
8.0000 mg | ORAL_TABLET | Freq: Three times a day (TID) | ORAL | Status: DC | PRN
  Administered 2019-06-03: 8 mg via ORAL
  Filled 2019-06-03: qty 2

## 2019-06-03 MED ORDER — INSULIN ASPART 100 UNIT/ML ~~LOC~~ SOLN
0.0000 [IU] | Freq: Three times a day (TID) | SUBCUTANEOUS | Status: DC
  Administered 2019-06-04: 3 [IU] via SUBCUTANEOUS
  Administered 2019-06-04 (×2): 5 [IU] via SUBCUTANEOUS
  Administered 2019-06-04 – 2019-06-05 (×4): 8 [IU] via SUBCUTANEOUS
  Administered 2019-06-06: 5 [IU] via SUBCUTANEOUS
  Administered 2019-06-06 (×2): 3 [IU] via SUBCUTANEOUS
  Administered 2019-06-06: 5 [IU] via SUBCUTANEOUS
  Administered 2019-06-07: 8 [IU] via SUBCUTANEOUS

## 2019-06-03 NOTE — Progress Notes (Signed)
CSW called [facility] and spoke with Chrys Racer. [facility] does not have any beds available today. There is a hall under renovation and they are at capacity. They are not expecting any discharges. CSW was instructed to call back Monday morning.   CSW will continue to follow and assist with discharge.   Domenic Schwab, MSW, [place] Worker [facility]  [phone]

## 2019-06-03 NOTE — Progress Notes (Signed)
Patient ID: [name], male   DOB: 1961/06/23, 58 y.o.   MRN: [id number] Patient is waiting for discharge to [facility].  He is medically stable for discharge.  Patient seen and examined at bedside.  Please refer to the discharge summary done by me on 06/02/2019 for full details.  Continue one-to-one observation.

## 2019-06-04 LAB — GLUCOSE, CAPILLARY
Glucose-Capillary: 221 mg/dL — ABNORMAL HIGH (ref 70–99)
Glucose-Capillary: 195 mg/dL — ABNORMAL HIGH (ref 70–99)
Glucose-Capillary: 252 mg/dL — ABNORMAL HIGH (ref 70–99)
Glucose-Capillary: 212 mg/dL — ABNORMAL HIGH (ref 70–99)

## 2019-06-04 NOTE — Discharge Summary (Signed)
Physician Discharge Summary  [name] XTG:[id number] DOB: 30-Nov-1960 DOA: 05/29/2019  PCP: Celene Squibb, MD  Admit date: 05/29/2019 Discharge date: 06/04/2019  Admitted From: Home Disposition: [place]  Recommendations for Outpatient Follow-up:  Follow up with [facility] provider once bed is available   Home Health: No Equipment/Devices: None  Discharge Condition: Stable CODE STATUS: Full Diet recommendation: Heart healthy/carb modified  Brief/Interim Summary: 58 year old male with history of substance abuse, schizoaffective disorder, MI, hypertension, cardiac arrest, COPD, diabetes mellitus type 2 presented to [facility] ED on 05/29/2019 after drinking antifreeze as a part of suicide attempt.  He received fomepizole in the ED and was subsequently transferred to [facility].  He was admitted under PCCM service.  He was treated with IV fluids along with thiamine, folate and pyridoxine.  Psychiatry recommended inpatient psychiatric hospitalization once medically stable.  He was transferred out of ICU.  He has been hemodynamically stable and medically stable for transfer to [facility] once bed is available.  Patient has been waiting for discharge to [facility] since 06/02/2019.  He is still currently hemodynamically and medically stable for discharge to [facility].  Discharge Diagnoses:   Suicidal attempt with ethylene glycol ingestion -Treated with fomepizole/IV fluids/thiamine/folate/pyridoxine -Currently medically and hemodynamically stable and transferred out of ICU. -Continue suicide precautions and one-to-one observation. -Psychiatry recommended inpatient psychiatric hospitalization and he was started on Saphris as per psychiatry recommendations. - He has been hemodynamically stable and medically stable for transfer to [facility] once bed is available.  Hypertension -Blood pressure  stable.  Continue home regimen.  Diabetes mellitus type 2 -Continue home regimen.  Outpatient follow-up  Tobacco abuse -Patient was  counseled regarding tobacco cessation during the hospitalization by PCCM team.   Discharge Instructions  Discharge Instructions    Diet - low sodium heart healthy   Complete by: As directed    Diet Carb Modified   Complete by: As directed    Increase activity slowly   Complete by: As directed      Allergies as of 06/04/2019      Reactions   Quetiapine Anaphylaxis   Seroquel [quetiapine Fumarate] Anxiety, Other (See Comments)   Reaction:  Nightmares    Trazodone Anaphylaxis   Trazodone And Nefazodone Anxiety, Other (See Comments)   Other reaction(s): Unknown Nightmares Reaction:  Nightmares    Vistaril [hydroxyzine Hcl] Nausea And Vomiting   Haldol [haloperidol Lactate] Anxiety, Other (See Comments)   Reaction:  Nightmares       Medication List    STOP taking these medications   ibuprofen 800 MG tablet Commonly known as: ADVIL     TAKE these medications   albuterol 108 (90 Base) MCG/ACT inhaler Commonly known as: VENTOLIN HFA Inhale 2 puffs into the lungs every 6 (six) hours as needed for wheezing or shortness of breath.   asenapine 5 MG Subl 24 hr tablet Commonly known as: SAPHRIS Place 1 tablet (5 mg total) under the tongue 2 (two) times daily.   aspirin EC 81 MG tablet Take 81 mg by mouth daily.   atorvastatin 40 MG tablet Commonly known as: LIPITOR Take 1 tablet (40 mg total) by mouth daily at 6 PM.   baclofen 10 MG tablet Commonly known as: LIORESAL Take 10 mg by mouth 3 (three) times daily.   budesonide-formoterol 80-4.5 MCG/ACT inhaler Commonly known as: SYMBICORT Inhale 2 puffs into the lungs 2 (two) times daily.   folic acid 1 MG tablet Commonly known as: FOLVITE Take 1 tablet (1 mg total) by mouth  daily.   gabapentin 400 MG capsule Commonly known as: NEURONTIN Take 2 capsules (800 mg total) by mouth 3 (three) times daily.   hydrOXYzine 10 MG tablet Commonly known as: ATARAX/VISTARIL Take 10 mg by mouth 3 (three) times daily.   isosorbide  mononitrate 30 MG 24 hr tablet Commonly known as: IMDUR Take 30 mg by mouth daily.   Levemir 100 UNIT/ML injection Generic drug: insulin detemir Inject 20 Units into the skin at bedtime.   lisinopril 10 MG tablet Commonly known as: ZESTRIL Take 1 tablet by mouth daily.   metFORMIN 500 MG 24 hr tablet Commonly known as: GLUCOPHAGE-XR Take 2 tablets (1,000 mg total) by mouth 2 (two) times daily with a meal. For diabetes management   metoprolol succinate 25 MG 24 hr tablet Commonly known as: TOPROL-XL Take 25 mg by mouth 2 (two) times daily.   nitroGLYCERIN 0.4 MG SL tablet Commonly known as: NITROSTAT Place 0.4 mg under the tongue every 5 (five) minutes as needed for chest pain.   NovoLOG 100 UNIT/ML injection Generic drug: insulin aspart Inject 1-30 Units into the skin 3 (three) times daily with meals. SLIDING SCALE   omeprazole 20 MG capsule Commonly known as: PRILOSEC Take 20 mg by mouth daily as needed (heartburn).   paliperidone 234 MG/1.5ML Susp injection Commonly known as: INVEGA SUSTENNA Inject 234 mg into the muscle every 30 (thirty) days. (Due on 11-14-17): For mood control   thiamine 100 MG tablet Take 1 tablet (100 mg total) by mouth daily.        Allergies  Allergen Reactions  . Quetiapine Anaphylaxis  . Seroquel [Quetiapine Fumarate] Anxiety and Other (See Comments)    Reaction:  Nightmares   . Trazodone Anaphylaxis  . Trazodone And Nefazodone Anxiety and Other (See Comments)    Other reaction(s): Unknown Nightmares Reaction:  Nightmares   . Vistaril [Hydroxyzine Hcl] Nausea And Vomiting  . Haldol [Haloperidol Lactate] Anxiety and Other (See Comments)    Reaction:  Nightmares     Consultations:  PCCM/psychiatry   Procedures/Studies: No results found.  Subjective: Patient seen and examined at bedside.  Sleepy, wakes up only very slightly, does not answer questions.  Had intermittent nausea overnight as per nursing staff.  No overnight  fever, chest pain or worsening shortness of breath.    Discharge Exam: Vitals:   06/03/19 1929 06/04/19 0609  BP: 124/61 124/79  Pulse: 64 74  Resp: 16 16  Temp: 97.8 F (36.6 C) 98.5 F (36.9 C)  SpO2: 94% 94%    General: Pt is sleepy, wakes up on calling his name.  Does not answer most questions.  Poor historian.     Cardiovascular: rate controlled, S1/S2 + Respiratory: bilateral decreased breath sounds at bases Abdominal: Soft, NT, ND, bowel sounds + Extremities: no edema, no cyanosis    The results of significant diagnostics from this hospitalization (including imaging, microbiology, ancillary and laboratory) are listed below for reference.     Microbiology: Recent Results (from the past 240 hour(s))  SARS CORONAVIRUS 2 Nasal Swab Aptima Multi Swab     Status: None   Collection Time: 05/29/19  8:42 PM   Specimen: Aptima Multi Swab; Nasal Swab  Result Value Ref Range Status   SARS Coronavirus 2 NEGATIVE NEGATIVE Final    Comment: (NOTE) SARS-CoV-2 target nucleic acids are NOT DETECTED. The SARS-CoV-2 RNA is generally detectable in upper and lower respiratory specimens during the acute phase of infection. Negative results do not preclude SARS-CoV-2 infection, do not  rule out co-infections with other pathogens, and should not be used as the sole basis for treatment or other patient management decisions. Negative results must be combined with clinical observations, patient history, and epidemiological information. The expected result is Negative. Fact Sheet for Patients: SugarRoll.be Fact Sheet for Healthcare Providers: https://www.woods-mathews.com/ This test is not yet approved or cleared by the [place] FDA and  has been authorized for detection and/or diagnosis of SARS-CoV-2 by FDA under an Emergency Use Authorization (EUA). This EUA will remain  in effect (meaning this test can be used) for the duration of  the COVID-19 declaration under Section 56 4(b)(1) of the Act, 21 U.S.C. section 360bbb-3(b)(1), unless the authorization is terminated or revoked sooner. Performed at [facility], [place] [address], [place], [place] [address]   MRSA PCR Screening     Status: None   Collection Time: 05/30/19  3:58 AM   Specimen: Nasal Mucosa; Nasopharyngeal  Result Value Ref Range Status   MRSA by PCR NEGATIVE NEGATIVE Final    Comment:        The GeneXpert MRSA Assay (FDA approved for NASAL specimens only), is one component of a comprehensive MRSA colonization surveillance program. It is not intended to diagnose MRSA infection nor to guide or monitor treatment for MRSA infections. Performed at [facility], [place] [address], [place], [place] [address]   SARS Coronavirus 2 [facility] order, Performed in [facility] hospital lab) Nasopharyngeal Nasopharyngeal Swab     Status: None   Collection Time: 05/30/19  4:12 AM   Specimen: Nasopharyngeal Swab  Result Value Ref Range Status   SARS Coronavirus 2 NEGATIVE NEGATIVE Final    Comment: (NOTE) If result is NEGATIVE SARS-CoV-2 target nucleic acids are NOT DETECTED. The SARS-CoV-2 RNA is generally detectable in upper and lower  respiratory specimens during the acute phase of infection. The lowest  concentration of SARS-CoV-2 viral copies this assay can detect is 250  copies / mL. A negative result does not preclude SARS-CoV-2 infection  and should not be used as the sole basis for treatment or other  patient management decisions.  A negative result may occur with  improper specimen collection / handling, submission of specimen other  than nasopharyngeal swab, presence of viral mutation(s) within the  areas targeted by this assay, and inadequate number of viral copies  (<250 copies / mL). A negative result must be combined with clinical  observations, patient history, and epidemiological information. If result is POSITIVE SARS-CoV-2  target nucleic acids are DETECTED. The SARS-CoV-2 RNA is generally detectable in upper and lower  respiratory specimens dur ing the acute phase of infection.  Positive  results are indicative of active infection with SARS-CoV-2.  Clinical  correlation with patient history and other diagnostic information is  necessary to determine patient infection status.  Positive results do  not rule out bacterial infection or co-infection with other viruses. If result is PRESUMPTIVE POSTIVE SARS-CoV-2 nucleic acids MAY BE PRESENT.   A presumptive positive result was obtained on the submitted specimen  and confirmed on repeat testing.  While 2019 novel coronavirus  (SARS-CoV-2) nucleic acids may be present in the submitted sample  additional confirmatory testing may be necessary for epidemiological  and / or clinical management purposes  to differentiate between  SARS-CoV-2 and other Sarbecovirus currently known to infect humans.  If clinically indicated additional testing with an alternate test  methodology [phone]) is advised. The SARS-CoV-2 RNA is generally  detectable in upper and lower  respiratory sp ecimens during the acute  phase of infection. The expected result is Negative. Fact Sheet for Patients:  StrictlyIdeas.no Fact Sheet for Healthcare Providers: BankingDealers.co.za This test is not yet approved or cleared by the [place] FDA and has been authorized for detection and/or diagnosis of SARS-CoV-2 by FDA under an Emergency Use Authorization (EUA).  This EUA will remain in effect (meaning this test can be used) for the duration of the COVID-19 declaration under Section 564(b)(1) of the Act, 21 U.S.C. section 360bbb-3(b)(1), unless the authorization is terminated or revoked sooner. Performed at [facility], [place] [address], [place], [place] [address]      Labs: BNP (last 3 results) No results for input(s): BNP in the last  8760 hours. Basic Metabolic Panel: Recent Labs  Lab 05/29/19 1654 05/29/19 2054 05/30/19 0535 05/30/19 0718 05/30/19 1301 05/31/19 0308  NA 127* 131* 135 135  --  135  K 3.6 3.6 4.0 4.2  --  3.7  CL 97* 101  --  106  --  104  CO2 16* 17*  --  19*  --  22  GLUCOSE 277* 216*  --  203*  --  163*  BUN 11 10  --  10  --  9  CREATININE 1.29* 1.34*  --  1.27* 1.42* 1.20  CALCIUM 8.7* 8.6*  --  9.2  --  8.7*  MG 1.8  --   --  1.8  --   --   PHOS 4.0  --   --  3.5  --   --    Liver Function Tests: Recent Labs  Lab 05/29/19 1654  AST 16  ALT 15  ALKPHOS 63  BILITOT 0.7  PROT 6.7  ALBUMIN 4.0   No results for input(s): LIPASE, AMYLASE in the last 168 hours. No results for input(s): AMMONIA in the last 168 hours. CBC: Recent Labs  Lab 05/30/19 0535 05/30/19 0718 05/30/19 1301 05/31/19 0308  WBC  --  5.8 4.4 3.9*  NEUTROABS  --   --   --  1.9  HGB 14.3 14.5 13.9 13.0  HCT 42.0 41.3 39.2 37.2*  MCV  --  87.5 86.7 86.7  PLT  --  137* 140* 125*   Cardiac Enzymes: No results for input(s): CKTOTAL, CKMB, CKMBINDEX, TROPONINI in the last 168 hours. BNP: Invalid input(s): POCBNP CBG: Recent Labs  Lab 06/03/19 0717 06/03/19 1105 06/03/19 1613 06/03/19 1924 06/04/19 0648  GLUCAP 177* 245* 209* 197* 221*   D-Dimer No results for input(s): DDIMER in the last 72 hours. Hgb A1c No results for input(s): HGBA1C in the last 72 hours. Lipid Profile No results for input(s): CHOL, HDL, LDLCALC, TRIG, CHOLHDL, LDLDIRECT in the last 72 hours. Thyroid function studies No results for input(s): TSH, T4TOTAL, T3FREE, THYROIDAB in the last 72 hours.  Invalid input(s): FREET3 Anemia work up No results for input(s): VITAMINB12, FOLATE, FERRITIN, TIBC, IRON, RETICCTPCT in the last 72 hours. Urinalysis    Component Value Date/Time   COLORURINE STRAW (A) 05/29/2019 2046   APPEARANCEUR CLEAR 05/29/2019 2046   LABSPEC 1.003 (L) 05/29/2019 2046   PHURINE 6.0 05/29/2019 2046    GLUCOSEU NEGATIVE 05/29/2019 2046   HGBUR NEGATIVE 05/29/2019 2046   BILIRUBINUR NEGATIVE 05/29/2019 2046   BILIRUBINUR neg 01/02/2016 [place] 05/29/2019 2046   PROTEINUR NEGATIVE 05/29/2019 2046   UROBILINOGEN 0.2 01/02/2016 1526   UROBILINOGEN 0.2 07/27/2015 0322   NITRITE NEGATIVE 05/29/2019 2046   LEUKOCYTESUR NEGATIVE 05/29/2019  2046   Sepsis Labs Invalid input(s): PROCALCITONIN,  WBC,  LACTICIDVEN Microbiology Recent Results (from the past 240 hour(s))  SARS CORONAVIRUS 2 Nasal Swab Aptima Multi Swab     Status: None   Collection Time: 05/29/19  8:42 PM   Specimen: Aptima Multi Swab; Nasal Swab  Result Value Ref Range Status   SARS Coronavirus 2 NEGATIVE NEGATIVE Final    Comment: (NOTE) SARS-CoV-2 target nucleic acids are NOT DETECTED. The SARS-CoV-2 RNA is generally detectable in upper and lower respiratory specimens during the acute phase of infection. Negative results do not preclude SARS-CoV-2 infection, do not rule out co-infections with other pathogens, and should not be used as the sole basis for treatment or other patient management decisions. Negative results must be combined with clinical observations, patient history, and epidemiological information. The expected result is Negative. Fact Sheet for Patients: SugarRoll.be Fact Sheet for Healthcare Providers: https://www.woods-mathews.com/ This test is not yet approved or cleared by the [place] FDA and  has been authorized for detection and/or diagnosis of SARS-CoV-2 by FDA under an Emergency Use Authorization (EUA). This EUA will remain  in effect (meaning this test can be used) for the duration of the COVID-19 declaration under Section 56 4(b)(1) of the Act, 21 U.S.C. section 360bbb-3(b)(1), unless the authorization is terminated or revoked sooner. Performed at [facility], [place] [address], [place], [place] [address]   MRSA PCR  Screening     Status: None   Collection Time: 05/30/19  3:58 AM   Specimen: Nasal Mucosa; Nasopharyngeal  Result Value Ref Range Status   MRSA by PCR NEGATIVE NEGATIVE Final    Comment:        The GeneXpert MRSA Assay (FDA approved for NASAL specimens only), is one component of a comprehensive MRSA colonization surveillance program. It is not intended to diagnose MRSA infection nor to guide or monitor treatment for MRSA infections. Performed at [facility], [place] [address] [place], [place] [address]   SARS Coronavirus 2 [facility] order, Performed in [facility] hospital lab) Nasopharyngeal Nasopharyngeal Swab     Status: None   Collection Time: 05/30/19  4:12 AM   Specimen: Nasopharyngeal Swab  Result Value Ref Range Status   SARS Coronavirus 2 NEGATIVE NEGATIVE Final    Comment: (NOTE) If result is NEGATIVE SARS-CoV-2 target nucleic acids are NOT DETECTED. The SARS-CoV-2 RNA is generally detectable in upper and lower  respiratory specimens during the acute phase of infection. The lowest  concentration of SARS-CoV-2 viral copies this assay can detect is 250  copies / mL. A negative result does not preclude SARS-CoV-2 infection  and should not be used as the sole basis for treatment or other  patient management decisions.  A negative result may occur with  improper specimen collection / handling, submission of specimen other  than nasopharyngeal swab, presence of viral mutation(s) within the  areas targeted by this assay, and inadequate number of viral copies  (<250 copies / mL). A negative result must be combined with clinical  observations, patient history, and epidemiological information. If result is POSITIVE SARS-CoV-2 target nucleic acids are DETECTED. The SARS-CoV-2 RNA is generally detectable in upper and lower  respiratory specimens dur ing the acute phase of infection.  Positive  results are indicative of active infection with SARS-CoV-2.  Clinical   correlation with patient history and other diagnostic information is  necessary to determine patient infection status.  Positive results do  not rule out bacterial infection or co-infection  with other viruses. If result is PRESUMPTIVE POSTIVE SARS-CoV-2 nucleic acids MAY BE PRESENT.   A presumptive positive result was obtained on the submitted specimen  and confirmed on repeat testing.  While 2019 novel coronavirus  (SARS-CoV-2) nucleic acids may be present in the submitted sample  additional confirmatory testing may be necessary for epidemiological  and / or clinical management purposes  to differentiate between  SARS-CoV-2 and other Sarbecovirus currently known to infect humans.  If clinically indicated additional testing with an alternate test  methodology [phone]) is advised. The SARS-CoV-2 RNA is generally  detectable in upper and lower respiratory sp ecimens during the acute  phase of infection. The expected result is Negative. Fact Sheet for Patients:  StrictlyIdeas.no Fact Sheet for Healthcare Providers: BankingDealers.co.za This test is not yet approved or cleared by the [place] FDA and has been authorized for detection and/or diagnosis of SARS-CoV-2 by FDA under an Emergency Use Authorization (EUA).  This EUA will remain in effect (meaning this test can be used) for the duration of the COVID-19 declaration under Section 564(b)(1) of the Act, 21 U.S.C. section 360bbb-3(b)(1), unless the authorization is terminated or revoked sooner. Performed at [facility], [place] [address], [place], [place] [address]      Time coordinating discharge: 35 minutes  SIGNED:   Aline August, MD  [facility] 06/04/2019, 7:33 AM

## 2019-06-04 NOTE — Care Management Important Message (Signed)
Important Message  Patient Details  Name: [name] MRN: [id number] Date of Birth: June 20, 1961   Medicare Important Message Given:  Yes     Rania Prothero 06/04/2019, 2:46 PM

## 2019-06-05 LAB — GLUCOSE, CAPILLARY
Glucose-Capillary: 269 mg/dL — ABNORMAL HIGH (ref 70–99)
Glucose-Capillary: 255 mg/dL — ABNORMAL HIGH (ref 70–99)
Glucose-Capillary: 272 mg/dL — ABNORMAL HIGH (ref 70–99)
Glucose-Capillary: 254 mg/dL — ABNORMAL HIGH (ref 70–99)

## 2019-06-05 NOTE — Progress Notes (Signed)
Patient ID: [name], male   DOB: 07-10-1961, 58 y.o.   MRN: [id number] Patient is waiting for discharge to [facility].  He is medically stable for discharge.  Patient seen and examined at bedside.  Please refer to the discharge summary done by me on 06/02/2019 and again on 06/04/2019 for full details.  Continue one-to-one observation.

## 2019-06-05 NOTE — Progress Notes (Signed)
Patient refused bedtime insulin at this time. Education given to patient but he still declined. [place] notified. Will continue to monitor.   Celestia Khat

## 2019-06-06 DIAGNOSIS — E1169 Type 2 diabetes mellitus with other specified complication: Secondary | ICD-10-CM

## 2019-06-06 LAB — CREATININE, SERUM
Creatinine, Ser: 1.17 mg/dL (ref 0.61–1.24)
GFR calc Af Amer: >60 mL/min (ref >60)
GFR calc non Af Amer: >60 mL/min (ref >60)

## 2019-06-06 LAB — GLUCOSE, CAPILLARY
Glucose-Capillary: 200 mg/dL — ABNORMAL HIGH (ref 70–99)
Glucose-Capillary: 183 mg/dL — ABNORMAL HIGH (ref 70–99)
Glucose-Capillary: 211 mg/dL — ABNORMAL HIGH (ref 70–99)
Glucose-Capillary: 177 mg/dL — ABNORMAL HIGH (ref 70–99)

## 2019-06-06 MED ORDER — NICOTINE 21 MG/24HR TD PT24
21.0000 mg | MEDICATED_PATCH | Freq: Every day | TRANSDERMAL | Status: DC
  Administered 2019-06-06: 21 mg via TRANSDERMAL
  Filled 2019-06-06: qty 1

## 2019-06-06 NOTE — Plan of Care (Signed)
  Problem: Education: Goal: Knowledge of General Education information will improve Description Including pain rating scale, medication(s)/side effects and non-pharmacologic comfort measures Outcome: Progressing   

## 2019-06-06 NOTE — Progress Notes (Signed)
Pt refused Vital signs. Upset because he states that the NT did not let him sleep last night but is using belligerent language. Pt was given ordered PO ativan. MD Mikhail aware of refusal of VS.   Paulla Fore, RN

## 2019-06-06 NOTE — Progress Notes (Signed)
PROGRESS NOTE    [name]  XTG:[id number] DOB: [birth year] DOA: 05/29/2019 PCP: Celene Squibb, MD   Brief Narrative:  HPI on 05/30/2019 by Dr. Montey Hora [name] is a 58 y.o. male who has a PMH including but not limited to Substance abuse, shizoaffective disorder, MI, HTN, cardiac arrest, COPD, DM (see "past medical history" for rest).  He presented to [facility] ED 8/11 after drinking roughly 10oz of antifreeze as part of a suicide attempt.  He called EMS because his stomach hurt and states that he has done the same in the past and forgot how much his stomach hurt afterwards.  In ED, he received fomepizole (loading dose - 15 mg/kg), thiamine, pyridoxine.  He was then transferred to [facility] for further evaluation and management.    UDS negative. Ethylene glycol pending.  Osmoles 287, osmolar gap 9, SCr 1.34, ABG 7.35 / 35 / 45 / 5.6.  Interim history Patient admitted to the ICU with suicidal attempt secondary to ethylene glycol ingestion.  He was treated with omeprazole, IV fluids, thiamine, folate and pyridoxine.  Psychiatry was consulted recommending inpatient psychiatric hospitalization.  Currently awaiting [facility] bed. Assessment & Plan   Suicidal attempt with ethylene glycol ingestion -Treated with fomepizole/IV fluids/thiamine/folate/pyridoxine -Currently medically and hemodynamically stable and transferred out of ICU. -Continue suicide precautions and one-to-one observation. -Psychiatry recommended inpatient psychiatric hospitalization and he was started on Saphris as per psychiatry recommendations. - He has been hemodynamically stable and medically stable for transfer to [facility] once bed is available.  Hypertension -Continue amlodipine, metoprolol  Diabetes mellitus type 2 -Continue Levemir, insulin sliding scale, CBG monitoring  Tobacco abuse -Patient was counseled regarding tobacco cessation during the hospitalization by PCCM team -Continue nicotine patch  DVT Prophylaxis   lovenox  Code Status: Full  Family Communication: None at bedside  Disposition Plan: Admitted. Pending inpatient psych bed  Consultants PCCM  Procedures  None  Antibiotics   Anti-infectives (From admission, onward)   None      Subjective:   [name] seen and examined today.  Not very conversant this morning.  States he does not know why he is here where he is going or what the plan is.  Denies current chest pain or shortness of breath, abdominal pain, dizziness or headache.  Objective:   Vitals:   06/05/19 0510 06/05/19 1713 06/05/19 2212 06/06/19 0613  BP: 117/62 112/60 133/62 121/70  Pulse: 66 64 67 65  Resp: 20 18 19 18   Temp: 98.3 F (36.8 C) 97.9 F (36.6 C) 98.3 F (36.8 C) 97.9 F (36.6 C)  TempSrc: Oral Oral  Oral  SpO2: 94% 96% 95% 99%  Weight:   103.3 kg   Height:        Intake/Output Summary (Last 24 hours) at 06/06/2019 1031 Last data filed at 06/06/2019 0600 Gross per 24 hour  Intake 1800 ml  Output 0 ml  Net 1800 ml   Filed Weights   05/30/19 0355 06/02/19 2006 06/05/19 2212  Weight: 88.4 kg 103 kg 103.3 kg    Exam  General: Well developed, well nourished, NAD, appears stated age  61: NCAT,  mucous membranes moist.   Cardiovascular: S1 S2 auscultated, RRR  Respiratory: Diminished breath sounds, however clear  Abdomen: Soft, nontender, nondistended, + bowel sounds  Extremities: warm dry without cyanosis clubbing or edema  Neuro: awake and alert, not very interactive  Psych: Flat  Data Reviewed: I have personally reviewed following labs and imaging studies  CBC: Recent  Labs  Lab 05/30/19 1301 05/31/19 0308  WBC 4.4 3.9*  NEUTROABS  --  1.9  HGB 13.9 13.0  HCT 39.2 37.2*  MCV 86.7 86.7  PLT 140* 809*   Basic Metabolic Panel: Recent Labs  Lab 05/30/19 1301 05/31/19 0308 06/06/19 0638  NA  --  135  --   K  --  3.7  --   CL  --  104  --   CO2  --  22  --   GLUCOSE  --  163*  --   BUN  --  9  --   CREATININE  1.42* 1.20 1.17  CALCIUM  --  8.7*  --    GFR: Estimated Creatinine Clearance: 85.6 mL/min (by C-G formula based on SCr of 1.17 mg/dL). Liver Function Tests: No results for input(s): AST, ALT, ALKPHOS, BILITOT, PROT, ALBUMIN in the last 168 hours. No results for input(s): LIPASE, AMYLASE in the last 168 hours. No results for input(s): AMMONIA in the last 168 hours. Coagulation Profile: No results for input(s): INR, PROTIME in the last 168 hours. Cardiac Enzymes: No results for input(s): CKTOTAL, CKMB, CKMBINDEX, TROPONINI in the last 168 hours. BNP (last 3 results) No results for input(s): PROBNP in the last 8760 hours. HbA1C: No results for input(s): HGBA1C in the last 72 hours. CBG: Recent Labs  Lab 06/05/19 0728 06/05/19 1133 06/05/19 1634 06/05/19 2213 06/06/19 0712  GLUCAP 269* 255* 272* 254* 200*   Lipid Profile: No results for input(s): CHOL, HDL, LDLCALC, TRIG, CHOLHDL, LDLDIRECT in the last 72 hours. Thyroid Function Tests: No results for input(s): TSH, T4TOTAL, FREET4, T3FREE, THYROIDAB in the last 72 hours. Anemia Panel: No results for input(s): VITAMINB12, FOLATE, FERRITIN, TIBC, IRON, RETICCTPCT in the last 72 hours. Urine analysis:    Component Value Date/Time   COLORURINE STRAW (A) 05/29/2019 2046   APPEARANCEUR CLEAR 05/29/2019 2046   LABSPEC 1.003 (L) 05/29/2019 2046   PHURINE 6.0 05/29/2019 2046   GLUCOSEU NEGATIVE 05/29/2019 2046   HGBUR NEGATIVE 05/29/2019 2046   BILIRUBINUR NEGATIVE 05/29/2019 2046   BILIRUBINUR neg 01/02/2016 1526   KETONESUR NEGATIVE 05/29/2019 2046   PROTEINUR NEGATIVE 05/29/2019 2046   UROBILINOGEN 0.2 01/02/2016 1526   UROBILINOGEN 0.2 07/27/2015 0322   NITRITE NEGATIVE 05/29/2019 2046   LEUKOCYTESUR NEGATIVE 05/29/2019 2046   Sepsis Labs: @LABRCNTIP (procalcitonin:4,lacticidven:4)  ) Recent Results (from the past 240 hour(s))  SARS CORONAVIRUS 2 Nasal Swab Aptima Multi Swab     Status: None   Collection Time: 05/29/19   8:42 PM   Specimen: Aptima Multi Swab; Nasal Swab  Result Value Ref Range Status   SARS Coronavirus 2 NEGATIVE NEGATIVE Final    Comment: (NOTE) SARS-CoV-2 target nucleic acids are NOT DETECTED. The SARS-CoV-2 RNA is generally detectable in upper and lower respiratory specimens during the acute phase of infection. Negative results do not preclude SARS-CoV-2 infection, do not rule out co-infections with other pathogens, and should not be used as the sole basis for treatment or other patient management decisions. Negative results must be combined with clinical observations, patient history, and epidemiological information. The expected result is Negative. Fact Sheet for Patients: SugarRoll.be Fact Sheet for Healthcare Providers: https://www.woods-mathews.com/ This test is not yet approved or cleared by the [place] FDA and  has been authorized for detection and/or diagnosis of SARS-CoV-2 by FDA under an Emergency Use Authorization (EUA). This EUA will remain  in effect (meaning this test can be used) for the duration of the COVID-19 declaration under Section 56  4(b)(1) of the Act, 21 U.S.C. section 360bbb-3(b)(1), unless the authorization is terminated or revoked sooner. Performed at [facility], [place] [address], [place], [place] [address]   MRSA PCR Screening     Status: None   Collection Time: 05/30/19  3:58 AM   Specimen: Nasal Mucosa; Nasopharyngeal  Result Value Ref Range Status   MRSA by PCR NEGATIVE NEGATIVE Final    Comment:        The GeneXpert MRSA Assay (FDA approved for NASAL specimens only), is one component of a comprehensive MRSA colonization surveillance program. It is not intended to diagnose MRSA infection nor to guide or monitor treatment for MRSA infections. Performed at [facility], [place] [address], [place], [place] [address]   SARS Coronavirus 2 [facility] order, Performed in [facility]  hospital lab) Nasopharyngeal Nasopharyngeal Swab     Status: None   Collection Time: 05/30/19  4:12 AM   Specimen: Nasopharyngeal Swab  Result Value Ref Range Status   SARS Coronavirus 2 NEGATIVE NEGATIVE Final    Comment: (NOTE) If result is NEGATIVE SARS-CoV-2 target nucleic acids are NOT DETECTED. The SARS-CoV-2 RNA is generally detectable in upper and lower  respiratory specimens during the acute phase of infection. The lowest  concentration of SARS-CoV-2 viral copies this assay can detect is 250  copies / mL. A negative result does not preclude SARS-CoV-2 infection  and should not be used as the sole basis for treatment or other  patient management decisions.  A negative result may occur with  improper specimen collection / handling, submission of specimen other  than nasopharyngeal swab, presence of viral mutation(s) within the  areas targeted by this assay, and inadequate number of viral copies  (<250 copies / mL). A negative result must be combined with clinical  observations, patient history, and epidemiological information. If result is POSITIVE SARS-CoV-2 target nucleic acids are DETECTED. The SARS-CoV-2 RNA is generally detectable in upper and lower  respiratory specimens dur ing the acute phase of infection.  Positive  results are indicative of active infection with SARS-CoV-2.  Clinical  correlation with patient history and other diagnostic information is  necessary to determine patient infection status.  Positive results do  not rule out bacterial infection or co-infection with other viruses. If result is PRESUMPTIVE POSTIVE SARS-CoV-2 nucleic acids MAY BE PRESENT.   A presumptive positive result was obtained on the submitted specimen  and confirmed on repeat testing.  While 2019 novel coronavirus  (SARS-CoV-2) nucleic acids may be present in the submitted sample  additional confirmatory testing may be necessary for epidemiological  and / or clinical management  purposes  to differentiate between  SARS-CoV-2 and other Sarbecovirus currently known to infect humans.  If clinically indicated additional testing with an alternate test  methodology [phone]) is advised. The SARS-CoV-2 RNA is generally  detectable in upper and lower respiratory sp ecimens during the acute  phase of infection. The expected result is Negative. Fact Sheet for Patients:  StrictlyIdeas.no Fact Sheet for Healthcare Providers: BankingDealers.co.za This test is not yet approved or cleared by the [place] FDA and has been authorized for detection and/or diagnosis of SARS-CoV-2 by FDA under an Emergency Use Authorization (EUA).  This EUA will remain in effect (meaning this test can be used) for the duration of the COVID-19 declaration under Section 564(b)(1) of the Act, 21 U.S.C. section 360bbb-3(b)(1), unless the authorization is terminated or revoked sooner. Performed at [facility], [place] [address]  [place], [place] [address]       Radiology Studies: No results found.   Scheduled Meds: . amLODipine  10 mg Oral Daily  . asenapine  5 mg Sublingual BID  . aspirin  81 mg Oral Daily  . atorvastatin  40 mg Oral q1800  . baclofen  10 mg Oral TID  . enoxaparin (LOVENOX) injection  40 mg Subcutaneous Q24H  . folic acid  1 mg Oral Daily  . gabapentin  800 mg Oral TID  . hydrOXYzine  10 mg Oral TID  . insulin aspart  0-15 Units Subcutaneous TID WC & HS  . insulin detemir  20 Units Subcutaneous Daily  . isosorbide mononitrate  30 mg Oral Daily  . LORazepam  1 mg Oral BID  . metoprolol succinate  50 mg Oral Daily  . mometasone-formoterol  2 puff Inhalation BID  . nicotine  21 mg Transdermal Daily  . pyridOXINE  100 mg Intravenous Daily  . senna-docusate  1 tablet Oral BID  . sodium bicarbonate  50 mEq Intravenous Once  . thiamine  100 mg Oral Daily   Continuous Infusions: . sodium chloride       LOS: 7 days    Time Spent in minutes   30 minutes  [name] D.O. on 06/06/2019 at 10:31 AM  Between 7am to 7pm - Please see pager noted on [email or web address]  After 7pm go to [email or web address]  And look for the night coverage person covering for me after hours  [facility] Office  [phone]

## 2019-06-07 DIAGNOSIS — T6594XA Toxic effect of unspecified substance, undetermined, initial encounter: Secondary | ICD-10-CM

## 2019-06-07 DIAGNOSIS — F209 Schizophrenia, unspecified: Secondary | ICD-10-CM

## 2019-06-07 DIAGNOSIS — F419 Anxiety disorder, unspecified: Secondary | ICD-10-CM

## 2019-06-07 DIAGNOSIS — T6591XA Toxic effect of unspecified substance, accidental (unintentional), initial encounter: Secondary | ICD-10-CM

## 2019-06-07 LAB — GLUCOSE, CAPILLARY: Glucose-Capillary: 285 mg/dL — ABNORMAL HIGH (ref 70–99)

## 2019-06-07 MED ORDER — NICOTINE 21 MG/24HR TD PT24: 21.0000 mg | MEDICATED_PATCH | Freq: Every day | TRANSDERMAL | 0 refills | Status: DC

## 2019-06-07 NOTE — Clinical Social Work Note (Signed)
Patient medically stable for discharge home today and was provided with a cab voucher to get home - [place] in [place].  Rinoa Garramone Givens, MSW, LCSW Licensed Clinical Social Worker [place] [phone]

## 2019-06-07 NOTE — Consult Note (Addendum)
Telepsych Consultation   Reason for Consult:  "Patient requests to be seen by psychiatry again- hopefully to go home." Referring Physician:  Dr. Cristal Ford Location of Patient: [facility] Location of Provider: [facility]  Patient Identification: [name] MRN:  [id number] Principal Diagnosis: Poisoning, antifreeze Diagnosis:  Principal Problem:   Suicide attempt [facility]) Active Problems:   Overdose   Total Time spent with patient: 15 minutes  Subjective:   [name] is a 58 y.o. male patient admitted with suicide attempt by ingesting antifreeze.  HPI:   Per chart review, patient was admitted with suicide attempt by ingesting antifreeze. He ingested about 10 ounces of antifreeze. He called EMS due to GI upset. He received Fomepizole, thiamine and pyridoxine. He was given fluids for AKI. He was transferred from [facility] for further management. UDS and BAL were negative on admission.   Of note, patient was last seen by TTS at [facility] ED on 8/9 for SI with plan to overdose. He reported being off his medication for a month. He was initially recommended for inpatient psychiatric hospitalization but later denied SI and was psychiatrically cleared. He was last admitted to a psychiatric facility a month ago. Home medications include Gabapentin 800 mg QID, Atarax 10 mg TID and monthly Invega Sustenna 234 mg. He is scheduled Ativan 1 mg BID (started on 8/13). He was last seen by the psychiatry consult service on 8/12 and recommended for inpatient psychiatric hospitalization. He was started on Saphris for psychosis. He has been refusing his medications per MAR.   On interview, [name] reports taking antifreeze to "get drunk." He reports that he will never ingest it again. He reports that his mood is good. He denies SI, HI or AVH. He denies problems with sleep or appetite. He plans to follow up with his ACT team. He reports that he lives across the street from his  treatment team. He reports that he feels safe for discharge and if he has recurrent SI that he will call the suicide hotline.    Past Psychiatric History: Schizoaffective disorder, anxiety and substance abuse (crack cocaine)  Risk to Self:   Low. Denies SI and is future oriented.   Risk to Others:  None. Denies HI.  Prior Inpatient Therapy:  He was last hospitalized a month ago at [facility].  Prior Outpatient Therapy:  [facility]  Past Medical History:  Past Medical History:  Diagnosis Date  . Alcohol dependence ([place]) 1979   stated abusing ETOH at age 69   . Back pain   . Benzodiazepine dependence ([place])   . Cardiac arrest ([place])   . Chronic back pain   . Cocaine abuse ([place])   . COPD (chronic obstructive pulmonary disease) ([place])   . Diabetes mellitus without complication ([place]) 4401  . Hypertension 2008  . MI (myocardial infarction) ([place])   . Non-compliance   . Schizoaffective disorder ([place]) 2006   . Substance abuse [facility])     Past Surgical History:  Procedure Laterality Date  . CYST EXCISION     Family History:  Family History  Problem Relation Age of Onset  . Heart disease Mother   . Heart disease Father   . Heart disease Maternal Grandmother   . Diabetes Maternal Grandmother    Family Psychiatric  History: Denies  Social History:  Social History   Substance and Sexual Activity  Alcohol Use Yes  . Alcohol/week: 0.0 standard drinks   Comment: last drank week ago  Social History   Substance and Sexual Activity  Drug Use Not Currently  . Types: Cocaine   Comment: Denies any use in past 6 months    Social History   Socioeconomic History  . Marital status: Single    Spouse name: Not on file  . Number of children: Not on file  . Years of education: Not on file  . Highest education level: Not on file  Occupational History  . Not on file  Social Needs  . Financial resource strain: Not on file  . Food insecurity    Worry: Not on file    Inability: Not on  file  . Transportation needs    Medical: Not on file    Non-medical: Not on file  Tobacco Use  . Smoking status: Current Every Day Smoker    Packs/day: 1.50    Types: Cigarettes  . Smokeless tobacco: Never Used  Substance and Sexual Activity  . Alcohol use: Yes    Alcohol/week: 0.0 standard drinks    Comment: last drank week ago  . Drug use: Not Currently    Types: Cocaine    Comment: Denies any use in past 6 months  . Sexual activity: Never  Lifestyle  . Physical activity    Days per week: Not on file    Minutes per session: Not on file  . Stress: Not on file  Relationships  . Social Herbalist on phone: Not on file    Gets together: Not on file    Attends religious service: Not on file    Active member of club or organization: Not on file    Attends meetings of clubs or organizations: Not on file    Relationship status: Not on file  Other Topics Concern  . Not on file  Social History Narrative  . Not on file   Additional Social History: He lives alone. He is divorced. He does not have children. He denies alcohol or illicit substance use.      Allergies:   Allergies  Allergen Reactions  . Quetiapine Anaphylaxis  . Seroquel [Quetiapine Fumarate] Anxiety and Other (See Comments)    Reaction:  Nightmares   . Trazodone Anaphylaxis  . Trazodone And Nefazodone Anxiety and Other (See Comments)    Other reaction(s): Unknown Nightmares Reaction:  Nightmares   . Vistaril [Hydroxyzine Hcl] Nausea And Vomiting  . Haldol [Haloperidol Lactate] Anxiety and Other (See Comments)    Reaction:  Nightmares     Labs:  Results for orders placed or performed during the hospital encounter of 05/29/19 (from the past 48 hour(s))  Glucose, capillary     Status: Abnormal   Collection Time: 06/05/19  4:34 PM  Result Value Ref Range   Glucose-Capillary 272 (H) 70 - 99 mg/dL  Glucose, capillary     Status: Abnormal   Collection Time: 06/05/19 10:13 PM  Result Value Ref Range    Glucose-Capillary 254 (H) 70 - 99 mg/dL  Creatinine, serum     Status: None   Collection Time: 06/06/19  6:38 AM  Result Value Ref Range   Creatinine, Ser 1.17 0.61 - 1.24 mg/dL   GFR calc non Af Amer >60 >60 mL/min   GFR calc Af Amer >60 >60 mL/min    Comment: Performed at [facility] [address], [place], [place] [address]  Glucose, capillary     Status: Abnormal   Collection Time: 06/06/19  7:12 AM  Result Value Ref Range  Glucose-Capillary 200 (H) 70 - 99 mg/dL  Glucose, capillary     Status: Abnormal   Collection Time: 06/06/19 11:33 AM  Result Value Ref Range   Glucose-Capillary 183 (H) 70 - 99 mg/dL  Glucose, capillary     Status: Abnormal   Collection Time: 06/06/19  4:35 PM  Result Value Ref Range   Glucose-Capillary 211 (H) 70 - 99 mg/dL  Glucose, capillary     Status: Abnormal   Collection Time: 06/06/19  8:45 PM  Result Value Ref Range   Glucose-Capillary 177 (H) 70 - 99 mg/dL  Glucose, capillary     Status: Abnormal   Collection Time: 06/07/19  6:39 AM  Result Value Ref Range   Glucose-Capillary 285 (H) 70 - 99 mg/dL    Medications:  Current Facility-Administered Medications  Medication Dose Route Frequency Provider Last Rate Last Dose  . albuterol (PROVENTIL) (2.5 MG/3ML) 0.083% nebulizer solution 2.5 mg  2.5 mg Nebulization Q3H PRN Desai, Rahul P, PA-C      . amLODipine (NORVASC) tablet 10 mg  10 mg Oral Daily Olalere, Adewale A, MD   10 mg at 06/05/19 1046  . asenapine (SAPHRIS) sublingual tablet 5 mg  5 mg Sublingual BID Olalere, Adewale A, MD   5 mg at 06/04/19 2159  . aspirin chewable tablet 81 mg  81 mg Oral Daily Desai, Rahul P, PA-C   81 mg at 06/07/19 0919  . atorvastatin (LIPITOR) tablet 40 mg  40 mg Oral q1800 Desai, Rahul P, PA-C   40 mg at 06/06/19 1737  . baclofen (LIORESAL) tablet 10 mg  10 mg Oral TID Sherrilyn Rist A, MD   10 mg at 06/06/19 2213  . enoxaparin (LOVENOX) injection 40 mg  40 mg Subcutaneous Q24H Olalere, Adewale A,  MD   40 mg at 06/07/19 0919  . folic acid (FOLVITE) tablet 1 mg  1 mg Oral Daily Olalere, Adewale A, MD   1 mg at 06/07/19 0920  . gabapentin (NEURONTIN) capsule 800 mg  800 mg Oral TID Brand Males, MD   800 mg at 06/07/19 0920  . hydrALAZINE (APRESOLINE) injection 20 mg  20 mg Intravenous Q4H PRN Olalere, Adewale A, MD   20 mg at 05/31/19 1733  . hydrOXYzine (ATARAX/VISTARIL) tablet 10 mg  10 mg Oral TID Shearon Stalls, Rahul P, PA-C   10 mg at 06/06/19 1737  . insulin aspart (novoLOG) injection 0-15 Units  0-15 Units Subcutaneous TID WC & HS Omar Person, NP   8 Units at 06/07/19 0759  . insulin detemir (LEVEMIR) injection 20 Units  20 Units Subcutaneous Daily Olalere, Adewale A, MD   20 Units at 06/06/19 0920  . isosorbide mononitrate (IMDUR) 24 hr tablet 30 mg  30 mg Oral Daily Desai, Rahul P, PA-C   30 mg at 06/07/19 7001  . LORazepam (ATIVAN) injection 1 mg  1 mg Intravenous Q4H PRN Olalere, Adewale A, MD   1 mg at 06/06/19 1746  . LORazepam (ATIVAN) tablet 1 mg  1 mg Oral BID Olalere, Adewale A, MD   1 mg at 06/07/19 0921  . metoprolol succinate (TOPROL-XL) 24 hr tablet 50 mg  50 mg Oral Daily Desai, Rahul P, PA-C   50 mg at 06/07/19 7494  . mometasone-formoterol (DULERA) 200-5 MCG/ACT inhaler 2 puff  2 puff Inhalation BID Shearon Stalls, Rahul P, PA-C   2 puff at 06/03/19 0844  . nicotine (NICODERM CQ - dosed in mg/24 hours) patch 21 mg  21 mg Transdermal QHS Kirby-Graham,  Karsten Fells, NP   21 mg at 06/06/19 2217  . ondansetron (ZOFRAN) tablet 8 mg  8 mg Oral Q8H PRN Omar Person, NP   8 mg at 06/03/19 2245  . oxyCODONE-acetaminophen (PERCOCET) 7.5-325 MG per tablet 2 tablet  2 tablet Oral Q6H PRN Olalere, Adewale A, MD   2 tablet at 06/07/19 1145  . polyethylene glycol (MIRALAX / GLYCOLAX) packet 17 g  17 g Oral Daily PRN Starla Link, Kshitiz, MD      . pyridOXINE (B-6) injection 100 mg  100 mg Intravenous Daily Suella Broad A, PA-C   100 mg at 06/07/19 1937  . senna-docusate (Senokot-S) tablet 1  tablet  1 tablet Oral BID Aline August, MD   1 tablet at 06/07/19 0921  . sodium bicarbonate injection 50 mEq  50 mEq Intravenous Once Desai, Rahul P, PA-C      . sodium chloride 0.9 % bolus 1,000 mL  1,000 mL Intravenous Once Suella Broad A, PA-C      . thiamine (VITAMIN B-1) tablet 100 mg  100 mg Oral Daily Olalere, Adewale A, MD   100 mg at 06/07/19 0921    Musculoskeletal: Strength & Muscle Tone: No atrophy noted. Gait & Station: UTA since patient is lying in bed. Patient leans: N/A  Psychiatric Specialty Exam: Physical Exam  Nursing note and vitals reviewed. Constitutional: He is oriented to person, place, and time. He appears well-developed and well-nourished.  HENT:  Head: Normocephalic and atraumatic.  Neck: Normal range of motion.  Respiratory: Effort normal.  Musculoskeletal: Normal range of motion.  Neurological: He is alert and oriented to person, place, and time.  Psychiatric: He has a normal mood and affect. His speech is normal and behavior is normal. Judgment and thought content normal. Cognition and memory are normal.    Review of Systems  Gastrointestinal: Positive for constipation. Negative for diarrhea, nausea and vomiting.  Psychiatric/Behavioral: Negative for depression, hallucinations and suicidal ideas.  All other systems reviewed and are negative.   Blood pressure (!) 149/100, pulse 71, temperature 98.1 F (36.7 C), temperature source Oral, resp. rate 19, height 6' (1.829 m), weight 100.4 kg, SpO2 98 %.Body mass index is 30.01 kg/m.  General Appearance: Fairly Groomed, middle aged, Caucasian male, wearing a hospital gown with a full beard who is lying in bed. NAD.   Eye Contact:  Good  Speech:  Clear and Coherent and Normal Rate  Volume:  Normal  Mood:  Euthymic  Affect:  Appropriate and Congruent  Thought Process:  Goal Directed, Linear and Descriptions of Associations: Intact  Orientation:  Full (Time, Place, and Person)  Thought Content:  Logical   Suicidal Thoughts:  No  Homicidal Thoughts:  No  Memory:  Immediate;   Fair Recent;   Fair Remote;   Fair  Judgement:  Fair  Insight:  Fair  Psychomotor Activity:  Normal  Concentration:  Concentration: Fair and Attention Span: Fair  Recall:  Good  Fund of Knowledge:  Fair  Language:  Good  Akathisia:  No  Handed:  Right  AIMS (if indicated):   N/A  Assets:  Desire for Improvement Housing Resilience  ADL's:  Intact  Cognition:  WNL  Sleep:   N/A   Assessment:  [name] is a 58 y.o. male who was admitted with intentional antifreeze ingestion for inebriation. Patient is euthymic in affect and future oriented. He continues to adamantly deny a suicide attempt and reports ingesting antifreeze for inebriation. He has a history of substance  abuse. He is organized in thought process. He denies SI, HI or AVH. He does not appear to be responding to internal stimuli. He no longer warrants inpatient psychiatric hospitalization and should follow up with his ACT team.    Treatment Plan Summary: -Continue psychotropic medications as prescribed. -Patient should follow up with his ACT team for further medication management.  -EKG reviewed and QTc 445 on 8/13. Please closely monitor when starting or increasing QTc prolonging agents.  -Will sign off on patient at this time. Please consult psychiatry again as needed.     Disposition: No evidence of imminent risk to self or others at present.   Patient does not meet criteria for psychiatric inpatient admission.  This service was provided via telemedicine using a 2-way, interactive audio and video technology.  Names of all persons participating in this telemedicine service and their role in this encounter. Name: Buford Dresser, DO Role: Psychiatrist   Name: Judithann Sheen Role: Patient    Faythe Dingwall, DO 06/07/2019 1:30 PM

## 2019-06-07 NOTE — Discharge Summary (Signed)
Physician Discharge Summary  [name] TMH:[id number] DOB: 1961/08/10 DOA: 05/29/2019  PCP: Celene Squibb, MD  Admit date: 05/29/2019 Discharge date: 06/07/2019  Time spent: 45 minutes  Recommendations for Outpatient Follow-up:  Patient will be discharged to home.  Patient will need to follow up with primary care provider within one week of discharge.  Patient should continue medications as prescribed.  Patient should follow a heart healthy/carb modified diet.   Discharge Diagnoses:  Suicidal attempt with ethylene glycol ingestion Hypertension Diabetes mellitus type 2 Tobacco abuse  Discharge Condition: Stable  Diet recommendation: heart healthy/carb modified  Filed Weights   06/02/19 2006 06/05/19 2212 06/06/19 2048  Weight: 103 kg 103.3 kg 100.4 kg    History of present illness:  on 05/30/2019 by Dr. Montey Hora [name] a 58 y.o.malewho has a PMH including but not limited to Substance abuse, shizoaffective disorder, MI, HTN, cardiac arrest, COPD, DM(see "past medical history" for rest). [facility]resented to [facility] 8/11 after drinking roughly 10oz of antifreeze as part of a suicide attempt. He called EMS because his stomach hurt and states that he has done the same in the past and forgot how much his stomach hurt afterwards.  In ED, he received fomepizole (loading dose - 15 mg/kg), thiamine, pyridoxine. He was then transferred to [facility] for further evaluation and management.   UDS negative. Ethylene glycol pending. Osmoles 287, osmolar gap 9, SCr 1.34, ABG 7.35 / 35 / 45 / 5.6.  Hospital Course:  Suicidal attempt with ethylene glycol ingestion -Treated with fomepizole/IV fluids/thiamine/folate/pyridoxine -Currently medically and hemodynamically stable and transferred out of ICU. -Continue suicide precautions and one-to-one observation. -Psychiatry initially recommended inpatient psychiatric hospitalization and he was started on Saphris as per psychiatry  recommendations. -Psychiatry reconsulted and patient is no longer needing inpatient psych admission. Does not need to be discharged with Saphris.   Hypertension -Continue amlodipine, metoprolol  Diabetes mellitus type 2 -Continue Levemir, insulin sliding scale, CBG monitoring  Tobacco abuse -Patient was counseled regarding tobacco cessation during the hospitalization by PCCM team -Continue nicotine patch  Consultants PCCM  Procedures  None  Discharge Exam: Vitals:   06/06/19 1635 06/06/19 2048  Pulse:  71  Resp:  19  Temp: 97.7 F (36.5 C) 98.1 F (36.7 C)  SpO2:  98%     General: Well developed, well nourished, NAD, appears stated age  HEENT: NCAT,mucous membranes moist.  Cardiovascular: S1 S2 auscultated, RRR  Respiratory: Clear to auscultation bilaterally   Abdomen: Soft, nontender, nondistended, + bowel sounds  Extremities: warm dry without cyanosis clubbing or edema  Neuro: AAOx3, nonfocal  Psych: Appropriate mood and affect  Discharge Instructions Discharge Instructions    Diet - low sodium heart healthy   Complete by: As directed    Diet Carb Modified   Complete by: As directed    Discharge instructions   Complete by: As directed    Patient will be discharged to home.  Patient will need to follow up with primary care provider within one week of discharge.  Patient should continue medications as prescribed.  Patient should follow a heart healthy/carb modified diet.   Increase activity slowly   Complete by: As directed      Allergies as of 06/07/2019      Reactions   Quetiapine Anaphylaxis   Seroquel [quetiapine Fumarate] Anxiety, Other (See Comments)   Reaction:  Nightmares    Trazodone Anaphylaxis   Trazodone And Nefazodone Anxiety, Other (See Comments)   Other reaction(s): Unknown Nightmares  Reaction:  Nightmares    Vistaril [hydroxyzine Hcl] Nausea And Vomiting   Haldol [haloperidol Lactate] Anxiety, Other (See Comments)    Reaction:  Nightmares       Medication List    STOP taking these medications   ibuprofen 800 MG tablet Commonly known as: ADVIL     TAKE these medications   albuterol 108 (90 Base) MCG/ACT inhaler Commonly known as: VENTOLIN HFA Inhale 2 puffs into the lungs every 6 (six) hours as needed for wheezing or shortness of breath.   aspirin EC 81 MG tablet Take 81 mg by mouth daily.   atorvastatin 40 MG tablet Commonly known as: LIPITOR Take 1 tablet (40 mg total) by mouth daily at 6 PM.   baclofen 10 MG tablet Commonly known as: LIORESAL Take 10 mg by mouth 3 (three) times daily.   budesonide-formoterol 80-4.5 MCG/ACT inhaler Commonly known as: SYMBICORT Inhale 2 puffs into the lungs 2 (two) times daily.   folic acid 1 MG tablet Commonly known as: FOLVITE Take 1 tablet (1 mg total) by mouth daily.   gabapentin 400 MG capsule Commonly known as: NEURONTIN Take 2 capsules (800 mg total) by mouth 3 (three) times daily.   hydrOXYzine 10 MG tablet Commonly known as: ATARAX/VISTARIL Take 10 mg by mouth 3 (three) times daily.   isosorbide mononitrate 30 MG 24 hr tablet Commonly known as: IMDUR Take 30 mg by mouth daily.   Levemir 100 UNIT/ML injection Generic drug: insulin detemir Inject 20 Units into the skin at bedtime.   lisinopril 10 MG tablet Commonly known as: ZESTRIL Take 1 tablet by mouth daily.   metFORMIN 500 MG 24 hr tablet Commonly known as: GLUCOPHAGE-XR Take 2 tablets (1,000 mg total) by mouth 2 (two) times daily with a meal. For diabetes management   metoprolol succinate 25 MG 24 hr tablet Commonly known as: TOPROL-XL Take 25 mg by mouth 2 (two) times daily.   nicotine 21 mg/24hr patch Commonly known as: NICODERM CQ - dosed in mg/24 hours Place 1 patch (21 mg total) onto the skin at bedtime.   nitroGLYCERIN 0.4 MG SL tablet Commonly known as: NITROSTAT Place 0.4 mg under the tongue every 5 (five) minutes as needed for chest pain.   NovoLOG 100  UNIT/ML injection Generic drug: insulin aspart Inject 1-30 Units into the skin 3 (three) times daily with meals. SLIDING SCALE   omeprazole 20 MG capsule Commonly known as: PRILOSEC Take 20 mg by mouth daily as needed (heartburn).   paliperidone 234 MG/1.5ML Susp injection Commonly known as: INVEGA SUSTENNA Inject 234 mg into the muscle every 30 (thirty) days. (Due on 11-14-17): For mood control   thiamine 100 MG tablet Take 1 tablet (100 mg total) by mouth daily.      Allergies  Allergen Reactions  . Quetiapine Anaphylaxis  . Seroquel [Quetiapine Fumarate] Anxiety and Other (See Comments)    Reaction:  Nightmares   . Trazodone Anaphylaxis  . Trazodone And Nefazodone Anxiety and Other (See Comments)    Other reaction(s): Unknown Nightmares Reaction:  Nightmares   . Vistaril [Hydroxyzine Hcl] Nausea And Vomiting  . Haldol [Haloperidol Lactate] Anxiety and Other (See Comments)    Reaction:  Nightmares    Follow-up Information    Celene Squibb, MD. Schedule an appointment as soon as possible for a visit in 1 week(s).   Specialty: Internal Medicine Why: Hospital follow up Contact information: [place] [address] [phone]  The results of significant diagnostics from this hospitalization (including imaging, microbiology, ancillary and laboratory) are listed below for reference.    Significant Diagnostic Studies: No results found.  Microbiology: Recent Results (from the past 240 hour(s))  SARS CORONAVIRUS 2 Nasal Swab Aptima Multi Swab     Status: None   Collection Time: 05/29/19  8:42 PM   Specimen: Aptima Multi Swab; Nasal Swab  Result Value Ref Range Status   SARS Coronavirus 2 NEGATIVE NEGATIVE Final    Comment: (NOTE) SARS-CoV-2 target nucleic acids are NOT DETECTED. The SARS-CoV-2 RNA is generally detectable in upper and lower respiratory specimens during the acute phase of infection. Negative results do not preclude  SARS-CoV-2 infection, do not rule out co-infections with other pathogens, and should not be used as the sole basis for treatment or other patient management decisions. Negative results must be combined with clinical observations, patient history, and epidemiological information. The expected result is Negative. Fact Sheet for Patients: SugarRoll.be Fact Sheet for Healthcare Providers: https://www.woods-mathews.com/ This test is not yet approved or cleared by the [place] FDA and  has been authorized for detection and/or diagnosis of SARS-CoV-2 by FDA under an Emergency Use Authorization (EUA). This EUA will remain  in effect (meaning this test can be used) for the duration of the COVID-19 declaration under Section 56 4(b)(1) of the Act, 21 U.S.C. section 360bbb-3(b)(1), unless the authorization is terminated or revoked sooner. Performed at [facility], [place] [address] [place], [place] [address]   MRSA PCR Screening     Status: None   Collection Time: 05/30/19  3:58 AM   Specimen: Nasal Mucosa; Nasopharyngeal  Result Value Ref Range Status   MRSA by PCR NEGATIVE NEGATIVE Final    Comment:        The GeneXpert MRSA Assay (FDA approved for NASAL specimens only), is one component of a comprehensive MRSA colonization surveillance program. It is not intended to diagnose MRSA infection nor to guide or monitor treatment for MRSA infections. Performed at [facility], [place] [address] [place], [place] [address]   SARS Coronavirus 2 [facility] order, Performed in [facility] hospital lab) Nasopharyngeal Nasopharyngeal Swab     Status: None   Collection Time: 05/30/19  4:12 AM   Specimen: Nasopharyngeal Swab  Result Value Ref Range Status   SARS Coronavirus 2 NEGATIVE NEGATIVE Final    Comment: (NOTE) If result is NEGATIVE SARS-CoV-2 target nucleic acids are NOT DETECTED. The SARS-CoV-2 RNA is generally detectable in upper and  lower  respiratory specimens during the acute phase of infection. The lowest  concentration of SARS-CoV-2 viral copies this assay can detect is 250  copies / mL. A negative result does not preclude SARS-CoV-2 infection  and should not be used as the sole basis for treatment or other  patient management decisions.  A negative result may occur with  improper specimen collection / handling, submission of specimen other  than nasopharyngeal swab, presence of viral mutation(s) within the  areas targeted by this assay, and inadequate number of viral copies  (<250 copies / mL). A negative result must be combined with clinical  observations, patient history, and epidemiological information. If result is POSITIVE SARS-CoV-2 target nucleic acids are DETECTED. The SARS-CoV-2 RNA is generally detectable in upper and lower  respiratory specimens dur ing the acute phase of infection.  Positive  results are indicative of active infection with SARS-CoV-2.  Clinical  correlation with patient history and other diagnostic information is  necessary  to determine patient infection status.  Positive results do  not rule out bacterial infection or co-infection with other viruses. If result is PRESUMPTIVE POSTIVE SARS-CoV-2 nucleic acids MAY BE PRESENT.   A presumptive positive result was obtained on the submitted specimen  and confirmed on repeat testing.  While 2019 novel coronavirus  (SARS-CoV-2) nucleic acids may be present in the submitted sample  additional confirmatory testing may be necessary for epidemiological  and / or clinical management purposes  to differentiate between  SARS-CoV-2 and other Sarbecovirus currently known to infect humans.  If clinically indicated additional testing with an alternate test  methodology [phone]) is advised. The SARS-CoV-2 RNA is generally  detectable in upper and lower respiratory sp ecimens during the acute  phase of infection. The expected result is Negative.  Fact Sheet for Patients:  StrictlyIdeas.no Fact Sheet for Healthcare Providers: BankingDealers.co.za This test is not yet approved or cleared by the [place] FDA and has been authorized for detection and/or diagnosis of SARS-CoV-2 by FDA under an Emergency Use Authorization (EUA).  This EUA will remain in effect (meaning this test can be used) for the duration of the COVID-19 declaration under Section 564(b)(1) of the Act, 21 U.S.C. section 360bbb-3(b)(1), unless the authorization is terminated or revoked sooner. Performed at [facility], [place] [address] [place], [place] [address]      Labs: Basic Metabolic Panel: Recent Labs  Lab 06/06/19 [phone]  CREATININE 1.17   Liver Function Tests: No results for input(s): AST, ALT, ALKPHOS, BILITOT, PROT, ALBUMIN in the last 168 hours. No results for input(s): LIPASE, AMYLASE in the last 168 hours. No results for input(s): AMMONIA in the last 168 hours. CBC: No results for input(s): WBC, NEUTROABS, HGB, HCT, MCV, PLT in the last 168 hours. Cardiac Enzymes: No results for input(s): CKTOTAL, CKMB, CKMBINDEX, TROPONINI in the last 168 hours. BNP: BNP (last 3 results) No results for input(s): BNP in the last 8760 hours.  ProBNP (last 3 results) No results for input(s): PROBNP in the last 8760 hours.  CBG: Recent Labs  Lab 06/06/19 0712 06/06/19 1133 06/06/19 1635 06/06/19 2045 06/07/19 0639  GLUCAP 200* 183* 211* 177* 285*       Signed:  [facility] 06/07/2019, 2:08 PM

## 2019-06-07 NOTE — Plan of Care (Signed)
  Problem: Education: Goal: Knowledge of General Education information will improve Description: Including pain rating scale, medication(s)/side effects and non-pharmacologic comfort measures Outcome: Adequate for Discharge   Problem: Health Behavior/Discharge Planning: Goal: Ability to manage health-related needs will improve 06/07/2019 1547 by Baldo Ash, RN Outcome: Adequate for Discharge 06/07/2019 0845 by Baldo Ash, RN Outcome: Progressing   Problem: Clinical Measurements: Goal: Ability to maintain clinical measurements within normal limits will improve Outcome: Adequate for Discharge Goal: Will remain free from infection Outcome: Adequate for Discharge Goal: Diagnostic test results will improve Outcome: Adequate for Discharge Goal: Respiratory complications will improve Outcome: Adequate for Discharge Goal: Cardiovascular complication will be avoided Outcome: Adequate for Discharge   Problem: Nutrition: Goal: Adequate nutrition will be maintained Outcome: Adequate for Discharge   Problem: Coping: Goal: Level of anxiety will decrease Outcome: Adequate for Discharge   Problem: Elimination: Goal: Will not experience complications related to bowel motility Outcome: Adequate for Discharge Goal: Will not experience complications related to urinary retention Outcome: Adequate for Discharge   Problem: Pain Managment: Goal: General experience of comfort will improve Outcome: Adequate for Discharge   Problem: Skin Integrity: Goal: Risk for impaired skin integrity will decrease Outcome: Adequate for Discharge

## 2019-06-07 NOTE — Progress Notes (Signed)
[name] to be discharged home per MD order. Discussed prescriptions and follow up appointments with the patient. Prescriptions given to patient; medication list explained in detail. Patient verbalized understanding.  Skin clean, dry and intact without evidence of skin break down, no evidence of skin tears noted. IV catheter discontinued intact. Site without signs and symptoms of complications. Dressing and pressure applied. Pt denies pain at the site currently. No complaints noted.  Patient free of lines, drains, and wounds.   An After Visit Summary (AVS) was printed and given to the patient. Patient escorted via wheelchair, and discharged home via private auto.  Baldo Ash, RN

## 2019-06-07 NOTE — Plan of Care (Signed)
  Problem: Health Behavior/Discharge Planning: Goal: Ability to manage health-related needs will improve Outcome: Progressing   

## 2019-06-15 ENCOUNTER — Other Ambulatory Visit: Payer: Self-pay

## 2019-06-15 ENCOUNTER — Emergency Department (HOSPITAL_COMMUNITY)
Admission: EM | Admit: 2019-06-15 | Discharge: 2019-06-16 | Disposition: A | Discharge status: Home / Self Care | Payer: Medicare Other | Attending: Emergency Medicine | Admitting: Emergency Medicine

## 2019-06-15 ENCOUNTER — Encounter (HOSPITAL_COMMUNITY): Payer: Self-pay | Admitting: Emergency Medicine

## 2019-06-15 DIAGNOSIS — F259 Schizoaffective disorder, unspecified: Secondary | ICD-10-CM | POA: Diagnosis not present

## 2019-06-15 DIAGNOSIS — E119 Type 2 diabetes mellitus without complications: Secondary | ICD-10-CM | POA: Insufficient documentation

## 2019-06-15 DIAGNOSIS — F17210 Nicotine dependence, cigarettes, uncomplicated: Secondary | ICD-10-CM | POA: Diagnosis not present

## 2019-06-15 DIAGNOSIS — R45851 Suicidal ideations: Secondary | ICD-10-CM | POA: Insufficient documentation

## 2019-06-15 DIAGNOSIS — J449 Chronic obstructive pulmonary disease, unspecified: Secondary | ICD-10-CM | POA: Insufficient documentation

## 2019-06-15 DIAGNOSIS — T50904A Poisoning by unspecified drugs, medicaments and biological substances, undetermined, initial encounter: Secondary | ICD-10-CM | POA: Diagnosis not present

## 2019-06-15 DIAGNOSIS — Z79899 Other long term (current) drug therapy: Secondary | ICD-10-CM | POA: Diagnosis not present

## 2019-06-15 DIAGNOSIS — I10 Essential (primary) hypertension: Secondary | ICD-10-CM | POA: Insufficient documentation

## 2019-06-15 DIAGNOSIS — E1165 Type 2 diabetes mellitus with hyperglycemia: Secondary | ICD-10-CM | POA: Diagnosis not present

## 2019-06-15 DIAGNOSIS — T887XXA Unspecified adverse effect of drug or medicament, initial encounter: Secondary | ICD-10-CM | POA: Diagnosis not present

## 2019-06-15 DIAGNOSIS — R439 Unspecified disturbances of smell and taste: Secondary | ICD-10-CM | POA: Diagnosis present

## 2019-06-15 DIAGNOSIS — Z20828 Contact with and (suspected) exposure to other viral communicable diseases: Secondary | ICD-10-CM | POA: Diagnosis not present

## 2019-06-15 DIAGNOSIS — F251 Schizoaffective disorder, depressive type: Secondary | ICD-10-CM | POA: Diagnosis present

## 2019-06-15 LAB — CBC WITH DIFFERENTIAL/PLATELET
Abs Immature Granulocytes: 0.02 10*3/uL (ref 0.00–0.07)
Basophils Absolute: 0.1 10*3/uL (ref 0.0–0.1)
Basophils Relative: 2 %
Eosinophils Absolute: 0.1 10*3/uL (ref 0.0–0.5)
Eosinophils Relative: 2 %
HCT: 43.3 % (ref 39.0–52.0)
Hemoglobin: 15 g/dL (ref 13.0–17.0)
Immature Granulocytes: 0 %
Lymphocytes Relative: 18 %
Lymphs Abs: 0.8 10*3/uL (ref 0.7–4.0)
MCH: 30.5 pg (ref 26.0–34.0)
MCHC: 34.6 g/dL (ref 30.0–36.0)
MCV: 88 fL (ref 80.0–100.0)
Monocytes Absolute: 0.5 10*3/uL (ref 0.1–1.0)
Monocytes Relative: 10 %
Neutro Abs: 3.1 10*3/uL (ref 1.7–7.7)
Neutrophils Relative %: 68 %
Platelets: 145 10*3/uL — ABNORMAL LOW (ref 150–400)
RBC: 4.92 MIL/uL (ref 4.22–5.81)
RDW: 14.6 % (ref 11.5–15.5)
WBC: 4.5 10*3/uL (ref 4.0–10.5)
nRBC: 0 % (ref 0.0–0.2)

## 2019-06-15 LAB — COMPREHENSIVE METABOLIC PANEL
ALT: 27 U/L (ref 0–44)
AST: 19 U/L (ref 15–41)
Albumin: 4.6 g/dL (ref 3.5–5.0)
Alkaline Phosphatase: 84 U/L (ref 38–126)
Anion gap: 11 (ref 5–15)
BUN: 9 mg/dL (ref 6–20)
CO2: 23 mmol/L (ref 22–32)
Calcium: 9.7 mg/dL (ref 8.9–10.3)
Chloride: 96 mmol/L — ABNORMAL LOW (ref 98–111)
Creatinine, Ser: 1.28 mg/dL — ABNORMAL HIGH (ref 0.61–1.24)
GFR calc Af Amer: >60 mL/min (ref >60)
GFR calc non Af Amer: >60 mL/min (ref >60)
Glucose, Bld: 356 mg/dL — ABNORMAL HIGH (ref 70–99)
Potassium: 4.2 mmol/L (ref 3.5–5.1)
Sodium: 130 mmol/L — ABNORMAL LOW (ref 135–145)
Total Bilirubin: 0.4 mg/dL (ref 0.3–1.2)
Total Protein: 7.7 g/dL (ref 6.5–8.1)

## 2019-06-15 LAB — RAPID URINE DRUG SCREEN, HOSP PERFORMED
Amphetamines: NOT DETECTED
Barbiturates: NOT DETECTED
Benzodiazepines: NOT DETECTED
Cocaine: NOT DETECTED
Opiates: NOT DETECTED
Tetrahydrocannabinol: NOT DETECTED

## 2019-06-15 LAB — URINALYSIS, ROUTINE W REFLEX MICROSCOPIC
Bacteria, UA: NONE SEEN
Bilirubin Urine: NEGATIVE
Glucose, UA: >=500 mg/dL — AB
Hgb urine dipstick: NEGATIVE
Ketones, ur: NEGATIVE mg/dL
Leukocytes,Ua: NEGATIVE
Nitrite: NEGATIVE
Protein, ur: NEGATIVE mg/dL
Specific Gravity, Urine: 1.003 — ABNORMAL LOW (ref 1.005–1.030)
pH: 6 (ref 5.0–8.0)

## 2019-06-15 LAB — ETHANOL: Alcohol, Ethyl (B): <10 mg/dL (ref <10)

## 2019-06-15 LAB — LIPASE, BLOOD: Lipase: 57 U/L — ABNORMAL HIGH (ref 11–51)

## 2019-06-15 LAB — ACETAMINOPHEN LEVEL: Acetaminophen (Tylenol), Serum: <10 ug/mL — ABNORMAL LOW (ref 10–30)

## 2019-06-15 LAB — SALICYLATE LEVEL: Salicylate Lvl: <7 mg/dL (ref 2.8–30.0)

## 2019-06-15 LAB — CBG MONITORING, ED: Glucose-Capillary: 269 mg/dL — ABNORMAL HIGH (ref 70–99)

## 2019-06-15 MED ORDER — BACLOFEN 10 MG PO TABS
10.0000 mg | ORAL_TABLET | Freq: Three times a day (TID) | ORAL | Status: DC
  Administered 2019-06-16 (×2): 10 mg via ORAL
  Filled 2019-06-15 (×3): qty 1

## 2019-06-15 MED ORDER — INSULIN DETEMIR 100 UNIT/ML ~~LOC~~ SOLN
20.0000 [IU] | Freq: Every day | SUBCUTANEOUS | Status: DC
  Filled 2019-06-15 (×3): qty 0.2

## 2019-06-15 MED ORDER — ACETAMINOPHEN 325 MG PO TABS
650.0000 mg | ORAL_TABLET | Freq: Four times a day (QID) | ORAL | Status: DC | PRN
  Administered 2019-06-16 (×2): 650 mg via ORAL
  Filled 2019-06-15 (×2): qty 2

## 2019-06-15 MED ORDER — INSULIN ASPART 100 UNIT/ML ~~LOC~~ SOLN
0.0000 [IU] | Freq: Three times a day (TID) | SUBCUTANEOUS | Status: DC
  Administered 2019-06-16: 11 [IU] via SUBCUTANEOUS
  Filled 2019-06-15 (×2): qty 1

## 2019-06-15 MED ORDER — ALUM & MAG HYDROXIDE-SIMETH 200-200-20 MG/5ML PO SUSP: 30.0000 mL | Freq: Four times a day (QID) | ORAL | Status: DC | PRN

## 2019-06-15 MED ORDER — ZIPRASIDONE MESYLATE 20 MG IM SOLR: 10.0000 mg | Freq: Once | INTRAMUSCULAR | Status: DC

## 2019-06-15 MED ORDER — METFORMIN HCL ER 500 MG PO TB24
1000.0000 mg | ORAL_TABLET | Freq: Two times a day (BID) | ORAL | Status: DC
  Administered 2019-06-16: 1000 mg via ORAL
  Filled 2019-06-15 (×6): qty 2

## 2019-06-15 MED ORDER — ASPIRIN EC 81 MG PO TBEC
81.0000 mg | DELAYED_RELEASE_TABLET | Freq: Every day | ORAL | Status: DC
  Administered 2019-06-16: 12:00:00 81 mg via ORAL
  Filled 2019-06-15 (×2): qty 1

## 2019-06-15 MED ORDER — VITAMIN B-1 100 MG PO TABS
100.0000 mg | ORAL_TABLET | Freq: Every day | ORAL | Status: DC
  Administered 2019-06-16: 100 mg via ORAL
  Filled 2019-06-15 (×2): qty 1

## 2019-06-15 MED ORDER — LISINOPRIL 10 MG PO TABS
10.0000 mg | ORAL_TABLET | Freq: Every day | ORAL | Status: DC
  Administered 2019-06-16: 10 mg via ORAL
  Filled 2019-06-15 (×3): qty 1

## 2019-06-15 MED ORDER — ALBUTEROL SULFATE HFA 108 (90 BASE) MCG/ACT IN AERS: 2.0000 | INHALATION_SPRAY | Freq: Four times a day (QID) | RESPIRATORY_TRACT | Status: DC | PRN

## 2019-06-15 MED ORDER — FOLIC ACID 1 MG PO TABS
1.0000 mg | ORAL_TABLET | Freq: Every day | ORAL | Status: DC
  Administered 2019-06-16: 12:00:00 1 mg via ORAL
  Filled 2019-06-15 (×2): qty 1

## 2019-06-15 MED ORDER — PALIPERIDONE ER 6 MG PO TB24
6.0000 mg | ORAL_TABLET | Freq: Every day | ORAL | Status: DC
  Filled 2019-06-15 (×3): qty 1

## 2019-06-15 MED ORDER — NICOTINE 21 MG/24HR TD PT24
21.0000 mg | MEDICATED_PATCH | Freq: Every day | TRANSDERMAL | Status: DC
  Administered 2019-06-15 – 2019-06-16 (×2): 21 mg via TRANSDERMAL
  Filled 2019-06-15 (×2): qty 1

## 2019-06-15 MED ORDER — ATORVASTATIN CALCIUM 40 MG PO TABS
40.0000 mg | ORAL_TABLET | Freq: Every day | ORAL | Status: DC
  Filled 2019-06-15: qty 1

## 2019-06-15 MED ORDER — METOPROLOL SUCCINATE ER 25 MG PO TB24
25.0000 mg | ORAL_TABLET | Freq: Two times a day (BID) | ORAL | Status: DC
  Administered 2019-06-16 (×2): 25 mg via ORAL
  Filled 2019-06-15 (×3): qty 1

## 2019-06-15 MED ORDER — ISOSORBIDE MONONITRATE ER 30 MG PO TB24
30.0000 mg | ORAL_TABLET | Freq: Every day | ORAL | Status: DC
  Administered 2019-06-16: 30 mg via ORAL
  Filled 2019-06-15 (×4): qty 1

## 2019-06-15 MED ORDER — OXYCODONE-ACETAMINOPHEN 5-325 MG PO TABS
2.0000 | ORAL_TABLET | Freq: Once | ORAL | Status: AC
  Administered 2019-06-15: 12:00:00 2 via ORAL
  Filled 2019-06-15: qty 2

## 2019-06-15 MED ORDER — ZIPRASIDONE MESYLATE 20 MG IM SOLR
10.0000 mg | Freq: Four times a day (QID) | INTRAMUSCULAR | Status: DC | PRN
  Administered 2019-06-16: 10 mg via INTRAMUSCULAR
  Filled 2019-06-15: qty 20

## 2019-06-15 MED ORDER — GABAPENTIN 400 MG PO CAPS
800.0000 mg | ORAL_CAPSULE | Freq: Three times a day (TID) | ORAL | Status: DC
  Administered 2019-06-15 – 2019-06-16 (×3): 800 mg via ORAL
  Filled 2019-06-15 (×3): qty 2

## 2019-06-15 NOTE — ED Notes (Signed)
Pt cussing, being rude to staff, and yelling. Informed pt that we will not tolerate that behavior, and he needs to stop yelling and cussing. Pt asking for ativan - pt informed that ativan is not ordered, informed pt that the Dr has ordered invaga tablet, but pt says he will not take that.

## 2019-06-15 NOTE — ED Notes (Signed)
Pt requesting something for pain- DR Sabra Heck made aware.

## 2019-06-15 NOTE — ED Triage Notes (Signed)
Woke up with strong smelling of bug spray and "pot".

## 2019-06-15 NOTE — BH Assessment (Signed)
[place] Assessment Progress Note Case was staffed with Silvio Pate FNP who recommended patient be observed and seen by psychiatry in the a.m.
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
## 2019-06-15 NOTE — ED Provider Notes (Signed)
Emergency Department Provider Note   I have reviewed the triage vital signs and the nursing notes.   HISTORY  Chief Complaint Poisoning   HPI [name] is a 58 y.o. male with PMH of schizophrenia, EtOH abuse, DM, COPD, and HTN presents to the emergency department by EMS with multiple complaints.  He states that last night while he was sleeping he woke up with a taste of "strong pot and bug spray" in his mouth. He tells me "I thought that they were just trying to scare me because I am highly allergic to pot."  He will not tell me who "they" are specifically and is very tangential.  Patient is followed by the ACT team and reports compliance with his IM antipsychotics.  He adamantly denies taking anything such as antifreeze or other substances to intentionally harm himself last night.  He states this occurred while he was sleeping.  He also tells me that when he sees certain colors he develops pain in his shoulders.   When asked about suicidal thoughts he states that " yes.  I was trying to hang myself yesterday but could not find a tree or rope or ladder so I could not do it."  He denies thoughts of harming other people.  He is describing pain in his shoulders and requesting his home dose of Percocet. He does endorse drinking EtOH.   Level 5 caveat: Acute psychosis and disorganized thinking.    Past Medical History:  Diagnosis Date  . Alcohol dependence ([place]) 1979   stated abusing ETOH at age 13   . Back pain   . Benzodiazepine dependence ([place])   . Cardiac arrest ([place])   . Chronic back pain   . Cocaine abuse ([place])   . COPD (chronic obstructive pulmonary disease) ([place])   . Diabetes mellitus without complication ([place]) [id number]  . Hypertension 2008  . MI (myocardial infarction) ([place])   . Non-compliance   . Schizoaffective disorder ([place]) 2006   . Substance abuse [facility])     Patient Active Problem List   Diagnosis Date Noted  . Poisoning, antifreeze   . Suicide attempt ([place])   .  Overdose 04/14/2018  . MDD (major depressive disorder) 11/12/2017  . 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 Diabetic neuropathy ([place]) 11/11/2016  . Anxiety and depression 11/11/2016  . Chronic insomnia 01/08/2016  . Alcohol use disorder 12/10/2015  . Benzodiazepine abuse ([place]) 12/10/2015  . Opioid abuse ([place]) 12/10/2015  . Constipation 02/06/2015  . BPH (benign prostatic hyperplasia) 01/16/2015  . Vitamin D deficiency 01/16/2015  . Allergic rhinitis 11/19/2014  . Chronic low back pain 11/07/2014  . Onychomycosis of toenail 11/07/2014  . Tinea pedis 11/07/2014  . Psoriasis 10/14/2014  . HTN (hypertension) 10/14/2014  . Diabetes mellitus type 2, controlled ([place]) 10/14/2014  . COPD (chronic obstructive pulmonary disease) ([place]) 10/14/2014  . Tobacco abuse 10/14/2014  . HLD (hyperlipidemia) 10/14/2014  . Schizoaffective disorder, depressive type ([place]) 10/14/2014    Past Surgical History:  Procedure Laterality Date  . CYST EXCISION      Allergies Quetiapine, Seroquel [quetiapine fumarate], Trazodone, Trazodone and nefazodone, Vistaril [hydroxyzine hcl], and Haldol [haloperidol lactate]  Family History  Problem Relation Age of Onset  . Heart disease Mother   . Heart disease Father   . Heart disease Maternal Grandmother   . Diabetes Maternal Grandmother     Social History Social History   Tobacco Use  . Smoking status: Current Every Day  Smoker    Packs/day: 1.50    Types: Cigarettes  . Smokeless tobacco: Never Used  Substance Use Topics  . Alcohol use: Yes    Alcohol/week: 0.0 standard drinks    Comment: last drank week ago  . Drug use: Not Currently    Types: Cocaine    Comment: Denies any use in past 6 months    Review of Systems  Constitutional: No fever/chills Eyes: No visual changes. ENT: No sore throat. Bad taste in mouth last night (resolved) Cardiovascular: Denies chest pain. Respiratory: Denies  shortness of breath. Gastrointestinal: No abdominal pain.  No nausea, no vomiting.  No diarrhea.  No constipation. Genitourinary: Negative for dysuria. Musculoskeletal: Negative for back pain. Positive bilateral shoulder pain.  Skin: Negative for rash. Neurological: Negative for headaches, focal weakness or numbness. Psych: Positive SI with plan.  10-point ROS otherwise negative.  ____________________________________________   PHYSICAL EXAM:  VITAL SIGNS: ED Triage Vitals  Enc Vitals Group     BP --      Pulse --      Resp 06/15/19 1052 18     Temp 06/15/19 1052 98.2 F (36.8 C)     Temp Source 06/15/19 1052 Oral     SpO2 06/15/19 1052 98 %     Weight 06/15/19 1054 225 lb (102.1 kg)     Height 06/15/19 1054 5\' 9"  (1.753 m)   Constitutional: Alert and oriented. Patient with somewhat pressured speech and tangential.  Eyes: Conjunctivae are normal.  Head: Atraumatic.  Nose: No congestion/rhinnorhea. Mouth/Throat: Mucous membranes are moist.  Neck: No stridor.  Cardiovascular: Normal rate, regular rhythm. Good peripheral circulation. Grossly normal heart sounds.   Respiratory: Normal respiratory effort.  No retractions. Lungs CTAB. Gastrointestinal: Soft and nontender. Positive distention.  Musculoskeletal: No lower extremity tenderness nor edema. No gross deformities of extremities. Neurologic:  Normal speech and language. No gross focal neurologic deficits are appreciated.  Skin:  Skin is warm, dry and intact. No rash noted. Psychiatric: Patient is very talkative and tangential.  Speech is clear and thought process disorganized. Not responding to internal stimuli.   ____________________________________________   LABS (all labs ordered are listed, but only abnormal results are displayed)  Labs Reviewed  ACETAMINOPHEN LEVEL - Abnormal; Notable for the following components:      Result Value   Acetaminophen (Tylenol), Serum <10 (*)    All other components within normal  limits  COMPREHENSIVE METABOLIC PANEL - Abnormal; Notable for the following components:   Sodium 130 (*)    Chloride 96 (*)    Glucose, Bld 356 (*)    Creatinine, Ser 1.28 (*)    All other components within normal limits  LIPASE, BLOOD - Abnormal; Notable for the following components:   Lipase 57 (*)    All other components within normal limits  CBC WITH DIFFERENTIAL/PLATELET - Abnormal; Notable for the following components:   Platelets 145 (*)    All other components within normal limits  URINALYSIS, ROUTINE W REFLEX MICROSCOPIC - Abnormal; Notable for the following components:   Color, Urine STRAW (*)    Specific Gravity, Urine 1.003 (*)    Glucose, UA >=500 (*)    All other components within normal limits  SARS CORONAVIRUS 2 (HOSPITAL ORDER, [place] [facility])  ETHANOL  SALICYLATE LEVEL  RAPID URINE DRUG SCREEN, HOSP PERFORMED   ____________________________________________  EKG   EKG Interpretation  Date/Time:  Friday June 15 2019 11:41:35 EDT Ventricular Rate:  95 PR Interval:  QRS Duration: 87 QT Interval:  374 QTC Calculation: 471 R Axis:   -10 Text Interpretation:  Sinus rhythm No STEMI  Confirmed by Nanda Quinton [phone]) on 06/15/2019 11:56:46 AM      ____________________________________________   PROCEDURES  Procedure(s) performed:   Procedures  None  ____________________________________________   INITIAL IMPRESSION / ASSESSMENT AND PLAN / ED COURSE  Pertinent labs & imaging results that were available during my care of the patient were reviewed by me and considered in my medical decision making (see chart for details).   Patient presents to the emergency department for evaluation of bad taste in his mouth which occurred last night.  Very atypical type story.  He adamantly denies taking any medications or other substances to harm himself.  He does endorse suicidal thinking which was mostly yesterday with plan to hang himself.  He  is very tangential, disorganized.  Given his suicidal statements and general presentation I have elected to IVC the patient.  I discussed this with him and he is in agreement.  Patient given a dose of pain medication for his chronic shoulder pain.  Will obtain screening labs.  No clear history of acute poisoning or ingestion. QTc normal on EKG. Geodon ordered.   Labs reviewed. Patient is medically clear. Restarted home medications. COVID pending. IVC filed.  ____________________________________________  FINAL CLINICAL IMPRESSION(S) / ED DIAGNOSES  Final diagnoses:  Suicidal ideation     MEDICATIONS GIVEN DURING THIS VISIT:  Medications  nicotine (NICODERM CQ - dosed in mg/24 hours) patch 21 mg (has no administration in time range)  alum & mag hydroxide-simeth (MAALOX/MYLANTA) 200-200-20 MG/5ML suspension 30 mL (has no administration in time range)  ziprasidone (GEODON) injection 10 mg (has no administration in time range)  albuterol (VENTOLIN HFA) 108 (90 Base) MCG/ACT inhaler 2 puff (has no administration in time range)  aspirin EC tablet 81 mg (has no administration in time range)  atorvastatin (LIPITOR) tablet 40 mg (has no administration in time range)  baclofen (LIORESAL) tablet 10 mg (has no administration in time range)  folic acid (FOLVITE) tablet 1 mg (has no administration in time range)  gabapentin (NEURONTIN) capsule 800 mg (has no administration in time range)  isosorbide mononitrate (IMDUR) 24 hr tablet 30 mg (has no administration in time range)  insulin detemir (LEVEMIR) injection 20 Units (has no administration in time range)  lisinopril (ZESTRIL) tablet 10 mg (has no administration in time range)  metFORMIN (GLUCOPHAGE-XR) 24 hr tablet 1,000 mg (has no administration in time range)  metoprolol succinate (TOPROL-XL) 24 hr tablet 25 mg (has no administration in time range)  thiamine (VITAMIN B-1) tablet 100 mg (has no administration in time range)  insulin aspart  (novoLOG) injection 0-15 Units (has no administration in time range)  oxyCODONE-acetaminophen (PERCOCET/ROXICET) 5-325 MG per tablet 2 tablet (2 tablets Oral Given 06/15/19 1135)    Note:  This document was prepared using Dragon voice recognition software and may include unintentional dictation errors.  Nanda Quinton, MD Emergency Medicine   Mao Lockner, [facility], MD 06/15/19 [phone]

## 2019-06-16 DIAGNOSIS — F259 Schizoaffective disorder, unspecified: Secondary | ICD-10-CM | POA: Diagnosis not present

## 2019-06-16 LAB — CBG MONITORING, ED
Glucose-Capillary: 243 mg/dL — ABNORMAL HIGH (ref 70–99)
Glucose-Capillary: 310 mg/dL — ABNORMAL HIGH (ref 70–99)

## 2019-06-16 MED ORDER — STERILE WATER FOR INJECTION IJ SOLN
INTRAMUSCULAR | Status: AC
  Administered 2019-06-16: 1.2 mL
  Filled 2019-06-16: qty 10

## 2019-06-16 NOTE — ED Notes (Signed)
Rescinded IVC papers faxed to Magistrate. 

## 2019-06-16 NOTE — Discharge Instructions (Signed)
Take your usual prescriptions as previously directed.  Call your regular medical doctor and your mental health provider on Monday to schedule a follow up appointment this week.  Return to the Emergency Department immediately sooner if worsening.

## 2019-06-16 NOTE — ED Provider Notes (Signed)
Psych team has re-evaluated pt today: Pt does not meet inpt criteria at this time and can be d/c to f/u with his ACT team. Will d/c stable.    Francine Graven, DO 06/16/19 1455
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
## 2019-06-16 NOTE — ED Notes (Signed)
Pt asking when he will get moved to a room- informed pt that we do not have any rooms available at this time.

## 2019-06-23 DIAGNOSIS — R457 State of emotional shock and stress, unspecified: Secondary | ICD-10-CM | POA: Diagnosis not present

## 2019-06-23 DIAGNOSIS — I10 Essential (primary) hypertension: Secondary | ICD-10-CM | POA: Diagnosis not present

## 2019-06-23 DIAGNOSIS — R52 Pain, unspecified: Secondary | ICD-10-CM | POA: Diagnosis not present

## 2019-06-23 DIAGNOSIS — F419 Anxiety disorder, unspecified: Secondary | ICD-10-CM | POA: Diagnosis not present

## 2019-06-23 DIAGNOSIS — F17200 Nicotine dependence, unspecified, uncomplicated: Secondary | ICD-10-CM | POA: Diagnosis not present

## 2019-06-23 DIAGNOSIS — R5381 Other malaise: Secondary | ICD-10-CM | POA: Diagnosis not present

## 2019-06-23 DIAGNOSIS — M5489 Other dorsalgia: Secondary | ICD-10-CM | POA: Diagnosis not present

## 2019-06-23 DIAGNOSIS — J449 Chronic obstructive pulmonary disease, unspecified: Secondary | ICD-10-CM | POA: Diagnosis not present

## 2019-07-01 ENCOUNTER — Other Ambulatory Visit: Payer: Self-pay

## 2019-07-01 ENCOUNTER — Encounter (HOSPITAL_COMMUNITY): Payer: Self-pay | Admitting: Emergency Medicine

## 2019-07-01 ENCOUNTER — Emergency Department (HOSPITAL_COMMUNITY)
Admission: EM | Admit: 2019-07-01 | Discharge: 2019-07-03 | Disposition: A | Discharge status: Home / Self Care | Payer: Medicare Other | Attending: Emergency Medicine | Admitting: Emergency Medicine

## 2019-07-01 DIAGNOSIS — R45850 Homicidal ideations: Secondary | ICD-10-CM

## 2019-07-01 DIAGNOSIS — I10 Essential (primary) hypertension: Secondary | ICD-10-CM | POA: Diagnosis not present

## 2019-07-01 DIAGNOSIS — R45851 Suicidal ideations: Secondary | ICD-10-CM | POA: Insufficient documentation

## 2019-07-01 DIAGNOSIS — F209 Schizophrenia, unspecified: Secondary | ICD-10-CM | POA: Diagnosis not present

## 2019-07-01 DIAGNOSIS — F251 Schizoaffective disorder, depressive type: Secondary | ICD-10-CM | POA: Insufficient documentation

## 2019-07-01 DIAGNOSIS — J449 Chronic obstructive pulmonary disease, unspecified: Secondary | ICD-10-CM | POA: Insufficient documentation

## 2019-07-01 DIAGNOSIS — Z79899 Other long term (current) drug therapy: Secondary | ICD-10-CM | POA: Diagnosis not present

## 2019-07-01 DIAGNOSIS — Z7984 Long term (current) use of oral hypoglycemic drugs: Secondary | ICD-10-CM | POA: Insufficient documentation

## 2019-07-01 DIAGNOSIS — Z7982 Long term (current) use of aspirin: Secondary | ICD-10-CM | POA: Insufficient documentation

## 2019-07-01 DIAGNOSIS — F10239 Alcohol dependence with withdrawal, unspecified: Secondary | ICD-10-CM | POA: Diagnosis present

## 2019-07-01 DIAGNOSIS — E119 Type 2 diabetes mellitus without complications: Secondary | ICD-10-CM | POA: Diagnosis not present

## 2019-07-01 DIAGNOSIS — F17210 Nicotine dependence, cigarettes, uncomplicated: Secondary | ICD-10-CM | POA: Diagnosis not present

## 2019-07-01 LAB — COMPREHENSIVE METABOLIC PANEL
ALT: 19 U/L (ref 0–44)
AST: 16 U/L (ref 15–41)
Albumin: 4 g/dL (ref 3.5–5.0)
Alkaline Phosphatase: 72 U/L (ref 38–126)
Anion gap: 9 (ref 5–15)
BUN: 10 mg/dL (ref 6–20)
CO2: 24 mmol/L (ref 22–32)
Calcium: 9.8 mg/dL (ref 8.9–10.3)
Chloride: 100 mmol/L (ref 98–111)
Creatinine, Ser: 1.16 mg/dL (ref 0.61–1.24)
GFR calc Af Amer: >60 mL/min (ref >60)
GFR calc non Af Amer: >60 mL/min (ref >60)
Glucose, Bld: 204 mg/dL — ABNORMAL HIGH (ref 70–99)
Potassium: 4.4 mmol/L (ref 3.5–5.1)
Sodium: 133 mmol/L — ABNORMAL LOW (ref 135–145)
Total Bilirubin: 1.1 mg/dL (ref 0.3–1.2)
Total Protein: 6.8 g/dL (ref 6.5–8.1)

## 2019-07-01 LAB — CBC
HCT: 42.4 % (ref 39.0–52.0)
Hemoglobin: 14.5 g/dL (ref 13.0–17.0)
MCH: 30.6 pg (ref 26.0–34.0)
MCHC: 34.2 g/dL (ref 30.0–36.0)
MCV: 89.5 fL (ref 80.0–100.0)
Platelets: 127 10*3/uL — ABNORMAL LOW (ref 150–400)
RBC: 4.74 MIL/uL (ref 4.22–5.81)
RDW: 15.7 % — ABNORMAL HIGH (ref 11.5–15.5)
WBC: 4.3 10*3/uL (ref 4.0–10.5)
nRBC: 0 % (ref 0.0–0.2)

## 2019-07-01 LAB — SALICYLATE LEVEL: Salicylate Lvl: <7 mg/dL (ref 2.8–30.0)

## 2019-07-01 LAB — ETHANOL: Alcohol, Ethyl (B): <10 mg/dL (ref <10)

## 2019-07-01 LAB — ACETAMINOPHEN LEVEL: Acetaminophen (Tylenol), Serum: <10 ug/mL — ABNORMAL LOW (ref 10–30)

## 2019-07-01 MED ORDER — METFORMIN HCL 500 MG PO TABS
500.0000 mg | ORAL_TABLET | Freq: Three times a day (TID) | ORAL | Status: DC
  Administered 2019-07-02 – 2019-07-03 (×4): 500 mg via ORAL
  Filled 2019-07-01 (×4): qty 1

## 2019-07-01 MED ORDER — GABAPENTIN 400 MG PO CAPS
800.0000 mg | ORAL_CAPSULE | Freq: Two times a day (BID) | ORAL | Status: DC
  Administered 2019-07-01 – 2019-07-03 (×4): 800 mg via ORAL
  Filled 2019-07-01 (×4): qty 2

## 2019-07-01 MED ORDER — LORAZEPAM 1 MG PO TABS
1.0000 mg | ORAL_TABLET | Freq: Three times a day (TID) | ORAL | Status: DC
  Administered 2019-07-01 – 2019-07-03 (×5): 1 mg via ORAL
  Filled 2019-07-01 (×5): qty 1

## 2019-07-01 MED ORDER — KETOROLAC TROMETHAMINE 60 MG/2ML IM SOLN: 60.0000 mg | Freq: Once | INTRAMUSCULAR | Status: DC

## 2019-07-01 MED ORDER — ALBUTEROL SULFATE HFA 108 (90 BASE) MCG/ACT IN AERS
2.0000 | INHALATION_SPRAY | RESPIRATORY_TRACT | Status: DC | PRN
  Administered 2019-07-01 – 2019-07-03 (×2): 2 via RESPIRATORY_TRACT
  Filled 2019-07-01: qty 6.7

## 2019-07-01 MED ORDER — ACETAMINOPHEN 500 MG PO TABS
1000.0000 mg | ORAL_TABLET | Freq: Four times a day (QID) | ORAL | Status: DC | PRN
  Administered 2019-07-01 – 2019-07-03 (×4): 1000 mg via ORAL
  Filled 2019-07-01 (×4): qty 2

## 2019-07-01 MED ORDER — ZIPRASIDONE MESYLATE 20 MG IM SOLR
10.0000 mg | Freq: Once | INTRAMUSCULAR | Status: DC
  Filled 2019-07-01: qty 20
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
## 2019-07-01 NOTE — ED Notes (Signed)
PA at bedside to evaluate patient.

## 2019-07-01 NOTE — ED Notes (Signed)
Pt changed into paper scrubs, moved to room 16, wanded by security, sitter remains at bedside, pt has two bags of belongings placed in locker,

## 2019-07-01 NOTE — ED Notes (Signed)
Pt ambulatory to restroom, sitter remains at bedside,

## 2019-07-01 NOTE — ED Notes (Signed)
Lab at bedside

## 2019-07-01 NOTE — ED Notes (Signed)
Tele psych finished, pt given frozen meal tray, sitter remains at bedside,
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
## 2019-07-02 LAB — RAPID URINE DRUG SCREEN, HOSP PERFORMED
Amphetamines: NOT DETECTED
Barbiturates: NOT DETECTED
Benzodiazepines: NOT DETECTED
Cocaine: NOT DETECTED
Opiates: NOT DETECTED
Tetrahydrocannabinol: NOT DETECTED

## 2019-07-02 LAB — CBG MONITORING, ED: Glucose-Capillary: 176 mg/dL — ABNORMAL HIGH (ref 70–99)

## 2019-07-02 MED ORDER — LORATADINE 10 MG PO TABS
10.0000 mg | ORAL_TABLET | Freq: Once | ORAL | Status: AC
  Administered 2019-07-02: 11:00:00 10 mg via ORAL
  Filled 2019-07-02: qty 1

## 2019-07-02 MED ORDER — ZIPRASIDONE MESYLATE 20 MG IM SOLR
10.0000 mg | Freq: Once | INTRAMUSCULAR | Status: AC
  Administered 2019-07-02: 10 mg via INTRAMUSCULAR

## 2019-07-02 NOTE — ED Notes (Signed)
Pt now back at desk, cursing about nobody knowing what they are doing. Requesting Geodon now. Same given

## 2019-07-02 NOTE — ED Notes (Signed)
Pt ambulatory to restroom, urine sample obtained, sent to lab, sitter remains at bedside,

## 2019-07-02 NOTE — ED Notes (Signed)
Pt c/o pain to bilateral legs and lower back,

## 2019-07-02 NOTE — Care Management (Signed)
Per Lindon Romp, NP - inpatient recommended.    Patient is under review at the following facilities:  [facility], [facility], [facility], [facility], [facility], [facility], [facility], [facility], [facility], [facility], [facility], [facility], [facility], [facility], [facility], [facility], [facility],

## 2019-07-02 NOTE — ED Notes (Signed)
Pt requesting something for nasal congestion and to speak with TTS. Call to Behav. Health and pt informed he will get a TTS eval this am

## 2019-07-02 NOTE — ED Notes (Signed)
Pt refuses a repeat b/p. Explained to pt that earlier b/p was elevated but pt refuses to have b/p rechecked. Dr Rogene Houston notified of b/p result and pt refusal for recheck.

## 2019-07-02 NOTE — ED Notes (Signed)
Pt requesting Geodon because he feels really anxious, states they gave him a shot last pm and it helped. Requesting something frequently and same reported to MD. Explained to pt that he has Ativan and Neurontin ordered at 10pm but pt requesting something now. Order received and then pt refuses med because "I was getting ready to go to sleep and I decided I would wait for my meds at 10 pm"

## 2019-07-03 LAB — CBG MONITORING, ED: Glucose-Capillary: 166 mg/dL — ABNORMAL HIGH (ref 70–99)

## 2019-07-03 MED ORDER — PANTOPRAZOLE SODIUM 40 MG PO TBEC
40.0000 mg | DELAYED_RELEASE_TABLET | Freq: Every day | ORAL | Status: DC
  Administered 2019-07-03: 40 mg via ORAL
  Filled 2019-07-03: qty 1

## 2019-07-03 MED ORDER — ALBUTEROL SULFATE HFA 108 (90 BASE) MCG/ACT IN AERS: 2.0000 | INHALATION_SPRAY | Freq: Four times a day (QID) | RESPIRATORY_TRACT | Status: DC | PRN

## 2019-07-03 MED ORDER — ISOSORBIDE MONONITRATE ER 30 MG PO TB24
30.0000 mg | ORAL_TABLET | Freq: Every day | ORAL | Status: DC
  Administered 2019-07-03: 30 mg via ORAL
  Filled 2019-07-03 (×2): qty 1

## 2019-07-03 MED ORDER — ASPIRIN EC 81 MG PO TBEC
81.0000 mg | DELAYED_RELEASE_TABLET | Freq: Every day | ORAL | Status: DC
  Administered 2019-07-03: 81 mg via ORAL
  Filled 2019-07-03: qty 1

## 2019-07-03 MED ORDER — METFORMIN HCL ER 500 MG PO TB24
1000.0000 mg | ORAL_TABLET | Freq: Two times a day (BID) | ORAL | Status: DC
  Filled 2019-07-03 (×2): qty 2

## 2019-07-03 MED ORDER — BACLOFEN 10 MG PO TABS
10.0000 mg | ORAL_TABLET | Freq: Three times a day (TID) | ORAL | Status: DC
  Administered 2019-07-03: 10:00:00 10 mg via ORAL
  Filled 2019-07-03: qty 1

## 2019-07-03 MED ORDER — ATORVASTATIN CALCIUM 40 MG PO TABS: 40.0000 mg | ORAL_TABLET | Freq: Every day | ORAL | Status: DC

## 2019-07-03 MED ORDER — METOPROLOL SUCCINATE ER 25 MG PO TB24
25.0000 mg | ORAL_TABLET | Freq: Two times a day (BID) | ORAL | Status: DC
  Administered 2019-07-03: 10:00:00 25 mg via ORAL
  Filled 2019-07-03: qty 1

## 2019-07-03 MED ORDER — LISINOPRIL 10 MG PO TABS
10.0000 mg | ORAL_TABLET | Freq: Every day | ORAL | Status: DC
  Administered 2019-07-03: 10 mg via ORAL
  Filled 2019-07-03: qty 1

## 2019-07-03 MED ORDER — INSULIN DETEMIR 100 UNIT/ML ~~LOC~~ SOLN
20.0000 [IU] | Freq: Every day | SUBCUTANEOUS | Status: DC
  Filled 2019-07-03: qty 0.2

## 2019-07-03 MED ORDER — NITROGLYCERIN 0.4 MG SL SUBL: 0.4000 mg | SUBLINGUAL_TABLET | SUBLINGUAL | Status: DC | PRN

## 2019-07-03 MED ORDER — VITAMIN B-1 100 MG PO TABS
100.0000 mg | ORAL_TABLET | Freq: Every day | ORAL | Status: DC
  Administered 2019-07-03: 100 mg via ORAL
  Filled 2019-07-03: qty 1

## 2019-07-03 MED ORDER — INSULIN ASPART 100 UNIT/ML ~~LOC~~ SOLN: 1.0000 [IU] | Freq: Three times a day (TID) | SUBCUTANEOUS | Status: DC
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
## 2019-07-03 NOTE — ED Notes (Signed)
Pt in room yelling and cursing at staff saying he wants his inhaler

## 2019-07-03 NOTE — Discharge Instructions (Signed)
You may return at any time if you feel the need for repeat evaluation or if you have worsening symptoms including worsening pain / difficulty breathing or suicidal thoughts.

## 2019-07-03 NOTE — ED Notes (Signed)
Pt has voiced several times he wants to go home, he does not want to kill himself anymore.  Request to speak with [facility].  Called TTS assessment line and informed them what pt said.  tts states he is on the list to be seen. Pt notified.

## 2019-07-03 NOTE — ED Provider Notes (Signed)
The pt is cleared for d/c by psychiatry - has no c/o - has been ambulatory and pleasant and has not been dangerous to himself, not threatening others.  Wants to go home and states he has no c/o at thsi time.   Noemi Chapel, MD 07/03/19 [phone]

## 2019-07-14 ENCOUNTER — Emergency Department (HOSPITAL_COMMUNITY)
Admission: EM | Admit: 2019-07-14 | Discharge: 2019-07-16 | Disposition: A | Discharge status: Home / Self Care | Payer: Medicare Other | Attending: Emergency Medicine | Admitting: Emergency Medicine

## 2019-07-14 ENCOUNTER — Encounter (HOSPITAL_COMMUNITY): Payer: Self-pay

## 2019-07-14 ENCOUNTER — Other Ambulatory Visit: Payer: Self-pay

## 2019-07-14 DIAGNOSIS — Z794 Long term (current) use of insulin: Secondary | ICD-10-CM | POA: Diagnosis not present

## 2019-07-14 DIAGNOSIS — R45851 Suicidal ideations: Secondary | ICD-10-CM | POA: Insufficient documentation

## 2019-07-14 DIAGNOSIS — Z20828 Contact with and (suspected) exposure to other viral communicable diseases: Secondary | ICD-10-CM | POA: Diagnosis not present

## 2019-07-14 DIAGNOSIS — F17210 Nicotine dependence, cigarettes, uncomplicated: Secondary | ICD-10-CM | POA: Insufficient documentation

## 2019-07-14 DIAGNOSIS — Z7982 Long term (current) use of aspirin: Secondary | ICD-10-CM | POA: Insufficient documentation

## 2019-07-14 DIAGNOSIS — F251 Schizoaffective disorder, depressive type: Secondary | ICD-10-CM | POA: Diagnosis not present

## 2019-07-14 DIAGNOSIS — Z03818 Encounter for observation for suspected exposure to other biological agents ruled out: Secondary | ICD-10-CM | POA: Diagnosis not present

## 2019-07-14 DIAGNOSIS — J449 Chronic obstructive pulmonary disease, unspecified: Secondary | ICD-10-CM | POA: Insufficient documentation

## 2019-07-14 DIAGNOSIS — Z79899 Other long term (current) drug therapy: Secondary | ICD-10-CM | POA: Diagnosis not present

## 2019-07-14 DIAGNOSIS — E119 Type 2 diabetes mellitus without complications: Secondary | ICD-10-CM | POA: Diagnosis not present

## 2019-07-14 DIAGNOSIS — I10 Essential (primary) hypertension: Secondary | ICD-10-CM | POA: Diagnosis not present

## 2019-07-14 LAB — CBC WITH DIFFERENTIAL/PLATELET
Abs Immature Granulocytes: 0.03 10*3/uL (ref 0.00–0.07)
Basophils Absolute: 0.1 10*3/uL (ref 0.0–0.1)
Basophils Relative: 1 %
Eosinophils Absolute: 0.1 10*3/uL (ref 0.0–0.5)
Eosinophils Relative: 2 %
HCT: 46.4 % (ref 39.0–52.0)
Hemoglobin: 16.1 g/dL (ref 13.0–17.0)
Immature Granulocytes: 1 %
Lymphocytes Relative: 23 %
Lymphs Abs: 1.3 10*3/uL (ref 0.7–4.0)
MCH: 31 pg (ref 26.0–34.0)
MCHC: 34.7 g/dL (ref 30.0–36.0)
MCV: 89.2 fL (ref 80.0–100.0)
Monocytes Absolute: 0.5 10*3/uL (ref 0.1–1.0)
Monocytes Relative: 9 %
Neutro Abs: 3.6 10*3/uL (ref 1.7–7.7)
Neutrophils Relative %: 64 %
Platelets: 150 10*3/uL (ref 150–400)
RBC: 5.2 MIL/uL (ref 4.22–5.81)
RDW: 15.7 % — ABNORMAL HIGH (ref 11.5–15.5)
WBC: 5.5 10*3/uL (ref 4.0–10.5)
nRBC: 0 % (ref 0.0–0.2)

## 2019-07-14 LAB — COMPREHENSIVE METABOLIC PANEL
ALT: 17 U/L (ref 0–44)
AST: 17 U/L (ref 15–41)
Albumin: 4.5 g/dL (ref 3.5–5.0)
Alkaline Phosphatase: 80 U/L (ref 38–126)
Anion gap: 12 (ref 5–15)
BUN: 18 mg/dL (ref 6–20)
CO2: 22 mmol/L (ref 22–32)
Calcium: 9.4 mg/dL (ref 8.9–10.3)
Chloride: 97 mmol/L — ABNORMAL LOW (ref 98–111)
Creatinine, Ser: 1.25 mg/dL — ABNORMAL HIGH (ref 0.61–1.24)
GFR calc Af Amer: >60 mL/min (ref >60)
GFR calc non Af Amer: >60 mL/min (ref >60)
Glucose, Bld: 197 mg/dL — ABNORMAL HIGH (ref 70–99)
Potassium: 4.2 mmol/L (ref 3.5–5.1)
Sodium: 131 mmol/L — ABNORMAL LOW (ref 135–145)
Total Bilirubin: 1.1 mg/dL (ref 0.3–1.2)
Total Protein: 7.6 g/dL (ref 6.5–8.1)

## 2019-07-14 LAB — RAPID URINE DRUG SCREEN, HOSP PERFORMED
Amphetamines: NOT DETECTED
Barbiturates: NOT DETECTED
Benzodiazepines: NOT DETECTED
Cocaine: NOT DETECTED
Opiates: NOT DETECTED
Tetrahydrocannabinol: NOT DETECTED

## 2019-07-14 LAB — ETHANOL: Alcohol, Ethyl (B): <10 mg/dL (ref <10)

## 2019-07-14 MED ORDER — MOMETASONE FURO-FORMOTEROL FUM 100-5 MCG/ACT IN AERO
2.0000 | INHALATION_SPRAY | Freq: Two times a day (BID) | RESPIRATORY_TRACT | Status: DC
  Administered 2019-07-14 – 2019-07-15 (×2): 2 via RESPIRATORY_TRACT
  Filled 2019-07-14 (×2): qty 8.8

## 2019-07-14 MED ORDER — OXYCODONE-ACETAMINOPHEN 5-325 MG PO TABS
1.0000 | ORAL_TABLET | Freq: Once | ORAL | Status: AC
  Administered 2019-07-14: 1 via ORAL
  Filled 2019-07-14: qty 1

## 2019-07-14 MED ORDER — INSULIN DETEMIR 100 UNIT/ML ~~LOC~~ SOLN
20.0000 [IU] | Freq: Every day | SUBCUTANEOUS | Status: DC
  Filled 2019-07-14 (×3): qty 0.2

## 2019-07-14 MED ORDER — STERILE WATER FOR INJECTION IJ SOLN
INTRAMUSCULAR | Status: AC
  Administered 2019-07-14: 1.2 mL
  Filled 2019-07-14: qty 10

## 2019-07-14 MED ORDER — ASPIRIN 81 MG PO TBEC
81.0000 mg | DELAYED_RELEASE_TABLET | Freq: Every day | ORAL | Status: DC
  Administered 2019-07-15: 81 mg via ORAL
  Filled 2019-07-14 (×4): qty 1

## 2019-07-14 MED ORDER — ZIPRASIDONE MESYLATE 20 MG IM SOLR
10.0000 mg | Freq: Once | INTRAMUSCULAR | Status: AC
  Administered 2019-07-14: 10 mg via INTRAMUSCULAR
  Filled 2019-07-14: qty 20

## 2019-07-14 MED ORDER — PANTOPRAZOLE SODIUM 40 MG PO TBEC
40.0000 mg | DELAYED_RELEASE_TABLET | Freq: Every day | ORAL | Status: DC
  Administered 2019-07-15 (×2): 40 mg via ORAL
  Filled 2019-07-14: qty 1

## 2019-07-14 MED ORDER — OXYCODONE-ACETAMINOPHEN 5-325 MG PO TABS: 1.0000 | ORAL_TABLET | Freq: Once | ORAL | Status: DC

## 2019-07-14 MED ORDER — CLONAZEPAM 0.5 MG PO TABS
2.0000 mg | ORAL_TABLET | Freq: Once | ORAL | Status: AC
  Administered 2019-07-14: 2 mg via ORAL
  Filled 2019-07-14: qty 4

## 2019-07-14 MED ORDER — ZOLPIDEM TARTRATE 5 MG PO TABS
5.0000 mg | ORAL_TABLET | Freq: Once | ORAL | Status: AC
  Administered 2019-07-14: 23:00:00 5 mg via ORAL
  Filled 2019-07-14: qty 1

## 2019-07-14 MED ORDER — INSULIN ASPART 100 UNIT/ML ~~LOC~~ SOLN: 1.0000 [IU] | Freq: Three times a day (TID) | SUBCUTANEOUS | Status: DC

## 2019-07-14 MED ORDER — METOPROLOL SUCCINATE ER 25 MG PO TB24
25.0000 mg | ORAL_TABLET | Freq: Two times a day (BID) | ORAL | Status: DC
  Administered 2019-07-14 – 2019-07-15 (×3): 25 mg via ORAL
  Filled 2019-07-14 (×3): qty 1

## 2019-07-14 MED ORDER — METFORMIN HCL ER 500 MG PO TB24
1000.0000 mg | ORAL_TABLET | Freq: Two times a day (BID) | ORAL | Status: DC
  Administered 2019-07-15: 1000 mg via ORAL
  Filled 2019-07-14 (×7): qty 2

## 2019-07-14 MED ORDER — ISOSORBIDE MONONITRATE ER 30 MG PO TB24
30.0000 mg | ORAL_TABLET | Freq: Every day | ORAL | Status: DC
  Administered 2019-07-15: 10:00:00 30 mg via ORAL
  Filled 2019-07-14 (×4): qty 1

## 2019-07-14 MED ORDER — ZIPRASIDONE MESYLATE 20 MG IM SOLR: 20.0000 mg | Freq: Once | INTRAMUSCULAR | Status: DC

## 2019-07-14 MED ORDER — LISINOPRIL 10 MG PO TABS
10.0000 mg | ORAL_TABLET | Freq: Every day | ORAL | Status: DC
  Administered 2019-07-15 (×2): 10 mg via ORAL
  Filled 2019-07-14: qty 1

## 2019-07-14 NOTE — ED Notes (Signed)
Pt is paranoid, loudly verbal and demanding of coffee or caffeine
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
## 2019-07-14 NOTE — ED Notes (Signed)
Snacks provided.

## 2019-07-14 NOTE — ED Notes (Signed)
Pt is not under IVC  Here for psychiatric evaluation
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
## 2019-07-14 NOTE — ED Notes (Signed)
Pt informed Richard, security he wants to go to hospice

## 2019-07-14 NOTE — ED Notes (Signed)
Pt encouraged to speak softly  He was brought from [place] and states "I want to go to jail"  He is moved to room 17 for diminished stimuli

## 2019-07-14 NOTE — ED Notes (Signed)
Pt out in hall insisting on pain meds for his chronic back pain   He is encouraged to return to room and Dr Roderic Palau informed   Orders initially for po meds then [place] and geodon ordered due to pt disruption
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
## 2019-07-14 NOTE — ED Notes (Signed)
Pt has belongings from previous visits in belonging room on the metal shelf as well as in locker

## 2019-07-14 NOTE — ED Notes (Addendum)
Pt request more klonopin, something else for pain, something to assist sleep  Pt informed that physician will be asked for sleep aid   Pt encourage to try to rest, speak softly and allow others restful sleep   He request more snacks- has enjoyed peanut butter and crackers x 3 each and is encouraged again to try to rest- snack request is politely declined   After speaking with Ginger, RN, CN decision made to obtain Covid test in am should pt be accepted for admission

## 2019-07-14 NOTE — ED Notes (Signed)
Pt given crackers.  
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
## 2019-07-15 DIAGNOSIS — F251 Schizoaffective disorder, depressive type: Secondary | ICD-10-CM | POA: Diagnosis not present

## 2019-07-15 LAB — CBG MONITORING, ED
Glucose-Capillary: 323 mg/dL — ABNORMAL HIGH (ref 70–99)
Glucose-Capillary: 223 mg/dL — ABNORMAL HIGH (ref 70–99)
Glucose-Capillary: 135 mg/dL — ABNORMAL HIGH (ref 70–99)

## 2019-07-15 LAB — SARS CORONAVIRUS 2 BY RT PCR (HOSPITAL ORDER, PERFORMED IN ~~LOC~~ HOSPITAL LAB): SARS Coronavirus 2: NEGATIVE

## 2019-07-15 MED ORDER — OXYCODONE-ACETAMINOPHEN 5-325 MG PO TABS
1.0000 | ORAL_TABLET | Freq: Once | ORAL | Status: AC
  Administered 2019-07-15: 21:00:00 1 via ORAL
  Filled 2019-07-15: qty 1

## 2019-07-15 MED ORDER — ASPIRIN 81 MG PO CHEW
CHEWABLE_TABLET | ORAL | Status: AC
  Filled 2019-07-15: qty 1

## 2019-07-15 MED ORDER — ZIPRASIDONE MESYLATE 20 MG IM SOLR: 20.0000 mg | Freq: Once | INTRAMUSCULAR | Status: DC

## 2019-07-15 MED ORDER — STERILE WATER FOR INJECTION IJ SOLN
INTRAMUSCULAR | Status: AC
  Administered 2019-07-15: 10 mL
  Filled 2019-07-15: qty 10

## 2019-07-15 MED ORDER — INSULIN ASPART 100 UNIT/ML ~~LOC~~ SOLN
0.0000 [IU] | Freq: Three times a day (TID) | SUBCUTANEOUS | Status: DC
  Administered 2019-07-15: 5 [IU] via SUBCUTANEOUS
  Administered 2019-07-15: 10:00:00 11 [IU] via SUBCUTANEOUS
  Filled 2019-07-15 (×2): qty 1

## 2019-07-15 MED ORDER — INSULIN ASPART 100 UNIT/ML ~~LOC~~ SOLN: 0.0000 [IU] | Freq: Three times a day (TID) | SUBCUTANEOUS | Status: DC

## 2019-07-15 MED ORDER — INSULIN ASPART 100 UNIT/ML ~~LOC~~ SOLN: 0.0000 [IU] | Freq: Every day | SUBCUTANEOUS | Status: DC

## 2019-07-15 MED ORDER — ZIPRASIDONE MESYLATE 20 MG IM SOLR
10.0000 mg | Freq: Once | INTRAMUSCULAR | Status: AC
  Administered 2019-07-15: 10 mg via INTRAMUSCULAR
  Filled 2019-07-15: qty 20

## 2019-07-15 MED ORDER — NICOTINE 21 MG/24HR TD PT24
21.0000 mg | MEDICATED_PATCH | Freq: Every day | TRANSDERMAL | Status: DC
  Administered 2019-07-15: 21 mg via TRANSDERMAL
  Filled 2019-07-15: qty 1

## 2019-07-15 MED ORDER — ONDANSETRON 4 MG PO TBDP
4.0000 mg | ORAL_TABLET | Freq: Once | ORAL | Status: AC
  Administered 2019-07-15: 4 mg via ORAL
  Filled 2019-07-15: qty 1

## 2019-07-15 NOTE — Progress Notes (Signed)
Patient meets criteria for inpatient treatment. No appropriate or available beds at [facility]. CSW faxed referrals to the following facilities for review:  [facility]  [facility]  [facility]   TTS will continue to seek bed placement.  Chalmers Guest. Guerry Bruin, MSW, [place] Work/Disposition Phone: [phone] Fax: [phone]

## 2019-07-15 NOTE — BHH Counselor (Signed)
  REASSESSMENT  Patient reevaluated for safety and stability.  Pt was observed laying in the bed, alert, oriented x2.  Pt reports ongoing suicidal ideations.  Pt states, "I don't feel like killing myself right now, but I will feel like it before the day is over.  I got bad anxiety and they won't give me nothing.  I want to go to a mental hospital to get some help."  Pt denies HI/A/V-hallucinations.  Pt continues to meet inpatient criteria due to ongoing suicidal ideation.  TTS will continue to look for inpatient placement.  Quintin Hjort L. [place], [place], [facility], [facility] Therapeutic Triage Specialist  [phone]

## 2019-07-16 DIAGNOSIS — F251 Schizoaffective disorder, depressive type: Secondary | ICD-10-CM | POA: Diagnosis not present

## 2019-07-16 LAB — CBG MONITORING, ED: Glucose-Capillary: 250 mg/dL — ABNORMAL HIGH (ref 70–99)

## 2019-07-16 MED ORDER — ZOLPIDEM TARTRATE 5 MG PO TABS
10.0000 mg | ORAL_TABLET | Freq: Once | ORAL | Status: AC
  Administered 2019-07-16: 10 mg via ORAL
  Filled 2019-07-16: qty 2

## 2019-07-16 MED ORDER — ONDANSETRON 8 MG PO TBDP
8.0000 mg | ORAL_TABLET | Freq: Once | ORAL | Status: AC
  Administered 2019-07-16: 8 mg via ORAL
  Filled 2019-07-16: qty 1

## 2019-07-16 MED ORDER — IBUPROFEN 400 MG PO TABS
600.0000 mg | ORAL_TABLET | Freq: Once | ORAL | Status: AC
  Administered 2019-07-16: 09:00:00 600 mg via ORAL
  Filled 2019-07-16: qty 2

## 2019-07-16 NOTE — ED Notes (Signed)
Patient belongings returned to pt.

## 2019-07-16 NOTE — ED Notes (Signed)
Pt refused meal tray °

## 2019-07-16 NOTE — BHH Counselor (Signed)
Received phone call from Aaron Edelman, RN that patient is requesting discharge. Per Ricky Ala, NP patient is psych cleared.

## 2019-07-16 NOTE — ED Notes (Signed)
TTS called and states patient is cleared for discharge home. Waiting for TTS note for dispo change.

## 2019-07-16 NOTE — ED Notes (Signed)
Patient states that he wants to leave to go home. He states that he wants to talk TTS so that he can go home.

## 2019-07-16 NOTE — ED Provider Notes (Signed)
Has been seen by TTS who is very familiar with him.  Patient is no longer suicidal and TTS feels as though he is appropriate for discharge.  Patient is requesting to leave.  He will be discharged at his request.   Veryl Speak, MD 07/16/19 [phone]

## 2019-07-16 NOTE — Discharge Instructions (Addendum)
Follow-up with your outpatient counselor.

## 2019-07-18 DIAGNOSIS — E162 Hypoglycemia, unspecified: Secondary | ICD-10-CM | POA: Diagnosis not present

## 2019-07-18 DIAGNOSIS — E161 Other hypoglycemia: Secondary | ICD-10-CM | POA: Diagnosis not present

## 2019-07-23 DIAGNOSIS — E1165 Type 2 diabetes mellitus with hyperglycemia: Secondary | ICD-10-CM | POA: Diagnosis not present

## 2019-07-23 DIAGNOSIS — R079 Chest pain, unspecified: Secondary | ICD-10-CM | POA: Diagnosis not present

## 2019-07-23 DIAGNOSIS — R1013 Epigastric pain: Secondary | ICD-10-CM | POA: Diagnosis not present

## 2019-07-23 DIAGNOSIS — I10 Essential (primary) hypertension: Secondary | ICD-10-CM | POA: Diagnosis not present

## 2019-07-24 DIAGNOSIS — Z9109 Other allergy status, other than to drugs and biological substances: Secondary | ICD-10-CM | POA: Diagnosis not present

## 2019-07-24 DIAGNOSIS — I10 Essential (primary) hypertension: Secondary | ICD-10-CM | POA: Diagnosis not present

## 2019-07-24 DIAGNOSIS — F17200 Nicotine dependence, unspecified, uncomplicated: Secondary | ICD-10-CM | POA: Diagnosis not present

## 2019-07-24 DIAGNOSIS — M79602 Pain in left arm: Secondary | ICD-10-CM | POA: Diagnosis not present

## 2019-07-24 DIAGNOSIS — Z794 Long term (current) use of insulin: Secondary | ICD-10-CM | POA: Diagnosis not present

## 2019-07-24 DIAGNOSIS — M25552 Pain in left hip: Secondary | ICD-10-CM | POA: Diagnosis not present

## 2019-07-24 DIAGNOSIS — Z79899 Other long term (current) drug therapy: Secondary | ICD-10-CM | POA: Diagnosis not present

## 2019-07-24 DIAGNOSIS — R45851 Suicidal ideations: Secondary | ICD-10-CM | POA: Diagnosis not present

## 2019-07-24 DIAGNOSIS — G8929 Other chronic pain: Secondary | ICD-10-CM | POA: Diagnosis not present

## 2019-07-24 DIAGNOSIS — F259 Schizoaffective disorder, unspecified: Secondary | ICD-10-CM | POA: Diagnosis not present

## 2019-07-24 DIAGNOSIS — M79605 Pain in left leg: Secondary | ICD-10-CM | POA: Diagnosis not present

## 2019-07-24 DIAGNOSIS — M545 Low back pain: Secondary | ICD-10-CM | POA: Diagnosis not present

## 2019-07-24 DIAGNOSIS — F29 Unspecified psychosis not due to a substance or known physiological condition: Secondary | ICD-10-CM | POA: Diagnosis not present

## 2019-07-24 DIAGNOSIS — E119 Type 2 diabetes mellitus without complications: Secondary | ICD-10-CM | POA: Diagnosis not present

## 2019-07-24 DIAGNOSIS — J449 Chronic obstructive pulmonary disease, unspecified: Secondary | ICD-10-CM | POA: Diagnosis not present

## 2019-07-24 DIAGNOSIS — M79601 Pain in right arm: Secondary | ICD-10-CM | POA: Diagnosis not present

## 2019-07-30 DIAGNOSIS — R457 State of emotional shock and stress, unspecified: Secondary | ICD-10-CM | POA: Diagnosis not present

## 2019-07-30 DIAGNOSIS — E161 Other hypoglycemia: Secondary | ICD-10-CM | POA: Diagnosis not present

## 2019-07-30 DIAGNOSIS — E162 Hypoglycemia, unspecified: Secondary | ICD-10-CM | POA: Diagnosis not present

## 2019-07-31 DIAGNOSIS — J449 Chronic obstructive pulmonary disease, unspecified: Secondary | ICD-10-CM | POA: Diagnosis not present

## 2019-07-31 DIAGNOSIS — R45850 Homicidal ideations: Secondary | ICD-10-CM | POA: Diagnosis not present

## 2019-07-31 DIAGNOSIS — E119 Type 2 diabetes mellitus without complications: Secondary | ICD-10-CM | POA: Diagnosis not present

## 2019-07-31 DIAGNOSIS — R45851 Suicidal ideations: Secondary | ICD-10-CM | POA: Diagnosis not present

## 2019-07-31 DIAGNOSIS — F419 Anxiety disorder, unspecified: Secondary | ICD-10-CM | POA: Diagnosis not present

## 2019-07-31 DIAGNOSIS — F17200 Nicotine dependence, unspecified, uncomplicated: Secondary | ICD-10-CM | POA: Diagnosis not present

## 2019-07-31 DIAGNOSIS — I10 Essential (primary) hypertension: Secondary | ICD-10-CM | POA: Diagnosis not present

## 2019-08-09 DIAGNOSIS — R069 Unspecified abnormalities of breathing: Secondary | ICD-10-CM | POA: Diagnosis not present

## 2019-08-22 DIAGNOSIS — E119 Type 2 diabetes mellitus without complications: Secondary | ICD-10-CM | POA: Diagnosis not present

## 2019-08-22 DIAGNOSIS — F17200 Nicotine dependence, unspecified, uncomplicated: Secondary | ICD-10-CM | POA: Diagnosis not present

## 2019-08-22 DIAGNOSIS — F329 Major depressive disorder, single episode, unspecified: Secondary | ICD-10-CM | POA: Diagnosis not present

## 2019-08-22 DIAGNOSIS — Z79899 Other long term (current) drug therapy: Secondary | ICD-10-CM | POA: Diagnosis not present

## 2019-08-22 DIAGNOSIS — I10 Essential (primary) hypertension: Secondary | ICD-10-CM | POA: Diagnosis not present

## 2019-08-22 DIAGNOSIS — Z720 Tobacco use: Secondary | ICD-10-CM | POA: Diagnosis not present

## 2019-08-22 DIAGNOSIS — R000 Tachycardia, unspecified: Secondary | ICD-10-CM | POA: Diagnosis not present

## 2019-08-22 DIAGNOSIS — E1165 Type 2 diabetes mellitus with hyperglycemia: Secondary | ICD-10-CM | POA: Diagnosis not present

## 2019-08-22 DIAGNOSIS — R0902 Hypoxemia: Secondary | ICD-10-CM | POA: Diagnosis not present

## 2019-08-22 DIAGNOSIS — J449 Chronic obstructive pulmonary disease, unspecified: Secondary | ICD-10-CM | POA: Diagnosis not present

## 2019-08-22 DIAGNOSIS — Z888 Allergy status to other drugs, medicaments and biological substances status: Secondary | ICD-10-CM | POA: Diagnosis not present

## 2019-08-22 DIAGNOSIS — R0689 Other abnormalities of breathing: Secondary | ICD-10-CM | POA: Diagnosis not present

## 2019-08-22 DIAGNOSIS — F419 Anxiety disorder, unspecified: Secondary | ICD-10-CM | POA: Diagnosis not present

## 2019-08-22 DIAGNOSIS — Z794 Long term (current) use of insulin: Secondary | ICD-10-CM | POA: Diagnosis not present

## 2019-08-23 DIAGNOSIS — F419 Anxiety disorder, unspecified: Secondary | ICD-10-CM | POA: Diagnosis not present

## 2019-08-23 DIAGNOSIS — E1165 Type 2 diabetes mellitus with hyperglycemia: Secondary | ICD-10-CM | POA: Diagnosis not present

## 2019-08-23 DIAGNOSIS — I10 Essential (primary) hypertension: Secondary | ICD-10-CM | POA: Diagnosis not present

## 2019-08-23 DIAGNOSIS — F329 Major depressive disorder, single episode, unspecified: Secondary | ICD-10-CM | POA: Diagnosis not present

## 2019-08-23 DIAGNOSIS — Z794 Long term (current) use of insulin: Secondary | ICD-10-CM | POA: Diagnosis not present

## 2019-08-23 DIAGNOSIS — Z888 Allergy status to other drugs, medicaments and biological substances status: Secondary | ICD-10-CM | POA: Diagnosis not present

## 2019-08-23 DIAGNOSIS — Z79899 Other long term (current) drug therapy: Secondary | ICD-10-CM | POA: Diagnosis not present

## 2019-08-23 DIAGNOSIS — Z720 Tobacco use: Secondary | ICD-10-CM | POA: Diagnosis not present

## 2019-08-23 DIAGNOSIS — R52 Pain, unspecified: Secondary | ICD-10-CM | POA: Diagnosis not present

## 2019-08-23 DIAGNOSIS — J449 Chronic obstructive pulmonary disease, unspecified: Secondary | ICD-10-CM | POA: Diagnosis not present

## 2019-08-23 DIAGNOSIS — Z7982 Long term (current) use of aspirin: Secondary | ICD-10-CM | POA: Diagnosis not present

## 2019-08-23 DIAGNOSIS — F17200 Nicotine dependence, unspecified, uncomplicated: Secondary | ICD-10-CM | POA: Diagnosis not present

## 2019-08-23 DIAGNOSIS — E119 Type 2 diabetes mellitus without complications: Secondary | ICD-10-CM | POA: Diagnosis not present

## 2019-08-23 DIAGNOSIS — Z9119 Patient's noncompliance with other medical treatment and regimen: Secondary | ICD-10-CM | POA: Diagnosis not present

## 2019-08-29 DIAGNOSIS — R079 Chest pain, unspecified: Secondary | ICD-10-CM | POA: Diagnosis not present

## 2019-08-29 DIAGNOSIS — R440 Auditory hallucinations: Secondary | ICD-10-CM | POA: Diagnosis not present

## 2019-08-29 DIAGNOSIS — R45851 Suicidal ideations: Secondary | ICD-10-CM | POA: Diagnosis not present

## 2019-08-29 DIAGNOSIS — J449 Chronic obstructive pulmonary disease, unspecified: Secondary | ICD-10-CM | POA: Diagnosis not present

## 2019-08-29 DIAGNOSIS — I10 Essential (primary) hypertension: Secondary | ICD-10-CM | POA: Diagnosis not present

## 2019-08-29 DIAGNOSIS — Z20828 Contact with and (suspected) exposure to other viral communicable diseases: Secondary | ICD-10-CM | POA: Diagnosis not present

## 2019-08-29 DIAGNOSIS — F17200 Nicotine dependence, unspecified, uncomplicated: Secondary | ICD-10-CM | POA: Diagnosis not present

## 2019-08-29 DIAGNOSIS — Z888 Allergy status to other drugs, medicaments and biological substances status: Secondary | ICD-10-CM | POA: Diagnosis not present

## 2019-08-29 DIAGNOSIS — E119 Type 2 diabetes mellitus without complications: Secondary | ICD-10-CM | POA: Diagnosis not present

## 2019-08-30 DIAGNOSIS — J449 Chronic obstructive pulmonary disease, unspecified: Secondary | ICD-10-CM | POA: Diagnosis present

## 2019-08-30 DIAGNOSIS — Z885 Allergy status to narcotic agent status: Secondary | ICD-10-CM | POA: Diagnosis not present

## 2019-08-30 DIAGNOSIS — F200 Paranoid schizophrenia: Secondary | ICD-10-CM | POA: Diagnosis present

## 2019-08-30 DIAGNOSIS — G4700 Insomnia, unspecified: Secondary | ICD-10-CM | POA: Diagnosis present

## 2019-08-30 DIAGNOSIS — I252 Old myocardial infarction: Secondary | ICD-10-CM | POA: Diagnosis not present

## 2019-08-30 DIAGNOSIS — F17200 Nicotine dependence, unspecified, uncomplicated: Secondary | ICD-10-CM | POA: Diagnosis present

## 2019-08-30 DIAGNOSIS — I10 Essential (primary) hypertension: Secondary | ICD-10-CM | POA: Diagnosis present

## 2019-08-30 DIAGNOSIS — M545 Low back pain: Secondary | ICD-10-CM | POA: Diagnosis not present

## 2019-08-30 DIAGNOSIS — Z7982 Long term (current) use of aspirin: Secondary | ICD-10-CM | POA: Diagnosis not present

## 2019-08-30 DIAGNOSIS — Z888 Allergy status to other drugs, medicaments and biological substances status: Secondary | ICD-10-CM | POA: Diagnosis not present

## 2019-08-30 DIAGNOSIS — E119 Type 2 diabetes mellitus without complications: Secondary | ICD-10-CM | POA: Diagnosis not present

## 2019-08-30 DIAGNOSIS — E1159 Type 2 diabetes mellitus with other circulatory complications: Secondary | ICD-10-CM | POA: Diagnosis present

## 2019-08-30 DIAGNOSIS — F29 Unspecified psychosis not due to a substance or known physiological condition: Secondary | ICD-10-CM | POA: Diagnosis not present

## 2019-08-30 DIAGNOSIS — R45851 Suicidal ideations: Secondary | ICD-10-CM | POA: Diagnosis present

## 2019-08-30 DIAGNOSIS — M62830 Muscle spasm of back: Secondary | ICD-10-CM | POA: Diagnosis present

## 2019-08-30 DIAGNOSIS — G8929 Other chronic pain: Secondary | ICD-10-CM | POA: Diagnosis present

## 2019-08-30 DIAGNOSIS — S3992XA Unspecified injury of lower back, initial encounter: Secondary | ICD-10-CM | POA: Diagnosis not present

## 2019-08-30 DIAGNOSIS — R440 Auditory hallucinations: Secondary | ICD-10-CM | POA: Diagnosis not present

## 2019-08-30 DIAGNOSIS — M1990 Unspecified osteoarthritis, unspecified site: Secondary | ICD-10-CM | POA: Diagnosis present

## 2019-08-30 DIAGNOSIS — Z794 Long term (current) use of insulin: Secondary | ICD-10-CM | POA: Diagnosis not present

## 2019-08-30 DIAGNOSIS — I2510 Atherosclerotic heart disease of native coronary artery without angina pectoris: Secondary | ICD-10-CM | POA: Diagnosis present

## 2019-08-30 DIAGNOSIS — Z79899 Other long term (current) drug therapy: Secondary | ICD-10-CM | POA: Diagnosis not present

## 2019-09-08 DIAGNOSIS — Z590 Homelessness: Secondary | ICD-10-CM | POA: Diagnosis not present

## 2019-09-08 DIAGNOSIS — R410 Disorientation, unspecified: Secondary | ICD-10-CM | POA: Diagnosis not present

## 2019-09-08 DIAGNOSIS — R404 Transient alteration of awareness: Secondary | ICD-10-CM | POA: Diagnosis not present

## 2019-09-08 DIAGNOSIS — F329 Major depressive disorder, single episode, unspecified: Secondary | ICD-10-CM | POA: Diagnosis not present

## 2019-09-08 DIAGNOSIS — R443 Hallucinations, unspecified: Secondary | ICD-10-CM | POA: Diagnosis not present

## 2019-09-08 DIAGNOSIS — T699XXA Effect of reduced temperature, unspecified, initial encounter: Secondary | ICD-10-CM | POA: Diagnosis not present

## 2019-09-08 DIAGNOSIS — E860 Dehydration: Secondary | ICD-10-CM | POA: Diagnosis not present

## 2019-09-08 DIAGNOSIS — Z888 Allergy status to other drugs, medicaments and biological substances status: Secondary | ICD-10-CM | POA: Diagnosis not present

## 2019-09-08 DIAGNOSIS — F17210 Nicotine dependence, cigarettes, uncomplicated: Secondary | ICD-10-CM | POA: Diagnosis not present

## 2019-09-08 DIAGNOSIS — E119 Type 2 diabetes mellitus without complications: Secondary | ICD-10-CM | POA: Diagnosis not present

## 2019-09-08 DIAGNOSIS — Z79899 Other long term (current) drug therapy: Secondary | ICD-10-CM | POA: Diagnosis not present

## 2019-09-08 DIAGNOSIS — J449 Chronic obstructive pulmonary disease, unspecified: Secondary | ICD-10-CM | POA: Diagnosis not present

## 2019-09-08 DIAGNOSIS — Z7982 Long term (current) use of aspirin: Secondary | ICD-10-CM | POA: Diagnosis not present

## 2019-09-08 DIAGNOSIS — I10 Essential (primary) hypertension: Secondary | ICD-10-CM | POA: Diagnosis not present

## 2019-09-08 DIAGNOSIS — Z794 Long term (current) use of insulin: Secondary | ICD-10-CM | POA: Diagnosis not present

## 2019-09-08 DIAGNOSIS — R442 Other hallucinations: Secondary | ICD-10-CM | POA: Diagnosis not present

## 2019-09-08 DIAGNOSIS — I959 Hypotension, unspecified: Secondary | ICD-10-CM | POA: Diagnosis not present

## 2019-09-08 DIAGNOSIS — G8929 Other chronic pain: Secondary | ICD-10-CM | POA: Diagnosis not present

## 2019-09-08 DIAGNOSIS — F419 Anxiety disorder, unspecified: Secondary | ICD-10-CM | POA: Diagnosis not present

## 2019-09-08 DIAGNOSIS — R000 Tachycardia, unspecified: Secondary | ICD-10-CM | POA: Diagnosis not present

## 2019-09-08 DIAGNOSIS — M545 Low back pain: Secondary | ICD-10-CM | POA: Diagnosis not present

## 2019-09-08 DIAGNOSIS — T68XXXA Hypothermia, initial encounter: Secondary | ICD-10-CM | POA: Diagnosis not present

## 2019-09-08 DIAGNOSIS — R4182 Altered mental status, unspecified: Secondary | ICD-10-CM | POA: Diagnosis not present

## 2019-09-12 ENCOUNTER — Inpatient Hospital Stay (HOSPITAL_COMMUNITY): Payer: Medicare Other

## 2019-09-12 ENCOUNTER — Emergency Department (HOSPITAL_COMMUNITY): Payer: Medicare Other

## 2019-09-12 ENCOUNTER — Inpatient Hospital Stay (HOSPITAL_COMMUNITY)
Admission: EM | Admit: 2019-09-12 | Discharge: 2019-09-28 | DRG: 682 | Disposition: A | Discharge status: Hospice | Payer: Medicare Other | Attending: Internal Medicine | Admitting: Internal Medicine

## 2019-09-12 ENCOUNTER — Encounter (HOSPITAL_COMMUNITY): Payer: Self-pay

## 2019-09-12 DIAGNOSIS — J440 Chronic obstructive pulmonary disease with acute lower respiratory infection: Secondary | ICD-10-CM | POA: Diagnosis not present

## 2019-09-12 DIAGNOSIS — E871 Hypo-osmolality and hyponatremia: Secondary | ICD-10-CM | POA: Diagnosis not present

## 2019-09-12 DIAGNOSIS — G9341 Metabolic encephalopathy: Secondary | ICD-10-CM | POA: Diagnosis not present

## 2019-09-12 DIAGNOSIS — I63529 Cerebral infarction due to unspecified occlusion or stenosis of unspecified anterior cerebral artery: Secondary | ICD-10-CM | POA: Diagnosis not present

## 2019-09-12 DIAGNOSIS — Z515 Encounter for palliative care: Secondary | ICD-10-CM | POA: Diagnosis not present

## 2019-09-12 DIAGNOSIS — M549 Dorsalgia, unspecified: Secondary | ICD-10-CM | POA: Diagnosis present

## 2019-09-12 DIAGNOSIS — G928 Other toxic encephalopathy: Secondary | ICD-10-CM

## 2019-09-12 DIAGNOSIS — R4020 Unspecified coma: Secondary | ICD-10-CM | POA: Diagnosis not present

## 2019-09-12 DIAGNOSIS — F200 Paranoid schizophrenia: Secondary | ICD-10-CM | POA: Diagnosis not present

## 2019-09-12 DIAGNOSIS — I63412 Cerebral infarction due to embolism of left middle cerebral artery: Secondary | ICD-10-CM | POA: Diagnosis present

## 2019-09-12 DIAGNOSIS — J181 Lobar pneumonia, unspecified organism: Secondary | ICD-10-CM | POA: Diagnosis not present

## 2019-09-12 DIAGNOSIS — E1149 Type 2 diabetes mellitus with other diabetic neurological complication: Secondary | ICD-10-CM | POA: Diagnosis not present

## 2019-09-12 DIAGNOSIS — R0600 Dyspnea, unspecified: Secondary | ICD-10-CM | POA: Diagnosis not present

## 2019-09-12 DIAGNOSIS — F1110 Opioid abuse, uncomplicated: Secondary | ICD-10-CM | POA: Diagnosis present

## 2019-09-12 DIAGNOSIS — Z23 Encounter for immunization: Secondary | ICD-10-CM

## 2019-09-12 DIAGNOSIS — Z20828 Contact with and (suspected) exposure to other viral communicable diseases: Secondary | ICD-10-CM | POA: Diagnosis present

## 2019-09-12 DIAGNOSIS — F1410 Cocaine abuse, uncomplicated: Secondary | ICD-10-CM | POA: Diagnosis present

## 2019-09-12 DIAGNOSIS — F17210 Nicotine dependence, cigarettes, uncomplicated: Secondary | ICD-10-CM | POA: Diagnosis present

## 2019-09-12 DIAGNOSIS — I6521 Occlusion and stenosis of right carotid artery: Secondary | ICD-10-CM | POA: Diagnosis not present

## 2019-09-12 DIAGNOSIS — I151 Hypertension secondary to other renal disorders: Secondary | ICD-10-CM | POA: Diagnosis not present

## 2019-09-12 DIAGNOSIS — I6389 Other cerebral infarction: Secondary | ICD-10-CM | POA: Diagnosis not present

## 2019-09-12 DIAGNOSIS — Z978 Presence of other specified devices: Secondary | ICD-10-CM

## 2019-09-12 DIAGNOSIS — Z66 Do not resuscitate: Secondary | ICD-10-CM | POA: Diagnosis not present

## 2019-09-12 DIAGNOSIS — D649 Anemia, unspecified: Secondary | ICD-10-CM | POA: Diagnosis present

## 2019-09-12 DIAGNOSIS — E870 Hyperosmolality and hypernatremia: Secondary | ICD-10-CM | POA: Diagnosis present

## 2019-09-12 DIAGNOSIS — R69 Illness, unspecified: Secondary | ICD-10-CM | POA: Diagnosis not present

## 2019-09-12 DIAGNOSIS — E872 Acidosis: Secondary | ICD-10-CM | POA: Diagnosis not present

## 2019-09-12 DIAGNOSIS — N179 Acute kidney failure, unspecified: Secondary | ICD-10-CM | POA: Diagnosis not present

## 2019-09-12 DIAGNOSIS — E860 Dehydration: Secondary | ICD-10-CM | POA: Diagnosis not present

## 2019-09-12 DIAGNOSIS — G9340 Encephalopathy, unspecified: Secondary | ICD-10-CM | POA: Diagnosis not present

## 2019-09-12 DIAGNOSIS — R7401 Elevation of levels of liver transaminase levels: Secondary | ICD-10-CM | POA: Diagnosis not present

## 2019-09-12 DIAGNOSIS — R0682 Tachypnea, not elsewhere classified: Secondary | ICD-10-CM | POA: Diagnosis not present

## 2019-09-12 DIAGNOSIS — Z833 Family history of diabetes mellitus: Secondary | ICD-10-CM

## 2019-09-12 DIAGNOSIS — R4701 Aphasia: Secondary | ICD-10-CM | POA: Diagnosis present

## 2019-09-12 DIAGNOSIS — F419 Anxiety disorder, unspecified: Secondary | ICD-10-CM | POA: Diagnosis present

## 2019-09-12 DIAGNOSIS — R569 Unspecified convulsions: Secondary | ICD-10-CM | POA: Diagnosis not present

## 2019-09-12 DIAGNOSIS — E1122 Type 2 diabetes mellitus with diabetic chronic kidney disease: Secondary | ICD-10-CM | POA: Diagnosis not present

## 2019-09-12 DIAGNOSIS — Z7982 Long term (current) use of aspirin: Secondary | ICD-10-CM

## 2019-09-12 DIAGNOSIS — L89311 Pressure ulcer of right buttock, stage 1: Secondary | ICD-10-CM | POA: Diagnosis present

## 2019-09-12 DIAGNOSIS — J9600 Acute respiratory failure, unspecified whether with hypoxia or hypercapnia: Secondary | ICD-10-CM | POA: Diagnosis not present

## 2019-09-12 DIAGNOSIS — J9601 Acute respiratory failure with hypoxia: Secondary | ICD-10-CM | POA: Diagnosis present

## 2019-09-12 DIAGNOSIS — Z8674 Personal history of sudden cardiac arrest: Secondary | ICD-10-CM

## 2019-09-12 DIAGNOSIS — I252 Old myocardial infarction: Secondary | ICD-10-CM

## 2019-09-12 DIAGNOSIS — J189 Pneumonia, unspecified organism: Secondary | ICD-10-CM | POA: Diagnosis present

## 2019-09-12 DIAGNOSIS — Z7189 Other specified counseling: Secondary | ICD-10-CM | POA: Diagnosis not present

## 2019-09-12 DIAGNOSIS — G8191 Hemiplegia, unspecified affecting right dominant side: Secondary | ICD-10-CM | POA: Diagnosis present

## 2019-09-12 DIAGNOSIS — Z9119 Patient's noncompliance with other medical treatment and regimen: Secondary | ICD-10-CM

## 2019-09-12 DIAGNOSIS — G92 Toxic encephalopathy: Secondary | ICD-10-CM | POA: Diagnosis present

## 2019-09-12 DIAGNOSIS — I10 Essential (primary) hypertension: Secondary | ICD-10-CM | POA: Diagnosis present

## 2019-09-12 DIAGNOSIS — Z794 Long term (current) use of insulin: Secondary | ICD-10-CM | POA: Diagnosis not present

## 2019-09-12 DIAGNOSIS — I6310 Cerebral infarction due to embolism of unspecified precerebral artery: Secondary | ICD-10-CM | POA: Diagnosis not present

## 2019-09-12 DIAGNOSIS — J449 Chronic obstructive pulmonary disease, unspecified: Secondary | ICD-10-CM | POA: Diagnosis present

## 2019-09-12 DIAGNOSIS — L89321 Pressure ulcer of left buttock, stage 1: Secondary | ICD-10-CM | POA: Diagnosis present

## 2019-09-12 DIAGNOSIS — F1011 Alcohol abuse, in remission: Secondary | ICD-10-CM | POA: Diagnosis not present

## 2019-09-12 DIAGNOSIS — E1142 Type 2 diabetes mellitus with diabetic polyneuropathy: Secondary | ICD-10-CM | POA: Diagnosis not present

## 2019-09-12 DIAGNOSIS — J155 Pneumonia due to Escherichia coli: Secondary | ICD-10-CM | POA: Diagnosis not present

## 2019-09-12 DIAGNOSIS — D696 Thrombocytopenia, unspecified: Secondary | ICD-10-CM | POA: Diagnosis present

## 2019-09-12 DIAGNOSIS — Z8249 Family history of ischemic heart disease and other diseases of the circulatory system: Secondary | ICD-10-CM

## 2019-09-12 DIAGNOSIS — Z915 Personal history of self-harm: Secondary | ICD-10-CM

## 2019-09-12 DIAGNOSIS — E876 Hypokalemia: Secondary | ICD-10-CM | POA: Diagnosis present

## 2019-09-12 DIAGNOSIS — H510 Palsy (spasm) of conjugate gaze: Secondary | ICD-10-CM | POA: Diagnosis present

## 2019-09-12 DIAGNOSIS — J9690 Respiratory failure, unspecified, unspecified whether with hypoxia or hypercapnia: Secondary | ICD-10-CM

## 2019-09-12 DIAGNOSIS — R29725 NIHSS score 25: Secondary | ICD-10-CM | POA: Diagnosis present

## 2019-09-12 DIAGNOSIS — Z79899 Other long term (current) drug therapy: Secondary | ICD-10-CM

## 2019-09-12 DIAGNOSIS — I639 Cerebral infarction, unspecified: Secondary | ICD-10-CM | POA: Diagnosis not present

## 2019-09-12 DIAGNOSIS — E1169 Type 2 diabetes mellitus with other specified complication: Secondary | ICD-10-CM | POA: Diagnosis not present

## 2019-09-12 DIAGNOSIS — E1165 Type 2 diabetes mellitus with hyperglycemia: Secondary | ICD-10-CM | POA: Diagnosis present

## 2019-09-12 DIAGNOSIS — E8729 Other acidosis: Secondary | ICD-10-CM

## 2019-09-12 DIAGNOSIS — A419 Sepsis, unspecified organism: Secondary | ICD-10-CM | POA: Diagnosis not present

## 2019-09-12 DIAGNOSIS — R339 Retention of urine, unspecified: Secondary | ICD-10-CM | POA: Diagnosis not present

## 2019-09-12 DIAGNOSIS — I6340 Cerebral infarction due to embolism of unspecified cerebral artery: Secondary | ICD-10-CM | POA: Diagnosis not present

## 2019-09-12 DIAGNOSIS — E1140 Type 2 diabetes mellitus with diabetic neuropathy, unspecified: Secondary | ICD-10-CM | POA: Diagnosis present

## 2019-09-12 DIAGNOSIS — G40909 Epilepsy, unspecified, not intractable, without status epilepticus: Secondary | ICD-10-CM | POA: Diagnosis present

## 2019-09-12 DIAGNOSIS — I2510 Atherosclerotic heart disease of native coronary artery without angina pectoris: Secondary | ICD-10-CM | POA: Diagnosis present

## 2019-09-12 DIAGNOSIS — E875 Hyperkalemia: Secondary | ICD-10-CM | POA: Diagnosis not present

## 2019-09-12 DIAGNOSIS — E119 Type 2 diabetes mellitus without complications: Secondary | ICD-10-CM

## 2019-09-12 DIAGNOSIS — K567 Ileus, unspecified: Secondary | ICD-10-CM | POA: Diagnosis not present

## 2019-09-12 DIAGNOSIS — J432 Centrilobular emphysema: Secondary | ICD-10-CM | POA: Diagnosis not present

## 2019-09-12 DIAGNOSIS — G8929 Other chronic pain: Secondary | ICD-10-CM | POA: Diagnosis present

## 2019-09-12 DIAGNOSIS — F332 Major depressive disorder, recurrent severe without psychotic features: Secondary | ICD-10-CM | POA: Diagnosis present

## 2019-09-12 DIAGNOSIS — Z4682 Encounter for fitting and adjustment of non-vascular catheter: Secondary | ICD-10-CM | POA: Diagnosis not present

## 2019-09-12 DIAGNOSIS — G40409 Other generalized epilepsy and epileptic syndromes, not intractable, without status epilepticus: Secondary | ICD-10-CM | POA: Diagnosis not present

## 2019-09-12 DIAGNOSIS — L8990 Pressure ulcer of unspecified site, unspecified stage: Secondary | ICD-10-CM | POA: Insufficient documentation

## 2019-09-12 DIAGNOSIS — I63132 Cerebral infarction due to embolism of left carotid artery: Secondary | ICD-10-CM | POA: Diagnosis not present

## 2019-09-12 DIAGNOSIS — J690 Pneumonitis due to inhalation of food and vomit: Secondary | ICD-10-CM | POA: Diagnosis not present

## 2019-09-12 DIAGNOSIS — I82402 Acute embolism and thrombosis of unspecified deep veins of left lower extremity: Secondary | ICD-10-CM | POA: Diagnosis not present

## 2019-09-12 DIAGNOSIS — F251 Schizoaffective disorder, depressive type: Secondary | ICD-10-CM | POA: Diagnosis present

## 2019-09-12 DIAGNOSIS — F1310 Sedative, hypnotic or anxiolytic abuse, uncomplicated: Secondary | ICD-10-CM | POA: Diagnosis present

## 2019-09-12 LAB — CBC WITH DIFFERENTIAL/PLATELET
Abs Immature Granulocytes: 0.09 10*3/uL — ABNORMAL HIGH (ref 0.00–0.07)
Basophils Absolute: 0.1 10*3/uL (ref 0.0–0.1)
Basophils Relative: 0 %
Eosinophils Absolute: 0 10*3/uL (ref 0.0–0.5)
Eosinophils Relative: 0 %
HCT: 53.5 % — ABNORMAL HIGH (ref 39.0–52.0)
Hemoglobin: 16.9 g/dL (ref 13.0–17.0)
Immature Granulocytes: 1 %
Lymphocytes Relative: 7 %
Lymphs Abs: 1 10*3/uL (ref 0.7–4.0)
MCH: 30.2 pg (ref 26.0–34.0)
MCHC: 31.6 g/dL (ref 30.0–36.0)
MCV: 95.5 fL (ref 80.0–100.0)
Monocytes Absolute: 1.4 10*3/uL — ABNORMAL HIGH (ref 0.1–1.0)
Monocytes Relative: 10 %
Neutro Abs: 12.2 10*3/uL — ABNORMAL HIGH (ref 1.7–7.7)
Neutrophils Relative %: 82 %
Platelets: 224 10*3/uL (ref 150–400)
RBC: 5.6 MIL/uL (ref 4.22–5.81)
RDW: 14.9 % (ref 11.5–15.5)
WBC: 14.8 10*3/uL — ABNORMAL HIGH (ref 4.0–10.5)
nRBC: 0 % (ref 0.0–0.2)

## 2019-09-12 LAB — COMPREHENSIVE METABOLIC PANEL
ALT: 65 U/L — ABNORMAL HIGH (ref 0–44)
AST: 104 U/L — ABNORMAL HIGH (ref 15–41)
Albumin: 4.6 g/dL (ref 3.5–5.0)
Alkaline Phosphatase: 78 U/L (ref 38–126)
Anion gap: >20 — ABNORMAL HIGH (ref 5–15)
BUN: 100 mg/dL — ABNORMAL HIGH (ref 6–20)
CO2: 15 mmol/L — ABNORMAL LOW (ref 22–32)
Calcium: 9.8 mg/dL (ref 8.9–10.3)
Chloride: 116 mmol/L — ABNORMAL HIGH (ref 98–111)
Creatinine, Ser: 3.81 mg/dL — ABNORMAL HIGH (ref 0.61–1.24)
GFR calc Af Amer: 19 mL/min — ABNORMAL LOW (ref >60)
GFR calc non Af Amer: 16 mL/min — ABNORMAL LOW (ref >60)
Glucose, Bld: 310 mg/dL — ABNORMAL HIGH (ref 70–99)
Potassium: 4.8 mmol/L (ref 3.5–5.1)
Sodium: 152 mmol/L — ABNORMAL HIGH (ref 135–145)
Total Bilirubin: 1.5 mg/dL — ABNORMAL HIGH (ref 0.3–1.2)
Total Protein: 8.5 g/dL — ABNORMAL HIGH (ref 6.5–8.1)

## 2019-09-12 LAB — URINALYSIS, COMPLETE (UACMP) WITH MICROSCOPIC
Bacteria, UA: NONE SEEN
Bilirubin Urine: NEGATIVE
Glucose, UA: NEGATIVE mg/dL
Hgb urine dipstick: NEGATIVE
Ketones, ur: 20 mg/dL — AB
Leukocytes,Ua: NEGATIVE
Nitrite: NEGATIVE
Protein, ur: 100 mg/dL — AB
Specific Gravity, Urine: 1.018 (ref 1.005–1.030)
pH: 5 (ref 5.0–8.0)

## 2019-09-12 LAB — BLOOD GAS, ARTERIAL
Acid-base deficit: 12.8 mmol/L — ABNORMAL HIGH (ref 0.0–2.0)
Acid-base deficit: 9.4 mmol/L — ABNORMAL HIGH (ref 0.0–2.0)
Bicarbonate: 15.1 mmol/L — ABNORMAL LOW (ref 20.0–28.0)
Bicarbonate: 16.9 mmol/L — ABNORMAL LOW (ref 20.0–28.0)
FIO2: 32
FIO2: 100
O2 Saturation: 97.1 %
O2 Saturation: 99.4 %
Patient temperature: 37.3
Patient temperature: 37
pCO2 arterial: 27.2 mmHg — ABNORMAL LOW (ref 32.0–48.0)
pCO2 arterial: 36.2 mmHg (ref 32.0–48.0)
pH, Arterial: 7.286 — ABNORMAL LOW (ref 7.350–7.450)
pH, Arterial: 7.271 — ABNORMAL LOW (ref 7.350–7.450)
pO2, Arterial: 111 mmHg — ABNORMAL HIGH (ref 83.0–108.0)
pO2, Arterial: 388 mmHg — ABNORMAL HIGH (ref 83.0–108.0)

## 2019-09-12 LAB — PROTIME-INR
INR: 1.1 (ref 0.8–1.2)
Prothrombin Time: 14 seconds (ref 11.4–15.2)

## 2019-09-12 LAB — RENAL FUNCTION PANEL
Albumin: 3.4 g/dL — ABNORMAL LOW (ref 3.5–5.0)
Anion gap: 12 (ref 5–15)
BUN: 98 mg/dL — ABNORMAL HIGH (ref 6–20)
CO2: 15 mmol/L — ABNORMAL LOW (ref 22–32)
Calcium: 8.2 mg/dL — ABNORMAL LOW (ref 8.9–10.3)
Chloride: 120 mmol/L — ABNORMAL HIGH (ref 98–111)
Creatinine, Ser: 3.32 mg/dL — ABNORMAL HIGH (ref 0.61–1.24)
GFR calc Af Amer: 22 mL/min — ABNORMAL LOW (ref >60)
GFR calc non Af Amer: 19 mL/min — ABNORMAL LOW (ref >60)
Glucose, Bld: 265 mg/dL — ABNORMAL HIGH (ref 70–99)
Phosphorus: 6 mg/dL — ABNORMAL HIGH (ref 2.5–4.6)
Potassium: 4.4 mmol/L (ref 3.5–5.1)
Sodium: 147 mmol/L — ABNORMAL HIGH (ref 135–145)

## 2019-09-12 LAB — RAPID URINE DRUG SCREEN, HOSP PERFORMED
Amphetamines: NOT DETECTED
Barbiturates: NOT DETECTED
Benzodiazepines: NOT DETECTED
Cocaine: NOT DETECTED
Opiates: NOT DETECTED
Tetrahydrocannabinol: NOT DETECTED

## 2019-09-12 LAB — CBG MONITORING, ED: Glucose-Capillary: 292 mg/dL — ABNORMAL HIGH (ref 70–99)

## 2019-09-12 LAB — POC SARS CORONAVIRUS 2 AG -  ED: SARS Coronavirus 2 Ag: NEGATIVE

## 2019-09-12 LAB — TRIGLYCERIDES: Triglycerides: 219 mg/dL — ABNORMAL HIGH (ref <150)

## 2019-09-12 LAB — LACTIC ACID, PLASMA
Lactic Acid, Venous: 2.7 mmol/L (ref 0.5–1.9)
Lactic Acid, Venous: 0.9 mmol/L (ref 0.5–1.9)

## 2019-09-12 LAB — AMMONIA: Ammonia: 29 umol/L (ref 9–35)

## 2019-09-12 LAB — SARS CORONAVIRUS 2 BY RT PCR (HOSPITAL ORDER, PERFORMED IN ~~LOC~~ HOSPITAL LAB): SARS Coronavirus 2: NEGATIVE

## 2019-09-12 LAB — ACETAMINOPHEN LEVEL: Acetaminophen (Tylenol), Serum: <10 ug/mL — ABNORMAL LOW (ref 10–30)

## 2019-09-12 LAB — APTT: aPTT: 25 seconds (ref 24–36)

## 2019-09-12 LAB — SALICYLATE LEVEL: Salicylate Lvl: <7 mg/dL (ref 2.8–30.0)

## 2019-09-12 LAB — ETHANOL: Alcohol, Ethyl (B): <10 mg/dL (ref <10)

## 2019-09-12 MED ORDER — ETOMIDATE 2 MG/ML IV SOLN
INTRAVENOUS | Status: AC | PRN
  Administered 2019-09-12: 20 mg via INTRAVENOUS

## 2019-09-12 MED ORDER — SODIUM BICARBONATE 8.4 % IV SOLN
INTRAVENOUS | Status: AC
  Filled 2019-09-12: qty 50

## 2019-09-12 MED ORDER — SODIUM CHLORIDE 0.9 % IV BOLUS (SEPSIS)
1000.0000 mL | Freq: Once | INTRAVENOUS | Status: AC
  Administered 2019-09-12: 1000 mL via INTRAVENOUS

## 2019-09-12 MED ORDER — FENTANYL CITRATE (PF) 100 MCG/2ML IJ SOLN
100.0000 ug | INTRAMUSCULAR | Status: DC | PRN
  Filled 2019-09-12: qty 2

## 2019-09-12 MED ORDER — VANCOMYCIN HCL IN DEXTROSE 1-5 GM/200ML-% IV SOLN
1000.0000 mg | INTRAVENOUS | Status: AC
  Filled 2019-09-12: qty 200

## 2019-09-12 MED ORDER — SODIUM CHLORIDE 0.9 % IV SOLN
15.0000 mg/kg | Freq: Once | INTRAVENOUS | Status: AC
  Administered 2019-09-12: 1480 mg via INTRAVENOUS
  Filled 2019-09-12: qty 1.48

## 2019-09-12 MED ORDER — SODIUM BICARBONATE 8.4 % IV SOLN
50.0000 meq | Freq: Once | INTRAVENOUS | Status: AC
  Administered 2019-09-12: 50 meq via INTRAVENOUS

## 2019-09-12 MED ORDER — SODIUM CHLORIDE 0.9 % IV BOLUS
1000.0000 mL | Freq: Once | INTRAVENOUS | Status: AC
  Administered 2019-09-12: 1000 mL via INTRAVENOUS

## 2019-09-12 MED ORDER — PROPOFOL 1000 MG/100ML IV EMUL
0.0000 ug/kg/min | INTRAVENOUS | Status: DC
  Administered 2019-09-12: 5 ug/kg/min via INTRAVENOUS
  Filled 2019-09-12: qty 100

## 2019-09-12 MED ORDER — SODIUM CHLORIDE 0.9 % IV SOLN
1.0000 g | INTRAVENOUS | Status: DC
  Administered 2019-09-13: 18:00:00 1 g via INTRAVENOUS
  Filled 2019-09-12 (×3): qty 1

## 2019-09-12 MED ORDER — FENTANYL CITRATE (PF) 100 MCG/2ML IJ SOLN: 100.0000 ug | INTRAMUSCULAR | Status: DC | PRN

## 2019-09-12 MED ORDER — PYRIDOXINE HCL 100 MG/ML IJ SOLN
100.0000 mg | Freq: Every day | INTRAMUSCULAR | Status: DC
  Administered 2019-09-12 – 2019-09-27 (×16): 100 mg via INTRAVENOUS
  Filled 2019-09-12 (×18): qty 1

## 2019-09-12 MED ORDER — METRONIDAZOLE IN NACL 5-0.79 MG/ML-% IV SOLN
500.0000 mg | Freq: Once | INTRAVENOUS | Status: AC
  Administered 2019-09-12: 500 mg via INTRAVENOUS
  Filled 2019-09-12: qty 100

## 2019-09-12 MED ORDER — THIAMINE HCL 100 MG/ML IJ SOLN
100.0000 mg | Freq: Once | INTRAMUSCULAR | Status: AC
  Administered 2019-09-12: 100 mg via INTRAVENOUS
  Filled 2019-09-12: qty 2

## 2019-09-12 MED ORDER — ROCURONIUM BROMIDE 50 MG/5ML IV SOLN
INTRAVENOUS | Status: AC | PRN
  Administered 2019-09-12: 120 mg via INTRAVENOUS

## 2019-09-12 MED ORDER — VANCOMYCIN HCL IN DEXTROSE 1-5 GM/200ML-% IV SOLN
1000.0000 mg | INTRAVENOUS | Status: DC
  Administered 2019-09-13: 16:00:00 1000 mg via INTRAVENOUS
  Filled 2019-09-12 (×2): qty 200

## 2019-09-12 MED ORDER — SODIUM CHLORIDE 0.9 % IV SOLN
2.0000 g | Freq: Once | INTRAVENOUS | Status: AC
  Administered 2019-09-12: 2 g via INTRAVENOUS
  Filled 2019-09-12: qty 2

## 2019-09-12 MED ORDER — VANCOMYCIN HCL IN DEXTROSE 1-5 GM/200ML-% IV SOLN: 1000.0000 mg | Freq: Once | INTRAVENOUS | Status: DC

## 2019-09-12 MED ORDER — FOLIC ACID 5 MG/ML IJ SOLN
50.0000 mg | Freq: Four times a day (QID) | INTRAMUSCULAR | Status: DC
  Filled 2019-09-12 (×9): qty 10

## 2019-09-12 MED ORDER — SODIUM CHLORIDE 0.9 % IV SOLN
50.0000 mg | Freq: Four times a day (QID) | INTRAVENOUS | Status: DC
  Administered 2019-09-12 – 2019-09-14 (×7): 50 mg via INTRAVENOUS
  Filled 2019-09-12 (×14): qty 10

## 2019-09-12 MED ORDER — SODIUM BICARBONATE-DEXTROSE 150-5 MEQ/L-% IV SOLN
150.0000 meq | INTRAVENOUS | Status: DC
  Administered 2019-09-13: 02:00:00 150 meq via INTRAVENOUS
  Filled 2019-09-12 (×4): qty 1000

## 2019-09-12 NOTE — ED Triage Notes (Signed)
Pt came to ed by [employer].  Officer reports pt was arrested 2 days ago for "drunk and disruptive."  Since incarcerated pt hasn't ate, drank, or been ambulatory.

## 2019-09-12 NOTE — ED Provider Notes (Signed)
[facility] EMERGENCY DEPARTMENT Provider Note   CSN: [id number] Arrival date & time: 09/12/19  1304     History   Chief Complaint Chief Complaint  Patient presents with  . Altered Mental Status    HPI [name] is a 58 y.o. male with a past medical history of schizoaffective disorder, polysubstance misuse disorder with previous episodes of alcohol abuse, crack cocaine abuse, benzodiazepine abuse COPD, diabetes, hypertension, previous MI.  Patient also has a history of frequent ethylene glycol ingestion episodes and is well-known to this emergency department.  Today the patient is brought in by [facility] department from jail because he has not been walking or talking for the past 2 days.  He was apparently arrested 2 days ago for drunken disruptive behavior.  Since that time he has not been eating, been minimally responsive or ambulating and was found to be obtunded with a respiratory rate in the 40s and brought in by a sheriff's deputy in the sherrif's car.  There is a level 5 caveat due to altered mental status and acuity of condition.     HPI  Past Medical History:  Diagnosis Date  . Alcohol dependence ([place]) 1979   stated abusing ETOH at age 45   . Back pain   . Benzodiazepine dependence ([place])   . Cardiac arrest ([place])   . Chronic back pain   . Cocaine abuse ([place])   . COPD (chronic obstructive pulmonary disease) ([place])   . Diabetes mellitus without complication ([place]) [id number]  . Hypertension 2008  . MI (myocardial infarction) ([place])   . Non-compliance   . Schizoaffective disorder ([place]) 2006   . Substance abuse [facility])     Patient Active Problem List   Diagnosis Date Noted  . Acute renal failure (ARF) ([place]) 09/12/2019  . Poisoning, antifreeze   . Suicide attempt ([place])   . Overdose 04/14/2018  . MDD (major depressive disorder) 11/12/2017  . 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 Diabetic neuropathy ([place])  11/11/2016  . Anxiety and depression 11/11/2016  . Chronic insomnia 01/08/2016  . Alcohol use disorder 12/10/2015  . Benzodiazepine abuse ([place]) 12/10/2015  . Opioid abuse ([place]) 12/10/2015  . Constipation 02/06/2015  . BPH (benign prostatic hyperplasia) 01/16/2015  . Vitamin D deficiency 01/16/2015  . Allergic rhinitis 11/19/2014  . Chronic low back pain 11/07/2014  . Onychomycosis of toenail 11/07/2014  . Tinea pedis 11/07/2014  . Psoriasis 10/14/2014  . HTN (hypertension) 10/14/2014  . Diabetes mellitus type 2, controlled ([place]) 10/14/2014  . COPD (chronic obstructive pulmonary disease) ([place]) 10/14/2014  . Tobacco abuse 10/14/2014  . HLD (hyperlipidemia) 10/14/2014  . Schizoaffective disorder, depressive type ([place]) 10/14/2014    Past Surgical History:  Procedure Laterality Date  . CYST EXCISION          Home Medications    Prior to Admission medications   Medication Sig Start Date End Date Taking? Authorizing Provider  aspirin EC 81 MG tablet Take 81 mg by mouth daily.   Yes [provider]  atorvastatin (LIPITOR) 40 MG tablet Take 40 mg by mouth daily.   Yes [provider]  baclofen (LIORESAL) 10 MG tablet Take 10 mg by mouth 3 (three) times daily as needed for muscle spasms.   Yes [provider]  budesonide-formoterol (SYMBICORT) 80-4.5 MCG/ACT inhaler Inhale 2 puffs into the lungs 2 (two) times daily.   Yes [provider]  cyclobenzaprine (FLEXERIL) 10 MG tablet Take 10 mg  by mouth 3 (three) times daily as needed for muscle spasms.   Yes [provider]  hydrOXYzine (VISTARIL) 50 MG capsule Take 50 mg by mouth 3 (three) times daily.   Yes [provider]  insulin aspart (NOVOLOG) 100 UNIT/ML injection Inject 1-30 Units into the skin 3 (three) times daily with meals. SLIDING SCALE 02/12/19  Yes [provider]  Insulin Glargine (BASAGLAR KWIKPEN) 100 UNIT/ML SOPN Inject 20 Units into the skin at bedtime.   Yes  [provider]  isosorbide mononitrate (IMDUR) 30 MG 24 hr tablet Take 30 mg by mouth daily.   Yes [provider]  LEVEMIR 100 UNIT/ML injection Inject 20 Units into the skin at bedtime.  04/26/19  Yes [provider]  lisinopril (PRINIVIL,ZESTRIL) 10 MG tablet Take 1 tablet by mouth daily. 12/12/17  Yes [provider]  metFORMIN (GLUCOPHAGE-XR) 500 MG 24 hr tablet Take 2 tablets (1,000 mg total) by mouth 2 (two) times daily with a meal. For diabetes management 06/02/19  Yes Aline August, MD  metoprolol succinate (TOPROL-XL) 25 MG 24 hr tablet Take 25 mg by mouth 2 (two) times daily.    Yes [provider]  risperiDONE (RISPERDAL) 2 MG tablet Take 2 mg by mouth at bedtime. [place] ON STARTING ON 09/05/2019   Yes [provider]    Family History Family History  Problem Relation Age of Onset  . Heart disease Mother   . Heart disease Father   . Heart disease Maternal Grandmother   . Diabetes Maternal Grandmother     Social History Social History   Tobacco Use  . Smoking status: Current Every Day Smoker    Packs/day: 1.50    Types: Cigarettes  . Smokeless tobacco: Never Used  Substance Use Topics  . Alcohol use: Yes    Alcohol/week: 0.0 standard drinks    Comment: last drank week ago  . Drug use: Not Currently    Types: Cocaine    Comment: Denies any use in past 6 months     Allergies   Quetiapine, Seroquel [quetiapine fumarate], Trazodone, Trazodone and nefazodone, Vistaril [hydroxyzine hcl], and Haldol [haloperidol lactate]   Review of Systems Review of Systems  Unable to perform ROS: Acuity of condition     Physical Exam Updated Vital Signs BP 97/68   Pulse 93   Temp 99.1 F (37.3 C) (Rectal)   Resp (!) 22   Wt 98.9 kg   SpO2 (!) 81%   BMI 30.40 kg/m   Physical Exam Vitals signs and nursing note reviewed.  Constitutional:      General: He is in acute distress.     Appearance: He is  ill-appearing and toxic-appearing.  HENT:     Head: Normocephalic and atraumatic.     Nose: Nose normal.     Mouth/Throat:     Comments: desiccated oral mucosa Eyes:     Extraocular Movements: Extraocular movements intact.     Pupils: Pupils are equal, round, and reactive to light.  Cardiovascular:     Rate and Rhythm: Tachycardia present.     Heart sounds: No murmur. No gallop.   Pulmonary:     Effort: Tachypnea present.     Breath sounds: No stridor, decreased air movement or transmitted upper airway sounds. No wheezing, rhonchi or rales.  Abdominal:     General: There is no distension.     Palpations: Abdomen is soft.     Tenderness: There is no abdominal tenderness.  Genitourinary:    Penis: Normal and circumcised.        Comments: In and out cath with 1485ml of tea colored urine output Musculoskeletal:        General: No swelling or tenderness.     Right lower leg: No edema.     Left lower leg: No edema.  Skin:    Comments: Cold, cyanotic hands and feet with 10 sec capillary refill  Neurological:     GCS: GCS eye subscore is 4. GCS verbal subscore is 2. GCS motor subscore is 1.     Comments: Eye are open Looks at speaking person       ED Treatments / Results  Labs (all labs ordered are listed, but only abnormal results are displayed) Labs Reviewed  COMPREHENSIVE METABOLIC PANEL - Abnormal; Notable for the following components:      Result Value   Sodium 152 (*)    Chloride 116 (*)    CO2 15 (*)    Glucose, Bld 310 (*)    BUN 100 (*)    Creatinine, Ser 3.81 (*)    Total Protein 8.5 (*)    AST 104 (*)    ALT 65 (*)    Total Bilirubin 1.5 (*)    GFR calc non Af Amer 16 (*)    GFR calc Af Amer 19 (*)    Anion gap >20 (*)    All other components within normal limits  LACTIC ACID, PLASMA - Abnormal; Notable for the following components:   Lactic Acid, Venous 2.7 (*)    All other components within normal limits  CBC WITH DIFFERENTIAL/PLATELET - Abnormal;  Notable for the following components:   WBC 14.8 (*)    HCT 53.5 (*)    Neutro Abs 12.2 (*)    Monocytes Absolute 1.4 (*)    Abs Immature Granulocytes 0.09 (*)    All other components within normal limits  URINALYSIS, COMPLETE (UACMP) WITH MICROSCOPIC - Abnormal; Notable for the following components:   Ketones, ur 20 (*)    Protein, ur 100 (*)    All other components within normal limits  BLOOD GAS, ARTERIAL - Abnormal; Notable for the following components:   pH, Arterial 7.286 (*)    pCO2 arterial 27.2 (*)    pO2, Arterial 111 (*)    Bicarbonate 15.1 (*)    Acid-base deficit 12.8 (*)    All other components within normal limits  ACETAMINOPHEN LEVEL - Abnormal; Notable for the following components:   Acetaminophen (Tylenol), Serum <10 (*)    All other components within normal limits  CBG MONITORING, ED - Abnormal; Notable for the following components:   Glucose-Capillary 292 (*)    All other components within normal limits  CULTURE, BLOOD (ROUTINE X 2)  CULTURE, BLOOD (ROUTINE X 2)  URINE CULTURE  LACTIC ACID, PLASMA  PROTIME-INR  AMMONIA  RAPID URINE DRUG SCREEN, HOSP PERFORMED  ETHANOL  APTT  SALICYLATE LEVEL  ETHYLENE GLYCOL  VOLATILES,BLD-ACETONE,ETHANOL,ISOPROP,METHANOL  OSMOLALITY  POC SARS CORONAVIRUS 2 AG -  ED    EKG EKG Interpretation  Date/Time:  Wednesday September 12 2019 13:49:18 EST Ventricular Rate:  117 PR Interval:    QRS Duration: 73 QT Interval:  319 QTC Calculation: 445 R Axis:   85 Text Interpretation: Sinus tachycardia Otherwise normal ecg Confirmed by Veryl Speak [id number]) on 09/12/2019 2:24:28 PM   Radiology Dg Chest Port 1 View  Result Date: 09/12/2019 CLINICAL DATA:  Tachypnea. EXAM: PORTABLE CHEST 1 VIEW COMPARISON:  September 08, 2019. FINDINGS: The heart size and mediastinal contours are within normal limits. No pneumothorax is noted. Right lung is clear. Possible mild left lateral basilar opacity is noted which may represent  atelectasis or infiltrate. The visualized skeletal structures are unremarkable. IMPRESSION: Possible mild left lateral basilar opacity is noted which may represent atelectasis or infiltrate. Followup radiographs are recommended until resolution. No other abnormality seen in the chest. Electronically Signed   By: Marijo Conception M.D.   On: 09/12/2019 14:19    Procedures .Critical Care Performed by: Margarita Mail, PA-C Authorized by: Margarita Mail, PA-C   Critical care provider statement:    Critical care time (minutes):  70   Critical care time was exclusive of:  Separately billable procedures and treating other patients   Critical care was necessary to treat or prevent imminent or life-threatening deterioration of the following conditions:  CNS failure or compromise, metabolic crisis, renal failure, respiratory failure and shock   Critical care was time spent personally by me on the following activities:  Discussions with consultants, evaluation of patient's response to treatment, examination of patient, ordering and performing treatments and interventions, ordering and review of laboratory studies, ordering and review of radiographic studies, pulse oximetry, re-evaluation of patient's condition, obtaining history from patient or surrogate, review of old charts and blood draw for specimens .Central Line  Date/Time: 09/12/2019 5:24 PM Performed by: Margarita Mail, PA-C Authorized by: Margarita Mail, PA-C   Consent:    Consent obtained:  Verbal   Consent given by:  Patient   Risks discussed:  Pneumothorax, infection, incorrect placement, arterial puncture and bleeding   Alternatives discussed:  No treatment Pre-procedure details:    Hand hygiene: Hand hygiene performed prior to insertion     Skin preparation:  2% chlorhexidine   Skin preparation agent: Skin preparation agent completely dried prior to procedure   Procedure details:    Location:  L internal jugular (Rapid IJ placement  20g cathter)   Patient position:  Trendelenburg   Landmarks identified: yes     Ultrasound guidance: yes     Number of attempts:  1   Successful placement: yes   Post-procedure details:    Post-procedure:  Dressing applied   Assessment:  Free fluid flow   Patient tolerance of procedure:  Tolerated well, no immediate complications   (including critical care time)  Medications Ordered in ED Medications  sodium chloride 0.9 % bolus 1,000 mL (0 mLs Intravenous Stopped 09/12/19 1738)    And  sodium chloride 0.9 % bolus 1,000 mL (1,000 mLs Intravenous New Bag/Given 09/12/19 1347)    And  sodium chloride 0.9 % bolus 1,000 mL (has no administration in time range)  vancomycin (VANCOCIN) IVPB 1000 mg/200 mL premix (1,000 mg Intravenous Not Given 09/12/19 1731)  fomepizole (ANTIZOL) 1,480 mg in sodium chloride 0.9 % 100 mL IVPB (1,480 mg Intravenous New Bag/Given 09/12/19 1737)  pyridOXINE (B-6) injection 100 mg (100 mg Intravenous Given 09/12/19 1739)  ceFEPIme (MAXIPIME) 1 g in sodium chloride 0.9 % 100 mL IVPB (has no administration in time range)  vancomycin (VANCOCIN) IVPB 1000 mg/200 mL premix (has no administration in time range)  folic acid 50 mg in sodium chloride 0.9 % 50 mL IVPB (has no administration in time range)  sodium bicarbonate injection 50 mEq (has no administration in time range)  ceFEPIme (MAXIPIME) 2 g in sodium chloride 0.9 % 100 mL IVPB (0 g Intravenous Stopped 09/12/19 1441)  metroNIDAZOLE (FLAGYL) IVPB 500 mg (0 mg  Intravenous Stopped 09/12/19 1633)  thiamine (B-1) injection 100 mg (100 mg Intravenous Given 09/12/19 1739)     Initial Impression / Assessment and Plan / ED Course  I have reviewed the triage vital signs and the nursing notes.  Pertinent labs & imaging results that were available during my care of the patient were reviewed by me and considered in my medical decision making (see chart for details).  Clinical Course as of Sep 11 1744  Wed Sep 12, 2019   1413 pH, Arterial(!): 7.286 [AH]  1413 pCO2 arterial(!): 27.2 [AH]  1413 pO2, Arterial(!): 111 [AH]  1414 Bicarbonate(!): 15.1 [AH]  1444 WBC(!): 14.8 [AH]  1504 Sodium(!): 152 [AH]    Clinical Course User Index [AH] Margarita Mail, PA-C       [id number] mental status VS:  Vitals:   09/12/19 1630 09/12/19 1645 09/12/19 1700 09/12/19 1715  BP: 97/68     Pulse:  (!) 31 (!) 110 93  Resp:      Temp:      TempSrc:      SpO2:  (!) 80% 100% (!) 81%  Weight:        [id number] is gathered by nursing staff, [facility] and EMR. DDX:The differential diagnosis for AMS is extensive and includes, but is not limited to: drug overdose - opioids, alcohol, sedatives, antipsychotics, drug withdrawal, others; Metabolic: hypoxia, hypoglycemia, hyperglycemia, hypercalcemia, hypernatremia, hyponatremia, uremia, hepatic encephalopathy, hypothyroidism, hyperthyroidism, vitamin B12 or thiamine deficiency, carbon monoxide poisoning, Wilson's disease, Lactic acidosis, DKA/HHOS; Infectious: meningitis, encephalitis, bacteremia/sepsis, urinary tract infection, pneumonia, neurosyphilis; Structural: Space-occupying lesion, (brain tumor, subdural hematoma, hydrocephalus,); Vascular: stroke, subarachnoid hemorrhage, coronary ischemia, hypertensive encephalopathy, CNS vasculitis, thrombotic thrombocytopenic purpura, disseminated intravascular coagulation, hyperviscosity; Psychiatric: Schizophrenia, depression; Other: Seizure, hypothermia, heat stroke, ICU psychosis, dementia -"sundowning." Labs: I reviewed the labs which show CMP shows hyponatremia with a sodium of 152, elevated blood glucose, BUN of 100 and creatinine of 3.1.  Patient's ABG uncompensated metabolic acidosis.  Reviewed the patient's lab which shows ketones of 20 which are not markedly elevated.  No evidence of urinary tract infection.  Initial lactate of 2.7 now down to 0.9.  Negative salicylate and Tylenol level.  Covid test negative.  White blood cell count  shows elevated white blood cell count of 14.8.  imaging: I personally reviewed the images (portable chest 1 view) which show(s) left lower lobe opacity on my interpretation. EKG: EKG shows sinus tachycardia MDM: Is a 58 year old male who arrives with respiratory rate in the mid 40s, altered, dry, tachypneic and tachycardic from jail.  He has a history of previous ethylene glycol ingestion and suicide attempts.  Differential diagnosis for acute metabolic acidosis includes ethylene glycol ingestion, toxic alcohol ingestion, DKA, sepsis, other toxic ingestion.  Patient does not appear to have underlying DKA.  He does have a uremic acidosis with acute renal failure.  He is being treated for sepsis.  Head CT currently pending.  Patient's mental status has improved significantly with fluid rehydration.  He is more alert awake and alert, communicative, asking to drink fluids which he is taking both by mouth and through multiple peripheral IVs.  Patient will be will be admitted to critical care at [facility].  I spoke with Dr. Carolin Sicks of nephrology who asked that the patient be transferred to [facility] given his acute renal failure.  He also had acute urinary retention but is refusing catheter placement at this time. Patient disposition: Admit Patient condition: Critical but stabilizing. The patient appears reasonably screened and/or stabilized for  discharge and I doubt any other medical condition or other [facility] requiring further screening, evaluation, or treatment in the ED at this time prior to discharge. I have discussed lab and/or imaging findings with the patient and answered all questions/concerns to the best of my ability. I have discussed return precautions and OP follow up.    [name] was evaluated in Emergency Department on 09/12/2019 for the symptoms described in the history of present illness. He was evaluated in the context of the global COVID-19 pandemic, which necessitated consideration that the patient  might be at risk for infection with the SARS-CoV-2 virus that causes COVID-19. Institutional protocols and algorithms that pertain to the evaluation of patients at risk for COVID-19 are in a state of rapid change based on information released by regulatory bodies including the CDC and federal and state organizations. These policies and algorithms were followed during the patient's care in the ED.   Final Clinical Impressions(s) / ED Diagnoses   Final diagnoses:  Acute renal failure, unspecified acute renal failure type ([place])  Hypernatremia  Toxic metabolic encephalopathy  Dehydration  High anion gap metabolic acidosis    ED Discharge Orders    None       Margarita Mail, PA-C 09/12/19 1745    Ezequiel Essex, MD 09/13/19 [id number]    Ezequiel Essex, MD 09/13/19 [phone]

## 2019-09-12 NOTE — Code Documentation (Signed)
2033 ETT in + color change  25 at lip

## 2019-09-12 NOTE — Progress Notes (Signed)
Pharmacy Antibiotic Note  [name] is a 58 y.o. male admitted on 09/12/2019 with sepsis.  Pharmacy has been consulted for vancomycin and cefepime dosing.  Plan: Vancomycin 1000mg  IV every 24 hours.  Goal trough 15-20 mcg/mL. cefepime 2gm iv q24h  Weight: 218 lb (98.9 kg)  Temp (24hrs), Avg:99.1 F (37.3 C), Min:99.1 F (37.3 C), Max:99.1 F (37.3 C)  Recent Labs  Lab 09/12/19 1316  WBC 14.8*  CREATININE 3.81*  LATICACIDVEN 2.7*    Estimated Creatinine Clearance: 25 mL/min (A) (by C-G formula based on SCr of 3.81 mg/dL (H)).    Allergies  Allergen Reactions  . Quetiapine Anaphylaxis  . Seroquel [Quetiapine Fumarate] Anxiety and Other (See Comments)    Reaction:  Nightmares   . Trazodone Anaphylaxis  . Trazodone And Nefazodone Anxiety and Other (See Comments)    Other reaction(s): Unknown Nightmares Reaction:  Nightmares   . Vistaril [Hydroxyzine Hcl] Nausea And Vomiting  . Haldol [Haloperidol Lactate] Anxiety and Other (See Comments)    Reaction:  Nightmares     Antimicrobials this admission: 11/25 vancomycin >>  11/25 cefepime >>  11/25 metronidazole x 1   Microbiology results: 11/25 BCx: sent 11/25 UCx: sent    Thank you for allowing pharmacy to be a part of this patient's care.  Donna Christen Dalma Panchal 09/12/2019 3:23 PM

## 2019-09-12 NOTE — ED Notes (Signed)
Called and updated C-RN regarding patient's change of status. Stated that they can still take patient

## 2019-09-12 NOTE — ED Provider Notes (Signed)
  Care assumed from Dr. Stark Jock and Francee Piccolo at shift change.  Patient with psychiatric and polysubstance abuse history here with altered mental status, poor p.o. intake.  Came in obtunded with tachypnea, hypoxic and hypotensive.  He was treated for suspected sepsis with broad-spectrum antibiotics and IV fluids. He was found to have metabolic acidosis, AKI, hypernatremia.   Some concern for ethylene glycol ingestion as he has a history of this in the past but denies ingesting any today.  Metabolic acidosis could be from alcohol injection, sepsis, toxic ingestion.  He will be transferred to [facility] ICU.  Discussed with Dr. Halford Chessman.  We have also spoke with nephrology who evaluated patient wanting [place] to [facility].  Do not feel he needs emergent dialysis at this point.  Patient refuses Foley catheter.  Mental status and blood pressure are improving. Does have some episodes of hypoxia and his vital signs with this was not a good waveform  Patient with progressively worsening encephalopathy and more obtunded.  His CT head is negative.  Decision made to intubate to protect his airway.  We will send repeat blood gas to evaluate CO2 retention.  Patient intubated without incident.  Repeat ABG shows persistent metabolic acidosis without CO2 retention.  Will start bicarbonate drip.  Critical care updated Dr. Lucile Shutters. Awaiting transport. Remains Hemodynamically stable.   CRITICAL CARE Performed by: Ezequiel Essex Total critical care time: 4minutes Critical care time was exclusive of separately billable procedures and treating other patients. Critical care was necessary to treat or prevent imminent or life-threatening deterioration. Critical care was time spent personally by me on the following activities: development of treatment plan with patient and/or surrogate as well as nursing, discussions with consultants, evaluation of patient's response to treatment, examination of patient, obtaining  history from patient or surrogate, ordering and performing treatments and interventions, ordering and review of laboratory studies, ordering and review of radiographic studies, pulse oximetry and re-evaluation of patient's condition.     ED Course/Procedures   Clinical Course as of Sep 11 2010  Wed Sep 12, 2019  1413 pH, Arterial(!): 7.286 [AH]  1413 pCO2 arterial(!): 27.2 [AH]  1413 pO2, Arterial(!): 111 [AH]  1414 Bicarbonate(!): 15.1 [AH]  1444 WBC(!): 14.8 [AH]  1504 Sodium(!): 152 [AH]    Clinical Course User Index [AH] Margarita Mail, PA-C    Procedure Name: Intubation Date/Time: 09/12/2019 8:38 PM Performed by: Ezequiel Essex, MD Pre-anesthesia Checklist: Patient identified, Emergency Drugs available, Suction available, Timeout performed and Patient being monitored Oxygen Delivery Method: Non-rebreather mask Preoxygenation: Pre-oxygenation with 100% oxygen Induction Type: IV induction and Rapid sequence Ventilation: Mask ventilation without difficulty Laryngoscope Size: Glidescope and 4 Grade View: Grade I Tube size: 7.5 mm Number of attempts: 1 Airway Equipment and Method: Video-laryngoscopy and Rigid stylet Placement Confirmation: ETT inserted through vocal cords under direct vision,  Breath sounds checked- equal and bilateral,  Positive ETCO2 and CO2 detector Secured at: 25 cm Tube secured with: ETT holder Dental Injury: Teeth and Oropharynx as per pre-operative assessment  Difficulty Due To: Difficulty was anticipated, Difficult Airway- due to large tongue and Difficult Airway- due to dentition Future Recommendations: Recommend- induction with short-acting agent, and alternative techniques readily available           Ezequiel Essex, MD 09/12/19 2353

## 2019-09-12 NOTE — ED Notes (Signed)
Date and time results received: 09/12/19 1515 (use smartphrase ".now" to insert current time)  Test: Lactic Acid Critical Value: 2.7  Name of Provider Notified: Margarita Mail PA Orders Received? Or Actions Taken?: NA

## 2019-09-13 ENCOUNTER — Inpatient Hospital Stay (HOSPITAL_COMMUNITY): Payer: Medicare Other

## 2019-09-13 DIAGNOSIS — G9341 Metabolic encephalopathy: Secondary | ICD-10-CM | POA: Diagnosis not present

## 2019-09-13 DIAGNOSIS — L8990 Pressure ulcer of unspecified site, unspecified stage: Secondary | ICD-10-CM | POA: Insufficient documentation

## 2019-09-13 LAB — BASIC METABOLIC PANEL
Anion gap: 13 (ref 5–15)
Anion gap: 15 (ref 5–15)
BUN: 76 mg/dL — ABNORMAL HIGH (ref 6–20)
BUN: 95 mg/dL — ABNORMAL HIGH (ref 6–20)
CO2: 15 mmol/L — ABNORMAL LOW (ref 22–32)
CO2: 18 mmol/L — ABNORMAL LOW (ref 22–32)
Calcium: 8.3 mg/dL — ABNORMAL LOW (ref 8.9–10.3)
Calcium: 8.9 mg/dL (ref 8.9–10.3)
Chloride: 119 mmol/L — ABNORMAL HIGH (ref 98–111)
Chloride: 120 mmol/L — ABNORMAL HIGH (ref 98–111)
Creatinine, Ser: 2.59 mg/dL — ABNORMAL HIGH (ref 0.61–1.24)
Creatinine, Ser: 3.21 mg/dL — ABNORMAL HIGH (ref 0.61–1.24)
GFR calc Af Amer: 23 mL/min — ABNORMAL LOW (ref >60)
GFR calc Af Amer: 30 mL/min — ABNORMAL LOW (ref >60)
GFR calc non Af Amer: 20 mL/min — ABNORMAL LOW (ref >60)
GFR calc non Af Amer: 26 mL/min — ABNORMAL LOW (ref >60)
Glucose, Bld: 186 mg/dL — ABNORMAL HIGH (ref 70–99)
Glucose, Bld: 238 mg/dL — ABNORMAL HIGH (ref 70–99)
Potassium: 4.3 mmol/L (ref 3.5–5.1)
Potassium: 4.3 mmol/L (ref 3.5–5.1)
Sodium: 148 mmol/L — ABNORMAL HIGH (ref 135–145)
Sodium: 152 mmol/L — ABNORMAL HIGH (ref 135–145)

## 2019-09-13 LAB — OCCULT BLOOD GASTRIC / DUODENUM (SPECIMEN CUP): Occult Blood, Gastric: POSITIVE — AB

## 2019-09-13 LAB — CBC
HCT: 46.4 % (ref 39.0–52.0)
Hemoglobin: 14.9 g/dL (ref 13.0–17.0)
MCH: 29.9 pg (ref 26.0–34.0)
MCHC: 32.1 g/dL (ref 30.0–36.0)
MCV: 93.2 fL (ref 80.0–100.0)
Platelets: 138 10*3/uL — ABNORMAL LOW (ref 150–400)
RBC: 4.98 MIL/uL (ref 4.22–5.81)
RDW: 14.6 % (ref 11.5–15.5)
WBC: 9 10*3/uL (ref 4.0–10.5)
nRBC: 0 % (ref 0.0–0.2)

## 2019-09-13 LAB — COMPREHENSIVE METABOLIC PANEL
ALT: 66 U/L — ABNORMAL HIGH (ref 0–44)
AST: 123 U/L — ABNORMAL HIGH (ref 15–41)
Albumin: 3.5 g/dL (ref 3.5–5.0)
Alkaline Phosphatase: 67 U/L (ref 38–126)
Anion gap: 16 — ABNORMAL HIGH (ref 5–15)
BUN: 84 mg/dL — ABNORMAL HIGH (ref 6–20)
CO2: 21 mmol/L — ABNORMAL LOW (ref 22–32)
Calcium: 9 mg/dL (ref 8.9–10.3)
Chloride: 114 mmol/L — ABNORMAL HIGH (ref 98–111)
Creatinine, Ser: 2.99 mg/dL — ABNORMAL HIGH (ref 0.61–1.24)
GFR calc Af Amer: 25 mL/min — ABNORMAL LOW (ref >60)
GFR calc non Af Amer: 22 mL/min — ABNORMAL LOW (ref >60)
Glucose, Bld: 268 mg/dL — ABNORMAL HIGH (ref 70–99)
Potassium: 4.1 mmol/L (ref 3.5–5.1)
Sodium: 151 mmol/L — ABNORMAL HIGH (ref 135–145)
Total Bilirubin: 1.1 mg/dL (ref 0.3–1.2)
Total Protein: 6.9 g/dL (ref 6.5–8.1)

## 2019-09-13 LAB — GLUCOSE, CAPILLARY
Glucose-Capillary: 268 mg/dL — ABNORMAL HIGH (ref 70–99)
Glucose-Capillary: 237 mg/dL — ABNORMAL HIGH (ref 70–99)
Glucose-Capillary: 259 mg/dL — ABNORMAL HIGH (ref 70–99)
Glucose-Capillary: 202 mg/dL — ABNORMAL HIGH (ref 70–99)
Glucose-Capillary: 96 mg/dL (ref 70–99)
Glucose-Capillary: 99 mg/dL (ref 70–99)
Glucose-Capillary: 134 mg/dL — ABNORMAL HIGH (ref 70–99)
Glucose-Capillary: 161 mg/dL — ABNORMAL HIGH (ref 70–99)
Glucose-Capillary: 121 mg/dL — ABNORMAL HIGH (ref 70–99)
Glucose-Capillary: 201 mg/dL — ABNORMAL HIGH (ref 70–99)
Glucose-Capillary: 191 mg/dL — ABNORMAL HIGH (ref 70–99)
Glucose-Capillary: 190 mg/dL — ABNORMAL HIGH (ref 70–99)
Glucose-Capillary: 196 mg/dL — ABNORMAL HIGH (ref 70–99)
Glucose-Capillary: 180 mg/dL — ABNORMAL HIGH (ref 70–99)

## 2019-09-13 LAB — RENAL FUNCTION PANEL
Albumin: 3.1 g/dL — ABNORMAL LOW (ref 3.5–5.0)
Anion gap: 14 (ref 5–15)
BUN: 82 mg/dL — ABNORMAL HIGH (ref 6–20)
CO2: 20 mmol/L — ABNORMAL LOW (ref 22–32)
Calcium: 8.8 mg/dL — ABNORMAL LOW (ref 8.9–10.3)
Chloride: 116 mmol/L — ABNORMAL HIGH (ref 98–111)
Creatinine, Ser: 2.9 mg/dL — ABNORMAL HIGH (ref 0.61–1.24)
GFR calc Af Amer: 26 mL/min — ABNORMAL LOW (ref >60)
GFR calc non Af Amer: 23 mL/min — ABNORMAL LOW (ref >60)
Glucose, Bld: 290 mg/dL — ABNORMAL HIGH (ref 70–99)
Phosphorus: 4.3 mg/dL (ref 2.5–4.6)
Potassium: 3.9 mmol/L (ref 3.5–5.1)
Sodium: 150 mmol/L — ABNORMAL HIGH (ref 135–145)

## 2019-09-13 LAB — BLOOD GAS, ARTERIAL
Acid-base deficit: 6.8 mmol/L — ABNORMAL HIGH (ref 0.0–2.0)
Acid-base deficit: 5.6 mmol/L — ABNORMAL HIGH (ref 0.0–2.0)
Bicarbonate: 19.4 mmol/L — ABNORMAL LOW (ref 20.0–28.0)
Bicarbonate: 18.4 mmol/L — ABNORMAL LOW (ref 20.0–28.0)
FIO2: 50
FIO2: 40
O2 Saturation: 97.8 %
O2 Saturation: 98 %
Patient temperature: 38.2
Patient temperature: 38.2
pCO2 arterial: 30.7 mmHg — ABNORMAL LOW (ref 32.0–48.0)
pCO2 arterial: 32.5 mmHg (ref 32.0–48.0)
pH, Arterial: 7.372 (ref 7.350–7.450)
pH, Arterial: 7.377 (ref 7.350–7.450)
pO2, Arterial: 113 mmHg — ABNORMAL HIGH (ref 83.0–108.0)
pO2, Arterial: 118 mmHg — ABNORMAL HIGH (ref 83.0–108.0)

## 2019-09-13 LAB — HEPATITIS PANEL, ACUTE
HCV Ab: NONREACTIVE
Hep A IgM: NONREACTIVE
Hep B C IgM: NONREACTIVE
Hepatitis B Surface Ag: NONREACTIVE

## 2019-09-13 LAB — HEMOGLOBIN A1C
Hgb A1c MFr Bld: 8.1 % — ABNORMAL HIGH (ref 4.8–5.6)
Mean Plasma Glucose: 185.77 mg/dL

## 2019-09-13 LAB — URINE CULTURE: Culture: NO GROWTH

## 2019-09-13 LAB — PROCALCITONIN: Procalcitonin: 0.33 ng/mL

## 2019-09-13 LAB — PHOSPHORUS: Phosphorus: 4.6 mg/dL (ref 2.5–4.6)

## 2019-09-13 LAB — OSMOLALITY: Osmolality: 381 mOsm/kg (ref 275–295)

## 2019-09-13 LAB — TSH: TSH: 0.538 u[IU]/mL (ref 0.350–4.500)

## 2019-09-13 LAB — HIV ANTIBODY (ROUTINE TESTING W REFLEX): HIV Screen 4th Generation wRfx: NONREACTIVE

## 2019-09-13 LAB — MAGNESIUM: Magnesium: 2.4 mg/dL (ref 1.7–2.4)

## 2019-09-13 LAB — BETA-HYDROXYBUTYRIC ACID: Beta-Hydroxybutyric Acid: 1.05 mmol/L — ABNORMAL HIGH (ref 0.05–0.27)

## 2019-09-13 LAB — MRSA PCR SCREENING: MRSA by PCR: NEGATIVE

## 2019-09-13 MED ORDER — INSULIN REGULAR(HUMAN) IN NACL 100-0.9 UT/100ML-% IV SOLN
INTRAVENOUS | Status: DC
  Administered 2019-09-13: 11:00:00 8.5 [IU]/h via INTRAVENOUS
  Filled 2019-09-13: qty 100

## 2019-09-13 MED ORDER — NYSTATIN 100000 UNIT/ML MT SUSP
5.0000 mL | Freq: Four times a day (QID) | OROMUCOSAL | Status: DC
  Administered 2019-09-13 – 2019-09-18 (×20): 500000 [IU] via ORAL
  Filled 2019-09-13 (×20): qty 5

## 2019-09-13 MED ORDER — INSULIN DETEMIR 100 UNIT/ML ~~LOC~~ SOLN
11.0000 [IU] | Freq: Once | SUBCUTANEOUS | Status: DC
  Filled 2019-09-13: qty 0.11

## 2019-09-13 MED ORDER — CHLORHEXIDINE GLUCONATE 0.12% ORAL RINSE (MEDLINE KIT): 15.0000 mL | Freq: Two times a day (BID) | OROMUCOSAL | Status: DC

## 2019-09-13 MED ORDER — PANTOPRAZOLE SODIUM 40 MG IV SOLR: 40.0000 mg | Freq: Every day | INTRAVENOUS | Status: DC

## 2019-09-13 MED ORDER — SODIUM BICARBONATE 8.4 % IV SOLN
INTRAVENOUS | Status: AC
  Filled 2019-09-13: qty 100

## 2019-09-13 MED ORDER — ORAL CARE MOUTH RINSE
15.0000 mL | OROMUCOSAL | Status: DC
  Administered 2019-09-13 – 2019-09-16 (×30): 15 mL via OROMUCOSAL

## 2019-09-13 MED ORDER — DOCUSATE SODIUM 50 MG/5ML PO LIQD: 100.0000 mg | Freq: Two times a day (BID) | ORAL | Status: DC | PRN

## 2019-09-13 MED ORDER — INSULIN GLARGINE 100 UNIT/ML ~~LOC~~ SOLN
10.0000 [IU] | Freq: Every day | SUBCUTANEOUS | Status: DC
  Filled 2019-09-13: qty 0.1

## 2019-09-13 MED ORDER — DEXTROSE-NACL 5-0.45 % IV SOLN
INTRAVENOUS | Status: DC
  Administered 2019-09-13: 07:00:00 via INTRAVENOUS

## 2019-09-13 MED ORDER — ADULT MULTIVITAMIN W/MINERALS CH
1.0000 | ORAL_TABLET | Freq: Every day | ORAL | Status: DC
  Administered 2019-09-13 – 2019-09-27 (×14): 1
  Filled 2019-09-13 (×14): qty 1

## 2019-09-13 MED ORDER — FENTANYL CITRATE (PF) 100 MCG/2ML IJ SOLN
25.0000 ug | INTRAMUSCULAR | Status: DC | PRN
  Administered 2019-09-15 – 2019-09-23 (×3): 50 ug via INTRAVENOUS
  Filled 2019-09-13 (×5): qty 2

## 2019-09-13 MED ORDER — SODIUM CHLORIDE 0.9 % IV SOLN
10.0000 mg/kg | Freq: Two times a day (BID) | INTRAVENOUS | Status: DC
  Administered 2019-09-13: 09:00:00 930 mg via INTRAVENOUS
  Filled 2019-09-13 (×2): qty 0.93

## 2019-09-13 MED ORDER — POLYETHYLENE GLYCOL 3350 17 G PO PACK
17.0000 g | PACK | Freq: Every day | ORAL | Status: DC
  Administered 2019-09-13 – 2019-09-18 (×6): 17 g via ORAL
  Filled 2019-09-13 (×6): qty 1

## 2019-09-13 MED ORDER — BISACODYL 10 MG RE SUPP: 10.0000 mg | Freq: Every day | RECTAL | Status: DC | PRN

## 2019-09-13 MED ORDER — ORAL CARE MOUTH RINSE: 15.0000 mL | OROMUCOSAL | Status: DC

## 2019-09-13 MED ORDER — INSULIN ASPART 100 UNIT/ML ~~LOC~~ SOLN
0.0000 [IU] | SUBCUTANEOUS | Status: DC
  Administered 2019-09-13: 08:00:00 5 [IU] via SUBCUTANEOUS
  Administered 2019-09-13: 05:00:00 8 [IU] via SUBCUTANEOUS

## 2019-09-13 MED ORDER — DEXMEDETOMIDINE HCL IN NACL 400 MCG/100ML IV SOLN: 0.0000 ug/kg/h | INTRAVENOUS | Status: DC

## 2019-09-13 MED ORDER — PANTOPRAZOLE SODIUM 40 MG IV SOLR
40.0000 mg | Freq: Two times a day (BID) | INTRAVENOUS | Status: DC
  Administered 2019-09-13 – 2019-09-27 (×29): 40 mg via INTRAVENOUS
  Filled 2019-09-13 (×30): qty 40

## 2019-09-13 MED ORDER — DEXTROSE 50 % IV SOLN
0.0000 mL | INTRAVENOUS | Status: DC | PRN
  Administered 2019-09-23: 25 mL via INTRAVENOUS
  Administered 2019-09-24: 09:00:00 50 mL via INTRAVENOUS
  Filled 2019-09-13: qty 50

## 2019-09-13 MED ORDER — SODIUM CHLORIDE 0.9 % IV SOLN: INTRAVENOUS | Status: DC

## 2019-09-13 MED ORDER — INSULIN DETEMIR 100 UNIT/ML ~~LOC~~ SOLN
6.0000 [IU] | Freq: Once | SUBCUTANEOUS | Status: DC
  Filled 2019-09-13: qty 0.06

## 2019-09-13 MED ORDER — CHLORHEXIDINE GLUCONATE 0.12% ORAL RINSE (MEDLINE KIT)
15.0000 mL | Freq: Two times a day (BID) | OROMUCOSAL | Status: DC
  Administered 2019-09-13 – 2019-09-16 (×7): 15 mL via OROMUCOSAL

## 2019-09-13 MED ORDER — DEXTROSE 5 % IV SOLN
INTRAVENOUS | Status: DC
  Administered 2019-09-13: 09:00:00 via INTRAVENOUS

## 2019-09-13 MED ORDER — ACETAMINOPHEN 325 MG PO TABS
650.0000 mg | ORAL_TABLET | Freq: Four times a day (QID) | ORAL | Status: DC | PRN
  Administered 2019-09-13 – 2019-09-14 (×3): 650 mg via ORAL
  Filled 2019-09-13 (×3): qty 2

## 2019-09-13 MED ORDER — DEXTROSE-NACL 5-0.45 % IV SOLN: INTRAVENOUS | Status: DC

## 2019-09-13 MED ORDER — CHLORHEXIDINE GLUCONATE CLOTH 2 % EX PADS
6.0000 | MEDICATED_PAD | Freq: Every day | CUTANEOUS | Status: DC
  Administered 2019-09-13 – 2019-09-28 (×14): 6 via TOPICAL

## 2019-09-13 MED ORDER — THIAMINE HCL 100 MG/ML IJ SOLN
100.0000 mg | Freq: Every day | INTRAMUSCULAR | Status: DC
  Administered 2019-09-13 – 2019-09-27 (×15): 100 mg via INTRAVENOUS
  Filled 2019-09-13 (×15): qty 2

## 2019-09-13 MED ORDER — FREE WATER
150.0000 mL | Status: DC
  Administered 2019-09-13 – 2019-09-14 (×4): 150 mL

## 2019-09-13 MED ORDER — SODIUM BICARBONATE 8.4 % IV SOLN
INTRAVENOUS | Status: AC
  Filled 2019-09-13: qty 50

## 2019-09-13 MED ORDER — HEPARIN SODIUM (PORCINE) 5000 UNIT/ML IJ SOLN
5000.0000 [IU] | Freq: Three times a day (TID) | INTRAMUSCULAR | Status: DC
  Administered 2019-09-13 – 2019-09-16 (×11): 5000 [IU] via SUBCUTANEOUS
  Filled 2019-09-13 (×11): qty 1

## 2019-09-13 NOTE — Consult Note (Signed)
[facility] Renal Consultation Note  Requesting MD: Lenice Llamas, MD   Indication for Consultation: Acute renal failure and metabolic acidosis  Chief complaint: Altered mental status  HPI:  [name] is a 58 y.o. male with a history of alcohol abuse, prior cocaine abuse, schizophrenia, diabetes, hypertension, coronary artery disease who presented to the hospital with altered mental status.  He was transferred from [facility] to [facility]  Note that he was incarcerated for drunken disorderly contact and presented from custody to the hospital.  He had been there 2 days in custody and had been taking PO.  Note that he was also on lisinopril and Metformin.  He does have a history of prior ethylene glycol ingestion.  He has been given fomepizole per poison control recommendations.  Presenting creatinine was 3.81 on 11/25.  Ethylene glycol level not available.  Initially he had a lactic acid of 2.7.  Volatile panel is pending.  Salicylate and acetaminophen within normal limits.  SARS coronavirus 2 test negative.  Repeat lactic acid down to 0.9 yesterday afternoon.  Serum osmolality was sent and not yet available.  Multiple prior suicide attempts per chart review.  Spoke with attending - team is not sure of etiology of temp.  Creatinine has improved to 2.99.  Note that his fluids were transitioned from bicarb to D51/2 normal.  He has been intubated.    Creatinine, Ser  Date/Time Value Ref Range Status  09/13/2019 05:45 AM 2.99 (H) 0.61 - 1.24 mg/dL Final  09/13/2019 12:15 AM 3.21 (H) 0.61 - 1.24 mg/dL Final  09/12/2019 09:10 PM 3.32 (H) 0.61 - 1.24 mg/dL Final  09/12/2019 01:16 PM 3.81 (H) 0.61 - 1.24 mg/dL Final  07/14/2019 04:52 PM 1.25 (H) 0.61 - 1.24 mg/dL Final  07/01/2019 06:01 PM 1.16 0.61 - 1.24 mg/dL Final  06/15/2019 11:26 AM 1.28 (H) 0.61 - 1.24 mg/dL Final  06/06/2019 06:38 AM 1.17 0.61 - 1.24 mg/dL Final  05/31/2019 03:08 AM 1.20 0.61 - 1.24 mg/dL Final  05/30/2019  01:01 PM 1.42 (H) 0.61 - 1.24 mg/dL Final  05/30/2019 07:18 AM 1.27 (H) 0.61 - 1.24 mg/dL Final  05/29/2019 08:54 PM 1.34 (H) 0.61 - 1.24 mg/dL Final  05/29/2019 04:54 PM 1.29 (H) 0.61 - 1.24 mg/dL Final  05/26/2019 06:28 PM 1.16 0.61 - 1.24 mg/dL Final  05/02/2019 03:26 PM 1.26 (H) 0.61 - 1.24 mg/dL Final  04/14/2018 06:19 AM 1.06 0.61 - 1.24 mg/dL Final  04/13/2018 10:29 PM 1.25 (H) 0.61 - 1.24 mg/dL Final  01/02/2018 09:54 PM 0.90 0.61 - 1.24 mg/dL Final  01/02/2018 04:30 PM 1.05 0.61 - 1.24 mg/dL Final  12/28/2017 08:52 AM 1.04 0.61 - 1.24 mg/dL Final  12/27/2017 09:13 PM 0.94 0.61 - 1.24 mg/dL Final  11/11/2017 07:45 PM 1.25 (H) 0.61 - 1.24 mg/dL Final  10/22/2017 04:46 PM 1.29 (H) 0.61 - 1.24 mg/dL Final  10/11/2017 02:11 PM 1.20 0.61 - 1.24 mg/dL Final  07/01/2017 10:49 AM 0.99 0.61 - 1.24 mg/dL Final  06/15/2017 01:31 AM 1.14 0.61 - 1.24 mg/dL Final  06/14/2017 10:06 PM 1.33 (H) 0.61 - 1.24 mg/dL Final  05/14/2017 05:33 PM 1.02 0.61 - 1.24 mg/dL Final  04/26/2017 03:14 PM 0.91 0.61 - 1.24 mg/dL Final  12/23/2016 07:57 PM 1.80 (H) 0.61 - 1.24 mg/dL Final  11/11/2016 10:07 AM 1.06 0.76 - 1.27 mg/dL Final  12/09/2015 11:45 PM 1.52 (H) 0.61 - 1.24 mg/dL Final  12/09/2015 07:57 PM 1.72 (H) 0.61 - 1.24 mg/dL Final  09/12/2015  05:26 PM 1.61 (H) 0.61 - 1.24 mg/dL Final  08/31/2015 06:25 PM 1.29 (H) 0.61 - 1.24 mg/dL Final  07/30/2015 05:11 AM 1.04 0.61 - 1.24 mg/dL Final  07/29/2015 11:00 AM 1.05 0.61 - 1.24 mg/dL Final  07/29/2015 10:09 AM 1.00 0.61 - 1.24 mg/dL Final  07/28/2015 11:28 PM 1.01 0.61 - 1.24 mg/dL Final  07/28/2015 10:47 AM 0.98 0.61 - 1.24 mg/dL Final  07/28/2015 03:05 AM 0.88 0.61 - 1.24 mg/dL Final  07/27/2015 11:17 PM 0.73 0.61 - 1.24 mg/dL Final  07/27/2015 01:13 PM 0.98 0.61 - 1.24 mg/dL Final  07/27/2015 03:24 AM 1.15 0.61 - 1.24 mg/dL Final  07/26/2015 09:34 PM 1.10 0.61 - 1.24 mg/dL Final  07/26/2015 08:50 PM 1.07 0.61 - 1.24 mg/dL Final  11/18/2008 04:36  AM 0.77 0.4 - 1.5 mg/dL Final  11/17/2008 02:50 PM 0.98 0.4 - 1.5 mg/dL Final  11/17/2008 07:22 AM 0.92 0.4 - 1.5 mg/dL Final  11/17/2008 03:45 AM 0.91 0.4 - 1.5 mg/dL Final  11/16/2008 10:30 PM 0.97 0.4 - 1.5 mg/dL Final  11/16/2008 06:47 PM 0.85 0.4 - 1.5 mg/dL Final     PMHx:   Past Medical History:  Diagnosis Date  . Alcohol dependence ([place]) 1979   stated abusing ETOH at age 23   . Back pain   . Benzodiazepine dependence ([place])   . Cardiac arrest ([place])   . Chronic back pain   . Cocaine abuse ([place])   . COPD (chronic obstructive pulmonary disease) ([place])   . Diabetes mellitus without complication ([place]) [id number]  . Hypertension 2008  . MI (myocardial infarction) ([place])   . Non-compliance   . Schizoaffective disorder ([place]) 2006   . Substance abuse [facility])     Past Surgical History:  Procedure Laterality Date  . CYST EXCISION      Family Hx:  Family History  Problem Relation Age of Onset  . Heart disease Mother   . Heart disease Father   . Heart disease Maternal Grandmother   . Diabetes Maternal Grandmother     Social History:  reports that he has been smoking cigarettes. He has been smoking about 1.50 packs per day. He has never used smokeless tobacco. He reports current alcohol use. He reports previous drug use. Drug: Cocaine.  Allergies:  Allergies  Allergen Reactions  . Quetiapine Anaphylaxis  . Seroquel [Quetiapine Fumarate] Anxiety and Other (See Comments)    Reaction:  Nightmares   . Trazodone Anaphylaxis  . Trazodone And Nefazodone Anxiety and Other (See Comments)    Other reaction(s): Unknown Nightmares Reaction:  Nightmares   . Vistaril [Hydroxyzine Hcl] Nausea And Vomiting  . Haldol [Haloperidol Lactate] Anxiety and Other (See Comments)    Reaction:  Nightmares     Medications: Prior to Admission medications   Medication Sig Start Date End Date Taking? Authorizing Provider  aspirin EC 81 MG tablet Take 81 mg by mouth daily.   Yes [provider]  atorvastatin (LIPITOR) 40 MG tablet Take 40 mg by mouth daily.   Yes [provider]  baclofen (LIORESAL) 10 MG tablet Take 10 mg by mouth 3 (three) times daily as needed for muscle spasms.   Yes [provider]  budesonide-formoterol (SYMBICORT) 80-4.5 MCG/ACT inhaler Inhale 2 puffs into the lungs 2 (two) times daily.   Yes [provider]  cyclobenzaprine (FLEXERIL) 10 MG tablet Take 10 mg by mouth 3 (three) times daily as needed for muscle spasms.   Yes [provider]  hydrOXYzine (VISTARIL) 50  MG capsule Take 50 mg by mouth 3 (three) times daily.   Yes [provider]  insulin aspart (NOVOLOG) 100 UNIT/ML injection Inject 1-30 Units into the skin 3 (three) times daily with meals. SLIDING SCALE 02/12/19  Yes [provider]  Insulin Glargine (BASAGLAR KWIKPEN) 100 UNIT/ML SOPN Inject 20 Units into the skin at bedtime.   Yes [provider]  isosorbide mononitrate (IMDUR) 30 MG 24 hr tablet Take 30 mg by mouth daily.   Yes [provider]  LEVEMIR 100 UNIT/ML injection Inject 20 Units into the skin at bedtime.  04/26/19  Yes [provider]  lisinopril (PRINIVIL,ZESTRIL) 10 MG tablet Take 1 tablet by mouth daily. 12/12/17  Yes [provider]  metFORMIN (GLUCOPHAGE-XR) 500 MG 24 hr tablet Take 2 tablets (1,000 mg total) by mouth 2 (two) times daily with a meal. For diabetes management 06/02/19  Yes Aline August, MD  metoprolol succinate (TOPROL-XL) 25 MG 24 hr tablet Take 25 mg by mouth 2 (two) times daily.    Yes [provider]  risperiDONE (RISPERDAL) 2 MG tablet Take 2 mg by mouth at bedtime. [place] ON STARTING ON 09/05/2019   Yes [provider]    I have reviewed the patient's reported and current medications.  Labs:  BMP Latest Ref Rng & Units 09/13/2019 09/13/2019 09/12/2019  Glucose 70 - 99 mg/dL 268(H) 238(H) 265(H)  BUN 6 - 20 mg/dL 84(H) 95(H) 98(H)   Creatinine 0.61 - 1.24 mg/dL 2.99(H) 3.21(H) 3.32(H)  BUN/Creat Ratio 9 - 20 - - -  Sodium 135 - 145 mmol/L 151(H) 148(H) 147(H)  Potassium 3.5 - 5.1 mmol/L 4.1 4.3 4.4  Chloride 98 - 111 mmol/L 114(H) 120(H) 120(H)  CO2 22 - 32 mmol/L 21(L) 15(L) 15(L)  Calcium 8.9 - 10.3 mg/dL 9.0 8.3(L) 8.2(L)    Urinalysis    Component Value Date/Time   COLORURINE YELLOW 09/12/2019 1325   APPEARANCEUR CLEAR 09/12/2019 1325   LABSPEC 1.018 09/12/2019 1325   PHURINE 5.0 09/12/2019 1325   GLUCOSEU NEGATIVE 09/12/2019 1325   HGBUR NEGATIVE 09/12/2019 1325   BILIRUBINUR NEGATIVE 09/12/2019 1325   BILIRUBINUR neg 01/02/2016 1526   KETONESUR 20 (A) 09/12/2019 1325   PROTEINUR 100 (A) 09/12/2019 1325   UROBILINOGEN 0.2 01/02/2016 1526   UROBILINOGEN 0.2 07/27/2015 0322   NITRITE NEGATIVE 09/12/2019 1325   LEUKOCYTESUR NEGATIVE 09/12/2019 1325     ROS:  Unable to obtain secondary to intubated  Physical Exam: Vitals:   09/13/19 0700 09/13/19 0800  BP: 117/83 119/79  Pulse:  (!) 121  Resp: 20 (!) 24  Temp: (!) 102.9 F (39.4 C) (!) 103.1 F (39.5 C)  SpO2:  100%     General:  Adult male in bed intubated  HEENT: NCAT  Neck: trachea midline; no JVD Heart: tachycardic no rub  Lungs: coarse mechanical breath sounds Abdomen: softly dist/obese habitus; normal bowel sounds Extremities: no  Edema appreciated Skin: dry with skin lesions on legs  Neuro:  No agitation on exam; no sedation; opens eyes to sternal rub GU foley in place   Assessment/Plan:  # AKI -With recent insults of limited p.o. intake as well as reported lisinopril use in the setting of Metformin use -Continue supportive care for now - Would not resume Metformin on discharge - renal panel repeat at [id number]  # Acute metabolic acidosis - Does not appear consistent with ethylene glycol at this time given that he presented from custody.  Note history of Metformin  use and concurrent AKI - Continue supportive measures for  now - Fomepizole per poison control - Send serum osm and ethylene glycol level - it appears ethylene glycol was to sent STAT to [facility] lab per charting - discussed with team    - Would not resume Metformin on discharge - s/p bicarbonate overnight with a pH of 7.377 on last check (7.35 pH is cutoff for stopping bicarb infusion) - renal panel at 11am  # HTN  - off of anti-hypertensives - BP low 100's on my exam   # Polysubstance abuse - Would need counseling to be offered when extubated and interacting  # Hypernatremia - Note he was transitioned to D5 1/2 normal saline - Would transition to D5  # Acute hypoxic resp failure  - on mech ventilation per pulm  Claudia Desanctis 09/13/2019, 8:30 AM

## 2019-09-13 NOTE — Progress Notes (Signed)
eLink Physician-Brief Progress Note Patient Name: [name] DOB: [birth year] MRN: [id number]   Date of Service  09/13/2019  HPI/Events of Note  Pt transitioned off insulin infusion to Levemir  11 units SQ daily. There's concern that he could become hypoglycemic due to NPO status.  eICU Interventions  Levemir reduced to 6 units with sliding scale insulin coverage, dose can be increased as needed depending on blood sugar levels.        [name] Ogan 09/13/2019, 11:30 PM

## 2019-09-13 NOTE — Progress Notes (Signed)
Spoke with [facility] lab, volatile and ethylene glycol panel was sent to [facility] with turn around time of 3-4 days normally, not including holidays.    Only 3 labs in [place] will run these test with quick turn around per poison control.  Will resend now, STAT to [facility] lab  Will be royal blue top tube  [facility] lab [phone]  -spoke with [facility] lab concerning this.      Kennieth Rad, MSN, AGACNP-BC [facility] 09/13/2019, 6:51 AM

## 2019-09-13 NOTE — Progress Notes (Signed)
Pt's Acetone levels were 36 times 2 . Acetone levels reported to Dr. Lorraine Lax.

## 2019-09-13 NOTE — Plan of Care (Signed)
  Problem: Clinical Measurements: Goal: Ability to maintain clinical measurements within normal limits will improve Outcome: Progressing Goal: Diagnostic test results will improve Outcome: Progressing Goal: Respiratory complications will improve Outcome: Progressing   Problem: Education: Goal: Knowledge of General Education information will improve Description: Including pain rating scale, medication(s)/side effects and non-pharmacologic comfort measures Outcome: Not Progressing   Problem: Health Behavior/Discharge Planning: Goal: Ability to manage health-related needs will improve Outcome: Not Progressing   Problem: Activity: Goal: Risk for activity intolerance will decrease Outcome: Not Progressing   Problem: Nutrition: Goal: Adequate nutrition will be maintained Outcome: Not Progressing   Problem: Education: Goal: Knowledge of General Education information will improve Description: Including pain rating scale, medication(s)/side effects and non-pharmacologic comfort measures Outcome: Not Progressing   Problem: Health Behavior/Discharge Planning: Goal: Ability to manage health-related needs will improve Outcome: Not Progressing   Problem: Activity: Goal: Risk for activity intolerance will decrease Outcome: Not Progressing   Problem: Nutrition: Goal: Adequate nutrition will be maintained Outcome: Not Progressing

## 2019-09-13 NOTE — Progress Notes (Signed)
Sputum sent to lab

## 2019-09-13 NOTE — H&P (Signed)
NAME:  [name], MRN:  [id number], DOB:  10/04/1961, LOS: 1 ADMISSION DATE:  09/12/2019, CONSULTATION DATE:  09/12/2019 REFERRING MD:  Dr. Stark Jock, CHIEF COMPLAINT:  AMS  Brief History   39 yoM with hx of schizoaffective disorder, polysubstance abuse, and previous ethylene glycol ingestions presenting obtunded after 2 day incarceration where he did not eat/ drink/ or ambulate.  Found to be hypotensive, with AKI, high AGMA.  Given concern for ethylene glycol ingestion he was started on fomepizole.  Required intubation for airway protection while in ER.  To be transferred to [facility] for higher level of care and nephrology.   History of present illness   HPI obtained from medical chart review as patient is currently intubated on mechanical ventilation.  58 year old male with past medical history of schizoaffective disorder, polysubstance abuse, EtOH abuse, COPD, diabetes, hypertension, previous MI, ethylene glycol ingestions presenting from jail after being found obtunded.  Patient was incarcerated 2 days prior for reported drinking and disorderly behavior.  Since, he is not a drinker been able to ambulate.  He was found obtunded and tachypneic and therefore presented to the ER.  In ER was initially obtunded, tachypneic, hypoxic, hypotensive and tachycardic.  Given suspicion of sepsis he started on cefepime and flagyl, and fluid resuscitated.  Labs noted for metabolic acidosis with severe anion gap greater than 20, AKI, hyponatremia, EtOH negative, lactate initially 2.7 which cleared to 0.9, WBC 14.8, CT of head negative, UA noted for 20 ketones, 100 protein, no urine oxalate crystals noted,UDS, Tylenol,  Salicylate, and EtOH levels negative.  Given concern for possible ethylene glycol ingestion he was treated folic acid, thiamine, pyridoxine, fomepizole, with his mental status initially improved with IV fluids however he became obtunded requiring intubation for airway protection in the ER.  Covid  negative. CXR neg.  Repeat labs showing closure of anion gap, improvement in acidosis while on bicarb gtt, with pending volatile panel, ethylene glycol level, and serum osmolality.  Nephrology to see and patient to be transferred to [facility] for high lever of care, PCCM accepting.   Past Medical History  schizoaffective disorder, polysubstance abuse, EtOH abuse, COPD, diabetes, hypertension, previous MI, ethylene glycol ingestions   Significant Hospital Events   11/25 present to [facility] 11/26 tx [facility]  Consults:   Procedures:  11/25 ETT >>  Significant Diagnostic Tests:  11/26 CTH >> No acute abnormality is noted. The overall appearance is stable from the prior exam.  Micro Data:  11/25 BC x 2 >> 11/25 UC >> 11/25 SARS CoV2 >>  Antimicrobials:  11/25 cefepime 11/25 flagyl  11/25 vanc   Interim history/subjective:  Arrived on bicarb gtt at 100 ml/hr and propofol at 10 mcg/min  Objective   Blood pressure 131/84, pulse (!) 110, temperature (!) 100.8 F (38.2 C), resp. rate 18, weight 98.9 kg, SpO2 100 %.    Vent Mode: PRVC FiO2 (%):  [50 %-100 %] 50 % Set Rate:  [16 bmp-18 bmp] 16 bmp Vt Set:  [520 mL-600 mL] 600 mL PEEP:  [5 cmH20] 5 cmH20 Plateau Pressure:  [17 cmH20-18 cmH20] 18 cmH20   Intake/Output Summary (Last 24 hours) at 09/13/2019 0419 Last data filed at 09/13/2019 0239 Gross per 24 hour  Intake 4225.18 ml  Output 1675 ml  Net 2550.18 ml   Filed Weights   09/12/19 1400  Weight: 98.9 kg   Examination:  On propofol  General:  Chronically ill appearing older than apparent age  69: MM pink/moist, pupils 4/reactive, anicteric, no  JVD, very poor dentition, he has front upper tooth which is loose- will need to watch, do not think at this time it is too loose where it should be pulled for prevention of aspiration, oral thrush, ETT 7.5 at 25 at lip, OGT- appears possibly bloody Neuro: grimaces, minor flexion in upper extremities to noxious, not f/c, no movement in LE  CV: rr, no murmur PULM:  Slightly breathing above MV rate, slightly coarse in RUL otherwise clear, no wheeze GI: soft, bs+, foley with cyu Extremities: cool/dry, no LE edema, chronic venous stasis LE Skin: scaly circular rash to legs and lower abd, old bruising to LEs  Resolved Hospital Problem list    Assessment & Plan:   Acute encephalopathy of unclear etiology- ddx toxic / metabolic/ uremia vs less likely infectious vs CNS process Unclear of possible ethylene glycol ingestion - CTH neg, UDS neg, ammonia normal, neg ASA/ salicylate  P:  Trend neuro exams Will need psych consult/ safety sitter in the event of possible SI given prior hx   AGMA- resolving AKI / Uremia - mild improvement with good UOP - unclear if acute renal failure is related to dehydration/ hypovolemia vs possible ethylene glycol ingestion, patient would be 3 days out since he has been incarcerated for 2 days with reported no PO intake  - initial lactic of 2.7 since cleared  P:  Pending volatile panel, ethylene glycol level and serum osmolality Nephrology in am Spoke with poison control who recommends continuing empiric fomepizole 10mg /kg q 12 hr, folic acid 50 mg q 6 hrs, thiamine, and pyridoxine till volatile / ethylene glycol levels back/ ruled out  Foley Strict I/O's Renal panel/ mag now and trend    Acute respiratory insufficiency related to acute encephalopathy  Hx COPD P:  Full MV support, PRVC 8 cc/ kg, rate  CXR and ABG now VAP bundle  duonebs prn  Daily SBT / WUA PAD Protocol with prn's add precedex if needed w/ bowel regimen.  D/c propofol, RASS goal 0/-1   SIRS / leukocytosis  P:  Check PCT if neg, d/c empiric abx  CXR has been clear, UA neg, COVID neg Follow culture data CBC now   Hx schizoaffective disorder P:  Hold risperidone, unclear if taking  Will need psych consult as above   Hx CAD/ HLD/ HTN P:  Continue ASA, lipitor Hold imudur, lisinopril, and metoprolol for now    Diabetes / hyperglycemic  ? DKA, AG now closed therefore no insulin gtt  P:  CBG q 4, SSI mod Check beta hydroxybutyric acid    Oral thrush P:  Oral nystatin   Best practice:  Diet: NPO Pain/Anxiety/Delirium protocol (if indicated): precedex/ prn fentanyl VAP protocol (if indicated): yes DVT prophylaxis: SCDs, heparin SQ GI prophylaxis: PPI Glucose control: SSI Mobility: BR Code Status: full  Family Communication: pending Disposition: ICU  Labs   CBC: Recent Labs  Lab 09/12/19 1316  WBC 14.8*  NEUTROABS 12.2*  HGB 16.9  HCT 53.5*  MCV 95.5  PLT [id number]    Basic Metabolic Panel: Recent Labs  Lab 09/12/19 1316 09/12/19 2110 09/13/19 0015  NA 152* 147* 148*  K 4.8 4.4 4.3  CL 116* 120* 120*  CO2 15* 15* 15*  GLUCOSE 310* 265* 238*  BUN 100* 98* 95*  CREATININE 3.81* 3.32* 3.21*  CALCIUM 9.8 8.2* 8.3*  PHOS  --  6.0*  --    GFR: Estimated Creatinine Clearance: 29.7 mL/min (A) (by C-G formula based on SCr of 3.21  mg/dL (H)). Recent Labs  Lab 09/12/19 1316 09/12/19 1516  WBC 14.8*  --   LATICACIDVEN 2.7* 0.9    Liver Function Tests: Recent Labs  Lab 09/12/19 1316 09/12/19 2110  AST 104*  --   ALT 65*  --   ALKPHOS 78  --   BILITOT 1.5*  --   PROT 8.5*  --   ALBUMIN 4.6 3.4*   No results for input(s): LIPASE, AMYLASE in the last 168 hours. Recent Labs  Lab 09/12/19 1325  AMMONIA 29    ABG    Component Value Date/Time   PHART 7.372 09/13/2019 0303   PCO2ART 30.7 (L) 09/13/2019 0303   PO2ART 113 (H) 09/13/2019 0303   HCO3 19.4 (L) 09/13/2019 0303   TCO2 19 (L) 05/30/2019 0535   ACIDBASEDEF 6.8 (H) 09/13/2019 0303   O2SAT 97.8 09/13/2019 0303     Coagulation Profile: Recent Labs  Lab 09/12/19 1316  INR 1.1    Cardiac Enzymes: No results for input(s): CKTOTAL, CKMB, CKMBINDEX, TROPONINI in the last 168 hours.  HbA1C: HB A1C (BAYER DCA - WAIVED)  Date/Time Value Ref Range Status  11/11/2016 11:32 AM 7.6 (H) <7.0 % Final     Comment:                                          Diabetic Adult            <7.0                                       Healthy Adult        4.3 - 5.7                                                           (DCCT/NGSP) American Diabetes Association's Summary of Glycemic Recommendations for Adults with Diabetes: Hemoglobin A1c <7.0%. More stringent glycemic goals (A1c <6.0%) may further reduce complications at the cost of increased risk of hypoglycemia.    Hgb A1c MFr Bld  Date/Time Value Ref Range Status  05/03/2019 09:40 AM 8.8 (H) 4.8 - 5.6 % Final    Comment:    (NOTE) Pre diabetes:          5.7%-6.4% Diabetes:              >6.4% Glycemic control for   <7.0% adults with diabetes   04/13/2018 10:29 PM 10.7 (H) 4.8 - 5.6 % Final    Comment:    (NOTE) Pre diabetes:          5.7%-6.4% Diabetes:              >6.4% Glycemic control for   <7.0% adults with diabetes     CBG: Recent Labs  Lab 09/12/19 1333  GLUCAP 292*    Review of Systems:   Unable   Past Medical History  He,  has a past medical history of Alcohol dependence ([place]) (1979), Back pain, Benzodiazepine dependence ([place]), Cardiac arrest ([place]), Chronic back pain, Cocaine abuse ([place]), COPD (chronic obstructive pulmonary disease) ([place]), Diabetes mellitus without complication ([place]) ([id number]), Hypertension (2008), MI (myocardial infarction) (  [place]), Non-compliance, Schizoaffective disorder ([place]) (2006 ), and Substance abuse ([place]).   Surgical History    Past Surgical History:  Procedure Laterality Date  . CYST EXCISION       Social History   reports that he has been smoking cigarettes. He has been smoking about 1.50 packs per day. He has never used smokeless tobacco. He reports current alcohol use. He reports previous drug use. Drug: Cocaine.   Family History   His family history includes Diabetes in his maternal grandmother; Heart disease in his father, maternal grandmother, and mother.   Allergies Allergies   Allergen Reactions  . Quetiapine Anaphylaxis  . Seroquel [Quetiapine Fumarate] Anxiety and Other (See Comments)    Reaction:  Nightmares   . Trazodone Anaphylaxis  . Trazodone And Nefazodone Anxiety and Other (See Comments)    Other reaction(s): Unknown Nightmares Reaction:  Nightmares   . Vistaril [Hydroxyzine Hcl] Nausea And Vomiting  . Haldol [Haloperidol Lactate] Anxiety and Other (See Comments)    Reaction:  Nightmares      Home Medications  Prior to Admission medications   Medication Sig Start Date End Date Taking? Authorizing Provider  aspirin EC 81 MG tablet Take 81 mg by mouth daily.   Yes [provider]  atorvastatin (LIPITOR) 40 MG tablet Take 40 mg by mouth daily.   Yes [provider]  baclofen (LIORESAL) 10 MG tablet Take 10 mg by mouth 3 (three) times daily as needed for muscle spasms.   Yes [provider]  budesonide-formoterol (SYMBICORT) 80-4.5 MCG/ACT inhaler Inhale 2 puffs into the lungs 2 (two) times daily.   Yes [provider]  cyclobenzaprine (FLEXERIL) 10 MG tablet Take 10 mg by mouth 3 (three) times daily as needed for muscle spasms.   Yes [provider]  hydrOXYzine (VISTARIL) 50 MG capsule Take 50 mg by mouth 3 (three) times daily.   Yes [provider]  insulin aspart (NOVOLOG) 100 UNIT/ML injection Inject 1-30 Units into the skin 3 (three) times daily with meals. SLIDING SCALE 02/12/19  Yes [provider]  Insulin Glargine (BASAGLAR KWIKPEN) 100 UNIT/ML SOPN Inject 20 Units into the skin at bedtime.   Yes [provider]  isosorbide mononitrate (IMDUR) 30 MG 24 hr tablet Take 30 mg by mouth daily.   Yes [provider]  LEVEMIR 100 UNIT/ML injection Inject 20 Units into the skin at bedtime.  04/26/19  Yes [provider]  lisinopril (PRINIVIL,ZESTRIL) 10 MG tablet Take 1 tablet by mouth daily. 12/12/17  Yes [provider]  metFORMIN (GLUCOPHAGE-XR) 500 MG 24  hr tablet Take 2 tablets (1,000 mg total) by mouth 2 (two) times daily with a meal. For diabetes management 06/02/19  Yes Aline August, MD  metoprolol succinate (TOPROL-XL) 25 MG 24 hr tablet Take 25 mg by mouth 2 (two) times daily.    Yes [provider]  risperiDONE (RISPERDAL) 2 MG tablet Take 2 mg by mouth at bedtime. [place] ON STARTING ON 09/05/2019   Yes [provider]     Critical care time: 60 mins     Kennieth Rad, MSN, AGACNP-BC [facility] 09/13/2019, 5:28 AM       '

## 2019-09-13 NOTE — Progress Notes (Addendum)
NAME:  [name], MRN:  [id number], DOB:  August 17, 1961, LOS: 1 ADMISSION DATE:  09/12/2019, CONSULTATION DATE: 11/25 REFERRING MD:  Spero Geralds, MD CHIEF COMPLAINT: Toxic ingestion, altered mental status  Brief History   [name] is a 58 year old gentleman with a history of alcohol abuse disorder, schizophrenia, diabetes who presented from jail for drunk and disorderly conduct and presented to [facility] emergency department on 11/25.  In jail he was found to be obtunded and tachypneic and was therefore brought to the ED. given his history of ethylene glycol toxicity, multiple suicide attempts, and labs consistent with metabolic acidosis and acute kidney injury, he was treated empirically as ethylene glycol toxicity.  He was intubated in [facility] ED last night, and is down to 61M ICU.  Consults:  nephrology  Micro Data:  11/25, Covid negative 11/26 sputum culture rare gram-positive cocci 11/26 MRSA PCR negative  Antimicrobials:  1 dose of metronidazole, currently on vancomycin and cefepime  Interim history/subjective:  Overnight febrile to 102.  Opens eyes to sternal rub.  Hemodynamically stable. OG tube with dark output. Stopped precedex due to decreased mental status. UOP is adequate.  D5 half-normal running at 125.   Objective   Blood pressure 119/79, pulse (!) 121, temperature (!) 103.1 F (39.5 C), resp. rate (!) 24, height 6' (1.829 m), weight 92.7 kg, SpO2 100 %.    Vent Mode: PSV;CPAP FiO2 (%):  [40 %-100 %] 40 % Set Rate:  [16 bmp-18 bmp] 16 bmp Vt Set:  [520 mL-600 mL] 600 mL PEEP:  [5 cmH20] 5 cmH20 Pressure Support:  [10 cmH20] 10 cmH20 Plateau Pressure:  [12 cmH20-18 cmH20] 12 cmH20   Intake/Output Summary (Last 24 hours) at 09/13/2019 0830 Last data filed at 09/13/2019 0600 Gross per 24 hour  Intake 4325.18 ml  Output 1975 ml  Net 2350.18 ml   Filed Weights   09/12/19 1400 09/13/19 0500  Weight: 98.9 kg 92.7 kg    Examination: General:  Intubated, no spontaneous movements, no distress HENT: No conjunctival injection, no scleral icterus Lungs: Diminished bilaterally, no wheezes or crackles, symmetric chest wall expansion Cardiovascular: Tachycardic, regular, no murmurs rubs or gallops Abdomen: Bowel sounds are present, no tenderness to palpation, no distention no palpable masses Extremities: No edema, multiple abrasions on the bilateral lower extremities Neuro: RASS -4, awakens eyes to sternal rub MSK: No acute synovitis Lines: No central lines, peripheral IV, OG tube, endotracheal tube, condom catheter  Assessment & Plan:  History [name] is a 58 year old gentleman who presents with:  Toxic metabolic encephalopathy Possible toxic ingestion -concern for ethylene glycol toxicity Anion gap metabolic acidosis Hyperglycemia with type 2 diabetes mellitus Fevers and severe sepsis Acute kidney injury  The patient was treated empirically for ethylene glycol toxicity, with fomepizole, bicarbonate drip, close monitoring of electrolytes.  Volatile alcohol level was sent and pending, urine osmolality is pending.  I discussed his case with Dr. Royce Macadamia nephrology, the plan is to switch him from D5 half-normal to D5 infusion.  I will also start an insulin drip given his persistent hyperglycemia.  He is currently on empiric vancomycin and cefepime given his fevers, although certainly this could be related to alcohol withdrawal symptoms, versus ethylene glycol toxicity which can also present with fevers.  KUB was ordered this morning to confirm OG placement.  As long as there is no ileus he is okay for oral intake.  The dark output is likely old blood either from trauma or retching.  Discussed  with pharmacy that we will stop fomepizole now as his acid base imbalance is improved substantially.   Best practice:  Diet: NPO Pain/Anxiety/Delirium protocol (if indicated): no sedation VAP protocol (if indicated): Yes DVT prophylaxis: heparin GI  prophylaxis: PPI Glucose control: Elevated BG Foley: Foley - will switch to condom Mobility: bed rest Code Status: full code Family Communication: will communicate today.  Disposition: remains in ICU   Labs   CBC: Recent Labs  Lab 09/12/19 1316 09/13/19 0545  WBC 14.8* 9.0  NEUTROABS 12.2*  --   HGB 16.9 14.9  HCT 53.5* 46.4  MCV 95.5 93.2  PLT 224 138*    Basic Metabolic Panel: Recent Labs  Lab 09/12/19 1316 09/12/19 2110 09/13/19 0015 09/13/19 0545  NA 152* 147* 148* 151*  K 4.8 4.4 4.3 4.1  CL 116* 120* 120* 114*  CO2 15* 15* 15* 21*  GLUCOSE 310* 265* 238* 268*  BUN 100* 98* 95* 84*  CREATININE 3.81* 3.32* 3.21* 2.99*  CALCIUM 9.8 8.2* 8.3* 9.0  MG  --   --   --  2.4  PHOS  --  6.0*  --  4.6   GFR: Estimated Creatinine Clearance: 29.2 mL/min (A) (by C-G formula based on SCr of 2.99 mg/dL (H)). Recent Labs  Lab 09/12/19 1316 09/12/19 1516 09/13/19 0545  WBC 14.8*  --  9.0  LATICACIDVEN 2.7* 0.9  --     Liver Function Tests: Recent Labs  Lab 09/12/19 1316 09/12/19 2110 09/13/19 0545  AST 104*  --  123*  ALT 65*  --  66*  ALKPHOS 78  --  67  BILITOT 1.5*  --  1.1  PROT 8.5*  --  6.9  ALBUMIN 4.6 3.4* 3.5   No results for input(s): LIPASE, AMYLASE in the last 168 hours. Recent Labs  Lab 09/12/19 1325  AMMONIA 29    ABG    Component Value Date/Time   PHART 7.377 09/13/2019 0559   PCO2ART 32.5 09/13/2019 0559   PO2ART 118 (H) 09/13/2019 0559   HCO3 18.4 (L) 09/13/2019 0559   TCO2 19 (L) 05/30/2019 0535   ACIDBASEDEF 5.6 (H) 09/13/2019 0559   O2SAT 98.0 09/13/2019 0559     Coagulation Profile: Recent Labs  Lab 09/12/19 1316  INR 1.1    Cardiac Enzymes: No results for input(s): CKTOTAL, CKMB, CKMBINDEX, TROPONINI in the last 168 hours.  HbA1C: HB A1C (BAYER DCA - WAIVED)  Date/Time Value Ref Range Status  11/11/2016 11:32 AM 7.6 (H) <7.0 % Final    Comment:                                          Diabetic Adult             <7.0                                       Healthy Adult        4.3 - 5.7                                                           (DCCT/NGSP) American  Diabetes Association's Summary of Glycemic Recommendations for Adults with Diabetes: Hemoglobin A1c <7.0%. More stringent glycemic goals (A1c <6.0%) may further reduce complications at the cost of increased risk of hypoglycemia.    Hgb A1c MFr Bld  Date/Time Value Ref Range Status  09/13/2019 05:45 AM 8.1 (H) 4.8 - 5.6 % Final    Comment:    (NOTE) Pre diabetes:          5.7%-6.4% Diabetes:              >6.4% Glycemic control for   <7.0% adults with diabetes   05/03/2019 09:40 AM 8.8 (H) 4.8 - 5.6 % Final    Comment:    (NOTE) Pre diabetes:          5.7%-6.4% Diabetes:              >6.4% Glycemic control for   <7.0% adults with diabetes     CBG: Recent Labs  Lab 09/12/19 1333 09/13/19 0521 09/13/19 0741  GLUCAP 292* 268* 237*    Critical care time:   The patient is critically ill with multiple organ systems failure and requires high complexity decision making for assessment and support, frequent evaluation and titration of therapies, application of advanced monitoring technologies and extensive interpretation of multiple databases.   Critical Care Time devoted to patient care services described in this note is 45 minutes. This time reflects the time of my personal involvement. This critical care time does not reflect separately billable procedures or procedure time, teaching time or supervisory time of PA/NP/Med student/Med Resident etc but could involve care discussion time.  [facility] 09/13/2019 8:30 AM  Pager: [phone] After hours pager: [phone]

## 2019-09-13 NOTE — Progress Notes (Signed)
eLink Physician-Brief Progress Note Patient Name: [name] DOB: [birth year] MRN: [id number]   Date of Service  09/13/2019  HPI/Events of Note  Pt transferred to [facility] from outside hospital with acute respiratory failure, possible toxic ingestion of ethylene glycol, altered mental status . He has a history of schizoaffective disorder and ETOH abuse.  eICU Interventions  New patient evaluation completed.        [name] Ogan 09/13/2019, 6:21 AM

## 2019-09-13 NOTE — Progress Notes (Signed)
eLink Physician-Brief Progress Note Patient Name: [name] DOB: 10-21-1960 MRN: [id number]   Date of Service  09/13/2019  HPI/Events of Note  Hypernatremia and hyperosmolality.  eICU Interventions  Free water via NG tube at 150 ml Q 4 hours.        [name] [name] 09/13/2019, 9:05 PM

## 2019-09-14 DIAGNOSIS — E870 Hyperosmolality and hypernatremia: Secondary | ICD-10-CM | POA: Diagnosis not present

## 2019-09-14 DIAGNOSIS — J9601 Acute respiratory failure with hypoxia: Secondary | ICD-10-CM

## 2019-09-14 DIAGNOSIS — N179 Acute kidney failure, unspecified: Principal | ICD-10-CM

## 2019-09-14 DIAGNOSIS — F1011 Alcohol abuse, in remission: Secondary | ICD-10-CM

## 2019-09-14 DIAGNOSIS — E872 Acidosis: Secondary | ICD-10-CM

## 2019-09-14 DIAGNOSIS — G92 Toxic encephalopathy: Secondary | ICD-10-CM

## 2019-09-14 LAB — COMPREHENSIVE METABOLIC PANEL
ALT: 57 U/L — ABNORMAL HIGH (ref 0–44)
AST: 101 U/L — ABNORMAL HIGH (ref 15–41)
Albumin: 2.7 g/dL — ABNORMAL LOW (ref 3.5–5.0)
Alkaline Phosphatase: 49 U/L (ref 38–126)
Anion gap: 14 (ref 5–15)
BUN: 66 mg/dL — ABNORMAL HIGH (ref 6–20)
CO2: 18 mmol/L — ABNORMAL LOW (ref 22–32)
Calcium: 8.8 mg/dL — ABNORMAL LOW (ref 8.9–10.3)
Chloride: 116 mmol/L — ABNORMAL HIGH (ref 98–111)
Creatinine, Ser: 2.09 mg/dL — ABNORMAL HIGH (ref 0.61–1.24)
GFR calc Af Amer: 39 mL/min — ABNORMAL LOW (ref >60)
GFR calc non Af Amer: 34 mL/min — ABNORMAL LOW (ref >60)
Glucose, Bld: 206 mg/dL — ABNORMAL HIGH (ref 70–99)
Potassium: 4.2 mmol/L (ref 3.5–5.1)
Sodium: 148 mmol/L — ABNORMAL HIGH (ref 135–145)
Total Bilirubin: 1.3 mg/dL — ABNORMAL HIGH (ref 0.3–1.2)
Total Protein: 5.7 g/dL — ABNORMAL LOW (ref 6.5–8.1)

## 2019-09-14 LAB — MAGNESIUM
Magnesium: 2.3 mg/dL (ref 1.7–2.4)
Magnesium: 2.2 mg/dL (ref 1.7–2.4)
Magnesium: 2.3 mg/dL (ref 1.7–2.4)

## 2019-09-14 LAB — CBC WITH DIFFERENTIAL/PLATELET
Abs Immature Granulocytes: 0.06 10*3/uL (ref 0.00–0.07)
Basophils Absolute: 0 10*3/uL (ref 0.0–0.1)
Basophils Relative: 0 %
Eosinophils Absolute: 0 10*3/uL (ref 0.0–0.5)
Eosinophils Relative: 0 %
HCT: 44.7 % (ref 39.0–52.0)
Hemoglobin: 15 g/dL (ref 13.0–17.0)
Immature Granulocytes: 1 %
Lymphocytes Relative: 10 %
Lymphs Abs: 0.8 10*3/uL (ref 0.7–4.0)
MCH: 31.1 pg (ref 26.0–34.0)
MCHC: 33.6 g/dL (ref 30.0–36.0)
MCV: 92.5 fL (ref 80.0–100.0)
Monocytes Absolute: 1.1 10*3/uL — ABNORMAL HIGH (ref 0.1–1.0)
Monocytes Relative: 13 %
Neutro Abs: 6.2 10*3/uL (ref 1.7–7.7)
Neutrophils Relative %: 76 %
Platelets: DECREASED 10*3/uL (ref 150–400)
RBC: 4.83 MIL/uL (ref 4.22–5.81)
RDW: 14.6 % (ref 11.5–15.5)
WBC: 8.2 10*3/uL (ref 4.0–10.5)
nRBC: 0 % (ref 0.0–0.2)

## 2019-09-14 LAB — GLUCOSE, CAPILLARY
Glucose-Capillary: 169 mg/dL — ABNORMAL HIGH (ref 70–99)
Glucose-Capillary: 169 mg/dL — ABNORMAL HIGH (ref 70–99)
Glucose-Capillary: 173 mg/dL — ABNORMAL HIGH (ref 70–99)
Glucose-Capillary: 174 mg/dL — ABNORMAL HIGH (ref 70–99)
Glucose-Capillary: 186 mg/dL — ABNORMAL HIGH (ref 70–99)
Glucose-Capillary: 190 mg/dL — ABNORMAL HIGH (ref 70–99)
Glucose-Capillary: 191 mg/dL — ABNORMAL HIGH (ref 70–99)
Glucose-Capillary: 191 mg/dL — ABNORMAL HIGH (ref 70–99)
Glucose-Capillary: 195 mg/dL — ABNORMAL HIGH (ref 70–99)
Glucose-Capillary: 199 mg/dL — ABNORMAL HIGH (ref 70–99)
Glucose-Capillary: 205 mg/dL — ABNORMAL HIGH (ref 70–99)
Glucose-Capillary: 219 mg/dL — ABNORMAL HIGH (ref 70–99)
Glucose-Capillary: 223 mg/dL — ABNORMAL HIGH (ref 70–99)
Glucose-Capillary: 328 mg/dL — ABNORMAL HIGH (ref 70–99)
Glucose-Capillary: 339 mg/dL — ABNORMAL HIGH (ref 70–99)

## 2019-09-14 LAB — ETHYLENE GLYCOL: Ethylene Glycol Lvl: <5 mg/dL

## 2019-09-14 LAB — PHOSPHORUS
Phosphorus: 3.2 mg/dL (ref 2.5–4.6)
Phosphorus: 2.6 mg/dL (ref 2.5–4.6)

## 2019-09-14 MED ORDER — FREE WATER
300.0000 mL | Status: DC
  Administered 2019-09-14 – 2019-09-16 (×11): 300 mL

## 2019-09-14 MED ORDER — FOLIC ACID 5 MG/ML IJ SOLN
1.0000 mg | Freq: Every day | INTRAMUSCULAR | Status: DC
  Filled 2019-09-14 (×2): qty 0.2

## 2019-09-14 MED ORDER — INSULIN DETEMIR 100 UNIT/ML ~~LOC~~ SOLN
11.0000 [IU] | Freq: Every day | SUBCUTANEOUS | Status: DC
  Administered 2019-09-14: 11 [IU] via SUBCUTANEOUS
  Filled 2019-09-14 (×2): qty 0.11

## 2019-09-14 MED ORDER — STERILE WATER FOR INJECTION IV SOLN
INTRAVENOUS | Status: DC
  Administered 2019-09-14 – 2019-09-15 (×3): via INTRAVENOUS
  Filled 2019-09-14 (×4): qty 850

## 2019-09-14 MED ORDER — PRO-STAT SUGAR FREE PO LIQD
30.0000 mL | Freq: Four times a day (QID) | ORAL | Status: DC
  Administered 2019-09-14 – 2019-09-21 (×28): 30 mL
  Filled 2019-09-14 (×27): qty 30

## 2019-09-14 MED ORDER — INSULIN ASPART 100 UNIT/ML ~~LOC~~ SOLN
0.0000 [IU] | SUBCUTANEOUS | Status: DC
  Administered 2019-09-14: 10:00:00 3 [IU] via SUBCUTANEOUS
  Administered 2019-09-14: 11 [IU] via SUBCUTANEOUS
  Administered 2019-09-14: 3 [IU] via SUBCUTANEOUS
  Administered 2019-09-14: 11 [IU] via SUBCUTANEOUS
  Administered 2019-09-14: 5 [IU] via SUBCUTANEOUS
  Administered 2019-09-15: 8 [IU] via SUBCUTANEOUS
  Administered 2019-09-15: 11 [IU] via SUBCUTANEOUS
  Administered 2019-09-15: 5 [IU] via SUBCUTANEOUS
  Administered 2019-09-15 (×2): 11 [IU] via SUBCUTANEOUS
  Administered 2019-09-16: 8 [IU] via SUBCUTANEOUS
  Administered 2019-09-16: 3 [IU] via SUBCUTANEOUS
  Administered 2019-09-16: 20:00:00 8 [IU] via SUBCUTANEOUS
  Administered 2019-09-16: 5 [IU] via SUBCUTANEOUS
  Administered 2019-09-16 (×3): 8 [IU] via SUBCUTANEOUS
  Administered 2019-09-17: 03:00:00 5 [IU] via SUBCUTANEOUS

## 2019-09-14 MED ORDER — SODIUM CHLORIDE 0.9 % IV SOLN
2.0000 g | Freq: Two times a day (BID) | INTRAVENOUS | Status: DC
  Administered 2019-09-14 (×2): 2 g via INTRAVENOUS
  Filled 2019-09-14 (×2): qty 2

## 2019-09-14 MED ORDER — VITAL 1.5 CAL PO LIQD
1000.0000 mL | ORAL | Status: DC
  Administered 2019-09-14 – 2019-09-20 (×7): 1000 mL
  Filled 2019-09-14 (×11): qty 1000

## 2019-09-14 MED ORDER — VANCOMYCIN HCL 10 G IV SOLR
1250.0000 mg | INTRAVENOUS | Status: DC
  Administered 2019-09-14: 1250 mg via INTRAVENOUS
  Filled 2019-09-14 (×2): qty 1250

## 2019-09-14 MED ORDER — INSULIN DETEMIR 100 UNIT/ML ~~LOC~~ SOLN
10.0000 [IU] | Freq: Every day | SUBCUTANEOUS | Status: DC
  Administered 2019-09-14: 10 [IU] via SUBCUTANEOUS
  Filled 2019-09-14 (×2): qty 0.1

## 2019-09-14 NOTE — Progress Notes (Signed)
eLink Physician-Brief Progress Note Patient Name: MARQUITA SISKIND DOB: 01-02-1961 MRN: [id number]   Date of Service  09/14/2019  HPI/Events of Note  Pt needs order for morning labs.  eICU Interventions  Morning labs ordered.        [name] Ogan 09/14/2019, 3:34 AM

## 2019-09-14 NOTE — Progress Notes (Signed)
CRITICAL VALUE ALERT  Critical Value:  Serum osmolality 381  Date & Time Notied:  11/26  2025  Provider Notified: Dr. Lucile Shutters   Orders Received/Actions taken: awaiting orders

## 2019-09-14 NOTE — Progress Notes (Signed)
[name] Progress Note Patient Name: [name] DOB: Jul 06, 1961 MRN: [id number]   Date of Service  09/14/2019  HPI/Events of Note  Attempting to pull lines with left arm  eICU Interventions  Soft left wrist restraint ordered        Judd Lien 09/14/2019, 10:29 PM

## 2019-09-14 NOTE — Progress Notes (Signed)
eLink Physician-Brief Progress Note Patient Name: [name] DOB: 11/20/1960 MRN: [id number]   Date of Service  09/14/2019  HPI/Events of Note  Levemir changed back to 11 units due to recognition that he is getting D5 w iv fluids.  eICU Interventions  Insulin order changed as indicated above.        Samad Thon U Cainan Trull 09/14/2019, 12:01 AM

## 2019-09-14 NOTE — Progress Notes (Signed)
 Initial Nutrition Assessment  DOCUMENTATION CODES:   Not applicable  INTERVENTION:   Tube Feeding: Vital 1.5 at 55 ml/hr Pro-Stat 30 mL QID Provides 149 g of protein, 2380 kcals and 1003 mL of free water Meets 100% estimated calorie and protein needs   NUTRITION DIAGNOSIS:   Inadequate oral intake related to acute illness as evidenced by NPO status.  GOAL:   Patient will meet greater than or equal to 90% of their needs  MONITOR:   Vent status, Labs, Weight trends, TF tolerance, Skin  REASON FOR ASSESSMENT:   Consult, Ventilator Enteral/tube feeding initiation and management  ASSESSMENT:   58 yo male admitted with AMS and acute respiratory failure, possible toxic ingestion of ethylene glycol. PMH includes EtOH abuse, schizophrenia, DM, HTN, CAD, COPD   RD working remotely.  Patient is currently intubated on ventilator support MV: 8.9 L/min Temp (24hrs), Avg:101.7 F (38.7 C), Min:99.2 F (37.3 C), Max:102.9 F (39.4 C)  Propofol: NONE  Abd xray with OG tube in stomach, no ileus or obstruction. Large volume of stool in colon suggesting constipation  Unable to obtain diet and weight history from pt at this time. Current wt 93.2 kg; admit weight 92.7 kg. Net +2 L Weight appears to have trended down over the past few months per weight encounters  Labs: sodium 148 (H), Creatinine 2.09, BUN 66, CBGs 169-205 Meds: ss novolog, levemir, folic acid, MVI with Minerals, B-6 injection, sodium bicarb, thiamine   Diet Order:   Diet Order            Diet NPO time specified  Diet effective now              EDUCATION NEEDS:   Not appropriate for education at this time  Skin:  Skin Assessment: Skin Integrity Issues: Skin Integrity Issues:: Stage I Stage I: buttocks  Last BM:  no documented BM  Height:   Ht Readings from Last 1 Encounters:  09/13/19 6' (1.829 m)    Weight:   Wt Readings from Last 1 Encounters:  09/14/19 93.2 kg    Ideal Body Weight:      BMI:  Body mass index is 27.87 kg/m.  Estimated Nutritional Needs:   Kcal:  2356  Protein:  140-185 g  Fluid:  >/= 2 L     Shavette Shoaff MS, RDN, LDN, CNSC [phone] Pager  [phone] Weekend/On-Call Pager

## 2019-09-14 NOTE — Progress Notes (Deleted)
[name] Sills, MD responded to secure chat. Stated pt is DNR and does not need IV at this time.

## 2019-09-14 NOTE — Progress Notes (Signed)
[facility] Progress Note  Name: [name] MRN: [id number] DOB: 10/30/1960  Chief Complaint:  AMS  Subjective:  spoke with nursing 11/26 PM and poison control reports that ethylene glycol negative - unable to locate result on chart.  Fever improved.  Had 2.3 liters UOP over 11/26.  Review of systems:  Unable to obtain 2/2 intubated   ------- Background on consult:  [name] is a 58 y.o. male with a history of alcohol abuse, prior cocaine abuse, schizophrenia, diabetes, hypertension, coronary artery disease who presented to the hospital with altered mental status.  He was transferred from [facility] to [facility].  Note that he was incarcerated for drunken disorderly contact and presented from custody to the hospital.  He had been there 2 days in custody and had been taking PO.  Note that he was also on lisinopril and Metformin.  He does have a history of prior ethylene glycol ingestion.  He has been given fomepizole per poison control recommendations.  Presenting creatinine was 3.81 on 11/25.  Ethylene glycol level not available.  Initially he had a lactic acid of 2.7.  Volatile panel is pending.  Salicylate and acetaminophen within normal limits.  SARS coronavirus 2 test negative.  Repeat lactic acid down to 0.9 yesterday afternoon.  Serum osmolality was sent and not yet available.  Multiple prior suicide attempts per chart review.  Spoke with attending - team is not sure of etiology of temp.  Creatinine has improved to 2.99.  Note that his fluids were transitioned from bicarb to D51/2 normal.  He has been intubated.    Intake/Output Summary (Last 24 hours) at 09/14/2019 0711 Last data filed at 09/14/2019 0600 Gross per 24 hour  Intake 2935.66 ml  Output 2675 ml  Net 260.66 ml    Vitals:  Vitals:   09/14/19 0348 09/14/19 0400 09/14/19 0500 09/14/19 0600  BP:  108/79 109/87 126/78  Pulse:  (!) 103 98 95  Resp:  (!) 21 19 20   Temp:      TempSrc:      SpO2:   100% 99% 100%  Weight: 93.2 kg     Height:         Physical Exam:  General:  Adult male in bed intubated  HEENT: NCAT  Neck: trachea midline; no JVD Heart: tachycardic no rub  Lungs: coarse mechanical breath sounds Abdomen: softly dist/obese habitus; normal bowel sounds Extremities: no  Edema appreciated Skin: dry with skin lesions on legs  Neuro:  No agitation on exam; no sedation; opens eyes to exam  GU foley in place   Medications reviewed   Labs:  BMP Latest Ref Rng & Units 09/13/2019 09/13/2019 09/13/2019  Glucose 70 - 99 mg/dL 186(H) 290(H) 268(H)  BUN 6 - 20 mg/dL 76(H) 82(H) 84(H)  Creatinine 0.61 - 1.24 mg/dL 2.59(H) 2.90(H) 2.99(H)  BUN/Creat Ratio 9 - 20 - - -  Sodium 135 - 145 mmol/L 152(H) 150(H) 151(H)  Potassium 3.5 - 5.1 mmol/L 4.3 3.9 4.1  Chloride 98 - 111 mmol/L 119(H) 116(H) 114(H)  CO2 22 - 32 mmol/L 18(L) 20(L) 21(L)  Calcium 8.9 - 10.3 mg/dL 8.9 8.8(L) 9.0     Assessment/Plan:   # AKI -With recent insults of limited p.o. intake as well as reported lisinopril use in the setting of Metformin use - Improving with supportive care - continue same  - Would not resume Metformin on discharge - Follow AM labs   # Acute metabolic acidosis - Does  not appear consistent with ethylene glycol at this time given that he presented from custody.  Note history of Metformin use and concurrent AKI - Continue supportive measures for now - Will follow AM labs  - s/p Fomepizole per poison control.  Ethylene glycol neg per poison control report - not yet entered into epic  - Would not resume Metformin on discharge - transition back to bicarb gtt   # HTN  - off of anti-hypertensives  # Polysubstance abuse - Would need counseling to be offered when extubated and interacting  # Hypernatremia - free water - await repeawt   # Acute hypoxic resp failure  - on mech ventilation per pulm   [name] Desanctis, MD 09/14/2019 7:11 AM

## 2019-09-14 NOTE — Progress Notes (Signed)
Pharmacy Antibiotic Note  [name] is a 58 y.o. male admitted on 09/12/2019 with sepsis.  Pharmacy has been consulted for vancomycin and cefepime dosing.  Renal function is improving, Tmax 102.3, WBC WNL, PCT 0.33, LA 0.9.  Plan: Change vanc to 1250mg  IV Q24H for AUC 510 using SCr 2.09 Change cefepime to 2gm IV Q12H Monitor renal fxn, clinical progress, vanc AUC as indicated  Height: 6' (182.9 cm) Weight: 205 lb 7.5 oz (93.2 kg) IBW/kg (Calculated) : 77.6  Temp (24hrs), Avg:102 F (38.9 C), Min:99.2 F (37.3 C), Max:103.1 F (39.5 C)  Recent Labs  Lab 09/12/19 1316 09/12/19 1516  09/13/19 0015 09/13/19 0545 09/13/19 0747 09/13/19 1820 09/14/19 0718  WBC 14.8*  --   --   --  9.0  --   --  8.2  CREATININE 3.81*  --    < > 3.21* 2.99* 2.90* 2.59* 2.09*  LATICACIDVEN 2.7* 0.9  --   --   --   --   --   --    < > = values in this interval not displayed.    Estimated Creatinine Clearance: 45.1 mL/min (A) (by C-G formula based on SCr of 2.09 mg/dL (H)).    Allergies  Allergen Reactions  . Quetiapine Anaphylaxis  . Seroquel [Quetiapine Fumarate] Anxiety and Other (See Comments)    Reaction:  Nightmares   . Trazodone Anaphylaxis  . Trazodone And Nefazodone Anxiety and Other (See Comments)    Other reaction(s): Unknown Nightmares Reaction:  Nightmares   . Vistaril [Hydroxyzine Hcl] Nausea And Vomiting  . Haldol [Haloperidol Lactate] Anxiety and Other (See Comments)    Reaction:  Nightmares    Vanc 11/25 >>  Cefepime 1/25 >>   11/25 covid - negative 11/25 BCx - NGTD 11/25 UCx - negative 11/26 MRSA PCR - negative 11/26 TA - GPC on Gram stain  Elisabet Gutzmer D. Mina Marble, PharmD, BCPS, [place] 09/14/2019, 8:53 AM

## 2019-09-14 NOTE — Progress Notes (Signed)
NAME:  [name], MRN:  [id number], DOB:  Oct 07, 1961, LOS: 2 ADMISSION DATE:  09/12/2019, CONSULTATION DATE: 11/25 REFERRING MD:  Spero Geralds, MD CHIEF COMPLAINT: Toxic ingestion, altered mental status  Brief History   [name] is a 58 year old gentleman with a history of alcohol abuse disorder, schizophrenia, diabetes who presented from jail for drunk and disorderly conduct and presented to [facility] emergency department on 11/25.  In jail he was found to be obtunded and tachypneic and was therefore brought to the ED. given his history of ethylene glycol toxicity, multiple suicide attempts, and labs consistent with metabolic acidosis and acute kidney injury, he was treated empirically as ethylene glycol toxicity.  He was intubated in the [facility], ED last night, and is down to 4M ICU.  Consults:  nephrology  Micro Data:  11/25, Covid negative 11/26 sputum culture rare gram-positive cocci 11/26 MRSA PCR negative  Antimicrobials:  1 dose of metronidazole, currently on vancomycin and cefepime  Interim history/subjective:  Passed SBT for 2-3 hours, but then had periods of apnea.  More spontaneous movements today, he is moving all 4 extremities.  Fevers have improved.  Objective   Blood pressure 133/86, pulse 92, temperature 99.2 F (37.3 C), temperature source Axillary, resp. rate 19, height 6' (1.829 m), weight 93.2 kg, SpO2 99 %.    Vent Mode: CPAP;PSV FiO2 (%):  [40 %] 40 % Set Rate:  [16 bmp] 16 bmp Vt Set:  [600 mL] 600 mL PEEP:  [5 cmH20] 5 cmH20 Pressure Support:  [5 cmH20-10 cmH20] 5 cmH20 Plateau Pressure:  [11 cmH20-26 cmH20] 11 cmH20   Intake/Output Summary (Last 24 hours) at 09/14/2019 0954 Last data filed at 09/14/2019 0800 Gross per 24 hour  Intake 2832.3 ml  Output 2350 ml  Net 482.3 ml   Filed Weights   09/12/19 1400 09/13/19 0500 09/14/19 0348  Weight: 98.9 kg 92.7 kg 93.2 kg    Examination: General: Intubated, moves spontaneously, but not  purposefully. HENT: No conjunctival injection, no scleral icterus Lungs: Diminished bilaterally, no wheezes or crackles, symmetric chest wall expansion Cardiovascular: Tachycardic, regular, no murmurs rubs or gallops Abdomen: Bowel sounds are present, no tenderness to palpation, no distention no palpable masses Extremities: No edema, multiple abrasions on the bilateral lower extremities Neuro: RASS -4, awakens eyes to sternal rub MSK: No acute synovitis Lines: No central lines, peripheral IV, OG tube, endotracheal tube, condom catheter  Assessment & Plan:  History [name] is a 58 year old gentleman who presents with:  Toxic metabolic encephalopathy Anion gap metabolic acidosis - suspected secondary to metformin use with AKI. Acute hypoxemic respiratory failure, intubated for airway protection Hyperglycemia with type 2 diabetes mellitus Fevers and severe sepsis Acute kidney injury Hypernatremia History of alcohol use disorder  Plan: Decreased D5 per nephrology Increase Free H20 water flushes for hypernatremia Nutrition consult for tube feeds.  Ethylene glycol negative -Poison control did call to confirm this based on [facility] lab. Transition him off insulin drip today.  Starting Levemir tonight 10 units. Reviewed nephrology's recommendations, agree that Metformin in the setting of AKI may has caused his lactic acidosis.  This should not be resumed on discharge. We will continue to hold sedation and await neurologic recovery.  He is not awake and alert enough for extubation today. Would continue broad-spectrum antibiotics for now.  Culture data so far negative.  Best practice:  Diet: NPO will start tube feeds Pain/Anxiety/Delirium protocol (if indicated): no sedation VAP protocol (if indicated): Yes DVT prophylaxis: heparin  GI prophylaxis: PPI Glucose control: Elevated BG Foley: None has condom cath Mobility: bed rest Code Status: full code Family Communication: will communicate  today.  Disposition: remains in ICU   Labs   CBC: Recent Labs  Lab 09/12/19 1316 09/13/19 0545 09/14/19 0718  WBC 14.8* 9.0 8.2  NEUTROABS 12.2*  --  6.2  HGB 16.9 14.9 15.0  HCT 53.5* 46.4 44.7  MCV 95.5 93.2 92.5  PLT 224 138* PLATELET CLUMPS NOTED ON SMEAR, COUNT APPEARS DECREASED    Basic Metabolic Panel: Recent Labs  Lab 09/12/19 2110 09/13/19 0015 09/13/19 0545 09/13/19 0747 09/13/19 1820 09/14/19 0718  NA 147* 148* 151* 150* 152* 148*  K 4.4 4.3 4.1 3.9 4.3 4.2  CL 120* 120* 114* 116* 119* 116*  CO2 15* 15* 21* 20* 18* 18*  GLUCOSE 265* 238* 268* 290* 186* 206*  BUN 98* 95* 84* 82* 76* 66*  CREATININE 3.32* 3.21* 2.99* 2.90* 2.59* 2.09*  CALCIUM 8.2* 8.3* 9.0 8.8* 8.9 8.8*  MG  --   --  2.4  --   --  2.3  PHOS 6.0*  --  4.6 4.3  --   --    GFR: Estimated Creatinine Clearance: 45.1 mL/min (A) (by C-G formula based on SCr of 2.09 mg/dL (H)). Recent Labs  Lab 09/12/19 1316 09/12/19 1516 09/13/19 0545 09/14/19 0718  PROCALCITON  --   --  0.33  --   WBC 14.8*  --  9.0 8.2  LATICACIDVEN 2.7* 0.9  --   --     Liver Function Tests: Recent Labs  Lab 09/12/19 1316 09/12/19 2110 09/13/19 0545 09/13/19 0747 09/14/19 0718  AST 104*  --  123*  --  101*  ALT 65*  --  66*  --  57*  ALKPHOS 78  --  67  --  49  BILITOT 1.5*  --  1.1  --  1.3*  PROT 8.5*  --  6.9  --  5.7*  ALBUMIN 4.6 3.4* 3.5 3.1* 2.7*   No results for input(s): LIPASE, AMYLASE in the last 168 hours. Recent Labs  Lab 09/12/19 1325  AMMONIA 29    ABG    Component Value Date/Time   PHART 7.377 09/13/2019 0559   PCO2ART 32.5 09/13/2019 0559   PO2ART 118 (H) 09/13/2019 0559   HCO3 18.4 (L) 09/13/2019 0559   TCO2 19 (L) 05/30/2019 0535   ACIDBASEDEF 5.6 (H) 09/13/2019 0559   O2SAT 98.0 09/13/2019 0559     Coagulation Profile: Recent Labs  Lab 09/12/19 1316  INR 1.1    Cardiac Enzymes: No results for input(s): CKTOTAL, CKMB, CKMBINDEX, TROPONINI in the last 168 hours.   HbA1C: HB A1C (BAYER DCA - WAIVED)  Date/Time Value Ref Range Status  11/11/2016 11:32 AM 7.6 (H) <7.0 % Final    Comment:                                          Diabetic Adult            <7.0                                       Healthy Adult        4.3 - 5.7                                                           (  DCCT/NGSP) American Diabetes Association's Summary of Glycemic Recommendations for Adults with Diabetes: Hemoglobin A1c <7.0%. More stringent glycemic goals (A1c <6.0%) may further reduce complications at the cost of increased risk of hypoglycemia.    Hgb A1c MFr Bld  Date/Time Value Ref Range Status  09/13/2019 05:45 AM 8.1 (H) 4.8 - 5.6 % Final    Comment:    (NOTE) Pre diabetes:          5.7%-6.4% Diabetes:              >6.4% Glycemic control for   <7.0% adults with diabetes   05/03/2019 09:40 AM 8.8 (H) 4.8 - 5.6 % Final    Comment:    (NOTE) Pre diabetes:          5.7%-6.4% Diabetes:              >6.4% Glycemic control for   <7.0% adults with diabetes     CBG: Recent Labs  Lab 09/14/19 0453 09/14/19 0553 09/14/19 0644 09/14/19 0800 09/14/19 0912  GLUCAP 195* 191* 190* 205* 169*    Critical care time:   The patient is critically ill with multiple organ systems failure and requires high complexity decision making for assessment and support, frequent evaluation and titration of therapies, application of advanced monitoring technologies and extensive interpretation of multiple databases.   Critical Care Time devoted to patient care services described in this note is 39 minutes. This time reflects the time of my personal involvement. This critical care time does not reflect separately billable procedures or procedure time, teaching time or supervisory time of PA/NP/Med student/Med Resident etc but could involve care discussion time.  Leone [facility] 09/14/2019 9:54 AM  Pager: [phone] After hours  pager: [phone]

## 2019-09-15 ENCOUNTER — Inpatient Hospital Stay (HOSPITAL_COMMUNITY): Payer: Medicare Other

## 2019-09-15 DIAGNOSIS — G92 Toxic encephalopathy: Secondary | ICD-10-CM | POA: Diagnosis not present

## 2019-09-15 DIAGNOSIS — E872 Acidosis: Secondary | ICD-10-CM | POA: Diagnosis not present

## 2019-09-15 DIAGNOSIS — E1169 Type 2 diabetes mellitus with other specified complication: Secondary | ICD-10-CM

## 2019-09-15 DIAGNOSIS — J9601 Acute respiratory failure with hypoxia: Secondary | ICD-10-CM | POA: Diagnosis not present

## 2019-09-15 DIAGNOSIS — N179 Acute kidney failure, unspecified: Secondary | ICD-10-CM | POA: Diagnosis not present

## 2019-09-15 LAB — GLUCOSE, CAPILLARY
Glucose-Capillary: 314 mg/dL — ABNORMAL HIGH (ref 70–99)
Glucose-Capillary: 339 mg/dL — ABNORMAL HIGH (ref 70–99)
Glucose-Capillary: 311 mg/dL — ABNORMAL HIGH (ref 70–99)
Glucose-Capillary: 285 mg/dL — ABNORMAL HIGH (ref 70–99)
Glucose-Capillary: 227 mg/dL — ABNORMAL HIGH (ref 70–99)

## 2019-09-15 LAB — CBC
HCT: 39.5 % (ref 39.0–52.0)
Hemoglobin: 13.5 g/dL (ref 13.0–17.0)
MCH: 31 pg (ref 26.0–34.0)
MCHC: 34.2 g/dL (ref 30.0–36.0)
MCV: 90.6 fL (ref 80.0–100.0)
Platelets: DECREASED 10*3/uL (ref 150–400)
RBC: 4.36 MIL/uL (ref 4.22–5.81)
RDW: 13.7 % (ref 11.5–15.5)
WBC: 8.5 10*3/uL (ref 4.0–10.5)
nRBC: 0 % (ref 0.0–0.2)

## 2019-09-15 LAB — BASIC METABOLIC PANEL
Anion gap: 16 — ABNORMAL HIGH (ref 5–15)
BUN: 62 mg/dL — ABNORMAL HIGH (ref 6–20)
CO2: 21 mmol/L — ABNORMAL LOW (ref 22–32)
Calcium: 8.6 mg/dL — ABNORMAL LOW (ref 8.9–10.3)
Chloride: 109 mmol/L (ref 98–111)
Creatinine, Ser: 1.55 mg/dL — ABNORMAL HIGH (ref 0.61–1.24)
GFR calc Af Amer: 56 mL/min — ABNORMAL LOW (ref >60)
GFR calc non Af Amer: 48 mL/min — ABNORMAL LOW (ref >60)
Glucose, Bld: 332 mg/dL — ABNORMAL HIGH (ref 70–99)
Potassium: 3.4 mmol/L — ABNORMAL LOW (ref 3.5–5.1)
Sodium: 146 mmol/L — ABNORMAL HIGH (ref 135–145)

## 2019-09-15 LAB — PHOSPHORUS
Phosphorus: 1.9 mg/dL — ABNORMAL LOW (ref 2.5–4.6)
Phosphorus: 3.3 mg/dL (ref 2.5–4.6)

## 2019-09-15 LAB — MAGNESIUM
Magnesium: 2.2 mg/dL (ref 1.7–2.4)
Magnesium: 2.2 mg/dL (ref 1.7–2.4)

## 2019-09-15 MED ORDER — SODIUM CHLORIDE 0.45 % IV SOLN
INTRAVENOUS | Status: AC
  Administered 2019-09-15 – 2019-09-17 (×5): via INTRAVENOUS

## 2019-09-15 MED ORDER — FOLIC ACID 1 MG PO TABS
1.0000 mg | ORAL_TABLET | Freq: Every day | ORAL | Status: DC
  Administered 2019-09-15 – 2019-09-18 (×4): 1 mg via ORAL
  Filled 2019-09-15 (×4): qty 1

## 2019-09-15 MED ORDER — POTASSIUM CHLORIDE 20 MEQ PO PACK
20.0000 meq | PACK | Freq: Once | ORAL | Status: AC
  Administered 2019-09-15: 20 meq via ORAL
  Filled 2019-09-15: qty 1

## 2019-09-15 MED ORDER — LORAZEPAM 2 MG/ML IJ SOLN
2.0000 mg | Freq: Once | INTRAMUSCULAR | Status: AC
  Administered 2019-09-15: 2 mg via INTRAVENOUS
  Filled 2019-09-15: qty 1

## 2019-09-15 MED ORDER — SODIUM PHOSPHATES 45 MMOLE/15ML IV SOLN
20.0000 mmol | Freq: Once | INTRAVENOUS | Status: AC
  Administered 2019-09-15: 20 mmol via INTRAVENOUS
  Filled 2019-09-15: qty 6.67

## 2019-09-15 MED ORDER — INSULIN ASPART 100 UNIT/ML ~~LOC~~ SOLN
5.0000 [IU] | SUBCUTANEOUS | Status: DC
  Administered 2019-09-15 – 2019-09-19 (×24): 5 [IU] via SUBCUTANEOUS

## 2019-09-15 MED ORDER — LEVETIRACETAM IN NACL 1000 MG/100ML IV SOLN
1000.0000 mg | Freq: Two times a day (BID) | INTRAVENOUS | Status: DC
  Administered 2019-09-15 – 2019-09-18 (×7): 1000 mg via INTRAVENOUS
  Filled 2019-09-15 (×5): qty 100

## 2019-09-15 MED ORDER — INSULIN DETEMIR 100 UNIT/ML ~~LOC~~ SOLN
12.0000 [IU] | Freq: Every day | SUBCUTANEOUS | Status: DC
  Administered 2019-09-15 – 2019-09-16 (×2): 12 [IU] via SUBCUTANEOUS
  Filled 2019-09-15 (×3): qty 0.12

## 2019-09-15 NOTE — Progress Notes (Signed)
NAME:  [name], MRN:  [id number], DOB:  Jul 08, 1961, LOS: 3 ADMISSION DATE:  09/12/2019, CONSULTATION DATE: 11/25 REFERRING MD:  Spero Geralds, MD CHIEF COMPLAINT: Toxic ingestion, altered mental status  Brief History   [name] is a 58 year old gentleman with a history of alcohol abuse disorder, schizophrenia, diabetes who presented from jail for drunk and disorderly conduct and presented to [facility] emergency department on 11/25.  In jail he was found to be obtunded and tachypneic and was therefore brought to the ED. given his history of ethylene glycol toxicity, multiple suicide attempts, and labs consistent with metabolic acidosis and acute kidney injury, he was treated empirically as ethylene glycol toxicity.  He was intubated in the [facility], ED last night, and is down to 34M ICU.  Consults:  nephrology  Micro Data:  11/25, Covid negative 11/26 sputum culture rare gram-positive cocci 11/26 MRSA PCR negative  Antimicrobials:  1 dose of metronidazole, currently on vancomycin and cefepime  Interim history/subjective:  No overnight events.  Afebrile.  Still purposefully following commands.  Objective   Blood pressure 124/77, pulse 82, temperature 98.7 F (37.1 C), temperature source Axillary, resp. rate 17, height 6' (1.829 m), weight 96.8 kg, SpO2 100 %.    Vent Mode: PSV;CPAP FiO2 (%):  [40 %] 40 % Set Rate:  [16 bmp] 16 bmp Vt Set:  [600 mL] 600 mL PEEP:  [5 cmH20] 5 cmH20 Pressure Support:  [10 cmH20] 10 cmH20 Plateau Pressure:  [16 cmH20-18 cmH20] 18 cmH20   Intake/Output Summary (Last 24 hours) at 09/15/2019 0820 Last data filed at 09/15/2019 0600 Gross per 24 hour  Intake 2322.63 ml  Output 825 ml  Net 1497.63 ml   Filed Weights   09/13/19 0500 09/14/19 0348 09/15/19 0419  Weight: 92.7 kg 93.2 kg 96.8 kg    Examination: General: Intubated, moves spontaneously, but not purposefully. HENT: No conjunctival injection, no scleral icterus Lungs:  Diminished bilaterally, no wheezes or crackles, symmetric chest wall expansion Cardiovascular: Tachycardic, regular, no murmurs rubs or gallops Abdomen: Bowel sounds are present, no tenderness to palpation, no distention no palpable masses Extremities: No edema, multiple abrasions on the bilateral lower extremities Neuro: RASS -4, awakens eyes to sternal rub MSK: No acute synovitis Lines: No central lines, peripheral IV, OG tube, endotracheal tube, condom catheter  Assessment & Plan:  History [name] is a 58 year old gentleman who presents with:  Toxic metabolic encephalopathy Anion gap metabolic acidosis - suspected secondary to metformin use with AKI. Acute hypoxemic respiratory failure, intubated for airway protection Hyperglycemia with type 2 diabetes mellitus Fevers and severe sepsis Acute kidney injury Hypernatremia History of alcohol use disorder  Plan: Switch to D5 half-normal per nephrology Maintain Free H20 water flushes for hypernatremia  increase Levemir to 12 units, add short acting with tube feeds. Neurologic status continues to wrote prohibit extubation Culture data is negative, he is afebrile, we will stop empiric antibiotics.  Ethylene glycol negative -Poison control did call to confirm this based on [facility] Reviewed nephrology's recommendations, agree that Metformin in the setting of AKI may has caused his lactic acidosis.  This should not be resumed on discharge. Best practice:  Diet: Tube feeds at goal Pain/Anxiety/Delirium protocol (if indicated): no sedation VAP protocol (if indicated): Yes DVT prophylaxis: heparin GI prophylaxis: PPI Glucose control: Elevated BG, will increase Levemir, and give sliding scale coverage.  Along with carb coverage. Foley: None has condom cath Mobility: bed rest Code Status: full code Family Communication: will communicate today.  Disposition: remains in ICU   Labs   CBC: Recent Labs  Lab 09/12/19 1316 09/13/19 0545  09/14/19 0718  WBC 14.8* 9.0 8.2  NEUTROABS 12.2*  --  6.2  HGB 16.9 14.9 15.0  HCT 53.5* 46.4 44.7  MCV 95.5 93.2 92.5  PLT 224 138* PLATELET CLUMPS NOTED ON SMEAR, COUNT APPEARS DECREASED    Basic Metabolic Panel: Recent Labs  Lab 09/13/19 0015 09/13/19 0545 09/13/19 0747 09/13/19 1820 09/14/19 0718 09/14/19 1520 09/14/19 2105 09/15/19 0536  NA 148* 151* 150* 152* 148*  --   --   --   K 4.3 4.1 3.9 4.3 4.2  --   --   --   CL 120* 114* 116* 119* 116*  --   --   --   CO2 15* 21* 20* 18* 18*  --   --   --   GLUCOSE 238* 268* 290* 186* 206*  --   --   --   BUN 95* 84* 82* 76* 66*  --   --   --   CREATININE 3.21* 2.99* 2.90* 2.59* 2.09*  --   --   --   CALCIUM 8.3* 9.0 8.8* 8.9 8.8*  --   --   --   MG  --  2.4  --   --  2.3 2.2 2.3 2.2  PHOS  --  4.6 4.3  --   --  3.2 2.6 1.9*   GFR: Estimated Creatinine Clearance: 45.9 mL/min (A) (by C-G formula based on SCr of 2.09 mg/dL (H)). Recent Labs  Lab 09/12/19 1316 09/12/19 1516 09/13/19 0545 09/14/19 0718  PROCALCITON  --   --  0.33  --   WBC 14.8*  --  9.0 8.2  LATICACIDVEN 2.7* 0.9  --   --     Liver Function Tests: Recent Labs  Lab 09/12/19 1316 09/12/19 2110 09/13/19 0545 09/13/19 0747 09/14/19 0718  AST 104*  --  123*  --  101*  ALT 65*  --  66*  --  57*  ALKPHOS 78  --  67  --  49  BILITOT 1.5*  --  1.1  --  1.3*  PROT 8.5*  --  6.9  --  5.7*  ALBUMIN 4.6 3.4* 3.5 3.1* 2.7*   No results for input(s): LIPASE, AMYLASE in the last 168 hours. Recent Labs  Lab 09/12/19 1325  AMMONIA 29    ABG    Component Value Date/Time   PHART 7.377 09/13/2019 0559   PCO2ART 32.5 09/13/2019 0559   PO2ART 118 (H) 09/13/2019 0559   HCO3 18.4 (L) 09/13/2019 0559   TCO2 19 (L) 05/30/2019 0535   ACIDBASEDEF 5.6 (H) 09/13/2019 0559   O2SAT 98.0 09/13/2019 0559     Coagulation Profile: Recent Labs  Lab 09/12/19 1316  INR 1.1    Cardiac Enzymes: No results for input(s): CKTOTAL, CKMB, CKMBINDEX, TROPONINI in  the last 168 hours.  HbA1C: HB A1C (BAYER DCA - WAIVED)  Date/Time Value Ref Range Status  11/11/2016 11:32 AM 7.6 (H) <7.0 % Final    Comment:                                          Diabetic Adult            <7.0  Healthy Adult        4.3 - 5.7                                                           (DCCT/NGSP) American Diabetes Association's Summary of Glycemic Recommendations for Adults with Diabetes: Hemoglobin A1c <7.0%. More stringent glycemic goals (A1c <6.0%) may further reduce complications at the cost of increased risk of hypoglycemia.    Hgb A1c MFr Bld  Date/Time Value Ref Range Status  09/13/2019 05:45 AM 8.1 (H) 4.8 - 5.6 % Final    Comment:    (NOTE) Pre diabetes:          5.7%-6.4% Diabetes:              >6.4% Glycemic control for   <7.0% adults with diabetes   05/03/2019 09:40 AM 8.8 (H) 4.8 - 5.6 % Final    Comment:    (NOTE) Pre diabetes:          5.7%-6.4% Diabetes:              >6.4% Glycemic control for   <7.0% adults with diabetes     CBG: Recent Labs  Lab 09/14/19 1536 09/14/19 1920 09/14/19 2311 09/15/19 0413 09/15/19 0756  GLUCAP 219* 328* 339* 314* 339*    Critical care time:   The patient is critically ill with multiple organ systems failure and requires high complexity decision making for assessment and support, frequent evaluation and titration of therapies, application of advanced monitoring technologies and extensive interpretation of multiple databases.   Critical Care Time devoted to patient care services described in this note is 35 minutes. This time reflects the time of my personal involvement. This critical care time does not reflect separately billable procedures or procedure time, teaching time or supervisory time of PA/NP/Med student/Med Resident etc but could involve care discussion time.  Leone [facility] 09/15/2019 8:20 AM  Pager:  [phone] After hours pager: [phone]

## 2019-09-15 NOTE — Progress Notes (Signed)
[facility] Progress Note  Name: KENI ARENDT MRN: [id number] DOB: 10/29/1960  Chief Complaint:  AMS  Subjective:  Results still not on chart for volatiles/ethylene glycol but negative per report.  Had 825 mL UOP over 11/27 and another unquantified void.  Review of systems:  Unable to obtain 2/2 intubated   ------- Background on consult:  Marquell L Caminiti is a 58 y.o. male with a history of alcohol abuse, prior cocaine abuse, schizophrenia, diabetes, hypertension, coronary artery disease who presented to the hospital with altered mental status.  He was transferred from [facility] to [facility].  Note that he was incarcerated for drunken disorderly contact and presented from custody to the hospital.  He had been there 2 days in custody and had been taking PO.  Note that he was also on lisinopril and Metformin.  He does have a history of prior ethylene glycol ingestion.  He has been given fomepizole per poison control recommendations.  Presenting creatinine was 3.81 on 11/25.  Ethylene glycol level not available.  Initially he had a lactic acid of 2.7.  Volatile panel is pending.  Salicylate and acetaminophen within normal limits.  SARS coronavirus 2 test negative.  Repeat lactic acid down to 0.9 yesterday afternoon.  Serum osmolality was sent and not yet available.  Multiple prior suicide attempts per chart review.  Spoke with attending - team is not sure of etiology of temp.  Creatinine has improved to 2.99.  Note that his fluids were transitioned from bicarb to D51/2 normal.  He has been intubated.    Intake/Output Summary (Last 24 hours) at 09/15/2019 0739 Last data filed at 09/15/2019 0600 Gross per 24 hour  Intake 2425.86 ml  Output 825 ml  Net 1600.86 ml    Vitals:  Vitals:   09/15/19 0419 09/15/19 0500 09/15/19 0600 09/15/19 0733  BP:  130/77 116/82   Pulse:  86 86   Resp:  18 19   Temp:      TempSrc:      SpO2:  100% 100% 100%  Weight: 96.8 kg     Height:          Physical Exam:  General:  Adult male in bed intubated  HEENT: NCAT  Neck: trachea midline; no JVD Heart: RRR no rub  Lungs: coarse mechanical breath sounds Abdomen: softly dist/obese habitus; normal bowel sounds Extremities: no  Edema appreciated Skin: dry with skin lesions on legs  Neuro:  No agitation on exam; no sedation; opens eyes to exam and tracks - does not follow commands GU condom cath in place     Medications reviewed    Labs:  BMP Latest Ref Rng & Units 09/14/2019 09/13/2019 09/13/2019  Glucose 70 - 99 mg/dL 206(H) 186(H) 290(H)  BUN 6 - 20 mg/dL 66(H) 76(H) 82(H)  Creatinine 0.61 - 1.24 mg/dL 2.09(H) 2.59(H) 2.90(H)  BUN/Creat Ratio 9 - 20 - - -  Sodium 135 - 145 mmol/L 148(H) 152(H) 150(H)  Potassium 3.5 - 5.1 mmol/L 4.2 4.3 3.9  Chloride 98 - 111 mmol/L 116(H) 119(H) 116(H)  CO2 22 - 32 mmol/L 18(L) 18(L) 20(L)  Calcium 8.9 - 10.3 mg/dL 8.8(L) 8.9 8.8(L)     Assessment/Plan:   # AKI -With recent insults of limited p.o. intake as well as reported lisinopril use in the setting of Metformin use - Improving with supportive care - continue same  - Would not resume Metformin on discharge - Follow AM labs - renal panel ordered daily    #  Acute metabolic acidosis - Does not appear consistent with ethylene glycol at this time given that he presented from custody.  Note history of Metformin use and concurrent AKI - Continue supportive measures for now - Will follow AM labs  - s/p Fomepizole per poison control.  Ethylene glycol neg per poison control report - not yet entered into epic  - Would not resume Metformin on discharge - on bicarb - awaiting AM labs  # HTN   - stable  - off of anti-hypertensives  # Polysubstance abuse - Would need counseling to be offered when extubated and interacting  # Hypernatremia - free water deficit  - on enteric free water - await repeat renal panel   # Acute hypoxic resp failure  - on mech ventilation per  pulm   Claudia Desanctis, MD 09/15/2019 7:39 AM

## 2019-09-15 NOTE — Progress Notes (Addendum)
eLink Physician-Brief Progress Note Patient Name: [name] DOB: 1961/06/06 MRN: [id number]   Date of Service  09/15/2019  HPI/Events of Note  Camera: 30 seconds clonic tonic shaking with eye ball rolling up/tachcardia as per bed side RN. In synchrony. Not on sedation. VS stable now.  Notes, labs, meds seen.  CTH from 25 th no acute changes.  fomepizole stopped. URINE oxalate neg.  eICU Interventions  - Ativan 2 mg IV and Keppra 1 gm loading stat - get CT head stat. - EEG routine - consider neurology help/consultation.      Intervention Category Major Interventions: Seizures - evaluation and management  Elmer Sow 09/15/2019, 10:44 PM   00:20 Radiology notified about new Left basal ganglia and fromtal infarct, new from 25 th [place]. Discussed and notified NP about events. Communicated to bed side RN - ASA  300 mg via PR and Atorva 40 mg via NG ordered - neurology consultation in am. Not a candidate for tPA , time of onset un certain.   00:56 Dr Rory Percy, neurologist notified. He is going to see him.

## 2019-09-15 NOTE — Progress Notes (Signed)
Pt was placed back on previous mode from wean per MD. Pt was tolerating wean well, however the MD wanted the pt to be more alert/ awake before we try wean again.

## 2019-09-16 ENCOUNTER — Inpatient Hospital Stay (HOSPITAL_COMMUNITY): Payer: Medicare Other

## 2019-09-16 ENCOUNTER — Inpatient Hospital Stay: Payer: Self-pay

## 2019-09-16 DIAGNOSIS — E872 Acidosis: Secondary | ICD-10-CM | POA: Diagnosis not present

## 2019-09-16 DIAGNOSIS — I63529 Cerebral infarction due to unspecified occlusion or stenosis of unspecified anterior cerebral artery: Secondary | ICD-10-CM | POA: Diagnosis not present

## 2019-09-16 DIAGNOSIS — J9622 Acute and chronic respiratory failure with hypercapnia: Secondary | ICD-10-CM

## 2019-09-16 DIAGNOSIS — I6389 Other cerebral infarction: Secondary | ICD-10-CM

## 2019-09-16 DIAGNOSIS — G9340 Encephalopathy, unspecified: Secondary | ICD-10-CM | POA: Diagnosis not present

## 2019-09-16 DIAGNOSIS — E1149 Type 2 diabetes mellitus with other diabetic neurological complication: Secondary | ICD-10-CM

## 2019-09-16 DIAGNOSIS — I6340 Cerebral infarction due to embolism of unspecified cerebral artery: Secondary | ICD-10-CM | POA: Insufficient documentation

## 2019-09-16 DIAGNOSIS — R7401 Elevation of levels of liver transaminase levels: Secondary | ICD-10-CM

## 2019-09-16 DIAGNOSIS — G40409 Other generalized epilepsy and epileptic syndromes, not intractable, without status epilepticus: Secondary | ICD-10-CM

## 2019-09-16 DIAGNOSIS — N179 Acute kidney failure, unspecified: Secondary | ICD-10-CM | POA: Diagnosis not present

## 2019-09-16 DIAGNOSIS — I639 Cerebral infarction, unspecified: Secondary | ICD-10-CM

## 2019-09-16 DIAGNOSIS — I6310 Cerebral infarction due to embolism of unspecified precerebral artery: Secondary | ICD-10-CM

## 2019-09-16 DIAGNOSIS — I82412 Acute embolism and thrombosis of left femoral vein: Secondary | ICD-10-CM

## 2019-09-16 LAB — RENAL FUNCTION PANEL
Albumin: 2.5 g/dL — ABNORMAL LOW (ref 3.5–5.0)
Anion gap: 15 (ref 5–15)
BUN: 55 mg/dL — ABNORMAL HIGH (ref 6–20)
CO2: 25 mmol/L (ref 22–32)
Calcium: 8.7 mg/dL — ABNORMAL LOW (ref 8.9–10.3)
Chloride: 106 mmol/L (ref 98–111)
Creatinine, Ser: 1.31 mg/dL — ABNORMAL HIGH (ref 0.61–1.24)
GFR calc Af Amer: >60 mL/min (ref >60)
GFR calc non Af Amer: 59 mL/min — ABNORMAL LOW (ref >60)
Glucose, Bld: 205 mg/dL — ABNORMAL HIGH (ref 70–99)
Phosphorus: 2.8 mg/dL (ref 2.5–4.6)
Potassium: 3.6 mmol/L (ref 3.5–5.1)
Sodium: 146 mmol/L — ABNORMAL HIGH (ref 135–145)

## 2019-09-16 LAB — ECHOCARDIOGRAM COMPLETE
Height: 72 in
Weight: 3506.2 oz

## 2019-09-16 LAB — PLATELET BY CITRATE: Platelet CT in Citrate: DECREASED

## 2019-09-16 LAB — CBC
HCT: 37.6 % — ABNORMAL LOW (ref 39.0–52.0)
Hemoglobin: 12.7 g/dL — ABNORMAL LOW (ref 13.0–17.0)
MCH: 31 pg (ref 26.0–34.0)
MCHC: 33.8 g/dL (ref 30.0–36.0)
MCV: 91.7 fL (ref 80.0–100.0)
Platelets: DECREASED 10*3/uL (ref 150–400)
RBC: 4.1 MIL/uL — ABNORMAL LOW (ref 4.22–5.81)
RDW: 13.5 % (ref 11.5–15.5)
WBC: 6.9 10*3/uL (ref 4.0–10.5)
nRBC: 0 % (ref 0.0–0.2)

## 2019-09-16 LAB — GLUCOSE, CAPILLARY
Glucose-Capillary: 167 mg/dL — ABNORMAL HIGH (ref 70–99)
Glucose-Capillary: 238 mg/dL — ABNORMAL HIGH (ref 70–99)
Glucose-Capillary: 254 mg/dL — ABNORMAL HIGH (ref 70–99)
Glucose-Capillary: 276 mg/dL — ABNORMAL HIGH (ref 70–99)
Glucose-Capillary: 283 mg/dL — ABNORMAL HIGH (ref 70–99)
Glucose-Capillary: 290 mg/dL — ABNORMAL HIGH (ref 70–99)
Glucose-Capillary: 296 mg/dL — ABNORMAL HIGH (ref 70–99)

## 2019-09-16 MED ORDER — FREE WATER
250.0000 mL | Status: DC
  Administered 2019-09-16 – 2019-09-17 (×6): 250 mL

## 2019-09-16 MED ORDER — LEVETIRACETAM IN NACL 1000 MG/100ML IV SOLN: 1000.0000 mg | Freq: Two times a day (BID) | INTRAVENOUS | Status: DC

## 2019-09-16 MED ORDER — ASPIRIN 300 MG RE SUPP
300.0000 mg | Freq: Every day | RECTAL | Status: DC
  Administered 2019-09-17: 09:00:00 300 mg via RECTAL
  Filled 2019-09-16 (×2): qty 1

## 2019-09-16 MED ORDER — ASPIRIN 300 MG RE SUPP
300.0000 mg | Freq: Once | RECTAL | Status: AC
  Administered 2019-09-16: 300 mg via RECTAL
  Filled 2019-09-16: qty 1

## 2019-09-16 MED ORDER — HEPARIN (PORCINE) 25000 UT/250ML-% IV SOLN
1050.0000 [IU]/h | INTRAVENOUS | Status: DC
  Administered 2019-09-16: 1150 [IU]/h via INTRAVENOUS
  Administered 2019-09-17: 21:00:00 700 [IU]/h via INTRAVENOUS
  Administered 2019-09-19: 900 [IU]/h via INTRAVENOUS
  Administered 2019-09-20 – 2019-09-24 (×5): 1050 [IU]/h via INTRAVENOUS
  Filled 2019-09-16 (×8): qty 250

## 2019-09-16 MED ORDER — ORAL CARE MOUTH RINSE
15.0000 mL | OROMUCOSAL | Status: DC
  Administered 2019-09-16 – 2019-09-28 (×108): 15 mL via OROMUCOSAL

## 2019-09-16 MED ORDER — POTASSIUM CHLORIDE 20 MEQ PO PACK
40.0000 meq | PACK | Freq: Once | ORAL | Status: AC
  Administered 2019-09-16: 40 meq
  Filled 2019-09-16: qty 2

## 2019-09-16 MED ORDER — CHLORHEXIDINE GLUCONATE 0.12% ORAL RINSE (MEDLINE KIT)
15.0000 mL | Freq: Two times a day (BID) | OROMUCOSAL | Status: DC
  Administered 2019-09-16 – 2019-09-28 (×24): 15 mL via OROMUCOSAL

## 2019-09-16 MED ORDER — SODIUM CHLORIDE 0.9% FLUSH
10.0000 mL | Freq: Two times a day (BID) | INTRAVENOUS | Status: DC
  Administered 2019-09-16 – 2019-09-25 (×14): 10 mL
  Administered 2019-09-26: 20 mL
  Administered 2019-09-26 – 2019-09-27 (×2): 10 mL

## 2019-09-16 MED ORDER — INSULIN DETEMIR 100 UNIT/ML ~~LOC~~ SOLN
10.0000 [IU] | Freq: Every day | SUBCUTANEOUS | Status: DC
  Administered 2019-09-16 – 2019-09-17 (×2): 10 [IU] via SUBCUTANEOUS
  Filled 2019-09-16 (×2): qty 0.1

## 2019-09-16 MED ORDER — SODIUM CHLORIDE 0.9% FLUSH: 10.0000 mL | INTRAVENOUS | Status: DC | PRN

## 2019-09-16 MED ORDER — ATORVASTATIN CALCIUM 40 MG PO TABS
40.0000 mg | ORAL_TABLET | Freq: Every day | ORAL | Status: DC
  Administered 2019-09-16 – 2019-09-26 (×9): 40 mg via NASOGASTRIC
  Filled 2019-09-16 (×10): qty 1

## 2019-09-16 NOTE — Progress Notes (Signed)
Peripherally Inserted Central Catheter/Midline Placement  The IV Nurse has discussed with the patient and/or persons authorized to consent for the patient, the purpose of this procedure and the potential benefits and risks involved with this procedure.  The benefits include less needle sticks, lab draws from the catheter, and the patient may be discharged home with the catheter. Risks include, but not limited to, infection, bleeding, blood clot (thrombus formation), and puncture of an artery; nerve damage and irregular heartbeat and possibility to perform a PICC exchange if needed/ordered by physician.  Alternatives to this procedure were also discussed.  Bard Power PICC patient education guide, fact sheet on infection prevention and patient information card has been provided to patient /or left at bedside.  Consent obtained from niece via telephone due to altered mental status.  PICC/Midline Placement Documentation  PICC Double Lumen 09/16/19 PICC Right Brachial 39 cm 1 cm (Active)  Indication for Insertion or Continuance of Line Prolonged intravenous therapies 09/16/19 1830  Exposed Catheter (cm) 1 cm 09/16/19 1830  Site Assessment Clean;Dry;Intact 09/16/19 1830  Lumen #1 Status Flushed;Saline locked;Blood return noted 09/16/19 1830  Lumen #2 Status Flushed;Saline locked;Blood return noted 09/16/19 1830  Dressing Type Transparent 09/16/19 1830  Dressing Status Clean;Dry;Intact 09/16/19 1830  Dressing Intervention New dressing 09/16/19 [id number]  Dressing Change Due 09/23/19 09/16/19 1830       Christyanna Mckeon, Nicolette Bang 09/16/2019, 6:32 PM

## 2019-09-16 NOTE — Progress Notes (Signed)
ANTICOAGULATION CONSULT NOTE - Initial Consult  Pharmacy Consult for heparin Indication: DVT and stroke  Allergies  Allergen Reactions  . Quetiapine Anaphylaxis  . Seroquel [Quetiapine Fumarate] Anxiety and Other (See Comments)    Reaction:  Nightmares   . Trazodone Anaphylaxis  . Trazodone And Nefazodone Anxiety and Other (See Comments)    Other reaction(s): Unknown Nightmares Reaction:  Nightmares   . Vistaril [Hydroxyzine Hcl] Nausea And Vomiting  . Haldol [Haloperidol Lactate] Anxiety and Other (See Comments)    Reaction:  Nightmares     Patient Measurements: Height: 6' (182.9 cm) Weight: 219 lb 2.2 oz (99.4 kg) IBW/kg (Calculated) : 77.6 Heparin Dosing Weight: 97.7 kg  Vital Signs: Temp: 98.4 F (36.9 C) (11/29 0810) Temp Source: Oral (11/29 0810) BP: 130/73 (11/29 1507) Pulse Rate: 87 (11/29 1507)  Labs: Recent Labs    09/14/19 0718 09/15/19 0536 09/15/19 0913 09/16/19 0306  HGB 15.0  --  13.5 12.7*  HCT 44.7  --  39.5 37.6*  PLT PLATELET CLUMPS NOTED ON SMEAR, COUNT APPEARS DECREASED  --  PLATELET CLUMPS NOTED ON SMEAR, COUNT APPEARS DECREASED PLATELET CLUMPS NOTED ON SMEAR, COUNT APPEARS DECREASED  CREATININE 2.09* 1.55*  --  1.31*    Estimated Creatinine Clearance: 74.1 mL/min (A) (by C-G formula based on SCr of 1.31 mg/dL (H)).   Medical History: Past Medical History:  Diagnosis Date  . Alcohol dependence ([place]) 1979   stated abusing ETOH at age 45   . Back pain   . Benzodiazepine dependence ([place])   . Cardiac arrest ([place])   . Chronic back pain   . Cocaine abuse ([place])   . COPD (chronic obstructive pulmonary disease) ([place])   . Diabetes mellitus without complication ([place]) 0160  . Hypertension 2008  . MI (myocardial infarction) ([place])   . Non-compliance   . Schizoaffective disorder ([place]) 2006   . Substance abuse ([place])     Medications:  Scheduled:  . [START ON 09/17/2019] aspirin  300 mg Rectal Daily  . atorvastatin  40 mg Per NG tube q1800   . chlorhexidine gluconate (MEDLINE KIT)  15 mL Mouth Rinse BID  . Chlorhexidine Gluconate Cloth  6 each Topical Q0600  . feeding supplement (PRO-STAT SUGAR FREE 64)  30 mL Per Tube QID  . folic acid  1 mg Oral Daily  . free water  250 mL Per Tube Q4H  . heparin  5,000 Units Subcutaneous Q8H  . insulin aspart  0-15 Units Subcutaneous Q4H  . insulin aspart  5 Units Subcutaneous Q4H  . insulin detemir  10 Units Subcutaneous Daily  . insulin detemir  12 Units Subcutaneous QHS  . mouth rinse  15 mL Mouth Rinse 10 times per day  . multivitamin with minerals  1 tablet Per Tube Daily  . nystatin  5 mL Oral QID  . pantoprazole (PROTONIX) IV  40 mg Intravenous Q12H  . polyethylene glycol  17 g Oral Daily  . pyridOXINE  100 mg Intravenous Daily  . thiamine  100 mg Intravenous Daily    Assessment: 58 yom presenting with acute onset of encephalopathy - found overnight to have seizure like activity and CT head showing infarcts in L hemisphere. Also duplex finding acute DVT in L peroneal, common femoral, proximal profunda, and popliteal veins. No AC PTA.   Hgb 12.7, plt shown as clumped (lab estimated <120 based on second read using citrate vial). No s/sx of bleeding. Last dose of subQ hep at 1332.    Goal of  Therapy:  Heparin level 0.3-0.5 units/ml Monitor platelets by anticoagulation protocol: Yes   Plan:  Start heparin infusion at 1150 units/hr Check anti-Xa level in 6 hours and daily while on heparin Continue to monitor H&H and platelets  Antonietta Jewel, PharmD, BCCCP Clinical Pharmacist  Phone: [phone]  Please check AMION for all [place] phone numbers After 10:00 PM, call [place] [phone] 09/16/2019,3:40 PM

## 2019-09-16 NOTE — Consult Note (Addendum)
Requesting Physician: Dr. Prudencio Burly    Chief Complaint: left gaze deviation, right side weakness and seizure  History obtained from:  Chart, patient's nurse  HPI:                                                                                                                                       [name] is a 58 y.o. male with past medical history significant for alcohol dependence, benzodiazepine dependence, cocaine abuse, COPD, hypertension, diabetes mellitus admitted on 09/13/19 for acute onset of encephalopathy while in jail after being arrested for intoxication disruptive behavior 2 days prior.  Patient noted to be metabolic acidosis, uremic with acute renal failure  and possibly in sepsis in addition to intoxication and possibly overdose which was felt to be the cause for his encephalopathy.  In addition, suspicion for ethylene glycol ingestion however tox screen came back negative patient was extubated and admitted to ICU. CT head on admission was unremarkable for acute findings.  Despite weaning sedation been following commands and it was noted that patient right side appeared weaker than the left on morning of 11/28. Around 10 PM, patient had 30 seconds seizure characterized by generalized tonic-clonic shaking with club rolling of eyes.  Patient received 2 mg of Ativan and 1 g of Keppra and a stat CT head was obtained.  CT head showed new left basal ganglia and new left frontal infarct.  Neurology was consulted for further recommendations.  Patient was assessed by rapid response following seizure and noted to have left gaze deviation.  Due to concern for large vessel occlusion stat MRI MRA was performed.   Date last known well: 11.25.20 tPA Given: No, outside TPA window NIHSS: 25 Baseline MRS 0   Past Medical History:  Diagnosis Date  . Alcohol dependence ([place]) 1979   stated abusing ETOH at age 38   . Back pain   . Benzodiazepine dependence ([place])   . Cardiac arrest ([place])   .  Chronic back pain   . Cocaine abuse ([place])   . COPD (chronic obstructive pulmonary disease) ([place])   . Diabetes mellitus without complication ([place]) [id number]  . Hypertension 2008  . MI (myocardial infarction) ([place])   . Non-compliance   . Schizoaffective disorder ([place]) 2006   . Substance abuse [facility])     Past Surgical History:  Procedure Laterality Date  . CYST EXCISION      Family History  Problem Relation Age of Onset  . Heart disease Mother   . Heart disease Father   . Heart disease Maternal Grandmother   . Diabetes Maternal Grandmother    Social History:  reports that he has been smoking cigarettes. He has been smoking about 1.50 packs per day. He has never used smokeless tobacco. He reports current alcohol use. He reports previous drug use. Drug: Cocaine.  Allergies:  Allergies  Allergen Reactions  . Quetiapine Anaphylaxis  .  Seroquel [Quetiapine Fumarate] Anxiety and Other (See Comments)    Reaction:  Nightmares   . Trazodone Anaphylaxis  . Trazodone And Nefazodone Anxiety and Other (See Comments)    Other reaction(s): Unknown Nightmares Reaction:  Nightmares   . Vistaril [Hydroxyzine Hcl] Nausea And Vomiting  . Haldol [Haloperidol Lactate] Anxiety and Other (See Comments)    Reaction:  Nightmares     Medications:                                                                                                                        I reviewed home medications   ROS:                                                                                                                                     Unable to review systems due to mental status    Examination:                                                                                                      General: Appears well-developed . Psych: Affect appropriate to situation Eyes: No scleral injection HENT: No OP obstrucion Head: Normocephalic.  Cardiovascular: Normal rate and regular rhythm.  Respiratory:  Effort normal and breath sounds normal to anterior ascultation GI: Soft.  No distension. There is no tenderness.  Skin: WDI    Neurological Examination Mental Status: Patient does not track examiner, does not follow any commands Cranial Nerves: [id number] reactive, right 4 mm mm, left 4 mm mm, reduced blink to threat on the right side III,IV,VI: Left gaze deviation, crosses midline V,VII: corneal reflex: Present VIII: patient does not respond to verbal stimuli IX,X: gag reflex present XI: trapezius strength unable to test bilaterally XII: tongue strength unable to test Motor: Spontaneous movement with the right upper extremity, withdraws minimally in the left upper extremity.  Withdraws briskly in the right lower extremity and minimally in the left lower extremity. Sensory: Unable to assess due to mental status  Deep Tendon Reflexes:  Absent throughout. Plantars: Downgoing Cerebellar: Unable to perform     Lab Results: Basic Metabolic Panel: Recent Labs  Lab 09/13/19 0747 09/13/19 1820 09/14/19 0718 09/14/19 1520 09/14/19 2105 09/15/19 0536 09/15/19 1713 09/16/19 0306  NA 150* 152* 148*  --   --  146*  --  146*  K 3.9 4.3 4.2  --   --  3.4*  --  3.6  CL 116* 119* 116*  --   --  109  --  106  CO2 20* 18* 18*  --   --  21*  --  25  GLUCOSE 290* 186* 206*  --   --  332*  --  205*  BUN 82* 76* 66*  --   --  62*  --  55*  CREATININE 2.90* 2.59* 2.09*  --   --  1.55*  --  1.31*  CALCIUM 8.8* 8.9 8.8*  --   --  8.6*  --  8.7*  MG  --   --  2.3 2.2 2.3 2.2 2.2  --   PHOS 4.3  --   --  3.2 2.6 1.9* 3.3 2.8    CBC: Recent Labs  Lab 09/12/19 1316 09/13/19 0545 09/14/19 0718 09/15/19 0913  WBC 14.8* 9.0 8.2 8.5  NEUTROABS 12.2*  --  6.2  --   HGB 16.9 14.9 15.0 13.5  HCT 53.5* 46.4 44.7 39.5  MCV 95.5 93.2 92.5 90.6  PLT 224 138* PLATELET CLUMPS NOTED ON SMEAR, COUNT APPEARS DECREASED PLATELET CLUMPS NOTED ON SMEAR, COUNT APPEARS DECREASED    Coagulation Studies: No  results for input(s): LABPROT, INR in the last 72 hours.  Imaging: Ct Head Wo Contrast  Result Date: 09/16/2019 CLINICAL DATA:  58 year old male with seizure. EXAM: CT HEAD WITHOUT CONTRAST TECHNIQUE: Contiguous axial images were obtained from the base of the skull through the vertex without intravenous contrast. COMPARISON:  Head CT dated 09/12/2019. FINDINGS: Brain: The ventricles and sulci appropriate size for patient's age. Minimal periventricular and deep white matter chronic microvascular ischemic changes. Small hypodense area in the left basal ganglia, age indeterminate, but new since the prior CT of 09/12/2019. Similarly there is an ill-defined area of hypodensity in the left frontal convexity (series 3 image 29). Findings concerning for an acute infarct. Further evaluation with MRI is recommended. There is no acute intracranial hemorrhage. No mass effect or midline shift. No extra-axial fluid collection. Vascular: No hyperdense vessel or unexpected calcification. Skull: Normal. Negative for fracture or focal lesion. Sinuses/Orbits: Diffuse mucoperiosteal thickening of paranasal sinuses. No air-fluid level. The mastoid air cells are clear. Other: An endotracheal and an enteric tube are partially visualized. IMPRESSION: 1. No acute intracranial hemorrhage. 2. Ill-defined areas of hypodensity in the left frontal convexity and left basal ganglia, new since the prior CT and concerning for acute infarct. Further evaluation with MRI is recommended. These results were called by telephone at the time of interpretation on 09/16/2019 at 12:01 am to provider [facility] , who verbally acknowledged these results. Electronically Signed   By: Anner Crete M.D.   On: 09/16/2019 00:10     ASSESSMENT AND PLAN  58 y.o. male with past medical history significant for alcohol dependence, benzodiazepine dependence, cocaine abuse, COPD, hypertension, diabetes mellitus admitted on 09/13/19 for acute onset of  encephalopathy while in jail after being arrested for intoxication disruptive behavior 2 days prior.  Acute encephalopathy thought to be multifactorial secondary due to overdose/ingestion of illicit substances resulting in acute renal  failure and metabolic acidosis as well as possible sepsis.  Intubated for airway protection, however was not following commands despite weaning sedation and correcting metabolic disturbances.  Patient had tonic-clonic seizure and repeat CT head showed new infarcts in the left hemisphere.    MRI brain is interesting shows multiple small punctate areas of restriction diffusion predominantly over the cortex, however subcortical areas also involved.  The appearance is somewhat unusual for ongoing status epilepticus/seizures given subcortical findings.  However this appears to be the official radiology read.  Other differentials include multiple embolic infarctions due to a left vascular source (however MRA is negative, possibly recanalized) versus some sort of hyperperfusion/hypoperfusion phenomenon.   Restriction diffusion in the left hemisphere may be suggestive of seizure focus versus embolic infarcts/hyper and hypo-perfusion. New onset seizure Acute metabolic encephalopathy  Recommendations Stat EEG, clinically low suspicion patient is in subclinical status however cannot be ruled out. Agree with Keppra, continue 1 g twice daily.  May increase dose if EEG concerning for status. Ativan 2 mg as needed for generalized seizure lasting greater than 2 minutes MRA head and neck: Restricted by motion no large vessel occlusion or high-grade stenosis   CRITICAL CARE Performed by: Lanice Schwab Aroor   Total critical care time: 45  minutes  Critical care time was exclusive of separately billable procedures and treating other patients.  Critical care was necessary to treat or prevent imminent or life-threatening deterioration.  Critical care was time spent personally by me  on the following activities: development of treatment plan with patient and/or surrogate as well as nursing, discussions with consultants, evaluation of patient's response to treatment, examination of patient, obtaining history from patient or surrogate, ordering and performing treatments and interventions, ordering and review of laboratory studies, ordering and review of radiographic studies, pulse oximetry and re-evaluation of patient's condition.     Sushanth Aroor [facility] Pager Number [phone]

## 2019-09-16 NOTE — Progress Notes (Signed)
[facility] Progress Note  Name: [name] MRN: [id number] DOB: 11-27-1960  Chief Complaint:  AMS  Subjective:  Had 2 liters UOP over 11/28.  Neurology was consulted with concern for seizure activity on 11/28- CT head with new left basal ganglia and new left frontal infarct.  Results still not on chart for volatiles/ethylene glycol but were negative per report from poison control.   Review of systems:  Unable to obtain 2/2 intubated   ------- Background on consult:  [name] is a 58 y.o. male with a history of alcohol abuse, prior cocaine abuse, schizophrenia, diabetes, hypertension, coronary artery disease who presented to the hospital with altered mental status.  He was transferred from [facility] to [facility].  Note that he was incarcerated for drunken disorderly contact and presented from custody to the hospital.  He had been there 2 days in custody (arrested for intoxication with disruptive behavior) and had not been taking PO.  Note that he was also on lisinopril and Metformin.  He does have a history of prior ethylene glycol ingestion.  He has been given fomepizole per poison control recommendations.  Presenting creatinine was 3.81 on 11/25.  Ethylene glycol level not available.  Initially he had a lactic acid of 2.7.  Volatile panel is pending.  Salicylate and acetaminophen within normal limits.  SARS coronavirus 2 test negative.  Repeat lactic acid down to 0.9 yesterday afternoon.  Serum osmolality was sent and not yet available.  Multiple prior suicide attempts per chart review.  Spoke with attending - team is not sure of etiology of temp.  Creatinine has improved to 2.99.  Note that his fluids were transitioned from bicarb to D51/2 normal.  He has been intubated.    Intake/Output Summary (Last 24 hours) at 09/16/2019 0755 Last data filed at 09/16/2019 0600 Gross per 24 hour  Intake 3878 ml  Output 2040 ml  Net 1838 ml    Vitals:  Vitals:   09/16/19  0400 09/16/19 0537 09/16/19 0600 09/16/19 0737  BP: (!) 143/92  119/78 (!) 143/89  Pulse: 91  85 89  Resp: 16  16 16   Temp: 99.8 F (37.7 C)     TempSrc: Oral     SpO2: 100% 100% 100% 100%  Weight: 99.4 kg     Height:         Physical Exam:  General:  Adult male in bed intubated  HEENT: NCAT  Neck: trachea midline; no JVD Heart: RRR no rub  Lungs: clear to auscultation anteriorly - PEEP 5 and FIO2 30  Abdomen: softly dist/obese habitus Extremities: trace Edema appreciated Neuro:  No agitation on exam; no sedation running currently; opens eyes to exam - does not follow commands GU condom cath in place    Medications reviewed   Labs:  BMP Latest Ref Rng & Units 09/16/2019 09/15/2019 09/14/2019  Glucose 70 - 99 mg/dL 205(H) 332(H) 206(H)  BUN 6 - 20 mg/dL 55(H) 62(H) 66(H)  Creatinine 0.61 - 1.24 mg/dL 1.31(H) 1.55(H) 2.09(H)  BUN/Creat Ratio 9 - 20 - - -  Sodium 135 - 145 mmol/L 146(H) 146(H) 148(H)  Potassium 3.5 - 5.1 mmol/L 3.6 3.4(L) 4.2  Chloride 98 - 111 mmol/L 106 109 116(H)  CO2 22 - 32 mmol/L 25 21(L) 18(L)  Calcium 8.9 - 10.3 mg/dL 8.7(L) 8.6(L) 8.8(L)     Assessment/Plan:   # AKI -With recent insults of limited p.o. intake as well as reported lisinopril use in the setting of  Metformin use; hx past ingestions/suicide attempts - Improving with supportive care - continue same   # Acute metabolic acidosis - Does not appear consistent with ethylene glycol at this time given that he presented from custody.  Note history of Metformin use and concurrent AKI - Continue supportive measures  - s/p Fomepizole per poison control.  Ethylene glycol neg per poison control report - not yet entered into epic  - Would not resume Metformin   # Acute encephalopathy  - multifactorial with hx intoxication, CVA - neurology evaluating  # Acute ischemic stroke - neurology evaluating    # HTN   - goals per neuro with acute CVA  # Polysubstance abuse - Would need  counseling to be offered when extubated and interacting  # Hypernatremia  - free water deficit; hx hyperglycemia   - on enteric free water (250 every 4 hours) and 1/2 normal saline  (reordered x 24 hours)  # Acute hypoxic resp failure  - on mech ventilation per pulm  Nephrology will sign off.  Please do not hesitate to contact me with any questions regarding our patient.    Claudia Desanctis, MD 09/16/2019 7:55 AM

## 2019-09-16 NOTE — Progress Notes (Signed)
At bedside to place PICC line.  Right arm flaccid from previous CVA.  Left upper arm with multiple skin tears, abrasions, large blisters, redness, swelling and warmth present.  Due to the risk of infection in the left arm with PICC placement, the right arm was selected for insertion.   Bruising noted in RUA, lateral to the insertion site, not associated with the brachial vein upon assessment.

## 2019-09-16 NOTE — Progress Notes (Addendum)
NAME:  [name], MRN:  [id number], DOB:  [birth year], LOS: 4 ADMISSION DATE:  09/12/2019, CONSULTATION DATE: 11/25 REFERRING MD:  Spero Geralds, MD CHIEF COMPLAINT: Toxic ingestion, altered mental status  Brief History   [name] is a 58 year old gentleman with a history of alcohol abuse disorder, schizophrenia, diabetes who presented from jail for drunk and disorderly conduct and presented to [facility] emergency department on 11/25.  In jail he was found to be obtunded and tachypneic and was therefore brought to the ED. given his history of ethylene glycol toxicity, multiple suicide attempts, and labs consistent with metabolic acidosis and acute kidney injury, he was treated empirically as ethylene glycol toxicity.  He was intubated in the [facility] on admission and is now [name].  Over the course of his hospital stay he was passing his SBT's, but was unable to be extubated for mental status.  His urine and serum testing for ethylene glycol was a send out lab at [facility], and was negative.  His urine was positive only for ketones.  Fomepizole was stopped after 48 hours, and once his acid-base disorder had resolved.  Consults:  Nephrology neurology  Micro Data:  11/25, Covid negative 11/26 sputum culture rare gram-positive cocci 11/26 MRSA PCR negative  Antimicrobials:  S/p metronidazole x1 Received 48 hours empiric vancomycin and cefepime  Interim history/subjective:  Overnight developed tonic clonic seizure. Stat Head CT and MRI demonstrate acute ischemic infarct. Started on keppra and given ativan. EEG pending. Passed SBT yesterday.  Objective   Blood pressure (!) 143/89, pulse 89, temperature 98.4 F (36.9 C), temperature source Oral, resp. rate 16, height 6' (1.829 m), weight 99.4 kg, SpO2 100 %.    Vent Mode: PRVC FiO2 (%):  [30 %-40 %] 30 % Set Rate:  [16 bmp] 16 bmp Vt Set:  [600 mL] 600 mL PEEP:  [5 cmH20] 5 cmH20 Plateau Pressure:  [12 cmH20-16 cmH20] 15 cmH20    Intake/Output Summary (Last 24 hours) at 09/16/2019 0815 Last data filed at 09/16/2019 0600 Gross per 24 hour  Intake 3678.71 ml  Output 2040 ml  Net 1638.71 ml   Filed Weights   09/14/19 0348 09/15/19 0419 09/16/19 0400  Weight: 93.2 kg 96.8 kg 99.4 kg    Examination: General: Intubated, moves spontaneously, but not purposefully. HENT: No conjunctival injection, no scleral icterus Lungs: Diminished bilaterally, no wheezes or crackles, symmetric chest wall expansion Cardiovascular: regular rate and rhythm, no murmurs rubs or gallops Abdomen: Bowel sounds are present, no tenderness to palpation, no distention no palpable masses Extremities: No edema, multiple abrasions on the bilateral lower extremities Neuro: RASS -4, not following commands. Withdraws to pain. +gag MSK: No acute synovitis Lines: No central lines, peripheral IV, OG tube, endotracheal tube, condom catheter  Assessment & Plan:  History [name] is a 58 year old gentleman with alcohol abuse disorder and recurrent admissions for ethylene glycol toxicity who presents with:  Acute ischemic stroke New onset tonic-clonic seizure Toxic metabolic encephalopathy Anion gap metabolic acidosis - suspected secondary to metformin use with AKI. Acute hypoxemic respiratory failure, intubated for airway protection Hyperglycemia with type 2 diabetes mellitus Acute kidney injury Hypernatremia History of alcohol use disorder  Plan: Switch to D5 half-normal per nephrology Maintain Free H20 water flushes for hypernatremia Add twice daily Levemir 10 units in the morning, 12 units at night.  Increase Levemir to 12 units, add short acting with tube feeds with sliding scale. Daily SBT - mental status  Lung protective ventilation.  Neurology consultation - plan is to evaluate for PFO. He has a LLE DVT and heparin gtt is being held until we can evaluate for PFO.  Started on stroke protocol measures. Outside the window for TPA.      Best  practice:  Diet: Tube feeds at goal Pain/Anxiety/Delirium protocol (if indicated): no sedation VAP protocol (if indicated): Yes DVT prophylaxis: heparin sub cu GI prophylaxis: PPI Glucose control: Elevated BG, will increase Levemir, and give sliding scale coverage.  Along with carb coverage. Foley: None has condom cath Mobility: bed rest Code Status: full code Family Communication: I have called his sister [name] and his niece Mendel Ryder at the numbers listed in the computer.  There has been no answer and I have been unable to leave a voicemail.  Disposition: remains in ICU  Labs   CBC: Recent Labs  Lab 09/12/19 1316 09/13/19 0545 09/14/19 0718 09/15/19 0913 09/16/19 0306  WBC 14.8* 9.0 8.2 8.5 6.9  NEUTROABS 12.2*  --  6.2  --   --   HGB 16.9 14.9 15.0 13.5 12.7*  HCT 53.5* 46.4 44.7 39.5 37.6*  MCV 95.5 93.2 92.5 90.6 91.7  PLT 224 138* PLATELET CLUMPS NOTED ON SMEAR, COUNT APPEARS DECREASED PLATELET CLUMPS NOTED ON SMEAR, COUNT APPEARS DECREASED PLATELET CLUMPS NOTED ON SMEAR, COUNT APPEARS DECREASED    Basic Metabolic Panel: Recent Labs  Lab 09/13/19 0747 09/13/19 1820 09/14/19 0718 09/14/19 1520 09/14/19 2105 09/15/19 0536 09/15/19 1713 09/16/19 0306  NA 150* 152* 148*  --   --  146*  --  146*  K 3.9 4.3 4.2  --   --  3.4*  --  3.6  CL 116* 119* 116*  --   --  109  --  106  CO2 20* 18* 18*  --   --  21*  --  25  GLUCOSE 290* 186* 206*  --   --  332*  --  205*  BUN 82* 76* 66*  --   --  62*  --  55*  CREATININE 2.90* 2.59* 2.09*  --   --  1.55*  --  1.31*  CALCIUM 8.8* 8.9 8.8*  --   --  8.6*  --  8.7*  MG  --   --  2.3 2.2 2.3 2.2 2.2  --   PHOS 4.3  --   --  3.2 2.6 1.9* 3.3 2.8   GFR: Estimated Creatinine Clearance: 74.1 mL/min (A) (by C-G formula based on SCr of 1.31 mg/dL (H)). Recent Labs  Lab 09/12/19 1316 09/12/19 1516 09/13/19 0545 09/14/19 0718 09/15/19 0913 09/16/19 0306  PROCALCITON  --   --  0.33  --   --   --   WBC 14.8*  --  9.0 8.2 8.5  6.9  LATICACIDVEN 2.7* 0.9  --   --   --   --     Liver Function Tests: Recent Labs  Lab 09/12/19 1316 09/12/19 2110 09/13/19 0545 09/13/19 0747 09/14/19 0718 09/16/19 0306  AST 104*  --  123*  --  101*  --   ALT 65*  --  66*  --  57*  --   ALKPHOS 78  --  67  --  49  --   BILITOT 1.5*  --  1.1  --  1.3*  --   PROT 8.5*  --  6.9  --  5.7*  --   ALBUMIN 4.6 3.4* 3.5 3.1* 2.7* 2.5*   No results for input(s): LIPASE, AMYLASE in the  last 168 hours. Recent Labs  Lab 09/12/19 1325  AMMONIA 29    ABG    Component Value Date/Time   PHART 7.377 09/13/2019 0559   PCO2ART 32.5 09/13/2019 0559   PO2ART 118 (H) 09/13/2019 0559   HCO3 18.4 (L) 09/13/2019 0559   TCO2 19 (L) 05/30/2019 0535   ACIDBASEDEF 5.6 (H) 09/13/2019 0559   O2SAT 98.0 09/13/2019 0559     Coagulation Profile: Recent Labs  Lab 09/12/19 1316  INR 1.1    Cardiac Enzymes: No results for input(s): CKTOTAL, CKMB, CKMBINDEX, TROPONINI in the last 168 hours.  HbA1C: HB A1C (BAYER DCA - WAIVED)  Date/Time Value Ref Range Status  11/11/2016 11:32 AM 7.6 (H) <7.0 % Final    Comment:                                          Diabetic Adult            <7.0                                       Healthy Adult        4.3 - 5.7                                                           (DCCT/NGSP) American Diabetes Association's Summary of Glycemic Recommendations for Adults with Diabetes: Hemoglobin A1c <7.0%. More stringent glycemic goals (A1c <6.0%) may further reduce complications at the cost of increased risk of hypoglycemia.    Hgb A1c MFr Bld  Date/Time Value Ref Range Status  09/13/2019 05:45 AM 8.1 (H) 4.8 - 5.6 % Final    Comment:    (NOTE) Pre diabetes:          5.7%-6.4% Diabetes:              >6.4% Glycemic control for   <7.0% adults with diabetes   05/03/2019 09:40 AM 8.8 (H) 4.8 - 5.6 % Final    Comment:    (NOTE) Pre diabetes:          5.7%-6.4% Diabetes:              >6.4% Glycemic  control for   <7.0% adults with diabetes     CBG: Recent Labs  Lab 09/15/19 1621 09/15/19 1946 09/16/19 0010 09/16/19 0343 09/16/19 0811  GLUCAP 285* 227* 167* 238* 254*    Critical care time:   The patient is critically ill with multiple organ systems failure and requires high complexity decision making for assessment and support, frequent evaluation and titration of therapies, application of advanced monitoring technologies and extensive interpretation of multiple databases.   Critical Care Time devoted to patient care services described in this note is 43 minutes. This time reflects the time of my personal involvement. This critical care time does not reflect separately billable procedures or procedure time, teaching time or supervisory time of PA/NP/Med student/Med Resident etc but could involve care discussion time.  Leone [facility] 09/16/2019 8:15 AM  Pager: [phone] After hours pager: [phone]

## 2019-09-16 NOTE — Progress Notes (Signed)
STROKE TEAM PROGRESS NOTE   INTERVAL HISTORY His RN and vascular lab tech are at the bedside.  Pt still intubated, but open eyes on voice and able to track on the left. Left gaze preference, still hemiplegia on the right. TCD bubble study negative, but left LE DVT on LE venous doppler. Will start heparin IV.   OBJECTIVE Vitals:   09/16/19 0600 09/16/19 0737 09/16/19 0810 09/16/19 1129  BP: 119/78 (!) 143/89  (!) 141/81  Pulse: 85 89  98  Resp: 16 16  17   Temp:   98.4 F (36.9 C)   TempSrc:   Oral   SpO2: 100% 100%  99%  Weight:      Height:        CBC:  Recent Labs  Lab 09/12/19 1316  09/14/19 0718 09/15/19 0913 09/16/19 0306  WBC 14.8*   < > 8.2 8.5 6.9  NEUTROABS 12.2*  --  6.2  --   --   HGB 16.9   < > 15.0 13.5 12.7*  HCT 53.5*   < > 44.7 39.5 37.6*  MCV 95.5   < > 92.5 90.6 91.7  PLT 224   < > PLATELET CLUMPS NOTED ON SMEAR, COUNT APPEARS DECREASED PLATELET CLUMPS NOTED ON SMEAR, COUNT APPEARS DECREASED PLATELET CLUMPS NOTED ON SMEAR, COUNT APPEARS DECREASED   < > = values in this interval not displayed.    Basic Metabolic Panel:  Recent Labs  Lab 09/15/19 0536 09/15/19 1713 09/16/19 0306  NA 146*  --  146*  K 3.4*  --  3.6  CL 109  --  106  CO2 21*  --  25  GLUCOSE 332*  --  205*  BUN 62*  --  55*  CREATININE 1.55*  --  1.31*  CALCIUM 8.6*  --  8.7*  MG 2.2 2.2  --   PHOS 1.9* 3.3 2.8    Lipid Panel:     Component Value Date/Time   CHOL 127 11/14/2017 0618   CHOL 184 11/11/2016 1007   TRIG 219 (H) 09/12/2019 2110   HDL 30 (L) 11/14/2017 0618   HDL 25 (L) 11/11/2016 1007   CHOLHDL 4.2 11/14/2017 0618   VLDL 39 11/14/2017 0618   LDLCALC 58 11/14/2017 0618   LDLCALC 118 (H) 11/11/2016 1007   HgbA1c:  Lab Results  Component Value Date   HGBA1C 8.1 (H) 09/13/2019   Urine Drug Screen:     Component Value Date/Time   LABOPIA NONE DETECTED 09/12/2019 1326   COCAINSCRNUR NONE DETECTED 09/12/2019 1326   COCAINSCRNUR NONE DETECTED 12/09/2015 2242    LABBENZ NONE DETECTED 09/12/2019 1326   AMPHETMU NONE DETECTED 09/12/2019 1326   THCU NONE DETECTED 09/12/2019 1326   LABBARB NONE DETECTED 09/12/2019 1326    Alcohol Level     Component Value Date/Time   ETH <10 09/12/2019 1326    IMAGING  Ct Head Wo Contrast 09/16/2019 IMPRESSION:  1. No acute intracranial hemorrhage.  2. Ill-defined areas of hypodensity in the left frontal convexity and left basal ganglia, new since the prior CT and concerning for acute infarct. Further evaluation with MRI is recommended.   Mr Brain 56 Contrast Mr Angio Head Wo Contrast Mr Angio Neck Wo Contrast 09/16/2019 IMPRESSION:  1. Large amount of abnormal diffusion restriction, likely postictal, within the left hemisphere, predominantly cortical/subcortical.  2. Motion degraded MRA of the head and neck. No emergent large vessel occlusion or high-grade stenosis.   Vas [place] Lower Extremity Venous (dvt) 09/16/2019 Summary:  Right: There is no evidence of deep vein thrombosis in the lower extremity. No cystic structure found in the popliteal fossa.  Left: Findings consistent with acute deep vein thrombosis involving the left peroneal veins, left common femoral vein, left proximal profunda vein, and left popliteal vein. No cystic structure found in the popliteal fossa. Preliminary    Transthoracic Echocardiogram   1. Left ventricular ejection fraction, by visual estimation, is 60 to 65%. The left ventricle has normal function. Left ventricular septal wall thickness was mildly increased. Mildly increased left ventricular posterior wall thickness. There is mildly  increased left ventricular hypertrophy.  2. Global right ventricle has normal systolic function.The right ventricular size is normal. No increase in right ventricular wall thickness.  3. Left atrial size was normal.  4. Right atrial size was normal.  5. The mitral valve is normal in structure. Trace mitral valve regurgitation. No evidence of  mitral stenosis.  6. The tricuspid valve is normal in structure. Tricuspid valve regurgitation is trivial.  7. The aortic valve is normal in structure. Aortic valve regurgitation is not visualized. No evidence of aortic valve sclerosis or stenosis.  8. The pulmonic valve was normal in structure. Pulmonic valve regurgitation is not visualized.  9. Normal pulmonary artery systolic pressure. 10. The inferior vena cava is normal in size with greater than 50% respiratory variability, suggesting right atrial pressure of 3 mmHg.   ECG - ST rate 122 BPM. (See cardiology reading for complete details)  EEG - pending   PHYSICAL EXAM Blood pressure (!) 141/81, pulse 98, temperature 98.4 F (36.9 C), temperature source Oral, resp. rate 17, height 6' (1.829 m), weight 99.4 kg, SpO2 99 %.  General - Well nourished, well developed, intubated off sedation.  Ophthalmologic - fundi not visualized due to noncooperation.  Cardiovascular - Regular rate and rhythm.  Neuro - intubated off sedation, eyes closed but easily open on voice, not following commands. With eye opening, eyes in left gaze preference position, but able to cross midline but incomplete on right gaze, blinking to visual threat on the left but not on the right, doll's eyes present, tracking on the left, PERRL. Corneal reflex positive on the left, very weak on the right, gag and cough present. Breathing over the vent.  Facial symmetry not able to test due to ET tube.  Tongue protrusion not cooperative. On pain stimulation, no movement of RUE and slight withdraw on the RLE. LUE able to hold against gravity briefly on left bicep, mild withdraw to pain on the LLE. DTR 1+ and bilateral positive babinski. Sensation, coordination and gait not tested.  ASSESSMENT/PLAN Mr. HERSCHAL DICKERT is a 58 y.o. male with history of alcohol dependence, benzodiazepine dependence, cocaine abuse, CAD, tobacco use, COPD, schizoaffective disorder, hypertension, and  diabetes mellitus  presenting from jail with right sided weakness, left gaze deviation, encephalopathy, and seizure - found to have metabolic acidosis, uremic with acute renal failure  and possibly sepsis  He did not receive IV t-PA due to late presentation (>4.5 hours from time of onset)  Stroke:  left MCA and ACA numerous scattered infarcts, embolic pattern, source unclear  CT head -  Ill-defined areas of hypodensity in the left frontal convexity and left basal ganglia, new since the prior CT and concerning for acute infarct.  MRI head -  Large amount of abnormal diffusion restriction within the left hemisphere, predominantly cortical/subcortical.  MRA H&N - Motion degraded MRA of the head and neck. No emergent large vessel occlusion or  high-grade stenosis.    CTA H&N - will consider once Cre normalized LE Venous Dopplers - LLE acute DVT  TCD with bubble study - no PFO  2D Echo EF [id number]  Hold off embolic stroke work up for now given on anticoagulation for DVT  [employer] Virus 2 - negative  LDL - pending  HgbA1c - 8.1  UDS - negative  VTE prophylaxis - Heparin IV  aspirin 81 mg daily prior to admission, now on heparin IV per stroke protocol   Patient counseled to be compliant with his antithrombotic medications  Ongoing aggressive stroke risk factor management  Therapy recommendations:  pending  Disposition:  Pending  Seizure   With GTC and left gaze, lasting 30 sec  EEG pending  On keppra  LE DVT  LE Venous Dopplers - LLE acute DVT  Unclear etiology   OK to start heparin drip per stroke protocol from neuro standpoint  CBC monitoring  Respiratory failure   Intubated off sedation  CCM on board  Encephalopathy due to metabolic acidosis, AKI, hypernatremia  Creatinine 3.81-1.55-1.31  Sodium 152-148-146  On 1/2NS and free water  Nephrology on board  Hypertension  Home BP meds: Zestril ; Toprol-XL  Current BP meds: none    Stable . Permissive hypertension (OK if < 180/105) but gradually normalize in 5-7 days  . Long-term BP goal normotensive  Hyperlipidemia  Home Lipid lowering medication: Lipitor 40 mg daily  LDL - pending, goal < 70  Current lipid lowering medication: Lipitor 40 mg daily   Continue statin at discharge  Diabetes  Home diabetic meds: Insulin ; metformin  Current diabetic meds: levemir  HgbA1c 8.1, goal < 7.0  SSI  CBG monitoring  Other Stroke Risk Factors  Cigarette smoker - advised to stop smoking  ETOH use, advised to drink no more than 1 alcoholic beverage per day.  Obesity, Body mass index is 29.72 kg/m., recommend weight loss, diet and exercise as appropriate   Coronary artery disease  Substance Abuse hx - cocaine ; benzodiazepines    Medical non compliance  Other Active Problems  Hypokalemia -supplement  Hospital day # 4  This patient is critically ill due to left brain stroke, seizure, AKI, encephalopathy, respiratory failure and at significant risk of neurological worsening, death form recurrent stroke, cerebral edema, brain herniation, status epilepticus. This patient's care requires constant monitoring of vital signs, hemodynamics, respiratory and cardiac monitoring, review of multiple databases, neurological assessment, discussion with family, other specialists and medical decision making of high complexity. I spent 40 minutes of neurocritical care time in the care of this patient.  Rosalin Hawking, MD PhD Stroke Neurology 09/16/2019 6:20 PM  To contact Stroke Continuity provider, please refer to [email or web address] After hours, contact General Neurology

## 2019-09-16 NOTE — Progress Notes (Signed)
EEG complete - results pending 

## 2019-09-16 NOTE — Procedures (Signed)
History: 58 year old male with new onset seizure and left-sided strokes  Sedation: None  Technique: This is a 21 channel routine scalp EEG performed at the bedside with bipolar and monopolar montages arranged in accordance to the international 10/20 system of electrode placement. One channel was dedicated to EKG recording.    Background: There is a posterior dominant rhythm of 8 Hz which is seen bilaterally, though much more prominent on the right than the left.  There is diffuse irregular slow activity which is much higher in amplitude on the right, particularly in the frontal areas than on the left.  No epileptiform activity was seen.  Photic stimulation: Physiologic driving is not performed  EEG Abnormalities: 1) generalized irregular slow activity 2) attenuation of both high and low frequencies on the left 3) focal irregular slow activity most prominent in the right frontal region.  Clinical Interpretation: This EEG recorded evidence of a generalized nonspecific cerebral dysfunction (encephalopathy) in addition to focal dysfunction.  Though the prominence of slow activity over the right hemisphere would be suggestive of a focal right sided dysfunction, my suspicion is that this represents a generalized dysfunction with attenuation of the frequencies on the left due to his known stroke.  There is also evidence of left-sided dysfunction in the attenuation of the posterior dominant rhythm on that side.  There was no seizure or seizure predisposition recorded on this study. Please note that lack of epileptiform activity on EEG does not preclude the possibility of epilepsy.   Roland Rack, MD [facility] [phone]  If 7pm- 7am, please page neurology on call as listed in [place].

## 2019-09-16 NOTE — Progress Notes (Signed)
*  PRELIMINARY RESULTS* Echocardiogram 2D Echocardiogram has been performed.  Leavy Cella 09/16/2019, 3:03 PM

## 2019-09-16 NOTE — Progress Notes (Signed)
Called d/t L frontal acute infarct seen on CT scan that was done on patient post seizure. NIH-25 (per charting it has been 31), LVO + per exam-L sided gaze preference and R complete hemianopia. [id number] on 11/27???-when RN charted pt wasn't moving R side. Pt taken for MRI/MRA-no LVO, multiple infarcts seen. No acute interventions indicated at this time. Please call RRT if further assistance needed.

## 2019-09-16 NOTE — Progress Notes (Signed)
TCD bubble study completed. Refer to "CV Proc" under chart review to view preliminary results.  09/16/2019 12:01 PM Kelby Aline., MHA, RVT, RDCS, RDMS

## 2019-09-16 NOTE — Progress Notes (Signed)
Bilateral lower extremity venous duplex completed. Refer to "CV Proc" under chart review to view preliminary results.  Critical results discussed with Dr. Erlinda Hong. Left SCD removed.  09/16/2019 9:45 AM Kelby Aline., MHA, RVT, RDCS, RDMS

## 2019-09-17 ENCOUNTER — Inpatient Hospital Stay (HOSPITAL_COMMUNITY): Payer: Medicare Other

## 2019-09-17 DIAGNOSIS — I10 Essential (primary) hypertension: Secondary | ICD-10-CM

## 2019-09-17 DIAGNOSIS — J9601 Acute respiratory failure with hypoxia: Secondary | ICD-10-CM | POA: Diagnosis not present

## 2019-09-17 DIAGNOSIS — G92 Toxic encephalopathy: Secondary | ICD-10-CM | POA: Diagnosis not present

## 2019-09-17 LAB — GLUCOSE, CAPILLARY
Glucose-Capillary: 222 mg/dL — ABNORMAL HIGH (ref 70–99)
Glucose-Capillary: 246 mg/dL — ABNORMAL HIGH (ref 70–99)
Glucose-Capillary: 258 mg/dL — ABNORMAL HIGH (ref 70–99)
Glucose-Capillary: 258 mg/dL — ABNORMAL HIGH (ref 70–99)
Glucose-Capillary: 269 mg/dL — ABNORMAL HIGH (ref 70–99)
Glucose-Capillary: 283 mg/dL — ABNORMAL HIGH (ref 70–99)

## 2019-09-17 LAB — CULTURE, RESPIRATORY W GRAM STAIN: Culture: NORMAL

## 2019-09-17 LAB — RENAL FUNCTION PANEL
Albumin: 1.9 g/dL — ABNORMAL LOW (ref 3.5–5.0)
Anion gap: 7 (ref 5–15)
BUN: 41 mg/dL — ABNORMAL HIGH (ref 6–20)
CO2: 23 mmol/L (ref 22–32)
Calcium: 7.2 mg/dL — ABNORMAL LOW (ref 8.9–10.3)
Chloride: 103 mmol/L (ref 98–111)
Creatinine, Ser: 1.05 mg/dL (ref 0.61–1.24)
GFR calc Af Amer: >60 mL/min (ref >60)
GFR calc non Af Amer: >60 mL/min (ref >60)
Glucose, Bld: 234 mg/dL — ABNORMAL HIGH (ref 70–99)
Phosphorus: 2.3 mg/dL — ABNORMAL LOW (ref 2.5–4.6)
Potassium: 3.4 mmol/L — ABNORMAL LOW (ref 3.5–5.1)
Sodium: 133 mmol/L — ABNORMAL LOW (ref 135–145)

## 2019-09-17 LAB — CULTURE, BLOOD (ROUTINE X 2)
Culture: NO GROWTH
Culture: NO GROWTH
Special Requests: ADEQUATE
Special Requests: ADEQUATE

## 2019-09-17 LAB — CBC
HCT: 31.5 % — ABNORMAL LOW (ref 39.0–52.0)
Hemoglobin: 10.3 g/dL — ABNORMAL LOW (ref 13.0–17.0)
MCH: 30.6 pg (ref 26.0–34.0)
MCHC: 32.7 g/dL (ref 30.0–36.0)
MCV: 93.5 fL (ref 80.0–100.0)
Platelets: 104 10*3/uL — ABNORMAL LOW (ref 150–400)
RBC: 3.37 MIL/uL — ABNORMAL LOW (ref 4.22–5.81)
RDW: 13.4 % (ref 11.5–15.5)
WBC: 5.6 10*3/uL (ref 4.0–10.5)
nRBC: 0 % (ref 0.0–0.2)

## 2019-09-17 LAB — MISCELLANEOUS TEST

## 2019-09-17 LAB — HEPARIN LEVEL (UNFRACTIONATED)
Heparin Unfractionated: 0.59 [IU]/mL (ref 0.30–0.70)
Heparin Unfractionated: 0.65 [IU]/mL (ref 0.30–0.70)
Heparin Unfractionated: 0.71 IU/mL — ABNORMAL HIGH (ref 0.30–0.70)

## 2019-09-17 LAB — LIPID PANEL
Cholesterol: 122 mg/dL (ref 0–200)
HDL: 21 mg/dL — ABNORMAL LOW (ref >40)
LDL Cholesterol: 69 mg/dL (ref 0–99)
Total CHOL/HDL Ratio: 5.8 RATIO
Triglycerides: 158 mg/dL — ABNORMAL HIGH (ref <150)
VLDL: 32 mg/dL (ref 0–40)

## 2019-09-17 LAB — MAGNESIUM: Magnesium: 2 mg/dL (ref 1.7–2.4)

## 2019-09-17 LAB — VOLATILES,BLD-ACETONE,ETHANOL,ISOPROP,METHANOL
Acetone, blood: 0.032 g/dL — ABNORMAL HIGH (ref 0.000–0.010)
Ethanol, blood: <0.01 g/dL (ref 0.000–0.010)
Isopropanol, blood: <0.01 g/dL (ref 0.000–0.010)
Methanol, blood: <0.01 g/dL (ref 0.000–0.010)

## 2019-09-17 LAB — VITAMIN B12: Vitamin B-12: 314 pg/mL (ref 180–914)

## 2019-09-17 LAB — RPR: RPR Ser Ql: NONREACTIVE

## 2019-09-17 LAB — FOLATE: Folate: 32.4 ng/mL (ref >5.9)

## 2019-09-17 MED ORDER — POTASSIUM CHLORIDE 20 MEQ/15ML (10%) PO SOLN
20.0000 meq | ORAL | Status: AC
  Administered 2019-09-17 (×2): 20 meq
  Filled 2019-09-17 (×2): qty 15

## 2019-09-17 MED ORDER — PNEUMOCOCCAL VAC POLYVALENT 25 MCG/0.5ML IJ INJ
0.5000 mL | INJECTION | INTRAMUSCULAR | Status: AC
  Administered 2019-09-18: 0.5 mL via INTRAMUSCULAR
  Filled 2019-09-17: qty 0.5

## 2019-09-17 MED ORDER — INSULIN DETEMIR 100 UNIT/ML ~~LOC~~ SOLN
14.0000 [IU] | Freq: Two times a day (BID) | SUBCUTANEOUS | Status: DC
  Administered 2019-09-17: 21:00:00 14 [IU] via SUBCUTANEOUS
  Filled 2019-09-17 (×3): qty 0.14

## 2019-09-17 MED ORDER — FENTANYL CITRATE (PF) 100 MCG/2ML IJ SOLN: 100.0000 ug | Freq: Once | INTRAMUSCULAR | Status: DC

## 2019-09-17 MED ORDER — IOHEXOL 350 MG/ML SOLN
75.0000 mL | Freq: Once | INTRAVENOUS | Status: AC | PRN
  Administered 2019-09-17: 22:00:00 75 mL via INTRAVENOUS

## 2019-09-17 MED ORDER — POTASSIUM PHOSPHATES 15 MMOLE/5ML IV SOLN
10.0000 mmol | Freq: Once | INTRAVENOUS | Status: AC
  Administered 2019-09-17: 13:00:00 10 mmol via INTRAVENOUS
  Filled 2019-09-17: qty 3.33

## 2019-09-17 MED ORDER — INSULIN ASPART 100 UNIT/ML ~~LOC~~ SOLN
0.0000 [IU] | SUBCUTANEOUS | Status: DC
  Administered 2019-09-17 (×4): 11 [IU] via SUBCUTANEOUS
  Administered 2019-09-17: 7 [IU] via SUBCUTANEOUS
  Administered 2019-09-18: 11 [IU] via SUBCUTANEOUS
  Administered 2019-09-18 (×2): 7 [IU] via SUBCUTANEOUS
  Administered 2019-09-18 – 2019-09-19 (×4): 11 [IU] via SUBCUTANEOUS
  Administered 2019-09-19: 7 [IU] via SUBCUTANEOUS
  Administered 2019-09-19: 11 [IU] via SUBCUTANEOUS
  Administered 2019-09-19 (×3): 7 [IU] via SUBCUTANEOUS
  Administered 2019-09-20: 11 [IU] via SUBCUTANEOUS
  Administered 2019-09-20: 15 [IU] via SUBCUTANEOUS
  Administered 2019-09-20 (×3): 11 [IU] via SUBCUTANEOUS
  Administered 2019-09-20 – 2019-09-21 (×3): 7 [IU] via SUBCUTANEOUS
  Administered 2019-09-21: 11 [IU] via SUBCUTANEOUS
  Administered 2019-09-21 (×2): 7 [IU] via SUBCUTANEOUS
  Administered 2019-09-22: 4 [IU] via SUBCUTANEOUS
  Administered 2019-09-22 (×2): 7 [IU] via SUBCUTANEOUS
  Administered 2019-09-22 – 2019-09-23 (×5): 4 [IU] via SUBCUTANEOUS
  Administered 2019-09-23 – 2019-09-24 (×3): 3 [IU] via SUBCUTANEOUS
  Administered 2019-09-25 (×2): 4 [IU] via SUBCUTANEOUS
  Administered 2019-09-25: 7 [IU] via SUBCUTANEOUS
  Administered 2019-09-25: 4 [IU] via SUBCUTANEOUS
  Administered 2019-09-26: 3 [IU] via SUBCUTANEOUS
  Administered 2019-09-26 (×2): 7 [IU] via SUBCUTANEOUS
  Administered 2019-09-26 – 2019-09-27 (×5): 4 [IU] via SUBCUTANEOUS
  Administered 2019-09-27: 3 [IU] via SUBCUTANEOUS

## 2019-09-17 MED ORDER — BACITRACIN ZINC 500 UNIT/GM EX OINT
TOPICAL_OINTMENT | Freq: Every day | CUTANEOUS | Status: DC
  Administered 2019-09-17: 11:00:00 via TOPICAL
  Administered 2019-09-18: 1 via TOPICAL
  Administered 2019-09-19: 09:00:00 via TOPICAL
  Administered 2019-09-20: 1 via TOPICAL
  Administered 2019-09-21 – 2019-09-28 (×7): via TOPICAL
  Filled 2019-09-17 (×2): qty 28.4

## 2019-09-17 MED ORDER — INFLUENZA VAC SPLIT QUAD 0.5 ML IM SUSY
0.5000 mL | PREFILLED_SYRINGE | INTRAMUSCULAR | Status: AC
  Administered 2019-09-18: 0.5 mL via INTRAMUSCULAR
  Filled 2019-09-17: qty 0.5

## 2019-09-17 NOTE — Progress Notes (Signed)
[place] for heparin Indication: DVT and stroke  Allergies  Allergen Reactions  . Quetiapine Anaphylaxis  . Seroquel [Quetiapine Fumarate] Anxiety and Other (See Comments)    Reaction:  Nightmares   . Trazodone Anaphylaxis  . Trazodone And Nefazodone Anxiety and Other (See Comments)    Other reaction(s): Unknown Nightmares Reaction:  Nightmares   . Vistaril [Hydroxyzine Hcl] Nausea And Vomiting  . Haldol [Haloperidol Lactate] Anxiety and Other (See Comments)    Reaction:  Nightmares     Patient Measurements: Height: 6' (182.9 cm) Weight: 219 lb 2.2 oz (99.4 kg) IBW/kg (Calculated) : 77.6 Heparin Dosing Weight: 97.7 kg  Vital Signs: Temp: 98.6 F (37 C) (11/29 2313) Temp Source: Oral (11/29 2313) BP: 131/76 (11/29 2325) Pulse Rate: 96 (11/29 2325)  Labs: Recent Labs    09/14/19 0718 09/15/19 0536 09/15/19 0913 09/16/19 0306 09/17/19 0005  HGB 15.0  --  13.5 12.7*  --   HCT 44.7  --  39.5 37.6*  --   PLT PLATELET CLUMPS NOTED ON SMEAR, COUNT APPEARS DECREASED  --  PLATELET CLUMPS NOTED ON SMEAR, COUNT APPEARS DECREASED PLATELET CLUMPS NOTED ON SMEAR, COUNT APPEARS DECREASED  --   HEPARINUNFRC  --   --   --   --  0.71*  CREATININE 2.09* 1.55*  --  1.31*  --     Estimated Creatinine Clearance: 74.1 mL/min (A) (by C-G formula based on SCr of 1.31 mg/dL (H)).   Assessment: 41 yom presenting with acute onset of encephalopathy - found overnight to have seizure like activity and CT head showing infarcts in L hemisphere. Also duplex finding acute DVT in L peroneal, common femoral, proximal profunda, and popliteal veins. No AC PTA.   Heparin level supratherapeutic (0.71) on gtt at 1150 units/hr. No issues with line or bleeding reported per RN.   Goal of Therapy:  Heparin level 0.3-0.5 units/ml Monitor platelets by anticoagulation protocol: Yes   Plan:  Decrease heparin infusion to 950 units/hr F/u 6 hr heparin level  Sherlon Handing, PharmD, BCPS Please see amion for complete clinical pharmacist phone list 09/17/2019,12:47 AM

## 2019-09-17 NOTE — Progress Notes (Signed)
STROKE TEAM PROGRESS NOTE   INTERVAL HISTORY RN at bedside. Pt still on vent, intubated, able to track on the left. Not able to wean off vent, did weaning trial today, only can tolerate 4 hours and then became agitation and increased work of breath. On heparin drip.  OBJECTIVE Vitals:   09/17/19 0900 09/17/19 1100 09/17/19 1154 09/17/19 1214  BP: 129/73     Pulse: 86 79 93   Resp: 16 14 17    Temp:    98.2 F (36.8 C)  TempSrc:    Oral  SpO2: 100% 100% 100%   Weight:      Height:        CBC:  Recent Labs  Lab 09/12/19 1316  09/14/19 0718  09/16/19 0306 09/17/19 0312  WBC 14.8*   < > 8.2   < > 6.9 5.6  NEUTROABS 12.2*  --  6.2  --   --   --   HGB 16.9   < > 15.0   < > 12.7* 10.3*  HCT 53.5*   < > 44.7   < > 37.6* 31.5*  MCV 95.5   < > 92.5   < > 91.7 93.5  PLT 224   < > PLATELET CLUMPS NOTED ON SMEAR, COUNT APPEARS DECREASED   < > PLATELET CLUMPS NOTED ON SMEAR, COUNT APPEARS DECREASED 104*   < > = values in this interval not displayed.    Basic Metabolic Panel:  Recent Labs  Lab 09/15/19 1713 09/16/19 0306 09/17/19 0312 09/17/19 1023  NA  --  146* 133*  --   K  --  3.6 3.4*  --   CL  --  106 103  --   CO2  --  25 23  --   GLUCOSE  --  205* 234*  --   BUN  --  55* 41*  --   CREATININE  --  1.31* 1.05  --   CALCIUM  --  8.7* 7.2*  --   MG 2.2  --   --  2.0  PHOS 3.3 2.8 2.3*  --     Lipid Panel:     Component Value Date/Time   CHOL 122 09/17/2019 0312   CHOL 184 11/11/2016 1007   TRIG 158 (H) 09/17/2019 0312   HDL 21 (L) 09/17/2019 0312   HDL 25 (L) 11/11/2016 1007   CHOLHDL 5.8 09/17/2019 0312   VLDL 32 09/17/2019 0312   LDLCALC 69 09/17/2019 0312   LDLCALC 118 (H) 11/11/2016 1007   HgbA1c:  Lab Results  Component Value Date   HGBA1C 8.1 (H) 09/13/2019   Urine Drug Screen:     Component Value Date/Time   LABOPIA NONE DETECTED 09/12/2019 1326   COCAINSCRNUR NONE DETECTED 09/12/2019 1326   COCAINSCRNUR NONE DETECTED 12/09/2015 2242   LABBENZ  NONE DETECTED 09/12/2019 1326   AMPHETMU NONE DETECTED 09/12/2019 1326   THCU NONE DETECTED 09/12/2019 1326   LABBARB NONE DETECTED 09/12/2019 1326    Alcohol Level     Component Value Date/Time   ETH <10 09/12/2019 1326    IMAGING  Ct Head Wo Contrast 09/16/2019 IMPRESSION:  1. No acute intracranial hemorrhage.  2. Ill-defined areas of hypodensity in the left frontal convexity and left basal ganglia, new since the prior CT and concerning for acute infarct. Further evaluation with MRI is recommended.   Mr Brain 84 Contrast Mr Angio Head Wo Contrast Mr Angio Neck Wo Contrast 09/16/2019 IMPRESSION:  1. Large amount of abnormal diffusion restriction,  likely postictal, within the left hemisphere, predominantly cortical/subcortical.  2. Motion degraded MRA of the head and neck. No emergent large vessel occlusion or high-grade stenosis.   Vas [place] Lower Extremity Venous (dvt) 09/16/2019 Summary:  Right: There is no evidence of deep vein thrombosis in the lower extremity. No cystic structure found in the popliteal fossa.  Left: Findings consistent with acute deep vein thrombosis involving the left peroneal veins, left common femoral vein, left proximal profunda vein, and left popliteal vein. No cystic structure found in the popliteal fossa. Preliminary    Transthoracic Echocardiogram   1. Left ventricular ejection fraction, by visual estimation, is 60 to 65%. The left ventricle has normal function. Left ventricular septal wall thickness was mildly increased. Mildly increased left ventricular posterior wall thickness. There is mildly  increased left ventricular hypertrophy.  2. Global right ventricle has normal systolic function.The right ventricular size is normal. No increase in right ventricular wall thickness.  3. Left atrial size was normal.  4. Right atrial size was normal.  5. The mitral valve is normal in structure. Trace mitral valve regurgitation. No evidence of mitral  stenosis.  6. The tricuspid valve is normal in structure. Tricuspid valve regurgitation is trivial.  7. The aortic valve is normal in structure. Aortic valve regurgitation is not visualized. No evidence of aortic valve sclerosis or stenosis.  8. The pulmonic valve was normal in structure. Pulmonic valve regurgitation is not visualized.  9. Normal pulmonary artery systolic pressure. 10. The inferior vena cava is normal in size with greater than 50% respiratory variability, suggesting right atrial pressure of 3 mmHg.  ECG - ST rate 122 BPM. (See cardiology reading for complete details)  EEG - This EEG recorded evidence of a generalized nonspecific cerebral dysfunction (encephalopathy) in addition to focal dysfunction.  Though the prominence of slow activity over the right hemisphere would be suggestive of a focal right sided dysfunction, my suspicion is that this represents a generalized dysfunction with attenuation of the frequencies on the left due to his known stroke.  There is also evidence of left-sided dysfunction in the attenuation of the posterior dominant rhythm on that side. There was no seizure or seizure predisposition recorded on this study.    PHYSICAL EXAM  Blood pressure 129/73, pulse 93, temperature 98.2 F (36.8 C), temperature source Oral, resp. rate 17, height 6' (1.829 m), weight 102.3 kg, SpO2 100 %.  General - Well nourished, well developed, intubated off sedation.  Ophthalmologic - fundi not visualized due to noncooperation.  Cardiovascular - Regular rate and rhythm.  Neuro - intubated off sedation, eyes closed but easily open on voice, not following commands. With eye opening, eyes in left gaze preference position, but able to cross midline but incomplete on right gaze, blinking to visual threat on the left but not on the right, doll's eyes present, tracking on the left, PERRL. Corneal reflex positive on the left, very weak on the right, gag and cough present.  Breathing over the vent.  Facial symmetry not able to test due to ET tube.  Tongue protrusion not cooperative. On pain stimulation, no movement of RUE and slight withdraw on the RLE. LUE able to hold against gravity briefly on left bicep, mild withdraw to pain on the LLE. DTR 1+ and bilateral positive babinski. Sensation, coordination and gait not tested.  ASSESSMENT/PLAN Mr. ISADORE LABAT is a 58 y.o. 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  MRI head -  Large amount of abnormal diffusion restriction within the left hemisphere, predominantly cortical/subcortical.  MRA H&N - Motion degraded MRA of the head and neck. No emergent large vessel occlusion or high-grade stenosis.    CTA H&N pending LE Venous Dopplers - LLE acute DVT  TCD with bubble study - no PFO  2D Echo EF 60-65%  Hold off other embolic stroke work up for now given on anticoagulation for DVT  [name] Virus 2 - negative  LDL - 69  HgbA1c - 8.1  UDS - negative  VTE prophylaxis - Heparin IV    aspirin 81 mg daily prior to admission, now on heparin IV per stroke protocol   Therapy recommendations:  pending  Disposition:  Pending  Seizure   With GTC and left gaze, lasting 30 sec  EEG no seizure, generalized and localized encephalopathy  On keppra  LE DVT  LE Venous Dopplers  - LLE acute DVT  Unclear etiology   On heparin drip per stroke protocol    CBC monitoring  Respiratory failure   Intubated off sedation  CCM on board  Not able to tolerate more than 4h of weaning  Encephalopathy due to metabolic acidosis, AKI, hypernatremia  Creatinine 3.81-1.55-1.31-1.05   Sodium 152-148-146-133   Off 1/2NS and free water  Nephrology on board  Hypertension  Home BP meds: Zestril ; Toprol-XL  Current BP meds: none   Stable . Permissive hypertension (OK if < 180/105) but gradually normalize in 5-7 days  . Long-term BP goal normotensive  Hyperlipidemia  Home Lipid lowering medication: Lipitor 40 mg daily  LDL - 69, at goal < 70  Current lipid lowering medication: Lipitor 40 mg daily   Continue statin at discharge  Diabetes type II, uncontrolled  Home diabetic meds: Insulin ; metformin  Current diabetic meds: levemir  HgbA1c 8.1, goal < 7.0  SSI  CBG monitoring  DB RN following. Recommends increase in levimir and not continuing metformin at d/c  Other Stroke Risk Factors  Cigarette smoker - will advise to stop smoking  ETOH use, advised to drink no more than 1 alcoholic beverage per day.  Hx substance abuse  Obesity, Body mass index is 30.59 kg/m., recommend weight loss, diet and exercise as appropriate   Coronary artery disease  Substance Abuse hx - cocaine ; benzodiazepines    Medical non compliance  Other Active Problems  Acute hypoxemic respiratory failure on vent.  Acute toxic metabolic encephalopathy  ARF  Schizoaffective d/o  MDD  Hx suicide ateempts  Normocytic anemia  Thrombocytopenia   Hypokalemia -supplement  Hospital day # 5  This patient is critically ill due to left brain stroke, seizure, AKI, encephalopathy, respiratory failure and at significant risk of neurological worsening, death form recurrent stroke, cerebral edema, brain herniation, status epilepticus. This patient's care requires  constant monitoring of vital signs, hemodynamics, respiratory and cardiac monitoring, review of multiple databases, neurological assessment, discussion with family, other specialists and medical decision  making of high complexity. I spent 30 minutes of neurocritical care time in the care of this patient.  Rosalin Hawking, MD PhD Stroke Neurology 09/17/2019 12:58 PM  To contact Stroke Continuity provider, please refer to [email or web address] After hours, contact General Neurology

## 2019-09-17 NOTE — Progress Notes (Signed)
[facility] ADULT ICU REPLACEMENT PROTOCOL FOR AM LAB REPLACEMENT ONLY  The patient does apply for the [facility] Adult ICU Electrolyte Replacment Protocol based on the criteria listed below:   1. Is GFR >/= 40 ml/min? Yes.    Patient's GFR today is >60 2. Is urine output >/= 0.5 ml/kg/hr for the last 6 hours? Yes.   Patient's UOP is 1.38 ml/kg/hr 3. Is BUN < 60 mg/dL? Yes.    Patient's BUN today is 41 4. Abnormal electrolyte(s): K+3.4 5. Ordered repletion with: protocol 6. If a panic level lab has been reported, has the CCM MD in charge been notified? Yes.  .   Physician:  Dr.Sommer  [name] 09/17/2019 5:25 AM

## 2019-09-17 NOTE — Progress Notes (Signed)
[place] for heparin Indication: DVT and stroke  Allergies  Allergen Reactions  . Quetiapine Anaphylaxis  . Seroquel [Quetiapine Fumarate] Anxiety and Other (See Comments)    Reaction:  Nightmares   . Trazodone Anaphylaxis  . Trazodone And Nefazodone Anxiety and Other (See Comments)    Other reaction(s): Unknown Nightmares Reaction:  Nightmares   . Vistaril [Hydroxyzine Hcl] Nausea And Vomiting  . Haldol [Haloperidol Lactate] Anxiety and Other (See Comments)    Reaction:  Nightmares     Patient Measurements: Height: 6' (182.9 cm) Weight: 225 lb 8.5 oz (102.3 kg) IBW/kg (Calculated) : 77.6 Heparin Dosing Weight: 97.7 kg  Vital Signs: Temp: 98.8 F (37.1 C) (11/30 1520) Temp Source: Oral (11/30 1520) BP: 136/75 (11/30 1523) Pulse Rate: 84 (11/30 1523)  Labs: Recent Labs    09/15/19 0536  09/15/19 0913 09/16/19 0306 09/17/19 0005 09/17/19 0312 09/17/19 0658 09/17/19 1530  HGB  --    < > 13.5 12.7*  --  10.3*  --   --   HCT  --   --  39.5 37.6*  --  31.5*  --   --   PLT  --   --  PLATELET CLUMPS NOTED ON SMEAR, COUNT APPEARS DECREASED PLATELET CLUMPS NOTED ON SMEAR, COUNT APPEARS DECREASED  --  104*  --   --   HEPARINUNFRC  --   --   --   --  0.71*  --  0.65 0.59  CREATININE 1.55*  --   --  1.31*  --  1.05  --   --    < > = values in this interval not displayed.    Estimated Creatinine Clearance: 93.8 mL/min (by C-G formula based on SCr of 1.05 mg/dL).   Assessment: 48 yom presenting with acute onset of encephalopathy - found to have seizure like activity and CT head showing infarcts in L hemisphere. Also duplex finding acute DVT in L peroneal, common femoral, proximal profunda, and popliteal veins. No AC PTA.   Heparin level supratherapeutic (0.59) for lower goal with acute CVA. No issues with line or bleeding reported per RN.   Goal of Therapy:  Heparin level 0.3-0.5 units/ml Monitor platelets by anticoagulation  protocol: Yes   Plan:  Decrease heparin infusion to 700 units/hr Recheck at 0000 Monitor daily heparin level and CBC, s/sx bleeding  Barth Kirks, PharmD, BCPS, BCCCP Clinical Pharmacist [phone]  Please check AMION for all [place] numbers  09/17/2019 4:40 PM

## 2019-09-17 NOTE — Progress Notes (Signed)
Inpatient Diabetes Program Recommendations  AACE/ADA: New Consensus Statement on Inpatient Glycemic Control (2015)  Target Ranges:  Prepandial:   less than 140 mg/dL      Peak postprandial:   less than 180 mg/dL (1-2 hours)      Critically ill patients:  140 - 180 mg/dL   Lab Results  Component Value Date   GLUCAP 258 (H) 09/17/2019   HGBA1C 8.1 (H) 09/13/2019    Review of Glycemic Control Results for [name], [name] (MRN [id number]) as of 09/17/2019 11:11  Ref. Range 09/16/2019 19:24 09/16/2019 23:12 09/17/2019 03:22 09/17/2019 07:33  Glucose-Capillary Latest Ref Range: 70 - 99 mg/dL 283 (H) 276 (H) 246 (H) 258 (H)   Diabetes history: Type 2 DM Outpatient Diabetes medications: Novolog 1-30 units TID SSI, Basaglar 20 units QHS, Metformin 1000 mg BID Current orders for Inpatient glycemic control: Novolog 0-20 units TID, Novolog 5 units Q4H, Levemir-12 units QHS, 10 units QD  Inpatient Diabetes Program Recommendations:    Glucose trends continue to exceed 250's mg/dL.   Consider increasing Levemir to 14 units BID.  Agree with not continuing Metformin at discharge.   Thanks, Bronson Curb, MSN, RNC-OB Diabetes Coordinator [phone] (8a-5p)

## 2019-09-17 NOTE — Progress Notes (Signed)
NAME:  [name], MRN:  [id number], DOB:  Feb 14, 1961, LOS: 5 ADMISSION DATE:  09/12/2019, CONSULTATION DATE: 11/25 REFERRING MD:  [name] Geralds, MD CHIEF COMPLAINT: Toxic ingestion, altered mental status  Brief History   [name] is a 58 year old gentleman with a history of alcohol abuse disorder, schizophrenia, diabetes who presented from jail for drunk and disorderly conduct and presented to [name] emergency department on 11/25.  In jail he was found to be obtunded and tachypneic and was therefore brought to the ED. given his history of ethylene glycol toxicity, multiple suicide attempts, and labs consistent with metabolic acidosis and acute kidney injury, he was treated empirically as ethylene glycol toxicity.  He was intubated in the [name] on admission and is now [name].  Over the course of his [name] stay he was passing his SBT's, but was unable to be extubated for mental status.  His urine and serum testing for ethylene glycol was a send out lab at [name], and was negative.  His urine was positive only for ketones.  Fomepizole was stopped after 48 hours, and once his acid-base disorder had resolved.  Consults:  Nephrology neurology  Micro Data:  11/25, Covid negative 11/26 sputum culture rare gram-positive cocci 11/26 MRSA PCR negative  Significant Diagnostic Tests:  11/25 CT H> no acute intracranial abnormality 11/28 CT H> L frontal hypodensity 11/29 MRI/A h/n>L hemispheric abnormal diffusion restriction. MRA without large vessel occlusion or stenosis however exam limited by motion degradation  11/29 ECHO> LVEF 60-65%, no septal defect  Antimicrobials:  S/p metronidazole x1 Received 48 hours empiric vancomycin and cefepime  Interim history/subjective:  Afebrile overnight Weaning on PSV/CPAP this morning  Is not following commands  Objective   Blood pressure (!) 145/73, pulse 90, temperature 99 F (37.2 C), temperature source Axillary, resp. rate 18, height 6'  (1.829 m), weight 102.3 kg, SpO2 100 %.    Vent Mode: PRVC FiO2 (%):  [30 %] 30 % Set Rate:  [16 bmp] 16 bmp Vt Set:  [600 mL] 600 mL PEEP:  [5 cmH20] 5 cmH20 Pressure Support:  [10 cmH20] 10 cmH20 Plateau Pressure:  [15 cmH20-17 cmH20] 15 cmH20   Intake/Output Summary (Last 24 hours) at 09/17/2019 0740 Last data filed at 09/17/2019 0700 Gross per 24 hour  Intake 1820.79 ml  Output 1850 ml  Net -29.21 ml   Filed Weights   09/15/19 0419 09/16/19 0400 09/17/19 0424  Weight: 96.8 kg 99.4 kg 102.3 kg    Examination: General: Chronically ill appearing adult M, intubated, NAD.  HENT: NCAT. Pink mmm. Anicteric sclera. ETT secure  Lungs:Symmetrical chest expansion. No accessory use on PSV/CPAP. Diminished bibasilar sounds, no adventitious sounds  Cardiovascular: RRR s1s2 no rgm  Abdomen: Protuberant,  + bowel sounds, no signs of tenderness to palpation Extremities: No clubbing. RUE dependent edema. L hand is cool to touch but with BUE BLE cap refill < 3 seconds.  Trace BLE edema Neuro: Awake, calm, PERRLA 56mm. Moving LUE spontaneously. Withdraws BLE to painful stimuli. RUE flaccid  Skin: LUE with mixed open and closed blistering of medial aspect of bicep. BUE with scattered abrasions. Bruising dorsal surface L hand    Assessment & Plan:  History [name] is a 58 year old gentleman with alcohol abuse disorder and recurrent admissions for ethylene glycol toxicity who presents with:  Acute ischemic stroke -L frontal infarct; multiple punctate areas of restriction diffusion vs embolic infarcts -MRA head/neck without large vessel occlusion or high grade stenosis  New onset  tonic-clonic seizure -EEG without seizure; but with general and focal dysfunction-- L sided dysfunction and attenuation, R sided dysfunction, likely in setting of known stroke -Bubble study negative -LLE DVT on LE doppler P -neurology following, appreciate recs  -Keppra per neurology  -seizure precautions  Toxic  metabolic encephalopathy -ethylene glycol neg at [name] P -Delirium precautions - minimize sedation as able   AKI AGMA - suspected secondary to metformin use with AKI -has been transitioned from bicarb gtt to D5 in setting of hypernatremia P -nephrology consulted, rec continue supportive care as Cr is improving. Signed off -do not restart metformin   Acute hypoxemic respiratory failure, intubated for airway protection P -Daily SBT - mental status  -Lung protective ventilation.   DM2 P -Will increase SSI from moderate to resistant. BID Levemir 10 units in the morning, 12 units at night. Additional EN coverage   Hypernatremia, improving -146 to 133 11/30 Hypokalemia -3.4 11/30 P -will dc [facility]  -can continue 1/2 normal  - replace K -check ionized calcium, mag  History of alcohol use disorder P -supportive care -continue B vitamins  LLE DVT P -IV heparin  Thrombocytopenia, mild -Plt 104 on 11/30. Prior plt noted to have clumps P -trend CBC, close monitoring on heparin   Schizoaffective disorder -recently prescribed risperidone  -also on hydroxyzine 50 mg TID MDD -multiple suicide attempts historically  -is unclear if this presentation is linked to SI  P  -consider psych cs when stable   LUE blister P -qD and PRN clean with warm soapy water, cover with thin layer of bacitracin and xeroform   Best practice:  Diet: EN Pain/Anxiety/Delirium protocol (if indicated): PRN fentanyl VAP protocol (if indicated): Yes DVT prophylaxis: heparin gtt GI prophylaxis: PPI Glucose control: SSI resistant, EN coverage, plus Bid levemir  Mobility: bed rest Code Status: full code Family Communication: Directed to voicemail Disposition: ICU  Labs   CBC: Recent Labs  Lab 09/12/19 1316 09/13/19 0545 09/14/19 0718 09/15/19 0913 09/16/19 0306 09/17/19 0312  WBC 14.8* 9.0 8.2 8.5 6.9 5.6  NEUTROABS 12.2*  --  6.2  --   --   --   HGB 16.9 14.9 15.0 13.5 12.7* 10.3*  HCT  53.5* 46.4 44.7 39.5 37.6* 31.5*  MCV 95.5 93.2 92.5 90.6 91.7 93.5  PLT 224 138* PLATELET CLUMPS NOTED ON SMEAR, COUNT APPEARS DECREASED PLATELET CLUMPS NOTED ON SMEAR, COUNT APPEARS DECREASED PLATELET CLUMPS NOTED ON SMEAR, COUNT APPEARS DECREASED 104*    Basic Metabolic Panel: Recent Labs  Lab 09/13/19 1820 09/14/19 0718 09/14/19 1520 09/14/19 2105 09/15/19 0536 09/15/19 1713 09/16/19 0306 09/17/19 0312  NA 152* 148*  --   --  146*  --  146* 133*  K 4.3 4.2  --   --  3.4*  --  3.6 3.4*  CL 119* 116*  --   --  109  --  106 103  CO2 18* 18*  --   --  21*  --  25 23  GLUCOSE 186* 206*  --   --  332*  --  205* 234*  BUN 76* 66*  --   --  62*  --  55* 41*  CREATININE 2.59* 2.09*  --   --  1.55*  --  1.31* 1.05  CALCIUM 8.9 8.8*  --   --  8.6*  --  8.7* 7.2*  MG  --  2.3 2.2 2.3 2.2 2.2  --   --   PHOS  --   --  3.2  2.6 1.9* 3.3 2.8 2.3*   GFR: Estimated Creatinine Clearance: 93.8 mL/min (by C-G formula based on SCr of 1.05 mg/dL). Recent Labs  Lab 09/12/19 1316 09/12/19 1516 09/13/19 0545 09/14/19 0718 09/15/19 0913 09/16/19 0306 09/17/19 0312  PROCALCITON  --   --  0.33  --   --   --   --   WBC 14.8*  --  9.0 8.2 8.5 6.9 5.6  LATICACIDVEN 2.7* 0.9  --   --   --   --   --     Liver Function Tests: Recent Labs  Lab 09/12/19 1316  09/13/19 0545 09/13/19 0747 09/14/19 0718 09/16/19 0306 09/17/19 0312  AST 104*  --  123*  --  101*  --   --   ALT 65*  --  66*  --  57*  --   --   ALKPHOS 78  --  67  --  49  --   --   BILITOT 1.5*  --  1.1  --  1.3*  --   --   PROT 8.5*  --  6.9  --  5.7*  --   --   ALBUMIN 4.6   < > 3.5 3.1* 2.7* 2.5* 1.9*   < > = values in this interval not displayed.   No results for input(s): LIPASE, AMYLASE in the last 168 hours. Recent Labs  Lab 09/12/19 1325  AMMONIA 29    ABG    Component Value Date/Time   PHART 7.377 09/13/2019 0559   PCO2ART 32.5 09/13/2019 0559   PO2ART 118 (H) 09/13/2019 0559   HCO3 18.4 (L) 09/13/2019 0559    TCO2 19 (L) 05/30/2019 0535   ACIDBASEDEF 5.6 (H) 09/13/2019 0559   O2SAT 98.0 09/13/2019 0559     Coagulation Profile: Recent Labs  Lab 09/12/19 1316  INR 1.1    Cardiac Enzymes: No results for input(s): CKTOTAL, CKMB, CKMBINDEX, TROPONINI in the last 168 hours.  HbA1C: HB A1C (BAYER DCA - WAIVED)  Date/Time Value Ref Range Status  11/11/2016 11:32 AM 7.6 (H) <7.0 % Final    Comment:                                          Diabetic Adult            <7.0                                       Healthy Adult        4.3 - 5.7                                                           (DCCT/NGSP) American Diabetes Association's Summary of Glycemic Recommendations for Adults with Diabetes: Hemoglobin A1c <7.0%. More stringent glycemic goals (A1c <6.0%) may further reduce complications at the cost of increased risk of hypoglycemia.    Hgb A1c MFr Bld  Date/Time Value Ref Range Status  09/13/2019 05:45 AM 8.1 (H) 4.8 - 5.6 % Final    Comment:    (NOTE) Pre diabetes:  5.7%-6.4% Diabetes:              >6.4% Glycemic control for   <7.0% adults with diabetes   05/03/2019 09:40 AM 8.8 (H) 4.8 - 5.6 % Final    Comment:    (NOTE) Pre diabetes:          5.7%-6.4% Diabetes:              >6.4% Glycemic control for   <7.0% adults with diabetes     CBG: Recent Labs  Lab 09/16/19 1643 09/16/19 1924 09/16/19 2312 09/17/19 0322 09/17/19 0733  GLUCAP 290* 283* 276* 246* 258*    CRITICAL CARE Performed by: Cristal Generous   Total critical care time: 40 minutes   Critical care time was exclusive of separately billable procedures and treating other patients. Critical care was necessary to treat or prevent imminent or life-threatening deterioration.  Critical care was time spent personally by me on the following activities: development of treatment plan with patient and/or surrogate as well as nursing, discussions with consultants, evaluation of patient's response to  treatment, examination of patient, obtaining history from patient or surrogate, ordering and performing treatments and interventions, ordering and review of laboratory studies, ordering and review of radiographic studies, pulse oximetry and re-evaluation of patient's condition.  Eliseo Gum MSN, AGACNP-BC [place] [id number] If no answer, [phone] 09/17/2019, 7:40 AM

## 2019-09-17 NOTE — Progress Notes (Signed)
[facility] for heparin Indication: DVT and stroke  Allergies  Allergen Reactions  . Quetiapine Anaphylaxis  . Seroquel [Quetiapine Fumarate] Anxiety and Other (See Comments)    Reaction:  Nightmares   . Trazodone Anaphylaxis  . Trazodone And Nefazodone Anxiety and Other (See Comments)    Other reaction(s): Unknown Nightmares Reaction:  Nightmares   . Vistaril [Hydroxyzine Hcl] Nausea And Vomiting  . Haldol [Haloperidol Lactate] Anxiety and Other (See Comments)    Reaction:  Nightmares     Patient Measurements: Height: 6' (182.9 cm) Weight: 225 lb 8.5 oz (102.3 kg) IBW/kg (Calculated) : 77.6 Heparin Dosing Weight: 97.7 kg  Vital Signs: Temp: 98.3 F (36.8 C) (11/30 0800) Temp Source: Oral (11/30 0800) BP: 136/62 (11/30 0748) Pulse Rate: 90 (11/30 0748)  Labs: Recent Labs    09/15/19 0536  09/15/19 0913 09/16/19 0306 09/17/19 0005 09/17/19 0312 09/17/19 0658  HGB  --    < > 13.5 12.7*  --  10.3*  --   HCT  --   --  39.5 37.6*  --  31.5*  --   PLT  --   --  PLATELET CLUMPS NOTED ON SMEAR, COUNT APPEARS DECREASED PLATELET CLUMPS NOTED ON SMEAR, COUNT APPEARS DECREASED  --  104*  --   HEPARINUNFRC  --   --   --   --  0.71*  --  0.65  CREATININE 1.55*  --   --  1.31*  --  1.05  --    < > = values in this interval not displayed.    Estimated Creatinine Clearance: 93.8 mL/min (by C-G formula based on SCr of 1.05 mg/dL).   Assessment: 89 yom presenting with acute onset of encephalopathy - found to have seizure like activity and CT head showing infarcts in L hemisphere. Also duplex finding acute DVT in L peroneal, common femoral, proximal profunda, and popliteal veins. No AC PTA.   Heparin level supratherapeutic (0.65) for lower goal with acute CVA. No issues with line or bleeding reported per RN.   Goal of Therapy:  Heparin level 0.3-0.5 units/ml Monitor platelets by anticoagulation protocol: Yes   Plan:  Decrease heparin  infusion to 800 units/hr F/u 6 hr heparin level Monitor daily heparin level and CBC, s/sx bleeding   Elicia Lamp, PharmD, BCPS Please check AMION for all [facility] contact numbers Clinical Pharmacist 09/17/2019 8:14 AM

## 2019-09-18 ENCOUNTER — Inpatient Hospital Stay (HOSPITAL_COMMUNITY): Payer: Medicare Other

## 2019-09-18 DIAGNOSIS — I639 Cerebral infarction, unspecified: Secondary | ICD-10-CM

## 2019-09-18 DIAGNOSIS — G9340 Encephalopathy, unspecified: Secondary | ICD-10-CM | POA: Diagnosis not present

## 2019-09-18 DIAGNOSIS — N179 Acute kidney failure, unspecified: Secondary | ICD-10-CM | POA: Diagnosis not present

## 2019-09-18 DIAGNOSIS — J9600 Acute respiratory failure, unspecified whether with hypoxia or hypercapnia: Secondary | ICD-10-CM

## 2019-09-18 LAB — GLUCOSE, CAPILLARY
Glucose-Capillary: 211 mg/dL — ABNORMAL HIGH (ref 70–99)
Glucose-Capillary: 248 mg/dL — ABNORMAL HIGH (ref 70–99)
Glucose-Capillary: 269 mg/dL — ABNORMAL HIGH (ref 70–99)
Glucose-Capillary: 284 mg/dL — ABNORMAL HIGH (ref 70–99)
Glucose-Capillary: 303 mg/dL — ABNORMAL HIGH (ref 70–99)
Glucose-Capillary: 224 mg/dL — ABNORMAL HIGH (ref 70–99)
Glucose-Capillary: 275 mg/dL — ABNORMAL HIGH (ref 70–99)

## 2019-09-18 LAB — COMPREHENSIVE METABOLIC PANEL
ALT: 38 U/L (ref 0–44)
AST: 28 U/L (ref 15–41)
Albumin: 2.3 g/dL — ABNORMAL LOW (ref 3.5–5.0)
Alkaline Phosphatase: 55 U/L (ref 38–126)
Anion gap: 8 (ref 5–15)
BUN: 40 mg/dL — ABNORMAL HIGH (ref 6–20)
CO2: 24 mmol/L (ref 22–32)
Calcium: 8.6 mg/dL — ABNORMAL LOW (ref 8.9–10.3)
Chloride: 109 mmol/L (ref 98–111)
Creatinine, Ser: 1.1 mg/dL (ref 0.61–1.24)
GFR calc Af Amer: >60 mL/min (ref >60)
GFR calc non Af Amer: >60 mL/min (ref >60)
Glucose, Bld: 284 mg/dL — ABNORMAL HIGH (ref 70–99)
Potassium: 4.9 mmol/L (ref 3.5–5.1)
Sodium: 141 mmol/L (ref 135–145)
Total Bilirubin: 0.4 mg/dL (ref 0.3–1.2)
Total Protein: 5.1 g/dL — ABNORMAL LOW (ref 6.5–8.1)

## 2019-09-18 LAB — CBC
HCT: 33.5 % — ABNORMAL LOW (ref 39.0–52.0)
Hemoglobin: 11.2 g/dL — ABNORMAL LOW (ref 13.0–17.0)
MCH: 31.1 pg (ref 26.0–34.0)
MCHC: 33.4 g/dL (ref 30.0–36.0)
MCV: 93.1 fL (ref 80.0–100.0)
Platelets: 125 10*3/uL — ABNORMAL LOW (ref 150–400)
RBC: 3.6 MIL/uL — ABNORMAL LOW (ref 4.22–5.81)
RDW: 13.2 % (ref 11.5–15.5)
WBC: 7.5 10*3/uL (ref 4.0–10.5)
nRBC: 0 % (ref 0.0–0.2)

## 2019-09-18 LAB — HEPARIN LEVEL (UNFRACTIONATED)
Heparin Unfractionated: 0.2 IU/mL — ABNORMAL LOW (ref 0.30–0.70)
Heparin Unfractionated: 0.3 IU/mL (ref 0.30–0.70)
Heparin Unfractionated: 0.3 IU/mL (ref 0.30–0.70)
Heparin Unfractionated: 0.31 IU/mL (ref 0.30–0.70)

## 2019-09-18 LAB — CALCIUM, IONIZED: Calcium, Ionized, Serum: 4.7 mg/dL (ref 4.5–5.6)

## 2019-09-18 LAB — MAGNESIUM: Magnesium: 2 mg/dL (ref 1.7–2.4)

## 2019-09-18 LAB — PHOSPHORUS: Phosphorus: 3.2 mg/dL (ref 2.5–4.6)

## 2019-09-18 MED ORDER — MIDAZOLAM HCL 2 MG/2ML IJ SOLN
INTRAMUSCULAR | Status: AC
  Administered 2019-09-18: 4 mg via INTRAVENOUS
  Filled 2019-09-18: qty 2

## 2019-09-18 MED ORDER — ASPIRIN 325 MG PO TABS
325.0000 mg | ORAL_TABLET | Freq: Every day | ORAL | Status: DC
  Administered 2019-09-18 – 2019-09-27 (×9): 325 mg
  Filled 2019-09-18 (×9): qty 1

## 2019-09-18 MED ORDER — ACETAMINOPHEN 325 MG PO TABS
650.0000 mg | ORAL_TABLET | Freq: Four times a day (QID) | ORAL | Status: DC | PRN
  Administered 2019-09-21 – 2019-09-27 (×2): 650 mg
  Filled 2019-09-18 (×2): qty 2

## 2019-09-18 MED ORDER — LORAZEPAM 2 MG/ML IJ SOLN
2.0000 mg | INTRAMUSCULAR | Status: DC | PRN
  Administered 2019-09-19 – 2019-09-21 (×4): 2 mg via INTRAVENOUS
  Filled 2019-09-18 (×8): qty 1

## 2019-09-18 MED ORDER — INSULIN DETEMIR 100 UNIT/ML ~~LOC~~ SOLN
18.0000 [IU] | Freq: Two times a day (BID) | SUBCUTANEOUS | Status: DC
  Administered 2019-09-18 (×2): 18 [IU] via SUBCUTANEOUS
  Filled 2019-09-18 (×4): qty 0.18

## 2019-09-18 MED ORDER — LACTATED RINGERS IV SOLN
INTRAVENOUS | Status: DC
  Administered 2019-09-18 – 2019-09-20 (×2): via INTRAVENOUS
  Filled 2019-09-18: qty 1000

## 2019-09-18 MED ORDER — MIDAZOLAM HCL 2 MG/2ML IJ SOLN
2.0000 mg | Freq: Once | INTRAMUSCULAR | Status: AC
  Administered 2019-09-18: 19:00:00 4 mg via INTRAVENOUS

## 2019-09-18 MED ORDER — MIDAZOLAM HCL 2 MG/2ML IJ SOLN
INTRAMUSCULAR | Status: AC
  Filled 2019-09-18: qty 2

## 2019-09-18 MED ORDER — FOLIC ACID 1 MG PO TABS
1.0000 mg | ORAL_TABLET | Freq: Every day | ORAL | Status: DC
  Administered 2019-09-19 – 2019-09-27 (×8): 1 mg
  Filled 2019-09-18 (×8): qty 1

## 2019-09-18 MED ORDER — POLYETHYLENE GLYCOL 3350 17 G PO PACK
17.0000 g | PACK | Freq: Every day | ORAL | Status: DC
  Administered 2019-09-19 – 2019-09-27 (×8): 17 g
  Filled 2019-09-18 (×8): qty 1

## 2019-09-18 MED ORDER — MIDAZOLAM HCL 2 MG/2ML IJ SOLN: 2.0000 mg | Freq: Once | INTRAMUSCULAR | Status: DC

## 2019-09-18 MED ORDER — NYSTATIN 100000 UNIT/ML MT SUSP
5.0000 mL | Freq: Four times a day (QID) | OROMUCOSAL | Status: DC
  Administered 2019-09-18 – 2019-09-27 (×33): 500000 [IU]
  Filled 2019-09-18 (×34): qty 5

## 2019-09-18 NOTE — Progress Notes (Signed)
NAME:  [name], MRN:  [id number], DOB:  07-12-1961, LOS: 6 ADMISSION DATE:  09/12/2019, CONSULTATION DATE: 11/25 REFERRING MD:  Audria Nine, DO CHIEF COMPLAINT: Toxic ingestion, altered mental status  Brief History   Mr. Hudecek is a 58 year old gentleman with a history of alcohol abuse disorder, schizophrenia, diabetes who presented from jail for drunk and disorderly conduct and presented to [facility] emergency department on 11/25.  In jail he was found to be obtunded and tachypneic and was therefore brought to the ED. given his history of ethylene glycol toxicity, multiple suicide attempts, and labs consistent with metabolic acidosis and acute kidney injury, he was treated empirically as ethylene glycol toxicity.  He was intubated in the [facility] on admission and is now 50M ICU.  Over the course of his hospital stay he was passing his SBT's, but was unable to be extubated for mental status.  His urine and serum testing for ethylene glycol was a send out lab at [facility], and was negative.  His urine was positive only for ketones.  Fomepizole was stopped after 48 hours, and once his acid-base disorder had resolved.  Consults:  Nephrology, off neurology  Micro Data:  11/25, Covid negative 11/25 BCx> no growth  11/26 sputum culture rare gram-positive cocci 11/26 MRSA PCR negative 11/26 tracheal aspirate> normal flora   Significant Diagnostic Tests:  11/25 CT H> no acute intracranial abnormality 11/28 CT H> L frontal hypodensity 11/29 MRI/A h/n>L hemispheric abnormal diffusion restriction. MRA without large vessel occlusion or stenosis however exam limited by motion degradation  11/29 ECHO> LVEF 60-65%, no septal defect 11/30 CT angio head/neck> Progressive patchy hypodensity L frontal lobe, high-grade R ICA stenosis, Stenosis vs subtotal occlusion of non-dominant proximal R vertebral artery, no large vessel occlusion  12/1 CXR> Low lung volumes, no focal ASD   Antimicrobials:  S/p  metronidazole x1 Received 48 hours empiric vancomycin and cefepime  Interim history/subjective:  Went for CT angio head/neck overnight NAEO Weaning vent again this morning PSV/CPAP 5/5   Objective   Blood pressure (!) 152/68, pulse 91, temperature 99.6 F (37.6 C), temperature source Oral, resp. rate 17, height 6' (1.829 m), weight 103.7 kg, SpO2 100 %.    Vent Mode: PRVC FiO2 (%):  [30 %] 30 % Set Rate:  [16 bmp] 16 bmp Vt Set:  [600 mL] 600 mL PEEP:  [5 cmH20] 5 cmH20 Pressure Support:  [5 cmH20-10 cmH20] 5 cmH20 Plateau Pressure:  [18 cmH20] 18 cmH20   Intake/Output Summary (Last 24 hours) at 09/18/2019 0747 Last data filed at 09/18/2019 0500 Gross per 24 hour  Intake 1766 ml  Output 2200 ml  Net -434 ml   Filed Weights   09/16/19 0400 09/17/19 0424 09/18/19 0237  Weight: 99.4 kg 102.3 kg 103.7 kg    Examination: General: Chronically and critically ill appearing older adult M, intubated NAD  HENT: NCAT ETT secure anicteric sclera trachea midline  Lungs: Scattered rhonchi. Even, unlabored respirations on PSV/CPAP  Cardiovascular: RRR s1s2 no rgm. Cap refill < 3 seconds  Abdomen: Protuberant, soft, + bowel sounds  Extremities: Dependent RUE edema. Trace BLE edema. No clubbing Neuro: Awakens spontaneously and to stimulation, is not following commands. PERRL 48mm. Moving LUE spontaneously. RUE flaccid. Withdraws BLE to pain  Skin: LUE blister dressing c/d/i. R hand bruising. Scattered abrasions BLE   Assessment & Plan:   Acute ischemic stroke -L frontal infarct; multiple punctate areas of restriction diffusion vs embolic infarcts -High-grade R ICA stenosis, severe stenosis  vs subtotal occlusion of non-dominant proximal R vertebral artery  -Lower extremity DVT  New onset tonic-clonic seizure -EEG without seizure; but with general and focal dysfunction-- L sided dysfunction and attenuation, R sided dysfunction, likely in setting of known stroke P -neurology following,  appreciate recs  -Keppra per neurology  -seizure precautions -IV heparin as below  Acute hypoxemic respiratory failure -tolerates PSV/CPAP weans well P -continue efforts at weaning vent -mental status prohibits extubation efforts -continue pulm hygiene  -AM CXR  Toxic metabolic encephalopathy -ethylene glycol neg at [facility] -acetone level mildly elevated  P - Delirium precautions - minimize sedation as able   AKI, improved P -do not restart metformin   DM2 -persistent hyperglycemia P -Resistant SSI  -increasing BID levimir to 18 BID -EN coverage  Hx EtOH abuse P -supportive care -continue B vitamins  LLE DVT P -IV heparin per pharmacy   Thrombocytopenia, mild -stable  P -trend CBC, close monitoring on heparin   Schizoaffective disorder -recently prescribed risperidone  -also on hydroxyzine 50 mg TID MDD -multiple suicide attempts historically  -is unclear if this presentation is linked to SI  P  -engage psych when progressed further in illness course  LUE blister P -qD and PRN clean with warm soapy water, cover with thin layer of bacitracin and xeroform   Best practice:  Diet: EN Pain/Anxiety/Delirium protocol (if indicated): PRN fentanyl VAP protocol (if indicated): Yes DVT prophylaxis: heparin gtt GI prophylaxis: PPI Glucose control: SSI resistant, BID Levimir, EN coverage  Mobility: bed rest Code Status: full code Family Communication: Attempts have been directed to voicemail, will continue to try to reach  Disposition: ICU   Labs   CBC: Recent Labs  Lab 09/12/19 1316  09/14/19 0718 09/15/19 0913 09/16/19 0306 09/17/19 0312 09/18/19 0345  WBC 14.8*   < > 8.2 8.5 6.9 5.6 7.5  NEUTROABS 12.2*  --  6.2  --   --   --   --   HGB 16.9   < > 15.0 13.5 12.7* 10.3* 11.2*  HCT 53.5*   < > 44.7 39.5 37.6* 31.5* 33.5*  MCV 95.5   < > 92.5 90.6 91.7 93.5 93.1  PLT 224   < > PLATELET CLUMPS NOTED ON SMEAR, COUNT APPEARS DECREASED PLATELET CLUMPS  NOTED ON SMEAR, COUNT APPEARS DECREASED PLATELET CLUMPS NOTED ON SMEAR, COUNT APPEARS DECREASED 104* 125*   < > = values in this interval not displayed.    Basic Metabolic Panel: Recent Labs  Lab 09/14/19 0718  09/14/19 2105 09/15/19 0536 09/15/19 1713 09/16/19 0306 09/17/19 0312 09/17/19 1023 09/18/19 0345  NA 148*  --   --  146*  --  146* 133*  --  141  K 4.2  --   --  3.4*  --  3.6 3.4*  --  4.9  CL 116*  --   --  109  --  106 103  --  109  CO2 18*  --   --  21*  --  25 23  --  24  GLUCOSE 206*  --   --  332*  --  205* 234*  --  284*  BUN 66*  --   --  62*  --  55* 41*  --  40*  CREATININE 2.09*  --   --  1.55*  --  1.31* 1.05  --  1.10  CALCIUM 8.8*  --   --  8.6*  --  8.7* 7.2*  --  8.6*  MG 2.3   < >  2.3 2.2 2.2  --   --  2.0 2.0  PHOS  --    < > 2.6 1.9* 3.3 2.8 2.3*  --  3.2   < > = values in this interval not displayed.   GFR: Estimated Creatinine Clearance: 90 mL/min (by C-G formula based on SCr of 1.1 mg/dL). Recent Labs  Lab 09/12/19 1316 09/12/19 1516 09/13/19 0545  09/15/19 0913 09/16/19 0306 09/17/19 0312 09/18/19 0345  PROCALCITON  --   --  0.33  --   --   --   --   --   WBC 14.8*  --  9.0   < > 8.5 6.9 5.6 7.5  LATICACIDVEN 2.7* 0.9  --   --   --   --   --   --    < > = values in this interval not displayed.    Liver Function Tests: Recent Labs  Lab 09/12/19 1316  09/13/19 0545 09/13/19 0747 09/14/19 0718 09/16/19 0306 09/17/19 0312 09/18/19 0345  AST 104*  --  123*  --  101*  --   --  28  ALT 65*  --  66*  --  57*  --   --  38  ALKPHOS 78  --  67  --  49  --   --  55  BILITOT 1.5*  --  1.1  --  1.3*  --   --  0.4  PROT 8.5*  --  6.9  --  5.7*  --   --  5.1*  ALBUMIN 4.6   < > 3.5 3.1* 2.7* 2.5* 1.9* 2.3*   < > = values in this interval not displayed.   No results for input(s): LIPASE, AMYLASE in the last 168 hours. Recent Labs  Lab 09/12/19 1325  AMMONIA 29    ABG    Component Value Date/Time   PHART 7.377 09/13/2019 0559    PCO2ART 32.5 09/13/2019 0559   PO2ART 118 (H) 09/13/2019 0559   HCO3 18.4 (L) 09/13/2019 0559   TCO2 19 (L) 05/30/2019 0535   ACIDBASEDEF 5.6 (H) 09/13/2019 0559   O2SAT 98.0 09/13/2019 0559     Coagulation Profile: Recent Labs  Lab 09/12/19 1316  INR 1.1    Cardiac Enzymes: No results for input(s): CKTOTAL, CKMB, CKMBINDEX, TROPONINI in the last 168 hours.  HbA1C: HB A1C (BAYER DCA - WAIVED)  Date/Time Value Ref Range Status  11/11/2016 11:32 AM 7.6 (H) <7.0 % Final    Comment:                                          Diabetic Adult            <7.0                                       Healthy Adult        4.3 - 5.7                                                           (DCCT/NGSP) American Diabetes Association's Summary of Glycemic Recommendations for Adults with  Diabetes: Hemoglobin A1c <7.0%. More stringent glycemic goals (A1c <6.0%) may further reduce complications at the cost of increased risk of hypoglycemia.    Hgb A1c MFr Bld  Date/Time Value Ref Range Status  09/13/2019 05:45 AM 8.1 (H) 4.8 - 5.6 % Final    Comment:    (NOTE) Pre diabetes:          5.7%-6.4% Diabetes:              >6.4% Glycemic control for   <7.0% adults with diabetes   05/03/2019 09:40 AM 8.8 (H) 4.8 - 5.6 % Final    Comment:    (NOTE) Pre diabetes:          5.7%-6.4% Diabetes:              >6.4% Glycemic control for   <7.0% adults with diabetes     CBG: Recent Labs  Lab 09/17/19 1512 09/17/19 2002 09/17/19 2334 09/18/19 0348 09/18/19 0726  GLUCAP 283* 269* 222* 248* 269*    CRITICAL CARE Performed by: Cristal Generous   Total critical care time: 38 minutes  Critical care time was exclusive of separately billable procedures and treating other patients. Critical care was necessary to treat or prevent imminent or life-threatening deterioration.  Critical care was time spent personally by me on the following activities: development of treatment plan with patient and/or  surrogate as well as nursing, discussions with consultants, evaluation of patient's response to treatment, examination of patient, obtaining history from patient or surrogate, ordering and performing treatments and interventions, ordering and review of laboratory studies, ordering and review of radiographic studies, pulse oximetry and re-evaluation of patient's condition.  Eliseo Gum MSN, AGACNP-BC [place] [id number] If no answer, [phone] 09/18/2019, 7:47 AM

## 2019-09-18 NOTE — Progress Notes (Signed)
[place] for heparin Indication: DVT and stroke  Allergies  Allergen Reactions  . Quetiapine Anaphylaxis  . Seroquel [Quetiapine Fumarate] Anxiety and Other (See Comments)    Reaction:  Nightmares   . Trazodone Anaphylaxis  . Trazodone And Nefazodone Anxiety and Other (See Comments)    Other reaction(s): Unknown Nightmares Reaction:  Nightmares   . Vistaril [Hydroxyzine Hcl] Nausea And Vomiting  . Haldol [Haloperidol Lactate] Anxiety and Other (See Comments)    Reaction:  Nightmares     Patient Measurements: Height: 6' (182.9 cm) Weight: 228 lb 9.9 oz (103.7 kg) IBW/kg (Calculated) : 77.6 Heparin Dosing Weight: 97.7 kg  Vital Signs: Temp: 97.9 F (36.6 C) (12/01 0730) Temp Source: Axillary (12/01 0730) BP: 135/80 (12/01 0800) Pulse Rate: 103 (12/01 0800)  Labs: Recent Labs    09/16/19 0306  09/17/19 0312 09/17/19 0658 09/17/19 1530 09/18/19 0000 09/18/19 0345  HGB 12.7*  --  10.3*  --   --   --  11.2*  HCT 37.6*  --  31.5*  --   --   --  33.5*  PLT PLATELET CLUMPS NOTED ON SMEAR, COUNT APPEARS DECREASED  --  104*  --   --   --  125*  HEPARINUNFRC  --    < >  --  0.65 0.59 0.31  --   CREATININE 1.31*  --  1.05  --   --   --  1.10   < > = values in this interval not displayed.    Estimated Creatinine Clearance: 90 mL/min (by C-G formula based on SCr of 1.1 mg/dL).   Assessment: 63 yom presenting with acute onset of encephalopathy - found to have seizure like activity and CT head showing infarcts in L hemisphere. Also duplex finding acute DVT in L peroneal, common femoral, proximal profunda, and popliteal veins. No AC PTA.  Pharmacy consulted to dose heparin.  Heparin level now subtherapeutic (0.2), targeting lower goal per protocol with acute CVA. Hg low but stable, plt improved to 125. No bleeding or issues with infusion per discussion with RN.   Goal of Therapy:  Heparin level 0.3-0.5 units/ml Monitor platelets by  anticoagulation protocol: Yes   Plan:  Increase heparin infusion to 850 units/hr 6h heparin level Monitor daily heparin level and CBC, s/sx bleeding   Elicia Lamp, PharmD, BCPS Please check AMION for all [facility] contact numbers Clinical Pharmacist 09/18/2019 8:41 AM

## 2019-09-18 NOTE — Progress Notes (Signed)
[facility] for heparin Indication: DVT and stroke  Allergies  Allergen Reactions  . Quetiapine Anaphylaxis  . Seroquel [Quetiapine Fumarate] Anxiety and Other (See Comments)    Reaction:  Nightmares   . Trazodone Anaphylaxis  . Trazodone And Nefazodone Anxiety and Other (See Comments)    Other reaction(s): Unknown Nightmares Reaction:  Nightmares   . Vistaril [Hydroxyzine Hcl] Nausea And Vomiting  . Haldol [Haloperidol Lactate] Anxiety and Other (See Comments)    Reaction:  Nightmares     Patient Measurements: Height: 6' (182.9 cm) Weight: 228 lb 9.9 oz (103.7 kg) IBW/kg (Calculated) : 77.6 Heparin Dosing Weight: 97.7 kg  Vital Signs: Temp: 98.8 F (37.1 C) (12/01 1531) Temp Source: Oral (12/01 1531) BP: 156/83 (12/01 1516) Pulse Rate: 87 (12/01 1500)  Labs: Recent Labs    09/16/19 0306  09/17/19 0312  09/18/19 0000 09/18/19 0345 09/18/19 0817 09/18/19 1545  HGB 12.7*  --  10.3*  --   --  11.2*  --   --   HCT 37.6*  --  31.5*  --   --  33.5*  --   --   PLT PLATELET CLUMPS NOTED ON SMEAR, COUNT APPEARS DECREASED  --  104*  --   --  125*  --   --   HEPARINUNFRC  --    < >  --    < > 0.31  --  0.20* 0.30  CREATININE 1.31*  --  1.05  --   --  1.10  --   --    < > = values in this interval not displayed.    Estimated Creatinine Clearance: 90 mL/min (by C-G formula based on SCr of 1.1 mg/dL).   Assessment: 58 yr old male presented with acute onset of encephalopathy - found to have seizure-like activity, with CT head showing infarcts in L hemisphere. Also, duplex finding acute DVT in L peroneal, common femoral, proximal profunda, and popliteal veins. No anticoagulation PTA.  Pharmacy consulted to dose heparin.  Heparin level drawn 6 hrs after increasing heparin infusion to 850 units/hr earlier today is 0.30 units/ml, which is within the goal range for this patient (targeting lower goal per protocol with acute CVA). Hgb low but  stable, plt improved to 125. No bleeding or issues with infusion, per discussion with RN.   Goal of Therapy:  Heparin level 0.3-0.5 units/ml Monitor platelets by anticoagulation protocol: Yes   Plan:  Continue heparin infusion at 850 units/hr Check 6-hr heparin level Monitor daily heparin level and CBC Monitor for signs/symptoms of bleeding  Gillermina Hu, PharmD, BCPS, [facility] Clinical Pharmacist 09/18/2019 4:30 PM

## 2019-09-18 NOTE — Progress Notes (Signed)
EEG complete - results pending 

## 2019-09-18 NOTE — Progress Notes (Signed)
Pt seized at shift change, lasted 8 minutes, gaze deviated up and to the left with facial twitching. CCM notified, gave 4mg  versed. Neuro arrived to bedside. Will continue to monitor.

## 2019-09-18 NOTE — Progress Notes (Signed)
[place] for heparin Indication: DVT and stroke  Allergies  Allergen Reactions  . Quetiapine Anaphylaxis  . Seroquel [Quetiapine Fumarate] Anxiety and Other (See Comments)    Reaction:  Nightmares   . Trazodone Anaphylaxis  . Trazodone And Nefazodone Anxiety and Other (See Comments)    Other reaction(s): Unknown Nightmares Reaction:  Nightmares   . Vistaril [Hydroxyzine Hcl] Nausea And Vomiting  . Haldol [Haloperidol Lactate] Anxiety and Other (See Comments)    Reaction:  Nightmares     Patient Measurements: Height: 6' (182.9 cm) Weight: 225 lb 8.5 oz (102.3 kg) IBW/kg (Calculated) : 77.6 Heparin Dosing Weight: 97.7 kg  Vital Signs: Temp: 99.6 F (37.6 C) (11/30 2334) Temp Source: Oral (11/30 2334) BP: 162/88 (12/01 0000) Pulse Rate: 95 (12/01 0000)  Labs: Recent Labs    09/15/19 0536  09/15/19 0913 09/16/19 0306  09/17/19 0312 09/17/19 0658 09/17/19 1530 09/18/19 0000  HGB  --    < > 13.5 12.7*  --  10.3*  --   --   --   HCT  --   --  39.5 37.6*  --  31.5*  --   --   --   PLT  --   --  PLATELET CLUMPS NOTED ON SMEAR, COUNT APPEARS DECREASED PLATELET CLUMPS NOTED ON SMEAR, COUNT APPEARS DECREASED  --  104*  --   --   --   HEPARINUNFRC  --   --   --   --    < >  --  0.65 0.59 0.31  CREATININE 1.55*  --   --  1.31*  --  1.05  --   --   --    < > = values in this interval not displayed.    Estimated Creatinine Clearance: 93.8 mL/min (by C-G formula based on SCr of 1.05 mg/dL).   Assessment: 11 yom presenting with acute onset of encephalopathy - found to have seizure like activity and CT head showing infarcts in L hemisphere. Also duplex finding acute DVT in L peroneal, common femoral, proximal profunda, and popliteal veins. No AC PTA.   Heparin level now therapeutic (0.31) for lower goal with acute CVA.   Goal of Therapy:  Heparin level 0.3-0.5 units/ml Monitor platelets by anticoagulation protocol: Yes   Plan:   Continue heparin infusion at 700 units/hr Monitor daily heparin level and CBC, s/sx bleeding  Sherlon Handing, PharmD, BCPS Please see amion for complete clinical pharmacist phone list 09/18/2019 12:46 AM

## 2019-09-18 NOTE — Progress Notes (Addendum)
STROKE TEAM PROGRESS NOTE   INTERVAL HISTORY RN and RT at bedside.  RN reported that patient had seizure activity at 7 PM with eye fluttering and gaze to the upper left.  Received 4 mg of Versed, episode resolved.  Currently patient sleepy, drowsy on vent, without sedation.  OBJECTIVE Vitals:   09/18/19 0900 09/18/19 1000 09/18/19 1100 09/18/19 1104  BP: 140/80 (!) 158/78 (!) 152/87 (!) 152/87  Pulse:  97 95   Resp: 19 19 17    Temp:      TempSrc:      SpO2:  100% 100% 100%  Weight:      Height:        CBC:  Recent Labs  Lab 09/12/19 1316  09/14/19 0718  09/17/19 0312 09/18/19 0345  WBC 14.8*   < > 8.2   < > 5.6 7.5  NEUTROABS 12.2*  --  6.2  --   --   --   HGB 16.9   < > 15.0   < > 10.3* 11.2*  HCT 53.5*   < > 44.7   < > 31.5* 33.5*  MCV 95.5   < > 92.5   < > 93.5 93.1  PLT 224   < > PLATELET CLUMPS NOTED ON SMEAR, COUNT APPEARS DECREASED   < > 104* 125*   < > = values in this interval not displayed.    Basic Metabolic Panel:  Recent Labs  Lab 09/17/19 0312 09/17/19 1023 09/18/19 0345  NA 133*  --  141  K 3.4*  --  4.9  CL 103  --  109  CO2 23  --  24  GLUCOSE 234*  --  284*  BUN 41*  --  40*  CREATININE 1.05  --  1.10  CALCIUM 7.2*  --  8.6*  MG  --  2.0 2.0  PHOS 2.3*  --  3.2    Lipid Panel:     Component Value Date/Time   CHOL 122 09/17/2019 0312   CHOL 184 11/11/2016 1007   TRIG 158 (H) 09/17/2019 0312   HDL 21 (L) 09/17/2019 0312   HDL 25 (L) 11/11/2016 1007   CHOLHDL 5.8 09/17/2019 0312   VLDL 32 09/17/2019 0312   LDLCALC 69 09/17/2019 0312   LDLCALC 118 (H) 11/11/2016 1007   HgbA1c:  Lab Results  Component Value Date   HGBA1C 8.1 (H) 09/13/2019   Urine Drug Screen:     Component Value Date/Time   LABOPIA NONE DETECTED 09/12/2019 1326   COCAINSCRNUR NONE DETECTED 09/12/2019 1326   COCAINSCRNUR NONE DETECTED 12/09/2015 2242   LABBENZ NONE DETECTED 09/12/2019 1326   AMPHETMU NONE DETECTED 09/12/2019 1326   THCU NONE DETECTED  09/12/2019 1326   LABBARB NONE DETECTED 09/12/2019 1326    Alcohol Level     Component Value Date/Time   ETH <10 09/12/2019 1326    IMAGING  Ct Angio Head W Or Wo Contrast Ct Angio Neck W Or Wo Contrast 09/17/2019 1. Negative for large vessel occlusion. Diminutive appearance of the intracranial circulation but no circle-of-Willis branch occlusion or significant intracranial stenosis identified. 2. Positive for severe stenosis vs. subtotal occlusion of the non-dominant proximal Right Vertebral Artery. This right vertebral terminates in PICA. 3. Positive also for high-grade Right ICA origin stenosis due to bulky calcified plaque, although the degree of this stenosis is numerically underestimated due to diminutive downstream right ICA (3 mm diameter). 4. The left ICA is similarly diminutive, but with no superimposed stenosis despite atherosclerosis. 5.  Progressive patchy hypodensity in the left frontal lobe since 09/15/2019 suggests ischemia rather than postictal changes on the MRI yesterday. No associated hemorrhage or mass effect. 6. No new intracranial abnormality.   Dg Chest Port 1 View 09/18/2019 1. No acute cardiopulmonary disease or significant interval change. 2. Stable support apparatus. 3. Low lung volumes.    Ct Head Wo Contrast 09/16/2019 IMPRESSION:  1. No acute intracranial hemorrhage.  2. Ill-defined areas of hypodensity in the left frontal convexity and left basal ganglia, new since the prior CT and concerning for acute infarct. Further evaluation with MRI is recommended.   Mr Brain 39 Contrast Mr Angio Head Wo Contrast Mr Angio Neck Wo Contrast 09/16/2019 IMPRESSION:  1. Large amount of abnormal diffusion restriction, likely postictal, within the left hemisphere, predominantly cortical/subcortical.  2. Motion degraded MRA of the head and neck. No emergent large vessel occlusion or high-grade stenosis.   Vas [place] Lower Extremity Venous (dvt) 09/16/2019 Summary:  Right:  There is no evidence of deep vein thrombosis in the lower extremity. No cystic structure found in the popliteal fossa.  Left: Findings consistent with acute deep vein thrombosis involving the left peroneal veins, left common femoral vein, left proximal profunda vein, and left popliteal vein. No cystic structure found in the popliteal fossa. Preliminary    Transthoracic Echocardiogram   1. Left ventricular ejection fraction, by visual estimation, is 60 to 65%. The left ventricle has normal function. Left ventricular septal wall thickness was mildly increased. Mildly increased left ventricular posterior wall thickness. There is mildly  increased left ventricular hypertrophy.  2. Global right ventricle has normal systolic function.The right ventricular size is normal. No increase in right ventricular wall thickness.  3. Left atrial size was normal.  4. Right atrial size was normal.  5. The mitral valve is normal in structure. Trace mitral valve regurgitation. No evidence of mitral stenosis.  6. The tricuspid valve is normal in structure. Tricuspid valve regurgitation is trivial.  7. The aortic valve is normal in structure. Aortic valve regurgitation is not visualized. No evidence of aortic valve sclerosis or stenosis.  8. The pulmonic valve was normal in structure. Pulmonic valve regurgitation is not visualized.  9. Normal pulmonary artery systolic pressure. 10. The inferior vena cava is normal in size with greater than 50% respiratory variability, suggesting right atrial pressure of 3 mmHg.  ECG - ST rate 122 BPM. (See cardiology reading for complete details)  EEG - This EEG recorded evidence of a generalized nonspecific cerebral dysfunction (encephalopathy) in addition to focal dysfunction.  Though the prominence of slow activity over the right hemisphere would be suggestive of a focal right sided dysfunction, my suspicion is that this represents a generalized dysfunction with attenuation of the  frequencies on the left due to his known stroke.  There is also evidence of left-sided dysfunction in the attenuation of the posterior dominant rhythm on that side. There was no seizure or seizure predisposition recorded on this study.    PHYSICAL EXAM  Blood pressure (!) 152/87, pulse 95, temperature 97.9 F (36.6 C), temperature source Axillary, resp. rate 17, height 6' (1.829 m), weight 103.7 kg, SpO2 100 %.  General - Well nourished, well developed, intubated off sedation.  Ophthalmologic - fundi not visualized due to noncooperation.  Cardiovascular - Regular rate and rhythm.  Neuro - intubated off sedation, eyes closed not open on voice, just received 4mg  of versed, not following commands. With eye opening, eyes in left gaze preference position, not blinking to  visual threat bilaterally, doll's eyes present, PERRL. Corneal reflex positive on the left, very weak on the right, gag and cough present. Breathing over the vent.  Facial symmetry not able to test due to ET tube.  Tongue protrusion not cooperative. On pain stimulation, no movement of RUE and slight withdraw on the BLE and LUE. DTR 1+ and bilateral positive babinski. Sensation, coordination and gait not tested.  ASSESSMENT/PLAN Mr. ASAPH GLECKLER is a 58 y.o. 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  MRI head -  Large amount of abnormal diffusion restriction within the left hemisphere, predominantly cortical/subcortical.  MRA H&N - Motion degraded MRA of the head and neck. No emergent large vessel occlusion or high-grade stenosis.    CTA H&N High grade R ICA origin stenosis. Small L ICA.  LE Venous Dopplers - LLE acute DVT  TCD with bubble study - no PFO  2D Echo EF 60-65%  Hold off other embolic stroke work up for now given on anticoagulation for DVT  [employer] Virus 2 - negative  LDL - 69  HgbA1c - 8.1  UDS - negative  VTE prophylaxis - Heparin IV   aspirin 81 mg daily prior to admission, now on aspirin 325 and heparin IV per stroke protocol   Therapy recommendations:  pending  Disposition:  Pending  Seizure   11/28 With GTC and left gaze, lasting 30 sec  12/1 with eye flattering and upper left gaze, resolved after 4mg  versed  EEG no seizure, generalized and localized encephalopathy  Continue keppra 1000mg  bid  Ativan PRN  Will repeat EEG  LE DVT  LE Venous Dopplers - LLE acute DVT  Unclear etiology   On heparin drip per stroke protocol    CBC monitoring  Acute Hypoxemic Respiratory failure   Intubated off sedation  CCM on board  Not able to extubate  Encephalopathy due to metabolic acidosis, AKI, hypernatremia  Creatinine 3.81-1.55-1.31-1.05-1.10   Sodium [id number]   Off 1/2NS and free water  Nephrology on board  Hypertension  Home BP meds: Zestril ; Toprol-XL  Current BP meds: none   Stable . Permissive hypertension (OK if < 180/105) but gradually normalize in 5-7 days  . Long-term BP goal normotensive  Hyperlipidemia  Home Lipid lowering medication: Lipitor 40 mg daily  LDL - 69, at goal < 70  Current lipid lowering medication: Lipitor 40 mg daily   Continue statin at discharge  Diabetes type II, uncontrolled  Home diabetic meds: Insulin ; metformin  Current diabetic meds: levemir  HgbA1c  8.1, goal < 7.0  SSI  CBG monitoring  DB RN following. Recommends increase in levimir and not continuing metformin at d/c  Other Stroke Risk Factors  Cigarette smoker - will advise to stop smoking  ETOH use, advised to drink no more than 1 alcoholic beverage per day.  Hx substance abuse  Obesity, Body mass index is 31.01 kg/m.,  recommend weight loss, diet and exercise as appropriate   Coronary artery disease  Substance Abuse hx - cocaine ; benzodiazepines    Medical non compliance  Other Active Problems  Acute hypoxemic respiratory failure on vent.  Acute toxic metabolic encephalopathy  ARF  Schizoaffective d/o  MDD  Hx suicide ateempts  Normocytic anemia  Thrombocytopenia   Hypokalemia -supplement  LUE blister  Hospital day # 6  This patient is critically ill due to left brain stroke, seizure, AKI, encephalopathy, respiratory failure and at significant risk of neurological worsening, death form recurrent stroke, cerebral edema, brain herniation, status epilepticus. This patient's care requires constant monitoring of vital signs, hemodynamics, respiratory and cardiac monitoring, review of multiple databases, neurological assessment, discussion with family, other specialists and medical decision making of high complexity. I spent 35 minutes of neurocritical care time in the care of this patient.  Rosalin Hawking, MD PhD Stroke Neurology 09/18/2019 11:10 AM  To contact Stroke Continuity provider, please refer to [email or web address] After hours, contact General Neurology

## 2019-09-19 DIAGNOSIS — Z978 Presence of other specified devices: Secondary | ICD-10-CM | POA: Diagnosis not present

## 2019-09-19 DIAGNOSIS — J9601 Acute respiratory failure with hypoxia: Secondary | ICD-10-CM | POA: Diagnosis not present

## 2019-09-19 DIAGNOSIS — G92 Toxic encephalopathy: Secondary | ICD-10-CM | POA: Diagnosis not present

## 2019-09-19 DIAGNOSIS — I63132 Cerebral infarction due to embolism of left carotid artery: Secondary | ICD-10-CM

## 2019-09-19 LAB — RENAL FUNCTION PANEL
Albumin: 2.2 g/dL — ABNORMAL LOW (ref 3.5–5.0)
Anion gap: 10 (ref 5–15)
BUN: 38 mg/dL — ABNORMAL HIGH (ref 6–20)
CO2: 24 mmol/L (ref 22–32)
Calcium: 8.7 mg/dL — ABNORMAL LOW (ref 8.9–10.3)
Chloride: 108 mmol/L (ref 98–111)
Creatinine, Ser: 1.18 mg/dL (ref 0.61–1.24)
GFR calc Af Amer: >60 mL/min (ref >60)
GFR calc non Af Amer: >60 mL/min (ref >60)
Glucose, Bld: 285 mg/dL — ABNORMAL HIGH (ref 70–99)
Phosphorus: 4 mg/dL (ref 2.5–4.6)
Potassium: 5.2 mmol/L — ABNORMAL HIGH (ref 3.5–5.1)
Sodium: 142 mmol/L (ref 135–145)

## 2019-09-19 LAB — BASIC METABOLIC PANEL
Anion gap: 10 (ref 5–15)
BUN: 39 mg/dL — ABNORMAL HIGH (ref 6–20)
CO2: 24 mmol/L (ref 22–32)
Calcium: 8.8 mg/dL — ABNORMAL LOW (ref 8.9–10.3)
Chloride: 111 mmol/L (ref 98–111)
Creatinine, Ser: 1.25 mg/dL — ABNORMAL HIGH (ref 0.61–1.24)
GFR calc Af Amer: >60 mL/min (ref >60)
GFR calc non Af Amer: >60 mL/min (ref >60)
Glucose, Bld: 291 mg/dL — ABNORMAL HIGH (ref 70–99)
Potassium: 5.1 mmol/L (ref 3.5–5.1)
Sodium: 145 mmol/L (ref 135–145)

## 2019-09-19 LAB — CBC
HCT: 33.8 % — ABNORMAL LOW (ref 39.0–52.0)
Hemoglobin: 10.7 g/dL — ABNORMAL LOW (ref 13.0–17.0)
MCH: 30.6 pg (ref 26.0–34.0)
MCHC: 31.7 g/dL (ref 30.0–36.0)
MCV: 96.6 fL (ref 80.0–100.0)
Platelets: 137 10*3/uL — ABNORMAL LOW (ref 150–400)
RBC: 3.5 MIL/uL — ABNORMAL LOW (ref 4.22–5.81)
RDW: 13.7 % (ref 11.5–15.5)
WBC: 7.6 10*3/uL (ref 4.0–10.5)
nRBC: 0 % (ref 0.0–0.2)

## 2019-09-19 LAB — GLUCOSE, CAPILLARY
Glucose-Capillary: 235 mg/dL — ABNORMAL HIGH (ref 70–99)
Glucose-Capillary: 237 mg/dL — ABNORMAL HIGH (ref 70–99)
Glucose-Capillary: 239 mg/dL — ABNORMAL HIGH (ref 70–99)
Glucose-Capillary: 245 mg/dL — ABNORMAL HIGH (ref 70–99)
Glucose-Capillary: 261 mg/dL — ABNORMAL HIGH (ref 70–99)
Glucose-Capillary: 275 mg/dL — ABNORMAL HIGH (ref 70–99)

## 2019-09-19 LAB — HEPARIN LEVEL (UNFRACTIONATED)
Heparin Unfractionated: 0.21 IU/mL — ABNORMAL LOW (ref 0.30–0.70)
Heparin Unfractionated: 0.23 IU/mL — ABNORMAL LOW (ref 0.30–0.70)
Heparin Unfractionated: 0.28 IU/mL — ABNORMAL LOW (ref 0.30–0.70)

## 2019-09-19 MED ORDER — METOPROLOL TARTRATE 25 MG/10 ML ORAL SUSPENSION
12.5000 mg | Freq: Two times a day (BID) | ORAL | Status: DC
  Administered 2019-09-19 – 2019-09-20 (×3): 12.5 mg via NASOGASTRIC
  Filled 2019-09-19 (×3): qty 10

## 2019-09-19 MED ORDER — INSULIN DETEMIR 100 UNIT/ML ~~LOC~~ SOLN
25.0000 [IU] | Freq: Two times a day (BID) | SUBCUTANEOUS | Status: DC
  Administered 2019-09-19 – 2019-09-20 (×3): 25 [IU] via SUBCUTANEOUS
  Filled 2019-09-19 (×4): qty 0.25

## 2019-09-19 MED ORDER — LEVETIRACETAM IN NACL 500 MG/100ML IV SOLN
500.0000 mg | Freq: Once | INTRAVENOUS | Status: AC
  Administered 2019-09-19: 500 mg via INTRAVENOUS
  Filled 2019-09-19: qty 100

## 2019-09-19 MED ORDER — INSULIN ASPART 100 UNIT/ML ~~LOC~~ SOLN
7.0000 [IU] | SUBCUTANEOUS | Status: DC
  Administered 2019-09-19 – 2019-09-27 (×38): 7 [IU] via SUBCUTANEOUS

## 2019-09-19 MED ORDER — LEVETIRACETAM IN NACL 1500 MG/100ML IV SOLN
1500.0000 mg | Freq: Two times a day (BID) | INTRAVENOUS | Status: DC
  Administered 2019-09-19 – 2019-09-28 (×19): 1500 mg via INTRAVENOUS
  Filled 2019-09-19 (×20): qty 100

## 2019-09-19 MED ORDER — IPRATROPIUM-ALBUTEROL 0.5-2.5 (3) MG/3ML IN SOLN: 3.0000 mL | Freq: Four times a day (QID) | RESPIRATORY_TRACT | Status: DC | PRN

## 2019-09-19 NOTE — Progress Notes (Signed)
[place] for heparin Indication: DVT and stroke  Allergies  Allergen Reactions  . Quetiapine Anaphylaxis  . Seroquel [Quetiapine Fumarate] Anxiety and Other (See Comments)    Reaction:  Nightmares   . Trazodone Anaphylaxis  . Trazodone And Nefazodone Anxiety and Other (See Comments)    Other reaction(s): Unknown Nightmares Reaction:  Nightmares   . Vistaril [Hydroxyzine Hcl] Nausea And Vomiting  . Haldol [Haloperidol Lactate] Anxiety and Other (See Comments)    Reaction:  Nightmares     Patient Measurements: Height: 6' (182.9 cm) Weight: 233 lb 14.5 oz (106.1 kg) IBW/kg (Calculated) : 77.6 Heparin Dosing Weight: 97.7 kg  Vital Signs: Temp: 99.3 F (37.4 C) (12/02 1929) Temp Source: Oral (12/02 1929) BP: 110/50 (12/02 1800) Pulse Rate: 95 (12/02 1800)  Labs: Recent Labs    09/17/19 0312  09/18/19 0345  09/19/19 0427 09/19/19 0428 09/19/19 0443 09/19/19 0950 09/19/19 1245 09/19/19 2030  HGB 10.3*  --  11.2*  --   --  10.7*  --   --   --   --   HCT 31.5*  --  33.5*  --   --  33.8*  --   --   --   --   PLT 104*  --  125*  --   --  137*  --   --   --   --   HEPARINUNFRC  --    < >  --    < >  --   --  0.21*  --  0.23* 0.28*  CREATININE 1.05  --  1.10  --  1.18  --   --  1.25*  --   --    < > = values in this interval not displayed.    Estimated Creatinine Clearance: 80.1 mL/min (A) (by C-G formula based on SCr of 1.25 mg/dL (H)).   Assessment: 58 yr old male presented with acute onset of encephalopathy - found to have seizure-like activity, with CT head showing infarcts in L hemisphere. Also, duplex finding acute DVT in L peroneal, common femoral, proximal profunda, and popliteal veins. No anticoagulation PTA.  Pharmacy consulted to dose heparin.  Hep lvl slightly low 0.28 Currently having some hematuria, elink doc aware. Rn to monitor closely but continue for now   Goal of Therapy:  Heparin level 0.3-0.5 units/ml Monitor  platelets by anticoagulation protocol: Yes   Plan:  Increase heparin to 1050 units/hr Next lvl with am labs  Barth Kirks, PharmD, BCPS, BCCCP Clinical Pharmacist [phone]  Please check AMION for all [place] numbers  09/19/2019 9:21 PM

## 2019-09-19 NOTE — Progress Notes (Signed)
eLink Physician-Brief Progress Note Patient Name: [name] DOB: 1961-07-05 MRN: [id number]   Date of Service  09/19/2019  HPI/Events of Note  Hematuria - Patient is currently on a Heparin IV infusion for LLE DVT. Hgb = 10.7. Difficult situation. He needs therapeutic anticoagulation for his DVT.  eICU Interventions  Will continue Heparin IV infusion for the present. If hematuria gets worse may be forced to hold or D/C Heparin/place IVC filter. Bedside nurse to monitor hematuria closely.      Intervention Category Major Interventions: Other:  Lysle Dingwall 09/19/2019, 8:43 PM

## 2019-09-19 NOTE — Progress Notes (Signed)
RT note: patient placed on CPAP/PSV of 5/5 at 0730.  Currently tolerating well.  Will continue to monitor.  

## 2019-09-19 NOTE — Progress Notes (Signed)
NAME:  [name], MRN:  [id number], DOB:  1961-09-15, LOS: 7 ADMISSION DATE:  09/12/2019, CONSULTATION DATE: 11/25 REFERRING MD:  Audria Nine, DO CHIEF COMPLAINT: Toxic ingestion, altered mental status  Brief History   [name] is a 58 year old gentleman with a history of alcohol abuse disorder, schizophrenia, diabetes who presented from jail for drunk and disorderly conduct and presented to [facility] emergency department on 11/25.  In jail he was found to be obtunded and tachypneic and was therefore brought to the ED. given his history of ethylene glycol toxicity, multiple suicide attempts, and labs consistent with metabolic acidosis and acute kidney injury, he was treated empirically as ethylene glycol toxicity.  He was intubated in the [facility] on admission and is now 85M ICU.  Over the course of his hospital stay he was passing his SBT's, but was unable to be extubated for mental status.  His urine and serum testing for ethylene glycol was a send out lab at [facility], and was negative.  His urine was positive only for ketones.  Fomepizole was stopped after 48 hours, and once his acid-base disorder had resolved. Imaging + Acute L MCA/ACA stroke. Patient has remained persistently encephalopathic requiring mechanical ventilation.   Consults:  Nephrology, off neurology  Micro Data:  11/25, Covid negative 11/25 BCx> no growth  11/26 sputum culture rare gram-positive cocci 11/26 MRSA PCR negative 11/26 tracheal aspirate> normal flora   Significant Diagnostic Tests:  11/25 CT H> no acute intracranial abnormality 11/28 CT H> L frontal hypodensity 11/29 MRI/A h/n>L hemispheric abnormal diffusion restriction. MRA without large vessel occlusion or stenosis however exam limited by motion degradation  11/29 ECHO> LVEF 60-65%, no septal defect 11/30 CT angio head/neck> Progressive patchy hypodensity L frontal lobe, high-grade R ICA stenosis, Stenosis vs subtotal occlusion of non-dominant  proximal R vertebral artery, no large vessel occlusion  12/1 CXR> Low lung volumes, no focal ASD   Antimicrobials:  S/p metronidazole x1 Received 48 hours empiric vancomycin and cefepime  Interim history/subjective:  Breakthrough seizures requiring ativan and increased Keppra Remains mechanically ventilated and encephalopathic  Objective   Blood pressure (!) 142/77, pulse 95, temperature 99.1 F (37.3 C), temperature source Axillary, resp. rate 17, height 6' (1.829 m), weight 106.1 kg, SpO2 100 %.    Vent Mode: PRVC FiO2 (%):  [40 %] 40 % Set Rate:  [16 bmp] 16 bmp Vt Set:  [600 mL] 600 mL PEEP:  [5 cmH20] 5 cmH20 Pressure Support:  [5 cmH20] 5 cmH20 Plateau Pressure:  [14 cmH20-18 cmH20] 15 cmH20   Intake/Output Summary (Last 24 hours) at 09/19/2019 0721 Last data filed at 09/19/2019 0536 Gross per 24 hour  Intake 905.68 ml  Output 3275 ml  Net -2369.32 ml   Filed Weights   09/17/19 0424 09/18/19 0237 09/19/19 0412  Weight: 102.3 kg 103.7 kg 106.1 kg    Examination: General:  Encephalopathic, adult male intubated HENT:  AT/[place] Lungs: Scattered rhonchi, symmetric expansion, thin clear secretions via ETT Cardiac: SR on telemetry, cool lower extremity, BUE Edema R>L Abdomen: obese, soft, non tender, nondistended Extremities: No deformities Neuro: Eyes open spontaneously, Right pupil 3 and left pupil 2, both reactive. + cough/gag, w/d to BLE.  RUE flaccid.   Assessment & Plan:   Acute ischemic stroke -L frontal infarct; multiple punctate areas of restriction diffusion vs embolic infarcts -High-grade R ICA stenosis, severe stenosis vs subtotal occlusion of non-dominant proximal R vertebral artery  -Secondary stroke work up: LDL 69, hgbA1c: 81, TTE:  No PFO.  + Lower extremity DVT  P: -Neurology following -ASA/Statin per Neuro.  Heparin drip for LE DVT  New onset tonic-clonic seizure -EEG without seizure; but with general and focal dysfunction-- L sided dysfunction and  attenuation, R sided dysfunction, likely in setting of known stroke P -Neurology following -Keppra increased to 1500 mg BID overnight  Acute hypoxemic respiratory failure, improving P: Neurological exam and inability to protect airway prohibits extubation.  On PRVC this am, Continue daily SBT.  Toxic metabolic encephalopathy -ethylene glycol neg at [facility] -acetone level mildly elevated  P - Thiamine - Delirium prevention  Hypertension -Home meds: Imdur 30 mg daily, lisinopril 10 mg daily, metoprolol 25 mg BID P: -Resume low dose metoprolol today  AKI, improved P -Continue supportive care and monitor I/O  DM2 -persistent hyperglycemia P -Resistant SSI + Scheduled insulin 7 units q 4 hours -Increase Detemir to 25 units BID  Hx EtOH abuse P -continue supplements  LLE DVT P -IV heparin per pharmacy   Thrombocytopenia, mild -stable  P: -No acute intereventions  Schizoaffective disorder -Home meds: Hydroxyzine and Risperdone (newly prescribed) MDD -h/o multiple suicide attempts P  -holding home meds in setting of encephalopathy.  Will need psych eval once neurological exam improved  LUE blister P -qD and PRN clean with warm soapy water, cover with thin layer of bacitracin and xeroform   Best practice:  Diet: EN Pain/Anxiety/Delirium protocol (if indicated): PRN fentanyl VAP protocol (if indicated): Yes DVT prophylaxis: heparin gtt GI prophylaxis: PPI Glucose control: SSI resistant, BID Levimir, EN coverage  Mobility: bed rest Code Status: full code Family Communication:  Will attempt to contact: Berenice Primas: Niece [phone] Disposition: ICU   Labs   CBC: Recent Labs  Lab 09/12/19 1316  09/14/19 0718 09/15/19 0913 09/16/19 0306 09/17/19 0312 09/18/19 0345 09/19/19 0428  WBC 14.8*   < > 8.2 8.5 6.9 5.6 7.5 7.6  NEUTROABS 12.2*  --  6.2  --   --   --   --   --   HGB 16.9   < > 15.0 13.5 12.7* 10.3* 11.2* 10.7*  HCT 53.5*   < > 44.7 39.5 37.6*  31.5* 33.5* 33.8*  MCV 95.5   < > 92.5 90.6 91.7 93.5 93.1 96.6  PLT 224   < > PLATELET CLUMPS NOTED ON SMEAR, COUNT APPEARS DECREASED PLATELET CLUMPS NOTED ON SMEAR, COUNT APPEARS DECREASED PLATELET CLUMPS NOTED ON SMEAR, COUNT APPEARS DECREASED 104* 125* 137*   < > = values in this interval not displayed.    Basic Metabolic Panel: Recent Labs  Lab 09/14/19 2105 09/15/19 0536 09/15/19 1713 09/16/19 0306 09/17/19 0312 09/17/19 1023 09/18/19 0345 09/19/19 0427  NA  --  146*  --  146* 133*  --  141 142  K  --  3.4*  --  3.6 3.4*  --  4.9 5.2*  CL  --  109  --  106 103  --  109 108  CO2  --  21*  --  25 23  --  24 24  GLUCOSE  --  332*  --  205* 234*  --  284* 285*  BUN  --  62*  --  55* 41*  --  40* 38*  CREATININE  --  1.55*  --  1.31* 1.05  --  1.10 1.18  CALCIUM  --  8.6*  --  8.7* 7.2*  --  8.6* 8.7*  MG 2.3 2.2 2.2  --   --  2.0 2.0  --  PHOS 2.6 1.9* 3.3 2.8 2.3*  --  3.2 4.0   GFR: Estimated Creatinine Clearance: 84.9 mL/min (by C-G formula based on SCr of 1.18 mg/dL). Recent Labs  Lab 09/12/19 1316 09/12/19 1516 09/13/19 0545  09/16/19 0306 09/17/19 0312 09/18/19 0345 09/19/19 0428  PROCALCITON  --   --  0.33  --   --   --   --   --   WBC 14.8*  --  9.0   < > 6.9 5.6 7.5 7.6  LATICACIDVEN 2.7* 0.9  --   --   --   --   --   --    < > = values in this interval not displayed.    Liver Function Tests: Recent Labs  Lab 09/12/19 1316  09/13/19 0545  09/14/19 0718 09/16/19 0306 09/17/19 0312 09/18/19 0345 09/19/19 0427  AST 104*  --  123*  --  101*  --   --  28  --   ALT 65*  --  66*  --  57*  --   --  38  --   ALKPHOS 78  --  67  --  49  --   --  55  --   BILITOT 1.5*  --  1.1  --  1.3*  --   --  0.4  --   PROT 8.5*  --  6.9  --  5.7*  --   --  5.1*  --   ALBUMIN 4.6   < > 3.5   < > 2.7* 2.5* 1.9* 2.3* 2.2*   < > = values in this interval not displayed.   No results for input(s): LIPASE, AMYLASE in the last 168 hours. Recent Labs  Lab 09/12/19 1325   AMMONIA 29    ABG    Component Value Date/Time   PHART 7.377 09/13/2019 0559   PCO2ART 32.5 09/13/2019 0559   PO2ART 118 (H) 09/13/2019 0559   HCO3 18.4 (L) 09/13/2019 0559   TCO2 19 (L) 05/30/2019 0535   ACIDBASEDEF 5.6 (H) 09/13/2019 0559   O2SAT 98.0 09/13/2019 0559     Coagulation Profile: Recent Labs  Lab 09/12/19 1316  INR 1.1    Cardiac Enzymes: No results for input(s): CKTOTAL, CKMB, CKMBINDEX, TROPONINI in the last 168 hours.  HbA1C: HB A1C (BAYER DCA - WAIVED)  Date/Time Value Ref Range Status  11/11/2016 11:32 AM 7.6 (H) <7.0 % Final    Comment:                                          Diabetic Adult            <7.0                                       Healthy Adult        4.3 - 5.7                                                           (DCCT/NGSP) American Diabetes Association's Summary of Glycemic Recommendations for Adults with Diabetes: Hemoglobin A1c <7.0%. More stringent glycemic goals (A1c <  6.0%) may further reduce complications at the cost of increased risk of hypoglycemia.    Hgb A1c MFr Bld  Date/Time Value Ref Range Status  09/13/2019 05:45 AM 8.1 (H) 4.8 - 5.6 % Final    Comment:    (NOTE) Pre diabetes:          5.7%-6.4% Diabetes:              >6.4% Glycemic control for   <7.0% adults with diabetes   05/03/2019 09:40 AM 8.8 (H) 4.8 - 5.6 % Final    Comment:    (NOTE) Pre diabetes:          5.7%-6.4% Diabetes:              >6.4% Glycemic control for   <7.0% adults with diabetes     CBG: Recent Labs  Lab 09/18/19 1056 09/18/19 1505 09/18/19 1913 09/18/19 2300 09/19/19 0415  GLUCAP 284* 303* 224* 275* 261*    Total critical care time:   Critical care time was exclusive of separately billable procedures and treating other patients. Critical care was necessary to treat or prevent imminent or life-threatening deterioration.  Critical care was time spent personally by me on the following activities: development of  treatment plan with patient and/or surrogate as well as nursing, discussions with consultants, evaluation of patient's response to treatment, examination of patient, obtaining history from patient or surrogate, ordering and performing treatments and interventions, ordering and review of laboratory studies, ordering and review of radiographic studies, pulse oximetry and re-evaluation of patient's condition.  Paulita Fujita, ACNP [facility]  Pager: [phone] After hours pager: [phone]

## 2019-09-19 NOTE — Progress Notes (Signed)
[facility] for heparin Indication: DVT and stroke  Allergies  Allergen Reactions  . Quetiapine Anaphylaxis  . Seroquel [Quetiapine Fumarate] Anxiety and Other (See Comments)    Reaction:  Nightmares   . Trazodone Anaphylaxis  . Trazodone And Nefazodone Anxiety and Other (See Comments)    Other reaction(s): Unknown Nightmares Reaction:  Nightmares   . Vistaril [Hydroxyzine Hcl] Nausea And Vomiting  . Haldol [Haloperidol Lactate] Anxiety and Other (See Comments)    Reaction:  Nightmares     Patient Measurements: Height: 6' (182.9 cm) Weight: 233 lb 14.5 oz (106.1 kg) IBW/kg (Calculated) : 77.6 Heparin Dosing Weight: 97.7 kg  Vital Signs: Temp: 99.8 F (37.7 C) (12/02 1148) Temp Source: Oral (12/02 1148) BP: 103/79 (12/02 1207) Pulse Rate: 95 (12/02 1207)  Labs: Recent Labs    09/17/19 0312  09/18/19 0345  09/18/19 2230 09/19/19 0427 09/19/19 0428 09/19/19 0443 09/19/19 0950 09/19/19 1245  HGB 10.3*  --  11.2*  --   --   --  10.7*  --   --   --   HCT 31.5*  --  33.5*  --   --   --  33.8*  --   --   --   PLT 104*  --  125*  --   --   --  137*  --   --   --   HEPARINUNFRC  --    < >  --    < > 0.30  --   --  0.21*  --  0.23*  CREATININE 1.05  --  1.10  --   --  1.18  --   --  1.25*  --    < > = values in this interval not displayed.    Estimated Creatinine Clearance: 80.1 mL/min (A) (by C-G formula based on SCr of 1.25 mg/dL (H)).   Assessment: 58 yr old male presented with acute onset of encephalopathy - found to have seizure-like activity, with CT head showing infarcts in L hemisphere. Also, duplex finding acute DVT in L peroneal, common femoral, proximal profunda, and popliteal veins. No anticoagulation PTA.  Pharmacy consulted to dose heparin.  Heparin level drawn increasing heparin infusion to 900 units/hr earlier today is 0.23 units/ml, which is below the goal range for this patient (targeting lower goal per protocol with  acute CVA). Hgb low but stable, plt improved to 137. No bleeding or issues with infusion, per discussion with RN.   Goal of Therapy:  Heparin level 0.3-0.5 units/ml Monitor platelets by anticoagulation protocol: Yes   Plan:  Increase heparin infusion to 950 units/hr Check 6-8 hr heparin level Monitor daily heparin level and CBC Monitor for signs/symptoms of bleeding  Alanda Slim, PharmD, [facility] Clinical Pharmacist Please see AMION for all Pharmacists' Contact Phone Numbers 09/19/2019, 1:47 PM

## 2019-09-19 NOTE — Procedures (Signed)
Patient Name: [name]  MRN: [id number]  Epilepsy Attending: Lora Havens  Referring Physician/Provider: Dr. Rosalin Hawking Date: 09/18/2019 Duration: 22.47 minutes  Patient history: 58 year old male who presented with right-sided weakness, left gaze deviation and seizure.  EEG evaluate for seizures.  Level of alertness: Awake  AEDs during EEG study: Keppra  Technical aspects: This EEG study was done with scalp electrodes positioned according to the 10-20 International system of electrode placement. Electrical activity was acquired at a sampling rate of 500Hz  and reviewed with a high frequency filter of 70Hz  and a low frequency filter of 1Hz . EEG data were recorded continuously and digitally stored.   Description: During awake state, no clear posterior dominant rhythm was seen.  EEG showed continuous generalized and lateralized left hemisphere 3 to 5 Hz theta-delta slowing.  Hyperventilation photic stimulation not performed.  Abnormality -Continuous slow, generalized and lateralized left hemisphere  IMPRESSION: This study is suggestive of cortical dysfunction in left hemisphere likely secondary to underlying abnormality.  In addition there is evidence of moderate diffuse encephalopathy, nonspecific etiology. No seizures or epileptiform discharges were seen throughout the recording.

## 2019-09-19 NOTE — Progress Notes (Signed)
STROKE TEAM PROGRESS NOTE   INTERVAL HISTORY No family at bedside. As per chart, pt had another breakthrough seizure with gaze deviation and facial twitching, received ativan. keppra increased to 1500 bid. This am during my visit, pt still intubated, tracking on the left but right gaze palsy, RUE flaccid, LUE able to against gravity at bicep. Minimal movement of BLEs. EEG showed encephalopathy and no seizure.   OBJECTIVE Vitals:   09/19/19 0600 09/19/19 0730 09/19/19 0800 09/19/19 0904  BP: (!) 142/77 133/79 115/80 126/84  Pulse:  (!) 104 (!) 103 (!) 104  Resp: 17 16 (!) 23   Temp:   99.8 F (37.7 C)   TempSrc:   Axillary   SpO2:  100% 100%   Weight:      Height:        CBC:  Recent Labs  Lab 09/12/19 1316  09/14/19 0718  09/18/19 0345 09/19/19 0428  WBC 14.8*   < > 8.2   < > 7.5 7.6  NEUTROABS 12.2*  --  6.2  --   --   --   HGB 16.9   < > 15.0   < > 11.2* 10.7*  HCT 53.5*   < > 44.7   < > 33.5* 33.8*  MCV 95.5   < > 92.5   < > 93.1 96.6  PLT 224   < > PLATELET CLUMPS NOTED ON SMEAR, COUNT APPEARS DECREASED   < > 125* 137*   < > = values in this interval not displayed.    Basic Metabolic Panel:  Recent Labs  Lab 09/17/19 1023 09/18/19 0345 09/19/19 0427  NA  --  141 142  K  --  4.9 5.2*  CL  --  109 108  CO2  --  24 24  GLUCOSE  --  284* 285*  BUN  --  40* 38*  CREATININE  --  1.10 1.18  CALCIUM  --  8.6* 8.7*  MG 2.0 2.0  --   PHOS  --  3.2 4.0    Lipid Panel:     Component Value Date/Time   CHOL 122 09/17/2019 0312   CHOL 184 11/11/2016 1007   TRIG 158 (H) 09/17/2019 0312   HDL 21 (L) 09/17/2019 0312   HDL 25 (L) 11/11/2016 1007   CHOLHDL 5.8 09/17/2019 0312   VLDL 32 09/17/2019 0312   LDLCALC 69 09/17/2019 0312   LDLCALC 118 (H) 11/11/2016 1007   HgbA1c:  Lab Results  Component Value Date   HGBA1C 8.1 (H) 09/13/2019   Urine Drug Screen:     Component Value Date/Time   LABOPIA NONE DETECTED 09/12/2019 1326   COCAINSCRNUR NONE DETECTED  09/12/2019 1326   COCAINSCRNUR NONE DETECTED 12/09/2015 2242   LABBENZ NONE DETECTED 09/12/2019 1326   AMPHETMU NONE DETECTED 09/12/2019 1326   THCU NONE DETECTED 09/12/2019 1326   LABBARB NONE DETECTED 09/12/2019 1326    Alcohol Level     Component Value Date/Time   ETH <10 09/12/2019 1326    IMAGING  Ct Angio Head W Or Wo Contrast Ct Angio Neck W Or Wo Contrast 09/17/2019 1. Negative for large vessel occlusion. Diminutive appearance of the intracranial circulation but no circle-of-Willis branch occlusion or significant intracranial stenosis identified. 2. Positive for severe stenosis vs. subtotal occlusion of the non-dominant proximal Right Vertebral Artery. This right vertebral terminates in PICA. 3. Positive also for high-grade Right ICA origin stenosis due to bulky calcified plaque, although the degree of this stenosis is numerically underestimated  due to diminutive downstream right ICA (3 mm diameter). 4. The left ICA is similarly diminutive, but with no superimposed stenosis despite atherosclerosis. 5. Progressive patchy hypodensity in the left frontal lobe since 09/15/2019 suggests ischemia rather than postictal changes on the MRI yesterday. No associated hemorrhage or mass effect. 6. No new intracranial abnormality.   Dg Chest Port 1 View 09/18/2019 1. No acute cardiopulmonary disease or significant interval change. 2. Stable support apparatus. 3. Low lung volumes.    Ct Head Wo Contrast 09/16/2019 IMPRESSION:  1. No acute intracranial hemorrhage.  2. Ill-defined areas of hypodensity in the left frontal convexity and left basal ganglia, new since the prior CT and concerning for acute infarct. Further evaluation with MRI is recommended.   Mr Brain 35 Contrast Mr Angio Head Wo Contrast Mr Angio Neck Wo Contrast 09/16/2019 IMPRESSION:  1. Large amount of abnormal diffusion restriction, likely postictal, within the left hemisphere, predominantly cortical/subcortical.  2.  Motion degraded MRA of the head and neck. No emergent large vessel occlusion or high-grade stenosis.   Vas [place] Lower Extremity Venous (dvt) 09/16/2019 Summary:  Right: There is no evidence of deep vein thrombosis in the lower extremity. No cystic structure found in the popliteal fossa.  Left: Findings consistent with acute deep vein thrombosis involving the left peroneal veins, left common femoral vein, left proximal profunda vein, and left popliteal vein. No cystic structure found in the popliteal fossa. Preliminary    Transthoracic Echocardiogram   1. Left ventricular ejection fraction, by visual estimation, is 60 to 65%. The left ventricle has normal function. Left ventricular septal wall thickness was mildly increased. Mildly increased left ventricular posterior wall thickness. There is mildly  increased left ventricular hypertrophy.  2. Global right ventricle has normal systolic function.The right ventricular size is normal. No increase in right ventricular wall thickness.  3. Left atrial size was normal.  4. Right atrial size was normal.  5. The mitral valve is normal in structure. Trace mitral valve regurgitation. No evidence of mitral stenosis.  6. The tricuspid valve is normal in structure. Tricuspid valve regurgitation is trivial.  7. The aortic valve is normal in structure. Aortic valve regurgitation is not visualized. No evidence of aortic valve sclerosis or stenosis.  8. The pulmonic valve was normal in structure. Pulmonic valve regurgitation is not visualized.  9. Normal pulmonary artery systolic pressure. 10. The inferior vena cava is normal in size with greater than 50% respiratory variability, suggesting right atrial pressure of 3 mmHg.  ECG - ST rate 122 BPM. (See cardiology reading for complete details)  EEG - This EEG recorded evidence of a generalized nonspecific cerebral dysfunction (encephalopathy) in addition to focal dysfunction.  Though the prominence of slow activity  over the right hemisphere would be suggestive of a focal right sided dysfunction, my suspicion is that this represents a generalized dysfunction with attenuation of the frequencies on the left due to his known stroke.  There is also evidence of left-sided dysfunction in the attenuation of the posterior dominant rhythm on that side. There was no seizure or seizure predisposition recorded on this study.    PHYSICAL EXAM  Blood pressure 126/84, pulse (!) 104, temperature 99.8 F (37.7 C), temperature source Axillary, resp. rate (!) 23, height 6' (1.829 m), weight 106.1 kg, SpO2 100 %.  General - Well nourished, well developed, intubated off sedation.  Ophthalmologic - fundi not visualized due to noncooperation.  Cardiovascular - Regular rate and rhythm.  Neuro - intubatedoffsedation, eyes closed but  easily open on voice,notfollowing commands. With eye opening, eyes in left gaze preferenceposition, not cross midlin,blinking to visual threat on the left but not on the right, doll's eyespresent, trackingon the left, PERRL. Corneal reflexpositive on the left, absent on the right, gag and coughpresent. Breathing over the vent. Facial symmetry not able to test due to ET tube. Tongue protrusion not cooperative. On pain stimulation, no movement of RUE and slight withdraw on the BLE. LUE able to hold against gravity briefly on left bicep.DTR 1+ and bilateral positivebabinski. Sensation, coordination and gait not tested.  ASSESSMENT/PLAN [name] is a 58 y.o. 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  MRI head -  Large amount of abnormal diffusion restriction within the left hemisphere, predominantly cortical/subcortical.  MRA H&N - Motion degraded MRA of the head and neck. No emergent large vessel occlusion or high-grade stenosis.    CTA H&N High grade R ICA origin stenosis. Small L ICA.  LE Venous Dopplers - LLE acute DVT  TCD with bubble study - no PFO  2D Echo EF 60-65%  Hold off other embolic stroke work up for now given on anticoagulation for DVT  [employer] Virus 2 - negative  LDL - 69  HgbA1c - 8.1  UDS - negative  VTE prophylaxis - Heparin IV   aspirin 81 mg daily prior to admission, now on aspirin 325 and heparin IV per stroke protocol   Therapy recommendations:  pending  Disposition:  Pending  Seizure   11/28 With GTC and left gaze, lasting 30 sec  12/1 with eye flattering and upper left gaze, resolved after 4mg  versed  EEG no seizure, generalized and localized encephalopathy  On keppra bid and Ativan PRN  Recurrent seizure around 7p 12/1 and again 4a 12/2  Keppra increased to 1500mg  bid  repeat EEG 12/2 encephalopathy but no seizure   LE DVT  LE Venous Dopplers - LLE acute DVT  Unclear etiology   On heparin drip per stroke protocol    CBC monitoring  Acute Hypoxemic Respiratory failure   Intubated off sedation  CCM on board  Not able to extubate  Encephalopathy due to metabolic acidosis, AKI, hypernatremia  Creatinine 3.81-1.55-1.31-1.05-1.10-1.18-1.25  Sodium [id number]  Off 1/2NS and free water, on LR  Nephrology on board  Hypertension  Home BP meds: Zestril ; Toprol-XL  Current BP meds: none   Stable on the low end . Long-term BP goal  normotensive  Hyperlipidemia  Home Lipid lowering medication: Lipitor 40 mg daily  LDL - 69, at goal < 70  Current lipid lowering medication: Lipitor 40 mg daily   Continue statin at discharge  Diabetes type II, uncontrolled  Home diabetic meds: Insulin ; metformin  Current diabetic meds: levemir  HgbA1c 8.1, goal < 7.0  SSI  CBG monitoring  On levimir  Other Stroke Risk Factors  Cigarette smoker - will advise to stop smoking  ETOH use, advised to drink no more than 1 alcoholic beverage per day.  Hx substance abuse  Obesity, Body mass index is 31.72 kg/m., recommend weight loss, diet and exercise as appropriate   Coronary artery disease  Substance Abuse hx - cocaine ; benzodiazepines    Medical non compliance  Other Active Problems  Acute hypoxemic respiratory failure on vent.  Acute toxic metabolic encephalopathy  Schizoaffective d/o  MDD  Hx suicide ateempts  Normocytic anemia  Thrombocytopenia   Hyperkalemia 5.2 after supplement  LUE blister  Hospital day # 7  This patient is critically ill due to left brain stroke, seizure, AKI, encephalopathy, respiratory failure and at significant risk of neurological worsening, death form recurrent stroke, cerebral edema, brain herniation, status epilepticus. This patient's care requires constant monitoring of vital signs, hemodynamics, respiratory and cardiac monitoring, review of multiple databases, neurological assessment, discussion with family, other specialists and medical decision making of high complexity. I spent 35 minutes of neurocritical care time in the care of this patient.  Rosalin Hawking, MD PhD Stroke Neurology 09/19/2019 9:17 AM  To contact Stroke Continuity provider, please refer to [email or web address] After hours, contact General Neurology

## 2019-09-19 NOTE — Progress Notes (Addendum)
ANTICOAGULATION CONSULT NOTE - Follow Up Consult  Pharmacy Consult for heparin Indication: DVT and stroke  Labs: Recent Labs    09/16/19 0306  09/17/19 0312  09/18/19 0345 09/18/19 0817 09/18/19 1545 09/18/19 2230  HGB 12.7*  --  10.3*  --  11.2*  --   --   --   HCT 37.6*  --  31.5*  --  33.5*  --   --   --   PLT PLATELET CLUMPS NOTED ON SMEAR, COUNT APPEARS DECREASED  --  104*  --  125*  --   --   --   HEPARINUNFRC  --    < >  --    < >  --  0.20* 0.30 0.30  CREATININE 1.31*  --  1.05  --  1.10  --   --   --    < > = values in this interval not displayed.    Assessment/Plan:  58yo male remains therapeutic on heparin. Will continue gtt at current rate and monitor daily level.   Wynona Neat, PharmD, BCPS  09/19/2019,12:03 AM   Addendum: AM heparin level now below goal at 0.21.  No gtt issues or signs of bleeding per RN.  Will increase heparin gtt slightly to 900 units/hr (was previously above goal at 950 units/hr) and check level in 6 hours.  VB 5:46 AM

## 2019-09-19 NOTE — Progress Notes (Signed)
Called earlier due to breakthrough seizure requiring ativan. By the time I arrived, gaze deviation and facial twitching had subsided. I will increase keppra to 1500mg  BID.   Roland Rack, MD [facility] [phone]  If 7pm- 7am, please page neurology on call as listed in [place].

## 2019-09-20 ENCOUNTER — Inpatient Hospital Stay (HOSPITAL_COMMUNITY): Payer: Medicare Other

## 2019-09-20 DIAGNOSIS — Z978 Presence of other specified devices: Secondary | ICD-10-CM | POA: Diagnosis not present

## 2019-09-20 DIAGNOSIS — I82402 Acute embolism and thrombosis of unspecified deep veins of left lower extremity: Secondary | ICD-10-CM

## 2019-09-20 DIAGNOSIS — N179 Acute kidney failure, unspecified: Secondary | ICD-10-CM | POA: Diagnosis not present

## 2019-09-20 LAB — HEPARIN LEVEL (UNFRACTIONATED)
Heparin Unfractionated: 0.32 IU/mL (ref 0.30–0.70)
Heparin Unfractionated: 0.37 IU/mL (ref 0.30–0.70)

## 2019-09-20 LAB — CBC
HCT: 33.7 % — ABNORMAL LOW (ref 39.0–52.0)
Hemoglobin: 10.7 g/dL — ABNORMAL LOW (ref 13.0–17.0)
MCH: 30.8 pg (ref 26.0–34.0)
MCHC: 31.8 g/dL (ref 30.0–36.0)
MCV: 97.1 fL (ref 80.0–100.0)
Platelets: 162 10*3/uL (ref 150–400)
RBC: 3.47 MIL/uL — ABNORMAL LOW (ref 4.22–5.81)
RDW: 13.9 % (ref 11.5–15.5)
WBC: 8.9 10*3/uL (ref 4.0–10.5)
nRBC: 0 % (ref 0.0–0.2)

## 2019-09-20 LAB — RENAL FUNCTION PANEL
Albumin: 2.3 g/dL — ABNORMAL LOW (ref 3.5–5.0)
Anion gap: 9 (ref 5–15)
BUN: 46 mg/dL — ABNORMAL HIGH (ref 6–20)
CO2: 23 mmol/L (ref 22–32)
Calcium: 9 mg/dL (ref 8.9–10.3)
Chloride: 112 mmol/L — ABNORMAL HIGH (ref 98–111)
Creatinine, Ser: 1.3 mg/dL — ABNORMAL HIGH (ref 0.61–1.24)
GFR calc Af Amer: >60 mL/min (ref >60)
GFR calc non Af Amer: 60 mL/min — ABNORMAL LOW (ref >60)
Glucose, Bld: 302 mg/dL — ABNORMAL HIGH (ref 70–99)
Phosphorus: 4.4 mg/dL (ref 2.5–4.6)
Potassium: 5.2 mmol/L — ABNORMAL HIGH (ref 3.5–5.1)
Sodium: 144 mmol/L (ref 135–145)

## 2019-09-20 LAB — GLUCOSE, CAPILLARY
Glucose-Capillary: 236 mg/dL — ABNORMAL HIGH (ref 70–99)
Glucose-Capillary: 266 mg/dL — ABNORMAL HIGH (ref 70–99)
Glucose-Capillary: 269 mg/dL — ABNORMAL HIGH (ref 70–99)
Glucose-Capillary: 289 mg/dL — ABNORMAL HIGH (ref 70–99)
Glucose-Capillary: 292 mg/dL — ABNORMAL HIGH (ref 70–99)
Glucose-Capillary: 324 mg/dL — ABNORMAL HIGH (ref 70–99)

## 2019-09-20 MED ORDER — SODIUM ZIRCONIUM CYCLOSILICATE 10 G PO PACK
10.0000 g | PACK | Freq: Once | ORAL | Status: AC
  Administered 2019-09-20: 10 g via ORAL
  Filled 2019-09-20: qty 1

## 2019-09-20 MED ORDER — METOPROLOL TARTRATE 25 MG/10 ML ORAL SUSPENSION
25.0000 mg | Freq: Two times a day (BID) | ORAL | Status: DC
  Administered 2019-09-20: 25 mg via NASOGASTRIC
  Filled 2019-09-20 (×2): qty 10

## 2019-09-20 MED ORDER — ISOSORBIDE MONONITRATE ER 30 MG PO TB24
30.0000 mg | ORAL_TABLET | Freq: Every day | ORAL | Status: DC
  Administered 2019-09-20 – 2019-09-27 (×6): 30 mg via ORAL
  Filled 2019-09-20 (×7): qty 1

## 2019-09-20 MED ORDER — INSULIN DETEMIR 100 UNIT/ML ~~LOC~~ SOLN
30.0000 [IU] | Freq: Two times a day (BID) | SUBCUTANEOUS | Status: DC
  Administered 2019-09-20 – 2019-09-21 (×2): 30 [IU] via SUBCUTANEOUS
  Filled 2019-09-20 (×3): qty 0.3

## 2019-09-20 NOTE — Progress Notes (Signed)
[place] for Heparin Indication: DVT and stroke  Allergies  Allergen Reactions  . Quetiapine Anaphylaxis  . Seroquel [Quetiapine Fumarate] Anxiety and Other (See Comments)    Reaction:  Nightmares   . Trazodone Anaphylaxis  . Trazodone And Nefazodone Anxiety and Other (See Comments)    Other reaction(s): Unknown Nightmares Reaction:  Nightmares   . Vistaril [Hydroxyzine Hcl] Nausea And Vomiting  . Haldol [Haloperidol Lactate] Anxiety and Other (See Comments)    Reaction:  Nightmares     Patient Measurements: Height: 6' (182.9 cm) Weight: 225 lb 1.4 oz (102.1 kg) IBW/kg (Calculated) : 77.6 Heparin Dosing Weight: 97.7 kg  Vital Signs: Temp: 100.1 F (37.8 C) (12/03 1116) Temp Source: Oral (12/03 1116) BP: 171/86 (12/03 1100) Pulse Rate: 105 (12/03 1115)  Labs: Recent Labs    09/18/19 0345  09/19/19 0427 09/19/19 0428  09/19/19 0950  09/19/19 2030 09/20/19 0414 09/20/19 1204  HGB 11.2*  --   --  10.7*  --   --   --   --  10.7*  --   HCT 33.5*  --   --  33.8*  --   --   --   --  33.7*  --   PLT 125*  --   --  137*  --   --   --   --  162  --   HEPARINUNFRC  --    < >  --   --    < >  --    < > 0.28* 0.32 0.37  CREATININE 1.10  --  1.18  --   --  1.25*  --   --  1.30*  --    < > = values in this interval not displayed.    Estimated Creatinine Clearance: 75.6 mL/min (A) (by C-G formula based on SCr of 1.3 mg/dL (H)).   Assessment: 58 yr old male presented with acute onset of encephalopathy - found to have seizure-like activity, with CT head showing infarcts in L hemisphere. Also, duplex finding acute DVT in L peroneal, common femoral, proximal profunda, and popliteal veins. No anticoagulation PTA.  Pharmacy consulted to dose heparin.  Heparin level drawn increasing heparin infusion to 900 units/hr earlier today is 0.23 units/ml, which is below the goal range for this patient (targeting lower goal per protocol with acute CVA).  Hgb low but stable, plt improved to 137. No bleeding or issues with infusion, per discussion with RN.  Heparin level therapeutic x2 s/p rate increase   Goal of Therapy:  Heparin level 0.3-0.5 units/ml Monitor platelets by anticoagulation protocol: Yes   Plan:  Continue heparin gtt at 1050 units/hr Daily heparin level, CBC, s/s bleeding  Bertis Ruddy, PharmD Clinical Pharmacist Please check AMION for all [place] numbers 09/20/2019 12:31 PM

## 2019-09-20 NOTE — Progress Notes (Signed)
STROKE TEAM PROGRESS NOTE   INTERVAL HISTORY Pt RN reported that this morning pt had episode of right gaze and flattering of eyelids bilaterally with abrupt tachycardia, lasted a minute also. No ativan given. Otherwise, pt no neuro changes from yesterday. RN also reported similar episode last night and pt received ativan. Now on keppra 1500 bid and will put on LTM.    OBJECTIVE Vitals:   09/20/19 0800 09/20/19 0900 09/20/19 1000 09/20/19 1005  BP: (!) 156/106 (!) 162/94 (!) 172/104 (!) 186/98  Pulse: (!) 109 (!) 113 (!) 116 (!) 110  Resp: (!) 22 (!) 22 (!) 21 17  Temp: 99 F (37.2 C)     TempSrc: Oral     SpO2: 100% 100% 100% 100%  Weight:      Height:        CBC:  Recent Labs  Lab 09/14/19 0718  09/19/19 0428 09/20/19 0414  WBC 8.2   < > 7.6 8.9  NEUTROABS 6.2  --   --   --   HGB 15.0   < > 10.7* 10.7*  HCT 44.7   < > 33.8* 33.7*  MCV 92.5   < > 96.6 97.1  PLT PLATELET CLUMPS NOTED ON SMEAR, COUNT APPEARS DECREASED   < > 137* 162   < > = values in this interval not displayed.    Basic Metabolic Panel:  Recent Labs  Lab 09/17/19 1023 09/18/19 0345 09/19/19 0427 09/19/19 0950 09/20/19 0414  NA  --  141 142 145 144  K  --  4.9 5.2* 5.1 5.2*  CL  --  109 108 111 112*  CO2  --  24 24 24 23   GLUCOSE  --  284* 285* 291* 302*  BUN  --  40* 38* 39* 46*  CREATININE  --  1.10 1.18 1.25* 1.30*  CALCIUM  --  8.6* 8.7* 8.8* 9.0  MG 2.0 2.0  --   --   --   PHOS  --  3.2 4.0  --  4.4    Lipid Panel:     Component Value Date/Time   CHOL 122 09/17/2019 0312   CHOL 184 11/11/2016 1007   TRIG 158 (H) 09/17/2019 0312   HDL 21 (L) 09/17/2019 0312   HDL 25 (L) 11/11/2016 1007   CHOLHDL 5.8 09/17/2019 0312   VLDL 32 09/17/2019 0312   LDLCALC 69 09/17/2019 0312   LDLCALC 118 (H) 11/11/2016 1007   HgbA1c:  Lab Results  Component Value Date   HGBA1C 8.1 (H) 09/13/2019   Urine Drug Screen:     Component Value Date/Time   LABOPIA NONE DETECTED 09/12/2019 1326    COCAINSCRNUR NONE DETECTED 09/12/2019 1326   COCAINSCRNUR NONE DETECTED 12/09/2015 2242   LABBENZ NONE DETECTED 09/12/2019 1326   AMPHETMU NONE DETECTED 09/12/2019 1326   THCU NONE DETECTED 09/12/2019 1326   LABBARB NONE DETECTED 09/12/2019 1326    Alcohol Level     Component Value Date/Time   ETH <10 09/12/2019 1326    IMAGING  Ct Angio Head W Or Wo Contrast Ct Angio Neck W Or Wo Contrast 09/17/2019 1. Negative for large vessel occlusion. Diminutive appearance of the intracranial circulation but no circle-of-Willis branch occlusion or significant intracranial stenosis identified. 2. Positive for severe stenosis vs. subtotal occlusion of the non-dominant proximal Right Vertebral Artery. This right vertebral terminates in PICA. 3. Positive also for high-grade Right ICA origin stenosis due to bulky calcified plaque, although the degree of this stenosis is numerically underestimated  due to diminutive downstream right ICA (3 mm diameter). 4. The left ICA is similarly diminutive, but with no superimposed stenosis despite atherosclerosis. 5. Progressive patchy hypodensity in the left frontal lobe since 09/15/2019 suggests ischemia rather than postictal changes on the MRI yesterday. No associated hemorrhage or mass effect. 6. No new intracranial abnormality.   Dg Chest Port 1 View 09/21/2019 pending  09/18/2019 1. No acute cardiopulmonary disease or significant interval change. 2. Stable support apparatus. 3. Low lung volumes.    Ct Head Wo Contrast 09/16/2019 IMPRESSION:  1. No acute intracranial hemorrhage.  2. Ill-defined areas of hypodensity in the left frontal convexity and left basal ganglia, new since the prior CT and concerning for acute infarct. Further evaluation with MRI is recommended.   Mr Brain 51 Contrast Mr Angio Head Wo Contrast Mr Angio Neck Wo Contrast 09/16/2019 IMPRESSION:  1. Large amount of abnormal diffusion restriction, likely postictal, within the left  hemisphere, predominantly cortical/subcortical.  2. Motion degraded MRA of the head and neck. No emergent large vessel occlusion or high-grade stenosis.   Vas [place] Lower Extremity Venous (dvt) 09/16/2019 Summary:  Right: There is no evidence of deep vein thrombosis in the lower extremity. No cystic structure found in the popliteal fossa.  Left: Findings consistent with acute deep vein thrombosis involving the left peroneal veins, left common femoral vein, left proximal profunda vein, and left popliteal vein. No cystic structure found in the popliteal fossa. Preliminary    Transthoracic Echocardiogram   1. Left ventricular ejection fraction, by visual estimation, is 60 to 65%. The left ventricle has normal function. Left ventricular septal wall thickness was mildly increased. Mildly increased left ventricular posterior wall thickness. There is mildly  increased left ventricular hypertrophy.  2. Global right ventricle has normal systolic function.The right ventricular size is normal. No increase in right ventricular wall thickness.  3. Left atrial size was normal.  4. Right atrial size was normal.  5. The mitral valve is normal in structure. Trace mitral valve regurgitation. No evidence of mitral stenosis.  6. The tricuspid valve is normal in structure. Tricuspid valve regurgitation is trivial.  7. The aortic valve is normal in structure. Aortic valve regurgitation is not visualized. No evidence of aortic valve sclerosis or stenosis.  8. The pulmonic valve was normal in structure. Pulmonic valve regurgitation is not visualized.  9. Normal pulmonary artery systolic pressure. 10. The inferior vena cava is normal in size with greater than 50% respiratory variability, suggesting right atrial pressure of 3 mmHg.  ECG - ST rate 122 BPM. (See cardiology reading for complete details)  EEG - This EEG recorded evidence of a generalized nonspecific cerebral dysfunction (encephalopathy) in addition to focal  dysfunction.  Though the prominence of slow activity over the right hemisphere would be suggestive of a focal right sided dysfunction, my suspicion is that this represents a generalized dysfunction with attenuation of the frequencies on the left due to his known stroke.  There is also evidence of left-sided dysfunction in the attenuation of the posterior dominant rhythm on that side. There was no seizure or seizure predisposition recorded on this study.    PHYSICAL EXAM  Blood pressure (!) 186/98, pulse (!) 110, temperature 99 F (37.2 C), temperature source Oral, resp. rate 17, height 6' (1.829 m), weight 102.1 kg, SpO2 100 %.  General - Well nourished, well developed, intubated no sedation.  Ophthalmologic - fundi not visualized due to noncooperation.  Cardiovascular - Regular rhythm but tachycardia.  Neuro - intubatednot  onsedation, eyes open on voice,notfollowing commands. With eye opening, eyes in left gaze preferenceposition, not cross midlin,blinking to visual threat on the left but not on the right, doll's eyespresent, trackingon the left, PERRL. Corneal reflexpositive on the left, absent on the right, gag and coughpresent. Breathing over the vent. Facial symmetry not able to test due to ET tube. Tongue protrusion not cooperative. On pain stimulation, no movement of RUE and slight withdraw on the BLE. LUE able to hold against gravity briefly on left bicep.DTR 1+ and bilateral positivebabinski. Sensation, coordination and gait not tested.  ASSESSMENT/PLAN Mr. ZEEV WIANT is a 58 y.o. male with history of alcohol dependence, benzodiazepine dependence, cocaine abuse, CAD, tobacco use, COPD, schizoaffective disorder, hypertension, and diabetes mellitus  presenting from jail with right sided weakness, left gaze deviation, encephalopathy, and seizure - found to have metabolic acidosis, uremic with acute renal failure  and possibly sepsis  He did not receive IV t-PA due to late  presentation (>4.5 hours from time of onset)  Stroke:  left MCA and ACA numerous scattered infarcts, embolic pattern, source unclear  CT head -  Ill-defined areas of hypodensity in the left frontal convexity and left basal ganglia, new since the prior CT and concerning for acute infarct.  MRI head -  Large amount of abnormal diffusion restriction within the left hemisphere, predominantly cortical/subcortical.  MRA H&N - Motion degraded MRA of the head and neck. No emergent large vessel occlusion or high-grade stenosis.    CTA H&N High grade R ICA origin stenosis. Small L ICA.  LE Venous Dopplers - LLE acute DVT  TCD with bubble study - no PFO  2D Echo EF 60-65%  Hold off other embolic stroke work up for now given on anticoagulation for DVT  [employer] Virus 2 - negative  LDL - 69  HgbA1c - 8.1  UDS - negative  VTE prophylaxis - Heparin IV   aspirin 81 mg daily prior to admission, now on aspirin 325 and heparin IV per stroke protocol   Therapy recommendations:  pending  Disposition:  Pending  Seizure   11/28 With GTC and left gaze, lasting 30 sec  12/1 with eye flattering and upper left gaze, resolved after 4mg  versed  EEG no seizure, generalized and localized encephalopathy  On keppra bid and Ativan PRN  Recurrent seizure-like activity around 7p 12/1, 4a 12/2, 7a 12/3  Keppra now at 1500mg  bid  repeat EEG 12/2 encephalopathy but no seizure   Now on LTM EEG  LE DVT  LE Venous Dopplers - LLE acute DVT  Unclear etiology   On heparin drip per stroke protocol    CBC monitoring  Acute Hypoxemic Respiratory failure   Intubated off sedation  CCM on board  Not able to extubate  Encephalopathy due to metabolic acidosis, AKI, hypernatremia  Creatinine 3.81-1.55-1.31-1.05-1.10-1.18-1.25-1.30   Sodium [id number]   Off 1/2NS and free water, on LR  Nephrology on board  Hypertension  Home BP meds: Zestril ; Toprol-XL  Current  BP meds: metoprolol 12.5 bid     Stable fluctuating . Long-term BP goal normotensive  Hyperlipidemia  Home Lipid lowering medication: Lipitor 40 mg daily  LDL - 69, at goal < 70  Current lipid lowering medication: Lipitor 40 mg daily   Continue statin at discharge  Diabetes type II, uncontrolled  Home diabetic meds: Insulin; metformin  Current diabetic meds: levemir  HgbA1c 8.1, goal < 7.0  SSI  CBG monitoring  On levimir  Other Stroke Risk Factors  Cigarette smoker - will advise to stop smoking  ETOH use, advised to drink no more than 1 alcoholic beverage per day.  Hx substance abuse  Obesity, Body mass index is 30.53 kg/m., recommend weight loss, diet and exercise as appropriate   Coronary artery disease  Substance Abuse hx - cocaine ; benzodiazepines    Medical non compliance  Other Active Problems  Acute hypoxemic respiratory failure on vent.  Acute toxic metabolic encephalopathy  Schizoaffective d/o  MDD  Hx suicide ateempts  Normocytic anemia  Thrombocytopenia   Hyperkalemia 5.2 after supplement    LUE blister  Hematuria Hb 10.7, monitor given need for IV heparin for acute DVT  Hospital day # 8  This patient is critically ill due to left brain stroke, seizure, AKI, encephalopathy, respiratory failure and at significant risk of neurological worsening, death form recurrent stroke, cerebral edema, brain herniation, status epilepticus. This patient's care requires constant monitoring of vital signs, hemodynamics, respiratory and cardiac monitoring, review of multiple databases, neurological assessment, discussion with family, other specialists and medical decision making of high complexity. I spent 35 minutes of neurocritical care time in the care of this patient. I discussed with Dr. Ruthann Cancer from [place].   [name] Hawking, [name] Stroke Neurology 09/20/2019 10:52 AM  To contact Stroke Continuity provider, please refer to [email or web address] After hours,  contact General Neurology

## 2019-09-20 NOTE — Progress Notes (Signed)
NAME:  [name], MRN:  [id number], DOB:  May 06, 1961, LOS: 8 ADMISSION DATE:  09/12/2019, CONSULTATION DATE: 11/25 REFERRING MD:  Audria Nine, DO CHIEF COMPLAINT: Toxic ingestion, altered mental status  Brief History   [name] is a 58 year old gentleman with a history of alcohol abuse disorder, schizophrenia, diabetes who presented from jail for drunk and disorderly conduct and presented to [facility] emergency department on 11/25.  In jail he was found to be obtunded and tachypneic and was therefore brought to the ED. given his history of ethylene glycol toxicity, multiple suicide attempts, and labs consistent with metabolic acidosis and acute kidney injury, he was treated empirically as ethylene glycol toxicity.  He was intubated in the [facility] on admission and is now 39M ICU.  Over the course of his hospital stay he was passing his SBT's, but was unable to be extubated for mental status.  His urine and serum testing for ethylene glycol was a send out lab at [facility], and was negative.  His urine was positive only for ketones.  Fomepizole was stopped after 48 hours, and once his acid-base disorder had resolved. Imaging + Acute L MCA/ACA stroke. Patient has remained persistently encephalopathic requiring mechanical ventilation.   Consults:  Nephrology, off neurology  Micro Data:  11/25, Covid negative 11/25 BCx> no growth  11/26 sputum culture rare gram-positive cocci 11/26 MRSA PCR negative 11/26 tracheal aspirate> normal flora   Significant Diagnostic Tests:  11/25 CT H> no acute intracranial abnormality 11/28 CT H> L frontal hypodensity 11/29 MRI/A h/n>L hemispheric abnormal diffusion restriction. MRA without large vessel occlusion or stenosis however exam limited by motion degradation  11/29 ECHO> LVEF 60-65%, no septal defect 11/30 CT angio head/neck> Progressive patchy hypodensity L frontal lobe, high-grade R ICA stenosis, Stenosis vs subtotal occlusion of non-dominant  proximal R vertebral artery, no large vessel occlusion  12/1 CXR> Low lung volumes, no focal ASD  EEG 12/2>>> This study is suggestive of cortical dysfunction in left hemisphere likely secondary to underlying abnormality.  In addition there is evidence of moderate diffuse encephalopathy, nonspecific etiology. No seizures or epileptiform discharges were seen throughout the recording.  Antimicrobials:  S/p metronidazole x1 Received 48 hours empiric vancomycin and cefepime  Interim history/subjective:  Some hematuria. Remains on heparin gtt for DVT. Tol PS 5/5.   Objective   Blood pressure (!) 156/106, pulse (!) 109, temperature 99 F (37.2 C), temperature source Oral, resp. rate (!) 22, height 6' (1.829 m), weight 102.1 kg, SpO2 100 %.    Vent Mode: PSV;CPAP FiO2 (%):  [30 %] 30 % Set Rate:  [16 bmp] 16 bmp Vt Set:  [600 mL] 600 mL PEEP:  [5 cmH20] 5 cmH20 Pressure Support:  [5 cmH20] 5 cmH20 Plateau Pressure:  [15 cmH20-16 cmH20] 16 cmH20   Intake/Output Summary (Last 24 hours) at 09/20/2019 0840 Last data filed at 09/20/2019 0800 Gross per 24 hour  Intake 1907.09 ml  Output 1050 ml  Net 857.09 ml   Filed Weights   09/18/19 0237 09/19/19 0412 09/20/19 0410  Weight: 103.7 kg 106.1 kg 102.1 kg    Examination: General:  Chronically ill appearing, NAD on vent  HENT:  ETT, mm moist  Lungs: resps even non labored on PS 5/5, few scattered rhonchi Cardiac: SR on telemetry, cool lower extremity, BUE Edema R>L Abdomen: obese, soft, non tender, nondistended Extremities: No deformities Neuro: minimally interactive, opens intermittently, does not follow commands RUE flaccid.   Assessment & Plan:   Acute ischemic  stroke -L frontal infarct; multiple punctate areas of restriction diffusion vs embolic infarcts -High-grade R ICA stenosis, severe stenosis vs subtotal occlusion of non-dominant proximal R vertebral artery  -Secondary stroke work up: LDL 69, hgbA1c: 81, TTE: No PFO.  + Lower  extremity DVT  P: -Neurology following -ASA/Statin per Neuro.  Heparin drip for LE DVT  New onset tonic-clonic seizure -EEG without seizure; but with general and focal dysfunction-- L sided dysfunction and attenuation, R sided dysfunction, likely in setting of known stroke P -Neurology following -Keppra increased to 1500 mg BID  -no further seizure on most recent EEG  Acute hypoxemic respiratory failure, improving P: Vent support - 8cc/kg  F/u CXR  F/u ABG Continue PS wean as tolerated although mental status prohibits extubation for now    Toxic metabolic encephalopathy -ethylene glycol neg at [facility] -acetone level mildly elevated  P - Thiamine - Delirium prevention  Hypertension -Home meds: Imdur 30 mg daily, lisinopril 10 mg daily, metoprolol 25 mg BID P: -Resume low dose metoprolol today  AKI, improved P -Continue supportive care and monitor I/O  DM2 -persistent hyperglycemia P -Resistant SSI + Scheduled insulin 7 units q 4 hours -levemir 25 units BID  Hx EtOH abuse P -thiamine, folate   LLE DVT P -IV heparin per pharmacy   Thrombocytopenia, mild -stable  P: -No acute intereventions  Schizoaffective disorder -Home meds: Hydroxyzine and Risperdone (newly prescribed) MDD -h/o multiple suicide attempts P  -holding home meds in setting of encephalopathy.  Will need psych eval once neurological exam improved  LUE blister P -qD and PRN clean with warm soapy water, cover with thin layer of bacitracin and xeroform   Best practice:  Diet: TF Pain/Anxiety/Delirium protocol (if indicated): PRN fentanyl VAP protocol (if indicated): Yes DVT prophylaxis: heparin gtt GI prophylaxis: PPI Glucose control: SSI resistant, BID Levimir, EN coverage  Mobility: bed rest Code Status: full code Family Communication:  Will attempt to contact: Berenice Primas: Niece [phone] Disposition: ICU   Labs   CBC: Recent Labs  Lab 09/14/19 0718  09/16/19 0306 09/17/19  0312 09/18/19 0345 09/19/19 0428 09/20/19 0414  WBC 8.2   < > 6.9 5.6 7.5 7.6 8.9  NEUTROABS 6.2  --   --   --   --   --   --   HGB 15.0   < > 12.7* 10.3* 11.2* 10.7* 10.7*  HCT 44.7   < > 37.6* 31.5* 33.5* 33.8* 33.7*  MCV 92.5   < > 91.7 93.5 93.1 96.6 97.1  PLT PLATELET CLUMPS NOTED ON SMEAR, COUNT APPEARS DECREASED   < > PLATELET CLUMPS NOTED ON SMEAR, COUNT APPEARS DECREASED 104* 125* 137* 162   < > = values in this interval not displayed.    Basic Metabolic Panel: Recent Labs  Lab 09/14/19 2105 09/15/19 0536 09/15/19 1713 09/16/19 0306 09/17/19 0312 09/17/19 1023 09/18/19 0345 09/19/19 0427 09/19/19 0950 09/20/19 0414  NA  --  146*  --  146* 133*  --  141 142 145 144  K  --  3.4*  --  3.6 3.4*  --  4.9 5.2* 5.1 5.2*  CL  --  109  --  106 103  --  109 108 111 112*  CO2  --  21*  --  25 23  --  24 24 24 23   GLUCOSE  --  332*  --  205* 234*  --  284* 285* 291* 302*  BUN  --  62*  --  55* 41*  --  40* 38* 39* 46*  CREATININE  --  1.55*  --  1.31* 1.05  --  1.10 1.18 1.25* 1.30*  CALCIUM  --  8.6*  --  8.7* 7.2*  --  8.6* 8.7* 8.8* 9.0  MG 2.3 2.2 2.2  --   --  2.0 2.0  --   --   --   PHOS 2.6 1.9* 3.3 2.8 2.3*  --  3.2 4.0  --  4.4   GFR: Estimated Creatinine Clearance: 75.6 mL/min (A) (by C-G formula based on SCr of 1.3 mg/dL (H)). Recent Labs  Lab 09/17/19 0312 09/18/19 0345 09/19/19 0428 09/20/19 0414  WBC 5.6 7.5 7.6 8.9    Liver Function Tests: Recent Labs  Lab 09/14/19 0718 09/16/19 0306 09/17/19 0312 09/18/19 0345 09/19/19 0427 09/20/19 0414  AST 101*  --   --  28  --   --   ALT 57*  --   --  38  --   --   ALKPHOS 49  --   --  55  --   --   BILITOT 1.3*  --   --  0.4  --   --   PROT 5.7*  --   --  5.1*  --   --   ALBUMIN 2.7* 2.5* 1.9* 2.3* 2.2* 2.3*   No results for input(s): LIPASE, AMYLASE in the last 168 hours. No results for input(s): AMMONIA in the last 168 hours.  ABG    Component Value Date/Time   PHART 7.377 09/13/2019 0559    PCO2ART 32.5 09/13/2019 0559   PO2ART 118 (H) 09/13/2019 0559   HCO3 18.4 (L) 09/13/2019 0559   TCO2 19 (L) 05/30/2019 0535   ACIDBASEDEF 5.6 (H) 09/13/2019 0559   O2SAT 98.0 09/13/2019 0559     Coagulation Profile: No results for input(s): INR, PROTIME in the last 168 hours.  Cardiac Enzymes: No results for input(s): CKTOTAL, CKMB, CKMBINDEX, TROPONINI in the last 168 hours.  HbA1C: HB A1C (BAYER DCA - WAIVED)  Date/Time Value Ref Range Status  11/11/2016 11:32 AM 7.6 (H) <7.0 % Final    Comment:                                          Diabetic Adult            <7.0                                       Healthy Adult        4.3 - 5.7                                                           (DCCT/NGSP) American Diabetes Association's Summary of Glycemic Recommendations for Adults with Diabetes: Hemoglobin A1c <7.0%. More stringent glycemic goals (A1c <6.0%) may further reduce complications at the cost of increased risk of hypoglycemia.    Hgb A1c MFr Bld  Date/Time Value Ref Range Status  09/13/2019 05:45 AM 8.1 (H) 4.8 - 5.6 % Final    Comment:    (NOTE) Pre diabetes:  5.7%-6.4% Diabetes:              >6.4% Glycemic control for   <7.0% adults with diabetes   05/03/2019 09:40 AM 8.8 (H) 4.8 - 5.6 % Final    Comment:    (NOTE) Pre diabetes:          5.7%-6.4% Diabetes:              >6.4% Glycemic control for   <7.0% adults with diabetes     CBG: Recent Labs  Lab 09/19/19 1459 09/19/19 1931 09/19/19 2340 09/20/19 0420 09/20/19 0743  GLUCAP 239* 235* 275* 266* 269*    Total critical care time: 78mins  Critical care time was exclusive of separately billable procedures and treating other patients. Critical care was necessary to treat or prevent imminent or life-threatening deterioration.  Critical care was time spent personally by me on the following activities: development of treatment plan with patient and/or surrogate as well as nursing,  discussions with consultants, evaluation of patient's response to treatment, examination of patient, obtaining history from patient or surrogate, ordering and performing treatments and interventions, ordering and review of laboratory studies, ordering and review of radiographic studies, pulse oximetry and re-evaluation of patient's condition.  Nickolas Madrid, NP Pulmonary/Critical Care Medicine  09/20/2019  8:40 AM

## 2019-09-20 NOTE — Progress Notes (Addendum)
[place] for Heparin Indication: DVT and stroke  Allergies  Allergen Reactions  . Quetiapine Anaphylaxis  . Seroquel [Quetiapine Fumarate] Anxiety and Other (See Comments)    Reaction:  Nightmares   . Trazodone Anaphylaxis  . Trazodone And Nefazodone Anxiety and Other (See Comments)    Other reaction(s): Unknown Nightmares Reaction:  Nightmares   . Vistaril [Hydroxyzine Hcl] Nausea And Vomiting  . Haldol [Haloperidol Lactate] Anxiety and Other (See Comments)    Reaction:  Nightmares     Patient Measurements: Height: 6' (182.9 cm) Weight: 225 lb 1.4 oz (102.1 kg) IBW/kg (Calculated) : 77.6 Heparin Dosing Weight: 97.7 kg  Vital Signs: Temp: 99.9 F (37.7 C) (12/03 0000) Temp Source: Axillary (12/03 0000) BP: 111/75 (12/03 0408) Pulse Rate: 106 (12/03 0408)  Labs: Recent Labs    09/18/19 0345  09/19/19 0427 09/19/19 0428  09/19/19 0950 09/19/19 1245 09/19/19 2030 09/20/19 0414  HGB 11.2*  --   --  10.7*  --   --   --   --  10.7*  HCT 33.5*  --   --  33.8*  --   --   --   --  33.7*  PLT 125*  --   --  137*  --   --   --   --  162  HEPARINUNFRC  --    < >  --   --    < >  --  0.23* 0.28* 0.32  CREATININE 1.10  --  1.18  --   --  1.25*  --   --  1.30*   < > = values in this interval not displayed.    Estimated Creatinine Clearance: 75.6 mL/min (A) (by C-G formula based on SCr of 1.3 mg/dL (H)).   Assessment: 58 yr old male presented with acute onset of encephalopathy - found to have seizure-like activity, with CT head showing infarcts in L hemisphere. Also, duplex finding acute DVT in L peroneal, common femoral, proximal profunda, and popliteal veins. No anticoagulation PTA.  Pharmacy consulted to dose heparin.  Heparin level drawn increasing heparin infusion to 900 units/hr earlier today is 0.23 units/ml, which is below the goal range for this patient (targeting lower goal per protocol with acute CVA). Hgb low but stable, plt  improved to 137. No bleeding or issues with infusion, per discussion with RN.  12/3 AM update:  Heparin level therapeutic after rate increase   Goal of Therapy:  Heparin level 0.3-0.5 units/ml Monitor platelets by anticoagulation protocol: Yes   Plan:  -Cont heparin at 1050 units/hr -Confirmatory heparin level at [place], PharmD, [place] Pharmacist Phone: [phone]

## 2019-09-20 NOTE — Plan of Care (Signed)
  Problem: Education: Goal: Knowledge of General Education information will improve Description: Including pain rating scale, medication(s)/side effects and non-pharmacologic comfort measures Outcome: Not Progressing   

## 2019-09-20 NOTE — Progress Notes (Signed)
RT NOTES: Patient placed back on full support at this time d/t seizure-like activity and increased work of breathing.

## 2019-09-20 NOTE — Progress Notes (Signed)
LTM EEG reviewed from 1239 to 1425.  No definite seizures seen.  We will continue to monitor.  Please review final report for details.

## 2019-09-20 NOTE — Progress Notes (Signed)
Inpatient Diabetes Program Recommendations  AACE/ADA: New Consensus Statement on Inpatient Glycemic Control (2015)  Target Ranges:  Prepandial:   less than 140 mg/dL      Peak postprandial:   less than 180 mg/dL (1-2 hours)      Critically ill patients:  140 - 180 mg/dL   Lab Results  Component Value Date   GLUCAP 269 (H) 09/20/2019   HGBA1C 8.1 (H) 09/13/2019    Review of Glycemic Control Results for [name], [name] (MRN [id number]) as of 09/20/2019 10:05  Ref. Range 09/19/2019 19:31 09/19/2019 23:40 09/20/2019 04:20 09/20/2019 07:43  Glucose-Capillary Latest Ref Range: 70 - 99 mg/dL 235 (H) 275 (H) 266 (H) 269 (H)   Diabetes history: Type 2 DM Outpatient Diabetes medications: Novolog 1-30 units TID SSI, Basaglar 20 units QHS, Metformin 1000 mg BID Current orders for Inpatient glycemic control: Novolog 0-20 units TID, Novolog 7 units Q4H, Levemir-25 units BID  Inpatient Diabetes Program Recommendations:    Glucose trends continue to exceed 250's mg/dL.   Consider increasing Levemir to 30 units BID.  Thanks, Bronson Curb, MSN, RNC-OB Diabetes Coordinator [phone] (8a-5p)

## 2019-09-20 NOTE — Progress Notes (Signed)
LTM started; educated nurse on event button.

## 2019-09-21 ENCOUNTER — Inpatient Hospital Stay (HOSPITAL_COMMUNITY): Payer: Medicare Other

## 2019-09-21 DIAGNOSIS — J9601 Acute respiratory failure with hypoxia: Secondary | ICD-10-CM | POA: Diagnosis not present

## 2019-09-21 DIAGNOSIS — N179 Acute kidney failure, unspecified: Secondary | ICD-10-CM | POA: Diagnosis not present

## 2019-09-21 LAB — RENAL FUNCTION PANEL
Albumin: 2.3 g/dL — ABNORMAL LOW (ref 3.5–5.0)
Anion gap: 9 (ref 5–15)
BUN: 46 mg/dL — ABNORMAL HIGH (ref 6–20)
CO2: 24 mmol/L (ref 22–32)
Calcium: 9.1 mg/dL (ref 8.9–10.3)
Chloride: 113 mmol/L — ABNORMAL HIGH (ref 98–111)
Creatinine, Ser: 1.42 mg/dL — ABNORMAL HIGH (ref 0.61–1.24)
GFR calc Af Amer: >60 mL/min (ref >60)
GFR calc non Af Amer: 54 mL/min — ABNORMAL LOW (ref >60)
Glucose, Bld: 293 mg/dL — ABNORMAL HIGH (ref 70–99)
Phosphorus: 4.8 mg/dL — ABNORMAL HIGH (ref 2.5–4.6)
Potassium: 5 mmol/L (ref 3.5–5.1)
Sodium: 146 mmol/L — ABNORMAL HIGH (ref 135–145)

## 2019-09-21 LAB — CBC
HCT: 33 % — ABNORMAL LOW (ref 39.0–52.0)
Hemoglobin: 10.3 g/dL — ABNORMAL LOW (ref 13.0–17.0)
MCH: 30.4 pg (ref 26.0–34.0)
MCHC: 31.2 g/dL (ref 30.0–36.0)
MCV: 97.3 fL (ref 80.0–100.0)
Platelets: 195 10*3/uL (ref 150–400)
RBC: 3.39 MIL/uL — ABNORMAL LOW (ref 4.22–5.81)
RDW: 14 % (ref 11.5–15.5)
WBC: 8.2 10*3/uL (ref 4.0–10.5)
nRBC: 0 % (ref 0.0–0.2)

## 2019-09-21 LAB — PHENYTOIN LEVEL, TOTAL
Phenytoin Lvl: >100 ug/mL (ref 10.0–20.0)
Phenytoin Lvl: 23 ug/mL — ABNORMAL HIGH (ref 10.0–20.0)

## 2019-09-21 LAB — GLUCOSE, CAPILLARY
Glucose-Capillary: 278 mg/dL — ABNORMAL HIGH (ref 70–99)
Glucose-Capillary: 250 mg/dL — ABNORMAL HIGH (ref 70–99)
Glucose-Capillary: 236 mg/dL — ABNORMAL HIGH (ref 70–99)
Glucose-Capillary: 234 mg/dL — ABNORMAL HIGH (ref 70–99)
Glucose-Capillary: 241 mg/dL — ABNORMAL HIGH (ref 70–99)

## 2019-09-21 LAB — HEPARIN LEVEL (UNFRACTIONATED): Heparin Unfractionated: 0.33 IU/mL (ref 0.30–0.70)

## 2019-09-21 MED ORDER — SODIUM CHLORIDE 0.9 % IV SOLN
1500.0000 mg | INTRAVENOUS | Status: AC
  Administered 2019-09-21: 1500 mg via INTRAVENOUS
  Filled 2019-09-21: qty 30

## 2019-09-21 MED ORDER — LIDOCAINE HCL (PF) 1 % IJ SOLN
2.0000 mL | Freq: Once | INTRAMUSCULAR | Status: AC
  Administered 2019-09-21: 15:00:00 2 mL

## 2019-09-21 MED ORDER — ROCURONIUM BROMIDE 50 MG/5ML IV SOLN: 100.0000 mg | Freq: Once | INTRAVENOUS | Status: AC

## 2019-09-21 MED ORDER — VITAL AF 1.2 CAL PO LIQD
1000.0000 mL | ORAL | Status: DC
  Administered 2019-09-21 – 2019-09-23 (×3): 1000 mL
  Filled 2019-09-21 (×2): qty 1000

## 2019-09-21 MED ORDER — PRO-STAT SUGAR FREE PO LIQD
30.0000 mL | Freq: Three times a day (TID) | ORAL | Status: DC
  Administered 2019-09-21 – 2019-09-25 (×7): 30 mL
  Filled 2019-09-21 (×6): qty 30

## 2019-09-21 MED ORDER — INSULIN DETEMIR 100 UNIT/ML ~~LOC~~ SOLN
35.0000 [IU] | Freq: Two times a day (BID) | SUBCUTANEOUS | Status: DC
  Administered 2019-09-21 – 2019-09-23 (×4): 35 [IU] via SUBCUTANEOUS
  Filled 2019-09-21 (×5): qty 0.35

## 2019-09-21 MED ORDER — MIDAZOLAM HCL 2 MG/2ML IJ SOLN
INTRAMUSCULAR | Status: AC
  Administered 2019-09-21: 3 mg via INTRAVENOUS
  Filled 2019-09-21: qty 4

## 2019-09-21 MED ORDER — LIDOCAINE HCL (PF) 1 % IJ SOLN
INTRAMUSCULAR | Status: AC
  Administered 2019-09-21: 2 mL
  Filled 2019-09-21: qty 5

## 2019-09-21 MED ORDER — METOPROLOL TARTRATE 25 MG/10 ML ORAL SUSPENSION
37.5000 mg | Freq: Two times a day (BID) | ORAL | Status: DC
  Administered 2019-09-21 – 2019-09-27 (×9): 37.5 mg via NASOGASTRIC
  Filled 2019-09-21: qty 20
  Filled 2019-09-21 (×2): qty 15
  Filled 2019-09-21: qty 20
  Filled 2019-09-21 (×3): qty 15
  Filled 2019-09-21: qty 20
  Filled 2019-09-21: qty 15
  Filled 2019-09-21: qty 20
  Filled 2019-09-21: qty 15

## 2019-09-21 MED ORDER — FREE WATER
100.0000 mL | Status: AC
  Administered 2019-09-21 – 2019-09-22 (×12): 100 mL

## 2019-09-21 MED ORDER — FREE WATER: 100.0000 mL | Status: DC

## 2019-09-21 MED ORDER — FENTANYL CITRATE (PF) 100 MCG/2ML IJ SOLN
INTRAMUSCULAR | Status: AC
  Administered 2019-09-21: 100 ug via INTRAVENOUS
  Filled 2019-09-21: qty 2

## 2019-09-21 MED ORDER — PHENYTOIN SODIUM 50 MG/ML IJ SOLN
100.0000 mg | Freq: Three times a day (TID) | INTRAMUSCULAR | Status: DC
  Administered 2019-09-21 – 2019-09-23 (×6): 100 mg via INTRAVENOUS
  Filled 2019-09-21 (×6): qty 2

## 2019-09-21 MED ORDER — ROCURONIUM BROMIDE 10 MG/ML (PF) SYRINGE
PREFILLED_SYRINGE | INTRAVENOUS | Status: AC
  Administered 2019-09-21: 100 mg
  Filled 2019-09-21: qty 10

## 2019-09-21 MED ORDER — FENTANYL CITRATE (PF) 100 MCG/2ML IJ SOLN
100.0000 ug | Freq: Once | INTRAMUSCULAR | Status: AC
  Administered 2019-09-21: 15:00:00 100 ug via INTRAVENOUS

## 2019-09-21 MED ORDER — MIDAZOLAM HCL 2 MG/2ML IJ SOLN
3.0000 mg | Freq: Once | INTRAMUSCULAR | Status: AC
  Administered 2019-09-21: 15:00:00 3 mg via INTRAVENOUS

## 2019-09-21 NOTE — Progress Notes (Signed)
[facility] for Heparin Indication: DVT and stroke  Allergies  Allergen Reactions  . Quetiapine Anaphylaxis  . Seroquel [Quetiapine Fumarate] Anxiety and Other (See Comments)    Reaction:  Nightmares   . Trazodone Anaphylaxis  . Trazodone And Nefazodone Anxiety and Other (See Comments)    Other reaction(s): Unknown Nightmares Reaction:  Nightmares   . Vistaril [Hydroxyzine Hcl] Nausea And Vomiting  . Haldol [Haloperidol Lactate] Anxiety and Other (See Comments)    Reaction:  Nightmares     Patient Measurements: Height: 6' (182.9 cm) Weight: 228 lb 2.8 oz (103.5 kg) IBW/kg (Calculated) : 77.6 Heparin Dosing Weight: 97.7 kg  Vital Signs: Temp: 98.8 F (37.1 C) (12/03 2349) Temp Source: Oral (12/03 2349) BP: 154/100 (12/04 0500) Pulse Rate: 105 (12/04 0500)  Labs: Recent Labs    09/19/19 0428  09/19/19 0950  09/20/19 0414 09/20/19 1204 09/21/19 0421  HGB 10.7*  --   --   --  10.7*  --  10.3*  HCT 33.8*  --   --   --  33.7*  --  33.0*  PLT 137*  --   --   --  162  --  195  HEPARINUNFRC  --    < >  --    < > 0.32 0.37 0.33  CREATININE  --   --  1.25*  --  1.30*  --  1.42*   < > = values in this interval not displayed.    Estimated Creatinine Clearance: 69.7 mL/min (A) (by C-G formula based on SCr of 1.42 mg/dL (H)).   Assessment: 58 yr old male presented with acute onset of encephalopathy - found to have seizure-like activity, with CT head showing infarcts in L hemisphere. Also, duplex finding acute DVT in L peroneal, common femoral, proximal profunda, and popliteal veins. No anticoagulation PTA.  Pharmacy consulted to dose heparin.  Heparin level remains therapeutic, H/H stable, plts up to 195, no bleeding reported.     Goal of Therapy:  Heparin level 0.3-0.5 units/ml Monitor platelets by anticoagulation protocol: Yes   Plan:  Continue heparin gtt at 1050 units/hr Daily heparin level, CBC, s/s bleeding  Bertis Ruddy,  PharmD Clinical Pharmacist Please check AMION for all [place] numbers 09/21/2019 6:59 AM

## 2019-09-21 NOTE — Progress Notes (Signed)
Inpatient Diabetes Program Recommendations  AACE/ADA: New Consensus Statement on Inpatient Glycemic Control (2015)  Target Ranges:  Prepandial:   less than 140 mg/dL      Peak postprandial:   less than 180 mg/dL (1-2 hours)      Critically ill patients:  140 - 180 mg/dL   Lab Results  Component Value Date   GLUCAP 250 (H) 09/21/2019   HGBA1C 8.1 (H) 09/13/2019    Review of Glycemic Control Results for [name], [name] (MRN [id number]) as of 09/21/2019 11:49  Ref. Range 09/20/2019 07:43 09/20/2019 10:55 09/20/2019 15:59 09/20/2019 19:41 09/20/2019 23:44 09/21/2019 04:14 09/21/2019 07:43  Glucose-Capillary Latest Ref Range: 70 - 99 mg/dL 269 (H) 236 (H) 289 (H) 324 (H) 292 (H) 278 (H) 250 (H)    Diabetes history: Type 2 DM Outpatient Diabetes medications: Novolog 1-30 units TID SSI, Basaglar 20 units QHS, Metformin 1000 mg BID Current orders for Inpatient glycemic control:  Novolog 0-20 units Q4H Novolog 7 units Q4H Levemir 30 units BID  Vital 1.5 55 ml/hour BUN/Creat: 46/1.42  Inpatient Diabetes Program Recommendations:    Glucose trends continue to exceed 250's mg/dL.   -  Consider increasing Levemir to 35 units BID  -  Consider increasing Novolog Tube Feed coverage to 10 units Q4H  Thanks, Tama Headings RN, MSN, BC-ADM Inpatient Diabetes Coordinator Team Pager [phone] (8a-5p)

## 2019-09-21 NOTE — Procedures (Signed)
Bronchoscopy Procedure Note SABASTION HRDLICKA [id number] [birth year]  Procedure: Bronchoscopy Indications: Diagnostic evaluation of the airways, Obtain specimens for culture and/or other diagnostic studies and Remove secretions  Procedure Details Consent: Unable to obtain consent because of emergent medical necessity. Time Out: Verified patient identification, verified procedure, site/side was marked, verified correct patient position, special equipment/implants available, medications/allergies/relevent history reviewed, required imaging and test results available.  Performed  In preparation for procedure, patient was given 100% FiO2, bronchoscope lubricated and lidocaine given via ETT (2 ml). Sedation: Benzodiazepines, Muscle relaxants and fentanyl  Airway entered and the following bronchi were examined: RUL, RML, RLL, LUL, LLL and Bronchi.  Copious clear secretions in central airways. Small RUL bronchus with purulent secretions lavaged. Minimal secretions otherwise and no endobrochial lesions, but airways ++ compliant.  Procedures performed: BAL performed Bronchoscope removed.  , Patient placed back on 100% FiO2 at conclusion of procedure.    Evaluation Hemodynamic Status: BP stable throughout; O2 sats: stable throughout Patient's Current Condition: stable Specimens:  Sent purulent fluid Complications: No apparent complications Patient did tolerate procedure well.   Einar Grad Lateasha Breuer 09/21/2019

## 2019-09-21 NOTE — Progress Notes (Signed)
Reason for consult: Seizure  Subjective: Patient had multiple seizures arising from left hemisphere overnight.  Awake currently but not following commands.   ROS: unable to obtain due to poor mental status  Examination  Vital signs in last 24 hours: Temp:  [98.4 F (36.9 C)-99.3 F (37.4 C)] 98.7 F (37.1 C) (12/04 0736) Pulse Rate:  [60-119] 107 (12/04 1114) Resp:  [16-28] 17 (12/04 1114) BP: (100-175)/(56-100) 122/82 (12/04 1114) SpO2:  [95 %-100 %] 100 % (12/04 1114) FiO2 (%):  [30 %] 30 % (12/04 1114) Weight:  [103.5 kg] 103.5 kg (12/04 0500)  General: lying in bed, not in apparent distress CVS: pulse-normal rate and rhythm RS: breathing comfortably, intubated Extremities: +edematous  Neuro: MS: Alert, tracks examiner in room, does not follow commands CN: pupils equal and reactive,  EOMI, unable to assess facial symmetry due to ET tube.  Rest of the cranial nerves difficult to assess secondary to intubation  Motor: Withdraws to noxious stimuli with antigravity strength in bilateral upper extremities, withdraws to noxious stimuli in bilateral lower extremities Reflexes: 1+ bilaterally over patella, biceps, plantars: Upgoing   Basic Metabolic Panel: Recent Labs  Lab 09/14/19 2105 09/15/19 0536 09/15/19 1713  09/17/19 0312 09/17/19 1023 09/18/19 0345 09/19/19 0427 09/19/19 0950 09/20/19 0414 09/21/19 0421  NA  --  146*  --    < > 133*  --  141 142 145 144 146*  K  --  3.4*  --    < > 3.4*  --  4.9 5.2* 5.1 5.2* 5.0  CL  --  109  --    < > 103  --  109 108 111 112* 113*  CO2  --  21*  --    < > 23  --  24 24 24 23 24   GLUCOSE  --  332*  --    < > 234*  --  284* 285* 291* 302* 293*  BUN  --  62*  --    < > 41*  --  40* 38* 39* 46* 46*  CREATININE  --  1.55*  --    < > 1.05  --  1.10 1.18 1.25* 1.30* 1.42*  CALCIUM  --  8.6*  --    < > 7.2*  --  8.6* 8.7* 8.8* 9.0 9.1  MG 2.3 2.2 2.2  --   --  2.0 2.0  --   --   --   --   PHOS 2.6 1.9* 3.3   < > 2.3*  --  3.2 4.0   --  4.4 4.8*   < > = values in this interval not displayed.    CBC: Recent Labs  Lab 09/17/19 0312 09/18/19 0345 09/19/19 0428 09/20/19 0414 09/21/19 0421  WBC 5.6 7.5 7.6 8.9 8.2  HGB 10.3* 11.2* 10.7* 10.7* 10.3*  HCT 31.5* 33.5* 33.8* 33.7* 33.0*  MCV 93.5 93.1 96.6 97.1 97.3  PLT 104* 125* 137* 162 195     Coagulation Studies: No results for input(s): LABPROT, INR in the last 72 hours.  Imaging MR Brain: Large amount of abnormal diffusion restriction, likely postictal, within the left hemisphere, predominantly cortical/subcortical.   ASSESSMENT AND PLAN [name] is a 58 y.o. male with history of alcohol dependence, benzodiazepine dependence, cocaine abuse, CAD, tobacco use, COPD, schizoaffective disorder, hypertension, and diabetes mellitus  presenting from jail with right sided weakness, left gaze deviation, encephalopathy, and seizure - found to have metabolic acidosis,uremic with acute renal failure and possibly  sepsis  He did not receive IV t-PA due to late presentation (>4.5 hours from time of onset)  Seizure  -LTM EEG showed frequent seizures with right gaze deviation arising from left hemisphere. There were frequent subclinical seizures as well. -Patient already on Keppra 1500 mg twice daily.  Recommendations -We will load patient with IV fosphenytoin 1500 mg and start on phenytoin 100 mg 3 times daily.   - Will check postload level at 2 hours and consult pharmacy for phenytoin maintenance dosing with goal level 15-20. -Continue Keppra 1500 mg twice daily -Continue LTM EEG to monitor for subclinical seizures -If patient continues to have seizures, will start perampanel 16 mg followed by maintenance dose of 4 mg nightly. -If patient goes into convulsive status epilepticus, will need burst suppression with Versed and propofol. -Continue seizure precautions -As needed IV Ativan for generalized tonic-clonic seizure lasting more than 2 minutes, focal  seizure lasting more than 5 minutes -Stroke management per Dr.Xu's note on 09/20/2019 -Rest of the management per primary team.   CRITICAL CARE Performed by: Lora Havens   Total critical care time: 35 minutes  Critical care time was exclusive of separately billable procedures and treating other patients.  Critical care was necessary to treat or prevent imminent or life-threatening deterioration.  Critical care was time spent personally by me on the following activities: development of treatment plan with patient and/or surrogate as well as nursing, discussions with consultants, evaluation of patient's response to treatment, examination of patient, obtaining history from patient or surrogate, ordering and performing treatments and interventions, ordering and review of laboratory studies, ordering and review of radiographic studies, pulse oximetry and re-evaluation of patient's condition.

## 2019-09-21 NOTE — Progress Notes (Signed)
MEDICATION RELATED CONSULT NOTE - INITIAL   Pharmacy Consult for Phenytoin Indication: Seizures  Allergies  Allergen Reactions  . Quetiapine Anaphylaxis  . Seroquel [Quetiapine Fumarate] Anxiety and Other (See Comments)    Reaction:  Nightmares   . Trazodone Anaphylaxis  . Trazodone And Nefazodone Anxiety and Other (See Comments)    Other reaction(s): Unknown Nightmares Reaction:  Nightmares   . Vistaril [Hydroxyzine Hcl] Nausea And Vomiting  . Haldol [Haloperidol Lactate] Anxiety and Other (See Comments)    Reaction:  Nightmares     Patient Measurements: Height: 6' (182.9 cm) Weight: 228 lb 2.8 oz (103.5 kg) IBW/kg (Calculated) : 77.6 Adjusted Body Weight: 88kg  Vital Signs: Temp: 98.7 F (37.1 C) (12/04 0736) Temp Source: Axillary (12/04 0736) BP: 138/86 (12/04 0800) Pulse Rate: 106 (12/04 0800) Intake/Output from previous day: 12/03 0701 - 12/04 0700 In: 2524.9 [I.V.:729.9; NG/GT:1595; IV Piggyback:200] Out: 2900 [Urine:2900] Intake/Output from this shift: Total I/O In: 145.5 [I.V.:30.5; NG/GT:115] Out: -   Labs: Recent Labs    09/19/19 0427 09/19/19 0428 09/19/19 0950 09/20/19 0414 09/21/19 0421  WBC  --  7.6  --  8.9 8.2  HGB  --  10.7*  --  10.7* 10.3*  HCT  --  33.8*  --  33.7* 33.0*  PLT  --  137*  --  162 195  CREATININE 1.18  --  1.25* 1.30* 1.42*  PHOS 4.0  --   --  4.4 4.8*  ALBUMIN 2.2*  --   --  2.3* 2.3*   Estimated Creatinine Clearance: 69.7 mL/min (A) (by C-G formula based on SCr of 1.42 mg/dL (H)).   Assessment: 20 YOM with seizure like activity this admission, seizure on EEG and fosphenytoin load 1500mg  PE x 1, and starting phenytoin 100mg  IV every 8 hours per Neurology.  BL albumin 2.3, SCr 1.42, ABW 88kg.    Goal of Therapy:  Total phenytoin level 15-20 mcg/ml  Plan:  F/u total phenytoin level post-load Obtain phenytoin level in 5 days for maintenance dose adjustment F/u Neuro recs and further seizure activity  Bertis Ruddy, PharmD Clinical Pharmacist Please check AMION for all [place] numbers 09/21/2019 9:07 AM

## 2019-09-21 NOTE — Progress Notes (Signed)
NAME:  [name], MRN:  [id number], DOB:  28-Jul-1961, LOS: 9 ADMISSION DATE:  09/12/2019, CONSULTATION DATE: 11/25 REFERRING MD:  Audria Nine, DO CHIEF COMPLAINT: Toxic ingestion, altered mental status  Brief History   [name] is a 58 year old gentleman with a history of alcohol abuse disorder, schizophrenia, diabetes who presented from jail for drunk and disorderly conduct and presented to [facility] emergency department on 11/25.  In jail he was found to be obtunded and tachypneic and was therefore brought to the ED. given his history of ethylene glycol toxicity, multiple suicide attempts, and labs consistent with metabolic acidosis and acute kidney injury, he was treated empirically as ethylene glycol toxicity.  He was intubated in the [facility] on admission and is now 17M ICU.  Over the course of his hospital stay he was passing his SBT's, but was unable to be extubated for mental status.  His urine and serum testing for ethylene glycol was a send out lab at [facility], and was negative.  His urine was positive only for ketones.  Fomepizole was stopped after 48 hours, and once his acid-base disorder had resolved. Imaging + Acute L MCA/ACA stroke. Patient has remained persistently encephalopathic requiring mechanical ventilation.   Consults:  11/26: Nephrology, off 11/29: neurology  Micro Data:  11/25, Covid negative 11/25 BCx> no growth  11/26 sputum culture rare gram-positive cocci 11/26 MRSA PCR negative 11/26 tracheal aspirate> normal flora   Significant Diagnostic Tests:  11/25 CT H> no acute intracranial abnormality 11/28 CT H> L frontal hypodensity 11/29 MRI/A h/n>L hemispheric abnormal diffusion restriction. MRA without large vessel occlusion or stenosis however exam limited by motion degradation  11/29 ECHO> LVEF 60-65%, no septal defect 11/30 CT angio head/neck> Progressive patchy hypodensity L frontal lobe, high-grade R ICA stenosis, Stenosis vs subtotal occlusion of  non-dominant proximal R vertebral artery, no large vessel occlusion  12/1 CXR> Low lung volumes, no focal ASD  EEG 12/2>>> This study is suggestive of cortical dysfunction in left hemisphere likely secondary to underlying abnormality.  In addition there is evidence of moderate diffuse encephalopathy, nonspecific etiology. No seizures or epileptiform discharges were seen throughout the recording. eeg 12/4: L hemisphere sz  Antimicrobials:  S/p metronidazole x1 Received 48 hours empiric vancomycin and cefepime  Interim history/subjective:  12/4: pt with sz overnight on LTM. Started on phenytoin. This am he is more alert, attempting to move B/L toes. Tracking with eyes. Not yet nodding/shaking head or other commands.  12/3: Some hematuria. Remains on heparin gtt for DVT. Tol PS 5/5.   Objective   Blood pressure 123/76, pulse (!) 110, temperature 98.7 F (37.1 C), temperature source Axillary, resp. rate 20, height 6' (1.829 m), weight 103.5 kg, SpO2 98 %.    Vent Mode: PRVC FiO2 (%):  [30 %] 30 % Set Rate:  [16 bmp] 16 bmp Vt Set:  [600 mL] 600 mL PEEP:  [5 cmH20] 5 cmH20 Pressure Support:  [5 cmH20] 5 cmH20 Plateau Pressure:  [14 cmH20-20 cmH20] 20 cmH20   Intake/Output Summary (Last 24 hours) at 09/21/2019 1038 Last data filed at 09/21/2019 0900 Gross per 24 hour  Intake 2609.36 ml  Output 2900 ml  Net -290.64 ml   Filed Weights   09/19/19 0412 09/20/19 0410 09/21/19 0500  Weight: 106.1 kg 102.1 kg 103.5 kg    Examination: General:  Chronically ill appearing, NAD on vent  HENT:  NCAT ETT, mm moist  Lungs: resps even non labored on vent, clear bilaterally today Cardiac:  SR on telemetry, cool lower extremity, BUE Edema R>L Abdomen: obese, soft, non tender, nondistended Extremities: No deformities Neuro: awake tracking, attempting to move toes but otherwise not following commands.  RUE flaccid.   Assessment & Plan:   Acute ischemic stroke -L frontal infarct; multiple  punctate areas of restriction diffusion vs embolic infarcts -High-grade R ICA stenosis, severe stenosis vs subtotal occlusion of non-dominant proximal R vertebral artery  -Secondary stroke work up: LDL 69, hgbA1c: 81, TTE: No PFO.  + Lower extremity DVT  P: -Neurology following -ASA/Statin per Neuro.  Heparin drip for LE DVT  New onset tonic-clonic seizure -EEG without seizure; but with general and focal dysfunction-- L sided dysfunction and attenuation, R sided dysfunction, likely in setting of known stroke P -Neurology following, L hemisphere sz overnight 12/3 -started on phenytoin 12/4 -Keppra 1500 mg BID  -LTM continues  Acute hypoxemic respiratory failure, improving P: Vent support - 8cc/kg  F/u CXR  F/u ABG -retry PS today with more alert status.     Toxic metabolic encephalopathy -ethylene glycol neg at [facility] -acetone level mildly elevated  P - Thiamine - Delirium prevention  Hypertension -Home meds: Imdur 30 mg daily, lisinopril 10 mg daily, metoprolol 25 mg BID P: -resumed imdur 12/3 -cont increase metoprolol dosing for bp and hr control  AKI P -was improving but had a slight bump overnight -Continue supportive care and monitor I/O -uop increased yesterday (>2L output) -will add free water Hypernatremia:  -free water Hyperkalemia:  -resolved  DM2 -persistent hyperglycemia P -Resistant SSI + Scheduled insulin 7 units q 4 hours -increased levemir 35 units BID  Hx EtOH abuse P -thiamine, folate   LLE DVT P -IV heparin per pharmacy   Thrombocytopenia, mild -stable  P: -No acute intereventions  Schizoaffective disorder -Home meds: Hydroxyzine and Risperdone (newly prescribed)  MDD -h/o multiple suicide attempts P  -holding home meds in setting of encephalopathy.  Will need psych eval once neurological exam improved  LUE blister P -qD and PRN clean with warm soapy water, cover with thin layer of bacitracin and xeroform   Best practice:   Diet: TF Pain/Anxiety/Delirium protocol (if indicated): PRN fentanyl VAP protocol (if indicated): Yes DVT prophylaxis: heparin gtt GI prophylaxis: PPI Glucose control: SSI resistant, BID Levimir, EN coverage  Mobility: bed rest Code Status: full code Family Communication:  Will attempt to contact: Berenice Primas: Niece [phone] Disposition: ICU   Labs   CBC: Recent Labs  Lab 09/17/19 0312 09/18/19 0345 09/19/19 0428 09/20/19 0414 09/21/19 0421  WBC 5.6 7.5 7.6 8.9 8.2  HGB 10.3* 11.2* 10.7* 10.7* 10.3*  HCT 31.5* 33.5* 33.8* 33.7* 33.0*  MCV 93.5 93.1 96.6 97.1 97.3  PLT 104* 125* 137* 162 0000000    Basic Metabolic Panel: Recent Labs  Lab 09/14/19 2105 09/15/19 0536 09/15/19 1713  09/17/19 0312 09/17/19 1023 09/18/19 0345 09/19/19 0427 09/19/19 0950 09/20/19 0414 09/21/19 0421  NA  --  146*  --    < > 133*  --  141 142 145 144 146*  K  --  3.4*  --    < > 3.4*  --  4.9 5.2* 5.1 5.2* 5.0  CL  --  109  --    < > 103  --  109 108 111 112* 113*  CO2  --  21*  --    < > 23  --  24 24 24 23 24   GLUCOSE  --  332*  --    < > 234*  --  284* 285* 291* 302* 293*  BUN  --  62*  --    < > 41*  --  40* 38* 39* 46* 46*  CREATININE  --  1.55*  --    < > 1.05  --  1.10 1.18 1.25* 1.30* 1.42*  CALCIUM  --  8.6*  --    < > 7.2*  --  8.6* 8.7* 8.8* 9.0 9.1  MG 2.3 2.2 2.2  --   --  2.0 2.0  --   --   --   --   PHOS 2.6 1.9* 3.3   < > 2.3*  --  3.2 4.0  --  4.4 4.8*   < > = values in this interval not displayed.   GFR: Estimated Creatinine Clearance: 69.7 mL/min (A) (by C-G formula based on SCr of 1.42 mg/dL (H)). Recent Labs  Lab 09/18/19 0345 09/19/19 0428 09/20/19 0414 09/21/19 0421  WBC 7.5 7.6 8.9 8.2    Liver Function Tests: Recent Labs  Lab 09/17/19 0312 09/18/19 0345 09/19/19 0427 09/20/19 0414 09/21/19 0421  AST  --  28  --   --   --   ALT  --  38  --   --   --   ALKPHOS  --  55  --   --   --   BILITOT  --  0.4  --   --   --   PROT  --  5.1*  --   --    --   ALBUMIN 1.9* 2.3* 2.2* 2.3* 2.3*   No results for input(s): LIPASE, AMYLASE in the last 168 hours. No results for input(s): AMMONIA in the last 168 hours.  ABG    Component Value Date/Time   PHART 7.377 09/13/2019 0559   PCO2ART 32.5 09/13/2019 0559   PO2ART 118 (H) 09/13/2019 0559   HCO3 18.4 (L) 09/13/2019 0559   TCO2 19 (L) 05/30/2019 0535   ACIDBASEDEF 5.6 (H) 09/13/2019 0559   O2SAT 98.0 09/13/2019 0559     Coagulation Profile: No results for input(s): INR, PROTIME in the last 168 hours.  Cardiac Enzymes: No results for input(s): CKTOTAL, CKMB, CKMBINDEX, TROPONINI in the last 168 hours.  HbA1C: HB A1C (BAYER DCA - WAIVED)  Date/Time Value Ref Range Status  11/11/2016 11:32 AM 7.6 (H) <7.0 % Final    Comment:                                          Diabetic Adult            <7.0                                       Healthy Adult        4.3 - 5.7                                                           (DCCT/NGSP) American Diabetes Association's Summary of Glycemic Recommendations for Adults with Diabetes: Hemoglobin A1c <7.0%. More stringent glycemic goals (A1c <6.0%) may further reduce complications at the cost of increased risk of hypoglycemia.  Hgb A1c MFr Bld  Date/Time Value Ref Range Status  09/13/2019 05:45 AM 8.1 (H) 4.8 - 5.6 % Final    Comment:    (NOTE) Pre diabetes:          5.7%-6.4% Diabetes:              >6.4% Glycemic control for   <7.0% adults with diabetes   05/03/2019 09:40 AM 8.8 (H) 4.8 - 5.6 % Final    Comment:    (NOTE) Pre diabetes:          5.7%-6.4% Diabetes:              >6.4% Glycemic control for   <7.0% adults with diabetes     CBG: Recent Labs  Lab 09/20/19 1559 09/20/19 1941 09/20/19 2344 09/21/19 0414 09/21/19 0743  GLUCAP 289* 324* 292* 278* 250*    Critical care time: The patient is critically ill with multiple organ systems failure and requires high complexity decision making for assessment and  support, frequent evaluation and titration of therapies, application of advanced monitoring technologies and extensive interpretation of multiple databases.  Critical care time 39 mins. This represents my time independent of the NPs time taking care of the pt. This is excluding procedures.    [facility] 09/21/2019, 10:38 AM

## 2019-09-21 NOTE — Procedures (Signed)
Intubation Procedure Note [name] [id number] 11-05-1960  Procedure: Intubation Indications: ETT needed to be changed  Procedure Details Consent: Unable to obtain consent because of altered level of consciousness. Time Out: Verified patient identification, verified procedure, site/side was marked, verified correct patient position, special equipment/implants available, medications/allergies/relevent history reviewed, required imaging and test results available.  Performed  Maximum sterile technique was used including gloves and mask.  MAC and 4  Attempted tube exchange. Premedicated with midazolam 41m, Fentanyl 1058m.  Paralytic rocuronium 100 mg. Resistance encountered when passing ETT.  No color change. Attempted MAC 4 failed dt secretions. Glidescope 4 - Small airway but grade 1 view.  Tube placed at 22cm Decayed tooth chipped but fragment removed from mouth.   Evaluation Hemodynamic Status: BP stable throughout; O2 sats: transiently fell during during procedure Patient's Current Condition: stable Complications: No apparent complications Patient did tolerate procedure well. Chest X-ray ordered to verify placement.  CXR: pending.   RaEinar Gradgarwala 09/21/2019

## 2019-09-21 NOTE — Plan of Care (Signed)

## 2019-09-21 NOTE — Progress Notes (Signed)
STROKE TEAM PROGRESS NOTE   INTERVAL HISTORY Patient continues to improve.  Is sitting up in bed.  Remains aphasic but can follow occasional midline commands and squeezing hands.  Remains weak on the right.  EEG yesterday showed significant improvement with no ongoing seizure activity.  No witnessed clinical seizure activities.  Phenytoin level this morning is 7.5 and dose was increased to 125 mg every 8.  OBJECTIVE Vitals:   09/21/19 0700 09/21/19 0712 09/21/19 0736 09/21/19 0800  BP: (!) 175/91 (!) 175/91 (!) 145/56 138/86  Pulse: (!) 117 (!) 114 (!) 110 (!) 106  Resp: (!) 28 19 (!) 21 (!) 21  Temp:      TempSrc:      SpO2: 97% 97% 97% 98%  Weight:      Height:        CBC:  Recent Labs  Lab 09/20/19 0414 09/21/19 0421  WBC 8.9 8.2  HGB 10.7* 10.3*  HCT 33.7* 33.0*  MCV 97.1 97.3  PLT 162 0000000    Basic Metabolic Panel:  Recent Labs  Lab 09/17/19 1023 09/18/19 0345  09/20/19 0414 09/21/19 0421  NA  --  141   < > 144 146*  K  --  4.9   < > 5.2* 5.0  CL  --  109   < > 112* 113*  CO2  --  24   < > 23 24  GLUCOSE  --  284*   < > 302* 293*  BUN  --  40*   < > 46* 46*  CREATININE  --  1.10   < > 1.30* 1.42*  CALCIUM  --  8.6*   < > 9.0 9.1  MG 2.0 2.0  --   --   --   PHOS  --  3.2   < > 4.4 4.8*   < > = values in this interval not displayed.    Lipid Panel:     Component Value Date/Time   CHOL 122 09/17/2019 0312   CHOL 184 11/11/2016 1007   TRIG 158 (H) 09/17/2019 0312   HDL 21 (L) 09/17/2019 0312   HDL 25 (L) 11/11/2016 1007   CHOLHDL 5.8 09/17/2019 0312   VLDL 32 09/17/2019 0312   LDLCALC 69 09/17/2019 0312   LDLCALC 118 (H) 11/11/2016 1007   HgbA1c:  Lab Results  Component Value Date   HGBA1C 8.1 (H) 09/13/2019   Urine Drug Screen:     Component Value Date/Time   LABOPIA NONE DETECTED 09/12/2019 1326   COCAINSCRNUR NONE DETECTED 09/12/2019 1326   COCAINSCRNUR NONE DETECTED 12/09/2015 2242   LABBENZ NONE DETECTED 09/12/2019 1326   AMPHETMU NONE  DETECTED 09/12/2019 1326   THCU NONE DETECTED 09/12/2019 1326   LABBARB NONE DETECTED 09/12/2019 1326    Alcohol Level     Component Value Date/Time   ETH <10 09/12/2019 1326    IMAGING  Ct Angio Head W Or Wo Contrast Ct Angio Neck W Or Wo Contrast 09/17/2019 1. Negative for large vessel occlusion. Diminutive appearance of the intracranial circulation but no circle-of-Willis branch occlusion or significant intracranial stenosis identified. 2. Positive for severe stenosis vs. subtotal occlusion of the non-dominant proximal Right Vertebral Artery. This right vertebral terminates in PICA. 3. Positive also for high-grade Right ICA origin stenosis due to bulky calcified plaque, although the degree of this stenosis is numerically underestimated due to diminutive downstream right ICA (3 mm diameter). 4. The left ICA is similarly diminutive, but with no superimposed stenosis despite atherosclerosis.  5. Progressive patchy hypodensity in the left frontal lobe since 09/15/2019 suggests ischemia rather than postictal changes on the MRI yesterday. No associated hemorrhage or mass effect. 6. No new intracranial abnormality.   Dg Chest Port 1 View 09/21/2019 1. Slight increase in a diffuse interstitial pattern compatible with edema. 2. Mild bibasilar airspace disease likely reflects atelectasis. Infection is considered less likely. 3. The support apparatus is stable. 09/18/2019 1. No acute cardiopulmonary disease or significant interval change. 2. Stable support apparatus. 3. Low lung volumes.    Ct Head Wo Contrast 09/16/2019 IMPRESSION:  1. No acute intracranial hemorrhage.  2. Ill-defined areas of hypodensity in the left frontal convexity and left basal ganglia, new since the prior CT and concerning for acute infarct. Further evaluation with MRI is recommended.   Mr Brain 64 Contrast Mr Angio Head Wo Contrast Mr Angio Neck Wo Contrast 09/16/2019 IMPRESSION:  1. Large amount of abnormal diffusion  restriction, likely postictal, within the left hemisphere, predominantly cortical/subcortical.  2. Motion degraded MRA of the head and neck. No emergent large vessel occlusion or high-grade stenosis.   Vas [place] Lower Extremity Venous (dvt) 09/16/2019 Summary:  Right: There is no evidence of deep vein thrombosis in the lower extremity. No cystic structure found in the popliteal fossa.  Left: Findings consistent with acute deep vein thrombosis involving the left peroneal veins, left common femoral vein, left proximal profunda vein, and left popliteal vein. No cystic structure found in the popliteal fossa. Preliminary    Transthoracic Echocardiogram   1. Left ventricular ejection fraction, by visual estimation, is 60 to 65%. The left ventricle has normal function. Left ventricular septal wall thickness was mildly increased. Mildly increased left ventricular posterior wall thickness. There is mildly  increased left ventricular hypertrophy.  2. Global right ventricle has normal systolic function.The right ventricular size is normal. No increase in right ventricular wall thickness.  3. Left atrial size was normal.  4. Right atrial size was normal.  5. The mitral valve is normal in structure. Trace mitral valve regurgitation. No evidence of mitral stenosis.  6. The tricuspid valve is normal in structure. Tricuspid valve regurgitation is trivial.  7. The aortic valve is normal in structure. Aortic valve regurgitation is not visualized. No evidence of aortic valve sclerosis or stenosis.  8. The pulmonic valve was normal in structure. Pulmonic valve regurgitation is not visualized.  9. Normal pulmonary artery systolic pressure. 10. The inferior vena cava is normal in size with greater than 50% respiratory variability, suggesting right atrial pressure of 3 mmHg.  ECG - ST rate 122 BPM. (See cardiology reading for complete details)  EEG - This EEG recorded evidence of a generalized nonspecific cerebral  dysfunction (encephalopathy) in addition to focal dysfunction.  Though the prominence of slow activity over the right hemisphere would be suggestive of a focal right sided dysfunction, my suspicion is that this represents a generalized dysfunction with attenuation of the frequencies on the left due to his known stroke.  There is also evidence of left-sided dysfunction in the attenuation of the posterior dominant rhythm on that side. There was no seizure or seizure predisposition recorded on this study.   Repeat LT EEG No definite seizure activity.  Focal left sided slowing.  Mild bihemispheric slowing.  PHYSICAL EXAM  Pleasant middle-aged Caucasian male not in distress  Blood pressure 138/86, pulse (!) 106, temperature 98.8 F (37.1 C), temperature source Oral, resp. rate (!) 21, height 6' (1.829 m), weight 103.5 kg, SpO2 98 %.  General obese middle-aged male Ophthalmologic - fundi not visualized due to noncooperation.  Cardiovascular - Regular rhythm no murmur or gallop  Neuro -patient is awake alert aphasic,notfollowing commands. With eye opening, eyes in left gaze preferenceposition, not cross midlin,blinking to visual threat on the left but not on the right, doll's eyespresent, trackingon the left, PERRL. . Facial symmetry not able to test due to ET tube. Tongue protrusion not cooperative. On pain stimulation, no movement of RUE and slight withdraw on the BLE. LUE able to hold against gravity briefly on left bicep.DTR 1+ and bilateral positivebabinski. Sensation, coordination and gait not tested.  ASSESSMENT/PLAN [name] is a 58 y.o. 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  MRI head -  Large amount of abnormal diffusion restriction within the left hemisphere, predominantly cortical/subcortical.  MRA H&N - Motion degraded MRA of the head and neck. No emergent large vessel occlusion or high-grade stenosis.    CTA H&N High grade R ICA origin stenosis. Small L ICA.  LE Venous Dopplers - LLE acute DVT  TCD with bubble study - no PFO  2D Echo EF 60-65%  Hold off other embolic stroke work up for now given on anticoagulation for DVT  [employer] Virus 2 - negative  LDL - 69  HgbA1c - 8.1  UDS - negative  VTE prophylaxis - Heparin IV   aspirin 81 mg daily prior to admission, now on aspirin 325 and heparin IV per stroke protocol   Therapy recommendations:  pending  Disposition:  Pending  Seizure   11/28 With GTC and left gaze, lasting 30 sec  12/1 with eye flattering and upper left gaze, resolved after 4mg  versed  EEG no seizure, generalized and localized encephalopathy  On keppra bid and Ativan PRN  Recurrent seizure-like activity around 7p 12/1, 4a 12/2, 7a 12/3  Keppra now at 1500mg  bid  repeat EEG 12/2 encephalopathy but no seizure   Now on LTM EEG - neg so far we will discontinue  LE DVT  LE Venous Dopplers - LLE acute DVT  Unclear etiology   On heparin drip per stroke protocol    CBC monitoring  Acute Hypoxemic Respiratory failure   Intubated off sedation  CCM on board  Not able to extubate  Encephalopathy due to metabolic acidosis, AKI, hypernatremia  Creatinine 3.81-1.55-1.31-1.05-1.10-1.18-1.25-1.30-1.42   Sodium [id number]   Off 1/2NS and free  water, on LR  Nephrology on board  Hypertension  Home BP meds: Zestril ; Toprol-XL  Current BP meds: metoprolol 25 bid, imdur 30    Stable fluctuating . Long-term BP goal normotensive  Hyperlipidemia  Home Lipid lowering medication: Lipitor 40 mg daily  LDL - 69, at goal < 70  Current lipid lowering medication: Lipitor 40 mg daily   Continue statin at discharge  Diabetes type II, uncontrolled  Home diabetic meds: Insulin; metformin  Current diabetic meds: levemir  HgbA1c 8.1,  goal < 7.0  SSI  CBG monitoring  On levimir  Other Stroke Risk Factors  Cigarette smoker - will advise to stop smoking  ETOH use, advised to drink no more than 1 alcoholic beverage per day.  Hx substance abuse  Obesity, Body mass index is 30.95 kg/m., recommend weight loss, diet and exercise as appropriate   Coronary artery disease  Substance Abuse hx - cocaine ; benzodiazepines    Medical non compliance  Other Active Problems  Acute hypoxemic respiratory failure on vent.  Acute toxic metabolic encephalopathy  Schizoaffective d/o  MDD  Hx suicide ateempts  Normocytic anemia  Thrombocytopenia   Hyperkalemia 5.2 after supplement  - 5.0  LUE blister  Hematuria Hb 10.3, monitor given need for IV heparin for acute DVT  Hospital day # 9  Plan discontinue long-term EEG.  Continue Keppra and phenytoin the current dosages.  No need to repeat daily phenytoin as it will not really reach equilibrium status for another few days.  We will check another level on Thursday.  Continue therapy mobilize out of bed.  Discussed with pharmacist and Dr. Hortense Ramal. This patient is critically ill due to left brain stroke, seizure, AKI, encephalopathy, respiratory failure and at significant risk of neurological worsening, death form recurrent stroke, cerebral edema, brain herniation, status epilepticus. This patient's care requires constant monitoring of vital signs, hemodynamics, respiratory and  cardiac monitoring, review of multiple databases, neurological assessment, discussion with family, other specialists and medical decision making of high complexity. I spent 30 minutes of neurocritical care time in the care of this patient. I discussed with Dr. Ruthann Cancer from [place].   Antony Contras, MD Stroke Neurology 09/21/2019 8:29 AM  To contact Stroke Continuity provider, please refer to [email or web address] After hours, contact General Neurology

## 2019-09-21 NOTE — Procedures (Addendum)
Patient Name: [name]  MRN: [id number]  Epilepsy Attending: Lora Havens  Referring Physician/Provider: Dr. Rosalin Hawking Duration:  09/20/2019 1239 to 09/21/2019 1239  Patient history: 58 year old male who presented with right-sided weakness, left gaze deviation and seizure.  EEG evaluate for seizures.  Level of alertness: Awake, asleep  AEDs during EEG study: Keppra  Technical aspects: This EEG study was done with scalp electrodes positioned according to the 10-20 International system of electrode placement. Electrical activity was acquired at a sampling rate of 500Hz  and reviewed with a high frequency filter of 70Hz  and a low frequency filter of 1Hz . EEG data were recorded continuously and digitally stored.   Description: During awake state, no clear posterior dominant rhythm was seen. Sleep was characterized by sleep spindles (12-14Hz ), maximal frontocentral. EEG showed continuous generalized and lateralized left hemisphere 3 to 5 Hz theta-delta slowing.  Hyperventilation photic stimulation not performed.  Event button was pressed on 09/20/2019 at 2237. On video, patient was noted to have forced right gaze deviation.  Concomitant EEG before during and after the event showed rhythmic delta 2 to 3 Hz delta slowing with overriding 13 to 15 Hz beta activity in left hemisphere which then evolved to involve right hemisphere as well.  Multiple seizures with no clinical signs were noted with similar EEG findings as described earlier.  Abnormality -Seizures, left hemisphere -Continuous slow, generalized and lateralized left hemisphere  IMPRESSION: This study showed frequent seizures arising from left hemisphere.  During one of the seizures when event button was pressed on 09/20/2019 at 2237, patient was noted to have forced right gaze deviation.  However other seizures were subclinical/ without any definite clinical manifestations.  Additionally, there was evidence of cortical dysfunction  in left hemisphere likely secondary to underlying abnormality as well as moderate diffuse encephalopathy, nonspecific etiology.    Kimara Bencomo Barbra Sarks

## 2019-09-21 NOTE — Progress Notes (Signed)
 Nutrition Follow-up  DOCUMENTATION CODES:   Not applicable  INTERVENTION:   Tube Feeding:  Change to Vital AF 1.2 at 65 ml/hr Pro-Stat 30 mL TID Provides 2172 kcals, 162 g of protein and 1264 mL of free water Meets 100% estimated protein and calorie needs  Free water flushes per MD to manage hypernatremia  Continue MVI with Minerals   NUTRITION DIAGNOSIS:   Inadequate oral intake related to acute illness as evidenced by NPO status.  Being addressed via TF   GOAL:   Patient will meet greater than or equal to 90% of their needs  Progressing  MONITOR:   Vent status, Labs, Weight trends, TF tolerance, Skin  REASON FOR ASSESSMENT:   Consult, Ventilator Enteral/tube feeding initiation and management  ASSESSMENT:   58 yo male admitted with AMS and acute respiratory failure, possible toxic ingestion of ethylene glycol. PMH includes EtOH abuse, schizophrenia, DM, HTN, CAD, COPD  RD working remotely.  Pt remains on vent support, pt with multiple seizures over night, awake not following commands MV:12.5 L/min Temp (24hrs), Avg:98.8 F (37.1 C), Min:98.4 F (36.9 C), Max:99.3 F (37.4 C)  Vital 1.5 at 55 ml/hr, Pro-stat 30 mL QID, free water 100 mL q 2 hours ordered today for hypernatremia  Current wt 103.5 kg; admit weight 92.7 kg. Net + 6.5 L since admission  Labs: sodium 146 (H), Creatinine 1.43, CBGs [id number] Meds: folic acid, ss novolog, novolog q 4 hours, levemir,MVI with minerals, B-6, thiamine  Diet Order:   Diet Order            Diet NPO time specified  Diet effective now              EDUCATION NEEDS:   Not appropriate for education at this time  Skin:  Skin Assessment: Skin Integrity Issues: Skin Integrity Issues:: Stage I Stage I: buttocks  Last BM:  12/1  Height:   Ht Readings from Last 1 Encounters:  09/16/19 6' (1.829 m)    Weight:   Wt Readings from Last 1 Encounters:  09/21/19 103.5 kg    Ideal Body Weight:     BMI:   Body mass index is 30.95 kg/m.  Estimated Nutritional Needs:   Kcal:  2133 kcals  Protein:  140-185 g  Fluid:  >/= 2 L     Gentle Hoge MS, RDN, LDN, CNSC [phone] Pager  [phone] Weekend/On-Call Pager

## 2019-09-21 NOTE — Plan of Care (Signed)
  Problem: Education: Goal: Knowledge of disease or condition will improve Outcome: Not Progressing Goal: Knowledge of secondary prevention will improve Outcome: Not Progressing Goal: Knowledge of patient specific risk factors addressed and post discharge goals established will improve Outcome: Not Progressing Goal: Individualized Educational Video(s) Outcome: Not Progressing   

## 2019-09-21 NOTE — Progress Notes (Signed)
LTM maint complete - no skin breakdown under: O1,A1,P3,FP1,F7

## 2019-09-22 DIAGNOSIS — G92 Toxic encephalopathy: Secondary | ICD-10-CM

## 2019-09-22 DIAGNOSIS — G928 Other toxic encephalopathy: Secondary | ICD-10-CM

## 2019-09-22 LAB — CBC WITH DIFFERENTIAL/PLATELET
Abs Immature Granulocytes: 0.2 10*3/uL — ABNORMAL HIGH (ref 0.00–0.07)
Basophils Absolute: 0 10*3/uL (ref 0.0–0.1)
Basophils Relative: 0 %
Eosinophils Absolute: 0.2 10*3/uL (ref 0.0–0.5)
Eosinophils Relative: 2 %
HCT: 30.9 % — ABNORMAL LOW (ref 39.0–52.0)
Hemoglobin: 9.7 g/dL — ABNORMAL LOW (ref 13.0–17.0)
Immature Granulocytes: 2 %
Lymphocytes Relative: 12 %
Lymphs Abs: 1.1 10*3/uL (ref 0.7–4.0)
MCH: 30.8 pg (ref 26.0–34.0)
MCHC: 31.4 g/dL (ref 30.0–36.0)
MCV: 98.1 fL (ref 80.0–100.0)
Monocytes Absolute: 0.8 10*3/uL (ref 0.1–1.0)
Monocytes Relative: 9 %
Neutro Abs: 7.3 10*3/uL (ref 1.7–7.7)
Neutrophils Relative %: 75 %
Platelets: 209 10*3/uL (ref 150–400)
RBC: 3.15 MIL/uL — ABNORMAL LOW (ref 4.22–5.81)
RDW: 14.1 % (ref 11.5–15.5)
WBC: 9.7 10*3/uL (ref 4.0–10.5)
nRBC: 0 % (ref 0.0–0.2)

## 2019-09-22 LAB — PHENYTOIN LEVEL, TOTAL: Phenytoin Lvl: 11.2 ug/mL (ref 10.0–20.0)

## 2019-09-22 LAB — RENAL FUNCTION PANEL
Albumin: 2.2 g/dL — ABNORMAL LOW (ref 3.5–5.0)
Anion gap: 7 (ref 5–15)
BUN: 48 mg/dL — ABNORMAL HIGH (ref 6–20)
CO2: 25 mmol/L (ref 22–32)
Calcium: 9.1 mg/dL (ref 8.9–10.3)
Chloride: 114 mmol/L — ABNORMAL HIGH (ref 98–111)
Creatinine, Ser: 1.39 mg/dL — ABNORMAL HIGH (ref 0.61–1.24)
GFR calc Af Amer: >60 mL/min (ref >60)
GFR calc non Af Amer: 55 mL/min — ABNORMAL LOW (ref >60)
Glucose, Bld: 195 mg/dL — ABNORMAL HIGH (ref 70–99)
Phosphorus: 5.2 mg/dL — ABNORMAL HIGH (ref 2.5–4.6)
Potassium: 4.8 mmol/L (ref 3.5–5.1)
Sodium: 146 mmol/L — ABNORMAL HIGH (ref 135–145)

## 2019-09-22 LAB — GLUCOSE, CAPILLARY
Glucose-Capillary: 157 mg/dL — ABNORMAL HIGH (ref 70–99)
Glucose-Capillary: 177 mg/dL — ABNORMAL HIGH (ref 70–99)
Glucose-Capillary: 180 mg/dL — ABNORMAL HIGH (ref 70–99)
Glucose-Capillary: 188 mg/dL — ABNORMAL HIGH (ref 70–99)
Glucose-Capillary: 191 mg/dL — ABNORMAL HIGH (ref 70–99)
Glucose-Capillary: 205 mg/dL — ABNORMAL HIGH (ref 70–99)
Glucose-Capillary: 210 mg/dL — ABNORMAL HIGH (ref 70–99)

## 2019-09-22 LAB — HEPARIN LEVEL (UNFRACTIONATED): Heparin Unfractionated: 0.32 IU/mL (ref 0.30–0.70)

## 2019-09-22 MED ORDER — SODIUM CHLORIDE 0.9 % IV SOLN
INTRAVENOUS | Status: DC
  Administered 2019-09-22 – 2019-09-23 (×2): via INTRAVENOUS

## 2019-09-22 NOTE — Progress Notes (Signed)
EEG maintenance done. No skin breakdown on FP1 FP2 F8 continue to monitor

## 2019-09-22 NOTE — Progress Notes (Signed)
NAME:  [name], MRN:  [id number], DOB:  January 28, 1961, LOS: 10 ADMISSION DATE:  09/12/2019, CONSULTATION DATE: 11/25 REFERRING MD:  Audria Nine, DO CHIEF COMPLAINT: Toxic ingestion, altered mental status  Brief History   [name] is a 58 year old gentleman with a history of alcohol abuse disorder, schizophrenia, diabetes who presented from jail for drunk and disorderly conduct and presented to [facility] emergency department on 11/25.  In jail he was found to be obtunded and tachypneic and was therefore brought to the ED. given his history of ethylene glycol toxicity, multiple suicide attempts, and labs consistent with metabolic acidosis and acute kidney injury, he was treated empirically as ethylene glycol toxicity.  He was intubated in the [facility] on admission and is now 63M ICU.  Over the course of his hospital stay he was passing his SBT's, but was unable to be extubated for mental status.  His urine and serum testing for ethylene glycol was a send out lab at [facility], and was negative.  His urine was positive only for ketones.  Fomepizole was stopped after 48 hours, and once his acid-base disorder had resolved. Imaging + Acute L MCA/ACA stroke. Patient has remained persistently encephalopathic requiring mechanical ventilation.   Consults:  11/26: Nephrology, off 11/29: neurology  Micro Data:  11/25, Covid negative 11/25 BCx> no growth  11/26 sputum culture rare gram-positive cocci 11/26 MRSA PCR negative 11/26 tracheal aspirate> normal flora   Significant Diagnostic Tests:  11/25 CT H> no acute intracranial abnormality 11/28 CT H> L frontal hypodensity 11/29 MRI/A h/n>L hemispheric abnormal diffusion restriction. MRA without large vessel occlusion or stenosis however exam limited by motion degradation  11/29 ECHO> LVEF 60-65%, no septal defect 11/30 CT angio head/neck> Progressive patchy hypodensity L frontal lobe, high-grade R ICA stenosis, Stenosis vs subtotal occlusion of  non-dominant proximal R vertebral artery, no large vessel occlusion  12/1 CXR> Low lung volumes, no focal ASD  EEG 12/2>>> This study is suggestive of cortical dysfunction in left hemisphere likely secondary to underlying abnormality.  In addition there is evidence of moderate diffuse encephalopathy, nonspecific etiology. No seizures or epileptiform discharges were seen throughout the recording. eeg 12/4: L hemisphere sz  Antimicrobials:  S/p metronidazole x1 Received 48 hours empiric vancomycin and cefepime  Interim history/subjective:  12/4: pt with sz overnight on LTM. Started on phenytoin. This am he is more alert, attempting to move B/L toes. Tracking with eyes. Not yet nodding/shaking head or other commands.  12/3: Some hematuria. Remains on heparin gtt for DVT. Tol PS 5/5.   Objective   Blood pressure 136/85, pulse (!) 101, temperature 99.8 F (37.7 C), temperature source Oral, resp. rate (!) 24, height 6' (1.829 m), weight 105.3 kg, SpO2 97 %.    Vent Mode: PRVC FiO2 (%):  [30 %-50 %] 40 % Set Rate:  [16 bmp] 16 bmp Vt Set:  [600 mL] 600 mL PEEP:  [5 cmH20] 5 cmH20 Plateau Pressure:  [15 cmH20-17 cmH20] 15 cmH20   Intake/Output Summary (Last 24 hours) at 09/22/2019 0837 Last data filed at 09/22/2019 0800 Gross per 24 hour  Intake 3036.69 ml  Output 2375 ml  Net 661.69 ml   Filed Weights   09/20/19 0410 09/21/19 0500 09/22/19 0452  Weight: 102.1 kg 103.5 kg 105.3 kg    Examination: General:  Chronically ill appearing, NAD on vent  HENT:  NCAT ETT, mm moist  Lungs: resps even non labored on vent, clear bilaterally today x/ right base Cardiac: RRR, SR  on telemetry  Abdomen: obese, soft, non tender, nondistended Extremities: No deformities, BUE Edema R>L. Boots on LEs, dressing on right heel pressure sore removed to expose ~2cm healing ~stage 1 pressure sore, with good granulation, scab.  Neuro: CN III-XII grossly intact, tongue midline, facial expression equal. Will move  L extremeties/squeeze on request most of the time.  RUE flaccid, no w/d to stim.    Assessment & Plan:   Acute ischemic stroke -L frontal infarct; multiple punctate areas of restriction diffusion vs embolic infarcts -High-grade R ICA stenosis, severe stenosis vs subtotal occlusion of non-dominant proximal R vertebral artery  -Secondary stroke work up: LDL 69, hgbA1c: 81, TTE: No PFO.  + Lower extremity DVT  P: -Neurology following, continuous EEG still ongoing -ASA/Statin per Neuro.  Heparin drip for LE DVT  New onset tonic-clonic seizure -EEG without seizure; but with general and focal dysfunction-- L sided dysfunction and attenuation, R sided dysfunction, likely in setting of known stroke P -Neurology following, L hemisphere sz overnight 12/3 -started on phenytoin 12/4; level normalizing -Keppra 1500 mg BID  -LTM continues  Acute hypoxemic respiratory failure, improving P: Vent support - 8cc/kg  F/u CXR--trach aspirate today with what appears to be developing pneumonia.   F/u ABG -will continue SBT/PS today with more alert status; i.     Toxic metabolic encephalopathy -ethylene glycol neg at [facility] -acetone level mildly elevated  P - Thiamine - Delirium prevention  Hypertension -Home meds: Imdur 30 mg daily, lisinopril 10 mg daily, metoprolol 25 mg BID P: -resumed imdur 12/3 -cont increase metoprolol dosing for bp and hr control  AKI P -was improving but had a slight bump overnight -Continue supportive care and monitor I/O -uop increased yesterday (>2L output) -will add free water Hypernatremia:  -free water Hyperkalemia:  -resolved  DM2 -persistent hyperglycemia P -Resistant SSI + Scheduled insulin 7 units q 4 hours -increased levemir 35 units BID; 1st dose at higher level at 10 p.m. 12/4; will follow today and increase if needed  Hx EtOH abuse P -thiamine, folate   LLE DVT P -IV heparin per pharmacy   Thrombocytopenia, mild -stable  P: -No acute  intereventions  Schizoaffective disorder -Home meds: Hydroxyzine and Risperdone (newly prescribed)  MDD -h/o multiple suicide attempts P  -holding home meds in setting of encephalopathy.  Will need psych eval once neurological exam improved  LUE blister P -qD and PRN clean with warm soapy water, cover with thin layer of bacitracin and xeroform   LLE 2 cm pressure sore healing, sponge/gauze  Best practice:  Diet: TF Pain/Anxiety/Delirium protocol (if indicated): PRN fentanyl VAP protocol (if indicated): Yes DVT prophylaxis: heparin gtt GI prophylaxis: PPI Glucose control: SSI resistant, BID Levimir, EN coverage  Mobility: bed rest Code Status: full code Family Communication:  Will attempt to contact: Berenice Primas: Niece [phone] Disposition: ICU   Labs   CBC: Recent Labs  Lab 09/17/19 0312 09/18/19 0345 09/19/19 0428 09/20/19 0414 09/21/19 0421  WBC 5.6 7.5 7.6 8.9 8.2  HGB 10.3* 11.2* 10.7* 10.7* 10.3*  HCT 31.5* 33.5* 33.8* 33.7* 33.0*  MCV 93.5 93.1 96.6 97.1 97.3  PLT 104* 125* 137* 162 0000000    Basic Metabolic Panel: Recent Labs  Lab 09/15/19 1713  09/17/19 1023 09/18/19 0345 09/19/19 0427 09/19/19 0950 09/20/19 0414 09/21/19 0421 09/22/19 0441  NA  --    < >  --  141 142 145 144 146* 146*  K  --    < >  --  4.9  5.2* 5.1 5.2* 5.0 4.8  CL  --    < >  --  109 108 111 112* 113* 114*  CO2  --    < >  --  24 24 24 23 24 25   GLUCOSE  --    < >  --  284* 285* 291* 302* 293* 195*  BUN  --    < >  --  40* 38* 39* 46* 46* 48*  CREATININE  --    < >  --  1.10 1.18 1.25* 1.30* 1.42* 1.39*  CALCIUM  --    < >  --  8.6* 8.7* 8.8* 9.0 9.1 9.1  MG 2.2  --  2.0 2.0  --   --   --   --   --   PHOS 3.3   < >  --  3.2 4.0  --  4.4 4.8* 5.2*   < > = values in this interval not displayed.   GFR: Estimated Creatinine Clearance: 71.8 mL/min (A) (by C-G formula based on SCr of 1.39 mg/dL (H)). Recent Labs  Lab 09/18/19 0345 09/19/19 0428 09/20/19 0414 09/21/19  0421  WBC 7.5 7.6 8.9 8.2    Liver Function Tests: Recent Labs  Lab 09/18/19 0345 09/19/19 0427 09/20/19 0414 09/21/19 0421 09/22/19 0441  AST 28  --   --   --   --   ALT 38  --   --   --   --   ALKPHOS 55  --   --   --   --   BILITOT 0.4  --   --   --   --   PROT 5.1*  --   --   --   --   ALBUMIN 2.3* 2.2* 2.3* 2.3* 2.2*   No results for input(s): LIPASE, AMYLASE in the last 168 hours. No results for input(s): AMMONIA in the last 168 hours.  ABG    Component Value Date/Time   PHART 7.377 09/13/2019 0559   PCO2ART 32.5 09/13/2019 0559   PO2ART 118 (H) 09/13/2019 0559   HCO3 18.4 (L) 09/13/2019 0559   TCO2 19 (L) 05/30/2019 0535   ACIDBASEDEF 5.6 (H) 09/13/2019 0559   O2SAT 98.0 09/13/2019 0559     Coagulation Profile: No results for input(s): INR, PROTIME in the last 168 hours.  Cardiac Enzymes: No results for input(s): CKTOTAL, CKMB, CKMBINDEX, TROPONINI in the last 168 hours.  HbA1C: HB A1C (BAYER DCA - WAIVED)  Date/Time Value Ref Range Status  11/11/2016 11:32 AM 7.6 (H) <7.0 % Final    Comment:                                          Diabetic Adult            <7.0                                       Healthy Adult        4.3 - 5.7                                                           (  DCCT/NGSP) American Diabetes Association's Summary of Glycemic Recommendations for Adults with Diabetes: Hemoglobin A1c <7.0%. More stringent glycemic goals (A1c <6.0%) may further reduce complications at the cost of increased risk of hypoglycemia.    Hgb A1c MFr Bld  Date/Time Value Ref Range Status  09/13/2019 05:45 AM 8.1 (H) 4.8 - 5.6 % Final    Comment:    (NOTE) Pre diabetes:          5.7%-6.4% Diabetes:              >6.4% Glycemic control for   <7.0% adults with diabetes   05/03/2019 09:40 AM 8.8 (H) 4.8 - 5.6 % Final    Comment:    (NOTE) Pre diabetes:          5.7%-6.4% Diabetes:              >6.4% Glycemic control for   <7.0% adults with  diabetes     CBG: Recent Labs  Lab 09/21/19 1600 09/21/19 1947 09/21/19 2358 09/22/19 0353 09/22/19 0741  GLUCAP 234* 241* 210* 177* 157*    Critical care time: The patient is critically ill with multiple organ systems failure and requires high complexity decision making for assessment and support, frequent evaluation and titration of therapies, application of advanced monitoring technologies and extensive interpretation of multiple databases.  Critical care time 42  mins. This represents my time independent of the NPs time taking care of the pt. This is excluding procedures.   Bonna Gains 09/22/2019, 8:46 AM

## 2019-09-22 NOTE — Progress Notes (Signed)
STROKE TEAM PROGRESS NOTE   INTERVAL HISTORY Patient improved today. Can follow commands intermittently such as squeeze hand, lifts arm with visual cues, moves foot when asked to wiggle toes. Significant improvement. EEG appears improved on brief inspection during examination.  OBJECTIVE Vitals:   09/22/19 0911 09/22/19 1000 09/22/19 1100 09/22/19 1120  BP: (!) 157/90 (!) 151/99 (!) 141/83   Pulse: 98 86 87   Resp:  (!) 22 (!) 22   Temp:   99.7 F (37.6 C)   TempSrc:   Oral   SpO2:  99% 99% 97%  Weight:      Height:        CBC:  Recent Labs  Lab 09/21/19 0421 09/22/19 0927  WBC 8.2 9.7  NEUTROABS  --  7.3  HGB 10.3* 9.7*  HCT 33.0* 30.9*  MCV 97.3 98.1  PLT 195 [id number]    Basic Metabolic Panel:  Recent Labs  Lab 09/17/19 1023 09/18/19 0345  09/21/19 0421 09/22/19 0441  NA  --  141   < > 146* 146*  K  --  4.9   < > 5.0 4.8  CL  --  109   < > 113* 114*  CO2  --  24   < > 24 25  GLUCOSE  --  284*   < > 293* 195*  BUN  --  40*   < > 46* 48*  CREATININE  --  1.10   < > 1.42* 1.39*  CALCIUM  --  8.6*   < > 9.1 9.1  MG 2.0 2.0  --   --   --   PHOS  --  3.2   < > 4.8* 5.2*   < > = values in this interval not displayed.    Lipid Panel:     Component Value Date/Time   CHOL 122 09/17/2019 0312   CHOL 184 11/11/2016 1007   TRIG 158 (H) 09/17/2019 0312   HDL 21 (L) 09/17/2019 0312   HDL 25 (L) 11/11/2016 1007   CHOLHDL 5.8 09/17/2019 0312   VLDL 32 09/17/2019 0312   LDLCALC 69 09/17/2019 0312   LDLCALC 118 (H) 11/11/2016 1007   HgbA1c:  Lab Results  Component Value Date   HGBA1C 8.1 (H) 09/13/2019   Urine Drug Screen:     Component Value Date/Time   LABOPIA NONE DETECTED 09/12/2019 1326   COCAINSCRNUR NONE DETECTED 09/12/2019 1326   COCAINSCRNUR NONE DETECTED 12/09/2015 2242   LABBENZ NONE DETECTED 09/12/2019 1326   AMPHETMU NONE DETECTED 09/12/2019 1326   THCU NONE DETECTED 09/12/2019 1326   LABBARB NONE DETECTED 09/12/2019 1326    Alcohol Level      Component Value Date/Time   ETH <10 09/12/2019 1326    IMAGING  Ct Angio Head W Or Wo Contrast Ct Angio Neck W Or Wo Contrast 09/17/2019 1. Negative for large vessel occlusion. Diminutive appearance of the intracranial circulation but no circle-of-Willis branch occlusion or significant intracranial stenosis identified. 2. Positive for severe stenosis vs. subtotal occlusion of the non-dominant proximal Right Vertebral Artery. This right vertebral terminates in PICA. 3. Positive also for high-grade Right ICA origin stenosis due to bulky calcified plaque, although the degree of this stenosis is numerically underestimated due to diminutive downstream right ICA (3 mm diameter). 4. The left ICA is similarly diminutive, but with no superimposed stenosis despite atherosclerosis. 5. Progressive patchy hypodensity in the left frontal lobe since 09/15/2019 suggests ischemia rather than postictal changes on the MRI yesterday. No associated hemorrhage  or mass effect. 6. No new intracranial abnormality.   Dg Chest Port 1 View 09/21/2019 Progressive consolidation RIGHT lower lobe suspicious for pneumonia. 09/18/2019 1. No acute cardiopulmonary disease or significant interval change. 2. Stable support apparatus. 3. Low lung volumes.    Ct Head Wo Contrast 09/16/2019 IMPRESSION:  1. No acute intracranial hemorrhage.  2. Ill-defined areas of hypodensity in the left frontal convexity and left basal ganglia, new since the prior CT and concerning for acute infarct. Further evaluation with MRI is recommended.   Mr Brain 37 Contrast Mr Angio Head Wo Contrast Mr Angio Neck Wo Contrast 09/16/2019 IMPRESSION:  1. Large amount of abnormal diffusion restriction, likely postictal, within the left hemisphere, predominantly cortical/subcortical.  2. Motion degraded MRA of the head and neck. No emergent large vessel occlusion or high-grade stenosis.   Vas [place] Lower Extremity Venous (dvt) 09/16/2019 Summary:  Right:  There is no evidence of deep vein thrombosis in the lower extremity. No cystic structure found in the popliteal fossa.  Left: Findings consistent with acute deep vein thrombosis involving the left peroneal veins, left common femoral vein, left proximal profunda vein, and left popliteal vein. No cystic structure found in the popliteal fossa. Preliminary    Transthoracic Echocardiogram   1. Left ventricular ejection fraction, by visual estimation, is 60 to 65%. The left ventricle has normal function. Left ventricular septal wall thickness was mildly increased. Mildly increased left ventricular posterior wall thickness. There is mildly  increased left ventricular hypertrophy.  2. Global right ventricle has normal systolic function.The right ventricular size is normal. No increase in right ventricular wall thickness.  3. Left atrial size was normal.  4. Right atrial size was normal.  5. The mitral valve is normal in structure. Trace mitral valve regurgitation. No evidence of mitral stenosis.  6. The tricuspid valve is normal in structure. Tricuspid valve regurgitation is trivial.  7. The aortic valve is normal in structure. Aortic valve regurgitation is not visualized. No evidence of aortic valve sclerosis or stenosis.  8. The pulmonic valve was normal in structure. Pulmonic valve regurgitation is not visualized.  9. Normal pulmonary artery systolic pressure. 10. The inferior vena cava is normal in size with greater than 50% respiratory variability, suggesting right atrial pressure of 3 mmHg.  ECG - ST rate 122 BPM. (See cardiology reading for complete details)  EEG - This EEG recorded evidence of a generalized nonspecific cerebral dysfunction (encephalopathy) in addition to focal dysfunction.  Though the prominence of slow activity over the right hemisphere would be suggestive of a focal right sided dysfunction, my suspicion is that this represents a generalized dysfunction with attenuation of the  frequencies on the left due to his known stroke.  There is also evidence of left-sided dysfunction in the attenuation of the posterior dominant rhythm on that side. There was no seizure or seizure predisposition recorded on this study.   Overnight EEG w Video 09/21/2019 IMPRESSION: This study showed frequent seizures arising from left hemisphere.  During one of the seizures when event button was pressed on 09/20/2019 at 2237, patient was noted to have forced right gaze deviation.  However other seizures were subclinical/ without any definite clinical manifestations.  Additionally, there was evidence of cortical dysfunction in left hemisphere likely secondary to underlying abnormality as well as moderate diffuse encephalopathy, nonspecific etiology.   PHYSICAL EXAM  Blood pressure (!) 141/83, pulse 87, temperature 99.7 F (37.6 C), temperature source Oral, resp. rate (!) 22, height 6' (1.829 m), weight 105.3  kg, SpO2 97 %.  General - Well nourished, well developed, intubated no sedation.  Ophthalmologic - fundi not visualized due to noncooperation.  Cardiovascular - Regular rhythm but tachycardia.  Neuro - intubatednot onsedation, eyes open on voice,Can follow commands intermittently such as squeeze hand, lifts arm with visual cues, moves foot when asked to wiggle toes. Significant improvement. With eye opening, eyes in left gaze preferenceposition, not cross midlin,blinking to visual threat on the left but not on the right, doll's eyespresent, trackingon the left, PERRL. Corneal reflexpositive on the left, absent on the right, gag and coughpresent. Breathing over the vent. Facial symmetry not able to test due to ET tube. Tongue protrusion not cooperative. On pain stimulation, no movement of RUE and slight withdraw on the BLE. LUE able to hold against gravity briefly on left bicep.DTR 1+ and bilateral positivebabinski. Sensation, coordination and gait not  tested.  ASSESSMENT/PLAN [name] is a 58 y.o. 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  MRI head -  Large amount of abnormal diffusion restriction within the left hemisphere, predominantly cortical/subcortical.  MRA H&N - Motion degraded MRA of the head and neck. No emergent large vessel occlusion or high-grade stenosis.    CTA H&N High grade R ICA origin stenosis. Small L ICA.  LE Venous Dopplers - LLE acute DVT  TCD with bubble study - no PFO  2D Echo EF 60-65%  Hold off other embolic stroke work up for now given on anticoagulation for DVT  [employer] Virus 2 - negative  LDL - 69  HgbA1c - 8.1  UDS - negative  VTE prophylaxis - Heparin IV   aspirin 81 mg daily prior to admission, now on aspirin 325 and heparin IV per stroke protocol   Therapy recommendations:  pending  Disposition:  Pending  Seizure   11/28 With GTC and left gaze, lasting 30 sec  12/1 with eye flattering and upper left gaze, resolved after 4mg  versed  EEG no seizure, generalized and localized encephalopathy  On keppra bid and Ativan PRN  Recurrent seizure-like activity around 7p 12/1, 4a 12/2, 7a 12/3  Pt was on Keppra 1500mg  bid -> changed to Dilantin with pharmacy dosing.  repeat EEG 12/2 encephalopathy but no seizure   Now on LTM EEG - 09/21/19 -  frequent seizures arising from left hemisphere.  One with forced right gaze deviation ; however, other seizures were subclinical / without any definite clinical manifestations.  Additionally, there was evidence of cortical dysfunction in left hemisphere likely secondary to underlying abnormality as well as moderate diffuse encephalopathy, nonspecific etiology. EEG improved today no seizures seen, following commands intermittently.  LE DVT  LE Venous Dopplers - LLE acute DVT  Unclear etiology   On heparin drip per stroke protocol    CBC monitoring  Acute Hypoxemic Respiratory failure   Intubated off sedation  CCM on board  Not able to extubate  Encephalopathy due to metabolic acidosis, AKI, hypernatremia  Creatinine 3.81-1.55-1.31-1.05-1.10-1.18-1.25-1.30->1.39  Sodium 152-148-146-133-141-142-145-144->146  Off 1/2NS and free water, on LR  Nephrology on board  Hypertension  Home BP meds: Zestril ; Toprol-XL  Current BP meds: metoprolol 12.5 bid     Stable fluctuating . Long-term BP goal normotensive  Hyperlipidemia  Home Lipid lowering medication: Lipitor 40 mg daily  LDL - 69, at goal < 70  Current lipid lowering medication: Lipitor 40 mg daily   Continue statin at discharge  Diabetes type II, uncontrolled  Home diabetic meds: Insulin; metformin  Current diabetic meds: levemir  HgbA1c 8.1, goal < 7.0  SSI  CBG monitoring  On levimir  Other Stroke Risk Factors  Cigarette smoker - will advise to stop smoking  ETOH use, advised to drink no more than 1 alcoholic beverage per day.  Hx substance abuse  Obesity, Body mass index is 31.48 kg/m., recommend weight loss, diet and exercise as appropriate   Coronary artery disease  Substance Abuse hx - cocaine ; benzodiazepines    Medical non compliance  Other Active Problems  Acute hypoxemic respiratory failure on vent.  Acute toxic metabolic encephalopathy  Schizoaffective d/o  MDD  Hx  suicide atempts  Normocytic anemia - Hb - 10.3->9.7  Thrombocytopenia - resolved - 209  Hyperkalemia 5.2 after supplement  Now 4.8  LUE blister  Hematuria Hb 10.7, monitor given need for IV heparin for acute DVT  Temp - 99.7 (WBCs - 9.7)  Phos - 4.8->5.2  Hospital day # 10  This patient is critically ill and at significant risk of neurological worsening, death and care requires constant monitoring of vital signs, hemodynamics,respiratory and cardiac monitoring,review of multiple databases, neurological assessment, discussion with family, other specialists and medical decision making of high complexity.I  I spent 30 minutes of neurocritical care time in the care of this patient.  Sarina Ill, MD     To contact Stroke Continuity provider, please refer to [email or web address] After hours, contact General Neurology

## 2019-09-22 NOTE — Procedures (Addendum)
Patient Name:[name] [id number] Epilepsy Attending:Edrees Valent Barbra Sarks Referring Physician/Provider:Dr. Areatha Keas Duration: 09/21/2019 1239 to 09/22/2019 1239  Patient history:58 year old male who presented with right-sided weakness, left gaze deviation and seizure. EEG evaluate for seizures.  Level of alertness:Awake, asleep  AEDs during EEG study:Keppra, PHT  Technical aspects: This EEG study was done with scalp electrodes positioned according to the 10-20 International system of electrode placement. Electrical activity was acquired at a sampling rate of 500Hz  and reviewed with a high frequency filter of 70Hz  and a low frequency filter of 1Hz . EEG data were recorded continuously and digitally stored.  Description: During awake state, no clear posterior dominant rhythm was seen. Sleep was characterized by sleep spindles (12-14Hz ), maximal frontocentral.EEG showed continuous generalized and lateralized left hemisphere 3 to 5 Hz theta-delta slowing. Hyperventilation photic stimulation not performed.  Abnormality -Continuous slow, generalized and lateralized left hemisphere  IMPRESSION: This study showed evidence of cortical dysfunction in left hemisphere likely secondary to underlying abnormality as well as moderate diffuse encephalopathy, nonspecific etiology. No definite seizures were seen.  EEG appears improved compared to yesterday.   [name] Barbra Sarks

## 2019-09-23 LAB — RENAL FUNCTION PANEL
Albumin: 2.1 g/dL — ABNORMAL LOW (ref 3.5–5.0)
Anion gap: 11 (ref 5–15)
BUN: 49 mg/dL — ABNORMAL HIGH (ref 6–20)
CO2: 24 mmol/L (ref 22–32)
Calcium: 9 mg/dL (ref 8.9–10.3)
Chloride: 113 mmol/L — ABNORMAL HIGH (ref 98–111)
Creatinine, Ser: 1.38 mg/dL — ABNORMAL HIGH (ref 0.61–1.24)
GFR calc Af Amer: >60 mL/min (ref >60)
GFR calc non Af Amer: 56 mL/min — ABNORMAL LOW (ref >60)
Glucose, Bld: 194 mg/dL — ABNORMAL HIGH (ref 70–99)
Phosphorus: 4.5 mg/dL (ref 2.5–4.6)
Potassium: 4.4 mmol/L (ref 3.5–5.1)
Sodium: 148 mmol/L — ABNORMAL HIGH (ref 135–145)

## 2019-09-23 LAB — GLUCOSE, CAPILLARY
Glucose-Capillary: 108 mg/dL — ABNORMAL HIGH (ref 70–99)
Glucose-Capillary: 120 mg/dL — ABNORMAL HIGH (ref 70–99)
Glucose-Capillary: 143 mg/dL — ABNORMAL HIGH (ref 70–99)
Glucose-Capillary: 144 mg/dL — ABNORMAL HIGH (ref 70–99)
Glucose-Capillary: 187 mg/dL — ABNORMAL HIGH (ref 70–99)
Glucose-Capillary: 69 mg/dL — ABNORMAL LOW (ref 70–99)
Glucose-Capillary: 78 mg/dL (ref 70–99)

## 2019-09-23 LAB — CBC WITH DIFFERENTIAL/PLATELET
Abs Immature Granulocytes: 0.13 10*3/uL — ABNORMAL HIGH (ref 0.00–0.07)
Basophils Absolute: 0 10*3/uL (ref 0.0–0.1)
Basophils Relative: 1 %
Eosinophils Absolute: 0.2 10*3/uL (ref 0.0–0.5)
Eosinophils Relative: 2 %
HCT: 29.7 % — ABNORMAL LOW (ref 39.0–52.0)
Hemoglobin: 9.2 g/dL — ABNORMAL LOW (ref 13.0–17.0)
Immature Granulocytes: 2 %
Lymphocytes Relative: 11 %
Lymphs Abs: 0.9 10*3/uL (ref 0.7–4.0)
MCH: 30.4 pg (ref 26.0–34.0)
MCHC: 31 g/dL (ref 30.0–36.0)
MCV: 98 fL (ref 80.0–100.0)
Monocytes Absolute: 0.6 10*3/uL (ref 0.1–1.0)
Monocytes Relative: 7 %
Neutro Abs: 7 10*3/uL (ref 1.7–7.7)
Neutrophils Relative %: 77 %
Platelets: 222 10*3/uL (ref 150–400)
RBC: 3.03 MIL/uL — ABNORMAL LOW (ref 4.22–5.81)
RDW: 14.1 % (ref 11.5–15.5)
WBC: 8.9 10*3/uL (ref 4.0–10.5)
nRBC: 0 % (ref 0.0–0.2)

## 2019-09-23 LAB — CULTURE, BAL-QUANTITATIVE W GRAM STAIN: Culture: >=100000 — AB

## 2019-09-23 LAB — HEPARIN LEVEL (UNFRACTIONATED): Heparin Unfractionated: 0.33 IU/mL (ref 0.30–0.70)

## 2019-09-23 LAB — PHENYTOIN LEVEL, TOTAL: Phenytoin Lvl: 8.1 ug/mL — ABNORMAL LOW (ref 10.0–20.0)

## 2019-09-23 MED ORDER — METOPROLOL TARTRATE 5 MG/5ML IV SOLN
2.5000 mg | INTRAVENOUS | Status: DC | PRN
  Administered 2019-09-27: 5 mg via INTRAVENOUS
  Filled 2019-09-23: qty 5

## 2019-09-23 MED ORDER — INSULIN DETEMIR 100 UNIT/ML ~~LOC~~ SOLN
40.0000 [IU] | Freq: Two times a day (BID) | SUBCUTANEOUS | Status: DC
  Administered 2019-09-25 – 2019-09-27 (×4): 40 [IU] via SUBCUTANEOUS
  Filled 2019-09-23 (×9): qty 0.4

## 2019-09-23 MED ORDER — SODIUM CHLORIDE 0.9 % IV SOLN
125.0000 mg | Freq: Three times a day (TID) | INTRAVENOUS | Status: DC
  Administered 2019-09-23 – 2019-09-27 (×12): 125 mg via INTRAVENOUS
  Filled 2019-09-23 (×17): qty 2.5

## 2019-09-23 MED ORDER — DEXTROSE 50 % IV SOLN: 12.5000 g | INTRAVENOUS | Status: AC

## 2019-09-23 MED ORDER — METOPROLOL TARTRATE 5 MG/5ML IV SOLN: 2.5000 mg | INTRAVENOUS | Status: DC | PRN

## 2019-09-23 NOTE — Progress Notes (Signed)
EEG maint complete. Checked frontal leads. Fixed O1 and checked f7. No skin breakdown. Continue to monitor

## 2019-09-23 NOTE — Progress Notes (Signed)
[facility] for Heparin Indication: DVT and stroke  Allergies  Allergen Reactions  . Quetiapine Anaphylaxis  . Seroquel [Quetiapine Fumarate] Anxiety and Other (See Comments)    Reaction:  Nightmares   . Trazodone Anaphylaxis  . Trazodone And Nefazodone Anxiety and Other (See Comments)    Other reaction(s): Unknown Nightmares Reaction:  Nightmares   . Vistaril [Hydroxyzine Hcl] Nausea And Vomiting  . Haldol [Haloperidol Lactate] Anxiety and Other (See Comments)    Reaction:  Nightmares     Patient Measurements: Height: 6' (182.9 cm) Weight: 229 lb 15 oz (104.3 kg) IBW/kg (Calculated) : 77.6 Heparin Dosing Weight: 97.7 kg  Vital Signs: Temp: 99 F (37.2 C) (12/06 0721) Temp Source: Oral (12/06 0721) BP: 117/63 (12/06 0800) Pulse Rate: 101 (12/06 0800)  Labs: Recent Labs    09/21/19 0421 09/22/19 0441 09/22/19 0927 09/23/19 0447  HGB 10.3*  --  9.7* 9.2*  HCT 33.0*  --  30.9* 29.7*  PLT 195  --  209 222  HEPARINUNFRC 0.33 0.32  --  0.33  CREATININE 1.42* 1.39*  --  1.38*    Estimated Creatinine Clearance: 72 mL/min (A) (by C-G formula based on SCr of 1.38 mg/dL (H)).   Assessment: 58 yr old male presented with acute onset of encephalopathy - found to have seizure-like activity, with CT head showing infarcts in L hemisphere. Also, duplex finding acute DVT in L peroneal, common femoral, proximal profunda, and popliteal veins. No anticoagulation PTA.  Pharmacy consulted to dose heparin.  Heparin level remains therapeutic, cbc stable   Goal of Therapy:  Heparin level 0.3-0.5 units/ml Monitor platelets by anticoagulation protocol: Yes   Plan:  Continue heparin gtt at 1050 units/hr Daily heparin level, CBC, s/s bleeding  Barth Kirks, PharmD, BCPS, BCCCP Clinical Pharmacist [phone]  Please check AMION for all [place] numbers  09/23/2019 9:10 AM

## 2019-09-23 NOTE — Progress Notes (Signed)
[place] for Heparin Indication: DVT and stroke  Allergies  Allergen Reactions  . Quetiapine Anaphylaxis  . Seroquel [Quetiapine Fumarate] Anxiety and Other (See Comments)    Reaction:  Nightmares   . Trazodone Anaphylaxis  . Trazodone And Nefazodone Anxiety and Other (See Comments)    Other reaction(s): Unknown Nightmares Reaction:  Nightmares   . Vistaril [Hydroxyzine Hcl] Nausea And Vomiting  . Haldol [Haloperidol Lactate] Anxiety and Other (See Comments)    Reaction:  Nightmares     Patient Measurements: Height: 6' (182.9 cm) Weight: 229 lb 15 oz (104.3 kg) IBW/kg (Calculated) : 77.6 Heparin Dosing Weight: 97.7 kg  Vital Signs: Temp: 99 F (37.2 C) (12/06 0721) Temp Source: Oral (12/06 0721) BP: 117/63 (12/06 0800) Pulse Rate: 108 (12/06 0900)  Labs: Recent Labs    09/21/19 0421 09/22/19 0441 09/22/19 0927 09/23/19 0447  HGB 10.3*  --  9.7* 9.2*  HCT 33.0*  --  30.9* 29.7*  PLT 195  --  209 222  HEPARINUNFRC 0.33 0.32  --  0.33  CREATININE 1.42* 1.39*  --  1.38*    Estimated Creatinine Clearance: 72 mL/min (A) (by C-G formula based on SCr of 1.38 mg/dL (H)).   Assessment: 58 yr old male presented with acute onset of encephalopathy - found to have seizure-like activity, with CT head showing infarcts in L hemisphere. Also, duplex finding acute DVT in L peroneal, common femoral, proximal profunda, and popliteal veins. No anticoagulation PTA.  Pharmacy consulted to dose heparin.  Heparin level remains therapeutic, cbc stable   Goal of Therapy:  Heparin level 0.3-0.5 units/ml Monitor platelets by anticoagulation protocol: Yes   Plan:  Continue heparin gtt at 1050 units/hr Daily heparin level, CBC, s/s bleeding  Barth Kirks, PharmD, BCPS, BCCCP Clinical Pharmacist [phone]  Please check AMION for all [place] numbers  09/23/2019 9:10 AM

## 2019-09-23 NOTE — Progress Notes (Signed)
STROKE TEAM PROGRESS NOTE   INTERVAL HISTORY Patient improved again today. Can follow commands intermittently such as squeeze hand, lifts arm with visual cues, moves foot when asked to wiggle toes. Significant improvement. EEG was kept on overnight, will continue until Monday.  OBJECTIVE Vitals:   09/23/19 1300 09/23/19 1400 09/23/19 1410 09/23/19 1415  BP: 118/79 121/80  127/75  Pulse: (!) 101 (!) 114 (!) 111 (!) 110  Resp: (!) 22 (!) 29 (!) 32 (!) 36  Temp:      TempSrc:      SpO2: 97% 97% 92% (!) 89%  Weight:      Height:        CBC:  Recent Labs  Lab 09/22/19 0927 09/23/19 0447  WBC 9.7 8.9  NEUTROABS 7.3 7.0  HGB 9.7* 9.2*  HCT 30.9* 29.7*  MCV 98.1 98.0  PLT 209 [id number]    Basic Metabolic Panel:  Recent Labs  Lab 09/17/19 1023 09/18/19 0345  09/22/19 0441 09/23/19 0447  NA  --  141   < > 146* 148*  K  --  4.9   < > 4.8 4.4  CL  --  109   < > 114* 113*  CO2  --  24   < > 25 24  GLUCOSE  --  284*   < > 195* 194*  BUN  --  40*   < > 48* 49*  CREATININE  --  1.10   < > 1.39* 1.38*  CALCIUM  --  8.6*   < > 9.1 9.0  MG 2.0 2.0  --   --   --   PHOS  --  3.2   < > 5.2* 4.5   < > = values in this interval not displayed.    Lipid Panel:     Component Value Date/Time   CHOL 122 09/17/2019 0312   CHOL 184 11/11/2016 1007   TRIG 158 (H) 09/17/2019 0312   HDL 21 (L) 09/17/2019 0312   HDL 25 (L) 11/11/2016 1007   CHOLHDL 5.8 09/17/2019 0312   VLDL 32 09/17/2019 0312   LDLCALC 69 09/17/2019 0312   LDLCALC 118 (H) 11/11/2016 1007   HgbA1c:  Lab Results  Component Value Date   HGBA1C 8.1 (H) 09/13/2019   Urine Drug Screen:     Component Value Date/Time   LABOPIA NONE DETECTED 09/12/2019 1326   COCAINSCRNUR NONE DETECTED 09/12/2019 1326   COCAINSCRNUR NONE DETECTED 12/09/2015 2242   LABBENZ NONE DETECTED 09/12/2019 1326   AMPHETMU NONE DETECTED 09/12/2019 1326   THCU NONE DETECTED 09/12/2019 1326   LABBARB NONE DETECTED 09/12/2019 1326    Alcohol Level      Component Value Date/Time   ETH <10 09/12/2019 1326    IMAGING  Ct Angio Head W Or Wo Contrast Ct Angio Neck W Or Wo Contrast 09/17/2019 1. Negative for large vessel occlusion. Diminutive appearance of the intracranial circulation but no circle-of-Willis branch occlusion or significant intracranial stenosis identified. 2. Positive for severe stenosis vs. subtotal occlusion of the non-dominant proximal Right Vertebral Artery. This right vertebral terminates in PICA. 3. Positive also for high-grade Right ICA origin stenosis due to bulky calcified plaque, although the degree of this stenosis is numerically underestimated due to diminutive downstream right ICA (3 mm diameter). 4. The left ICA is similarly diminutive, but with no superimposed stenosis despite atherosclerosis. 5. Progressive patchy hypodensity in the left frontal lobe since 09/15/2019 suggests ischemia rather than postictal changes on the MRI yesterday. No associated  hemorrhage or mass effect. 6. No new intracranial abnormality.   Dg Chest Port 1 View 09/21/2019 Progressive consolidation RIGHT lower lobe suspicious for pneumonia. 09/18/2019 1. No acute cardiopulmonary disease or significant interval change. 2. Stable support apparatus. 3. Low lung volumes.    Ct Head Wo Contrast 09/16/2019 IMPRESSION:  1. No acute intracranial hemorrhage.  2. Ill-defined areas of hypodensity in the left frontal convexity and left basal ganglia, new since the prior CT and concerning for acute infarct. Further evaluation with MRI is recommended.   Mr Brain 56 Contrast Mr Angio Head Wo Contrast Mr Angio Neck Wo Contrast 09/16/2019 IMPRESSION:  1. Large amount of abnormal diffusion restriction, likely postictal, within the left hemisphere, predominantly cortical/subcortical.  2. Motion degraded MRA of the head and neck. No emergent large vessel occlusion or high-grade stenosis.   Vas [place] Lower Extremity Venous (dvt) 09/16/2019 Summary:   Right: There is no evidence of deep vein thrombosis in the lower extremity. No cystic structure found in the popliteal fossa.  Left: Findings consistent with acute deep vein thrombosis involving the left peroneal veins, left common femoral vein, left proximal profunda vein, and left popliteal vein. No cystic structure found in the popliteal fossa. Preliminary    Transthoracic Echocardiogram   1. Left ventricular ejection fraction, by visual estimation, is 60 to 65%. The left ventricle has normal function. Left ventricular septal wall thickness was mildly increased. Mildly increased left ventricular posterior wall thickness. There is mildly  increased left ventricular hypertrophy.  2. Global right ventricle has normal systolic function.The right ventricular size is normal. No increase in right ventricular wall thickness.  3. Left atrial size was normal.  4. Right atrial size was normal.  5. The mitral valve is normal in structure. Trace mitral valve regurgitation. No evidence of mitral stenosis.  6. The tricuspid valve is normal in structure. Tricuspid valve regurgitation is trivial.  7. The aortic valve is normal in structure. Aortic valve regurgitation is not visualized. No evidence of aortic valve sclerosis or stenosis.  8. The pulmonic valve was normal in structure. Pulmonic valve regurgitation is not visualized.  9. Normal pulmonary artery systolic pressure. 10. The inferior vena cava is normal in size with greater than 50% respiratory variability, suggesting right atrial pressure of 3 mmHg.  ECG - ST rate 122 BPM. (See cardiology reading for complete details)  EEG - This EEG recorded evidence of a generalized nonspecific cerebral dysfunction (encephalopathy) in addition to focal dysfunction.  Though the prominence of slow activity over the right hemisphere would be suggestive of a focal right sided dysfunction, my suspicion is that this represents a generalized dysfunction with attenuation  of the frequencies on the left due to his known stroke.  There is also evidence of left-sided dysfunction in the attenuation of the posterior dominant rhythm on that side. There was no seizure or seizure predisposition recorded on this study.   Overnight EEG w Video 09/21/2019 IMPRESSION: This study showed frequent seizures arising from left hemisphere.  During one of the seizures when event button was pressed on 09/20/2019 at 2237, patient was noted to have forced right gaze deviation.  However other seizures were subclinical/ without any definite clinical manifestations.  Additionally, there was evidence of cortical dysfunction in left hemisphere likely secondary to underlying abnormality as well as moderate diffuse encephalopathy, nonspecific etiology.   PHYSICAL EXAM  Blood pressure 127/75, pulse (!) 110, temperature 100 F (37.8 C), temperature source Oral, resp. rate (!) 36, height 6' (1.829 m), weight  104.3 kg, SpO2 (!) 89 %.  General - Well nourished, poorly groomed, intubated no sedation.  Ophthalmologic - fundi not visualized due to noncooperation.  Cardiovascular - Regular rhythm but tachycardia.  Neuro - intubatednot onsedation, eyes open on voice,Can follow commands intermittently such as squeeze hand, lifts arm with visual cues, moves foot when asked to wiggle toes. Eyes in left gaze preferenceposition, not cross midlin,blinking to visual threat on the left but not on the right, doll's eyespresent, trackingon the left, PERRL. Corneal reflexpositive on the left, absent on the right, gag and coughpresent. Breathing over the vent. Facial symmetry not able to test due to ET tube. Tongue protrusion not cooperative. On pain stimulation, no movement of RUE and slight withdraw on the BLE. LUE able to hold against gravity briefly on left bicep.DTR 1+ and bilateral positivebabinski. Sensation, coordination and gait not tested.  ASSESSMENT/PLAN [name] is a 58 y.o.  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  MRI head -  Large amount of abnormal diffusion restriction within the left hemisphere, predominantly cortical/subcortical.  MRA H&N - Motion degraded MRA of the head and neck. No emergent large vessel occlusion or high-grade stenosis.    CTA H&N High grade R ICA origin stenosis. Small L ICA.  LE Venous Dopplers - LLE acute DVT  TCD with bubble study - no PFO  2D Echo EF 60-65%  Hold off other embolic stroke work up for now given on anticoagulation for DVT  [employer] Virus 2 - negative  LDL - 69  HgbA1c - 8.1  UDS - negative  VTE prophylaxis - Heparin IV   aspirin 81 mg daily prior to admission, now on aspirin 325 and heparin IV per stroke protocol   Therapy recommendations:  pending  Disposition:  Pending  Seizure   11/28 With GTC and left gaze, lasting 30 sec  12/1 with eye flattering and upper left gaze, resolved after 4mg  versed  EEG no seizure, generalized and localized encephalopathy  On keppra bid and Ativan PRN  Recurrent seizure-like activity around 7p 12/1, 4a 12/2, 7a 12/3  Pt was on Keppra 1500mg  bid -> changed to Dilantin with pharmacy dosing. Dilantin subtherapeutic today - 8.1 Discussed with Dr Marcelle Overlie - called pharmacy -> they will address.  repeat EEG 12/2 encephalopathy but  no seizure   Now on LTM EEG - 09/21/19 - frequent seizures arising from left hemisphere.  One with forced right gaze deviation ; however, other seizures were subclinical / without any definite clinical manifestations.  Additionally, there was evidence of cortical dysfunction in left hemisphere likely secondary to underlying abnormality as well as moderate diffuse encephalopathy, nonspecific etiology. EEG improved today no seizures seen, following commands intermittently.  LE DVT  LE Venous Dopplers - LLE acute DVT  Unclear etiology   On heparin drip per stroke protocol    CBC monitoring  Acute Hypoxemic Respiratory failure  Intubated off sedation  CCM on board  Not able to extubate  Encephalopathy due to metabolic acidosis, AKI, hypernatremia  Creatinine 3.81-1.55-1.31-1.05-1.10-1.18-1.25-1.30->1.39->1.38  Sodium 152-148-146-133-141-142-145-144->146->148  Off 1/2NS and free water, on LR  Nephrology on board  Hypertension  Home BP meds: Zestril ; Toprol-XL  Current BP meds: metoprolol 12.5 bid     Stable fluctuating . Long-term BP goal normotensive  Hyperlipidemia  Home Lipid lowering medication: Lipitor 40 mg daily  LDL - 69, at goal < 70  Current lipid lowering medication: Lipitor 40 mg daily   Continue statin at discharge  Diabetes type II, uncontrolled  Home diabetic meds: Insulin; metformin  Current diabetic meds: levemir  HgbA1c 8.1, goal < 7.0  SSI  CBG monitoring  On levimir  Other Stroke Risk Factors  Cigarette smoker - will advise to stop smoking  ETOH use, advised to drink no more than 1 alcoholic beverage per day.  Hx substance abuse  Obesity, Body mass index is 31.19 kg/m., recommend weight loss, diet and exercise as appropriate   Coronary artery disease  Substance Abuse hx - cocaine ; benzodiazepines    Medical non compliance  Other Active Problems  Acute hypoxemic respiratory failure on vent.  Acute toxic metabolic  encephalopathy  Schizoaffective d/o  MDD  Hx suicide atempts  Normocytic anemia - Hb - 10.3->9.7->9.2  Thrombocytopenia - resolved - 209->222  Hyperkalemia 5.2 after supplement  Now 4.8->4.4  LUE blister  Hematuria Hb 10.7, monitor given need for IV heparin for acute DVT  Temp - 99.7 (WBCs - 9.7)->100  Phos - 4.8->5.2->4.5  Hospital day # 11  This patient is critically ill and at significant risk of neurological worsening, death and care requires constant monitoring of vital signs, hemodynamics,respiratory and cardiac monitoring,review of multiple databases, neurological assessment, discussion with family, other specialists and medical decision making of high complexity.I  I spent 30 minutes of neurocritical care time in the care of this patient.      To contact Stroke Continuity provider, please refer to [email or web address] After hours, contact General Neurology

## 2019-09-23 NOTE — Procedures (Signed)
Extubation Procedure Note  Patient Details:   Name: [name] DOB: May 12, 1961 MRN: [id number]   Airway Documentation:    Vent end date: 09/23/19 Vent end time: 1400   Evaluation  O2 sats: currently acceptable Complications: No apparent complications Patient did tolerate procedure well. Bilateral Breath Sounds: Diminished, Rhonchi   Patient extubated to 6L  per MD order. Patient has good strong cough post extubation. MD now requests humidified face mask to help with secretions.  Kathie Dike 09/23/2019, 2:08 PM

## 2019-09-23 NOTE — Progress Notes (Signed)
NAME:  [name], MRN:  [id number], DOB:  03/27/1961, LOS: 11 ADMISSION DATE:  09/12/2019, CONSULTATION DATE: 11/25 REFERRING MD:  [name] Nine, DO CHIEF COMPLAINT: Toxic ingestion, altered mental status  Brief History   [name] is a 58 year old gentleman with a history of alcohol abuse disorder, schizophrenia, diabetes who presented from jail for drunk and disorderly conduct and presented to [facility] emergency department on 11/25.  In jail he was found to be obtunded and tachypneic and was therefore brought to the ED. given his history of ethylene glycol toxicity, multiple suicide attempts, and labs consistent with metabolic acidosis and acute kidney injury, he was treated empirically as ethylene glycol toxicity.  He was intubated in the [facility] on admission and is now 104M ICU.  Over the course of his hospital stay he was passing his SBT's, but was unable to be extubated for mental status.  His urine and serum testing for ethylene glycol was a send out lab at [facility], and was negative.  His urine was positive only for ketones.  Fomepizole was stopped after 48 hours, and once his acid-base disorder had resolved. Imaging + Acute L MCA/ACA stroke. Patient has remained persistently encephalopathic requiring mechanical ventilation.   Consults:  11/26: Nephrology, off 11/29: neurology  Micro Data:  11/25, Covid negative 11/25 BCx> no growth  11/26 sputum culture rare gram-positive cocci 11/26 MRSA PCR negative 11/26 tracheal aspirate> normal flora   Significant Diagnostic Tests:  11/25 CT H> no acute intracranial abnormality 11/28 CT H> L frontal hypodensity 11/29 MRI/A h/n>L hemispheric abnormal diffusion restriction. MRA without large vessel occlusion or stenosis however exam limited by motion degradation  11/29 ECHO> LVEF 60-65%, no septal defect 11/30 CT angio head/neck> Progressive patchy hypodensity L frontal lobe, high-grade R ICA stenosis, Stenosis vs subtotal occlusion of  non-dominant proximal R vertebral artery, no large vessel occlusion  12/1 CXR> Low lung volumes, no focal ASD  EEG 12/2>>> This study is suggestive of cortical dysfunction in left hemisphere likely secondary to underlying abnormality.  In addition there is evidence of moderate diffuse encephalopathy, nonspecific etiology. No seizures or epileptiform discharges were seen throughout the recording. eeg 12/4: L hemisphere sz  Antimicrobials:  S/p metronidazole x1 Received 48 hours empiric vancomycin and cefepime  Interim history/subjective:  12/4: pt with sz overnight on LTM. Started on phenytoin. This am he is more alert, attempting to move B/L toes. Tracking with eyes. Not yet nodding/shaking head or other commands.  12/3: Some hematuria. Remains on heparin gtt for DVT. Tol PS 5/5.   Objective   Blood pressure (!) 141/74, pulse (!) 105, temperature 99 F (37.2 C), temperature source Oral, resp. rate (!) 24, height 6' (1.829 m), weight 104.3 kg, SpO2 98 %.    Vent Mode: PRVC FiO2 (%):  [40 %] 40 % Set Rate:  [16 bmp] 16 bmp Vt Set:  [600 mL] 600 mL PEEP:  [5 cmH20] 5 cmH20 Pressure Support:  [8 cmH20] 8 cmH20 Plateau Pressure:  [15 cmH20] 15 cmH20   Intake/Output Summary (Last 24 hours) at 09/23/2019 0756 Last data filed at 09/23/2019 0700 Gross per 24 hour  Intake 2711.97 ml  Output 1825 ml  Net 886.97 ml   Filed Weights   09/21/19 0500 09/22/19 0452 09/23/19 0400  Weight: 103.5 kg 105.3 kg 104.3 kg    Examination: General:  Chronically ill appearing, NAD on vent  HENT:  NCAT ETT, mm moist  Lungs: resps even non labored on vent, clear bilaterally today  Cardiac: RRR, SR on telemetry  Abdomen: obese, soft, non tender, nondistended Extremities: No deformities, BUE Edema R>L. Boots on LEs, dressing on right heel pressure sore removed to expose ~2cm healing ~stage 1 pressure sore, with good granulation, scab.  Neuro: CN III-XII grossly intact, tongue midline, facial expression  equal. Will move L extremities/squeeze on request .  RUE flaccid, no w/d to stim.  (overall modest progression compared to 12/5, which was improved from 12/4)  Assessment & Plan:   Acute ischemic stroke -L frontal infarct; multiple punctate areas of restriction diffusion vs embolic infarcts -High-grade R ICA stenosis, severe stenosis vs subtotal occlusion of non-dominant proximal R vertebral artery  -Secondary stroke work up: LDL 69, hgbA1c: 81, TTE: No PFO.  + Lower extremity DVT  P: -Neurology following, continuous EEG still ongoing -ASA/Statin per Neuro.  Heparin drip for LE DVT  New onset tonic-clonic seizure -EEG without seizure; but with general and focal dysfunction-- L sided dysfunction and attenuation, R sided dysfunction, likely in setting of known stroke P -Neurology following, L hemisphere sz overnight 12/3 -started on phenytoin 12/4; level normalizing -Keppra 1500 mg BID  -LTM continues  Acute hypoxemic respiratory failure, improving P: Vent support - 6cc/kg  F/u CXR--trach aspirate pending  -will continue SBT/PS today with more alert status;consider extubation his afternoon if neuro exam stable, tolerates SBT    Toxic metabolic encephalopathy -ethylene glycol neg at [facility] -acetone level mildly elevated  P - Thiamine - Delirium prevention  Hypertension -Home meds: Imdur 30 mg daily, lisinopril 10 mg daily, metoprolol 25 mg BID P: -resumed imdur 12/3 -cont increase metoprolol dosing for bp and hr control  AKI P -was improving but had a slight bump overnight -Continue supportive care and monitor I/O -uop increased yesterday (>2L output) -will add free water Hypernatremia:  -free water Hyperkalemia:  -resolved  DM2 -persistent hyperglycemia P -Resistant SSI + Scheduled insulin 7 units q 4 hours -increase levemir to 40 mg BID  Hx EtOH abuse P -thiamine, folate   LLE DVT P -IV heparin per pharmacy   Thrombocytopenia, mild -stable  P: -No  acute intereventions  Schizoaffective disorder -Home meds: Hydroxyzine and Risperdone (newly prescribed)  MDD -h/o multiple suicide attempts P  -holding home meds in setting of encephalopathy.  Will need psych eval once neurological exam improved  LUE blister P -qD and PRN clean with warm soapy water, cover with thin layer of bacitracin and xeroform   LLE 2 cm pressure sore healing, sponge/gauze  Best practice:  Diet: TF Pain/Anxiety/Delirium protocol (if indicated): PRN fentanyl VAP protocol (if indicated): Yes DVT prophylaxis: heparin gtt GI prophylaxis: PPI Glucose control: SSI resistant, BID Levimir, EN coverage  Mobility: bed rest Code Status: full code Family Communication:  Will attempt to contact: Berenice Primas: Niece [phone] Disposition: ICU   Labs   CBC: Recent Labs  Lab 09/19/19 0428 09/20/19 0414 09/21/19 0421 09/22/19 0927 09/23/19 0447  WBC 7.6 8.9 8.2 9.7 8.9  NEUTROABS  --   --   --  7.3 7.0  HGB 10.7* 10.7* 10.3* 9.7* 9.2*  HCT 33.8* 33.7* 33.0* 30.9* 29.7*  MCV 96.6 97.1 97.3 98.1 98.0  PLT 137* 162 195 209 [id number]    Basic Metabolic Panel: Recent Labs  Lab 09/17/19 1023 09/18/19 0345 09/19/19 0427 09/19/19 0950 09/20/19 0414 09/21/19 0421 09/22/19 0441 09/23/19 0447  NA  --  141 142 145 144 146* 146* 148*  K  --  4.9 5.2* 5.1 5.2* 5.0 4.8 4.4  CL  --  109 108 111 112* 113* 114* 113*  CO2  --  24 24 24 23 24 25 24   GLUCOSE  --  284* 285* 291* 302* 293* 195* 194*  BUN  --  40* 38* 39* 46* 46* 48* 49*  CREATININE  --  1.10 1.18 1.25* 1.30* 1.42* 1.39* 1.38*  CALCIUM  --  8.6* 8.7* 8.8* 9.0 9.1 9.1 9.0  MG 2.0 2.0  --   --   --   --   --   --   PHOS  --  3.2 4.0  --  4.4 4.8* 5.2* 4.5   GFR: Estimated Creatinine Clearance: 72 mL/min (A) (by C-G formula based on SCr of 1.38 mg/dL (H)). Recent Labs  Lab 09/20/19 0414 09/21/19 0421 09/22/19 0927 09/23/19 0447  WBC 8.9 8.2 9.7 8.9    Liver Function Tests: Recent Labs  Lab  09/18/19 0345 09/19/19 0427 09/20/19 0414 09/21/19 0421 09/22/19 0441 09/23/19 0447  AST 28  --   --   --   --   --   ALT 38  --   --   --   --   --   ALKPHOS 55  --   --   --   --   --   BILITOT 0.4  --   --   --   --   --   PROT 5.1*  --   --   --   --   --   ALBUMIN 2.3* 2.2* 2.3* 2.3* 2.2* 2.1*   No results for input(s): LIPASE, AMYLASE in the last 168 hours. No results for input(s): AMMONIA in the last 168 hours.  ABG    Component Value Date/Time   PHART 7.377 09/13/2019 0559   PCO2ART 32.5 09/13/2019 0559   PO2ART 118 (H) 09/13/2019 0559   HCO3 18.4 (L) 09/13/2019 0559   TCO2 19 (L) 05/30/2019 0535   ACIDBASEDEF 5.6 (H) 09/13/2019 0559   O2SAT 98.0 09/13/2019 0559     Coagulation Profile: No results for input(s): INR, PROTIME in the last 168 hours.  Cardiac Enzymes: No results for input(s): CKTOTAL, CKMB, CKMBINDEX, TROPONINI in the last 168 hours.  HbA1C: HB A1C (BAYER DCA - WAIVED)  Date/Time Value Ref Range Status  11/11/2016 11:32 AM 7.6 (H) <7.0 % Final    Comment:                                          Diabetic Adult            <7.0                                       Healthy Adult        4.3 - 5.7                                                           (DCCT/NGSP) American Diabetes Association's Summary of Glycemic Recommendations for Adults with Diabetes: Hemoglobin A1c <7.0%. More stringent glycemic goals (A1c <6.0%) may further reduce complications at the cost of increased risk of hypoglycemia.    Hgb A1c MFr  Bld  Date/Time Value Ref Range Status  09/13/2019 05:45 AM 8.1 (H) 4.8 - 5.6 % Final    Comment:    (NOTE) Pre diabetes:          5.7%-6.4% Diabetes:              >6.4% Glycemic control for   <7.0% adults with diabetes   05/03/2019 09:40 AM 8.8 (H) 4.8 - 5.6 % Final    Comment:    (NOTE) Pre diabetes:          5.7%-6.4% Diabetes:              >6.4% Glycemic control for   <7.0% adults with diabetes     CBG: Recent Labs   Lab 09/22/19 1525 09/22/19 1932 09/22/19 2352 09/23/19 0349 09/23/19 0716  GLUCAP 191* 205* 188* 187* 144*    Critical care time: The patient is critically ill with multiple organ systems failure and requires high complexity decision making for assessment and support, frequent evaluation and titration of therapies, application of advanced monitoring technologies and extensive interpretation of multiple databases.  Critical care time 36  mins. This represents my time independent of the NPs time taking care of the pt. This is excluding procedures.   Bonna Gains 09/23/2019, 8:00 AM

## 2019-09-23 NOTE — Progress Notes (Signed)
MEDICATION RELATED CONSULT NOTE - INITIAL   Pharmacy Consult for Phenytoin Indication: Seizures  Allergies  Allergen Reactions  . Quetiapine Anaphylaxis  . Seroquel [Quetiapine Fumarate] Anxiety and Other (See Comments)    Reaction:  Nightmares   . Trazodone Anaphylaxis  . Trazodone And Nefazodone Anxiety and Other (See Comments)    Other reaction(s): Unknown Nightmares Reaction:  Nightmares   . Vistaril [Hydroxyzine Hcl] Nausea And Vomiting  . Haldol [Haloperidol Lactate] Anxiety and Other (See Comments)    Reaction:  Nightmares     Patient Measurements: Height: 6' (182.9 cm) Weight: 229 lb 15 oz (104.3 kg) IBW/kg (Calculated) : 77.6 Adjusted Body Weight: 88kg  Vital Signs: Temp: 99 F (37.2 C) (12/06 0721) Temp Source: Oral (12/06 0721) BP: 117/63 (12/06 0800) Pulse Rate: 108 (12/06 0900) Intake/Output from previous day: 12/05 0701 - 12/06 0700 In: 2712 [I.V.:802; NG/GT:1710; IV Piggyback:200] Out: 1825 [Urine:1825] Intake/Output from this shift: Total I/O In: 126.1 [I.V.:61.1; NG/GT:65] Out: -   Labs: Recent Labs    09/21/19 0421 09/22/19 0441 09/22/19 0927 09/23/19 0447  WBC 8.2  --  9.7 8.9  HGB 10.3*  --  9.7* 9.2*  HCT 33.0*  --  30.9* 29.7*  PLT 195  --  209 222  CREATININE 1.42* 1.39*  --  1.38*  PHOS 4.8* 5.2*  --  4.5  ALBUMIN 2.3* 2.2*  --  2.1*   Estimated Creatinine Clearance: 72 mL/min (A) (by C-G formula based on SCr of 1.38 mg/dL (H)).   Assessment: 22 YOM with seizure like activity this admission, seizure on EEG and fosphenytoin load 1500mg  PE x 1 12/4  Patient on continuous EEG with continued seizures.  Lvls have been taken daily, corrected lvl 12.5 today  Dr Hortense Ramal requesting to target 15 - 20  Plan:  Increase phenytoin to 125 mg q8h F/u lvls in a few days  Barth Kirks, PharmD, BCPS, BCCCP Clinical Pharmacist [phone]  Please check AMION for all [facility] numbers  09/23/2019 9:37 AM

## 2019-09-23 NOTE — Progress Notes (Signed)
eLink Physician-Brief Progress Note Patient Name: [name] DOB: 1961-01-02 MRN: [id number]   Date of Service  09/23/2019  HPI/Events of Note  Multiple issues: 1. Not able to give Metoprolol PO - No NGT and 2. Request for Hypoglycemia orders.   eICU Interventions  Will order: 1. Metoprolol 2.5 - 5 mg IV Q 3 hours PRN HR > 155, SBP > 160 or DBP > 100. 2. Diabetic Hypoglycemia order set.      Intervention Category Major Interventions: Other:;Arrhythmia - evaluation and management  Sommer,Steven Eugene 09/23/2019, 8:22 PM

## 2019-09-23 NOTE — Procedures (Addendum)
Patient Name:[name] [id number] Epilepsy Attending:Jeilyn Reznik Barbra Sarks Referring Physician/Provider:Dr. Areatha Keas Duration:09/22/2019 1239 to 12/6/20201239  Patient history:58 year old male who presented with right-sided weakness, left gaze deviation and seizure. EEG evaluate for seizures.  Level of alertness:Awake, asleep  AEDs during EEG study:Keppra, PHT  Technical aspects: This EEG study was done with scalp electrodes positioned according to the 10-20 International system of electrode placement. Electrical activity was acquired at a sampling rate of 500Hz  and reviewed with a high frequency filter of 70Hz  and a low frequency filter of 1Hz . EEG data were recorded continuously and digitally stored.  Description: During awake state, no clear posterior dominant rhythm was seen. Sleep was characterized by sleep spindles (12-14Hz ), maximal frontocentral.EEG showed continuous generalized and lateralized left hemisphere 3 to 5 Hz theta-delta slowing. Hyperventilation photic stimulation not performed.  Two seizures without clinical signs were noted on 09/22/2019 at 1732 and on 09/23/2019 at 0026, lasting around 10 minutes.  EEG showed rhythmic3-5Hz  theta-delta slowing with overriding 13 to 15 Hz beta activity in left hemisphere which then evolved to involve right hemisphere as well.   Abnormality -Seizures, left hemisphere -Continuous slow, generalized and lateralized left hemisphere  IMPRESSION: This studyshowed two subclinical seizures arising from left hemisphere, lasting around 10 minutes. Additionally, there was evidence ofcortical dysfunction in left hemisphere likely secondary to underlying abnormality as well asmoderate diffuse encephalopathy, nonspecific etiology.    Jacquel Mccamish Barbra Sarks

## 2019-09-24 DIAGNOSIS — R569 Unspecified convulsions: Secondary | ICD-10-CM

## 2019-09-24 DIAGNOSIS — I6310 Cerebral infarction due to embolism of unspecified precerebral artery: Secondary | ICD-10-CM

## 2019-09-24 DIAGNOSIS — J155 Pneumonia due to Escherichia coli: Secondary | ICD-10-CM

## 2019-09-24 DIAGNOSIS — J690 Pneumonitis due to inhalation of food and vomit: Secondary | ICD-10-CM

## 2019-09-24 DIAGNOSIS — N179 Acute kidney failure, unspecified: Secondary | ICD-10-CM | POA: Diagnosis not present

## 2019-09-24 LAB — CBC WITH DIFFERENTIAL/PLATELET
Abs Immature Granulocytes: 0.09 10*3/uL — ABNORMAL HIGH (ref 0.00–0.07)
Basophils Absolute: 0 10*3/uL (ref 0.0–0.1)
Basophils Relative: 0 %
Eosinophils Absolute: 0.2 10*3/uL (ref 0.0–0.5)
Eosinophils Relative: 2 %
HCT: 31.5 % — ABNORMAL LOW (ref 39.0–52.0)
Hemoglobin: 9.9 g/dL — ABNORMAL LOW (ref 13.0–17.0)
Immature Granulocytes: 1 %
Lymphocytes Relative: 11 %
Lymphs Abs: 1.2 10*3/uL (ref 0.7–4.0)
MCH: 30.8 pg (ref 26.0–34.0)
MCHC: 31.4 g/dL (ref 30.0–36.0)
MCV: 98.1 fL (ref 80.0–100.0)
Monocytes Absolute: 0.6 10*3/uL (ref 0.1–1.0)
Monocytes Relative: 6 %
Neutro Abs: 8.1 10*3/uL — ABNORMAL HIGH (ref 1.7–7.7)
Neutrophils Relative %: 80 %
Platelets: 239 10*3/uL (ref 150–400)
RBC: 3.21 MIL/uL — ABNORMAL LOW (ref 4.22–5.81)
RDW: 14.1 % (ref 11.5–15.5)
WBC: 10.2 10*3/uL (ref 4.0–10.5)
nRBC: 0 % (ref 0.0–0.2)

## 2019-09-24 LAB — RENAL FUNCTION PANEL
Albumin: 2.1 g/dL — ABNORMAL LOW (ref 3.5–5.0)
Anion gap: 11 (ref 5–15)
BUN: 41 mg/dL — ABNORMAL HIGH (ref 6–20)
CO2: 24 mmol/L (ref 22–32)
Calcium: 9.4 mg/dL (ref 8.9–10.3)
Chloride: 117 mmol/L — ABNORMAL HIGH (ref 98–111)
Creatinine, Ser: 1.37 mg/dL — ABNORMAL HIGH (ref 0.61–1.24)
GFR calc Af Amer: >60 mL/min (ref >60)
GFR calc non Af Amer: 56 mL/min — ABNORMAL LOW (ref >60)
Glucose, Bld: 86 mg/dL (ref 70–99)
Phosphorus: 5.5 mg/dL — ABNORMAL HIGH (ref 2.5–4.6)
Potassium: 4.3 mmol/L (ref 3.5–5.1)
Sodium: 152 mmol/L — ABNORMAL HIGH (ref 135–145)

## 2019-09-24 LAB — MISCELLANEOUS TEST

## 2019-09-24 LAB — GLUCOSE, CAPILLARY
Glucose-Capillary: 73 mg/dL (ref 70–99)
Glucose-Capillary: 65 mg/dL — ABNORMAL LOW (ref 70–99)
Glucose-Capillary: 129 mg/dL — ABNORMAL HIGH (ref 70–99)
Glucose-Capillary: 106 mg/dL — ABNORMAL HIGH (ref 70–99)
Glucose-Capillary: 101 mg/dL — ABNORMAL HIGH (ref 70–99)
Glucose-Capillary: 97 mg/dL (ref 70–99)
Glucose-Capillary: 89 mg/dL (ref 70–99)

## 2019-09-24 LAB — PHENYTOIN LEVEL, TOTAL: Phenytoin Lvl: 7.5 ug/mL — ABNORMAL LOW (ref 10.0–20.0)

## 2019-09-24 LAB — CULTURE, RESPIRATORY W GRAM STAIN: Special Requests: NORMAL

## 2019-09-24 LAB — HEPARIN LEVEL (UNFRACTIONATED): Heparin Unfractionated: 0.36 IU/mL (ref 0.30–0.70)

## 2019-09-24 MED ORDER — FUROSEMIDE 10 MG/ML IJ SOLN
40.0000 mg | Freq: Once | INTRAMUSCULAR | Status: AC
  Administered 2019-09-24: 40 mg via INTRAVENOUS
  Filled 2019-09-24: qty 4

## 2019-09-24 MED ORDER — SODIUM CHLORIDE 0.9 % IV SOLN
2.0000 g | INTRAVENOUS | Status: DC
  Administered 2019-09-24 – 2019-09-26 (×3): 2 g via INTRAVENOUS
  Filled 2019-09-24: qty 20
  Filled 2019-09-24 (×2): qty 2
  Filled 2019-09-24: qty 20

## 2019-09-24 MED ORDER — DEXTROSE 50 % IV SOLN
INTRAVENOUS | Status: AC
  Administered 2019-09-24: 50 mL via INTRAVENOUS
  Filled 2019-09-24: qty 50

## 2019-09-24 NOTE — Evaluation (Signed)
Occupational Therapy Evaluation Patient Details Name: [name] MRN: [id number] DOB: April 05, 1961 Today's Date: 09/24/2019    History of Present Illness Pt admitted from jail with rt sided weakness and seizure. 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 Pt with recurrent seizures. Pt intubated 11/25-12/6. PMH - polysubstance use, paranoid schizophrenia, COPD, DM, HTN, CAD    Clinical Impression   Pt presumed to have been independent and living alone prior to admission. He presents with R neglect, flaccid R UE, impaired cognition and generalized weakness. He requires +2 assist for bed mobility, but was able to sit x 10 min with min guard assist. Pt was unable to initiate standing. He is dependent in all ADL. VSS throughout session. Will follow acutely. Recommending SNF for further rehab.     Follow Up Recommendations  SNF;Supervision/Assistance - 24 hour    Equipment Recommendations  Other (comment)(defer to next venue)    Recommendations for Other Services       Precautions / Restrictions Precautions Precautions: Fall;Other (comment) Precaution Comments: seizures Restrictions Weight Bearing Restrictions: No      Mobility Bed Mobility Overal bed mobility: Needs Assistance Bed Mobility: Supine to Sit;Sit to Supine     Supine to sit: +2 for physical assistance;Total assist Sit to supine: +2 for physical assistance;Total assist   General bed mobility comments: assist for all aspects  Transfers                 General transfer comment: pt unable to stand    Balance Overall balance assessment: Needs assistance   Sitting balance-Leahy Scale: Fair Sitting balance - Comments: min guard x 10 minutes                                   ADL either performed or assessed with clinical judgement   ADL                                         General ADL Comments: requires total assist     Vision   Vision  Assessment?: Yes Eye Alignment: Within Functional Limits Alignment/Gaze Preference: Head tilt;Gaze left Additional Comments: R neglect     Perception     Praxis      Pertinent Vitals/Pain Pain Assessment: Faces Faces Pain Scale: No hurt Pain Location: generalized with supine to sit Pain Descriptors / Indicators: Moaning Pain Intervention(s): Limited activity within patient's tolerance;Monitored during session     Hand Dominance     Extremity/Trunk Assessment Upper Extremity Assessment Upper Extremity Assessment: RUE deficits/detail;LUE deficits/detail RUE Deficits / Details: flaccid RUE Coordination: decreased fine motor;decreased gross motor LUE Deficits / Details: generalized weakness LUE Coordination: decreased fine motor;decreased gross motor   Lower Extremity Assessment Lower Extremity Assessment: Defer to PT evaluation   Cervical / Trunk Assessment Cervical / Trunk Assessment: Other exceptions Cervical / Trunk Exceptions: maintains head turned to L, grimacing with gentle PROM toward R, able to turn head actively to midline with maximum verbal cues   Communication Communication Communication: Expressive difficulties(non verbal, nodded head inconsistently)   Cognition Arousal/Alertness: Awake/alert;Lethargic(alert once seated at EOB) Behavior During Therapy: Flat affect Overall Cognitive Status: Difficult to assess  General Comments: pt following commands inconsistently and with increased time, nodding head slightly to answer questions inconsistently   General Comments       Exercises     Shoulder Instructions      Home Living Family/patient expects to be discharged to:: Unsure                                        Prior Functioning/Environment Level of Independence: Independent        Comments: Assume pt lived alone and was independent, he is unable to report.        OT Problem List:  Decreased strength;Decreased activity tolerance;Impaired balance (sitting and/or standing);Decreased coordination;Decreased cognition;Impaired vision/perception;Decreased safety awareness;Decreased knowledge of use of DME or AE;Obesity;Impaired UE functional use;Pain      OT Treatment/Interventions: Self-care/ADL training;Neuromuscular education;DME and/or AE instruction;Therapeutic activities;Cognitive remediation/compensation;Visual/perceptual remediation/compensation;Patient/family education;Balance training    OT Goals(Current goals can be found in the care plan section) Acute Rehab OT Goals OT Goal Formulation: Patient unable to participate in goal setting Time For Goal Achievement: 10/08/19 Potential to Achieve Goals: Fair ADL Goals Pt Will Perform Grooming: with mod assist;sitting Additional ADL Goal #1: Pt will perform bed mobility with moderate assist in preparation for ADL. Additional ADL Goal #2: Pt will locate visual targets in R hemispace with moderate verbal cues. Additional ADL Goal #3: Pt will locate and assist R UE with his L during mobility to prevent injury. Additional ADL Goal #4: Pt will follow one step commands with 80% accuracy.  OT Frequency: Min 2X/week   Barriers to D/C:            Co-evaluation PT/OT/SLP Co-Evaluation/Treatment: Yes Reason for Co-Treatment: Complexity of the patient's impairments (multi-system involvement);For patient/therapist safety   OT goals addressed during session: Strengthening/ROM      AM-PAC OT "6 Clicks" Daily Activity     Outcome Measure Help from another person eating meals?: Total Help from another person taking care of personal grooming?: Total Help from another person toileting, which includes using toliet, bedpan, or urinal?: Total Help from another person bathing (including washing, rinsing, drying)?: Total Help from another person to put on and taking off regular upper body clothing?: Total Help from another person to  put on and taking off regular lower body clothing?: Total 6 Click Score: 6   End of Session Equipment Utilized During Treatment: Oxygen Nurse Communication: Mobility status  Activity Tolerance: Patient tolerated treatment well Patient left: in bed;with call bell/phone within reach;with bed alarm set;with restraints reapplied  OT Visit Diagnosis: Other symptoms and signs involving cognitive function;Muscle weakness (generalized) (M62.81);Hemiplegia and hemiparesis Hemiplegia - Right/Left: Right Hemiplegia - caused by: Cerebral infarction                Time: [id number] OT Time Calculation (min): 30 min Charges:  OT General Charges $OT Visit: 1 Visit OT Evaluation $OT Eval Moderate Complexity: 1 Mod Nestor Lewandowsky, OTR/L Acute Rehabilitation Services Pager: [phone] Office: [phone] Malka So 09/24/2019, 11:15 AM

## 2019-09-24 NOTE — Plan of Care (Signed)
  Problem: Education: Goal: Knowledge of General Education information will improve Description: Including pain rating scale, medication(s)/side effects and non-pharmacologic comfort measures Outcome: Progressing   Problem: Health Behavior/Discharge Planning: Goal: Ability to manage health-related needs will improve Outcome: Progressing   Problem: Clinical Measurements: Goal: Ability to maintain clinical measurements within normal limits will improve Outcome: Progressing Goal: Will remain free from infection Outcome: Progressing Goal: Diagnostic test results will improve Outcome: Progressing Goal: Respiratory complications will improve Outcome: Progressing Goal: Cardiovascular complication will be avoided Outcome: Progressing   Problem: Activity: Goal: Risk for activity intolerance will decrease Outcome: Progressing   Problem: Nutrition: Goal: Adequate nutrition will be maintained Outcome: Progressing   Problem: Coping: Goal: Level of anxiety will decrease Outcome: Progressing   Problem: Elimination: Goal: Will not experience complications related to bowel motility Outcome: Progressing   Problem: Pain Managment: Goal: General experience of comfort will improve Outcome: Progressing   Problem: Safety: Goal: Ability to remain free from injury will improve Outcome: Progressing   Problem: Skin Integrity: Goal: Risk for impaired skin integrity will decrease Outcome: Progressing   Problem: Education: Goal: Knowledge of disease or condition will improve Outcome: Progressing Goal: Knowledge of secondary prevention will improve Outcome: Progressing Goal: Knowledge of patient specific risk factors addressed and post discharge goals established will improve Outcome: Progressing Goal: Individualized Educational Video(s) Outcome: Progressing   Problem: Coping: Goal: Will verbalize positive feelings about self Outcome: Progressing Goal: Will identify appropriate support  needs Outcome: Progressing   Problem: Health Behavior/Discharge Planning: Goal: Ability to manage health-related needs will improve Outcome: Progressing   Problem: Self-Care: Goal: Ability to participate in self-care as condition permits will improve Outcome: Progressing Goal: Verbalization of feelings and concerns over difficulty with self-care will improve Outcome: Progressing Goal: Ability to communicate needs accurately will improve Outcome: Progressing   Problem: Nutrition: Goal: Risk of aspiration will decrease Outcome: Progressing Goal: Dietary intake will improve Outcome: Progressing   Problem: Ischemic Stroke/TIA Tissue Perfusion: Goal: Complications of ischemic stroke/TIA will be minimized Outcome: Progressing   Problem: Education: Goal: Individualized Educational Video(s) Outcome: Progressing   Problem: Health Behavior/Discharge Planning: Goal: Ability to manage health-related needs will improve Outcome: Progressing   Problem: Intracerebral Hemorrhage Tissue Perfusion: Goal: Complications of Intracerebral Hemorrhage will be minimized Outcome: Progressing   Problem: Spontaneous Subarachnoid Hemorrhage Tissue Perfusion: Goal: Complications of Spontaneous Subarachnoid Hemorrhage will be minimized Outcome: Progressing

## 2019-09-24 NOTE — Progress Notes (Signed)
LTM discontinued; pt had significant breakdown at [facility]. Also had minor breakdown at [facility]. Notified nurse.

## 2019-09-24 NOTE — Evaluation (Signed)
Clinical/Bedside Swallow Evaluation Patient Details  Name: KARI LATHER MRN: [id number] Date of Birth: 1961-05-09  Today's Date: 09/24/2019 Time: SLP Start Time (ACUTE ONLY): 1050 SLP Stop Time (ACUTE ONLY): 1113 SLP Time Calculation (min) (ACUTE ONLY): 23 min  Past Medical History:  Past Medical History:  Diagnosis Date  . Alcohol dependence ([place]) 1979   stated abusing ETOH at age 58   . Back pain   . Benzodiazepine dependence ([place])   . Cardiac arrest ([place])   . Chronic back pain   . Cocaine abuse ([place])   . COPD (chronic obstructive pulmonary disease) ([place])   . Diabetes mellitus without complication ([place]) [id number]  . Hypertension 2008  . MI (myocardial infarction) ([place])   . Non-compliance   . Schizoaffective disorder ([place]) 2006   . Substance abuse [facility])    Past Surgical History:  Past Surgical History:  Procedure Laterality Date  . CYST EXCISION     HPI:  58 year old man with history of polysubstance use, paranoid schizophrenia, COPD, DM, HTN, CAD presented from jail with acute encephalopathy. Arrested 2 days ago for intoxication and disruptive behavior. Pt with ethylene glycol toxicity, multiple suicide attempts, and labs consistent with metabolic acidosis and acute kidney injury. Intubated 11/26 - 09/23/2019. CXR = Progressive consolidation RIGHT lower lobe suspicious for pneumonia.  + Acute L MCA/ACA stroke   Assessment / Plan / Recommendation Clinical Impression  Oral care was completed with suction. Blood tinged swab noted, due to bleeding inside the left upper cheek (RN informed). Pt does not verbalize at this time, and was unable to follow commands consistently. Following oral care, pt accepted individual ice chips. Immediate cough response and wet voice quality elicited after the swallow. Pt presents with high aspiration risk at this time, given poor tolerance of ice chips, inability to follow commands consistently, and level of O2 requirement. NPO status is recommended at this  time, with consideration of non oral feeding method. SLP will continue to follow at bedside to assess readiness for po intake and/or instrumental assessment    SLP Visit Diagnosis: Dysphagia, unspecified (R13.10)    Aspiration Risk  Severe aspiration risk;Risk for inadequate nutrition/hydration    Diet Recommendation NPO;Alternative means - temporary   Medication Administration: Via alternative means    Other  Recommendations Oral Care Recommendations: Oral care QID Other Recommendations: Have oral suction available   Follow up Recommendations Other (comment)(TBD)      Frequency and Duration min 2x/week  1 week;2 weeks       Prognosis Prognosis for Safe Diet Advancement: Good      Swallow Study   General Date of Onset: 08/13/19 HPI: 58 year old man with history of polysubstance use, paranoid schizophrenia, COPD, DM, HTN, CAD presented from jail with acute encephalopathy. Arrested 2 days ago for intoxication and disruptive behavior. Pt with ethylene glycol toxicity, multiple suicide attempts, and labs consistent with metabolic acidosis and acute kidney injury. Intubated 11/26 - 09/23/2019. CXR = Progressive consolidation RIGHT lower lobe suspicious for pneumonia.  + Acute L MCA/ACA stroke Type of Study: Bedside Swallow Evaluation Previous Swallow Assessment: none Diet Prior to this Study: NPO Temperature Spikes Noted: No Respiratory Status: (aerosol mask) History of Recent Intubation: Yes Length of Intubations (days): 10 days Date extubated: 09/23/19 Behavior/Cognition: Alert;Cooperative;Doesn't follow directions;Requires cueing Oral Cavity Assessment: Other (comment)(bleeding noted on left upper cheek inside oral cavity. RN informed) Oral Care Completed by SLP: Yes Oral Cavity - Dentition: Adequate natural dentition Self-Feeding Abilities: Total assist Patient Positioning: Upright in bed Baseline  Vocal Quality: Aphonic Volitional Cough: Cognitively unable to  elicit Volitional Swallow: Unable to elicit    Oral/Motor/Sensory Function Overall Oral Motor/Sensory Function: Other (comment)(unable to assess - pt not following commands consistently. Nonverbal)   Ice Chips Ice chips: Impaired Presentation: Spoon Oral Phase Impairments: Reduced lingual movement/coordination Oral Phase Functional Implications: Prolonged oral transit Pharyngeal Phase Impairments: Cough - Immediate   Thin Liquid Thin Liquid: Not tested    Nectar Thick Nectar Thick Liquid: Not tested   Honey Thick Honey Thick Liquid: Not tested   Puree Puree: Not tested   Solid     Solid: Not tested     Enriqueta Shutter, [facility], [place] Pathologist Office: [phone] Pager: [phone]  Shonna Chock 09/24/2019,12:23 PM

## 2019-09-24 NOTE — Evaluation (Signed)
Physical Therapy Evaluation Patient Details Name: [name] MRN: [id number] DOB: 01/22/1961 Today's Date: 09/24/2019   History of Present Illness  Pt admitted from jail with rt sided weakness and seizure. 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 Pt with recurrent seizures. Pt intubated 11/25-12/6. PMH - polysubstance use, paranoid schizophrenia, COPD, DM, HTN, CAD   Clinical Impression  Pt admitted with above diagnosis and presents to PT with functional limitations due to deficits listed below (See PT problem list). Pt needs skilled PT to maximize independence and safety to allow discharge to SNF for further rehab.      Follow Up Recommendations SNF    Equipment Recommendations  Other (comment)(to be determined at next venue)    Recommendations for Other Services       Precautions / Restrictions Precautions Precautions: Fall;Other (comment) Precaution Comments: seizures Restrictions Weight Bearing Restrictions: No      Mobility  Bed Mobility Overal bed mobility: Needs Assistance Bed Mobility: Supine to Sit;Sit to Supine     Supine to sit: +2 for physical assistance;Total assist Sit to supine: +2 for physical assistance;Total assist   General bed mobility comments: assist for all aspects  Transfers                 General transfer comment: Attempted to stand but unable. Pt not initiating any movement on attempts.  Ambulation/Gait                Stairs            Wheelchair Mobility    Modified Rankin (Stroke Patients Only) Modified Rankin (Stroke Patients Only) Pre-Morbid Rankin Score: No symptoms Modified Rankin: Severe disability     Balance Overall balance assessment: Needs assistance Sitting-balance support: Single extremity supported Sitting balance-Leahy Scale: Poor Sitting balance - Comments: Sat EOB x 10 minutes with min guard and LUE support.                                      Pertinent Vitals/Pain Pain Assessment: Faces Faces Pain Scale: Hurts a little bit Pain Location: generalized with supine to sit Pain Descriptors / Indicators: Moaning Pain Intervention(s): Monitored during session;Repositioned    Home Living Family/patient expects to be discharged to:: Unsure                      Prior Function Level of Independence: Independent         Comments: Assume pt lived alone and was independent, he is unable to report.     Hand Dominance        Extremity/Trunk Assessment   Upper Extremity Assessment Upper Extremity Assessment: Defer to OT evaluation RUE Deficits / Details: flaccid RUE Coordination: decreased fine motor;decreased gross motor LUE Deficits / Details: generalized weakness LUE Coordination: decreased fine motor;decreased gross motor    Lower Extremity Assessment Lower Extremity Assessment: RLE deficits/detail;LLE deficits/detail;Difficult to assess due to impaired cognition RLE Deficits / Details: Slight movement in toes noted but no movement to command.  LLE Deficits / Details: Movement noted but not following commands    Cervical / Trunk Assessment Cervical / Trunk Assessment: Other exceptions Cervical / Trunk Exceptions: maintains head turned to L, grimacing with gentle PROM toward R, able to turn head actively to midline with maximum verbal cues  Communication   Communication: Expressive difficulties(non verbal, nodded head inconsistently)  Cognition Arousal/Alertness: Awake/alert;Lethargic(alert  once seated at EOB) Behavior During Therapy: Flat affect Overall Cognitive Status: Difficult to assess                                 General Comments: pt following commands inconsistently and with increased time, nodding head slightly to answer questions inconsistently. Rt neglect      General Comments General comments (skin integrity, edema, etc.): VSS. Pt on ventimask    Exercises      Assessment/Plan    PT Assessment Patient needs continued PT services  PT Problem List Decreased strength;Decreased activity tolerance;Decreased balance;Decreased mobility;Decreased cognition;Decreased knowledge of use of DME;Obesity       PT Treatment Interventions DME instruction;Gait training;Stair training;Functional mobility training;Therapeutic activities;Balance training;Patient/family education;Neuromuscular re-education;Cognitive remediation;Therapeutic exercise    PT Goals (Current goals can be found in the Care Plan section)  Acute Rehab PT Goals Patient Stated Goal: Pt unable to state PT Goal Formulation: Patient unable to participate in goal setting Time For Goal Achievement: 10/08/19 Potential to Achieve Goals: Fair    Frequency Min 3X/week   Barriers to discharge Decreased caregiver support appears to live alone    Co-evaluation   Reason for Co-Treatment: Complexity of the patient's impairments (multi-system involvement);For patient/therapist safety   OT goals addressed during session: Strengthening/ROM       AM-PAC PT "6 Clicks" Mobility  Outcome Measure Help needed turning from your back to your side while in a flat bed without using bedrails?: Total Help needed moving from lying on your back to sitting on the side of a flat bed without using bedrails?: Total Help needed moving to and from a bed to a chair (including a wheelchair)?: Total Help needed standing up from a chair using your arms (e.g., wheelchair or bedside chair)?: Total Help needed to walk in hospital room?: Total Help needed climbing 3-5 steps with a railing? : Total 6 Click Score: 6    End of Session Equipment Utilized During Treatment: Gait belt;Oxygen Activity Tolerance: Patient limited by fatigue Patient left: in bed;with call bell/phone within reach;with bed alarm set   PT Visit Diagnosis: Other abnormalities of gait and mobility (R26.89);Other symptoms and signs involving the  nervous system (R29.898);Hemiplegia and hemiparesis Hemiplegia - Right/Left: Right Hemiplegia - dominant/non-dominant: Dominant Hemiplegia - caused by: Cerebral infarction    Time: 1008(1008)-1045 PT Time Calculation (min) (ACUTE ONLY): 37 min   Charges:   PT Evaluation $PT Eval Moderate Complexity: [place] Pager [phone] Office [place] 09/24/2019, 2:19 PM

## 2019-09-24 NOTE — Procedures (Addendum)
Patient Name:[name] [id number] Epilepsy Attending:Brenly Trawick Barbra Sarks Referring Physician/Provider:Dr. Areatha Keas Duration:09/23/2019 1239 to 12/7/20201017  Patient history:58 year old male who presented with right-sided weakness, left gaze deviation and seizure. EEG evaluate for seizures.  Level of alertness:Awake, asleep  AEDs during EEG study:Keppra, PHT  Technical aspects: This EEG study was done with scalp electrodes positioned according to the 10-20 International system of electrode placement. Electrical activity was acquired at a sampling rate of 500Hz  and reviewed with a high frequency filter of 70Hz  and a low frequency filter of 1Hz . EEG data were recorded continuously and digitally stored.  Description: During awake state, no clear posterior dominant rhythm was seen.Sleep was characterized by sleep spindles (12-14Hz ), maximal frontocentral.EEG showed continuous generalized and lateralized left hemisphere 3 to 5 Hz theta-delta slowing. Hyperventilation photic stimulation not performed.  Abnormality -Continuous slow, generalized and lateralized left hemisphere  IMPRESSION: This studyshowed evidence ofcortical dysfunction in left hemisphere likely secondary to underlying abnormality as well asmoderate diffuse encephalopathy, nonspecific etiology.  EEG appears improved compared to previous study.    Hardy Harcum Barbra Sarks

## 2019-09-24 NOTE — Progress Notes (Addendum)
NAME:  [name], MRN:  [id number], DOB:  May 01, 1961, LOS: 12 ADMISSION DATE:  09/12/2019, CONSULTATION DATE: 11/25 REFERRING MD:  Garner Nash, DO CHIEF COMPLAINT: Toxic ingestion, altered mental status  Brief History   [name] is a 58 year old gentleman with a history of alcohol abuse disorder, schizophrenia, diabetes who presented from jail for drunk and disorderly conduct and presented to [facility] emergency department on 11/25.  In jail he was found to be obtunded and tachypneic and was therefore brought to the ED. given his history of ethylene glycol toxicity, multiple suicide attempts, and labs consistent with metabolic acidosis and acute kidney injury, he was treated empirically as ethylene glycol toxicity.  He was intubated in the [facility] on admission and is now [name] ICU.  Over the course of his hospital stay he was passing his SBT's, but was unable to be extubated for mental status.  His urine and serum testing for ethylene glycol was a send out lab at [facility], and was negative.  His urine was positive only for ketones.  Fomepizole was stopped after 48 hours, and once his acid-base disorder had resolved. Imaging + Acute L MCA/ACA stroke. Patient has remained persistently encephalopathic requiring mechanical ventilation.   Consults:  11/26: Nephrology, off 11/29: neurology  Micro Data:  11/25, Covid negative 11/25 BCx> no growth  11/26 sputum culture rare gram-positive cocci 11/26 MRSA PCR negative 11/26 tracheal aspirate> normal flora  12/4 BAL : normal flora 12/5 respiratory > E. Coli R to ampicillin, amp/sulbactam, intermediate to cefazolin, zosyn  Significant Diagnostic Tests:  11/25 CT H> no acute intracranial abnormality 11/28 CT H> L frontal hypodensity 11/29 MRI/A h/n>L hemispheric abnormal diffusion restriction. MRA without large vessel occlusion or stenosis however exam limited by motion degradation  11/29 ECHO> LVEF 60-65%, no septal defect 11/30 CT angio  head/neck> Progressive patchy hypodensity L frontal lobe, high-grade R ICA stenosis, Stenosis vs subtotal occlusion of non-dominant proximal R vertebral artery, no large vessel occlusion  12/1 CXR> Low lung volumes, no focal ASD  EEG 12/2>>> This study is suggestive of cortical dysfunction in left hemisphere likely secondary to underlying abnormality.  In addition there is evidence of moderate diffuse encephalopathy, nonspecific etiology. No seizures or epileptiform discharges were seen throughout the recording. EEG 12/4: L hemisphere sz  Antimicrobials:  S/p metronidazole x1 Received 48 hours empiric vancomycin and cefepime Ceftriaxone 12/7 >  Interim history/subjective:  No acute events overnight. Continues to tolerate extubation. No enteral access   Objective   Blood pressure (!) 144/93, pulse (!) 106, temperature 99 F (37.2 C), temperature source Oral, resp. rate (!) 21, height 6' (1.829 m), weight 102.1 kg, SpO2 96 %.    FiO2 (%):  [28 %-60 %] 28 %   Intake/Output Summary (Last 24 hours) at 09/24/2019 1143 Last data filed at 09/24/2019 0915 Gross per 24 hour  Intake 1352.18 ml  Output 2650 ml  Net -1297.82 ml   Filed Weights   09/22/19 0452 09/23/19 0400 09/24/19 0600  Weight: 105.3 kg 104.3 kg 102.1 kg    Examination:  General:  Chronically ill appearing male resting in bed.  HENT:  NCAT ETT, mm moist, drooling Lungs: Clear bilateral breath sounds, mild tachypnea  Cardiac: RRR, no MRG Abdomen: obese, soft, non tender, nondistended Extremities: No acute deformities. Trace edema Neuro: Awake, alert, unable to follow commands. Moving RUE spontaneously.   Assessment & Plan:   Acute ischemic stroke  -L frontal infarct; multiple punctate areas of restriction diffusion vs  embolic infarcts -High-grade R ICA stenosis, severe stenosis vs subtotal occlusion of non-dominant proximal R vertebral artery  -Secondary stroke work up: LDL 69, hgbA1c: 81, TTE: No PFO.  + Lower  extremity DVT  P: -Management per stoke service.  -ASA/Statin. Heparin drip for LE DVT  New onset tonic-clonic seizure  -EEG without seizure; but with general and focal dysfunction-- L sided dysfunction and attenuation, R sided dysfunction, likely in setting of known stroke. EEG off 12/7. P -Neurology following -started on phenytoin 12/4; repeat level 12/8 -Keppra 1500 mg BID   Acute hypoxemic respiratory failure, improving. Poor airway protection. Extubated 12/8 Possible pneumonia P: -Extubated yesterday and tolerating  -Trach aspirate grew E. Coli, start ceftriaxone, is sensitive.   Toxic metabolic encephalopathy -ethylene glycol neg at [facility] -acetone level mildly elevated  P - Thiamine - Delirium prevention measures  Hypertension -Home meds: Imdur 30 mg daily, lisinopril 10 mg daily, metoprolol 25 mg BID P: - imdur - cont increase metoprolol dosing for bp and hr control - will add PRN IV metoprolol until enteral access obtained.   AKI P - was improving but had a slight bump overnight - Continue supportive care and monitor I/O - uop increased yesterday (>2L output) - Diurese  Hypernatremia:  - free water once enteral access obtained.  - he is 7L positive, will try to diurese.   Hyperkalemia:  -resolved  DM2 -persistent hyperglycemia P -Resistant SSI + Scheduled insulin 7 units q 4 hours -Levemir to 40 mg BID  Hx EtOH abuse P -thiamine, folate   LLE DVT P -IV heparin per pharmacy   Thrombocytopenia, mild -stable  P: -No acute intereventions  Schizoaffective disorder -Home meds: Hydroxyzine and Risperdone (newly prescribed)  MDD -h/o multiple suicide attempts P  -holding home meds in setting of encephalopathy.  Will need psych eval once neurological exam improved  LUE blister P - QD and PRN clean with warm soapy water, cover with thin layer of bacitracin and xeroform  - LLE 2 cm pressure sore healing, sponge/gauze  Best practice:  Diet: TF,  Cor trak ordered Pain/Anxiety/Delirium protocol (if indicated): NA VAP protocol (if indicated): Yes DVT prophylaxis: heparin gtt GI prophylaxis: PPI Glucose control: SSI resistant, BID Levimir, EN coverage  Mobility: bed rest Code Status: full code Family Communication:  Will attempt to contact: Berenice Primas: Niece [phone]. Cared for by ACT team in the outpatient setting.  Disposition: ICU   Labs   CBC: Recent Labs  Lab 09/20/19 0414 09/21/19 0421 09/22/19 0927 09/23/19 0447 09/24/19 0422  WBC 8.9 8.2 9.7 8.9 10.2  NEUTROABS  --   --  7.3 7.0 8.1*  HGB 10.7* 10.3* 9.7* 9.2* 9.9*  HCT 33.7* 33.0* 30.9* 29.7* 31.5*  MCV 97.1 97.3 98.1 98.0 98.1  PLT 162 195 209 222 388    Basic Metabolic Panel: Recent Labs  Lab 09/18/19 0345  09/20/19 0414 09/21/19 0421 09/22/19 0441 09/23/19 0447 09/24/19 0422  NA 141   < > 144 146* 146* 148* 152*  K 4.9   < > 5.2* 5.0 4.8 4.4 4.3  CL 109   < > 112* 113* 114* 113* 117*  CO2 24   < > _0 GLUCOSE 284*   < > 302* 293* 195* 194* 86  BUN 40*   < > 46* 46* 48* 49* 41*  CREATININE 1.10   < > 1.30* 1.42* 1.39* 1.38* 1.37*  CALCIUM 8.6*   < > 9.0 9.1 9.1 9.0 9.4  MG  2.0  --   --   --   --   --   --   PHOS 3.2   < > 4.4 4.8* 5.2* 4.5 5.5*   < > = values in this interval not displayed.   GFR: Estimated Creatinine Clearance: 71.8 mL/min (A) (by C-G formula based on SCr of 1.37 mg/dL (H)). Recent Labs  Lab 09/21/19 0421 09/22/19 0927 09/23/19 0447 09/24/19 0422  WBC 8.2 9.7 8.9 10.2    Liver Function Tests: Recent Labs  Lab 09/18/19 0345  09/20/19 0414 09/21/19 0421 09/22/19 0441 09/23/19 0447 09/24/19 0422  AST 28  --   --   --   --   --   --   ALT 38  --   --   --   --   --   --   ALKPHOS 55  --   --   --   --   --   --   BILITOT 0.4  --   --   --   --   --   --   PROT 5.1*  --   --   --   --   --   --   ALBUMIN 2.3*   < > 2.3* 2.3* 2.2* 2.1* 2.1*   < > = values in this interval not displayed.   No  results for input(s): LIPASE, AMYLASE in the last 168 hours. No results for input(s): AMMONIA in the last 168 hours.  ABG    Component Value Date/Time   PHART 7.377 09/13/2019 0559   PCO2ART 32.5 09/13/2019 0559   PO2ART 118 (H) 09/13/2019 0559   HCO3 18.4 (L) 09/13/2019 0559   TCO2 19 (L) 05/30/2019 0535   ACIDBASEDEF 5.6 (H) 09/13/2019 0559   O2SAT 98.0 09/13/2019 0559     Coagulation Profile: No results for input(s): INR, PROTIME in the last 168 hours.  Cardiac Enzymes: No results for input(s): CKTOTAL, CKMB, CKMBINDEX, TROPONINI in the last 168 hours.  HbA1C: HB A1C (BAYER DCA - WAIVED)  Date/Time Value Ref Range Status  11/11/2016 11:32 AM 7.6 (H) <7.0 % Final    Comment:                                          Diabetic Adult            <7.0                                       Healthy Adult        4.3 - 5.7                                                           (DCCT/NGSP) American Diabetes Association's Summary of Glycemic Recommendations for Adults with Diabetes: Hemoglobin A1c <7.0%. More stringent glycemic goals (A1c <6.0%) may further reduce complications at the cost of increased risk of hypoglycemia.    Hgb A1c MFr Bld  Date/Time Value Ref Range Status  09/13/2019 05:45 AM 8.1 (H) 4.8 - 5.6 % Final    Comment:    (NOTE) Pre diabetes:  5.7%-6.4% Diabetes:              >6.4% Glycemic control for   <7.0% adults with diabetes   05/03/2019 09:40 AM 8.8 (H) 4.8 - 5.6 % Final    Comment:    (NOTE) Pre diabetes:          5.7%-6.4% Diabetes:              >6.4% Glycemic control for   <7.0% adults with diabetes     CBG: Recent Labs  Lab 09/23/19 1942 09/23/19 2003 09/23/19 2354 09/24/19 0339 09/24/19 0820  GLUCAP 69* 120* 36 73 95*     Georgann Housekeeper, AGACNP-BC [place]  See Amion for personal pager PCCM on call pager [phone]  09/24/2019 12:42 PM    PCCM attending:  58 year old, alcohol abuse  history, schizophrenia, diabetes presented from jail after being found with drunk disorderly conduct.  He was obtunded.  There was concern for ethylene glycol toxicity and acidosis he was treated for this urine however was only positive for ketones unfortunately during his hospitalization developed an acute left-sided MCA ACA stroke.  Ultimately ended up intubated due to his encephalopathy.  Patient also developed aspiration pneumonia secondary to E. coli.  Also developed new onset status epilepticus with tonic-clonic seizure.  At this point patient is wake however not very communicative but able to follow some basic commands.  Doing well extubated.  However did fail his swallow study and is plans for core track placement  BP (!) 144/93   Pulse (!) 106   Temp 99 F (37.2 C) (Oral)   Resp (!) 21   Ht 6' (1.829 m)   Wt 102.1 kg   SpO2 96%   BMI 30.53 kg/m   General: Awake alert to voice tracks appropriately will not respond to questions Neck: Trachea midline HEENT: Tracking appropriately Heart: Regular rate rhythm, S1-S2 Lungs: Clear to auscultation bilaterally no crackles no wheeze Extremities: No edema  Labs: Reviewed Chest x-ray: Right lower lobe progressive consolidation concerning for pneumonia.  Assessment: Acute hypoxemic respiratory failure now requiring nasal cannula supplementation Aspiration pneumonia, E. coli, still on antibiotics, chest x-ray reviewed with consolidation of the right lower lobe Failed swallow evaluation, planned core track placement by SLP Acute ischemic stroke Acute onset seizure Acute metabolic toxic encephalopathy Hypertension AKI, improved Hyponatremia, will get better with enteral access once core track is placed History of alcohol use Left lower extremity DVT on heparin Mild thrombocytopenia History of schizophrenia major depression  Plan: Core track placement plan by SLP for tomorrow Continue heparin drip Continue holding home psych meds  These will slowly need to be reintroduced Continue antiepileptics per neurology Aspirin and statin per stroke service Repeat CXR in AM Stable to move from ICU to neuro floor  Needs continued PT/OT   Garner Nash, DO [facility] 09/24/2019 5:24 PM

## 2019-09-24 NOTE — Progress Notes (Signed)
[place] for Heparin Indication: DVT and stroke  Allergies  Allergen Reactions  . Quetiapine Anaphylaxis  . Seroquel [Quetiapine Fumarate] Anxiety and Other (See Comments)    Reaction:  Nightmares   . Trazodone Anaphylaxis  . Trazodone And Nefazodone Anxiety and Other (See Comments)    Other reaction(s): Unknown Nightmares Reaction:  Nightmares   . Vistaril [Hydroxyzine Hcl] Nausea And Vomiting  . Haldol [Haloperidol Lactate] Anxiety and Other (See Comments)    Reaction:  Nightmares     Patient Measurements: Height: 6' (182.9 cm) Weight: 225 lb 1.4 oz (102.1 kg) IBW/kg (Calculated) : 77.6 Heparin Dosing Weight: 97.7 kg  Vital Signs: Temp: 99 F (37.2 C) (12/07 0400) Temp Source: Oral (12/07 0400) BP: 143/83 (12/07 0700) Pulse Rate: 98 (12/07 0700)  Labs: Recent Labs    09/22/19 0441  09/22/19 0927 09/23/19 0447 09/24/19 0422  HGB  --    < > 9.7* 9.2* 9.9*  HCT  --   --  30.9* 29.7* 31.5*  PLT  --   --  209 222 239  HEPARINUNFRC 0.32  --   --  0.33 0.36  CREATININE 1.39*  --   --  1.38* 1.37*   < > = values in this interval not displayed.    Estimated Creatinine Clearance: 71.8 mL/min (A) (by C-G formula based on SCr of 1.37 mg/dL (H)).   Assessment: 58 yr old male presented with acute onset of encephalopathy - found to have seizure-like activity, with CT head showing infarcts in L hemisphere. Also, duplex finding acute DVT in L peroneal, common femoral, proximal profunda, and popliteal veins. No anticoagulation PTA.  Pharmacy consulted to dose heparin.  Heparin level therapeutic at 0.36, no gtt changes, CBC stable.   Goal of Therapy:  Heparin level 0.3-0.5 units/ml Monitor platelets by anticoagulation protocol: Yes   Plan:  Continue heparin gtt at 1050 units/hr Daily heparin level, CBC, s/s bleeding  Bertis Ruddy, PharmD Clinical Pharmacist Please check AMION for all [place] numbers 09/24/2019 8:06  AM

## 2019-09-25 ENCOUNTER — Inpatient Hospital Stay (HOSPITAL_COMMUNITY): Payer: Medicare Other

## 2019-09-25 DIAGNOSIS — R569 Unspecified convulsions: Secondary | ICD-10-CM

## 2019-09-25 LAB — CBC
HCT: 33.3 % — ABNORMAL LOW (ref 39.0–52.0)
Hemoglobin: 10.8 g/dL — ABNORMAL LOW (ref 13.0–17.0)
MCH: 30.7 pg (ref 26.0–34.0)
MCHC: 32.4 g/dL (ref 30.0–36.0)
MCV: 94.6 fL (ref 80.0–100.0)
Platelets: 219 10*3/uL (ref 150–400)
RBC: 3.52 MIL/uL — ABNORMAL LOW (ref 4.22–5.81)
RDW: 13.8 % (ref 11.5–15.5)
WBC: 7.3 10*3/uL (ref 4.0–10.5)
nRBC: 0 % (ref 0.0–0.2)

## 2019-09-25 LAB — RENAL FUNCTION PANEL
Albumin: 2 g/dL — ABNORMAL LOW (ref 3.5–5.0)
Anion gap: 12 (ref 5–15)
BUN: 35 mg/dL — ABNORMAL HIGH (ref 6–20)
CO2: 22 mmol/L (ref 22–32)
Calcium: 9 mg/dL (ref 8.9–10.3)
Chloride: 118 mmol/L — ABNORMAL HIGH (ref 98–111)
Creatinine, Ser: 1.32 mg/dL — ABNORMAL HIGH (ref 0.61–1.24)
GFR calc Af Amer: >60 mL/min (ref >60)
GFR calc non Af Amer: 59 mL/min — ABNORMAL LOW (ref >60)
Glucose, Bld: 106 mg/dL — ABNORMAL HIGH (ref 70–99)
Phosphorus: 5.2 mg/dL — ABNORMAL HIGH (ref 2.5–4.6)
Potassium: 3.9 mmol/L (ref 3.5–5.1)
Sodium: 152 mmol/L — ABNORMAL HIGH (ref 135–145)

## 2019-09-25 LAB — HEPARIN LEVEL (UNFRACTIONATED): Heparin Unfractionated: 0.39 IU/mL (ref 0.30–0.70)

## 2019-09-25 LAB — MAGNESIUM: Magnesium: 2 mg/dL (ref 1.7–2.4)

## 2019-09-25 LAB — GLUCOSE, CAPILLARY
Glucose-Capillary: 82 mg/dL (ref 70–99)
Glucose-Capillary: 92 mg/dL (ref 70–99)
Glucose-Capillary: 174 mg/dL — ABNORMAL HIGH (ref 70–99)
Glucose-Capillary: 173 mg/dL — ABNORMAL HIGH (ref 70–99)
Glucose-Capillary: 168 mg/dL — ABNORMAL HIGH (ref 70–99)
Glucose-Capillary: 209 mg/dL — ABNORMAL HIGH (ref 70–99)

## 2019-09-25 LAB — HEPARIN ANTI-XA: Heparin LMW: 1.06 IU/mL

## 2019-09-25 LAB — PHENYTOIN LEVEL, TOTAL: Phenytoin Lvl: 6.5 ug/mL — ABNORMAL LOW (ref 10.0–20.0)

## 2019-09-25 LAB — PROCALCITONIN: Procalcitonin: 0.13 ng/mL

## 2019-09-25 MED ORDER — ENOXAPARIN SODIUM 100 MG/ML ~~LOC~~ SOLN
100.0000 mg | Freq: Two times a day (BID) | SUBCUTANEOUS | Status: AC
  Administered 2019-09-25: 100 mg via SUBCUTANEOUS
  Filled 2019-09-25: qty 1

## 2019-09-25 MED ORDER — ENOXAPARIN SODIUM 100 MG/ML ~~LOC~~ SOLN
100.0000 mg | Freq: Two times a day (BID) | SUBCUTANEOUS | Status: DC
  Administered 2019-09-26 – 2019-09-27 (×3): 100 mg via SUBCUTANEOUS
  Filled 2019-09-25 (×3): qty 1

## 2019-09-25 MED ORDER — PRO-STAT SUGAR FREE PO LIQD
30.0000 mL | Freq: Every day | ORAL | Status: DC
  Administered 2019-09-26 – 2019-09-27 (×2): 30 mL
  Filled 2019-09-25 (×3): qty 30

## 2019-09-25 MED ORDER — OSMOLITE 1.5 CAL PO LIQD
1000.0000 mL | ORAL | Status: DC
  Administered 2019-09-25 – 2019-09-27 (×2): 1000 mL
  Filled 2019-09-25 (×5): qty 1000

## 2019-09-25 MED ORDER — FREE WATER
100.0000 mL | Freq: Three times a day (TID) | Status: DC
  Administered 2019-09-25 – 2019-09-26 (×3): 100 mL

## 2019-09-25 MED ORDER — SENNOSIDES-DOCUSATE SODIUM 8.6-50 MG PO TABS: 2.0000 | ORAL_TABLET | Freq: Every evening | ORAL | Status: DC | PRN

## 2019-09-25 MED ORDER — POLYETHYLENE GLYCOL 3350 17 G PO PACK: 17.0000 g | PACK | Freq: Every day | ORAL | Status: DC | PRN

## 2019-09-25 MED ORDER — HYDRALAZINE HCL 20 MG/ML IJ SOLN: 10.0000 mg | INTRAMUSCULAR | Status: DC | PRN

## 2019-09-25 NOTE — Progress Notes (Addendum)
STROKE TEAM PROGRESS NOTE   INTERVAL HISTORY Patient is now transferred to floor bed.  He remained stable without any recurrent seizures.[name]    He is sitting up in bed and can follow commands intermittently.  He moves left side purposefully.  Is not speaking and not moving his right side.  Dilantin level remains low at 6.5. OBJECTIVE Vitals:   09/25/19 0724 09/25/19 0900 09/25/19 1058 09/25/19 1153  BP: 119/76     Pulse:  95 (!) 104   Resp: (!) 21 18    Temp: 98.3 F (36.8 C)   98.[name] F (36.8 C)  TempSrc: Oral   Oral  SpO2:  98%    Weight:      Height:        CBC:  Recent Labs  Lab 09/23/19 0447 09/24/19 0422 09/25/19 0305  WBC 8.9 10.[name] 7.3  NEUTROABS 7.0 8.1*  --   HGB 9.[name]* 9.9* 10.8*  HCT 29.7* 31.5* 33.3*  MCV 98.0 98.1 94.6  PLT 222 239 [id number]    Basic Metabolic Panel:  Recent Labs  Lab 09/24/19 0422 09/25/19 0305  NA 152* 152*  K 4.3 3.9  CL 117* 118*  CO2 24 22  GLUCOSE 86 106*  BUN 41* 35*  CREATININE 1.37* 1.32*  CALCIUM 9.4 9.0  MG  --  [name].0  PHOS 5.5* 5.[name]*    Lipid Panel:     Component Value Date/Time   CHOL 122 09/17/2019 0312   CHOL 184 11/11/2016 1007   TRIG 158 (H) 09/17/2019 0312   HDL 21 (L) 09/17/2019 0312   HDL 25 (L) 11/11/2016 1007   CHOLHDL 5.8 09/17/2019 0312   VLDL 32 09/17/2019 0312   LDLCALC 69 09/17/2019 0312   LDLCALC 118 (H) 11/11/2016 1007   HgbA1c:  Lab Results  Component Value Date   HGBA1C 8.1 (H) 09/13/2019   Urine Drug Screen:     Component Value Date/Time   LABOPIA NONE DETECTED 09/12/2019 1326   COCAINSCRNUR NONE DETECTED 09/12/2019 1326   COCAINSCRNUR NONE DETECTED 12/09/2015 2242   LABBENZ NONE DETECTED 09/12/2019 1326   AMPHETMU NONE DETECTED 09/12/2019 1326   THCU NONE DETECTED 09/12/2019 1326   LABBARB NONE DETECTED 09/12/2019 1326    Alcohol Level     Component Value Date/Time   ETH <10 09/12/2019 1326    IMAGING  Ct Angio Head W Or Wo Contrast Ct Angio Neck W Or Wo Contrast 09/17/2019 1.  Negative for large vessel occlusion. Diminutive appearance of the intracranial circulation but no circle-of-Willis branch occlusion or significant intracranial stenosis identified. [name]. Positive for severe stenosis vs. subtotal occlusion of the non-dominant proximal Right Vertebral Artery. This right vertebral terminates in PICA. 3. Positive also for high-grade Right ICA origin stenosis due to bulky calcified plaque, although the degree of this stenosis is numerically underestimated due to diminutive downstream right ICA (3 mm diameter). 4. The left ICA is similarly diminutive, but with no superimposed stenosis despite atherosclerosis. 5. Progressive patchy hypodensity in the left frontal lobe since 09/15/2019 suggests ischemia rather than postictal changes on the MRI yesterday. No associated hemorrhage or mass effect. 6. No new intracranial abnormality.   Dg Chest Port 1 View 09/21/2019 Progressive consolidation RIGHT lower lobe suspicious for pneumonia. 09/18/2019 1. No acute cardiopulmonary disease or significant interval change. [name]. Stable support apparatus. 3. Low lung volumes.    Ct Head Wo Contrast 09/16/2019 IMPRESSION:  1. No acute intracranial hemorrhage.  [name]. Ill-defined areas of hypodensity in the left frontal convexity and  left basal ganglia, new since the prior CT and concerning for acute infarct. Further evaluation with MRI is recommended.   Mr Brain 67 Contrast Mr Angio Head Wo Contrast Mr Angio Neck Wo Contrast 09/16/2019 IMPRESSION:  1. Large amount of abnormal diffusion restriction, likely postictal, within the left hemisphere, predominantly cortical/subcortical.  [name]. Motion degraded MRA of the head and neck. No emergent large vessel occlusion or high-grade stenosis.   Vas [place] Lower Extremity Venous (dvt) 09/16/2019 Summary:  Right: There is no evidence of deep vein thrombosis in the lower extremity. No cystic structure found in the popliteal fossa.  Left: Findings consistent  with acute deep vein thrombosis involving the left peroneal veins, left common femoral vein, left proximal profunda vein, and left popliteal vein. No cystic structure found in the popliteal fossa. Preliminary    Transthoracic Echocardiogram   1. Left ventricular ejection fraction, by visual estimation, is 60 to 65%. The left ventricle has normal function. Left ventricular septal wall thickness was mildly increased. Mildly increased left ventricular posterior wall thickness. There is mildly  increased left ventricular hypertrophy.  [name]. Global right ventricle has normal systolic function.The right ventricular size is normal. No increase in right ventricular wall thickness.  3. Left atrial size was normal.  4. Right atrial size was normal.  5. The mitral valve is normal in structure. Trace mitral valve regurgitation. No evidence of mitral stenosis.  6. The tricuspid valve is normal in structure. Tricuspid valve regurgitation is trivial.  7. The aortic valve is normal in structure. Aortic valve regurgitation is not visualized. No evidence of aortic valve sclerosis or stenosis.  8. The pulmonic valve was normal in structure. Pulmonic valve regurgitation is not visualized.  9. Normal pulmonary artery systolic pressure. 10. The inferior vena cava is normal in size with greater than 50% respiratory variability, suggesting right atrial pressure of 3 mmHg.  ECG - ST rate 122 BPM. (See cardiology reading for complete details)  EEG - This EEG recorded evidence of a generalized nonspecific cerebral dysfunction (encephalopathy) in addition to focal dysfunction.  Though the prominence of slow activity over the right hemisphere would be suggestive of a focal right sided dysfunction, my suspicion is that this represents a generalized dysfunction with attenuation of the frequencies on the left due to his known stroke.  There is also evidence of left-sided dysfunction in the attenuation of the posterior dominant  rhythm on that side. There was no seizure or seizure predisposition recorded on this study.   Overnight EEG w Video 09/21/2019 IMPRESSION: This study showed frequent seizures arising from left hemisphere.  During one of the seizures when event button was pressed on 09/20/2019 at 2237, patient was noted to have forced right gaze deviation.  However other seizures were subclinical/ without any definite clinical manifestations.  Additionally, there was evidence of cortical dysfunction in left hemisphere likely secondary to underlying abnormality as well as moderate diffuse encephalopathy, nonspecific etiology.   PHYSICAL EXAM  Blood pressure 119/76, pulse (!) 104, temperature 98.[name] F (36.8 C), temperature source Oral, resp. rate 18, height 6' (1.829 m), weight 101.[name] kg, SpO2 98 %.  General -obese middle-aged Caucasian male who is not in distress. Ophthalmologic - fundi not visualized due to noncooperation.  Cardiovascular - Regular rhythm    Neuro -patient is awake alert aphasic mute not speaking butcan follow commands intermittently such as squeeze hand, lifts arm with visual cues, moves foot when asked to wiggle toes. Eyes in left gaze preferenceposition, not cross midlin,blinking to visual  threat on the left but not on the right, doll's eyespresent, trackingon the left, PERRL. Corneal reflexpositive on the left, absent on the right, gag and coughpresent. Breathing over the vent. Facial symmetry not able to test due to ET tube. Tongue protrusion not cooperative. On pain stimulation, no movement of RUE and slight withdraw on the BLE. LUE able to hold against gravity briefly on left bicep.DTR 1+ and bilateral positivebabinski. Sensation, coordination and gait not tested.  ASSESSMENT/PLAN [name] is a 58 y.o. 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  MRI head -  Large amount of abnormal diffusion restriction within the left hemisphere, predominantly cortical/subcortical.  MRA H&N - Motion degraded MRA of the head and neck. No emergent large vessel occlusion or high-grade stenosis.    CTA H&N High grade R ICA origin stenosis. Small L ICA.  LE Venous Dopplers - LLE acute DVT  TCD with bubble study - no PFO  2D Echo EF 60-65%  Hold off other embolic stroke work up for now given on anticoagulation for DVT  [name] - negative  LDL - 69  HgbA1c - 8.1  UDS - negative  VTE prophylaxis - Heparin IV   aspirin 81 mg daily prior to admission, now on aspirin 325 and heparin IV per stroke protocol   Therapy recommendations:  pending  Disposition:  Pending  Seizure   11/28 With GTC and left gaze, lasting 30 sec  12/1 with eye flattering and upper left gaze, resolved after 4mg  versed  EEG no seizure, generalized and localized encephalopathy  On keppra bid and Ativan PRN  Recurrent seizure-like activity around 7p 12/1, 4a 12/[name], 7a 12/3  Pt was on Keppra 1500mg  bid -> changed to Dilantin with pharmacy dosing. Dilantin subtherapeutic today -6.5 but will continue current dose of 125 mg every 8 hourly as no recurrent seizures  repeat EEG 12/[name] encephalopathy but no seizure   Now on LTM EEG - 09/21/19 - frequent seizures arising from left hemisphere.  One with forced right gaze deviation ; however, other seizures were  subclinical / without any definite clinical manifestations.  Additionally, there was evidence of cortical dysfunction in left hemisphere likely secondary to underlying abnormality as well as moderate diffuse encephalopathy, nonspecific etiology. EEG improved today no seizures seen, following commands intermittently. LTM EEG discontinued 09/24/2019 LE DVT  LE Venous Dopplers - LLE acute DVT  Unclear etiology   On heparin drip per stroke protocol    CBC monitoring  Acute Hypoxemic Respiratory failure   Intubated off sedation  CCM on board  Not able to extubate  Encephalopathy due to metabolic acidosis, AKI, hypernatremia  Creatinine 3.81-1.55-1.31-1.05-1.10-1.18-1.25-1.30->1.39->1.38  Sodium 152-148-146-133-141-142-145-144->146->148  Off 1/2NS and free water, on LR  Nephrology on board  Hypertension  Home BP meds: Zestril ; Toprol-XL  Current BP meds: metoprolol 12.5 bid     Stable fluctuating . Long-term BP goal  normotensive  Hyperlipidemia  Home Lipid lowering medication: Lipitor 40 mg daily  LDL - 69, at goal < 70  Current lipid lowering medication: Lipitor 40 mg daily   Continue statin at discharge  Diabetes type II, uncontrolled  Home diabetic meds: Insulin; metformin  Current diabetic meds: levemir  HgbA1c 8.1, goal < 7.0  SSI  CBG monitoring  On levimir  Other Stroke Risk Factors  Cigarette smoker - will advise to stop smoking  ETOH use, advised to drink no more than 1 alcoholic beverage per day.  Hx substance abuse  Obesity, Body mass index is 30.26 kg/m., recommend weight loss, diet and exercise as appropriate   Coronary artery disease  Substance Abuse hx - cocaine ; benzodiazepines    Medical non compliance  Other Active Problems  Acute hypoxemic respiratory failure on vent.  Acute toxic metabolic encephalopathy  Schizoaffective d/o  MDD  Hx suicide atempts  Normocytic anemia - Hb - 10.3->9.7->9.[name]  Thrombocytopenia -  resolved - 209->222  Hyperkalemia 5.[name] after supplement  Now 4.8->4.4  LUE blister  Hematuria Hb 10.7, monitor given need for IV heparin for acute DVT  Temp - 99.7 (WBCs - 9.7)->100  Phos - 4.8->5.[name]->4.5  Hospital day # 13 Plan  Continue Keppra and Dilantin but no need to order daily Dilantin and continue on the current dose of 125 mg every 8 hourly.  We will repeat Dilantin level on Thursday.  Mobilize out of bed.  Therapy consults.  Discussed with   Dr. Reesa Chew.  Stroke team will sign off.  Kindly call for questions. Greater than 50% time during this 25-minute visit was spent on counseling and coordination of care about his seizures strokes and discussion with care team Antony Contras, MD    To contact Stroke Continuity provider, please refer to [email or web address] After hours, contact General Neurology

## 2019-09-25 NOTE — Progress Notes (Signed)
MEDICATION RELATED CONSULT NOTE - INITIAL   Pharmacy Consult for Phenytoin Indication: Seizures  Allergies  Allergen Reactions  . Quetiapine Anaphylaxis  . Seroquel [Quetiapine Fumarate] Anxiety and Other (See Comments)    Reaction:  Nightmares   . Trazodone Anaphylaxis  . Trazodone And Nefazodone Anxiety and Other (See Comments)    Other reaction(s): Unknown Nightmares Reaction:  Nightmares   . Vistaril [Hydroxyzine Hcl] Nausea And Vomiting  . Haldol [Haloperidol Lactate] Anxiety and Other (See Comments)    Reaction:  Nightmares     Patient Measurements: Height: 6' (182.9 cm) Weight: 223 lb 1.7 oz (101.2 kg) IBW/kg (Calculated) : 77.6 Adjusted Body Weight: 88kg  Vital Signs: Temp: 98.2 F (36.8 C) (12/08 1153) Temp Source: Oral (12/08 1153) BP: 119/76 (12/08 0724) Pulse Rate: 104 (12/08 1058) Intake/Output from previous day: 12/07 0701 - 12/08 0700 In: 1228.4 [I.V.:421.6; IV Piggyback:806.8] Out: 2900 [Urine:2900] Intake/Output from this shift: Total I/O In: 255.3 [I.V.:155.3; IV Piggyback:100] Out: 650 [Urine:650]  Labs: Recent Labs    09/23/19 0447 09/24/19 0422 09/25/19 0305  WBC 8.9 10.2 7.3  HGB 9.2* 9.9* 10.8*  HCT 29.7* 31.5* 33.3*  PLT 222 239 219  CREATININE 1.38* 1.37* 1.32*  MG  --   --  2.0  PHOS 4.5 5.5* 5.2*  ALBUMIN 2.1* 2.1* 2.0*   Estimated Creatinine Clearance: 74.1 mL/min (A) (by C-G formula based on SCr of 1.32 mg/dL (H)).   Assessment: 36 YOM with seizure like activity this admission, seizure on EEG and fosphenytoin load 1500mg  PE x 1 12/4  Patient on continuous EEG with continued seizures. Last EEG procedure note from Dr. Hortense Ramal on 12/7 reports improved EGG comparted to previous studies.   Corrected phenytoin level today up to 13 (2 days after last dose increase of 75mg /day - this is not steady state. Per Dr. Hortense Ramal, target level is 15 -20.   Plan:  Continue phenytoin to 125 mg q8h Follow-up ordered phenytoin level on 12/10  (This will be 4 days after last dose increase and more indicative of steady state).   Sloan Leiter, PharmD, BCPS, BCCCP Clinical Pharmacist Please refer to [facility] for [place] numbers 09/25/2019 12:11 PM

## 2019-09-25 NOTE — Progress Notes (Addendum)
PROGRESS NOTE    [name]  CWC:[id number] DOB: 1961/04/21 DOA: 09/12/2019 PCP: Celene Squibb, MD   Brief Narrative:  58 year old with history of alcohol abuse, schizophrenia, diabetes mellitus type 2 presented from jail for being drunk and disorderly conduct any pain hospital on 11/25.  Found to be tachypneic and obtunded.  Concerns for metabolic acidosis, AKI and alkaline glycol toxicity therefore he was transferred to [facility].  Fomepizole was initially given which improved his acid-base disorder.  Imaging showed acute left MCA infarct.  Difficult to extubate due to persistent encephalopathy.  Seen by nephrology and neurology.  Found to have left lower extremity DVT therefore started on heparin drip.  Already on aspirin statin.  Concerns for tonic-clonic seizures therefore started on phenytoin and Keppra.   Assessment & Plan:   Active Problems:   Acute renal failure (ARF) (HCC)   Pressure injury of skin   Cerebral embolism with cerebral infarction   Toxic metabolic encephalopathy   Acute metabolic encephalopathy, multifactorial Acute ischemic stroke left frontal with high-grade right ICA stenosis -Still quite confused and mainly stays nonverbal -Seen by neurology team.  A1c 8.1, LDL 69.  TEE EF 65%, negative for PFO.  Lower extremity positive for DVT -Aspirin and statin -We will transition him from heparin drip to subcu Lovenox  Tonic-clonic seizures; stable for now -EEG-cortical dysfunction, moderate encephalopathy on initial EEG.  Repeat EEG 12/4 showed left hemispheric seizures -On phenytoin, Keppra  Acute hypoxic respiratory failure, improved.  Extubated Aspiration pneumonia, E. coli -Tracheal culture positive for E. coli. -IV Rocephin.  Toxic metabolic encephalopathy, intermittent delirium Alcohol use Dysphagia with severe aspiration risk -Thiamine, multivitamin, folate -Cortrk placed. Dietician consulted for tube feedings.   Hypernatremia -We will add  free water flushes via tube.  Discontinue normal saline  Acute kidney injury -Resolved.  Baseline creatinine 1.3.  Admission creatinine 3.8 which is improved with fluids.  Essential hypertension -On metoprolol, Imdur.  Diabetes mellitus type 2, poorly controlled -Insulin sliding scale Accu-Chek.  Levemir 40 units twice daily.  Thrombocytopenia -No obvious signs of bleeding  Schizoaffective disorder -On hydroxyzine and risperidone but currently on hold due to n.p.o. status.  May benefit from psych eval.  Left lower extremity blisters -Routine care with soapy water, layer of bacitracin and Xeroform.  PT recommendations-SNF Speech recommendations-n.p.o. due to severe aspiration risk  Due to ongoing multiple comorbidities, lack of social support- will consult palliative care for their assistance in establishing [place]  DVT prophylaxis: On heparin drip, will transition to Lovenox full dose Code Status: Full code Family Communication: Apparently listed people in the computer does not want to be contacted Disposition Plan: Maintain hospital stay until his mentation has improved  Consultants:   Neurology  Nephrology   Subjective: Seen and examined at bedside, mostly remains nonverbal.  Tracks me across the room.  Core track placed which he tolerated.  Review of Systems Otherwise negative except as per HPI, including: General: Denies fever, chills, night sweats or unintended weight loss. Resp: Denies cough, wheezing, shortness of breath. Cardiac: Denies chest pain, palpitations, orthopnea, paroxysmal nocturnal dyspnea. GI: Denies abdominal pain, nausea, vomiting, diarrhea or constipation GU: Denies dysuria, frequency, hesitancy or incontinence MS: Denies muscle aches, joint pain or swelling Neuro: Denies headache, neurologic deficits (focal weakness, numbness, tingling), abnormal gait Psych: Denies anxiety, depression, SI/HI/AVH Skin: Denies new rashes or lesions ID: Denies  sick contacts, exotic exposures, travel  Objective: Vitals:   09/24/19 2316 09/25/19 0315 09/25/19 0500 09/25/19 0724  BP:  Pulse:      Resp:  20  (!) 21  Temp: 98.2 F (36.8 C) 98.2 F (36.8 C)  98.3 F (36.8 C)  TempSrc: Axillary Oral  Oral  SpO2:      Weight:   101.2 kg   Height:        Intake/Output Summary (Last 24 hours) at 09/25/2019 0731 Last data filed at 09/25/2019 0300 Gross per 24 hour  Intake 1228.39 ml  Output 2900 ml  Net -1671.61 ml   Filed Weights   09/23/19 0400 09/24/19 0600 09/25/19 0500  Weight: 104.3 kg 102.1 kg 101.2 kg    Examination:  General exam: Remains nonverbal, appears chronically ill, feeding tube in place Respiratory system: Bibasilar rhonchi Cardiovascular system: S1 & S2 heard, RRR. No JVD, murmurs, rubs, gallops or clicks. No pedal edema. Gastrointestinal system: Abdomen is nondistended, soft and nontender. No organomegaly or masses felt. Normal bowel sounds heard. Central nervous system: Remains nonverbal.  Otherwise difficult to assess Extremities: Symmetric 4 x 5 power. Skin: No rashes, lesions or ulcers Psychiatry: Judgement and insight appear to be poor  Data Reviewed:   CBC: Recent Labs  Lab 09/21/19 0421 09/22/19 0927 09/23/19 0447 09/24/19 0422 09/25/19 0305  WBC 8.2 9.7 8.9 10.2 7.3  NEUTROABS  --  7.3 7.0 8.1*  --   HGB 10.3* 9.7* 9.2* 9.9* 10.8*  HCT 33.0* 30.9* 29.7* 31.5* 33.3*  MCV 97.3 98.1 98.0 98.1 94.6  PLT 195 209 222 239 497   Basic Metabolic Panel: Recent Labs  Lab 09/21/19 0421 09/22/19 0441 09/23/19 0447 09/24/19 0422 09/25/19 0305  NA 146* 146* 148* 152* 152*  K 5.0 4.8 4.4 4.3 3.9  CL 113* 114* 113* 117* 118*  CO2 _0 GLUCOSE 293* 195* 194* 86 106*  BUN 46* 48* 49* 41* 35*  CREATININE 1.42* 1.39* 1.38* 1.37* 1.32*  CALCIUM 9.1 9.1 9.0 9.4 9.0  MG  --   --   --   --  2.0  PHOS 4.8* 5.2* 4.5 5.5* 5.2*   GFR: Estimated Creatinine Clearance: 74.1 mL/min (A) (by C-G  formula based on SCr of 1.32 mg/dL (H)). Liver Function Tests: Recent Labs  Lab 09/21/19 0421 09/22/19 0441 09/23/19 0447 09/24/19 0422 09/25/19 0305  ALBUMIN 2.3* 2.2* 2.1* 2.1* 2.0*   No results for input(s): LIPASE, AMYLASE in the last 168 hours. No results for input(s): AMMONIA in the last 168 hours. Coagulation Profile: No results for input(s): INR, PROTIME in the last 168 hours. Cardiac Enzymes: No results for input(s): CKTOTAL, CKMB, CKMBINDEX, TROPONINI in the last 168 hours. BNP (last 3 results) No results for input(s): PROBNP in the last 8760 hours. HbA1C: No results for input(s): HGBA1C in the last 72 hours. CBG: Recent Labs  Lab 09/24/19 2010 09/24/19 2108 09/24/19 2314 09/25/19 0318 09/25/19 0727  GLUCAP 101* 97 89 82 92   Lipid Profile: No results for input(s): CHOL, HDL, LDLCALC, TRIG, CHOLHDL, LDLDIRECT in the last 72 hours. Thyroid Function Tests: No results for input(s): TSH, T4TOTAL, FREET4, T3FREE, THYROIDAB in the last 72 hours. Anemia Panel: No results for input(s): VITAMINB12, FOLATE, FERRITIN, TIBC, IRON, RETICCTPCT in the last 72 hours. Sepsis Labs: No results for input(s): PROCALCITON, LATICACIDVEN in the last 168 hours.  Recent Results (from the past 240 hour(s))  Culture, bal-quantitative     Status: Abnormal   Collection Time: 09/21/19  3:39 PM   Specimen: Bronchoalveolar Lavage; Respiratory  Result Value Ref Range Status   Specimen  Description BRONCHIAL ALVEOLAR LAVAGE  Final   Special Requests NONE  Final   Gram Stain   Final    FEW WBC PRESENT, PREDOMINANTLY PMN FEW GRAM POSITIVE COCCI IN PAIRS FEW GRAM NEGATIVE RODS    Culture (A)  Final    >=100,000 COLONIES/mL Consistent with normal respiratory flora. Performed at [facility], [place] [address], [place], [place] [address]    Report Status 09/23/2019 FINAL  Final  Culture, respiratory (non-expectorated)     Status: None   Collection Time: 09/22/19  8:04 AM   Specimen:  Tracheal Aspirate; Respiratory  Result Value Ref Range Status   Specimen Description TRACHEAL ASPIRATE  Final   Special Requests Normal  Final   Gram Stain   Final    RARE WBC PRESENT, PREDOMINANTLY PMN FEW GRAM NEGATIVE RODS RARE GRAM POSITIVE COCCI IN CLUSTERS Performed at [facility], [place] [address] [place], [place] [address]    Culture FEW ESCHERICHIA COLI  Final   Report Status 09/24/2019 FINAL  Final   Organism ID, Bacteria ESCHERICHIA COLI  Final      Susceptibility   Escherichia coli - MIC*    AMPICILLIN >=32 RESISTANT Resistant     CEFAZOLIN 32 INTERMEDIATE Intermediate     CEFEPIME <=1 SENSITIVE Sensitive     CEFTAZIDIME <=1 SENSITIVE Sensitive     CEFTRIAXONE <=1 SENSITIVE Sensitive     CIPROFLOXACIN <=0.25 SENSITIVE Sensitive     GENTAMICIN <=1 SENSITIVE Sensitive     IMIPENEM <=0.25 SENSITIVE Sensitive     TRIMETH/SULFA <=20 SENSITIVE Sensitive     AMPICILLIN/SULBACTAM >=32 RESISTANT Resistant     PIP/TAZO 64 INTERMEDIATE Intermediate     Extended ESBL NEGATIVE Sensitive     * FEW ESCHERICHIA COLI         Radiology Studies: Dg Chest Port 1 View  Result Date: 09/25/2019 CLINICAL DATA:  Right lower lobe pneumonia. EXAM: PORTABLE CHEST 1 VIEW COMPARISON:  Radiograph 09/21/2019 FINDINGS: Interval extubation with slightly lower lung volumes. Streaky atelectasis at the left lung base and perihilar right lung have improved from prior. There is persistent patchy opacity in the right lower lobe. Background interstitial coarsening which appears chronic. Unchanged heart size and mediastinal contours. No pneumothorax or large pleural effusion. IMPRESSION: 1. Persistent patchy opacity in the right lower lobe, suspicious for pneumonia, including aspiration. 2. Interval improvement in streaky atelectasis at the left lung base and perihilar right lung. 3. Slightly lower lung volumes after extubation. Electronically Signed   By: Keith Rake M.D.   On: 09/25/2019 06:19         Scheduled Meds:  aspirin  325 mg Per Tube Daily   atorvastatin  40 mg Per NG tube q1800   bacitracin   Topical Daily   chlorhexidine gluconate (MEDLINE KIT)  15 mL Mouth Rinse BID   Chlorhexidine Gluconate Cloth  6 each Topical Q0600   feeding supplement (PRO-STAT SUGAR FREE 64)  30 mL Per Tube TID   folic acid  1 mg Per Tube Daily   insulin aspart  0-20 Units Subcutaneous Q4H   insulin aspart  7 Units Subcutaneous Q4H   insulin detemir  40 Units Subcutaneous BID   isosorbide mononitrate  30 mg Oral Daily   mouth rinse  15 mL Mouth Rinse 10 times per day   metoprolol tartrate  37.5 mg Per NG tube BID   multivitamin with minerals  1 tablet Per Tube Daily   nystatin  5 mL Per Tube QID  pantoprazole (PROTONIX) IV  40 mg Intravenous Q12H   polyethylene glycol  17 g Per Tube Daily   pyridOXINE  100 mg Intravenous Daily   sodium chloride flush  10-40 mL Intracatheter Q12H   thiamine  100 mg Intravenous Daily   Continuous Infusions:  sodium chloride 20 mL/hr at 09/24/19 2000   cefTRIAXone (ROCEPHIN)  IV Stopped (09/24/19 1329)   feeding supplement (VITAL AF 1.2 CAL) Stopped (09/23/19 2000)   heparin 1,050 Units/hr (09/24/19 2000)   levETIRAcetam 1,500 mg (09/24/19 2221)   phenytoin (DILANTIN) IV 125 mg (09/25/19 0558)     LOS: 13 days   Time spent= 40 mins    Sharnice Bosler Arsenio Loader, MD [facility]  If 7PM-7AM, please contact night-coverage  09/25/2019, 7:31 AM

## 2019-09-25 NOTE — TOC Progression Note (Signed)
Transition of Care [facility]) - Progression Note    Patient Details  Name: [name] MRN: [id number] Date of Birth: 1961-04-29  Transition of Care [facility]) CM/SW Contact  Sharin Mons, RN Phone Number: 09/25/2019, 12:48 PM  Clinical Narrative:    Pt transferred from 103M to 2W on 12/7. Pt admitted on 11/25 with acute encephalopathy, hx of polysubstance use, paranoid schizophrenia, COPD, DM, HTN, CAD. Pt received from jailed for intoxication and disruptive behavior.  Pt currently alert, nonverbal, doesn't follow commands ... NCM attempted to call sister however call was unsuccessful. NCM then called pt's niece [name] to discuss discharge planning.  Berenice Primas (Niece) Alfonzo Beers (Sister)    [phone]     Niece stated pt has been in and out of group homes for years. Prior to  hospitalization her understanding is pt lived in an apartment. States he has been involved with   ACT team and social services.  TOC team following for potential SNF placement  ...  Expected Discharge Plan: Skilled Nursing Facility Barriers to Discharge: Continued Medical Work up  Expected Discharge Plan and Services Expected Discharge Plan: [place]      Social Determinants of Health (SDOH) Interventions    Readmission Risk Interventions No flowsheet data found.

## 2019-09-25 NOTE — Progress Notes (Signed)
  Speech Language Pathology Treatment: Dysphagia  Patient Details Name: [name] MRN: [id number] DOB: Mar 17, 1961 Today's Date: 09/25/2019 Time: [id number] SLP Time Calculation (min) (ACUTE ONLY): 20 min  Assessment / Plan / Recommendation Clinical Impression  Pt seen at bedside for assessment of po readiness. Pt slightly more alert today. Still largely nonverbal, and does not follow verbal commands consistently, even when visual and tactile cues are given. Pt verbalized "Okay", which was significantly dysarthric. Oral care completed with suction, which pt tolerated well. Pt has poor dentition, missing several teeth. Right side orofacial weakness is evident. Following oral care, pt accepted ice chips x2. Extended oral prep noted. Delayed swallow reflex is suspected, with delayed cough response and change in breath sounds. Recommend continuing with strict NPO status. Cortrak placement is pending. SLP will continue to follow to assess readiness for po intake.     HPI HPI: 58 year old man with history of polysubstance use, paranoid schizophrenia, COPD, DM, HTN, CAD presented from jail with acute encephalopathy. Arrested 2 days ago for intoxication and disruptive behavior. Pt with ethylene glycol toxicity, multiple suicide attempts, and labs consistent with metabolic acidosis and acute kidney injury. Intubated 11/26 - 09/23/2019. CXR = Progressive consolidation RIGHT lower lobe suspicious for pneumonia.  + Acute L MCA/ACA stroke      SLP Plan  Continue with current plan of care       Recommendations  Diet recommendations: NPO Medication Administration: Via alternative means                Oral Care Recommendations: Oral care QID Follow up Recommendations: (TBD) SLP Visit Diagnosis: Dysphagia, unspecified (R13.10) Plan: Continue with current plan of care       [place], [facility], [place] Speech Language Pathologist Office: [phone] Pager:  [phone]  Shonna Chock 09/25/2019, 9:20 AM

## 2019-09-25 NOTE — Progress Notes (Signed)
Nutrition Follow-up  INTERVENTION:   TF per Cortrak: -Osmolite 1.5 @ 50 ml/hr -30 ml Prostat daily -Provides 1900 kcals, 90g protein and 914 ml H2O  -Free water of 100 ml every 8 hours per MD  -Multivitamin with minerals daily  NUTRITION DIAGNOSIS:   Inadequate oral intake related to acute illness as evidenced by NPO status.  Continued.  GOAL:   Patient will meet greater than or equal to 90% of their needs  Not meeting currently.  MONITOR:   Labs, Weight trends, TF tolerance, Skin, I & O's  REASON FOR ASSESSMENT:   Consult Enteral/tube feeding initiation and management  ASSESSMENT:   58 yo male admitted with AMS and acute respiratory failure, possible toxic ingestion of ethylene glycol. PMH includes EtOH abuse, schizophrenia, DM, HTN, CAD, COPD  11/25: admitted, intubated 12/6: extubated 12/8: Cortrak tube placed  **RD working remotely**  Patient currently nonverbal. Per SLP note today, pt with delayed swallow reflex with delayed cough response. Recommend continue with strict NPO. Cortrak tube placed this morning. Given severe aspiration risk, will order continuous feeds. May be able to transition to bolus feeds later.  Admission weight: 204 lbs. Current weight: 223 lbs. Per nursing documentation ,pt with mild-moderate edema in BLE and BUEs.   Medications: Folic acid tablet, Multivitamin with minerals daily, IV B-6, IV Thiamine Labs reviewed: CBGs: 92-174 Elevated Na, Phos Mg WNL  Diet Order:   Diet Order            Diet NPO time specified  Diet effective now              EDUCATION NEEDS:   Not appropriate for education at this time  Skin:  Skin Assessment: Skin Integrity Issues: Skin Integrity Issues:: Stage II Stage I: right heel Stage II: right and left buttocks  Last BM:  12/1  Height:   Ht Readings from Last 1 Encounters:  09/16/19 6' (1.829 m)    Weight:   Wt Readings from Last 1 Encounters:  09/25/19 101.2 kg    Ideal  Body Weight:  56.3 kg  BMI:  Body mass index is 30.26 kg/m.  Estimated Nutritional Needs:   Kcal:  1700-1900  Protein:  85-100g  Fluid:  2L/day   Clayton Bibles, MS, RD, LDN Inpatient Clinical Dietitian Pager: [phone] After Hours Pager: [phone]

## 2019-09-25 NOTE — Consult Note (Signed)
   [facility] CM Inpatient Consult   09/25/2019  [name] 07-30-1961 [id number]  Brief note:   Patient is a recent transferred from Medical ICU.  MD  following noted patient with seizure activity noted.  Patient screened for Medicare ACO, extreme high risk scores, and for skilled nursing facility being recommended.   Patient's primary care provider is being researched currently per [facility] roster patient has not been seen by a [facility] provider since 2018 but was followed by [facility] noted which is not in [place].  Will follow up as information becomes available for primary care provider.  [name], RN BSN [place] [facility] [facility] Liaison  [phone] business mobile phone Toll free office [phone]  Fax number: [phone] [email or web address]

## 2019-09-25 NOTE — Progress Notes (Signed)
[place] for  Lovenox Indication: DVT and stroke  Patient Measurements: Height: 6' (182.9 cm) Weight: 223 lb 1.7 oz (101.2 kg) IBW/kg (Calculated) : 77.6 Heparin Dosing Weight: 97.7 kg  Vital Signs: Temp: 98.2 F (36.8 C) (12/08 1153) Temp Source: Oral (12/08 1153) BP: 123/73 (12/08 1641) Pulse Rate: 99 (12/08 1641)  Labs: Recent Labs    09/23/19 0447 09/24/19 0422 09/25/19 0305 09/25/19 1645  HGB 9.2* 9.9* 10.8*  --   HCT 29.7* 31.5* 33.3*  --   PLT 222 239 219  --   HEPARINUNFRC 0.33 0.36 0.39  --   HEPRLOWMOCWT  --   --   --  1.06  CREATININE 1.38* 1.37* 1.32*  --     Estimated Creatinine Clearance: 74.1 mL/min (A) (by C-G formula based on SCr of 1.32 mg/dL (H)).   Assessment: 58 yr old male presented with acute onset of encephalopathy - found to have seizure-like activity, with CT head showing infarcts in L hemisphere on 11/29. Also, duplex finding acute DVT in L peroneal, common femoral, proximal profunda, and popliteal veins. No anticoagulation PTA. Originally on heparin 11/30, transitioned to enoxaparin 12/8.   12/8 Heparin was discontinued at 12PM. Enoxaparin given at 1330 and will adjust times going forward to get patient back to 1000/2200 PM. LMWH level drawn due to obesity and recent CVA. Level was drawn 45 min early (ideally 4 hr after dose) and is slightly above goal. Patient is not at steady state. Given acute DVT and no bleeding, will continue current dose. H/H & plt stable.   Goal of Therapy:  Anti-Xa level 0.6-1 units/ml 4hrs after LMWH dose given Monitor platelets by anticoagulation protocol: Yes   Plan:  Continue enoxaparin 100mg  Q12hr - adjusting timing to get to usual dosing time of 10:00/22:00  Will continue to monitor levels as needed Daily CBC, s/s bleeding  Benetta Spar, PharmD, BCPS, [facility] Clinical Pharmacist  Please check AMION for all [place] phone numbers After 10:00 PM, call [place]

## 2019-09-25 NOTE — Procedures (Signed)
Cortrak  Person Inserting Tube:  Jacklynn Barnacle E, RD Tube Type:  Cortrak - 43 inches Tube Location:  Left nare Initial Placement:  Stomach Secured by: Bridle Technique Used to Measure Tube Placement:  Documented cm marking at nare/ corner of mouth Cortrak Secured At:  70 cm    Cortrak Tube Team Note:  Consult received to place a Cortrak feeding tube.   No x-ray is required. RN may begin using tube.   If the tube becomes dislodged please keep the tube and contact the Cortrak team at [email or web address] ([email or web address]) for replacement.  If after hours and replacement cannot be delayed, place a NG tube and confirm placement with an abdominal x-ray.    Jacklynn Barnacle, MS, RD, LDN Office: [phone] Pager: [phone] After Hours/Weekend Pager: [phone]

## 2019-09-25 NOTE — Progress Notes (Signed)
STROKE TEAM PROGRESS NOTE   INTERVAL HISTORY Patient continues to improve.  He is now extubated.  He is sitting up in bed and can follow commands intermittently.  He moves left side purposefully.  Is not speaking and not moving his right side.  Overnight 24-hour EEG shows no seizure activity just mild generalized slowing with left-sided focal slowing..  OBJECTIVE Vitals:   09/25/19 0724 09/25/19 0900 09/25/19 1058 09/25/19 1153  BP: 119/76     Pulse:  95 (!) 104   Resp: (!) 21 18    Temp: 98.3 F (36.8 C)   98.2 F (36.8 C)  TempSrc: Oral   Oral  SpO2:  98%    Weight:      Height:        CBC:  Recent Labs  Lab 09/23/19 0447 09/24/19 0422 09/25/19 0305  WBC 8.9 10.2 7.3  NEUTROABS 7.0 8.1*  --   HGB 9.2* 9.9* 10.8*  HCT 29.7* 31.5* 33.3*  MCV 98.0 98.1 94.6  PLT 222 239 [id number]    Basic Metabolic Panel:  Recent Labs  Lab 09/24/19 0422 09/25/19 0305  NA 152* 152*  K 4.3 3.9  CL 117* 118*  CO2 24 22  GLUCOSE 86 106*  BUN 41* 35*  CREATININE 1.37* 1.32*  CALCIUM 9.4 9.0  MG  --  2.0  PHOS 5.5* 5.2*    Lipid Panel:     Component Value Date/Time   CHOL 122 09/17/2019 0312   CHOL 184 11/11/2016 1007   TRIG 158 (H) 09/17/2019 0312   HDL 21 (L) 09/17/2019 0312   HDL 25 (L) 11/11/2016 1007   CHOLHDL 5.8 09/17/2019 0312   VLDL 32 09/17/2019 0312   LDLCALC 69 09/17/2019 0312   LDLCALC 118 (H) 11/11/2016 1007   HgbA1c:  Lab Results  Component Value Date   HGBA1C 8.1 (H) 09/13/2019   Urine Drug Screen:     Component Value Date/Time   LABOPIA NONE DETECTED 09/12/2019 1326   COCAINSCRNUR NONE DETECTED 09/12/2019 1326   COCAINSCRNUR NONE DETECTED 12/09/2015 2242   LABBENZ NONE DETECTED 09/12/2019 1326   AMPHETMU NONE DETECTED 09/12/2019 1326   THCU NONE DETECTED 09/12/2019 1326   LABBARB NONE DETECTED 09/12/2019 1326    Alcohol Level     Component Value Date/Time   ETH <10 09/12/2019 1326    IMAGING  Ct Angio Head W Or Wo Contrast Ct Angio Neck W Or  Wo Contrast 09/17/2019 1. Negative for large vessel occlusion. Diminutive appearance of the intracranial circulation but no circle-of-Willis branch occlusion or significant intracranial stenosis identified. 2. Positive for severe stenosis vs. subtotal occlusion of the non-dominant proximal Right Vertebral Artery. This right vertebral terminates in PICA. 3. Positive also for high-grade Right ICA origin stenosis due to bulky calcified plaque, although the degree of this stenosis is numerically underestimated due to diminutive downstream right ICA (3 mm diameter). 4. The left ICA is similarly diminutive, but with no superimposed stenosis despite atherosclerosis. 5. Progressive patchy hypodensity in the left frontal lobe since 09/15/2019 suggests ischemia rather than postictal changes on the MRI yesterday. No associated hemorrhage or mass effect. 6. No new intracranial abnormality.   Dg Chest Port 1 View 09/21/2019 Progressive consolidation RIGHT lower lobe suspicious for pneumonia. 09/18/2019 1. No acute cardiopulmonary disease or significant interval change. 2. Stable support apparatus. 3. Low lung volumes.    Ct Head Wo Contrast 09/16/2019 IMPRESSION:  1. No acute intracranial hemorrhage.  2. Ill-defined areas of hypodensity in the left frontal  convexity and left basal ganglia, new since the prior CT and concerning for acute infarct. Further evaluation with MRI is recommended.   Mr Brain 26 Contrast Mr Angio Head Wo Contrast Mr Angio Neck Wo Contrast 09/16/2019 IMPRESSION:  1. Large amount of abnormal diffusion restriction, likely postictal, within the left hemisphere, predominantly cortical/subcortical.  2. Motion degraded MRA of the head and neck. No emergent large vessel occlusion or high-grade stenosis.   Vas [place] Lower Extremity Venous (dvt) 09/16/2019 Summary:  Right: There is no evidence of deep vein thrombosis in the lower extremity. No cystic structure found in the popliteal fossa.   Left: Findings consistent with acute deep vein thrombosis involving the left peroneal veins, left common femoral vein, left proximal profunda vein, and left popliteal vein. No cystic structure found in the popliteal fossa. Preliminary    Transthoracic Echocardiogram   1. Left ventricular ejection fraction, by visual estimation, is 60 to 65%. The left ventricle has normal function. Left ventricular septal wall thickness was mildly increased. Mildly increased left ventricular posterior wall thickness. There is mildly  increased left ventricular hypertrophy.  2. Global right ventricle has normal systolic function.The right ventricular size is normal. No increase in right ventricular wall thickness.  3. Left atrial size was normal.  4. Right atrial size was normal.  5. The mitral valve is normal in structure. Trace mitral valve regurgitation. No evidence of mitral stenosis.  6. The tricuspid valve is normal in structure. Tricuspid valve regurgitation is trivial.  7. The aortic valve is normal in structure. Aortic valve regurgitation is not visualized. No evidence of aortic valve sclerosis or stenosis.  8. The pulmonic valve was normal in structure. Pulmonic valve regurgitation is not visualized.  9. Normal pulmonary artery systolic pressure. 10. The inferior vena cava is normal in size with greater than 50% respiratory variability, suggesting right atrial pressure of 3 mmHg.  ECG - ST rate 122 BPM. (See cardiology reading for complete details)  EEG - This EEG recorded evidence of a generalized nonspecific cerebral dysfunction (encephalopathy) in addition to focal dysfunction.  Though the prominence of slow activity over the right hemisphere would be suggestive of a focal right sided dysfunction, my suspicion is that this represents a generalized dysfunction with attenuation of the frequencies on the left due to his known stroke.  There is also evidence of left-sided dysfunction in the attenuation of  the posterior dominant rhythm on that side. There was no seizure or seizure predisposition recorded on this study.   Overnight EEG w Video 09/21/2019 IMPRESSION: This study showed frequent seizures arising from left hemisphere.  During one of the seizures when event button was pressed on 09/20/2019 at 2237, patient was noted to have forced right gaze deviation.  However other seizures were subclinical/ without any definite clinical manifestations.  Additionally, there was evidence of cortical dysfunction in left hemisphere likely secondary to underlying abnormality as well as moderate diffuse encephalopathy, nonspecific etiology.   PHYSICAL EXAM  Blood pressure 119/76, pulse (!) 104, temperature 98.2 F (36.8 C), temperature source Oral, resp. rate 18, height 6' (1.829 m), weight 101.2 kg, SpO2 98 %.  General -obese middle-aged Caucasian male who is not in distress. Ophthalmologic - fundi not visualized due to noncooperation.  Cardiovascular - Regular rhythm    Neuro -patient is awake alert aphasic mute not speaking butcan follow commands intermittently such as squeeze hand, lifts arm with visual cues, moves foot when asked to wiggle toes. Eyes in left gaze preferenceposition, not cross midlin,blinking  to visual threat on the left but not on the right, doll's eyespresent, trackingon the left, PERRL. Corneal reflexpositive on the left, absent on the right, gag and coughpresent. Breathing over the vent. Facial symmetry not able to test due to ET tube. Tongue protrusion not cooperative. On pain stimulation, no movement of RUE and slight withdraw on the BLE. LUE able to hold against gravity briefly on left bicep.DTR 1+ and bilateral positivebabinski. Sensation, coordination and gait not tested.  ASSESSMENT/PLAN Mr. FREDERIK HARTSOCK is a 58 y.o. 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  MRI head -  Large amount of abnormal diffusion restriction within the left hemisphere, predominantly cortical/subcortical.  MRA H&N - Motion degraded MRA of the head and neck. No emergent large vessel occlusion or high-grade stenosis.    CTA H&N High grade R ICA origin stenosis. Small L ICA.  LE Venous Dopplers - LLE acute DVT  TCD with bubble study - no PFO  2D Echo EF 60-65%  Hold off other embolic stroke work up for now given on anticoagulation for DVT  [employer] Virus 2 - negative  LDL - 69  HgbA1c - 8.1  UDS - negative  VTE prophylaxis - Heparin IV   aspirin 81 mg daily prior to admission, now on aspirin 325 and heparin IV per stroke protocol   Therapy recommendations:  pending  Disposition:  Pending  Seizure   11/28 With GTC and left gaze, lasting 30 sec  12/1 with eye flattering and upper left gaze, resolved after 4mg  versed  EEG no seizure, generalized and localized encephalopathy  On keppra bid and Ativan PRN  Recurrent seizure-like activity around 7p 12/1, 4a 12/2, 7a 12/3  Pt was on Keppra 1500mg  bid -> changed to Dilantin with pharmacy dosing. Dilantin subtherapeutic today - 8.1 Discussed with Dr Marcelle Overlie - called pharmacy -> they will address.  repeat EEG 12/2 encephalopathy but no seizure   Now on LTM EEG - 09/21/19 - frequent seizures arising from left hemisphere.  One with forced right gaze deviation ; however, other  seizures were subclinical / without any definite clinical manifestations.  Additionally, there was evidence of cortical dysfunction in left hemisphere likely secondary to underlying abnormality as well as moderate diffuse encephalopathy, nonspecific etiology. EEG improved today no seizures seen, following commands intermittently.  LE DVT  LE Venous Dopplers - LLE acute DVT  Unclear etiology   On heparin drip per stroke protocol    CBC monitoring  Acute Hypoxemic Respiratory failure   Intubated off sedation  CCM on board  Not able to extubate  Encephalopathy due to metabolic acidosis, AKI, hypernatremia  Creatinine 3.81-1.55-1.31-1.05-1.10-1.18-1.25-1.30->1.39->1.38  Sodium 152-148-146-133-141-142-145-144->146->148  Off 1/2NS and free water, on LR  Nephrology on board  Hypertension  Home BP meds: Zestril ; Toprol-XL  Current BP meds: metoprolol 12.5 bid     Stable fluctuating . Long-term BP goal normotensive  Hyperlipidemia  Home Lipid lowering medication: Lipitor 40 mg daily  LDL - 69, at goal < 70  Current lipid lowering medication: Lipitor 40 mg daily   Continue statin at discharge  Diabetes type II, uncontrolled  Home diabetic meds: Insulin; metformin  Current diabetic meds: levemir  HgbA1c 8.1, goal < 7.0  SSI  CBG monitoring  On levimir  Other Stroke Risk Factors  Cigarette smoker - will advise to stop smoking  ETOH use, advised to drink no more than 1 alcoholic beverage per day.  Hx substance abuse  Obesity, Body mass index is 30.26 kg/m., recommend weight loss, diet and exercise as appropriate   Coronary artery disease  Substance Abuse hx - cocaine ; benzodiazepines    Medical non compliance  Other Active Problems  Acute hypoxemic respiratory failure on vent.  Acute toxic metabolic encephalopathy  Schizoaffective d/o  MDD  Hx suicide atempts  Normocytic anemia - Hb - 10.3->9.7->9.2  Thrombocytopenia - resolved -  209->222  Hyperkalemia 5.2 after supplement  Now 4.8->4.4  LUE blister  Hematuria Hb 10.7, monitor given need for IV heparin for acute DVT  Temp - 99.7 (WBCs - 9.7)->100  Phos - 4.8->5.2->4.5  Hospital day # 13 Plan discontinue long-term EEG monitoring.  Continue Keppra and Dilantin but no need to order daily Dilantin and continue on the current dose of 125 mg every 8 hourly.  We will repeat Dilantin level on Thursday.  Mobilize out of bed.  Therapy consults.  Discussed with Dr. Hortense Ramal and Dr. Reesa Chew This patient is critically ill and at significant risk of neurological worsening, death and care requires constant monitoring of vital signs, hemodynamics,respiratory and cardiac monitoring,review of multiple databases, neurological assessment, discussion with family, other specialists and medical decision making of high complexity.I  I spent 30 minutes of neurocritical care time in the care of this patient.  Antony Contras, MD    To contact Stroke Continuity provider, please refer to [email or web address] After hours, contact General Neurology

## 2019-09-25 NOTE — Progress Notes (Signed)
[place] for Heparin >> Lovenox Indication: DVT and stroke  Allergies  Allergen Reactions  . Quetiapine Anaphylaxis  . Seroquel [Quetiapine Fumarate] Anxiety and Other (See Comments)    Reaction:  Nightmares   . Trazodone Anaphylaxis  . Trazodone And Nefazodone Anxiety and Other (See Comments)    Other reaction(s): Unknown Nightmares Reaction:  Nightmares   . Vistaril [Hydroxyzine Hcl] Nausea And Vomiting  . Haldol [Haloperidol Lactate] Anxiety and Other (See Comments)    Reaction:  Nightmares     Patient Measurements: Height: 6' (182.9 cm) Weight: 223 lb 1.7 oz (101.2 kg) IBW/kg (Calculated) : 77.6 Heparin Dosing Weight: 97.7 kg  Vital Signs: Temp: 98.3 F (36.8 C) (12/08 0724) Temp Source: Oral (12/08 0724) Pulse Rate: 104 (12/08 1058)  Labs: Recent Labs    09/23/19 0447 09/24/19 0422 09/25/19 0305  HGB 9.2* 9.9* 10.8*  HCT 29.7* 31.5* 33.3*  PLT 222 239 219  HEPARINUNFRC 0.33 0.36 0.39  CREATININE 1.38* 1.37* 1.32*    Estimated Creatinine Clearance: 74.1 mL/min (A) (by C-G formula based on SCr of 1.32 mg/dL (H)).   Assessment: 58 yr old male presented with acute onset of encephalopathy - found to have seizure-like activity, with CT head showing infarcts in L hemisphere on 11/29. Also, duplex finding acute DVT in L peroneal, common femoral, proximal profunda, and popliteal veins. No anticoagulation PTA.  Pharmacy consulted to dose heparin on 11/30. Now consulted to transition to Lovevnox 12/8.   Heparin level therapeutic at 0.39 - discontinuing Heparin at 12PM. Initiating Lovenox 1 hr later at 1300 PM - will adjust times going forward to get patient back to 1000/2200 PM.  Hgb 10.8 - stable. Plts within normal limits. No bleeding reported.    Goal of Therapy:  Anti-Xa level 0.6-1 units/ml 4hrs after LMWH dose given Monitor platelets by anticoagulation protocol: Yes   Plan:  Discontinue IV Heparin at 1200PM.  At 1300PM  (1 hr after stopping I Heparin), start Lovenox 100mg  SQ every 12 hours.  Will check at low molecular weight heparin level 4 hours after dose due to obesity and recent CVA.  Will continue to monitor levels as needed.  Daily CBC, s/s bleeding  Sloan Leiter, PharmD, BCPS, BCCCP Clinical Pharmacist Please refer to [facility] for [place] numbers 09/25/2019 11:38 AM

## 2019-09-26 ENCOUNTER — Inpatient Hospital Stay (HOSPITAL_COMMUNITY): Payer: Medicare Other

## 2019-09-26 DIAGNOSIS — J432 Centrilobular emphysema: Secondary | ICD-10-CM

## 2019-09-26 DIAGNOSIS — E1122 Type 2 diabetes mellitus with diabetic chronic kidney disease: Secondary | ICD-10-CM

## 2019-09-26 DIAGNOSIS — E1142 Type 2 diabetes mellitus with diabetic polyneuropathy: Secondary | ICD-10-CM

## 2019-09-26 DIAGNOSIS — Z794 Long term (current) use of insulin: Secondary | ICD-10-CM

## 2019-09-26 DIAGNOSIS — Z7189 Other specified counseling: Secondary | ICD-10-CM

## 2019-09-26 DIAGNOSIS — Z515 Encounter for palliative care: Secondary | ICD-10-CM

## 2019-09-26 DIAGNOSIS — F1110 Opioid abuse, uncomplicated: Secondary | ICD-10-CM

## 2019-09-26 DIAGNOSIS — F332 Major depressive disorder, recurrent severe without psychotic features: Secondary | ICD-10-CM

## 2019-09-26 LAB — GLUCOSE, CAPILLARY
Glucose-Capillary: 162 mg/dL — ABNORMAL HIGH (ref 70–99)
Glucose-Capillary: 175 mg/dL — ABNORMAL HIGH (ref 70–99)
Glucose-Capillary: 235 mg/dL — ABNORMAL HIGH (ref 70–99)
Glucose-Capillary: 221 mg/dL — ABNORMAL HIGH (ref 70–99)
Glucose-Capillary: 176 mg/dL — ABNORMAL HIGH (ref 70–99)

## 2019-09-26 LAB — RENAL FUNCTION PANEL
Albumin: 2 g/dL — ABNORMAL LOW (ref 3.5–5.0)
Anion gap: 13 (ref 5–15)
BUN: 40 mg/dL — ABNORMAL HIGH (ref 6–20)
CO2: 23 mmol/L (ref 22–32)
Calcium: 9.4 mg/dL (ref 8.9–10.3)
Chloride: 118 mmol/L — ABNORMAL HIGH (ref 98–111)
Creatinine, Ser: 1.45 mg/dL — ABNORMAL HIGH (ref 0.61–1.24)
GFR calc Af Amer: >60 mL/min (ref >60)
GFR calc non Af Amer: 52 mL/min — ABNORMAL LOW (ref >60)
Glucose, Bld: 179 mg/dL — ABNORMAL HIGH (ref 70–99)
Phosphorus: 4.2 mg/dL (ref 2.5–4.6)
Potassium: 3.9 mmol/L (ref 3.5–5.1)
Sodium: 154 mmol/L — ABNORMAL HIGH (ref 135–145)

## 2019-09-26 LAB — CBC
HCT: 33.6 % — ABNORMAL LOW (ref 39.0–52.0)
Hemoglobin: 10.5 g/dL — ABNORMAL LOW (ref 13.0–17.0)
MCH: 30.5 pg (ref 26.0–34.0)
MCHC: 31.3 g/dL (ref 30.0–36.0)
MCV: 97.7 fL (ref 80.0–100.0)
Platelets: 248 10*3/uL (ref 150–400)
RBC: 3.44 MIL/uL — ABNORMAL LOW (ref 4.22–5.81)
RDW: 14 % (ref 11.5–15.5)
WBC: 7.9 10*3/uL (ref 4.0–10.5)
nRBC: 0 % (ref 0.0–0.2)

## 2019-09-26 LAB — BRAIN NATRIURETIC PEPTIDE: B Natriuretic Peptide: 32.4 pg/mL (ref 0.0–100.0)

## 2019-09-26 LAB — PROCALCITONIN: Procalcitonin: 0.18 ng/mL

## 2019-09-26 LAB — MAGNESIUM: Magnesium: 2.1 mg/dL (ref 1.7–2.4)

## 2019-09-26 MED ORDER — FREE WATER
200.0000 mL | Freq: Four times a day (QID) | Status: DC
  Administered 2019-09-26 – 2019-09-27 (×5): 200 mL

## 2019-09-26 MED ORDER — IPRATROPIUM-ALBUTEROL 0.5-2.5 (3) MG/3ML IN SOLN
3.0000 mL | Freq: Four times a day (QID) | RESPIRATORY_TRACT | Status: DC
  Administered 2019-09-26 – 2019-09-27 (×5): 3 mL via RESPIRATORY_TRACT
  Filled 2019-09-26 (×5): qty 3

## 2019-09-26 NOTE — Care Management Important Message (Signed)
Important Message  Patient Details  Name: [name] MRN: [id number] Date of Birth: [birth year]   Medicare Important Message Given:  Yes     [name] 09/26/2019, 9:55 AM

## 2019-09-26 NOTE — Progress Notes (Signed)
PROGRESS NOTE    [name]  XBJ:[id number] DOB: [birth year] DOA: 09/12/2019 PCP: Celene Squibb, MD   Brief Narrative:  58 year old with history of alcohol abuse, schizophrenia, diabetes mellitus type 2 presented from jail for being drunk and disorderly conduct any pain hospital on 11/25.  Found to be tachypneic and obtunded.  Concerns for metabolic acidosis, AKI and alkaline glycol toxicity therefore he was transferred to [facility].  Fomepizole was initially given which improved his acid-base disorder.  Imaging showed acute left MCA infarct.  Difficult to extubate due to persistent encephalopathy.  Seen by nephrology and neurology.  Found to have left lower extremity DVT therefore started on heparin drip.  Already on aspirin statin.  Concerns for tonic-clonic seizures therefore started on phenytoin and Keppra. Cortrak placed on 09/26/19.   Assessment & Plan:   Active Problems:   Diabetes mellitus type 2, controlled ([place])   COPD (chronic obstructive pulmonary disease) (HCC)   Benzodiazepine abuse (HCC)   Opioid abuse (HCC)   Diabetic neuropathy (HCC)   MDD (major depressive disorder), recurrent severe, without psychosis ([place])   Acute renal failure (ARF) ([place])   Pressure injury of skin   Cerebral embolism with cerebral infarction   Toxic metabolic encephalopathy   Seizure (HCC)   Acute metabolic encephalopathy, multifactorial Acute ischemic stroke left frontal with high-grade right ICA stenosis -Mostly remains confused and barely follows any basic commants.  -Seen by neurology team.  A1c 8.1, LDL 69.  TEE EF 65%, negative for PFO.  Lower extremity positive for DVT -Aspirin and statin -SQ Lovenox- DVT tx dose.   Tonic-clonic seizures; stable for now -EEG-cortical dysfunction, moderate encephalopathy on initial EEG.  Repeat EEG 12/4 showed left hemispheric seizures -On phenytoin, Keppra.  Seen by neurology  Acute hypoxic respiratory Distress with diffuse coarse BS  Aspiration pneumonia, E. coli -Tracheal culture positive for E. coli. -IV Rocephin. -Aggressive bronchodilators.  Aspiration precaution -Chest x-ray 12/9-low lung volume without convincing edema or pneumonia.  Toxic metabolic encephalopathy, intermittent delirium Alcohol use Dysphagia with severe aspiration risk -Thiamine, multivitamin, folate -Cortrk placed 12/8.  Tube feedings. -Question if he requires long-term PEG tube, but will consult palliative care  Hypernatremia -Sodium increased to 154.  Will increase free water flushes.  Monitor for any signs of volume overload  Acute kidney injury -Resolved.  Baseline creatinine 1.3.  Stable around 1.4  Essential hypertension -On metoprolol, Imdur.  Diabetes mellitus type 2, poorly controlled -Insulin sliding scale Accu-Chek.  Levemir 40 units twice daily.  Thrombocytopenia -No obvious signs of bleeding  Schizoaffective disorder -On hydroxyzine and risperidone but currently on hold due to n.p.o. status.  May benefit from psych eval.  Left lower extremity blisters -Routine care with soapy water, layer of bacitracin and Xeroform.  PT recommendations-SNF Speech recommendations-n.p.o. due to severe aspiration risk  Due to ongoing multiple comorbidities, lack of social support- will consult palliative care for their assistance in establishing [place]  DVT prophylaxis: Subcu Lovenox Code Status: Full code Family Communication: Called patient's niece, no response. Disposition Plan: Maintain hospital stay until her mentation improves, have palliative care discussion regarding long-term goals of care and feeding.  Currently core track in place, he is NPO.  Unsafe for discharge.  Consultants:   Neurology  Nephrology   Subjective: Seen and examined at bedside, remains mostly nonverbal.  Tracks me across the room.  He is able to lift his left hand when asked to.  Otherwise does not talk much.  Review of Systems Otherwise negative  except as per HPI, including: Difficult to obtain full review of systems Objective: Vitals:   09/25/19 2207 09/25/19 2300 09/26/19 0305 09/26/19 0847  BP: (!) 145/88 (!) 147/84  (!) 143/91  Pulse: (!) 111 88  (!) 107  Resp:  (!) 25  (!) 25  Temp:  97.8 F (36.6 C) 97.9 F (36.6 C) 98.5 F (36.9 C)  TempSrc:  Oral Oral Oral  SpO2:  97%  96%  Weight:      Height:        Intake/Output Summary (Last 24 hours) at 09/26/2019 1151 Last data filed at 09/26/2019 1100 Gross per 24 hour  Intake 719.78 ml  Output 2450 ml  Net -1730.22 ml   Filed Weights   09/23/19 0400 09/24/19 0600 09/25/19 0500  Weight: 104.3 kg 102.1 kg 101.2 kg    Examination:  Constitutional: Chronically ill and frail appearing.  Feeding tube in place Respiratory: Diffuse coarse breath sounds Cardiovascular: Normal sinus rhythm, no rubs Abdomen: Nontender nondistended good bowel sounds Musculoskeletal: No edema noted Skin: No rashes seen Neurologic: Difficult to get for neuro exam.  Follows very basic commands.  Able to squeeze my hand when asked any movements. Psychiatric: Poor judgment and insight.  Barely responds to his name's. External catheter in place PICC line in place  Data Reviewed:   CBC: Recent Labs  Lab 09/22/19 0927 09/23/19 0447 09/24/19 0422 09/25/19 0305 09/26/19 0507  WBC 9.7 8.9 10.2 7.3 7.9  NEUTROABS 7.3 7.0 8.1*  --   --   HGB 9.7* 9.2* 9.9* 10.8* 10.5*  HCT 30.9* 29.7* 31.5* 33.3* 33.6*  MCV 98.1 98.0 98.1 94.6 97.7  PLT 209 222 239 219 505   Basic Metabolic Panel: Recent Labs  Lab 09/22/19 0441 09/23/19 0447 09/24/19 0422 09/25/19 0305 09/26/19 0506  NA 146* 148* 152* 152* 154*  K 4.8 4.4 4.3 3.9 3.9  CL 114* 113* 117* 118* 118*  CO2 _0 GLUCOSE 195* 194* 86 106* 179*  BUN 48* 49* 41* 35* 40*  CREATININE 1.39* 1.38* 1.37* 1.32* 1.45*  CALCIUM 9.1 9.0 9.4 9.0 9.4  MG  --   --   --  2.0 2.1  PHOS 5.2* 4.5 5.5* 5.2* 4.2   GFR: Estimated Creatinine  Clearance: 67.5 mL/min (A) (by C-G formula based on SCr of 1.45 mg/dL (H)). Liver Function Tests: Recent Labs  Lab 09/22/19 0441 09/23/19 0447 09/24/19 0422 09/25/19 0305 09/26/19 0506  ALBUMIN 2.2* 2.1* 2.1* 2.0* 2.0*   No results for input(s): LIPASE, AMYLASE in the last 168 hours. No results for input(s): AMMONIA in the last 168 hours. Coagulation Profile: No results for input(s): INR, PROTIME in the last 168 hours. Cardiac Enzymes: No results for input(s): CKTOTAL, CKMB, CKMBINDEX, TROPONINI in the last 168 hours. BNP (last 3 results) No results for input(s): PROBNP in the last 8760 hours. HbA1C: No results for input(s): HGBA1C in the last 72 hours. CBG: Recent Labs  Lab 09/25/19 1643 09/25/19 1928 09/25/19 2305 09/26/19 0313 09/26/19 0844  GLUCAP 173* 168* 209* 162* 175*   Lipid Profile: No results for input(s): CHOL, HDL, LDLCALC, TRIG, CHOLHDL, LDLDIRECT in the last 72 hours. Thyroid Function Tests: No results for input(s): TSH, T4TOTAL, FREET4, T3FREE, THYROIDAB in the last 72 hours. Anemia Panel: No results for input(s): VITAMINB12, FOLATE, FERRITIN, TIBC, IRON, RETICCTPCT in the last 72 hours. Sepsis Labs: Recent Labs  Lab 09/25/19 0941 09/26/19 0506  PROCALCITON 0.13 0.18    Recent Results (from the  past 240 hour(s))  Culture, bal-quantitative     Status: Abnormal   Collection Time: 09/21/19  3:39 PM   Specimen: Bronchoalveolar Lavage; Respiratory  Result Value Ref Range Status   Specimen Description BRONCHIAL ALVEOLAR LAVAGE  Final   Special Requests NONE  Final   Gram Stain   Final    FEW WBC PRESENT, PREDOMINANTLY PMN FEW GRAM POSITIVE COCCI IN PAIRS FEW GRAM NEGATIVE RODS    Culture (A)  Final    >=100,000 COLONIES/mL Consistent with normal respiratory flora. Performed at [facility], [place] [address], [place], [place] [address]    Report Status 09/23/2019 FINAL  Final  Culture, respiratory (non-expectorated)     Status: None    Collection Time: 09/22/19  8:04 AM   Specimen: Tracheal Aspirate; Respiratory  Result Value Ref Range Status   Specimen Description TRACHEAL ASPIRATE  Final   Special Requests Normal  Final   Gram Stain   Final    RARE WBC PRESENT, PREDOMINANTLY PMN FEW GRAM NEGATIVE RODS RARE GRAM POSITIVE COCCI IN CLUSTERS Performed at [facility], [place] [address] [place], [place] [address]    Culture FEW ESCHERICHIA COLI  Final   Report Status 09/24/2019 FINAL  Final   Organism ID, Bacteria ESCHERICHIA COLI  Final      Susceptibility   Escherichia coli - MIC*    AMPICILLIN >=32 RESISTANT Resistant     CEFAZOLIN 32 INTERMEDIATE Intermediate     CEFEPIME <=1 SENSITIVE Sensitive     CEFTAZIDIME <=1 SENSITIVE Sensitive     CEFTRIAXONE <=1 SENSITIVE Sensitive     CIPROFLOXACIN <=0.25 SENSITIVE Sensitive     GENTAMICIN <=1 SENSITIVE Sensitive     IMIPENEM <=0.25 SENSITIVE Sensitive     TRIMETH/SULFA <=20 SENSITIVE Sensitive     AMPICILLIN/SULBACTAM >=32 RESISTANT Resistant     PIP/TAZO 64 INTERMEDIATE Intermediate     Extended ESBL NEGATIVE Sensitive     * FEW ESCHERICHIA COLI         Radiology Studies: Dg Chest Port 1 View  Result Date: 09/26/2019 CLINICAL DATA:  Dyspnea EXAM: PORTABLE CHEST 1 VIEW COMPARISON:  Yesterday FINDINGS: Feeding tube which at least reaches the pylorus. Right PICC with tip at the upper right atrium. Low volume chest with interstitial crowding but no Kerley lines, consolidation, effusion, or pneumothorax. Cardiac enlargement accentuated by low volumes IMPRESSION: Low volume chest without convincing pneumonia or edema. The asymmetric right lower lobe density on prior is not visualized today. Electronically Signed   By: Monte Fantasia M.D.   On: 09/26/2019 10:04   Dg Chest Port 1 View  Result Date: 09/25/2019 CLINICAL DATA:  Right lower lobe pneumonia. EXAM: PORTABLE CHEST 1 VIEW COMPARISON:  Radiograph 09/21/2019 FINDINGS: Interval extubation with slightly lower  lung volumes. Streaky atelectasis at the left lung base and perihilar right lung have improved from prior. There is persistent patchy opacity in the right lower lobe. Background interstitial coarsening which appears chronic. Unchanged heart size and mediastinal contours. No pneumothorax or large pleural effusion. IMPRESSION: 1. Persistent patchy opacity in the right lower lobe, suspicious for pneumonia, including aspiration. 2. Interval improvement in streaky atelectasis at the left lung base and perihilar right lung. 3. Slightly lower lung volumes after extubation. Electronically Signed   By: Keith Rake M.D.   On: 09/25/2019 06:19        Scheduled Meds: . aspirin  325 mg Per Tube Daily  . atorvastatin  40 mg Per NG tube q1800  .  bacitracin   Topical Daily  . chlorhexidine gluconate (MEDLINE KIT)  15 mL Mouth Rinse BID  . Chlorhexidine Gluconate Cloth  6 each Topical Q0600  . enoxaparin (LOVENOX) injection  100 mg Subcutaneous Q12H  . feeding supplement (PRO-STAT SUGAR FREE 64)  30 mL Per Tube Daily  . folic acid  1 mg Per Tube Daily  . free water  200 mL Per Tube Q6H  . insulin aspart  0-20 Units Subcutaneous Q4H  . insulin aspart  7 Units Subcutaneous Q4H  . insulin detemir  40 Units Subcutaneous BID  . ipratropium-albuterol  3 mL Nebulization Q6H  . isosorbide mononitrate  30 mg Oral Daily  . mouth rinse  15 mL Mouth Rinse 10 times per day  . metoprolol tartrate  37.5 mg Per NG tube BID  . multivitamin with minerals  1 tablet Per Tube Daily  . nystatin  5 mL Per Tube QID  . pantoprazole (PROTONIX) IV  40 mg Intravenous Q12H  . polyethylene glycol  17 g Per Tube Daily  . pyridOXINE  100 mg Intravenous Daily  . sodium chloride flush  10-40 mL Intracatheter Q12H  . thiamine  100 mg Intravenous Daily   Continuous Infusions: . cefTRIAXone (ROCEPHIN)  IV 2 g (09/25/19 1139)  . feeding supplement (OSMOLITE 1.5 CAL) 50 mL/hr at 09/26/19 1100  . levETIRAcetam 1,500 mg (09/26/19  1037)  . phenytoin (DILANTIN) IV 125 mg (09/26/19 0813)     LOS: 14 days   Time spent= 40 mins     Arsenio Loader, MD [facility]  If 7PM-7AM, please contact night-coverage  09/26/2019, 11:51 AM

## 2019-09-26 NOTE — Consult Note (Signed)
Consultation Note Date: 09/26/2019   Patient Name: [name]  DOB: 1961/10/15  MRN: [id number]  Age / Sex: 58 y.o., male  PCP: Celene Squibb, MD Referring Physician: Damita Lack, MD  Reason for Consultation: Establishing goals of care  HPI/Patient Profile: 58 y.o. male  with past medical history of plysubstance use, paranoid schizophrenia, COPD, DM, HTN, CAD admitted on 09/12/2019 with acute encephalopathy, suicide attempts, R sided weakness, seizure. Workup reveals ETOH toxicity, metabolic acidosis, acute renal failure with uremia and likely sepsis as well as multiple acute embolic infarcts, and acute DVT in the left lower extremity. EEG on 12/4 showed frequent seizures. He was intubated 11/26-12/6 and has been extubated. Chest xray showing progressive Right lower lobe suspicion for pneumonia. Remains with coretrak feeding tube in place. He is nonverbal. SLP eval notes significant dysphagia- recommendation for NPO status.   Clinical Assessment and Goals of Care: Chart reviewed. Evaluated patient at bedside. He did not nod to any yes or no questions. He did reach out and hold my hand. Did not follow any of my commands.  I attempted to call his sister and niece who are both listed as contacts. There was no answer- and no opportunity to leave message. Will continue to try to reach.   Primary Decision Maker NEXT OF KIN    SUMMARY OF RECOMMENDATIONS -Continue current care -PMT will try again to reach family    Code Status/Advance Care Planning:  Full code   Prognosis:    Unable to determine  Discharge Planning: To Be Determined  Primary Diagnoses: Present on Admission: . Acute renal failure (ARF) ([place]) . Benzodiazepine abuse ([place]) . COPD (chronic obstructive pulmonary disease) ([place]) . Diabetic neuropathy ([place]) . MDD (major depressive disorder), recurrent severe, without psychosis ([place]) .  Opioid abuse ([place])   I have reviewed the medical record, interviewed the patient and family, and examined the patient. The following aspects are pertinent.  Past Medical History:  Diagnosis Date  . Alcohol dependence ([place]) 1979   stated abusing ETOH at age 69   . Back pain   . Benzodiazepine dependence ([place])   . Cardiac arrest ([place])   . Chronic back pain   . Cocaine abuse ([place])   . COPD (chronic obstructive pulmonary disease) ([place])   . Diabetes mellitus without complication ([place]) 8206  . Hypertension 2008  . MI (myocardial infarction) ([place])   . Non-compliance   . Schizoaffective disorder ([place]) 2006   . Substance abuse ([place])    Social History   Socioeconomic History  . Marital status: Single    Spouse name: Not on file  . Number of children: Not on file  . Years of education: Not on file  . Highest education level: Not on file  Occupational History  . Not on file  Social Needs  . Financial resource strain: Not on file  . Food insecurity    Worry: Not on file    Inability: Not on file  . Transportation needs    Medical: Not on  file    Non-medical: Not on file  Tobacco Use  . Smoking status: Current Every Day Smoker    Packs/day: 1.50    Types: Cigarettes  . Smokeless tobacco: Never Used  Substance and Sexual Activity  . Alcohol use: Yes    Alcohol/week: 0.0 standard drinks    Comment: last drank week ago  . Drug use: Not Currently    Types: Cocaine    Comment: Denies any use in past 6 months  . Sexual activity: Never  Lifestyle  . Physical activity    Days per week: Not on file    Minutes per session: Not on file  . Stress: Not on file  Relationships  . Social Herbalist on phone: Not on file    Gets together: Not on file    Attends religious service: Not on file    Active member of club or organization: Not on file    Attends meetings of clubs or organizations: Not on file    Relationship status: Not on file  Other Topics Concern  . Not on  file  Social History Narrative  . Not on file   Family History  Problem Relation Age of Onset  . Heart disease Mother   . Heart disease Father   . Heart disease Maternal Grandmother   . Diabetes Maternal Grandmother    Scheduled Meds: . aspirin  325 mg Per Tube Daily  . atorvastatin  40 mg Per NG tube q1800  . bacitracin   Topical Daily  . chlorhexidine gluconate (MEDLINE KIT)  15 mL Mouth Rinse BID  . Chlorhexidine Gluconate Cloth  6 each Topical Q0600  . enoxaparin (LOVENOX) injection  100 mg Subcutaneous Q12H  . feeding supplement (PRO-STAT SUGAR FREE 64)  30 mL Per Tube Daily  . folic acid  1 mg Per Tube Daily  . free water  200 mL Per Tube Q6H  . insulin aspart  0-20 Units Subcutaneous Q4H  . insulin aspart  7 Units Subcutaneous Q4H  . insulin detemir  40 Units Subcutaneous BID  . ipratropium-albuterol  3 mL Nebulization Q6H  . isosorbide mononitrate  30 mg Oral Daily  . mouth rinse  15 mL Mouth Rinse 10 times per day  . metoprolol tartrate  37.5 mg Per NG tube BID  . multivitamin with minerals  1 tablet Per Tube Daily  . nystatin  5 mL Per Tube QID  . pantoprazole (PROTONIX) IV  40 mg Intravenous Q12H  . polyethylene glycol  17 g Per Tube Daily  . pyridOXINE  100 mg Intravenous Daily  . sodium chloride flush  10-40 mL Intracatheter Q12H  . thiamine  100 mg Intravenous Daily   Continuous Infusions: . cefTRIAXone (ROCEPHIN)  IV 2 g (09/25/19 1139)  . feeding supplement (OSMOLITE 1.5 CAL) 50 mL/hr at 09/26/19 0006  . levETIRAcetam 1,500 mg (09/26/19 1037)  . phenytoin (DILANTIN) IV 125 mg (09/26/19 0813)   PRN Meds:.acetaminophen, bisacodyl, dextrose, docusate, fentaNYL (SUBLIMAZE) injection, hydrALAZINE, LORazepam, metoprolol tartrate, polyethylene glycol, senna-docusate, sodium chloride flush Medications Prior to Admission:  Prior to Admission medications   Medication Sig Start Date End Date Taking? Authorizing Provider  aspirin EC 81 MG tablet Take 81 mg by mouth  daily.   Yes [provider]  atorvastatin (LIPITOR) 40 MG tablet Take 40 mg by mouth daily.   Yes [provider]  baclofen (LIORESAL) 10 MG tablet Take 10 mg by mouth 3 (three) times daily as needed for  muscle spasms.   Yes [provider]  budesonide-formoterol (SYMBICORT) 80-4.5 MCG/ACT inhaler Inhale 2 puffs into the lungs 2 (two) times daily.   Yes [provider]  cyclobenzaprine (FLEXERIL) 10 MG tablet Take 10 mg by mouth 3 (three) times daily as needed for muscle spasms.   Yes [provider]  hydrOXYzine (VISTARIL) 50 MG capsule Take 50 mg by mouth 3 (three) times daily.   Yes [provider]  insulin aspart (NOVOLOG) 100 UNIT/ML injection Inject 1-30 Units into the skin 3 (three) times daily with meals. SLIDING SCALE 02/12/19  Yes [provider]  Insulin Glargine (BASAGLAR KWIKPEN) 100 UNIT/ML SOPN Inject 20 Units into the skin at bedtime.   Yes [provider]  isosorbide mononitrate (IMDUR) 30 MG 24 hr tablet Take 30 mg by mouth daily.   Yes [provider]  LEVEMIR 100 UNIT/ML injection Inject 20 Units into the skin at bedtime.  04/26/19  Yes [provider]  lisinopril (PRINIVIL,ZESTRIL) 10 MG tablet Take 1 tablet by mouth daily. 12/12/17  Yes [provider]  metFORMIN (GLUCOPHAGE-XR) 500 MG 24 hr tablet Take 2 tablets (1,000 mg total) by mouth 2 (two) times daily with a meal. For diabetes management 06/02/19  Yes Aline August, MD  metoprolol succinate (TOPROL-XL) 25 MG 24 hr tablet Take 25 mg by mouth 2 (two) times daily.    Yes [provider]  risperiDONE (RISPERDAL) 2 MG tablet Take 2 mg by mouth at bedtime. [place] ON STARTING ON 09/05/2019   Yes [provider]   Allergies  Allergen Reactions  . Quetiapine Anaphylaxis  . Seroquel [Quetiapine Fumarate] Anxiety and Other (See Comments)    Reaction:  Nightmares   . Trazodone Anaphylaxis  . Trazodone  And Nefazodone Anxiety and Other (See Comments)    Other reaction(s): Unknown Nightmares Reaction:  Nightmares   . Vistaril [Hydroxyzine Hcl] Nausea And Vomiting  . Haldol [Haloperidol Lactate] Anxiety and Other (See Comments)    Reaction:  Nightmares    Review of Systems  Unable to perform ROS   Physical Exam Vitals signs and nursing note reviewed.  Cardiovascular:     Rate and Rhythm: Normal rate.  Pulmonary:     Breath sounds: Rhonchi present.  Neurological:     Mental Status: He is alert.     Comments: Nonverbal, not following commands     Vital Signs: BP (!) 143/91 (BP Location: Left Leg)   Pulse (!) 107   Temp 98.5 F (36.9 C) (Oral)   Resp (!) 25   Ht 6' (1.829 m)   Wt 101.2 kg   SpO2 96%   BMI 30.26 kg/m  Pain Scale: PAINAD   Pain Score: 0-No pain   SpO2: SpO2: 96 % O2 Device:SpO2: 96 % O2 Flow Rate: .O2 Flow Rate (L/min): 4 L/min  IO: Intake/output summary:   Intake/Output Summary (Last 24 hours) at 09/26/2019 1149 Last data filed at 09/26/2019 0300 Gross per 24 hour  Intake 719.78 ml  Output 2450 ml  Net -1730.22 ml    LBM: Last BM Date: 09/14/19 Baseline Weight: Weight: 98.9 kg Most recent weight: Weight: 101.2 kg     Palliative Assessment/Data: PPS: 10%     Thank you for this consult. Palliative medicine will continue to follow and assist as needed.   Time In: 1000 Time Out: 1050 Time Total: 50 mins Greater than 50%  of this time was spent counseling and coordinating care related to the above assessment  and plan.  Signed by: Mariana Kaufman, AGNP-C Palliative Medicine    Please contact Palliative Medicine Team phone at [phone] for questions and concerns.  For individual provider: See Shea Evans

## 2019-09-26 NOTE — Progress Notes (Signed)
Palliative consult received- thank you for this consult.   Chart reviewed.   Attempted to call patient's sister and niece to arrange [place] discussion. There was no answer, and no opportunity to leave a message. Will continue to attempt to reach.   Mariana Kaufman, AGNP-C Palliative Medicine  Please call Palliative Medicine team phone with any questions [phone]. For individual providers please see AMION.

## 2019-09-26 NOTE — Progress Notes (Signed)
Occupational Therapy Treatment Patient Details Name: [name] MRN: [id number] DOB: [birth year] Today's Date: 09/26/2019    History of present illness Pt admitted from jail with rt sided weakness and seizure. 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 Pt with recurrent seizures. Pt intubated 11/25-12/6. PMH - polysubstance use, paranoid schizophrenia, COPD, DM, HTN, CAD    OT comments  Pt seen for skilled co -tx with PT this session. Pt following commands and attempting initiation with increased time this session. Pt unable to track to midline without assistance of head turn and continues to have significant R neglect. Pt needing total +2 assist for bed mobility. Pt would continue to benefit from OT intervention and at this time SNF recommendation remains appropriate at discharge.   Follow Up Recommendations  SNF;Supervision/Assistance - 24 hour    Equipment Recommendations  Other (comment)(defer to next venue of care)       Precautions / Restrictions Precautions Precautions: Fall;Other (comment) Precaution Comments: seizures Restrictions Weight Bearing Restrictions: No       Mobility Bed Mobility Overal bed mobility: Needs Assistance Bed Mobility: Supine to Sit;Sit to Supine     Supine to sit: Total assist;+2 for physical assistance Sit to supine: Total assist;+2 for physical assistance   General bed mobility comments: pt with no initiation of transfer despite max tactile and verbal commands  Transfers    General transfer comment: not attempted for safety    Balance Overall balance assessment: Needs assistance Sitting-balance support: Single extremity supported Sitting balance-Leahy Scale: Poor Sitting balance - Comments: sat EOB x 10 min with support of L UE, pt with noted R lateral lean requiring modA to achieve and maintain midline posture. Pt min guard to maintain sitting EOB balance however mod/maxA to for optimal position          ADL  either performed or assessed with clinical judgement        Vision   Alignment/Gaze Preference: Head tilt;Gaze left Additional Comments: R neglect          Cognition Arousal/Alertness: Awake/alert Behavior During Therapy: Flat affect Overall Cognitive Status: Difficult to assess       General Comments: pt following 1 step commands with L UE 50% of time, pt with significant R sided neglect and inability to cross midline  with eyes or head, pt initiated turning head to the R to voice but stops before midline              General Comments resting HR 110s, increased to 120s upon sitting. RR up to 36 when in sitting    Pertinent Vitals/ Pain       Pain Assessment: Faces Faces Pain Scale: No hurt Pain Location: pt with no withdrawl to pain on R UE or LE     Prior Functioning/Environment              Frequency  Min 2X/week        Progress Toward Goals  OT Goals(current goals can now be found in the care plan section)  Progress towards OT goals: Progressing toward goals  Acute Rehab OT Goals Patient Stated Goal: Pt unable to state OT Goal Formulation: Patient unable to participate in goal setting  Plan Discharge plan remains appropriate    Co-evaluation    PT/OT/SLP Co-Evaluation/Treatment: Yes Reason for Co-Treatment: Complexity of the patient's impairments (multi-system involvement) PT goals addressed during session: Mobility/safety with mobility OT goals addressed during session: Strengthening/ROM      AM-PAC  OT "6 Clicks" Daily Activity     Outcome Measure   Help from another person eating meals?: Total Help from another person taking care of personal grooming?: Total Help from another person toileting, which includes using toliet, bedpan, or urinal?: Total Help from another person bathing (including washing, rinsing, drying)?: Total Help from another person to put on and taking off regular upper body clothing?: Total Help from another person to  put on and taking off regular lower body clothing?: Total 6 Click Score: 6    End of Session Equipment Utilized During Treatment: Oxygen  OT Visit Diagnosis: Other symptoms and signs involving cognitive function;Muscle weakness (generalized) (M62.81);Hemiplegia and hemiparesis Hemiplegia - Right/Left: Right   Activity Tolerance Patient tolerated treatment well   Patient Left in bed;with call bell/phone within reach;with bed alarm set;with restraints reapplied   Nurse Communication Mobility status        Time: [id number] OT Time Calculation (min): 23 min  Charges: OT General Charges $OT Visit: 1 Visit OT Treatments $Therapeutic Activity: 8-22 mins   Gypsy Decant MS, OTR/L 09/26/2019, 3:43 PM

## 2019-09-26 NOTE — Progress Notes (Signed)
Physical Therapy Treatment Patient Details Name: [name] MRN: [id number] DOB: 1961-06-22 Today's Date: 09/26/2019    History of Present Illness Pt admitted from jail with rt sided weakness and seizure. 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 Pt with recurrent seizures. Pt intubated 11/25-12/6. PMH - polysubstance use, paranoid schizophrenia, COPD, DM, HTN, CAD     PT Comments    Pt seen with OT today in attempt to progress OOB mobility. Pt remains to be non-verbal, require totalAX2 for bed mobility, severe R sided neglect, and impaired processing/sequencing. Pt with R lateral lean in sitting and did initiate/follow commands with L UE however not with L LE. Cont to recommend SNF upon d/c. Acute PT to cont to follow.    Follow Up Recommendations  SNF     Equipment Recommendations  (TBD at next venue)    Recommendations for Other Services       Precautions / Restrictions Precautions Precautions: Fall;Other (comment) Precaution Comments: seizures Restrictions Weight Bearing Restrictions: No    Mobility  Bed Mobility Overal bed mobility: Needs Assistance Bed Mobility: Supine to Sit;Sit to Supine     Supine to sit: Total assist;+2 for physical assistance Sit to supine: Total assist;+2 for physical assistance   General bed mobility comments: pt with no initiation of transfer despite max tactile and verbal commands  Transfers                 General transfer comment: unable  Ambulation/Gait             General Gait Details: unable   Stairs             Wheelchair Mobility    Modified Rankin (Stroke Patients Only) Modified Rankin (Stroke Patients Only) Pre-Morbid Rankin Score: No symptoms Modified Rankin: Severe disability     Balance Overall balance assessment: Needs assistance Sitting-balance support: Single extremity supported Sitting balance-Leahy Scale: Poor Sitting balance - Comments: sat EOB x 10 min with  support of L UE, pt with noted R lateral lean requiring modA to achieve and maintain midline posture. Pt min guard to maintain sitting EOB balance however mod/maxA to for optimal position                                    Cognition Arousal/Alertness: Awake/alert Behavior During Therapy: Flat affect Overall Cognitive Status: Difficult to assess                                 General Comments: pt following 1 step commands with L UE 50% of time, pt with significant R sided neglect and inability to cross midline  with eyes or head, pt initiated turning head to the R to voice but stops before midline      Exercises      General Comments General comments (skin integrity, edema, etc.): resting HR 110s, increased to 120s upon sitting. RR up to 36 when in sitting      Pertinent Vitals/Pain Pain Assessment: Faces Faces Pain Scale: No hurt Pain Location: pt with no withdrawl to pain on R UE or LE    Home Living                      Prior Function            PT Goals (  current goals can now be found in the care plan section) Progress towards PT goals: Progressing toward goals    Frequency    Min 3X/week      PT Plan Current plan remains appropriate    Co-evaluation PT/OT/SLP Co-Evaluation/Treatment: Yes Reason for Co-Treatment: Complexity of the patient's impairments (multi-system involvement) PT goals addressed during session: Mobility/safety with mobility        AM-PAC PT "6 Clicks" Mobility   Outcome Measure  Help needed turning from your back to your side while in a flat bed without using bedrails?: Total Help needed moving from lying on your back to sitting on the side of a flat bed without using bedrails?: Total Help needed moving to and from a bed to a chair (including a wheelchair)?: Total Help needed standing up from a chair using your arms (e.g., wheelchair or bedside chair)?: Total Help needed to walk in hospital room?:  Total Help needed climbing 3-5 steps with a railing? : Total 6 Click Score: 6    End of Session Equipment Utilized During Treatment: Gait belt;Oxygen Activity Tolerance: Patient limited by fatigue Patient left: in bed;with call bell/phone within reach;with chair alarm set   PT Visit Diagnosis: Other abnormalities of gait and mobility (R26.89);Other symptoms and signs involving the nervous system (R29.898);Hemiplegia and hemiparesis Hemiplegia - Right/Left: Right Hemiplegia - dominant/non-dominant: Dominant Hemiplegia - caused by: Cerebral infarction     Time: [id number] PT Time Calculation (min) (ACUTE ONLY): 24 min  Charges:  $Neuromuscular Re-education: 8-22 mins                     Kittie Plater, PT, DPT Acute Rehabilitation Services Pager #: [phone] Office #: [phone]    Berline Lopes 09/26/2019, 12:32 PM

## 2019-09-27 ENCOUNTER — Inpatient Hospital Stay (HOSPITAL_COMMUNITY): Payer: Medicare Other

## 2019-09-27 DIAGNOSIS — R0682 Tachypnea, not elsewhere classified: Secondary | ICD-10-CM

## 2019-09-27 DIAGNOSIS — R0600 Dyspnea, unspecified: Secondary | ICD-10-CM

## 2019-09-27 LAB — GLUCOSE, CAPILLARY
Glucose-Capillary: 171 mg/dL — ABNORMAL HIGH (ref 70–99)
Glucose-Capillary: 147 mg/dL — ABNORMAL HIGH (ref 70–99)
Glucose-Capillary: 167 mg/dL — ABNORMAL HIGH (ref 70–99)
Glucose-Capillary: 126 mg/dL — ABNORMAL HIGH (ref 70–99)

## 2019-09-27 LAB — PHENYTOIN LEVEL, TOTAL: Phenytoin Lvl: 4.9 ug/mL — ABNORMAL LOW (ref 10.0–20.0)

## 2019-09-27 LAB — CBC
HCT: 31.8 % — ABNORMAL LOW (ref 39.0–52.0)
Hemoglobin: 9.9 g/dL — ABNORMAL LOW (ref 13.0–17.0)
MCH: 30.7 pg (ref 26.0–34.0)
MCHC: 31.1 g/dL (ref 30.0–36.0)
MCV: 98.8 fL (ref 80.0–100.0)
Platelets: 245 10*3/uL (ref 150–400)
RBC: 3.22 MIL/uL — ABNORMAL LOW (ref 4.22–5.81)
RDW: 14.4 % (ref 11.5–15.5)
WBC: 8.9 10*3/uL (ref 4.0–10.5)
nRBC: 0 % (ref 0.0–0.2)

## 2019-09-27 LAB — CHLORIDE, URINE, RANDOM: Chloride Urine: 65 mmol/L

## 2019-09-27 LAB — RENAL FUNCTION PANEL
Albumin: 2.1 g/dL — ABNORMAL LOW (ref 3.5–5.0)
Anion gap: 13 (ref 5–15)
BUN: 41 mg/dL — ABNORMAL HIGH (ref 6–20)
CO2: 22 mmol/L (ref 22–32)
Calcium: 9.1 mg/dL (ref 8.9–10.3)
Chloride: 120 mmol/L — ABNORMAL HIGH (ref 98–111)
Creatinine, Ser: 1.49 mg/dL — ABNORMAL HIGH (ref 0.61–1.24)
GFR calc Af Amer: 59 mL/min — ABNORMAL LOW (ref >60)
GFR calc non Af Amer: 51 mL/min — ABNORMAL LOW (ref >60)
Glucose, Bld: 181 mg/dL — ABNORMAL HIGH (ref 70–99)
Phosphorus: 4.9 mg/dL — ABNORMAL HIGH (ref 2.5–4.6)
Potassium: 4.3 mmol/L (ref 3.5–5.1)
Sodium: 155 mmol/L — ABNORMAL HIGH (ref 135–145)

## 2019-09-27 LAB — MAGNESIUM: Magnesium: 2.1 mg/dL (ref 1.7–2.4)

## 2019-09-27 LAB — PROCALCITONIN: Procalcitonin: 0.3 ng/mL

## 2019-09-27 LAB — CREATININE, URINE, RANDOM: Creatinine, Urine: 73.87 mg/dL

## 2019-09-27 LAB — NA AND K (SODIUM & POTASSIUM), RAND UR
Potassium Urine: 46 mmol/L
Sodium, Ur: 78 mmol/L

## 2019-09-27 LAB — OSMOLALITY, URINE: Osmolality, Ur: 575 mOsm/kg (ref 300–900)

## 2019-09-27 LAB — OSMOLALITY: Osmolality: 333 mOsm/kg (ref 275–295)

## 2019-09-27 MED ORDER — GLYCOPYRROLATE 1 MG PO TABS: 1.0000 mg | ORAL_TABLET | ORAL | Status: DC | PRN

## 2019-09-27 MED ORDER — INSULIN DETEMIR 100 UNIT/ML ~~LOC~~ SOLN
20.0000 [IU] | Freq: Two times a day (BID) | SUBCUTANEOUS | Status: DC
  Filled 2019-09-27: qty 0.2

## 2019-09-27 MED ORDER — POLYVINYL ALCOHOL 1.4 % OP SOLN
1.0000 [drp] | Freq: Four times a day (QID) | OPHTHALMIC | Status: DC | PRN
  Filled 2019-09-27: qty 15

## 2019-09-27 MED ORDER — SODIUM CHLORIDE 0.9 % IV SOLN
160.0000 mg | Freq: Three times a day (TID) | INTRAVENOUS | Status: DC
  Filled 2019-09-27 (×2): qty 3.2

## 2019-09-27 MED ORDER — MORPHINE SULFATE (PF) 4 MG/ML IV SOLN
4.0000 mg | INTRAVENOUS | Status: DC | PRN
  Administered 2019-09-27 – 2019-09-28 (×7): 4 mg via INTRAVENOUS
  Filled 2019-09-27 (×7): qty 1

## 2019-09-27 MED ORDER — ONDANSETRON HCL 4 MG/2ML IJ SOLN: 4.0000 mg | Freq: Four times a day (QID) | INTRAMUSCULAR | Status: DC | PRN

## 2019-09-27 MED ORDER — FUROSEMIDE 10 MG/ML IJ SOLN
40.0000 mg | Freq: Three times a day (TID) | INTRAMUSCULAR | Status: AC
  Administered 2019-09-27 – 2019-09-28 (×2): 40 mg via INTRAVENOUS
  Filled 2019-09-27 (×2): qty 4

## 2019-09-27 MED ORDER — GLYCOPYRROLATE 0.2 MG/ML IJ SOLN: 0.2000 mg | INTRAMUSCULAR | Status: DC | PRN

## 2019-09-27 MED ORDER — SODIUM CHLORIDE 0.9 % IV SOLN
500.0000 mg | Freq: Once | INTRAVENOUS | Status: AC
  Administered 2019-09-27: 500 mg via INTRAVENOUS
  Filled 2019-09-27: qty 10

## 2019-09-27 MED ORDER — LORAZEPAM 2 MG/ML IJ SOLN
2.0000 mg | INTRAMUSCULAR | Status: DC | PRN
  Administered 2019-09-28: 2 mg via INTRAVENOUS
  Filled 2019-09-27: qty 1

## 2019-09-27 MED ORDER — FUROSEMIDE 10 MG/ML IJ SOLN
40.0000 mg | Freq: Once | INTRAMUSCULAR | Status: AC
  Administered 2019-09-27: 40 mg via INTRAVENOUS
  Filled 2019-09-27: qty 4

## 2019-09-27 MED ORDER — LORAZEPAM 2 MG/ML IJ SOLN
2.0000 mg | Freq: Four times a day (QID) | INTRAMUSCULAR | Status: DC
  Administered 2019-09-27 – 2019-09-28 (×5): 2 mg via INTRAVENOUS
  Filled 2019-09-27 (×5): qty 1

## 2019-09-27 MED ORDER — BIOTENE DRY MOUTH MT LIQD: 15.0000 mL | OROMUCOSAL | Status: DC | PRN

## 2019-09-27 MED ORDER — PIPERACILLIN-TAZOBACTAM 3.375 G IVPB
3.3750 g | Freq: Three times a day (TID) | INTRAVENOUS | Status: DC
  Administered 2019-09-27: 3.375 g via INTRAVENOUS
  Filled 2019-09-27 (×2): qty 50

## 2019-09-27 MED ORDER — GLYCOPYRROLATE 0.2 MG/ML IJ SOLN
0.2000 mg | INTRAMUSCULAR | Status: DC | PRN
  Administered 2019-09-27 – 2019-09-28 (×4): 0.2 mg via INTRAVENOUS
  Filled 2019-09-27 (×4): qty 1

## 2019-09-27 MED ORDER — ONDANSETRON 4 MG PO TBDP: 4.0000 mg | ORAL_TABLET | Freq: Four times a day (QID) | ORAL | Status: DC | PRN

## 2019-09-27 NOTE — Progress Notes (Signed)
  Speech Language Pathology Treatment: Dysphagia  Patient Details Name: [name] MRN: [id number] DOB: 07-31-1961 Today's Date: 09/27/2019 Time: [id number] SLP Time Calculation (min) (ACUTE ONLY): 20 min  Assessment / Plan / Recommendation Clinical Impression  Pt seen at bedside for skilled ST intervention focused on goal for PO readiness. Pt was awake, eyes open. Marked left gaze preference with pooled secretions in oral cavity, left side of the face, and neck/beard. Oral care was completed with suction, with effective removal of thick secretions from back of oral cavity, and removal of loose skin on lips. Pt tolerated oral care, but did not follow directions or vocalize. Pt's face, neck and beard were cleaned with warm cloth, and pt head was repositioned to upright/neutral. Rolled towel was required to maintain this position. Dry washcloth was placed on the left side of the face to absorb oral secretions. Pt would benefit from being shaved, given difficulty managing secretions. RN informed. SLP will continue to follow to assess readiness for po intake. Strict NPO status continues to be recommended.    HPI HPI: 58 year old man with history of polysubstance use, paranoid schizophrenia, COPD, DM, HTN, CAD presented from jail with acute encephalopathy. Arrested 2 days ago for intoxication and disruptive behavior. Pt with ethylene glycol toxicity, multiple suicide attempts, and labs consistent with metabolic acidosis and acute kidney injury. Intubated 11/26 - 09/23/2019. CXR = Progressive consolidation RIGHT lower lobe suspicious for pneumonia.  + Acute L MCA/ACA stroke      SLP Plan  Continue with current plan of care       Recommendations  Diet recommendations: NPO Medication Administration: Via alternative means                Oral Care Recommendations: Oral care QID Follow up Recommendations: Other (comment)(tbd) SLP Visit Diagnosis: Dysphagia, unspecified (R13.10) Plan: Continue  with current plan of care       [place], [facility], [place] Pathologist Office: [phone] Pager: [phone]  Shonna Chock 09/27/2019, 2:18 PM

## 2019-09-27 NOTE — Progress Notes (Signed)
CRITICAL VALUE ALERT  Critical Value:  Osmolality 333  Date & Time Notied:  09/27/2019 1230  Provider Notified: Gerlean Ren MD  Orders Received/Actions taken: pending

## 2019-09-27 NOTE — Progress Notes (Signed)
Care discussed with palliative care, family member Eddie Dibbles.  Explained them the poor prognosis.  All the questions answered.  Patient will be transitioned to comfort care with hospice.

## 2019-09-27 NOTE — Progress Notes (Signed)
Daily Progress Note   Patient Name: [name]       Date: 09/27/2019 DOB: 1960/12/03  Age: 58 y.o. MRN#: [id number] Attending Physician: Damita Lack, MD Primary Care Physician: Celene Squibb, MD Admit Date: 09/12/2019  Reason for Consultation/Follow-up: Establishing goals of care  Subjective: Patient with worsening status. Tachypneic, not opening eyes to my voice or touch. SLP showed no ability to control secretions.  I was able to reach his niece- [name]- she and her Uncle- [name] would be patient's surrogate decision makers. [name] states [name]- patient's sister is disabled with mental illness as well, and not able to make decisions for patient. He does not have a legal guardian.  I discussed [name]'s current state with both [name] and [name]. They both state that [name] has had a very difficult life, they believe that he would not want to continue to live in a permanently debilitated state, would not want to be kept alive artificially with a feeding tube.  I discussed continued aggressive care with them vs transition to comfort, and possible placement at residential Hospice. [name] believes this would be this best plan for him. He would like to contact a few other family members before making final decision.     Review of Systems  Unable to perform ROS: Acuity of condition    Length of Stay: 15  Current Medications: Scheduled Meds:  . aspirin  325 mg Per Tube Daily  . atorvastatin  40 mg Per NG tube q1800  . bacitracin   Topical Daily  . chlorhexidine gluconate (MEDLINE KIT)  15 mL Mouth Rinse BID  . Chlorhexidine Gluconate Cloth  6 each Topical Q0600  . enoxaparin (LOVENOX) injection  100 mg Subcutaneous Q12H  . feeding supplement (PRO-STAT SUGAR FREE 64)  30 mL  Per Tube Daily  . folic acid  1 mg Per Tube Daily  . furosemide  40 mg Intravenous Q8H  . insulin aspart  0-20 Units Subcutaneous Q4H  . insulin detemir  20 Units Subcutaneous BID  . ipratropium-albuterol  3 mL Nebulization Q6H  . isosorbide mononitrate  30 mg Oral Daily  . mouth rinse  15 mL Mouth Rinse 10 times per day  . metoprolol tartrate  37.5 mg Per NG tube BID  . multivitamin with minerals  1 tablet Per  Tube Daily  . nystatin  5 mL Per Tube QID  . pantoprazole (PROTONIX) IV  40 mg Intravenous Q12H  . polyethylene glycol  17 g Per Tube Daily  . pyridOXINE  100 mg Intravenous Daily  . sodium chloride flush  10-40 mL Intracatheter Q12H  . thiamine  100 mg Intravenous Daily    Continuous Infusions: . feeding supplement (OSMOLITE 1.5 CAL) 1,000 mL (09/27/19 1247)  . levETIRAcetam 1,500 mg (09/27/19 1204)  . phenytoin (DILANTIN) IV    . piperacillin-tazobactam (ZOSYN)  IV 3.375 g (09/27/19 1516)    PRN Meds: acetaminophen, bisacodyl, dextrose, docusate, fentaNYL (SUBLIMAZE) injection, hydrALAZINE, LORazepam, metoprolol tartrate, polyethylene glycol, senna-docusate, sodium chloride flush  Physical Exam Vitals and nursing note reviewed.  Constitutional:      Appearance: He is ill-appearing.  Pulmonary:     Breath sounds: Wheezing present.     Comments: Increased RR Neurological:     Comments: nonresponsive to my voice of touch             Vital Signs: BP 118/75   Pulse (!) 108   Temp 98.9 F (37.2 C) (Axillary)   Resp (!) 38   Ht 6' (1.829 m)   Wt 101.2 kg   SpO2 100%   BMI 30.26 kg/m  SpO2: SpO2: 100 % O2 Device: O2 Device: Nasal Cannula O2 Flow Rate: O2 Flow Rate (L/min): 4 L/min  Intake/output summary:   Intake/Output Summary (Last 24 hours) at 09/27/2019 1532 Last data filed at 09/27/2019 1323 Gross per 24 hour  Intake 10 ml  Output 2330 ml  Net -2320 ml   LBM: Last BM Date: 09/24/19 Baseline Weight: Weight: 98.9 kg Most recent weight: Weight:  101.2 kg       Palliative Assessment/Data: PPS: 10%      Patient Active Problem List   Diagnosis Date Noted  . Palliative care by specialist   . Goals of care, counseling/discussion   . Advanced care planning/counseling discussion   . Seizure ([name]) 09/25/2019  . Toxic metabolic encephalopathy   . Cerebral embolism with cerebral infarction 09/16/2019  . Pressure injury of skin 09/13/2019  . Acute renal failure (ARF) ([name]) 09/12/2019  . Poisoning, antifreeze   . Suicide attempt ([name])   . Overdose 04/14/2018  . MDD (major depressive disorder) 11/12/2017  . MDD (major depressive disorder), recurrent severe, without psychosis ([name]) 10/12/2017  . Diabetic neuropathy ([name]) 11/11/2016  . Anxiety and depression 11/11/2016  . Chronic insomnia 01/08/2016  . Alcohol use disorder 12/10/2015  . Benzodiazepine abuse ([name]) 12/10/2015  . Opioid abuse ([name]) 12/10/2015  . Constipation 02/06/2015  . BPH (benign prostatic hyperplasia) 01/16/2015  . Vitamin D deficiency 01/16/2015  . Allergic rhinitis 11/19/2014  . Chronic low back pain 11/07/2014  . Onychomycosis of toenail 11/07/2014  . Tinea pedis 11/07/2014  . Psoriasis 10/14/2014  . HTN (hypertension) 10/14/2014  . Diabetes mellitus type 2, controlled ([place]) 10/14/2014  . COPD (chronic obstructive pulmonary disease) ([name]) 10/14/2014  . Tobacco abuse 10/14/2014  . HLD (hyperlipidemia) 10/14/2014  . Schizoaffective disorder, depressive type ([name]) 10/14/2014    Palliative Care Assessment & Plan   Patient Profile: 58 y.o. male  with past medical history of plysubstance use, paranoid schizophrenia, COPD, DM, HTN, CAD admitted on 09/12/2019 with acute encephalopathy, suicide attempts, R sided weakness, seizure. Workup reveals ETOH toxicity, metabolic acidosis, acute renal failure with uremia and likely sepsis as well as multiple acute embolic infarcts, and acute DVT in the left lower extremity. EEG on 12/4  showed frequent seizures. He was  intubated 11/26-12/6 and has been extubated. Chest xray showing progressive Right lower lobe suspicion for pneumonia. Remains with coretrak feeding tube in place. He is nonverbal. SLP eval notes significant dysphagia- recommendation for NPO status.   Assessment/Recommendations/Plan   Family considering full transition to comfort/Hospice  They would want patient transferred to [facility] in [place]- they are discussing and plan to call me back today with final decision  Addendum- spoke with [name]- all family is in agreement with transition to full comfort, d/c all life prolonging interventions  Comfort measures and medications ordered- d/c keppra at discharge  [facility] consult to request bed at [facility]  DNR  Goals of Care and Additional Recommendations:  Limitations on Scope of Treatment: Full Comfort Care  Code Status:  DNR  Prognosis:   < 2 weeks due to CVA, severe dysphagia, worsening respiratory status- likely aspiration pneumonia- plan made to transition to full comfort and request bed at Hospice   Discharge Planning:  [place] was discussed with Dr. Reesa Chew, patient's family- [name] and [name].  Thank you for allowing the Palliative Medicine Team to assist in the care of this patient.   Time In: 1500 Time Out: 1630 Total Time 90 minutes Prolonged Time Billed yes      Greater than 50%  of this time was spent counseling and coordinating care related to the above assessment and plan.  Mariana Kaufman, AGNP-C Palliative Medicine   Please contact Palliative Medicine Team phone at [phone] for questions and concerns.

## 2019-09-27 NOTE — Progress Notes (Addendum)
[place] NOTE  Pharmacy Consult for Phenytoin Indication: Seizures  Allergies  Allergen Reactions  . Quetiapine Anaphylaxis  . Seroquel [Quetiapine Fumarate] Anxiety and Other (See Comments)    Reaction:  Nightmares   . Trazodone Anaphylaxis  . Trazodone And Nefazodone Anxiety and Other (See Comments)    Other reaction(s): Unknown Nightmares Reaction:  Nightmares   . Vistaril [Hydroxyzine Hcl] Nausea And Vomiting  . Haldol [Haloperidol Lactate] Anxiety and Other (See Comments)    Reaction:  Nightmares     Patient Measurements: Height: 6' (182.9 cm) Weight: 223 lb 1.7 oz (101.2 kg) IBW/kg (Calculated) : 77.6 Adjusted Body Weight: 88kg  Vital Signs: Temp: 100.8 F (38.2 C) (12/10 0822) Temp Source: Axillary (12/10 0822) BP: 122/75 (12/10 0822) Pulse Rate: 109 (12/10 0822) Intake/Output from previous day: 12/09 0701 - 12/10 0700 In: 10 [I.V.:10] Out: 1400 [Urine:1400] Intake/Output from this shift: No intake/output data recorded.  Labs: Recent Labs    09/25/19 0305 09/26/19 0506 09/26/19 0507 09/27/19 0431  WBC 7.3  --  7.9 8.9  HGB 10.8*  --  10.5* 9.9*  HCT 33.3*  --  33.6* 31.8*  PLT 219  --  248 245  CREATININE 1.32* 1.45*  --  1.49*  MG 2.0 2.1  --  2.1  PHOS 5.2* 4.2  --  4.9*  ALBUMIN 2.0* 2.0*  --  2.1*   Estimated Creatinine Clearance: 65.7 mL/min (A) (by C-G formula based on SCr of 1.49 mg/dL (H)).   Assessment: 69 YOM with seizure like activity this admission, seizure on EEG and fosphenytoin load 1500mg  PE x 1 12/4  Patient on continuous EEG with continued seizures.  Phenytoin lvl this am was 4.9, corrects to 7.2 (using albumin of 2.1)  Dr Hortense Ramal requesting to target 15 - 20  Plan:  Give phenytoin 500 mg x 1  Increase maintenance dose to 160 mg q8h Will recheck lvls in about 5 days  Barth Kirks, PharmD, BCPS, BCCCP Clinical Pharmacist [phone]  Please check AMION for all [place] numbers  09/27/2019 8:33 AM

## 2019-09-27 NOTE — Progress Notes (Signed)
PROGRESS NOTE    [name]  UXN:[id number] DOB: May 23, 1961 DOA: 09/12/2019 PCP: Celene Squibb, MD   Brief Narrative:  58 year old with history of alcohol abuse, schizophrenia, diabetes mellitus type 2 presented from jail for being drunk and disorderly conduct any pain hospital on 11/25.  Found to be tachypneic and obtunded.  Concerns for metabolic acidosis, AKI and alkaline glycol toxicity therefore he was transferred to [facility].  Fomepizole was initially given which improved his acid-base disorder.  Imaging showed acute left MCA infarct.  Difficult to extubate due to persistent encephalopathy.  Seen by nephrology and neurology.  Found to have left lower extremity DVT therefore started on heparin drip.  Already on aspirin statin.  Concerns for tonic-clonic seizures therefore started on phenytoin and Keppra. Cortrak placed on 09/26/19.   Assessment & Plan:   Active Problems:   Diabetes mellitus type 2, controlled ([place])   COPD (chronic obstructive pulmonary disease) (HCC)   Benzodiazepine abuse (HCC)   Opioid abuse (HCC)   Diabetic neuropathy (HCC)   MDD (major depressive disorder), recurrent severe, without psychosis ([place])   Acute renal failure (ARF) ([place])   Pressure injury of skin   Cerebral embolism with cerebral infarction   Toxic metabolic encephalopathy   Seizure ([place])   Palliative care by specialist   Goals of care, counseling/discussion   Advanced care planning/counseling discussion   Acute metabolic encephalopathy, multifactorial Acute ischemic stroke left frontal with high-grade right ICA stenosis -Mostly remains confused and barely follows any basic commants.  -Seen by neurology team.  A1c 8.1, LDL 69.  TEE EF 65%, negative for PFO.  Lower extremity positive for DVT -Aspirin and statin -Subcutaneous Lovenox (full dose)  Tonic-clonic seizures; stable for now -EEG-cortical dysfunction, moderate encephalopathy on initial EEG.  Repeat EEG 12/4 showed left  hemispheric seizures -Currently on phenytoin and Keppra.  Phenytoin levels low this morning.  Discussed with pharmacist, he will adjust the level as appropriate with albumin correction.  Acute hypoxic respiratory distress with diffuse coarse breath sounds Aspiration pneumonia, E. Coli Fevers/SIRS -Tracheal cultures are growing E. coli.  Was on Rocephin but now due to fever, will transition to Zosyn to broadly cover for aspiration pneumonia. -Aggressive bronchodilators.  Aspiration precaution -Chest x-ray 12/9-low lung volume without convincing edema or pneumonia.  Toxic metabolic encephalopathy, intermittent delirium Alcohol use Dysphagia with severe aspiration risk -Thiamine, multivitamin, folate -Core track placed on 12/8, currently on tube feedings -Palliative care consulted to discuss with family regarding long-term feeding.  Hypernatremia -Slowly worsening hyponatremia despite of increasing free water flushes.  Will check urine electrolytes, serum awesome and urine awesome. Suspect diabetes insipidus?  Acute kidney injury -Resolved.  Baseline creatinine 1.3.  Stable around 1.4  Essential hypertension -On metoprolol, Imdur.  Diabetes mellitus type 2, poorly controlled -Insulin sliding scale Accu-Chek.  Levemir 40 units twice daily.  Thrombocytopenia -No obvious signs of bleeding  Schizoaffective disorder -On hydroxyzine and risperidone but currently on hold due to n.p.o. status.  May benefit from psych eval.  Left lower extremity blisters -Routine care with soapy water, layer of bacitracin and Xeroform.  Acute urinary retention -We will place Foley catheter may need accurate input and output in the setting of hypernatremia and acute kidney injury.  Physical therapy recommendations-SNF Speech recommendations-n.p.o. due to severe aspiration risk  Goals of care discussion -Palliative care involved to assist with this.  DVT prophylaxis: Subcu Lovenox Code Status: Full  code Family Communication: No antibiotic needs Disposition Plan: Maintain hospital stay, patient remains  acutely ill.  Unsafe for discharge.  Consultants:   Neurology  Nephrology  Palliative care   Subjective: Minimally responsive.  Awake looks around when called by his name but does not participate in full conversation.  RN at bedside during my evaluation.  Review of Systems Otherwise negative except as per HPI, including: Difficult to obtain full review of systems Objective: Vitals:   09/26/19 2235 09/26/19 2305 09/27/19 0055 09/27/19 0245  BP:      Pulse: (!) 109     Resp: (!) 25     Temp:  97.8 F (36.6 C)  97.7 F (36.5 C)  TempSrc:  Oral  Oral  SpO2: 98%  98%   Weight:      Height:        Intake/Output Summary (Last 24 hours) at 09/27/2019 0758 Last data filed at 09/26/2019 2234 Gross per 24 hour  Intake 10 ml  Output 1400 ml  Net -1390 ml   Filed Weights   09/23/19 0400 09/24/19 0600 09/25/19 0500  Weight: 104.3 kg 102.1 kg 101.2 kg    Examination:  Constitutional: Not in acute distress, tracks me across the room.  Feeding tube in place. Respiratory: Diffuse coarse breath sounds heard anteriorly Cardiovascular: Normal sinus rhythm, no rubs Abdomen: Nontender nondistended good bowel sounds Musculoskeletal: No edema noted Skin: No rashes seen Neurologic: Grossly moving all the extremities blood difficult full assessment. Psychiatric: Poor judgment and insight.  Alert to his name but difficult to obtain respiratory assessment  Foley catheter to be placed today.  Up until now has external catheter PICC line in place  Data Reviewed:   CBC: Recent Labs  Lab 09/22/19 0927 09/23/19 0447 09/24/19 0422 09/25/19 0305 09/26/19 0507 09/27/19 0431  WBC 9.7 8.9 10.2 7.3 7.9 8.9  NEUTROABS 7.3 7.0 8.1*  --   --   --   HGB 9.7* 9.2* 9.9* 10.8* 10.5* 9.9*  HCT 30.9* 29.7* 31.5* 33.3* 33.6* 31.8*  MCV 98.1 98.0 98.1 94.6 97.7 98.8  PLT 209 222 239 219  248 078   Basic Metabolic Panel: Recent Labs  Lab 09/23/19 0447 09/24/19 0422 09/25/19 0305 09/26/19 0506 09/27/19 0431  NA 148* 152* 152* 154* 155*  K 4.4 4.3 3.9 3.9 4.3  CL 113* 117* 118* 118* 120*  CO2 _0 GLUCOSE 194* 86 106* 179* 181*  BUN 49* 41* 35* 40* 41*  CREATININE 1.38* 1.37* 1.32* 1.45* 1.49*  CALCIUM 9.0 9.4 9.0 9.4 9.1  MG  --   --  2.0 2.1 2.1  PHOS 4.5 5.5* 5.2* 4.2 4.9*   GFR: Estimated Creatinine Clearance: 65.7 mL/min (A) (by C-G formula based on SCr of 1.49 mg/dL (H)). Liver Function Tests: Recent Labs  Lab 09/23/19 0447 09/24/19 0422 09/25/19 0305 09/26/19 0506 09/27/19 0431  ALBUMIN 2.1* 2.1* 2.0* 2.0* 2.1*   No results for input(s): LIPASE, AMYLASE in the last 168 hours. No results for input(s): AMMONIA in the last 168 hours. Coagulation Profile: No results for input(s): INR, PROTIME in the last 168 hours. Cardiac Enzymes: No results for input(s): CKTOTAL, CKMB, CKMBINDEX, TROPONINI in the last 168 hours. BNP (last 3 results) No results for input(s): PROBNP in the last 8760 hours. HbA1C: No results for input(s): HGBA1C in the last 72 hours. CBG: Recent Labs  Lab 09/26/19 0844 09/26/19 1258 09/26/19 1646 09/26/19 2314 09/27/19 0331  GLUCAP 175* 235* 221* 176* 171*   Lipid Profile: No results for input(s): CHOL, HDL, LDLCALC, TRIG, CHOLHDL, LDLDIRECT in  the last 72 hours. Thyroid Function Tests: No results for input(s): TSH, T4TOTAL, FREET4, T3FREE, THYROIDAB in the last 72 hours. Anemia Panel: No results for input(s): VITAMINB12, FOLATE, FERRITIN, TIBC, IRON, RETICCTPCT in the last 72 hours. Sepsis Labs: Recent Labs  Lab 09/25/19 0941 09/26/19 0506 09/27/19 0431  PROCALCITON 0.13 0.18 0.30    Recent Results (from the past 240 hour(s))  Culture, bal-quantitative     Status: Abnormal   Collection Time: 09/21/19  3:39 PM   Specimen: Bronchoalveolar Lavage; Respiratory  Result Value Ref Range Status   Specimen  Description BRONCHIAL ALVEOLAR LAVAGE  Final   Special Requests NONE  Final   Gram Stain   Final    FEW WBC PRESENT, PREDOMINANTLY PMN FEW GRAM POSITIVE COCCI IN PAIRS FEW GRAM NEGATIVE RODS    Culture (A)  Final    >=100,000 COLONIES/mL Consistent with normal respiratory flora. Performed at [facility], [place] [address], [place], [place] [address]    Report Status 09/23/2019 FINAL  Final  Culture, respiratory (non-expectorated)     Status: None   Collection Time: 09/22/19  8:04 AM   Specimen: Tracheal Aspirate; Respiratory  Result Value Ref Range Status   Specimen Description TRACHEAL ASPIRATE  Final   Special Requests Normal  Final   Gram Stain   Final    RARE WBC PRESENT, PREDOMINANTLY PMN FEW GRAM NEGATIVE RODS RARE GRAM POSITIVE COCCI IN CLUSTERS Performed at [facility], [place] [address], [place], [place] [address]    Culture FEW ESCHERICHIA COLI  Final   Report Status 09/24/2019 FINAL  Final   Organism ID, Bacteria ESCHERICHIA COLI  Final      Susceptibility   Escherichia coli - MIC*    AMPICILLIN >=32 RESISTANT Resistant     CEFAZOLIN 32 INTERMEDIATE Intermediate     CEFEPIME <=1 SENSITIVE Sensitive     CEFTAZIDIME <=1 SENSITIVE Sensitive     CEFTRIAXONE <=1 SENSITIVE Sensitive     CIPROFLOXACIN <=0.25 SENSITIVE Sensitive     GENTAMICIN <=1 SENSITIVE Sensitive     IMIPENEM <=0.25 SENSITIVE Sensitive     TRIMETH/SULFA <=20 SENSITIVE Sensitive     AMPICILLIN/SULBACTAM >=32 RESISTANT Resistant     PIP/TAZO 64 INTERMEDIATE Intermediate     Extended ESBL NEGATIVE Sensitive     * FEW ESCHERICHIA COLI         Radiology Studies: DG CHEST PORT 1 VIEW  Result Date: 09/26/2019 CLINICAL DATA:  Dyspnea EXAM: PORTABLE CHEST 1 VIEW COMPARISON:  Yesterday FINDINGS: Feeding tube which at least reaches the pylorus. Right PICC with tip at the upper right atrium. Low volume chest with interstitial crowding but no Kerley lines, consolidation, effusion, or pneumothorax.  Cardiac enlargement accentuated by low volumes IMPRESSION: Low volume chest without convincing pneumonia or edema. The asymmetric right lower lobe density on prior is not visualized today. Electronically Signed   By: Monte Fantasia M.D.   On: 09/26/2019 10:04        Scheduled Meds:  aspirin  325 mg Per Tube Daily   atorvastatin  40 mg Per NG tube q1800   bacitracin   Topical Daily   chlorhexidine gluconate (MEDLINE KIT)  15 mL Mouth Rinse BID   Chlorhexidine Gluconate Cloth  6 each Topical Q0600   enoxaparin (LOVENOX) injection  100 mg Subcutaneous Q12H   feeding supplement (PRO-STAT SUGAR FREE 64)  30 mL Per Tube Daily   folic acid  1 mg Per Tube Daily   free water  200  mL Per Tube Q6H   insulin aspart  0-20 Units Subcutaneous Q4H   insulin aspart  7 Units Subcutaneous Q4H   insulin detemir  40 Units Subcutaneous BID   ipratropium-albuterol  3 mL Nebulization Q6H   isosorbide mononitrate  30 mg Oral Daily   mouth rinse  15 mL Mouth Rinse 10 times per day   metoprolol tartrate  37.5 mg Per NG tube BID   multivitamin with minerals  1 tablet Per Tube Daily   nystatin  5 mL Per Tube QID   pantoprazole (PROTONIX) IV  40 mg Intravenous Q12H   polyethylene glycol  17 g Per Tube Daily   pyridOXINE  100 mg Intravenous Daily   sodium chloride flush  10-40 mL Intracatheter Q12H   thiamine  100 mg Intravenous Daily   Continuous Infusions:  cefTRIAXone (ROCEPHIN)  IV 2 g (09/26/19 1400)   feeding supplement (OSMOLITE 1.5 CAL) 50 mL/hr at 09/26/19 1100   levETIRAcetam 1,500 mg (09/26/19 2212)   phenytoin (DILANTIN) IV 125 mg (09/27/19 0623)     LOS: 15 days   Time spent= 35 mins    Deneka Greenwalt Arsenio Loader, MD [facility]  If 7PM-7AM, please contact night-coverage  09/27/2019, 7:58 AM

## 2019-09-27 NOTE — Progress Notes (Addendum)
Patient become more tachypenic this after with RR in high 20s. Sat 100% on 4L [place]  Obtain CXR, Order Lasix 40 iv q8hrs x 3 doses. Hold Free water Infusion. Will Stop Tube feeds, reduced lantus to 20U BID. Stop Premean Insulin.  Cont bronchodilators.   Will continue to monitor.   Attempted to call both [name] and Eddie Dibbles- neither one answered.   Gerlean Ren MD

## 2019-09-28 DIAGNOSIS — Z515 Encounter for palliative care: Secondary | ICD-10-CM

## 2019-09-28 LAB — GLUCOSE, CAPILLARY: Glucose-Capillary: 147 mg/dL — ABNORMAL HIGH (ref 70–99)

## 2019-09-28 LAB — RESPIRATORY PANEL BY RT PCR (FLU A&B, COVID)
Influenza A by PCR: NEGATIVE
Influenza B by PCR: NEGATIVE
SARS Coronavirus 2 by RT PCR: NEGATIVE

## 2019-09-28 MED ORDER — MORPHINE SULFATE (PF) 4 MG/ML IV SOLN: 4.0000 mg | INTRAVENOUS | 0 refills | Status: AC | PRN

## 2019-09-28 MED ORDER — LORAZEPAM 2 MG/ML IJ SOLN: 2.0000 mg | INTRAMUSCULAR | 0 refills | Status: AC | PRN

## 2019-09-28 MED ORDER — LORAZEPAM 2 MG/ML IJ SOLN: 2.0000 mg | Freq: Four times a day (QID) | INTRAMUSCULAR | 0 refills | Status: AC

## 2019-09-28 MED ORDER — FENTANYL CITRATE (PF) 100 MCG/2ML IJ SOLN: 25.0000 ug | INTRAMUSCULAR | 0 refills | Status: AC | PRN

## 2019-09-28 NOTE — Discharge Summary (Signed)
Physician Discharge Summary  AZTLAN COLL XVQ:[id number] DOB: 1961/10/06 DOA: 09/12/2019  PCP: Celene Squibb, MD  Admit date: 09/12/2019 Discharge date: 09/28/2019  Admitted From: Home Disposition: Hospice  Recommendations for Outpatient Follow-up:  1. Residential hospice   Discharge Condition: Stable CODE STATUS: DNR Diet recommendation: Comfort feeding  Brief/Interim Summary: 58 year old with history of alcohol abuse, schizophrenia, diabetes mellitus type 2 presented from jail for being drunk and disorderly conduct any pain hospital on 11/25.  Found to be tachypneic and obtunded.  Concerns for metabolic acidosis, AKI and alkaline glycol toxicity therefore he was transferred to [facility].  Fomepizole was initially given which improved his acid-base disorder.  Imaging showed acute left MCA infarct.  Difficult to extubate due to persistent encephalopathy.  Seen by nephrology and neurology.  Found to have left lower extremity DVT therefore started on heparin drip.  Already on aspirin statin.  Concerns for tonic-clonic seizures therefore started on phenytoin and Keppra. Cortrak placed on 09/26/19.  Given patient's comorbidities, age and poor quality of life, palliative care team was consulted.  Discussed with his family and decided to transition him to comfort care with hospice.   Discharge Diagnoses:  Principal Problem:   Toxic metabolic encephalopathy Active Problems:   Diabetes mellitus type 2, controlled (HCC)   COPD (chronic obstructive pulmonary disease) (HCC)   Benzodiazepine abuse (HCC)   Opioid abuse ([place])   Diabetic neuropathy (HCC)   MDD (major depressive disorder), recurrent severe, without psychosis ([place])   Acute renal failure (ARF) ([place])   Pressure injury of skin   Cerebral embolism with cerebral infarction   Seizure [facility])   Palliative care by specialist   Goals of care, counseling/discussion   Advanced care planning/counseling discussion   Dyspnea    Tachypnea    Consultations:  Palliative care  Subjective: Patient remains unresponsive.  Does not follow any commands.  Discharge Exam: Vitals:   09/27/19 1500 09/28/19 0842  BP: 111/66 107/66  Pulse: (!) 109 (!) 115  Resp: (!) 27 (!) 27  Temp: 100 F (37.8 C) 100.3 F (37.9 C)  SpO2: 97% 93%   Vitals:   09/27/19 1330 09/27/19 1416 09/27/19 1500 09/28/19 0842  BP: 118/75  111/66 107/66  Pulse:  (!) 108 (!) 109 (!) 115  Resp: (!) 30 (!) 38 (!) 27 (!) 27  Temp:   100 F (37.8 C) 100.3 F (37.9 C)  TempSrc:   Axillary Axillary  SpO2: 100% 100% 97% 93%  Weight:      Height:        General: Unresponsive, chronically ill and frail appearing. Cardiovascular: RRR, S1/S2 +, no rubs, no gallops Respiratory: Diffuse coarse breath sounds Abdominal: Soft, NT, ND, bowel sounds + Extremities: no edema, no cyanosis Foley in place  Discharge Instructions   Allergies as of 09/28/2019      Reactions   Quetiapine Anaphylaxis   Seroquel [quetiapine Fumarate] Anxiety, Other (See Comments)   Reaction:  Nightmares    Trazodone Anaphylaxis   Trazodone And Nefazodone Anxiety, Other (See Comments)   Other reaction(s): Unknown Nightmares Reaction:  Nightmares    Vistaril [hydroxyzine Hcl] Nausea And Vomiting   Haldol [haloperidol Lactate] Anxiety, Other (See Comments)   Reaction:  Nightmares       Medication List    STOP taking these medications   aspirin EC 81 MG tablet   atorvastatin 40 MG tablet Commonly known as: LIPITOR   baclofen 10 MG tablet Commonly known as: IT sales professional KwikPen 100  UNIT/ML Sopn   budesonide-formoterol 80-4.5 MCG/ACT inhaler Commonly known as: SYMBICORT   cyclobenzaprine 10 MG tablet Commonly known as: FLEXERIL   hydrOXYzine 50 MG capsule Commonly known as: VISTARIL   isosorbide mononitrate 30 MG 24 hr tablet Commonly known as: IMDUR   Levemir 100 UNIT/ML injection Generic drug: insulin detemir   lisinopril 10 MG  tablet Commonly known as: ZESTRIL   metFORMIN 500 MG 24 hr tablet Commonly known as: GLUCOPHAGE-XR   metoprolol succinate 25 MG 24 hr tablet Commonly known as: TOPROL-XL   NovoLOG 100 UNIT/ML injection Generic drug: insulin aspart   risperiDONE 2 MG tablet Commonly known as: RISPERDAL     TAKE these medications   fentaNYL 100 MCG/2ML injection Commonly known as: SUBLIMAZE Inject 0.5-1 mLs (25-50 mcg total) into the vein every 2 (two) hours as needed for moderate pain.   LORazepam 2 MG/ML injection Commonly known as: ATIVAN Inject 1 mL (2 mg total) into the vein every 6 (six) hours.   LORazepam 2 MG/ML injection Commonly known as: ATIVAN Inject 1 mL (2 mg total) into the vein every 4 (four) hours as needed for anxiety or seizure (or SOB unrelieved with morphine).   morphine 4 MG/ML injection Inject 1 mL (4 mg total) into the vein every hour as needed for severe pain (or dyspnea, RR>25).      Follow-up Information    Celene Squibb, MD. Schedule an appointment as soon as possible for a visit in 1 week(s).   Specialty: Internal Medicine Contact information: [place] [address] [phone]          Allergies  Allergen Reactions  . Quetiapine Anaphylaxis  . Seroquel [Quetiapine Fumarate] Anxiety and Other (See Comments)    Reaction:  Nightmares   . Trazodone Anaphylaxis  . Trazodone And Nefazodone Anxiety and Other (See Comments)    Other reaction(s): Unknown Nightmares Reaction:  Nightmares   . Vistaril [Hydroxyzine Hcl] Nausea And Vomiting  . Haldol [Haloperidol Lactate] Anxiety and Other (See Comments)    Reaction:  Nightmares     You were cared for by a hospitalist during your hospital stay. If you have any questions about your discharge medications or the care you received while you were in the hospital after you are discharged, you can call the unit and asked to speak with the hospitalist on call if the hospitalist that took care of  you is not available. Once you are discharged, your primary care physician will handle any further medical issues. Please note that no refills for any discharge medications will be authorized once you are discharged, as it is imperative that you return to your primary care physician (or establish a relationship with a primary care physician if you do not have one) for your aftercare needs so that they can reassess your need for medications and monitor your lab values.   Procedures/Studies: EEG  Result Date: 09/16/2019 Greta Doom, MD     09/16/2019  8:36 PM History: 58 year old male with new onset seizure and left-sided strokes Sedation: None Technique: This is a 21 channel routine scalp EEG performed at the bedside with bipolar and monopolar montages arranged in accordance to the international 10/20 system of electrode placement. One channel was dedicated to EKG recording. Background: There is a posterior dominant rhythm of 8 Hz which is seen bilaterally, though much more prominent on the right than the left.  There is diffuse irregular slow activity which is much higher in amplitude on  the right, particularly in the frontal areas than on the left.  No epileptiform activity was seen. Photic stimulation: Physiologic driving is not performed EEG Abnormalities: 1) generalized irregular slow activity 2) attenuation of both high and low frequencies on the left 3) focal irregular slow activity most prominent in the right frontal region. Clinical Interpretation: This EEG recorded evidence of a generalized nonspecific cerebral dysfunction (encephalopathy) in addition to focal dysfunction.  Though the prominence of slow activity over the right hemisphere would be suggestive of a focal right sided dysfunction, my suspicion is that this represents a generalized dysfunction with attenuation of the frequencies on the left due to his known stroke.  There is also evidence of left-sided dysfunction in the  attenuation of the posterior dominant rhythm on that side. There was no seizure or seizure predisposition recorded on this study. Please note that lack of epileptiform activity on EEG does not preclude the possibility of epilepsy. Roland Rack, MD [facility] [phone] If 7pm- 7am, please page neurology on call as listed in [place].   CT ANGIO HEAD W OR WO CONTRAST  Result Date: 09/17/2019 CLINICAL DATA:  58 year old male with seizure, widespread abnormal diffusion imaging in the left hemisphere on MRI yesterday, but possibly postictal. EXAM: CT ANGIOGRAPHY HEAD AND NECK TECHNIQUE: Multidetector CT imaging of the head and neck was performed using the standard protocol during bolus administration of intravenous contrast. Multiplanar CT image reconstructions and MIPs were obtained to evaluate the vascular anatomy. Carotid stenosis measurements (when applicable) are obtained utilizing NASCET criteria, using the distal internal carotid diameter as the denominator. CONTRAST:  71m OMNIPAQUE IOHEXOL 350 MG/ML SOLN COMPARISON:  Brain MRI, MRA head and neck yesterday. Head CT 09/15/2019 and earlier. FINDINGS: CT HEAD Brain: Progressed patchy hypodensity in the left superior frontal gyrus since 09/15/2019 arguing in favor of cortical infarct rather than postictal changes. Similar mild increased hypodensity along the anterior left frontal lobe. No associated hemorrhage or mass effect. Elsewhere Gray-white matter differentiation is within normal limits throughout the brain. No ventriculomegaly. Normal basilar cisterns. Calvarium and skull base: No acute osseous abnormality identified. Paranasal sinuses: Stable mucosal thickening and scattered bubbly opacity. Mastoid air cells remain well pneumatized. Orbits: No acute orbit or scalp soft tissue finding. CTA NECK Skeleton: Scattered absent dentition. No acute osseous abnormality identified. Upper chest: Endotracheal tube terminates above the carina.  Trace retained secretions at the carina. Negative visible upper lungs. Enteric tube courses into the thoracic esophagus. No superior mediastinal lymphadenopathy. Other neck: Intubated with mild retained secretions in the pharynx. Oral enteric tube. No neck mass or lymphadenopathy. Aortic arch: 3 vessel arch configuration with soft and calcified arch atherosclerosis involving the great vessel origins. Right carotid system: No brachiocephalic origin stenosis despite plaque. Negative right CCA origin. Soft plaque in the right CCA proximal to the bifurcation without stenosis (series 10, image 118). At the bifurcation there is bulky calcified plaque in the posterior right ICA origin and bulb with associated high-grade stenosis at the origin (series 14, image 44. Diminutive downstream cervical right ICA such that the stenosis seems numerically underestimated at 50 % with respect to the distal vessel. The right ICA remains patent to the skull base. Left carotid system: No left CCA origin stenosis despite soft and calcified plaque. Additional soft plaque in the left CCA proximal to the bifurcation without stenosis. Comparatively mild plaque at the left ICA origin and bulb with no stenosis. But the cervical left ICA caliber is also diminutive similar to that on the right (  approximately 3 millimeters). The left ICA remains patent to the skull base. Vertebral arteries: No proximal right subclavian artery stenosis despite some plaque. Patent right vertebral artery origin, but subtotal occlusion of the right vertebral artery V1 and proximal V2 segments which are regular (series 13, image 107). The vessel has a more regular appearance by the distal V2 segment, but remains diminutive although patent to the skull base. No proximal left subclavian artery stenosis despite soft and calcified plaque. Normal left vertebral artery origin and normal caliber left vertebral artery is patent to the skull base without stenosis. There is mild  left V3 segment plaque. CTA HEAD Posterior circulation: Diminutive distal right vertebral artery terminates in PICA. No right V4 segment stenosis. The left vertebral supplies the basilar with mild left V4 plaque but no significant stenosis. Patent basilar artery without stenosis. Patent SCA and PCA origins. Posterior communicating arteries are diminutive or absent. Multifocal mild bilateral PCA irregularity without stenosis. Anterior circulation: Both ICA siphons are patent. On the left there is moderate calcified plaque with only mild stenosis. On the right there is mild to moderate soft and calcified plaque with mild stenosis in the cavernous segment. Patent but diminutive carotid termini. Normal MCA and ACA origins. Diminutive anterior communicating artery. Bilateral ACA branches are within normal limits. Left MCA M1 segment and left MCA trifurcation are patent without stenosis. No left MCA branch occlusion or stenosis identified. Right MCA M1 segment is diminutive but patent to the bifurcation without stenosis similar to that on the right. No right MCA branch occlusion or stenosis identified. Venous sinuses: Early contrast timing, but grossly patent. Anatomic variants: Non dominant right vertebral artery terminates in PICA. Review of the MIP images confirms the above findings IMPRESSION: 1. Negative for large vessel occlusion. Diminutive appearance of the intracranial circulation but no circle-of-Willis branch occlusion or significant intracranial stenosis identified. 2. Positive for severe stenosis vs. subtotal occlusion of the non-dominant proximal Right Vertebral Artery. This right vertebral terminates in PICA. 3. Positive also for high-grade Right ICA origin stenosis due to bulky calcified plaque, although the degree of this stenosis is numerically underestimated due to diminutive downstream right ICA (3 mm diameter). 4. The left ICA is similarly diminutive, but with no superimposed stenosis despite  atherosclerosis. 5. Progressive patchy hypodensity in the left frontal lobe since 09/15/2019 suggests ischemia rather than postictal changes on the MRI yesterday. No associated hemorrhage or mass effect. 6. No new intracranial abnormality. Electronically Signed   By: Genevie Ann M.D.   On: 09/17/2019 23:05   DG Abd 1 View  Result Date: 09/13/2019 CLINICAL DATA:  Ileus EXAM: ABDOMEN - 1 VIEW COMPARISON:  Chest radiograph 09/13/2019 FINDINGS: NG tube extends the stomach. Side port just below the GE junction. There is a large volume of stool throughout the colon. No dilated loops of small bowel are present. Gas appears in the rectum. IMPRESSION: 1. Large volume of stool throughout the colon suggests constipation. 2. No evidence of high-grade bowel obstruction. 3. NG tube in stomach. Electronically Signed   By: Suzy Bouchard M.D.   On: 09/13/2019 11:51   CT HEAD WO CONTRAST  Result Date: 09/16/2019 CLINICAL DATA:  58 year old male with seizure. EXAM: CT HEAD WITHOUT CONTRAST TECHNIQUE: Contiguous axial images were obtained from the base of the skull through the vertex without intravenous contrast. COMPARISON:  Head CT dated 09/12/2019. FINDINGS: Brain: The ventricles and sulci appropriate size for patient's age. Minimal periventricular and deep white matter chronic microvascular ischemic changes. Small hypodense area  in the left basal ganglia, age indeterminate, but new since the prior CT of 09/12/2019. Similarly there is an ill-defined area of hypodensity in the left frontal convexity (series 3 image 29). Findings concerning for an acute infarct. Further evaluation with MRI is recommended. There is no acute intracranial hemorrhage. No mass effect or midline shift. No extra-axial fluid collection. Vascular: No hyperdense vessel or unexpected calcification. Skull: Normal. Negative for fracture or focal lesion. Sinuses/Orbits: Diffuse mucoperiosteal thickening of paranasal sinuses. No air-fluid level. The mastoid  air cells are clear. Other: An endotracheal and an enteric tube are partially visualized. IMPRESSION: 1. No acute intracranial hemorrhage. 2. Ill-defined areas of hypodensity in the left frontal convexity and left basal ganglia, new since the prior CT and concerning for acute infarct. Further evaluation with MRI is recommended. These results were called by telephone at the time of interpretation on 09/16/2019 at 12:01 am to provider [facility] , who verbally acknowledged these results. Electronically Signed   By: Anner Crete M.D.   On: 09/16/2019 00:10   CT Head Wo Contrast  Result Date: 09/12/2019 CLINICAL DATA:  Altered level of consciousness EXAM: CT HEAD WITHOUT CONTRAST TECHNIQUE: Contiguous axial images were obtained from the base of the skull through the vertex without intravenous contrast. COMPARISON:  09/08/2019 FINDINGS: Brain: No evidence of acute infarction, hemorrhage, hydrocephalus, extra-axial collection or mass lesion/mass effect. Vascular: No hyperdense vessel or unexpected calcification. Skull: Normal. Negative for fracture or focal lesion. Sinuses/Orbits: Mild mucosal thickening is again seen in the ethmoid sinuses. Other: None. IMPRESSION: No acute abnormality is noted. The overall appearance is stable from the prior exam. Electronically Signed   By: Inez Catalina M.D.   On: 09/12/2019 20:17   CT ANGIO NECK W OR WO CONTRAST  Result Date: 09/17/2019 CLINICAL DATA:  58 year old male with seizure, widespread abnormal diffusion imaging in the left hemisphere on MRI yesterday, but possibly postictal. EXAM: CT ANGIOGRAPHY HEAD AND NECK TECHNIQUE: Multidetector CT imaging of the head and neck was performed using the standard protocol during bolus administration of intravenous contrast. Multiplanar CT image reconstructions and MIPs were obtained to evaluate the vascular anatomy. Carotid stenosis measurements (when applicable) are obtained utilizing NASCET criteria, using the distal  internal carotid diameter as the denominator. CONTRAST:  68m OMNIPAQUE IOHEXOL 350 MG/ML SOLN COMPARISON:  Brain MRI, MRA head and neck yesterday. Head CT 09/15/2019 and earlier. FINDINGS: CT HEAD Brain: Progressed patchy hypodensity in the left superior frontal gyrus since 09/15/2019 arguing in favor of cortical infarct rather than postictal changes. Similar mild increased hypodensity along the anterior left frontal lobe. No associated hemorrhage or mass effect. Elsewhere Gray-white matter differentiation is within normal limits throughout the brain. No ventriculomegaly. Normal basilar cisterns. Calvarium and skull base: No acute osseous abnormality identified. Paranasal sinuses: Stable mucosal thickening and scattered bubbly opacity. Mastoid air cells remain well pneumatized. Orbits: No acute orbit or scalp soft tissue finding. CTA NECK Skeleton: Scattered absent dentition. No acute osseous abnormality identified. Upper chest: Endotracheal tube terminates above the carina. Trace retained secretions at the carina. Negative visible upper lungs. Enteric tube courses into the thoracic esophagus. No superior mediastinal lymphadenopathy. Other neck: Intubated with mild retained secretions in the pharynx. Oral enteric tube. No neck mass or lymphadenopathy. Aortic arch: 3 vessel arch configuration with soft and calcified arch atherosclerosis involving the great vessel origins. Right carotid system: No brachiocephalic origin stenosis despite plaque. Negative right CCA origin. Soft plaque in the right CCA proximal to the bifurcation without stenosis (series  10, image 118). At the bifurcation there is bulky calcified plaque in the posterior right ICA origin and bulb with associated high-grade stenosis at the origin (series 14, image 44. Diminutive downstream cervical right ICA such that the stenosis seems numerically underestimated at 50 % with respect to the distal vessel. The right ICA remains patent to the skull base.  Left carotid system: No left CCA origin stenosis despite soft and calcified plaque. Additional soft plaque in the left CCA proximal to the bifurcation without stenosis. Comparatively mild plaque at the left ICA origin and bulb with no stenosis. But the cervical left ICA caliber is also diminutive similar to that on the right (approximately 3 millimeters). The left ICA remains patent to the skull base. Vertebral arteries: No proximal right subclavian artery stenosis despite some plaque. Patent right vertebral artery origin, but subtotal occlusion of the right vertebral artery V1 and proximal V2 segments which are regular (series 13, image 107). The vessel has a more regular appearance by the distal V2 segment, but remains diminutive although patent to the skull base. No proximal left subclavian artery stenosis despite soft and calcified plaque. Normal left vertebral artery origin and normal caliber left vertebral artery is patent to the skull base without stenosis. There is mild left V3 segment plaque. CTA HEAD Posterior circulation: Diminutive distal right vertebral artery terminates in PICA. No right V4 segment stenosis. The left vertebral supplies the basilar with mild left V4 plaque but no significant stenosis. Patent basilar artery without stenosis. Patent SCA and PCA origins. Posterior communicating arteries are diminutive or absent. Multifocal mild bilateral PCA irregularity without stenosis. Anterior circulation: Both ICA siphons are patent. On the left there is moderate calcified plaque with only mild stenosis. On the right there is mild to moderate soft and calcified plaque with mild stenosis in the cavernous segment. Patent but diminutive carotid termini. Normal MCA and ACA origins. Diminutive anterior communicating artery. Bilateral ACA branches are within normal limits. Left MCA M1 segment and left MCA trifurcation are patent without stenosis. No left MCA branch occlusion or stenosis identified. Right  MCA M1 segment is diminutive but patent to the bifurcation without stenosis similar to that on the right. No right MCA branch occlusion or stenosis identified. Venous sinuses: Early contrast timing, but grossly patent. Anatomic variants: Non dominant right vertebral artery terminates in PICA. Review of the MIP images confirms the above findings IMPRESSION: 1. Negative for large vessel occlusion. Diminutive appearance of the intracranial circulation but no circle-of-Willis branch occlusion or significant intracranial stenosis identified. 2. Positive for severe stenosis vs. subtotal occlusion of the non-dominant proximal Right Vertebral Artery. This right vertebral terminates in PICA. 3. Positive also for high-grade Right ICA origin stenosis due to bulky calcified plaque, although the degree of this stenosis is numerically underestimated due to diminutive downstream right ICA (3 mm diameter). 4. The left ICA is similarly diminutive, but with no superimposed stenosis despite atherosclerosis. 5. Progressive patchy hypodensity in the left frontal lobe since 09/15/2019 suggests ischemia rather than postictal changes on the MRI yesterday. No associated hemorrhage or mass effect. 6. No new intracranial abnormality. Electronically Signed   By: Genevie Ann M.D.   On: 09/17/2019 23:05   MR ANGIO HEAD WO CONTRAST  Result Date: 09/16/2019 CLINICAL DATA:  Possible left frontal stroke. Seizure. EXAM: MRI HEAD WITHOUT CONTRAST MRA HEAD WITHOUT CONTRAST MRA NECK WITHOUT CONTRAST TECHNIQUE: Multiplanar, multiecho pulse sequences of the brain and surrounding structures were obtained without intravenous contrast. Angiographic images of the Circle  of Willis were obtained using MRA technique without intravenous contrast. Angiographic images of the neck were obtained using MRA technique without intravenous contrast. Carotid stenosis measurements (when applicable) are obtained utilizing NASCET criteria, using the distal internal carotid  diameter as the denominator. COMPARISON:  None. FINDINGS: MRI HEAD FINDINGS BRAIN: The midline structures are normal. There is a large amount of diffusion restriction within the left hemisphere, predominantly subcortical but also affecting the left lentiform nucleus and ventromedial left thalamus. No contralateral or infratentorial diffusion abnormality. There is mild left frontal cortical edema. The CSF spaces are normal for age, with no hydrocephalus. Mild hemosiderin deposition within the right parietal lobe. SKULL AND UPPER CERVICAL SPINE: The visualized skull base, calvarium, upper cervical spine and extracranial soft tissues are normal. SINUSES/ORBITS: No fluid levels or advanced mucosal thickening. No mastoid or middle ear effusion. The orbits are normal. MRA HEAD FINDINGS Motion degraded POSTERIOR CIRCULATION: --Basilar artery: Normal. --Posterior cerebral arteries: Normal. Both originate from the basilar artery. --Superior cerebellar arteries: Normal. --Inferior cerebellar arteries: Normal anterior and posterior inferior cerebellar arteries. ANTERIOR CIRCULATION: --Intracranial internal carotid arteries: Normal. --Anterior cerebral arteries: Normal. Both A1 segments are present. Patent anterior communicating artery. --Middle cerebral arteries: Normal. --Posterior communicating arteries: Absent bilaterally. MRA NECK FINDINGS Motion degraded Aortic arch: Normal 3 vessel aortic branching pattern. The visualized subclavian arteries are normal. Right carotid system: Normal course and caliber without stenosis or evidence of dissection. Left carotid system: Normal course and caliber without stenosis or evidence of dissection. Vertebral arteries: Left dominant. Vertebral artery origins are not visualized. Visualized portions of the vertebral arteries are normal. The right vertebral artery terminates in PICA. IMPRESSION: 1. Large amount of abnormal diffusion restriction, likely postictal, within the left hemisphere,  predominantly cortical/subcortical. 2. Motion degraded MRA of the head and neck. No emergent large vessel occlusion or high-grade stenosis. Electronically Signed   By: Ulyses Jarred M.D.   On: 09/16/2019 05:32   MR ANGIO NECK WO CONTRAST  Result Date: 09/16/2019 CLINICAL DATA:  Possible left frontal stroke. Seizure. EXAM: MRI HEAD WITHOUT CONTRAST MRA HEAD WITHOUT CONTRAST MRA NECK WITHOUT CONTRAST TECHNIQUE: Multiplanar, multiecho pulse sequences of the brain and surrounding structures were obtained without intravenous contrast. Angiographic images of the Circle of Willis were obtained using MRA technique without intravenous contrast. Angiographic images of the neck were obtained using MRA technique without intravenous contrast. Carotid stenosis measurements (when applicable) are obtained utilizing NASCET criteria, using the distal internal carotid diameter as the denominator. COMPARISON:  None. FINDINGS: MRI HEAD FINDINGS BRAIN: The midline structures are normal. There is a large amount of diffusion restriction within the left hemisphere, predominantly subcortical but also affecting the left lentiform nucleus and ventromedial left thalamus. No contralateral or infratentorial diffusion abnormality. There is mild left frontal cortical edema. The CSF spaces are normal for age, with no hydrocephalus. Mild hemosiderin deposition within the right parietal lobe. SKULL AND UPPER CERVICAL SPINE: The visualized skull base, calvarium, upper cervical spine and extracranial soft tissues are normal. SINUSES/ORBITS: No fluid levels or advanced mucosal thickening. No mastoid or middle ear effusion. The orbits are normal. MRA HEAD FINDINGS Motion degraded POSTERIOR CIRCULATION: --Basilar artery: Normal. --Posterior cerebral arteries: Normal. Both originate from the basilar artery. --Superior cerebellar arteries: Normal. --Inferior cerebellar arteries: Normal anterior and posterior inferior cerebellar arteries. ANTERIOR  CIRCULATION: --Intracranial internal carotid arteries: Normal. --Anterior cerebral arteries: Normal. Both A1 segments are present. Patent anterior communicating artery. --Middle cerebral arteries: Normal. --Posterior communicating arteries: Absent bilaterally. MRA NECK FINDINGS Motion degraded  Aortic arch: Normal 3 vessel aortic branching pattern. The visualized subclavian arteries are normal. Right carotid system: Normal course and caliber without stenosis or evidence of dissection. Left carotid system: Normal course and caliber without stenosis or evidence of dissection. Vertebral arteries: Left dominant. Vertebral artery origins are not visualized. Visualized portions of the vertebral arteries are normal. The right vertebral artery terminates in PICA. IMPRESSION: 1. Large amount of abnormal diffusion restriction, likely postictal, within the left hemisphere, predominantly cortical/subcortical. 2. Motion degraded MRA of the head and neck. No emergent large vessel occlusion or high-grade stenosis. Electronically Signed   By: Ulyses Jarred M.D.   On: 09/16/2019 05:32   MR BRAIN WO CONTRAST  Result Date: 09/16/2019 CLINICAL DATA:  Possible left frontal stroke. Seizure. EXAM: MRI HEAD WITHOUT CONTRAST MRA HEAD WITHOUT CONTRAST MRA NECK WITHOUT CONTRAST TECHNIQUE: Multiplanar, multiecho pulse sequences of the brain and surrounding structures were obtained without intravenous contrast. Angiographic images of the Circle of Willis were obtained using MRA technique without intravenous contrast. Angiographic images of the neck were obtained using MRA technique without intravenous contrast. Carotid stenosis measurements (when applicable) are obtained utilizing NASCET criteria, using the distal internal carotid diameter as the denominator. COMPARISON:  None. FINDINGS: MRI HEAD FINDINGS BRAIN: The midline structures are normal. There is a large amount of diffusion restriction within the left hemisphere, predominantly  subcortical but also affecting the left lentiform nucleus and ventromedial left thalamus. No contralateral or infratentorial diffusion abnormality. There is mild left frontal cortical edema. The CSF spaces are normal for age, with no hydrocephalus. Mild hemosiderin deposition within the right parietal lobe. SKULL AND UPPER CERVICAL SPINE: The visualized skull base, calvarium, upper cervical spine and extracranial soft tissues are normal. SINUSES/ORBITS: No fluid levels or advanced mucosal thickening. No mastoid or middle ear effusion. The orbits are normal. MRA HEAD FINDINGS Motion degraded POSTERIOR CIRCULATION: --Basilar artery: Normal. --Posterior cerebral arteries: Normal. Both originate from the basilar artery. --Superior cerebellar arteries: Normal. --Inferior cerebellar arteries: Normal anterior and posterior inferior cerebellar arteries. ANTERIOR CIRCULATION: --Intracranial internal carotid arteries: Normal. --Anterior cerebral arteries: Normal. Both A1 segments are present. Patent anterior communicating artery. --Middle cerebral arteries: Normal. --Posterior communicating arteries: Absent bilaterally. MRA NECK FINDINGS Motion degraded Aortic arch: Normal 3 vessel aortic branching pattern. The visualized subclavian arteries are normal. Right carotid system: Normal course and caliber without stenosis or evidence of dissection. Left carotid system: Normal course and caliber without stenosis or evidence of dissection. Vertebral arteries: Left dominant. Vertebral artery origins are not visualized. Visualized portions of the vertebral arteries are normal. The right vertebral artery terminates in PICA. IMPRESSION: 1. Large amount of abnormal diffusion restriction, likely postictal, within the left hemisphere, predominantly cortical/subcortical. 2. Motion degraded MRA of the head and neck. No emergent large vessel occlusion or high-grade stenosis. Electronically Signed   By: Ulyses Jarred M.D.   On: 09/16/2019  05:32   DG CHEST PORT 1 VIEW  Result Date: 09/27/2019 CLINICAL DATA:  Tachypnea EXAM: PORTABLE CHEST 1 VIEW COMPARISON:  Radiograph 09/26/2019 FINDINGS: Transesophageal feeding tube tip terminates in the mid abdomen near the level of the gastric antrum. Right upper extremity PICC tip terminates at the superior cavoatrial junction. There are increasing opacities in the left lung base, now silhouetting portion of the left hemidiaphragm. Right lung remains fairly clear aside from low lung volumes with accentuated interstitial markings. No pneumothorax or visible effusion. IMPRESSION: 1. Transesophageal feeding tube tip terminates in the mid abdomen near the level of the gastric antrum. 2.  Right upper extremity PICC terminates at the superior cavoatrial junction. 3. Increasing left lower lobe airspace opacity, now silhouetting portion of the left hemidiaphragm. Could reflect consolidation or atelectasis. Electronically Signed   By: Lovena Le M.D.   On: 09/27/2019 16:04   DG CHEST PORT 1 VIEW  Result Date: 09/26/2019 CLINICAL DATA:  Dyspnea EXAM: PORTABLE CHEST 1 VIEW COMPARISON:  Yesterday FINDINGS: Feeding tube which at least reaches the pylorus. Right PICC with tip at the upper right atrium. Low volume chest with interstitial crowding but no Kerley lines, consolidation, effusion, or pneumothorax. Cardiac enlargement accentuated by low volumes IMPRESSION: Low volume chest without convincing pneumonia or edema. The asymmetric right lower lobe density on prior is not visualized today. Electronically Signed   By: Monte Fantasia M.D.   On: 09/26/2019 10:04   DG CHEST PORT 1 VIEW  Result Date: 09/25/2019 CLINICAL DATA:  Right lower lobe pneumonia. EXAM: PORTABLE CHEST 1 VIEW COMPARISON:  Radiograph 09/21/2019 FINDINGS: Interval extubation with slightly lower lung volumes. Streaky atelectasis at the left lung base and perihilar right lung have improved from prior. There is persistent patchy opacity in the  right lower lobe. Background interstitial coarsening which appears chronic. Unchanged heart size and mediastinal contours. No pneumothorax or large pleural effusion. IMPRESSION: 1. Persistent patchy opacity in the right lower lobe, suspicious for pneumonia, including aspiration. 2. Interval improvement in streaky atelectasis at the left lung base and perihilar right lung. 3. Slightly lower lung volumes after extubation. Electronically Signed   By: Keith Rake M.D.   On: 09/25/2019 06:19   DG CHEST PORT 1 VIEW  Result Date: 09/21/2019 CLINICAL DATA:  Respiratory failure, diabetes mellitus, hypertension, COPD, mi EXAM: PORTABLE CHEST 1 VIEW COMPARISON:  Portable exam 1638 hours compared to 0456 hours FINDINGS: Tip of endotracheal tube projects 5.1 cm above carina. Tip of RIGHT arm PICC line projects over SVC. Nasogastric tube extends into stomach. Normal heart size and mediastinal contours. Subsegmental atelectasis at LEFT base and at minor fissure Mild atelectasis at minor fissure. Progressive RIGHT lower lobe consolidation question pneumonia. Improved infiltrates throughout LEFT lung. No pleural effusion or pneumothorax. No pleural effusion or pneumothorax. IMPRESSION: Progressive consolidation RIGHT lower lobe suspicious for pneumonia. Scattered atelectasis. Electronically Signed   By: Lavonia Dana M.D.   On: 09/21/2019 16:52   DG Chest Port 1 View  Result Date: 09/21/2019 CLINICAL DATA:  Respiratory failure. EXAM: PORTABLE CHEST 1 VIEW COMPARISON:  One-view chest x-ray 09/18/2019 FINDINGS: Endotracheal tube is stable, 5.6 cm above the carina. Right-sided PICC line is in place. The heart size is normal. There is slight increase in a diffuse interstitial pattern. Mild bibasilar opacities are evident. No significant consolidation is present. IMPRESSION: 1. Slight increase in a diffuse interstitial pattern compatible with edema. 2. Mild bibasilar airspace disease likely reflects atelectasis. Infection is  considered less likely. 3. The support apparatus is stable. Electronically Signed   By: San Morelle M.D.   On: 09/21/2019 06:54   DG CHEST PORT 1 VIEW  Result Date: 09/18/2019 CLINICAL DATA:  Respiratory failure. EXAM: PORTABLE CHEST 1 VIEW COMPARISON:  One-view chest x-ray 09/13/2019 FINDINGS: The heart size is normal. Endotracheal tube terminates 5 cm above the carina, stable. Right-sided PICC line is stable. Lung volumes are low. There is no edema or effusion. No focal airspace disease is present. IMPRESSION: 1. No acute cardiopulmonary disease or significant interval change. 2. Stable support apparatus. 3. Low lung volumes. Electronically Signed   By: Wynetta Fines.D.  On: 09/18/2019 06:41   DG Chest Port 1 View  Result Date: 09/13/2019 CLINICAL DATA:  Intubation EXAM: PORTABLE CHEST 1 VIEW COMPARISON:  09/12/2019 FINDINGS: Unchanged position of endotracheal tube with tip at the level of the clavicular heads. Orogastric tube courses below the field of view. The lungs are clear. IMPRESSION: Unchanged position of endotracheal tube with tip at the level of the clavicular heads. Electronically Signed   By: Ulyses Jarred M.D.   On: 09/13/2019 05:38   DG Chest Portable 1 View  Result Date: 09/12/2019 CLINICAL DATA:  Intubation. Sepsis. EXAM: PORTABLE CHEST 1 VIEW 8:40 p.m. COMPARISON:  09/12/2019 at 2:09 p.m. FINDINGS: Endotracheal tube has been inserted and appears in good position 4.6 cm above the carina. NG tube tip is in the distal stomach. Heart size and pulmonary vascularity are normal. Lungs are clear. No effusions. No acute bone abnormality. IMPRESSION: Endotracheal tube appears in good position. Lungs are clear. Electronically Signed   By: Lorriane Shire M.D.   On: 09/12/2019 20:53   DG Chest Port 1 View  Result Date: 09/12/2019 CLINICAL DATA:  Tachypnea. EXAM: PORTABLE CHEST 1 VIEW COMPARISON:  September 08, 2019. FINDINGS: The heart size and mediastinal contours are  within normal limits. No pneumothorax is noted. Right lung is clear. Possible mild left lateral basilar opacity is noted which may represent atelectasis or infiltrate. The visualized skeletal structures are unremarkable. IMPRESSION: Possible mild left lateral basilar opacity is noted which may represent atelectasis or infiltrate. Followup radiographs are recommended until resolution. No other abnormality seen in the chest. Electronically Signed   By: Marijo Conception M.D.   On: 09/12/2019 14:19   EEG adult  Result Date: 09/19/2019 Lora Havens, MD     09/19/2019  9:55 AM Patient Name: [name] MRN: [id number] Epilepsy Attending: Lora Havens Referring Physician/Provider: Dr. Rosalin Hawking Date: 09/18/2019 Duration: 22.47 minutes Patient history: 58 year old male who presented with right-sided weakness, left gaze deviation and seizure.  EEG evaluate for seizures. Level of alertness: Awake AEDs during EEG study: Keppra Technical aspects: This EEG study was done with scalp electrodes positioned according to the 10-20 International system of electrode placement. Electrical activity was acquired at a sampling rate of _0  and reviewed with a high frequency filter of _1  and a low frequency filter of _2 . EEG data were recorded continuously and digitally stored. Description: During awake state, no clear posterior dominant rhythm was seen.  EEG showed continuous generalized and lateralized left hemisphere 3 to 5 Hz theta-delta slowing.  Hyperventilation photic stimulation not performed. Abnormality -Continuous slow, generalized and lateralized left hemisphere IMPRESSION: This study is suggestive of cortical dysfunction in left hemisphere likely secondary to underlying abnormality.  In addition there is evidence of moderate diffuse encephalopathy, nonspecific etiology. No seizures or epileptiform discharges were seen throughout the recording.   Overnight EEG with video  Result Date: 09/21/2019 Lora Havens, MD     09/22/2019  9:02 AM Patient Name: [name] MRN: [id number] Epilepsy Attending: Lora Havens Referring Physician/Provider: Dr. Rosalin Hawking Duration:  09/20/2019 1239 to 09/21/2019 1239  Patient history: 58 year old male who presented with right-sided weakness, left gaze deviation and seizure.  EEG evaluate for seizures.  Level of alertness: Awake, asleep  AEDs during EEG study: Keppra  Technical aspects: This EEG study was done with scalp electrodes positioned according to the 10-20 International system of electrode placement. Electrical activity was acquired at a sampling rate of _3  and reviewed with a high  frequency filter of _0  and a low frequency filter of _1 . EEG data were recorded continuously and digitally stored.  Description: During awake state, no clear posterior dominant rhythm was seen. Sleep was characterized by sleep spindles (12-_2 ), maximal frontocentral. EEG showed continuous generalized and lateralized left hemisphere 3 to 5 Hz theta-delta slowing.  Hyperventilation photic stimulation not performed. Event button was pressed on 09/20/2019 at 2237. On video, patient was noted to have forced right gaze deviation.  Concomitant EEG before during and after the event showed rhythmic delta 2 to 3 Hz delta slowing with overriding 13 to 15 Hz beta activity in left hemisphere which then evolved to involve right hemisphere as well.  Multiple seizures with no clinical signs were noted with similar EEG findings as described earlier.  Abnormality -Seizures, left hemisphere -Continuous slow, generalized and lateralized left hemisphere  IMPRESSION: This study showed frequent seizures arising from left hemisphere.  During one of the seizures when event button was pressed on 09/20/2019 at 2237, patient was noted to have forced right gaze deviation.  However other seizures were subclinical/ without any definite clinical manifestations.  Additionally, there was evidence of cortical  dysfunction in left hemisphere likely secondary to underlying abnormality as well as moderate diffuse encephalopathy, nonspecific etiology. Priyanka O Yadav   VAS [place] TRANSCRANIAL DOPPLER W BUBBLES  Result Date: 09/18/2019  Transcranial Doppler with Bubble Indications: Stroke. Comparison Study: No prior study. Performing Technologist: Maudry Mayhew MHA, RDMS, RVT, RDCS  Examination Guidelines: A complete evaluation includes B-mode imaging, spectral Doppler, color Doppler, and power Doppler as needed of all accessible portions of each vessel. Bilateral testing is considered an integral part of a complete examination. Limited examinations for reoccurring indications may be performed as noted.  Summary:  A vascular evaluation was performed. The right middle cerebral artery was studied. An IV was inserted into the patient's left forearm. Verbal informed consent was obtained.  No obvious evidence of high intensity transient signals (HITS), therefore no evidence of clinically significant patent foramen ovale (PFO). Negative TCD Bubble study *See table(s) above for measurements and observations.  Diagnosing physician: Antony Contras MD Electronically signed by Antony Contras MD on 09/18/2019 at 8:15:25 AM.    Final    ECHOCARDIOGRAM COMPLETE  Result Date: 09/16/2019   ECHOCARDIOGRAM REPORT   Patient Name:   [name] Date of Exam: 09/16/2019 Medical Rec #:  [id number]     Height:       72.0 in Accession #:    [id number]    Weight:       219.1 lb Date of Birth:  06-01-1961     BSA:          2.21 m Patient Age:    48 years      BP:           152/83 mmHg Patient Gender: M             HR:           93 bpm. Exam Location:  Inpatient Procedure: 2D Echo Indications:    Stroke 434.91 / I163.9  History:        Patient has no prior history of Echocardiogram examinations.                 COPD; Risk Factors:Hypertension, Diabetes, Dyslipidemia and                 Current Smoker. Schizoaffective disorder, depressive type,  Acute  Renal Failure, ETOH, Opiod Anuse, Benzodiazepine abuse , Cardiac                 Arrest.  Sonographer:    Leavy Cella Referring Phys: [id number] JINDONG XU IMPRESSIONS  1. Left ventricular ejection fraction, by visual estimation, is 60 to 65%. The left ventricle has normal function. Left ventricular septal wall thickness was mildly increased. Mildly increased left ventricular posterior wall thickness. There is mildly increased left ventricular hypertrophy.  2. Global right ventricle has normal systolic function.The right ventricular size is normal. No increase in right ventricular wall thickness.  3. Left atrial size was normal.  4. Right atrial size was normal.  5. The mitral valve is normal in structure. Trace mitral valve regurgitation. No evidence of mitral stenosis.  6. The tricuspid valve is normal in structure. Tricuspid valve regurgitation is trivial.  7. The aortic valve is normal in structure. Aortic valve regurgitation is not visualized. No evidence of aortic valve sclerosis or stenosis.  8. The pulmonic valve was normal in structure. Pulmonic valve regurgitation is not visualized.  9. Normal pulmonary artery systolic pressure. 10. The inferior vena cava is normal in size with greater than 50% respiratory variability, suggesting right atrial pressure of 3 mmHg. FINDINGS  Left Ventricle: Left ventricular ejection fraction, by visual estimation, is 60 to 65%. The left ventricle has normal function. Mildly increased left ventricular posterior wall thickness. There is mildly increased left ventricular hypertrophy. Left ventricular diastolic parameters are consistent with age-related delayed relaxation (normal). Indeterminate filling pressures. Right Ventricle: The right ventricular size is normal. No increase in right ventricular wall thickness. Global RV systolic function is has normal systolic function. The tricuspid regurgitant velocity is 1.66 m/s, and with an assumed right atrial  pressure  of 3 mmHg, the estimated right ventricular systolic pressure is normal at 14.0 mmHg. Left Atrium: Left atrial size was normal in size. Right Atrium: Right atrial size was normal in size Pericardium: There is no evidence of pericardial effusion. Mitral Valve: The mitral valve is normal in structure. No evidence of mitral valve stenosis by observation. Trace mitral valve regurgitation. Tricuspid Valve: The tricuspid valve is normal in structure. Tricuspid valve regurgitation is trivial. Aortic Valve: The aortic valve is normal in structure. Aortic valve regurgitation is not visualized. The aortic valve is structurally normal, with no evidence of sclerosis or stenosis. Pulmonic Valve: The pulmonic valve was normal in structure. Pulmonic valve regurgitation is not visualized. Aorta: The aortic root, ascending aorta and aortic arch are all structurally normal, with no evidence of dilitation or obstruction. Venous: The inferior vena cava is normal in size with greater than 50% respiratory variability, suggesting right atrial pressure of 3 mmHg. IAS/Shunts: No atrial level shunt detected by color flow Doppler. No ventricular septal defect is seen or detected. There is no evidence of an atrial septal defect.  LEFT VENTRICLE PLAX 2D LVIDd:         4.10 cm  Diastology LVIDs:         2.50 cm  LV e' lateral:   8.19 cm/s LV PW:         1.35 cm  LV E/e' lateral: 7.9 LV IVS:        1.30 cm  LV e' medial:    5.33 cm/s LVOT diam:     2.00 cm  LV E/e' medial:  12.1 LV SV:         52 ml LV SV Index:   22.87 LVOT Area:  3.14 cm  RIGHT VENTRICLE RV S prime:     12.50 cm/s TAPSE (M-mode): 1.7 cm LEFT ATRIUM           Index       RIGHT ATRIUM           Index LA diam:      3.90 cm 1.76 cm/m  RA Area:     12.10 cm LA Vol (A2C): 26.2 ml 11.83 ml/m RA Volume:   25.40 ml  11.47 ml/m LA Vol (A4C): 37.4 ml 16.89 ml/m   AORTA Ao Root diam: 2.70 cm MITRAL VALVE                        TRICUSPID VALVE MV Area (PHT): 4.21 cm              TR Peak grad:   11.0 mmHg MV PHT:        52.20 msec           TR Vmax:        166.00 cm/s MV Decel Time: 180 msec MV E velocity: 64.50 cm/s 103 cm/s  SHUNTS MV A velocity: 77.70 cm/s 70.3 cm/s Systemic Diam: 2.00 cm MV E/A ratio:  0.83       1.5  Skeet Latch MD Electronically signed by Skeet Latch MD Signature Date/Time: 09/16/2019/4:00:15 PM    Final    VAS [place] LOWER EXTREMITY VENOUS (DVT)  Result Date: 09/17/2019  Lower Venous Study Indications: Stroke.  Comparison Study: No prior study. Performing Technologist: Maudry Mayhew MHA, RDMS, RVT, RDCS  Examination Guidelines: A complete evaluation includes B-mode imaging, spectral Doppler, color Doppler, and power Doppler as needed of all accessible portions of each vessel. Bilateral testing is considered an integral part of a complete examination. Limited examinations for reoccurring indications may be performed as noted.  +---------+---------------+---------+-----------+----------+--------------+ RIGHT    CompressibilityPhasicitySpontaneityPropertiesThrombus Aging +---------+---------------+---------+-----------+----------+--------------+ CFV      Full           Yes      Yes                                 +---------+---------------+---------+-----------+----------+--------------+ SFJ      Full                                                        +---------+---------------+---------+-----------+----------+--------------+ FV Prox  Full                                                        +---------+---------------+---------+-----------+----------+--------------+ FV Mid   Full                                                        +---------+---------------+---------+-----------+----------+--------------+ FV DistalFull                                                        +---------+---------------+---------+-----------+----------+--------------+  PFV      Full                                                         +---------+---------------+---------+-----------+----------+--------------+ POP      Full           Yes      Yes                                 +---------+---------------+---------+-----------+----------+--------------+ PTV      Full                                                        +---------+---------------+---------+-----------+----------+--------------+ PERO     Full                                                        +---------+---------------+---------+-----------+----------+--------------+  +---------+---------------+---------+-----------+----------+--------------+ LEFT     CompressibilityPhasicitySpontaneityPropertiesThrombus Aging +---------+---------------+---------+-----------+----------+--------------+ CFV      Partial        Yes      Yes                  Acute          +---------+---------------+---------+-----------+----------+--------------+ SFJ      Full                                                        +---------+---------------+---------+-----------+----------+--------------+ FV Prox  Full                                                        +---------+---------------+---------+-----------+----------+--------------+ FV Mid   Full                                                        +---------+---------------+---------+-----------+----------+--------------+ FV DistalFull                                                        +---------+---------------+---------+-----------+----------+--------------+ PFV      None                                         Acute          +---------+---------------+---------+-----------+----------+--------------+  POP      None                                         Acute          +---------+---------------+---------+-----------+----------+--------------+ PTV      Full                    Yes                                  +---------+---------------+---------+-----------+----------+--------------+ PERO     None                    No                   Acute          +---------+---------------+---------+-----------+----------+--------------+  Summary: Right: There is no evidence of deep vein thrombosis in the lower extremity. No cystic structure found in the popliteal fossa. Left: Findings consistent with acute deep vein thrombosis involving the left peroneal veins, left common femoral vein, left proximal profunda vein, and left popliteal vein. No cystic structure found in the popliteal fossa.  *See table(s) above for measurements and observations. Electronically signed by Ruta Hinds MD on 09/17/2019 at 3:47:18 PM.    Final    [place] EKG SITE RITE  Result Date: 09/16/2019 If Site Rite image not attached, placement could not be confirmed due to current cardiac rhythm.  US Abdomen Limited RUQ  Result Date: 09/13/2019 CLINICAL DATA:  Acute respiratory failure.  ETOH abuse. EXAM: ULTRASOUND ABDOMEN LIMITED RIGHT UPPER QUADRANT COMPARISON:  02/17/2015 FINDINGS: Gallbladder: No gallstones or wall thickening visualized. No sonographic Murphy sign noted by sonographer. Common bile duct: Diameter: 5.5 mm. Liver: No focal lesion identified. Within normal limits in parenchymal echogenicity. Portal vein is patent on color Doppler imaging with normal direction of blood flow towards the liver. Other: None. IMPRESSION: No acute hepatobiliary findings. Electronically Signed   By: Marin Olp M.D.   On: 09/13/2019 09:03     The results of significant diagnostics from this hospitalization (including imaging, microbiology, ancillary and laboratory) are listed below for reference.     Microbiology: Recent Results (from the past 240 hour(s))  Culture, bal-quantitative     Status: Abnormal   Collection Time: 09/21/19  3:39 PM   Specimen: Bronchoalveolar Lavage; Respiratory  Result Value Ref Range Status   Specimen Description  BRONCHIAL ALVEOLAR LAVAGE  Final   Special Requests NONE  Final   Gram Stain   Final    FEW WBC PRESENT, PREDOMINANTLY PMN FEW GRAM POSITIVE COCCI IN PAIRS FEW GRAM NEGATIVE RODS    Culture (A)  Final    >=100,000 COLONIES/mL Consistent with normal respiratory flora. Performed at [facility], [place] [address] [place], [place] [address]    Report Status 09/23/2019 FINAL  Final  Culture, respiratory (non-expectorated)     Status: None   Collection Time: 09/22/19  8:04 AM   Specimen: Tracheal Aspirate; Respiratory  Result Value Ref Range Status   Specimen Description TRACHEAL ASPIRATE  Final   Special Requests Normal  Final   Gram Stain   Final    RARE WBC PRESENT, PREDOMINANTLY PMN FEW GRAM NEGATIVE RODS RARE GRAM POSITIVE COCCI IN CLUSTERS Performed at [facility], [address] [place], [place] [address]    Culture FEW ESCHERICHIA COLI  Final   Report Status 09/24/2019 FINAL  Final   Organism ID, Bacteria ESCHERICHIA COLI  Final      Susceptibility   Escherichia coli - MIC*    AMPICILLIN >=32 RESISTANT Resistant     CEFAZOLIN 32 INTERMEDIATE Intermediate     CEFEPIME <=1 SENSITIVE Sensitive     CEFTAZIDIME <=1 SENSITIVE Sensitive     CEFTRIAXONE <=1 SENSITIVE Sensitive     CIPROFLOXACIN <=0.25 SENSITIVE Sensitive     GENTAMICIN <=1 SENSITIVE Sensitive     IMIPENEM <=0.25 SENSITIVE Sensitive     TRIMETH/SULFA <=20 SENSITIVE Sensitive     AMPICILLIN/SULBACTAM >=32 RESISTANT Resistant     PIP/TAZO 64 INTERMEDIATE Intermediate     Extended ESBL NEGATIVE Sensitive     * FEW ESCHERICHIA COLI     Labs: BNP (last 3 results) Recent Labs    09/26/19 0508  BNP 56.3   Basic Metabolic Panel: Recent Labs  Lab 09/23/19 0447 09/24/19 0422 09/25/19 0305 09/26/19 0506 09/27/19 0431  NA 148* 152* 152* 154* 155*  K 4.4 4.3 3.9 3.9 4.3  CL 113* 117* 118* 118* 120*  CO2 _0 GLUCOSE 194* 86 106* 179* 181*  BUN 49* 41* 35* 40* 41*  CREATININE 1.38*  1.37* 1.32* 1.45* 1.49*  CALCIUM 9.0 9.4 9.0 9.4 9.1  MG  --   --  2.0 2.1 2.1  PHOS 4.5 5.5* 5.2* 4.2 4.9*   Liver Function Tests: Recent Labs  Lab 09/23/19 0447 09/24/19 0422 09/25/19 0305 09/26/19 0506 09/27/19 0431  ALBUMIN 2.1* 2.1* 2.0* 2.0* 2.1*   No results for input(s): LIPASE, AMYLASE in the last 168 hours. No results for input(s): AMMONIA in the last 168 hours. CBC: Recent Labs  Lab 09/22/19 0927 09/23/19 0447 09/24/19 0422 09/25/19 0305 09/26/19 0507 09/27/19 0431  WBC 9.7 8.9 10.2 7.3 7.9 8.9  NEUTROABS 7.3 7.0 8.1*  --   --   --   HGB 9.7* 9.2* 9.9* 10.8* 10.5* 9.9*  HCT 30.9* 29.7* 31.5* 33.3* 33.6* 31.8*  MCV 98.1 98.0 98.1 94.6 97.7 98.8  PLT 209 222 239 219 248 245   Cardiac Enzymes: No results for input(s): CKTOTAL, CKMB, CKMBINDEX, TROPONINI in the last 168 hours. BNP: Invalid input(s): POCBNP CBG: Recent Labs  Lab 09/26/19 2314 09/27/19 0331 09/27/19 0824 09/27/19 1140 09/27/19 1615  GLUCAP 176* 171* 147* 167* 126*   D-Dimer No results for input(s): DDIMER in the last 72 hours. Hgb A1c No results for input(s): HGBA1C in the last 72 hours. Lipid Profile No results for input(s): CHOL, HDL, LDLCALC, TRIG, CHOLHDL, LDLDIRECT in the last 72 hours. Thyroid function studies No results for input(s): TSH, T4TOTAL, T3FREE, THYROIDAB in the last 72 hours.  Invalid input(s): FREET3 Anemia work up No results for input(s): VITAMINB12, FOLATE, FERRITIN, TIBC, IRON, RETICCTPCT in the last 72 hours. Urinalysis    Component Value Date/Time   COLORURINE YELLOW 09/12/2019 1325   APPEARANCEUR CLEAR 09/12/2019 1325   LABSPEC 1.018 09/12/2019 1325   PHURINE 5.0 09/12/2019 1325   GLUCOSEU NEGATIVE 09/12/2019 1325   HGBUR NEGATIVE 09/12/2019 1325   BILIRUBINUR NEGATIVE 09/12/2019 1325   BILIRUBINUR neg 01/02/2016 1526   KETONESUR 20 (A) 09/12/2019 1325   PROTEINUR 100 (A) 09/12/2019 1325   UROBILINOGEN 0.2 01/02/2016 1526   UROBILINOGEN 0.2  07/27/2015 0322   NITRITE NEGATIVE 09/12/2019 1325   LEUKOCYTESUR NEGATIVE 09/12/2019 1325   Sepsis  Labs Invalid input(s): PROCALCITONIN,  WBC,  LACTICIDVEN Microbiology Recent Results (from the past 240 hour(s))  Culture, bal-quantitative     Status: Abnormal   Collection Time: 09/21/19  3:39 PM   Specimen: Bronchoalveolar Lavage; Respiratory  Result Value Ref Range Status   Specimen Description BRONCHIAL ALVEOLAR LAVAGE  Final   Special Requests NONE  Final   Gram Stain   Final    FEW WBC PRESENT, PREDOMINANTLY PMN FEW GRAM POSITIVE COCCI IN PAIRS FEW GRAM NEGATIVE RODS    Culture (A)  Final    >=100,000 COLONIES/mL Consistent with normal respiratory flora. Performed at [facility], [place] [address] [place], [place] [address]    Report Status 09/23/2019 FINAL  Final  Culture, respiratory (non-expectorated)     Status: None   Collection Time: 09/22/19  8:04 AM   Specimen: Tracheal Aspirate; Respiratory  Result Value Ref Range Status   Specimen Description TRACHEAL ASPIRATE  Final   Special Requests Normal  Final   Gram Stain   Final    RARE WBC PRESENT, PREDOMINANTLY PMN FEW GRAM NEGATIVE RODS RARE GRAM POSITIVE COCCI IN CLUSTERS Performed at [facility], [place] [address] [place], [place] [address]    Culture FEW ESCHERICHIA COLI  Final   Report Status 09/24/2019 FINAL  Final   Organism ID, Bacteria ESCHERICHIA COLI  Final      Susceptibility   Escherichia coli - MIC*    AMPICILLIN >=32 RESISTANT Resistant     CEFAZOLIN 32 INTERMEDIATE Intermediate     CEFEPIME <=1 SENSITIVE Sensitive     CEFTAZIDIME <=1 SENSITIVE Sensitive     CEFTRIAXONE <=1 SENSITIVE Sensitive     CIPROFLOXACIN <=0.25 SENSITIVE Sensitive     GENTAMICIN <=1 SENSITIVE Sensitive     IMIPENEM <=0.25 SENSITIVE Sensitive     TRIMETH/SULFA <=20 SENSITIVE Sensitive     AMPICILLIN/SULBACTAM >=32 RESISTANT Resistant     PIP/TAZO 64 INTERMEDIATE Intermediate     Extended ESBL NEGATIVE  Sensitive     * FEW ESCHERICHIA COLI     Time coordinating discharge:  I have spent 35 minutes face to face with the patient and on the ward discussing the patients care, assessment, plan and disposition with other care givers. >50% of the time was devoted counseling the patient about the risks and benefits of treatment/Discharge disposition and coordinating care.   SIGNED:   Damita Lack, MD  [facility] 09/28/2019, 10:31 AM   If 7PM-7AM, please contact night-coverage

## 2019-09-28 NOTE — TOC Transition Note (Signed)
Transition of Care [facility]) - CM/SW Discharge Note   Patient Details  Name: [name] MRN: [id number] Date of Birth: [birth year]  [facility]) CM/SW Contact:  Sharin Mons, RN Phone Number: 09/28/2019, 4:30 PM   Clinical Narrative:    Patient will DC to: [facility] Anticipated DC date: 09/28/2019 Family notified: Fritz Pickerel ( uncle) Transport by: Corey Harold   Per MD patient ready for DC today . RN, patient, patient's family, and facility notified of DC. Discharge Summary sent to facility. RN to call report prior to discharge ([phone]). DC packet on chart. Ambulance transport requested for patient.   NCM will sign off for now as intervention is no longer needed. Please consult [place] again if new needs arise.   Final next level of care: [place] Barriers to Discharge: No Barriers Identified   Patient Goals and CMS Choice        Discharge Placement                       Discharge Plan and Services                                     Social Determinants of Health (SDOH) Interventions     Readmission Risk Interventions No flowsheet data found.

## 2019-09-28 NOTE — Progress Notes (Signed)
Daily Progress Note   Patient Name: [name]       Date: 09/28/2019 DOB: 11-20-1960  Age: 58 y.o. MRN#: [id number] Attending Physician: Damita Lack, MD Primary Care Physician: Celene Squibb, MD Admit Date: 09/12/2019  Reason for Consultation/Follow-up: Establishing goals of care  Subjective: Patient in bed, unresponsive, tachypneic. Discussed with RN- morphine does help at current dose.  Awaiting COVID test and release from [facility] police for Hospice placement.   Review of Systems  Unable to perform ROS: Mental status change    Length of Stay: 16  Current Medications: Scheduled Meds:  . bacitracin   Topical Daily  . chlorhexidine gluconate (MEDLINE KIT)  15 mL Mouth Rinse BID  . Chlorhexidine Gluconate Cloth  6 each Topical Q0600  . LORazepam  2 mg Intravenous Q6H  . mouth rinse  15 mL Mouth Rinse 10 times per day    Continuous Infusions: . levETIRAcetam 1,500 mg (09/28/19 0947)    PRN Meds: acetaminophen, antiseptic oral rinse, fentaNYL (SUBLIMAZE) injection, glycopyrrolate **OR** glycopyrrolate **OR** glycopyrrolate, LORazepam, morphine injection, ondansetron **OR** ondansetron (ZOFRAN) IV, polyvinyl alcohol  Physical Exam Vitals and nursing note reviewed.  Cardiovascular:     Rate and Rhythm: Tachycardia present.  Pulmonary:     Comments: tahchypneic             Vital Signs: BP 107/66   Pulse (!) 115   Temp 100.3 F (37.9 C) (Axillary)   Resp (!) 27   Ht 6' (1.829 m)   Wt 101.2 kg   SpO2 93%   BMI 30.26 kg/m  SpO2: SpO2: 93 % O2 Device: O2 Device: Room Air O2 Flow Rate: O2 Flow Rate (L/min): 5 L/min  Intake/output summary:   Intake/Output Summary (Last 24 hours) at 09/28/2019 1433 Last data filed at 09/28/2019 0514 Gross per 24 hour    Intake 1000 ml  Output 800 ml  Net 200 ml   LBM: Last BM Date: 09/24/19 Baseline Weight: Weight: 98.9 kg Most recent weight: Weight: 101.2 kg       Palliative Assessment/Data: PPS: 10%      Patient Active Problem List   Diagnosis Date Noted  . Dyspnea   . Tachypnea   . Palliative care by specialist   . Goals of care, counseling/discussion   . Advanced care planning/counseling discussion   .  Seizure ([place]) 09/25/2019  . Toxic metabolic encephalopathy   . Cerebral embolism with cerebral infarction 09/16/2019  . Pressure injury of skin 09/13/2019  . Acute renal failure (ARF) ([place]) 09/12/2019  . Poisoning, antifreeze   . Suicide attempt ([place])   . Overdose 04/14/2018  . MDD (major depressive disorder) 11/12/2017  . MDD (major depressive disorder), recurrent severe, without psychosis ([place]) 10/12/2017  . Diabetic neuropathy ([place]) 11/11/2016  . Anxiety and depression 11/11/2016  . Chronic insomnia 01/08/2016  . Alcohol use disorder 12/10/2015  . Benzodiazepine abuse ([place]) 12/10/2015  . Opioid abuse ([place]) 12/10/2015  . Constipation 02/06/2015  . BPH (benign prostatic hyperplasia) 01/16/2015  . Vitamin D deficiency 01/16/2015  . Allergic rhinitis 11/19/2014  . Chronic low back pain 11/07/2014  . Onychomycosis of toenail 11/07/2014  . Tinea pedis 11/07/2014  . Psoriasis 10/14/2014  . HTN (hypertension) 10/14/2014  . Diabetes mellitus type 2, controlled ([place]) 10/14/2014  . COPD (chronic obstructive pulmonary disease) ([place]) 10/14/2014  . Tobacco abuse 10/14/2014  . HLD (hyperlipidemia) 10/14/2014  . Schizoaffective disorder, depressive type ([place]) 10/14/2014    Palliative Care Assessment & Plan   Patient Profile: 58 y.o.malewith past medical history of plysubstance use, paranoid schizophrenia, COPD, DM, HTN, CADadmitted on11/25/2020with acute encephalopathy, suicide attempts, R sided weakness, seizure.Workup reveals ETOH toxicity, metabolic acidosis, acute renal  failure with uremia and likely sepsis as well as multiple acute embolic infarcts, and acute DVT in the left lower extremity. EEG on 12/4 showed frequent seizures. He was intubated 11/26-12/6 and has been extubated. Chest xray showing progressive Right lower lobe suspicion for pneumonia. Remains with coretrak feeding tube in place. He is nonverbal. SLP eval notes significant dysphagia- recommendation for NPO status.  Assessment/Recommendations/Plan   Continue current comfort measures  Awaiting placement at residential hospice  Goals of Care and Additional Recommendations:  Limitations on Scope of Treatment: Full Comfort Care  Code Status:  DNR  Prognosis:   < 2 weeks  Discharge Planning:  Hospice facility  Care plan was discussed with patient's RN.  Thank you for allowing the Palliative Medicine Team to assist in the care of this patient.   Time In: 1400 Time Out: 1425 Total Time 25 mins Prolonged Time Billed no      Greater than 50%  of this time was spent counseling and coordinating care related to the above assessment and plan.  Mariana Kaufman, AGNP-C Palliative Medicine   Please contact Palliative Medicine Team phone at [phone] for questions and concerns.

## 2019-09-28 NOTE — TOC Progression Note (Addendum)
Transition of Care [facility]) - Progression Note    Patient Details  Name: [name] MRN: [id number] Date of Birth: [birth year]  [facility]) CM/SW Contact  Sharin Mons, RN Phone Number: [phone] 09/28/2019, 8:40 AM  Clinical Narrative:    NCM received consult for residential hospice. NCM called Fritz Pickerel (uncle) @ [phone] and confirmed d/c plan : residential hospice care. Fritz Pickerel stated family would like referral to be made with [facility]. Referral made with [facility]. NCM spoke with admission liaison and referral faxed via [place]. Awaiting approval, liaison to f/u with NCM. Whitman Hero RN,BSN,CM    09/28/2019 10:15 am Updated COVID needed, MD made aware. Hospice admission liaison awaiting information form police department 2/2 pt's recent jail visit. Liaison to  f/u with NCM, still awaiting approval.  Expected Discharge Plan: [place] Barriers to Discharge: Other (comment)(referral made with [facility], awaiting approval)  Expected Discharge Plan and Services Expected Discharge Plan: [place]         Expected Discharge Date: 09/28/19                                     Social Determinants of Health (SDOH) Interventions    Readmission Risk Interventions No flowsheet data found.

## 2019-09-28 NOTE — Progress Notes (Addendum)
Updated COVID resulted @ [id number]. NCM reached out to [place] via voice message, voice message left. Given  COVID resulted time and liaison unavailable after 4pm to assist family signing pt,  TOC team will f/u with [facility] in the am with pt's d/c to [facility]. Whit[place] Hero RN, BSN,CM

## 2019-09-28 NOTE — Progress Notes (Addendum)
NCM received call from admission [facility] liaison. Liaison informed NCM they will be able to received pt today. NCM made nurse and MD aware. COVID results faxed to liaison. Whitman Hero RN,BSN,CM

## 2019-09-28 NOTE — Progress Notes (Signed)
Still, updated COVID pending.... NCM spoke with hospice liaison @ [place]. Liaison stated family has to sign pt in the day of admission. Liaison stated she will be unavailable for pt's family to sign in after 4pm. TOC  Team will continue monitoring. Whit[place] Hero RN,BSN,CM

## 2019-10-09 ENCOUNTER — Ambulatory Visit: Payer: Medicare Other | Admitting: Family Medicine

## 2019-10-19 DEATH — deceased
